# Patient Record
Sex: Female | Born: 1985 | Race: Black or African American | Hispanic: No | Marital: Single | Smoking: Never smoker
Health system: Southern US, Community
[De-identification: ages and names within clinical notes are randomized; demographics above are authoritative.]

## PROBLEM LIST (undated history)

## (undated) ENCOUNTER — Inpatient Hospital Stay (HOSPITAL_COMMUNITY): Payer: Self-pay

## (undated) DIAGNOSIS — Z5189 Encounter for other specified aftercare: Secondary | ICD-10-CM

## (undated) DIAGNOSIS — G4730 Sleep apnea, unspecified: Secondary | ICD-10-CM

## (undated) DIAGNOSIS — D649 Anemia, unspecified: Secondary | ICD-10-CM

## (undated) HISTORY — DX: Morbid (severe) obesity due to excess calories: E66.01

## (undated) HISTORY — DX: Anemia, unspecified: D64.9

## (undated) HISTORY — DX: Sleep apnea, unspecified: G47.30

---
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
## 2006-02-07 ENCOUNTER — Emergency Department (HOSPITAL_COMMUNITY): Admission: EM | Admit: 2006-02-07 | Discharge: 2006-02-07 | Payer: Self-pay | Admitting: Family Medicine

## 2006-05-27 ENCOUNTER — Emergency Department (HOSPITAL_COMMUNITY): Admission: EM | Admit: 2006-05-27 | Discharge: 2006-05-27 | Payer: Self-pay | Admitting: Family Medicine

## 2007-04-16 ENCOUNTER — Emergency Department (HOSPITAL_COMMUNITY): Admission: EM | Admit: 2007-04-16 | Discharge: 2007-04-16 | Payer: Self-pay | Admitting: Emergency Medicine

## 2007-08-01 ENCOUNTER — Encounter (HOSPITAL_COMMUNITY): Admission: RE | Admit: 2007-08-01 | Discharge: 2007-10-30 | Payer: Self-pay | Admitting: Family Medicine

## 2008-04-23 ENCOUNTER — Inpatient Hospital Stay (HOSPITAL_COMMUNITY): Admission: AD | Admit: 2008-04-23 | Discharge: 2008-04-23 | Payer: Self-pay | Admitting: Obstetrics and Gynecology

## 2008-04-25 ENCOUNTER — Encounter (INDEPENDENT_AMBULATORY_CARE_PROVIDER_SITE_OTHER): Payer: Self-pay | Admitting: Obstetrics and Gynecology

## 2008-04-25 ENCOUNTER — Inpatient Hospital Stay (HOSPITAL_COMMUNITY): Admission: AD | Admit: 2008-04-25 | Discharge: 2008-04-25 | Payer: Self-pay | Admitting: Obstetrics and Gynecology

## 2008-10-01 ENCOUNTER — Inpatient Hospital Stay (HOSPITAL_COMMUNITY): Admission: AD | Admit: 2008-10-01 | Discharge: 2008-10-01 | Payer: Self-pay | Admitting: Obstetrics and Gynecology

## 2008-12-07 ENCOUNTER — Emergency Department (HOSPITAL_COMMUNITY): Admission: EM | Admit: 2008-12-07 | Discharge: 2008-12-07 | Payer: Self-pay | Admitting: Family Medicine

## 2009-01-31 ENCOUNTER — Inpatient Hospital Stay (HOSPITAL_COMMUNITY): Admission: AD | Admit: 2009-01-31 | Discharge: 2009-01-31 | Payer: Self-pay | Admitting: Obstetrics and Gynecology

## 2009-10-31 ENCOUNTER — Ambulatory Visit (HOSPITAL_BASED_OUTPATIENT_CLINIC_OR_DEPARTMENT_OTHER): Admission: RE | Admit: 2009-10-31 | Discharge: 2009-10-31 | Payer: Self-pay | Admitting: Family Medicine

## 2009-11-02 ENCOUNTER — Ambulatory Visit: Payer: Self-pay | Admitting: Internal Medicine

## 2010-01-16 ENCOUNTER — Ambulatory Visit (HOSPITAL_BASED_OUTPATIENT_CLINIC_OR_DEPARTMENT_OTHER): Admission: RE | Admit: 2010-01-16 | Discharge: 2010-01-16 | Payer: Self-pay | Admitting: Family Medicine

## 2010-01-18 ENCOUNTER — Ambulatory Visit: Payer: Self-pay | Admitting: Internal Medicine

## 2010-05-29 ENCOUNTER — Inpatient Hospital Stay (HOSPITAL_COMMUNITY)
Admission: AD | Admit: 2010-05-29 | Discharge: 2010-05-29 | Payer: Self-pay | Source: Home / Self Care | Admitting: Obstetrics & Gynecology

## 2010-07-06 LAB — HM PAP SMEAR: HM Pap smear: NORMAL

## 2010-07-27 ENCOUNTER — Encounter: Payer: Self-pay | Admitting: Family Medicine

## 2010-09-16 LAB — URINALYSIS, ROUTINE W REFLEX MICROSCOPIC
Bilirubin Urine: NEGATIVE
Glucose, UA: NEGATIVE mg/dL
Ketones, ur: NEGATIVE mg/dL
Nitrite: NEGATIVE
Protein, ur: NEGATIVE mg/dL
Specific Gravity, Urine: 1.025 (ref 1.005–1.030)
Urobilinogen, UA: 0.2 mg/dL (ref 0.0–1.0)
pH: 6 (ref 5.0–8.0)

## 2010-09-16 LAB — URINE MICROSCOPIC-ADD ON

## 2010-09-16 LAB — POCT PREGNANCY, URINE: Preg Test, Ur: NEGATIVE

## 2010-10-12 LAB — URINALYSIS, ROUTINE W REFLEX MICROSCOPIC
Glucose, UA: NEGATIVE mg/dL
Leukocytes, UA: NEGATIVE
Protein, ur: 30 mg/dL — AB
Specific Gravity, Urine: 1.02 (ref 1.005–1.030)
pH: 6 (ref 5.0–8.0)

## 2010-10-12 LAB — POCT PREGNANCY, URINE: Preg Test, Ur: NEGATIVE

## 2010-10-12 LAB — GC/CHLAMYDIA PROBE AMP, GENITAL: GC Probe Amp, Genital: NEGATIVE

## 2010-10-12 LAB — URINE MICROSCOPIC-ADD ON

## 2010-10-13 LAB — POCT PREGNANCY, URINE: Preg Test, Ur: NEGATIVE

## 2010-10-16 LAB — URINALYSIS, ROUTINE W REFLEX MICROSCOPIC
Leukocytes, UA: NEGATIVE
Nitrite: NEGATIVE
Specific Gravity, Urine: 1.02 (ref 1.005–1.030)
Urobilinogen, UA: 0.2 mg/dL (ref 0.0–1.0)

## 2010-10-16 LAB — GC/CHLAMYDIA PROBE AMP, GENITAL
Chlamydia, DNA Probe: NEGATIVE
GC Probe Amp, Genital: NEGATIVE

## 2010-10-16 LAB — URINE MICROSCOPIC-ADD ON

## 2010-10-16 LAB — URINE CULTURE

## 2010-10-16 LAB — WET PREP, GENITAL: Yeast Wet Prep HPF POC: NONE SEEN

## 2011-03-26 LAB — CROSSMATCH

## 2011-04-07 LAB — CBC
HCT: 38.2
Hemoglobin: 11.4 — ABNORMAL LOW
MCV: 66.8 — ABNORMAL LOW
Platelets: 372
RBC: 5.72 — ABNORMAL HIGH
WBC: 14.7 — ABNORMAL HIGH

## 2011-04-07 LAB — TYPE AND SCREEN: Antibody Screen: NEGATIVE

## 2011-04-07 LAB — HCG, QUANTITATIVE, PREGNANCY: hCG, Beta Chain, Quant, S: 96 — ABNORMAL HIGH

## 2011-04-07 LAB — ABO/RH: ABO/RH(D): B POS

## 2011-04-24 ENCOUNTER — Inpatient Hospital Stay (HOSPITAL_COMMUNITY)
Admission: AD | Admit: 2011-04-24 | Discharge: 2011-04-25 | Disposition: A | Discharge status: Home / Self Care | Payer: Self-pay | Source: Ambulatory Visit | Attending: Obstetrics & Gynecology | Admitting: Obstetrics & Gynecology

## 2011-04-24 ENCOUNTER — Encounter (HOSPITAL_COMMUNITY): Payer: Self-pay | Admitting: Obstetrics and Gynecology

## 2011-04-24 DIAGNOSIS — B373 Candidiasis of vulva and vagina: Secondary | ICD-10-CM

## 2011-04-24 DIAGNOSIS — B3731 Acute candidiasis of vulva and vagina: Secondary | ICD-10-CM | POA: Insufficient documentation

## 2011-04-24 DIAGNOSIS — R109 Unspecified abdominal pain: Secondary | ICD-10-CM | POA: Insufficient documentation

## 2011-04-24 DIAGNOSIS — R079 Chest pain, unspecified: Secondary | ICD-10-CM

## 2011-04-24 DIAGNOSIS — M549 Dorsalgia, unspecified: Secondary | ICD-10-CM

## 2011-04-24 DIAGNOSIS — N390 Urinary tract infection, site not specified: Secondary | ICD-10-CM | POA: Insufficient documentation

## 2011-04-24 MED ORDER — GI COCKTAIL ~~LOC~~
30.0000 mL | Freq: Once | ORAL | Status: AC
  Administered 2011-04-24: 30 mL via ORAL
  Filled 2011-04-24: qty 30

## 2011-04-24 MED ORDER — KETOROLAC TROMETHAMINE 60 MG/2ML IM SOLN
60.0000 mg | Freq: Once | INTRAMUSCULAR | Status: AC
  Administered 2011-04-24: 60 mg via INTRAMUSCULAR
  Filled 2011-04-24: qty 2

## 2011-04-24 NOTE — Progress Notes (Signed)
Pt states, " I started having lower back pain about 2 weeks, and it got worse on Tues and I couldn't walk. I am peeing more frequently but it doesn't hurt like a UTI. I've had low abdominal pain for a month or so and I've had some nausea. Last Sat I had my arms in the air fixing my hair and then lowered them I felt a sharp cramp pain in my chest (left center). This pain has continued all week but worse."

## 2011-04-24 NOTE — ED Provider Notes (Signed)
History     Chief Complaint  Patient presents with  . Back Pain  . Abdominal Pain  . Chest Pain   HPI Pt is not pregnant and complains of lower abdominal cramping for about 1 month and lower back pain for about 1 week and then epigastric pain radiating around her upper left abdomen about 2 days.  She denies fever, pain with urination.  She has constipation and diarrhea for about 2 weeks.  She took Tylenol yesterday for a headache, which relieved the headache but didn't help the cramping or back pain or epigastric pain.  She had pizza at 5:30pm.  She had a soft bowel movement yesterday.  She has irregular periods.  She describes the abdominal pain as spasms.  She has some nausea.  Pt states she has a history of negative urine pregnancy test with positive blood test and wants to be checked for pregnancy.   Past Medical History  Diagnosis Date  . No pertinent past medical history     Past Surgical History  Procedure Date  . No past surgeries     No family history on file.  History  Substance Use Topics  . Smoking status: Never Smoker   . Smokeless tobacco: Not on file  . Alcohol Use: No    Allergies: No Known Allergies  No prescriptions prior to admission    Review of Systems  Constitutional: Negative for fever and chills.  Cardiovascular: Positive for chest pain. Negative for palpitations and orthopnea.  Gastrointestinal: Positive for nausea, abdominal pain, diarrhea and constipation. Negative for vomiting.  Genitourinary: Positive for frequency. Negative for dysuria, urgency and hematuria.  Neurological: Positive for headaches. Negative for dizziness.   Physical Exam   Blood pressure 143/89, pulse 102, temperature 99.6 F (37.6 C), resp. rate 20, height 5' 9.75" (1.772 m), weight 418 lb (189.604 kg), last menstrual period 02/13/2011, SpO2 96.00%.  Physical Exam  Constitutional: She is oriented to person, place, and time. She appears well-developed and well-nourished.         Massively obese at 418 lbs.  Eyes: Pupils are equal, round, and reactive to light.  Neck: Normal range of motion. Neck supple.  Cardiovascular: Normal rate, regular rhythm and normal heart sounds.   Respiratory: Effort normal and breath sounds normal. No respiratory distress.  GI: Soft. She exhibits no distension. There is no tenderness. There is no rebound and no guarding.  Genitourinary:       Reddened vaginal mucosa with small amount of clumpy white discharge in vault; cervix clean NT; uterus NSSC nontender; pain not replicated with exam  Musculoskeletal: Normal range of motion.  Neurological: She is alert and oriented to person, place, and time.  Skin: Skin is warm and dry.  Psychiatric: She has a normal mood and affect.    MAU Course  Procedures Exam Wet prep-yeast GC/chlamydia-pending   Assessment and Plan  Yeast vaginitis- Diflucan 150mg  #2 UTI- MAcrobid #10 one BID for 5 days F/u with PCP- recommend [facility]  Amirra Herling 04/24/2011, 11:02 PM

## 2011-04-24 NOTE — Progress Notes (Signed)
"  Lower RT back, lower abd pain, chest pain.  The abd pain started about 1 month ago.  The lower back has been hurting for about 2 weeks.  The chest pain has been hurting for about 2 days and is getting worse.  I feel nauseated.  I haven't thrown up.  It has come up in my throat, but nothing has ever come out all the way.  I had dizziness for about 30 mins the other day, but that was it."

## 2011-04-25 LAB — CBC
HCT: 35.2 % — ABNORMAL LOW (ref 36.0–46.0)
Hemoglobin: 10.2 g/dL — ABNORMAL LOW (ref 12.0–15.0)
MCHC: 29 g/dL — ABNORMAL LOW (ref 30.0–36.0)
MCV: 68.9 fL — ABNORMAL LOW (ref 78.0–100.0)

## 2011-04-25 LAB — HCG, SERUM, QUALITATIVE: Preg, Serum: NEGATIVE

## 2011-04-25 LAB — GC/CHLAMYDIA PROBE AMP, GENITAL: Chlamydia, DNA Probe: NEGATIVE

## 2011-04-25 LAB — WET PREP, GENITAL: Trich, Wet Prep: NONE SEEN

## 2011-04-25 MED ORDER — FLUCONAZOLE 150 MG PO TABS: 150.0000 mg | ORAL_TABLET | Freq: Once | ORAL | Status: AC

## 2011-04-25 MED ORDER — NITROFURANTOIN MONOHYD MACRO 100 MG PO CAPS: 100.0000 mg | ORAL_CAPSULE | Freq: Two times a day (BID) | ORAL | Status: AC

## 2011-04-25 MED ORDER — OXYCODONE-ACETAMINOPHEN 5-325 MG PO TABS: 2.0000 | ORAL_TABLET | ORAL | Status: AC | PRN

## 2011-04-26 ENCOUNTER — Inpatient Hospital Stay (INDEPENDENT_AMBULATORY_CARE_PROVIDER_SITE_OTHER)
Admission: RE | Admit: 2011-04-26 | Discharge: 2011-04-26 | Disposition: A | Discharge status: Home / Self Care | Payer: Self-pay | Source: Ambulatory Visit | Attending: Emergency Medicine | Admitting: Emergency Medicine

## 2011-04-26 DIAGNOSIS — K219 Gastro-esophageal reflux disease without esophagitis: Secondary | ICD-10-CM

## 2011-04-26 DIAGNOSIS — R079 Chest pain, unspecified: Secondary | ICD-10-CM

## 2011-05-02 ENCOUNTER — Emergency Department (HOSPITAL_COMMUNITY)
Admission: EM | Admit: 2011-05-02 | Discharge: 2011-05-03 | Disposition: A | Discharge status: Home / Self Care | Payer: Self-pay | Attending: Emergency Medicine | Admitting: Emergency Medicine

## 2011-05-02 DIAGNOSIS — R42 Dizziness and giddiness: Secondary | ICD-10-CM | POA: Insufficient documentation

## 2011-05-02 DIAGNOSIS — R300 Dysuria: Secondary | ICD-10-CM | POA: Insufficient documentation

## 2011-05-02 DIAGNOSIS — R51 Headache: Secondary | ICD-10-CM | POA: Insufficient documentation

## 2011-05-02 DIAGNOSIS — R112 Nausea with vomiting, unspecified: Secondary | ICD-10-CM | POA: Insufficient documentation

## 2011-05-02 DIAGNOSIS — R0609 Other forms of dyspnea: Secondary | ICD-10-CM | POA: Insufficient documentation

## 2011-05-02 DIAGNOSIS — R0989 Other specified symptoms and signs involving the circulatory and respiratory systems: Secondary | ICD-10-CM | POA: Insufficient documentation

## 2011-05-02 LAB — URINALYSIS, ROUTINE W REFLEX MICROSCOPIC
Bilirubin Urine: NEGATIVE
Glucose, UA: NEGATIVE mg/dL
Hgb urine dipstick: NEGATIVE
Ketones, ur: NEGATIVE mg/dL
Leukocytes, UA: NEGATIVE
pH: 8 (ref 5.0–8.0)

## 2011-05-02 LAB — POCT I-STAT, CHEM 8
Glucose, Bld: 88 mg/dL (ref 70–99)
HCT: 39 % (ref 36.0–46.0)
Hemoglobin: 13.3 g/dL (ref 12.0–15.0)
Potassium: 3.8 mEq/L (ref 3.5–5.1)
Sodium: 140 mEq/L (ref 135–145)

## 2011-05-02 LAB — URINE MICROSCOPIC-ADD ON

## 2011-05-02 LAB — CBC
MCH: 19.7 pg — ABNORMAL LOW (ref 26.0–34.0)
MCHC: 29.1 g/dL — ABNORMAL LOW (ref 30.0–36.0)
Platelets: 354 10*3/uL (ref 150–400)
RBC: 5.33 MIL/uL — ABNORMAL HIGH (ref 3.87–5.11)

## 2011-05-03 LAB — DIFFERENTIAL
Basophils Absolute: 0 10*3/uL (ref 0.0–0.1)
Lymphs Abs: 2.1 10*3/uL (ref 0.7–4.0)
Monocytes Absolute: 0.6 10*3/uL (ref 0.1–1.0)
Monocytes Relative: 5 % (ref 3–12)
Neutrophils Relative %: 68 % (ref 43–77)

## 2011-05-08 NOTE — ED Provider Notes (Signed)
Agree with above note.  [name] H. 05/08/2011 2:07 AM

## 2011-11-12 ENCOUNTER — Encounter (HOSPITAL_COMMUNITY): Payer: Self-pay | Admitting: *Deleted

## 2011-11-12 ENCOUNTER — Emergency Department (INDEPENDENT_AMBULATORY_CARE_PROVIDER_SITE_OTHER)
Admission: EM | Admit: 2011-11-12 | Discharge: 2011-11-12 | Disposition: A | Discharge status: Home / Self Care | Payer: Self-pay | Source: Home / Self Care | Attending: Family Medicine | Admitting: Family Medicine

## 2011-11-12 DIAGNOSIS — M549 Dorsalgia, unspecified: Secondary | ICD-10-CM

## 2011-11-12 LAB — POCT PREGNANCY, URINE: Preg Test, Ur: NEGATIVE

## 2011-11-12 LAB — POCT URINALYSIS DIP (DEVICE)
Glucose, UA: NEGATIVE mg/dL
Nitrite: NEGATIVE
Urobilinogen, UA: 0.2 mg/dL (ref 0.0–1.0)

## 2011-11-12 MED ORDER — IBUPROFEN 800 MG PO TABS: 800.0000 mg | ORAL_TABLET | Freq: Three times a day (TID) | ORAL | Status: AC

## 2011-11-12 MED ORDER — CYCLOBENZAPRINE HCL 10 MG PO TABS: 10.0000 mg | ORAL_TABLET | Freq: Two times a day (BID) | ORAL | Status: AC | PRN

## 2011-11-12 NOTE — Discharge Instructions (Signed)
Back Pain, Adult Low back pain is very common. About 1 in 5 people have back pain.The cause of low back pain is rarely dangerous. The pain often gets better over time.About half of people with a sudden onset of back pain feel better in just 2 weeks. About 8 in 10 people feel better by 6 weeks.  CAUSES Some common causes of back pain include:  Strain of the muscles or ligaments supporting the spine.   Wear and tear (degeneration) of the spinal discs.   Arthritis.   Direct injury to the back.  DIAGNOSIS Most of the time, the direct cause of low back pain is not known.However, back pain can be treated effectively even when the exact cause of the pain is unknown.Answering your caregiver's questions about your overall health and symptoms is one of the most accurate ways to make sure the cause of your pain is not dangerous. If your caregiver needs more information, he or she may order lab work or imaging tests (X-rays or MRIs).However, even if imaging tests show changes in your back, this usually does not require surgery. HOME CARE INSTRUCTIONS For many people, back pain returns.Since low back pain is rarely dangerous, it is often a condition that people can learn to manageon their own.   Remain active. It is stressful on the back to sit or stand in one place. Do not sit, drive, or stand in one place for more than 30 minutes at a time. Take short walks on level surfaces as soon as pain allows.Try to increase the length of time you walk each day.   Do not stay in bed.Resting more than 1 or 2 days can delay your recovery.   Do not avoid exercise or work.Your body is made to move.It is not dangerous to be active, even though your back may hurt.Your back will likely heal faster if you return to being active before your pain is gone.   Pay attention to your body when you bend and lift. Many people have less discomfortwhen lifting if they bend their knees, keep the load close to their  bodies,and avoid twisting. Often, the most comfortable positions are those that put less stress on your recovering back.   Find a comfortable position to sleep. Use a firm mattress and lie on your side with your knees slightly bent. If you lie on your back, put a pillow under your knees.   Only take over-the-counter or prescription medicines as directed by your caregiver. Over-the-counter medicines to reduce pain and inflammation are often the most helpful.Your caregiver may prescribe muscle relaxant drugs.These medicines help dull your pain so you can more quickly return to your normal activities and healthy exercise.   Put ice on the injured area.   Put ice in a plastic bag.   Place a towel between your skin and the bag.   Leave the ice on for 15 to 20 minutes, 3 to 4 times a day for the first 2 to 3 days. After that, ice and heat may be alternated to reduce pain and spasms.   Ask your caregiver about trying back exercises and gentle massage. This may be of some benefit.   Avoid feeling anxious or stressed.Stress increases muscle tension and can worsen back pain.It is important to recognize when you are anxious or stressed and learn ways to manage it.Exercise is a great option.  SEEK MEDICAL CARE IF:  You have pain that is not relieved with rest or medicine.   You have   pain that does not improve in 1 week.   You have new symptoms.   You are generally not feeling well.  SEEK IMMEDIATE MEDICAL CARE IF:   You have pain that radiates from your back into your legs.   You develop new bowel or bladder control problems.   You have unusual weakness or numbness in your arms or legs.   You develop nausea or vomiting.   You develop abdominal pain.   You feel faint.  Document Released: 06/22/2005 Document Revised: 06/11/2011 Document Reviewed: 11/10/2010 [employer] Patient Information 2012 [employer]. 

## 2011-11-12 NOTE — ED Notes (Signed)
Pt   Reports  r  Sided  Back / flank pain radiating to  r  Side  Of  abd    For  What  She  Describes  As  A  Long  Time  -  She  Reports  Worse  Last  sev  Days        Pt  denys  Any vaginal  Discharge  Had  Perhaps  sm  Spotting       -  She  Ambulates  With a  Steady  Slow  Fluid  Gate    She  Is  Sitting  Upright on  Exam table  Speaking in  Complete  sentances  In no  Severe  Distress

## 2011-11-12 NOTE — ED Provider Notes (Signed)
History     CSN: [id number]  Arrival date & time 11/12/11  4098   First MD Initiated Contact with Patient 11/12/11 1935      Chief Complaint  Patient presents with  . Back Pain    (Consider location/radiation/quality/duration/timing/severity/associated sxs/prior treatment) Patient is a 26 y.o. female presenting with back pain. The history is provided by the patient. No language interpreter was used.  Back Pain  This is a new problem. Episode onset: august. The problem occurs every several days. The problem has been gradually worsening. The pain is associated with no known injury. The pain is present in the lumbar spine. The quality of the pain is described as aching. The pain is at a severity of 6/10. The pain is moderate. The pain is the same all the time. Stiffness is present all day. She has tried nothing for the symptoms.   Pt reports pain on and off.   Pt complains of difficulty walking.   Pt reports pain comes and goes.   Pt reports pain radiates around to abdomen on both sides Past Medical History  Diagnosis Date  . No pertinent past medical history     Past Surgical History  Procedure Date  . No past surgeries     No family history on file.  History  Substance Use Topics  . Smoking status: Never Smoker   . Smokeless tobacco: Not on file  . Alcohol Use: No    OB History    Grav Para Term Preterm Abortions TAB SAB Ect Mult Living   1    1  1    0      Review of Systems  Musculoskeletal: Positive for back pain.  All other systems reviewed and are negative.    Allergies  Review of patient's allergies indicates no known allergies.  Home Medications  No current outpatient prescriptions on file.  BP 145/91  Pulse 104  Temp(Src) 98.6 F (37 C) (Oral)  Resp 20  SpO2 98%  LMP 08/15/2011  Physical Exam  Nursing note and vitals reviewed. Constitutional: She appears well-developed and well-nourished.  HENT:  Head: Normocephalic.  Cardiovascular: Normal  rate.   Pulmonary/Chest: Effort normal and breath sounds normal.  Abdominal: Soft.  Musculoskeletal: She exhibits tenderness.       Diffuse  Tenderness  back,  Range of motion limited by body habitus,    Neurological: She is alert.  Skin: Skin is warm.    ED Course  Procedures (including critical care time)  Labs Reviewed  POCT URINALYSIS DIP (DEVICE) - Abnormal; Notable for the following:    Hgb urine dipstick LARGE (*)    All other components within normal limits  POCT PREGNANCY, URINE   No results found.   No diagnosis found.    MDM     RX ibuprofen.   Schedule to see Dr. Lajoyce Corners for evaluation of back pain.   Follow up with [place] [facility] as scheduled     Elson Areas, [place] 11/12/11 2007

## 2011-11-13 NOTE — ED Provider Notes (Signed)
Medical screening examination/treatment/procedure(s) were performed by resident physician or non-physician practitioner and as supervising physician I was immediately available for consultation/collaboration.   Kensie Susman DOUGLAS MD.    Maki Sweetser D Deshunda Thackston, MD 11/13/11 1220 

## 2011-12-04 ENCOUNTER — Ambulatory Visit (INDEPENDENT_AMBULATORY_CARE_PROVIDER_SITE_OTHER): Payer: BC Managed Care – PPO | Admitting: Internal Medicine

## 2011-12-04 ENCOUNTER — Encounter: Payer: Self-pay | Admitting: Internal Medicine

## 2011-12-04 ENCOUNTER — Other Ambulatory Visit (INDEPENDENT_AMBULATORY_CARE_PROVIDER_SITE_OTHER): Payer: BC Managed Care – PPO

## 2011-12-04 VITALS — BP 118/76 | HR 90 | Temp 98.5°F | Resp 20 | Ht 69.0 in | Wt >= 6400 oz

## 2011-12-04 DIAGNOSIS — D649 Anemia, unspecified: Secondary | ICD-10-CM

## 2011-12-04 DIAGNOSIS — Z0000 Encounter for general adult medical examination without abnormal findings: Secondary | ICD-10-CM | POA: Insufficient documentation

## 2011-12-04 DIAGNOSIS — D509 Iron deficiency anemia, unspecified: Secondary | ICD-10-CM | POA: Insufficient documentation

## 2011-12-04 LAB — CBC WITH DIFFERENTIAL/PLATELET
Basophils Absolute: 0.1 10*3/uL (ref 0.0–0.1)
Eosinophils Absolute: 0.6 10*3/uL (ref 0.0–0.7)
Lymphocytes Relative: 22.4 % (ref 12.0–46.0)
MCHC: 30.7 g/dL (ref 30.0–36.0)
Neutrophils Relative %: 64.4 % (ref 43.0–77.0)
RDW: 19.5 % — ABNORMAL HIGH (ref 11.5–14.6)

## 2011-12-04 LAB — URINALYSIS, ROUTINE W REFLEX MICROSCOPIC
Nitrite: NEGATIVE
Total Protein, Urine: 30
Urine Glucose: NEGATIVE
pH: 6 (ref 5.0–8.0)

## 2011-12-04 LAB — COMPREHENSIVE METABOLIC PANEL
ALT: 17 U/L (ref 0–35)
AST: 14 U/L (ref 0–37)
CO2: 27 mEq/L (ref 19–32)
Creatinine, Ser: 0.7 mg/dL (ref 0.4–1.2)
GFR: 134.08 mL/min (ref >60.00)
Total Bilirubin: 0.3 mg/dL (ref 0.3–1.2)

## 2011-12-04 LAB — FERRITIN: Ferritin: 5.1 ng/mL — ABNORMAL LOW (ref 10.0–291.0)

## 2011-12-04 LAB — LIPID PANEL
Cholesterol: 147 mg/dL (ref 0–200)
LDL Cholesterol: 86 mg/dL (ref 0–99)
Triglycerides: 90 mg/dL (ref 0.0–149.0)
VLDL: 18 mg/dL (ref 0.0–40.0)

## 2011-12-04 LAB — LIPASE: Lipase: 19 U/L (ref 11.0–59.0)

## 2011-12-04 LAB — FOLATE: Folate: 13.4 ng/mL (ref >5.9)

## 2011-12-04 NOTE — Patient Instructions (Signed)
Anemia, Nonspecific Your exam and blood tests show you are anemic. This means your blood (hemoglobin) level is low. Normal hemoglobin values are 12 to 15 g/dL for females and 14 to 17 g/dL for males. Make a note of your hemoglobin level today. The hematocrit percent is also used to measure anemia. A normal hematocrit is 38% to 46% in females and 42% to 49% in males. Make a note of your hematocrit level today. CAUSES  Anemia can be due to many different causes.  Excessive bleeding from periods (in women).   Intestinal bleeding.   Poor nutrition.   Kidney, thyroid, liver, and bone marrow diseases.  SYMPTOMS  Anemia can come on suddenly (acute). It can also come on slowly. Symptoms can include:  Minor weakness.   Dizziness.   Palpitations.   Shortness of breath.  Symptoms may be absent until half your hemoglobin is missing if it comes on slowly. Anemia due to acute blood loss from an injury or internal bleeding may require blood transfusion if the loss is severe. Hospital care is needed if you are anemic and there is significant continual blood loss. TREATMENT   Stool tests for blood (Hemoccult) and additional lab tests are often needed. This determines the best treatment.   Further checking on your condition and your response to treatment is very important. It often takes many weeks to correct anemia.  Depending on the cause, treatment can include:  Supplements of iron.   Vitamins B12 and folic acid.   Hormone medicines.If your anemia is due to bleeding, finding the cause of the blood loss is very important. This will help avoid further problems.  SEEK IMMEDIATE MEDICAL CARE IF:   You develop fainting, extreme weakness, shortness of breath, or chest pain.   You develop heavy vaginal bleeding.   You develop bloody or black, tarry stools or vomit up blood.   You develop a high fever, rash, repeated vomiting, or dehydration.  Document Released: 07/30/2004 Document Revised:  06/11/2011 Document Reviewed: 05/07/2009 [facility] Patient Information 2012 [place], [place].Preventive Care for Adults, Female A healthy lifestyle and preventive care can promote health and wellness. Preventive health guidelines for women include the following key practices.  A routine yearly physical is a good way to check with your caregiver about your health and preventive screening. It is a chance to share any concerns and updates on your health, and to receive a thorough exam.   Visit your dentist for a routine exam and preventive care every 6 months. Brush your teeth twice a day and floss once a day. Good oral hygiene prevents tooth decay and gum disease.   The frequency of eye exams is based on your age, health, family medical history, use of contact lenses, and other factors. Follow your caregiver's recommendations for frequency of eye exams.   Eat a healthy diet. Foods like vegetables, fruits, whole grains, low-fat dairy products, and lean protein foods contain the nutrients you need without too many calories. Decrease your intake of foods high in solid fats, added sugars, and salt. Eat the right amount of calories for you.Get information about a proper diet from your caregiver, if necessary.   Regular physical exercise is one of the most important things you can do for your health. Most adults should get at least 150 minutes of moderate-intensity exercise (any activity that increases your heart rate and causes you to sweat) each week. In addition, most adults need muscle-strengthening exercises on 2 or more days a week.   Maintain  a healthy weight. The body mass index (BMI) is a screening tool to identify possible weight problems. It provides an estimate of body fat based on height and weight. Your caregiver can help determine your BMI, and can help you achieve or maintain a healthy weight.For adults 20 years and older:   A BMI below 18.5 is considered underweight.   A BMI of 18.5 to  24.9 is normal.   A BMI of 25 to 29.9 is considered overweight.   A BMI of 30 and above is considered obese.   Maintain normal blood lipids and cholesterol levels by exercising and minimizing your intake of saturated fat. Eat a balanced diet with plenty of fruit and vegetables. Blood tests for lipids and cholesterol should begin at age 25 and be repeated every 5 years. If your lipid or cholesterol levels are high, you are over 50, or you are at high risk for heart disease, you may need your cholesterol levels checked more frequently.Ongoing high lipid and cholesterol levels should be treated with medicines if diet and exercise are not effective.   If you smoke, find out from your caregiver how to quit. If you do not use tobacco, do not start.   If you are pregnant, do not drink alcohol. If you are breastfeeding, be very cautious about drinking alcohol. If you are not pregnant and choose to drink alcohol, do not exceed 1 drink per day. One drink is considered to be 12 ounces (355 mL) of beer, 5 ounces (148 mL) of wine, or 1.5 ounces (44 mL) of liquor.   Avoid use of street drugs. Do not share needles with anyone. Ask for help if you need support or instructions about stopping the use of drugs.   High blood pressure causes heart disease and increases the risk of stroke. Your blood pressure should be checked at least every 1 to 2 years. Ongoing high blood pressure should be treated with medicines if weight loss and exercise are not effective.   If you are 55 to 26 years old, ask your caregiver if you should take aspirin to prevent strokes.   Diabetes screening involves taking a blood sample to check your fasting blood sugar level. This should be done once every 3 years, after age 64, if you are within normal weight and without risk factors for diabetes. Testing should be considered at a younger age or be carried out more frequently if you are overweight and have at least 1 risk factor for diabetes.     Breast cancer screening is essential preventive care for women. You should practice "breast self-awareness." This means understanding the normal appearance and feel of your breasts and may include breast self-examination. Any changes detected, no matter how small, should be reported to a caregiver. Women in their 53s and 30s should have a clinical breast exam (CBE) by a caregiver as part of a regular health exam every 1 to 3 years. After age 43, women should have a CBE every year. Starting at age 27, women should consider having a mammography (breast X-ray test) every year. Women who have a family history of breast cancer should talk to their caregiver about genetic screening. Women at a high risk of breast cancer should talk to their caregivers about having magnetic resonance imaging (MRI) and a mammography every year.   The Pap test is a screening test for cervical cancer. A Pap test can show cell changes on the cervix that might become cervical cancer if left untreated. A  Pap test is a procedure in which cells are obtained and examined from the lower end of the uterus (cervix).   Women should have a Pap test starting at age 21.   Between ages 13 and 109, Pap tests should be repeated every 2 years.   Beginning at age 80, you should have a Pap test every 3 years as long as the past 3 Pap tests have been normal.   Some women have medical problems that increase the chance of getting cervical cancer. Talk to your caregiver about these problems. It is especially important to talk to your caregiver if a new problem develops soon after your last Pap test. In these cases, your caregiver may recommend more frequent screening and Pap tests.   The above recommendations are the same for women who have or have not gotten the vaccine for human papillomavirus (HPV).   If you had a hysterectomy for a problem that was not cancer or a condition that could lead to cancer, then you no longer need Pap tests. Even if  you no longer need a Pap test, a regular exam is a good idea to make sure no other problems are starting.   If you are between ages 71 and 31, and you have had normal Pap tests going back 10 years, you no longer need Pap tests. Even if you no longer need a Pap test, a regular exam is a good idea to make sure no other problems are starting.   If you have had past treatment for cervical cancer or a condition that could lead to cancer, you need Pap tests and screening for cancer for at least 20 years after your treatment.   If Pap tests have been discontinued, risk factors (such as a new sexual partner) need to be reassessed to determine if screening should be resumed.   The HPV test is an additional test that may be used for cervical cancer screening. The HPV test looks for the virus that can cause the cell changes on the cervix. The cells collected during the Pap test can be tested for HPV. The HPV test could be used to screen women aged 63 years and older, and should be used in women of any age who have unclear Pap test results. After the age of 40, women should have HPV testing at the same frequency as a Pap test.   Colorectal cancer can be detected and often prevented. Most routine colorectal cancer screening begins at the age of 76 and continues through age 41. However, your caregiver may recommend screening at an earlier age if you have risk factors for colon cancer. On a yearly basis, your caregiver may provide home test kits to check for hidden blood in the stool. Use of a small camera at the end of a tube, to directly examine the colon (sigmoidoscopy or colonoscopy), can detect the earliest forms of colorectal cancer. Talk to your caregiver about this at age 12, when routine screening begins. Direct examination of the colon should be repeated every 5 to 10 years through age 63, unless early forms of pre-cancerous polyps or small growths are found.   Hepatitis C blood testing is recommended for  all people born from [birth year] through 1965 and any individual with known risks for hepatitis C.   Practice safe sex. Use condoms and avoid high-risk sexual practices to reduce the spread of sexually transmitted infections (STIs). STIs include gonorrhea, chlamydia, syphilis, trichomonas, herpes, HPV, and human immunodeficiency virus (HIV). Herpes,  HIV, and HPV are viral illnesses that have no cure. They can result in disability, cancer, and death. Sexually active women aged 70 and younger should be checked for chlamydia. Older women with new or multiple partners should also be tested for chlamydia. Testing for other STIs is recommended if you are sexually active and at increased risk.   Osteoporosis is a disease in which the bones lose minerals and strength with aging. This can result in serious bone fractures. The risk of osteoporosis can be identified using a bone density scan. Women ages 89 and over and women at risk for fractures or osteoporosis should discuss screening with their caregivers. Ask your caregiver whether you should take a calcium supplement or vitamin D to reduce the rate of osteoporosis.   Menopause can be associated with physical symptoms and risks. Hormone replacement therapy is available to decrease symptoms and risks. You should talk to your caregiver about whether hormone replacement therapy is right for you.   Use sunscreen with sun protection factor (SPF) of 30 or more. Apply sunscreen liberally and repeatedly throughout the day. You should seek shade when your shadow is shorter than you. Protect yourself by wearing long sleeves, pants, a wide-brimmed hat, and sunglasses year round, whenever you are outdoors.   Once a month, do a whole body skin exam, using a mirror to look at the skin on your back. Notify your caregiver of new moles, moles that have irregular borders, moles that are larger than a pencil eraser, or moles that have changed in shape or color.   Stay current with  required immunizations.   Influenza. You need a dose every fall (or winter). The composition of the flu vaccine changes each year, so being vaccinated once is not enough.   Pneumococcal polysaccharide. You need 1 to 2 doses if you smoke cigarettes or if you have certain chronic medical conditions. You need 1 dose at age 9 (or older) if you have never been vaccinated.   Tetanus, diphtheria, pertussis (Tdap, Td). Get 1 dose of Tdap vaccine if you are younger than age 67, are over 74 and have contact with an infant, are a Research scientist (physical sciences), are pregnant, or simply want to be protected from whooping cough. After that, you need a Td booster dose every 10 years. Consult your caregiver if you have not had at least 3 tetanus and diphtheria-containing shots sometime in your life or have a deep or dirty wound.   HPV. You need this vaccine if you are a woman age 61 or younger. The vaccine is given in 3 doses over 6 months.   Measles, mumps, rubella (MMR). You need at least 1 dose of MMR if you were born in 1957 or later. You may also need a second dose.   Meningococcal. If you are age 33 to 61 and a first-year college student living in a residence hall, or have one of several medical conditions, you need to get vaccinated against meningococcal disease. You may also need additional booster doses.   Zoster (shingles). If you are age 48 or older, you should get this vaccine.   Varicella (chickenpox). If you have never had chickenpox or you were vaccinated but received only 1 dose, talk to your caregiver to find out if you need this vaccine.   Hepatitis A. You need this vaccine if you have a specific risk factor for hepatitis A virus infection or you simply wish to be protected from this disease. The vaccine is usually given as  2 doses, 6 to 18 months apart.   Hepatitis B. You need this vaccine if you have a specific risk factor for hepatitis B virus infection or you simply wish to be protected from this  disease. The vaccine is given in 3 doses, usually over 6 months.  Preventive Services / Frequency Ages 69 to 62  Blood pressure check.** / Every 1 to 2 years.   Lipid and cholesterol check.** / Every 5 years beginning at age 4.   Clinical breast exam.** / Every 3 years for women in their 60s and 30s.   Pap test.** / Every 2 years from ages 86 through 40. Every 3 years starting at age 109 through age 55 or 55 with a history of 3 consecutive normal Pap tests.   HPV screening.** / Every 3 years from ages 82 through ages 56 to 42 with a history of 3 consecutive normal Pap tests.   Hepatitis C blood test.** / For any individual with known risks for hepatitis C.   Skin self-exam. / Monthly.   Influenza immunization.** / Every year.   Pneumococcal polysaccharide immunization.** / 1 to 2 doses if you smoke cigarettes or if you have certain chronic medical conditions.   Tetanus, diphtheria, pertussis (Tdap, Td) immunization. / A one-time dose of Tdap vaccine. After that, you need a Td booster dose every 10 years.   HPV immunization. / 3 doses over 6 months, if you are 18 and younger.   Measles, mumps, rubella (MMR) immunization. / You need at least 1 dose of MMR if you were born in 1957 or later. You may also need a second dose.   Meningococcal immunization. / 1 dose if you are age 74 to 51 and a first-year college student living in a residence hall, or have one of several medical conditions, you need to get vaccinated against meningococcal disease. You may also need additional booster doses.   Varicella immunization.** / Consult your caregiver.   Hepatitis A immunization.** / Consult your caregiver. 2 doses, 6 to 18 months apart.   Hepatitis B immunization.** / Consult your caregiver. 3 doses usually over 6 months.  Ages 109 to 61  Blood pressure check.** / Every 1 to 2 years.   Lipid and cholesterol check.** / Every 5 years beginning at age 84.   Clinical breast exam.** / Every year  after age 50.   Mammogram.** / Every year beginning at age 50 and continuing for as long as you are in good health. Consult with your caregiver.   Pap test.** / Every 3 years starting at age 84 through age 71 or 12 with a history of 3 consecutive normal Pap tests.   HPV screening.** / Every 3 years from ages 68 through ages 43 to 78 with a history of 3 consecutive normal Pap tests.   Fecal occult blood test (FOBT) of stool. / Every year beginning at age 56 and continuing until age 63. You may not need to do this test if you get a colonoscopy every 10 years.   Flexible sigmoidoscopy or colonoscopy.** / Every 5 years for a flexible sigmoidoscopy or every 10 years for a colonoscopy beginning at age 64 and continuing until age 71.   Hepatitis C blood test.** / For all people born from [birth year] through 1965 and any individual with known risks for hepatitis C.   Skin self-exam. / Monthly.   Influenza immunization.** / Every year.   Pneumococcal polysaccharide immunization.** / 1 to 2 doses if you smoke  cigarettes or if you have certain chronic medical conditions.   Tetanus, diphtheria, pertussis (Tdap, Td) immunization.** / A one-time dose of Tdap vaccine. After that, you need a Td booster dose every 10 years.   Measles, mumps, rubella (MMR) immunization. / You need at least 1 dose of MMR if you were born in 1957 or later. You may also need a second dose.   Varicella immunization.** / Consult your caregiver.   Meningococcal immunization.** / Consult your caregiver.   Hepatitis A immunization.** / Consult your caregiver. 2 doses, 6 to 18 months apart.   Hepatitis B immunization.** / Consult your caregiver. 3 doses, usually over 6 months.  Ages 55 and over  Blood pressure check.** / Every 1 to 2 years.   Lipid and cholesterol check.** / Every 5 years beginning at age 21.   Clinical breast exam.** / Every year after age 11.   Mammogram.** / Every year beginning at age 42 and continuing for  as long as you are in good health. Consult with your caregiver.   Pap test.** / Every 3 years starting at age 70 through age 47 or 96 with a 3 consecutive normal Pap tests. Testing can be stopped between 65 and 70 with 3 consecutive normal Pap tests and no abnormal Pap or HPV tests in the past 10 years.   HPV screening.** / Every 3 years from ages 24 through ages 8 or 44 with a history of 3 consecutive normal Pap tests. Testing can be stopped between 65 and 70 with 3 consecutive normal Pap tests and no abnormal Pap or HPV tests in the past 10 years.   Fecal occult blood test (FOBT) of stool. / Every year beginning at age 39 and continuing until age 43. You may not need to do this test if you get a colonoscopy every 10 years.   Flexible sigmoidoscopy or colonoscopy.** / Every 5 years for a flexible sigmoidoscopy or every 10 years for a colonoscopy beginning at age 23 and continuing until age 15.   Hepatitis C blood test.** / For all people born from [birth year] through 1965 and any individual with known risks for hepatitis C.   Osteoporosis screening.** / A one-time screening for women ages 14 and over and women at risk for fractures or osteoporosis.   Skin self-exam. / Monthly.   Influenza immunization.** / Every year.   Pneumococcal polysaccharide immunization.** / 1 dose at age 65 (or older) if you have never been vaccinated.   Tetanus, diphtheria, pertussis (Tdap, Td) immunization. / A one-time dose of Tdap vaccine if you are over 65 and have contact with an infant, are a Research scientist (physical sciences), or simply want to be protected from whooping cough. After that, you need a Td booster dose every 10 years.   Varicella immunization.** / Consult your caregiver.   Meningococcal immunization.** / Consult your caregiver.   Hepatitis A immunization.** / Consult your caregiver. 2 doses, 6 to 18 months apart.   Hepatitis B immunization.** / Check with your caregiver. 3 doses, usually over 6 months.  ** Family  history and personal history of risk and conditions may change your caregiver's recommendations. Document Released: 08/18/2001 Document Revised: 06/11/2011 Document Reviewed: 11/17/2010 [facility] Patient Information 2012 [place] [employer], [place].

## 2011-12-04 NOTE — Progress Notes (Signed)
  Subjective:    Patient ID: [name], female    DOB: 1986/06/02, 26 y.o.   MRN: [id number]  Anemia Presents for follow-up visit. Symptoms include malaise/fatigue. There has been no abdominal pain, anorexia, bruising/bleeding easily, confusion, fever, leg swelling, light-headedness, pallor, palpitations, paresthesias, pica or weight loss. Signs of blood loss that are present include menorrhagia. Signs of blood loss that are not present include hematemesis, hematochezia, melena and vaginal bleeding. There are no compliance problems.       Review of Systems  Constitutional: Positive for malaise/fatigue. Negative for fever, chills, weight loss, diaphoresis, activity change, appetite change, fatigue and unexpected weight change.  HENT: Negative.   Eyes: Negative.   Respiratory: Negative for apnea, cough, choking, chest tightness, shortness of breath, wheezing and stridor.   Cardiovascular: Negative for chest pain, palpitations and leg swelling.  Gastrointestinal: Negative for nausea, vomiting, abdominal pain, diarrhea, constipation, blood in stool, melena, hematochezia, abdominal distention, anal bleeding, anorexia and hematemesis.  Genitourinary: Positive for menorrhagia. Negative for vaginal bleeding.  Musculoskeletal: Negative for myalgias, back pain, joint swelling, arthralgias and gait problem.  Skin: Negative for color change, pallor, rash and wound.  Neurological: Negative.  Negative for light-headedness and paresthesias.  Hematological: Negative for adenopathy. Does not bruise/bleed easily.  Psychiatric/Behavioral: Negative.  Negative for confusion.       Objective:   Physical Exam  Vitals reviewed. Constitutional: She is oriented to person, place, and time. She appears well-developed and well-nourished. No distress.  HENT:  Head: Normocephalic and atraumatic.  Mouth/Throat: Oropharynx is clear and moist. No oropharyngeal exudate.  Eyes: Conjunctivae are normal. Right eye  exhibits no discharge. Left eye exhibits no discharge. No scleral icterus.  Neck: Normal range of motion. Neck supple. No JVD present. No tracheal deviation present. No thyromegaly present.  Cardiovascular: Normal rate, regular rhythm, normal heart sounds and intact distal pulses.  Exam reveals no gallop and no friction rub.   No murmur heard. Pulmonary/Chest: Effort normal and breath sounds normal. No stridor. No respiratory distress. She has no wheezes. She has no rales. She exhibits no tenderness.  Abdominal: Soft. Bowel sounds are normal. She exhibits no distension and no mass. There is no tenderness. There is no rebound and no guarding.  Musculoskeletal: Normal range of motion. She exhibits no edema and no tenderness.  Lymphadenopathy:    She has no cervical adenopathy.  Neurological: She is oriented to person, place, and time.  Skin: Skin is warm and dry. No rash noted. She is not diaphoretic. No erythema. No pallor.  Psychiatric: She has a normal mood and affect. Her behavior is normal. Judgment and thought content normal.     Lab Results  Component Value Date   WBC 10.3 12/04/2011   HGB 9.5* 12/04/2011   HCT 31.0* 12/04/2011   PLT 301.0 12/04/2011   GLUCOSE 84 12/04/2011   CHOL 147 12/04/2011   TRIG 90.0 12/04/2011   HDL 42.70 12/04/2011   LDLCALC 86 12/04/2011   ALT 17 12/04/2011   AST 14 12/04/2011   NA 140 12/04/2011   K 3.4* 12/04/2011   CL 107 12/04/2011   CREATININE 0.7 12/04/2011   BUN 11 12/04/2011   CO2 27 12/04/2011   TSH 1.77 12/04/2011       Assessment & Plan:

## 2011-12-04 NOTE — Assessment & Plan Note (Signed)
She wants to pursue bariatric surgery so I have referred her [facility]

## 2011-12-04 NOTE — Assessment & Plan Note (Signed)
I will check her CBC and her vitamin levels 

## 2011-12-04 NOTE — Assessment & Plan Note (Signed)
Exam done, labs ordered, pt ed material was given 

## 2011-12-05 ENCOUNTER — Encounter: Payer: Self-pay | Admitting: Internal Medicine

## 2011-12-11 ENCOUNTER — Inpatient Hospital Stay (HOSPITAL_COMMUNITY)
Admission: AD | Admit: 2011-12-11 | Discharge: 2011-12-11 | Disposition: A | Discharge status: Home / Self Care | Payer: BC Managed Care – PPO | Source: Ambulatory Visit | Attending: Obstetrics and Gynecology | Admitting: Obstetrics and Gynecology

## 2011-12-16 ENCOUNTER — Encounter: Payer: Self-pay | Admitting: Internal Medicine

## 2011-12-16 DIAGNOSIS — Z0279 Encounter for issue of other medical certificate: Secondary | ICD-10-CM

## 2012-01-28 ENCOUNTER — Other Ambulatory Visit (INDEPENDENT_AMBULATORY_CARE_PROVIDER_SITE_OTHER): Payer: Self-pay | Admitting: General Surgery

## 2012-01-28 ENCOUNTER — Encounter (INDEPENDENT_AMBULATORY_CARE_PROVIDER_SITE_OTHER): Payer: Self-pay | Admitting: Surgery

## 2012-01-28 ENCOUNTER — Ambulatory Visit (INDEPENDENT_AMBULATORY_CARE_PROVIDER_SITE_OTHER): Payer: BC Managed Care – PPO | Admitting: Surgery

## 2012-01-28 VITALS — BP 120/72 | HR 88 | Temp 97.2°F | Resp 18 | Ht 70.0 in | Wt >= 6400 oz

## 2012-01-28 DIAGNOSIS — E66813 Obesity, class 3: Secondary | ICD-10-CM

## 2012-01-28 DIAGNOSIS — Z6841 Body Mass Index (BMI) 40.0 and over, adult: Secondary | ICD-10-CM

## 2012-01-28 DIAGNOSIS — Z9884 Bariatric surgery status: Secondary | ICD-10-CM

## 2012-01-28 NOTE — Progress Notes (Signed)
Chief Complaint:  Morbid obesity   History of Present Illness:  [name] is an 26 y.o. female who wants to Avoid complications of her current BMI of 60. She's been overweight all of her life and wants to try to get control of this before she develops significant comorbidities. She has been to our sessions and is actually obstructive process about 4 times. She has headaches sleep study and has a CPAP mask which he uses intermittently. She does not have diabetes. She is followed by Dr. Santa Genera.  She is interested in a Roux-en-Y gastric bypass. She stated these procedures had no further questions. She has no history of DVT and no prior abdominal surgery. A total we have like for her to lose some of her weight to make her better candidate for laparotomy gastric bypass. We'll work with her with dietary.  Past Medical History  Diagnosis Date  . No pertinent past medical history   . Anemia     Past Surgical History  Procedure Date  . No past surgeries     Current Outpatient Prescriptions  Medication Sig Dispense Refill  . Norgestimate-Eth Estradiol (SPRINTEC 28 PO) Take by mouth.       Review of patient's allergies indicates no known allergies. Family History  Problem Relation Age of Onset  . Hypertension Father   . Alcohol abuse Neg Hx   . Arthritis Neg Hx   . Cancer Neg Hx   . Early death Neg Hx   . Heart disease Neg Hx   . Hyperlipidemia Neg Hx   . Kidney disease Neg Hx   . Stroke Neg Hx    Social History:   reports that she has never smoked. She does not have any smokeless tobacco history on file. She reports that she does not drink alcohol or use illicit drugs.   REVIEW OF SYSTEMS - PERTINENT POSITIVES ONLY: negative  Physical Exam:   Blood pressure 120/72, pulse 88, temperature 97.2 F (36.2 C), temperature source Temporal, resp. rate 18, height 5\' 10"  (1.778 m), weight 419 lb 2 oz (190.114 kg). Body mass index is 60.14 kg/(m^2).  Gen:  WDWN AAF NAD    Neurological: Alert and oriented to person, place, and time. Motor and sensory function is grossly intact  Head: Normocephalic and atraumatic.  Eyes: Conjunctivae are normal. Pupils are equal, round, and reactive to light. No scleral icterus.  Neck: Normal range of motion. Neck supple. No tracheal deviation or thyromegaly present.  Cardiovascular:  SR without murmurs or gallops.  No carotid bruits Respiratory: Effort normal.  No respiratory distress. No chest wall tenderness. Breath sounds normal.  No wheezes, rales or rhonchi.  Abdomen:  Nontender, obese GU: Musculoskeletal: Normal range of motion. Extremities are nontender. No cyanosis, edema or clubbing noted Lymphadenopathy: No cervical, preauricular, postauricular or axillary adenopathy is present Skin: Skin is warm and dry. No rash noted. No diaphoresis. No erythema. No pallor. Pscyh: Normal mood and affect. Behavior is normal. Judgment and thought content normal.   LABORATORY RESULTS: No results found for this or any previous visit (from the past 48 hour(s)).  RADIOLOGY RESULTS: No results found.  Problem List: Patient Active Problem List  Diagnosis  . Routine general medical examination at a health care facility  . Anemia  . Obesity, Class III, BMI 40-49.9 (morbid obesity)    Assessment & Plan: Morbid obesity BMI 60.      Matt B. Daphine Deutscher, MD, [facility] [phone] beeper [phone]  01/28/2012  11:05 AM

## 2012-01-28 NOTE — Patient Instructions (Signed)

## 2012-02-01 ENCOUNTER — Other Ambulatory Visit (INDEPENDENT_AMBULATORY_CARE_PROVIDER_SITE_OTHER): Payer: Self-pay | Admitting: Surgery

## 2012-02-09 ENCOUNTER — Ambulatory Visit (HOSPITAL_COMMUNITY)
Admission: RE | Admit: 2012-02-09 | Discharge: 2012-02-09 | Disposition: A | Discharge status: Home / Self Care | Payer: BC Managed Care – PPO | Source: Ambulatory Visit | Attending: Surgery | Admitting: Surgery

## 2012-02-09 ENCOUNTER — Encounter (HOSPITAL_COMMUNITY)
Admission: RE | Disposition: A | Discharge status: Home / Self Care | Payer: Self-pay | Source: Ambulatory Visit | Attending: Surgery

## 2012-02-09 HISTORY — PX: BREATH TEK H PYLORI: SHX5422

## 2012-02-09 SURGERY — BREATH TEST, FOR HELICOBACTER PYLORI

## 2012-02-10 ENCOUNTER — Encounter (HOSPITAL_COMMUNITY): Payer: Self-pay | Admitting: Surgery

## 2012-02-10 ENCOUNTER — Encounter (HOSPITAL_COMMUNITY): Payer: Self-pay

## 2012-02-15 ENCOUNTER — Other Ambulatory Visit: Payer: Self-pay

## 2012-02-15 ENCOUNTER — Ambulatory Visit (HOSPITAL_COMMUNITY)
Admission: RE | Admit: 2012-02-15 | Discharge: 2012-02-15 | Disposition: A | Discharge status: Home / Self Care | Payer: BC Managed Care – PPO | Source: Ambulatory Visit | Attending: Surgery | Admitting: Surgery

## 2012-02-15 DIAGNOSIS — Z9884 Bariatric surgery status: Secondary | ICD-10-CM

## 2012-02-15 DIAGNOSIS — Z01818 Encounter for other preprocedural examination: Secondary | ICD-10-CM | POA: Insufficient documentation

## 2012-02-15 DIAGNOSIS — K8020 Calculus of gallbladder without cholecystitis without obstruction: Secondary | ICD-10-CM | POA: Insufficient documentation

## 2012-02-20 ENCOUNTER — Encounter: Payer: Self-pay | Admitting: *Deleted

## 2012-02-20 ENCOUNTER — Encounter: Payer: BC Managed Care – PPO | Attending: Surgery | Admitting: *Deleted

## 2012-02-20 VITALS — Ht 70.0 in | Wt >= 6400 oz

## 2012-02-20 DIAGNOSIS — Z01818 Encounter for other preprocedural examination: Secondary | ICD-10-CM | POA: Insufficient documentation

## 2012-02-20 DIAGNOSIS — Z713 Dietary counseling and surveillance: Secondary | ICD-10-CM | POA: Insufficient documentation

## 2012-02-20 NOTE — Progress Notes (Signed)
  Pre-Op Assessment Visit:  Pre-Operative RYGB Surgery  Medical Nutrition Therapy:  Appt start time: 0800   End time:  0900.  Patient was seen on 02/20/2012 for Pre-Operative RYGB Nutrition Assessment. Assessment and letter of approval faxed to [facility] Bariatric Surgery Program coordinator on 02/20/2012.  Approval letter sent to [facility] center and will be available in the chart under the media tab.  Handouts given during visit include:  Pre-Op Goals   Bariatric Surgery Protein Shakes  Patient to call for Pre-Op and Post-Op Nutrition Education at the [facility] when surgery is scheduled.

## 2012-02-20 NOTE — Patient Instructions (Addendum)
   Follow Pre-Op Nutrition Goals to prepare for Gastric Bypass Surgery.   Call the [facility] at [phone] once you have been given your surgery date to enrolled in the Pre-Op Nutrition Class. You will need to attend this nutrition class 3-4 weeks prior to your surgery. 

## 2012-02-24 ENCOUNTER — Telehealth (INDEPENDENT_AMBULATORY_CARE_PROVIDER_SITE_OTHER): Payer: Self-pay | Admitting: General Surgery

## 2012-02-24 NOTE — Telephone Encounter (Signed)
Please Kesha at [facility] reg labs. Pt was there to get labs done I did not see what kind of labs she will need. Last office notes did not state if pt needed notes , please Kesha at [phone]

## 2012-02-26 ENCOUNTER — Other Ambulatory Visit (INDEPENDENT_AMBULATORY_CARE_PROVIDER_SITE_OTHER): Payer: Self-pay | Admitting: Surgery

## 2012-02-26 LAB — COMPREHENSIVE METABOLIC PANEL
BUN: 9 mg/dL (ref 6–23)
CO2: 26 mEq/L (ref 19–32)
Calcium: 8.7 mg/dL (ref 8.4–10.5)
Chloride: 104 mEq/L (ref 96–112)
Creat: 0.63 mg/dL (ref 0.50–1.10)
Total Bilirubin: 0.3 mg/dL (ref 0.3–1.2)

## 2012-02-26 LAB — CBC
HCT: 27.3 % — ABNORMAL LOW (ref 36.0–46.0)
MCH: 16.1 pg — ABNORMAL LOW (ref 26.0–34.0)
MCV: 57.7 fL — ABNORMAL LOW (ref 78.0–100.0)
RBC: 4.73 MIL/uL (ref 3.87–5.11)
RDW: 18.8 % — ABNORMAL HIGH (ref 11.5–15.5)
WBC: 8.6 10*3/uL (ref 4.0–10.5)

## 2012-02-26 LAB — HEMOGLOBIN A1C
Hgb A1c MFr Bld: 5.7 % — ABNORMAL HIGH (ref <5.7)
Mean Plasma Glucose: 117 mg/dL — ABNORMAL HIGH (ref <117)

## 2012-02-26 LAB — TSH: TSH: 1.603 u[IU]/mL (ref 0.350–4.500)

## 2012-02-26 LAB — T4: T4, Total: 13.8 ug/dL — ABNORMAL HIGH (ref 5.0–12.5)

## 2012-02-27 LAB — PREGNANCY, URINE: Preg Test, Ur: NEGATIVE

## 2012-04-11 ENCOUNTER — Encounter: Payer: Self-pay | Admitting: Internal Medicine

## 2012-04-11 ENCOUNTER — Ambulatory Visit (INDEPENDENT_AMBULATORY_CARE_PROVIDER_SITE_OTHER): Payer: BC Managed Care – PPO | Admitting: Internal Medicine

## 2012-04-11 ENCOUNTER — Other Ambulatory Visit (INDEPENDENT_AMBULATORY_CARE_PROVIDER_SITE_OTHER): Payer: BC Managed Care – PPO

## 2012-04-11 VITALS — BP 118/70 | HR 91 | Temp 98.2°F | Resp 16 | Wt >= 6400 oz

## 2012-04-11 DIAGNOSIS — D649 Anemia, unspecified: Secondary | ICD-10-CM

## 2012-04-11 LAB — CBC WITH DIFFERENTIAL/PLATELET
MCHC: 28.3 g/dL — ABNORMAL LOW (ref 30.0–36.0)
Platelets: 318 10*3/uL (ref 150.0–400.0)
RDW: 21.8 % — ABNORMAL HIGH (ref 11.5–14.6)

## 2012-04-11 LAB — FERRITIN: Ferritin: 2.1 ng/mL — ABNORMAL LOW (ref 10.0–291.0)

## 2012-04-11 LAB — VITAMIN B12: Vitamin B-12: 253 pg/mL (ref 211–911)

## 2012-04-11 MED ORDER — FERRALET 90 90-1 MG PO TABS: 1.0000 | ORAL_TABLET | Freq: Every day | ORAL | Status: DC

## 2012-04-11 NOTE — Addendum Note (Signed)
Addended by: Etta Grandchild on: 04/11/2012 12:31 PM   Modules accepted: Orders

## 2012-04-11 NOTE — Patient Instructions (Signed)
Anemia, Nonspecific  Your exam and blood tests show you are anemic. This means your blood (hemoglobin) level is low. Normal hemoglobin values are 12 to 15 g/dL for females and 14 to 17 g/dL for males. Make a note of your hemoglobin level today. The hematocrit percent is also used to measure anemia. A normal hematocrit is 38% to 46% in females and 42% to 49% in males. Make a note of your hematocrit level today.  CAUSES   Anemia can be due to many different causes.   Excessive bleeding from periods (in women).   Intestinal bleeding.   Poor nutrition.   Kidney, thyroid, liver, and bone marrow diseases.  SYMPTOMS   Anemia can come on suddenly (acute). It can also come on slowly. Symptoms can include:   Minor weakness.   Dizziness.   Palpitations.   Shortness of breath.  Symptoms may be absent until half your hemoglobin is missing if it comes on slowly. Anemia due to acute blood loss from an injury or internal bleeding may require blood transfusion if the loss is severe. Hospital care is needed if you are anemic and there is significant continual blood loss.  TREATMENT    Stool tests for blood (Hemoccult) and additional lab tests are often needed. This determines the best treatment.   Further checking on your condition and your response to treatment is very important. It often takes many weeks to correct anemia.  Depending on the cause, treatment can include:   Supplements of iron.   Vitamins B12 and folic acid.   Hormone medicines.If your anemia is due to bleeding, finding the cause of the blood loss is very important. This will help avoid further problems.  SEEK IMMEDIATE MEDICAL CARE IF:    You develop fainting, extreme weakness, shortness of breath, or chest pain.   You develop heavy vaginal bleeding.   You develop bloody or black, tarry stools or vomit up blood.   You develop a high fever, rash, repeated vomiting, or dehydration.  Document Released: 07/30/2004 Document Revised: 09/14/2011 Document  Reviewed: 05/07/2009  ExitCare Patient Information 2013 [employer].

## 2012-04-11 NOTE — Progress Notes (Signed)
  Subjective:    Patient ID: [name], female    DOB: 1986/05/29, 26 y.o.   MRN: [id number]  Anemia Presents for follow-up visit. There has been no abdominal pain, anorexia, bruising/bleeding easily, confusion, fever, leg swelling, light-headedness, malaise/fatigue, pallor, palpitations, paresthesias, pica or weight loss. Signs of blood loss that are not present include hematemesis, hematochezia, melena, menorrhagia and vaginal bleeding. There are no compliance problems.  Compliance with medications is 76-100%.      Review of Systems  Constitutional: Negative for fever, chills, weight loss, malaise/fatigue, diaphoresis, activity change, appetite change, fatigue and unexpected weight change.  HENT: Negative.   Eyes: Negative.   Respiratory: Negative for cough, chest tightness, shortness of breath, wheezing and stridor.   Cardiovascular: Negative for chest pain, palpitations and leg swelling.  Gastrointestinal: Negative for nausea, vomiting, abdominal pain, diarrhea, constipation, melena, hematochezia, anorexia and hematemesis.  Genitourinary: Negative.  Negative for vaginal bleeding and menorrhagia.  Musculoskeletal: Negative.   Skin: Negative for color change, pallor, rash and wound.  Neurological: Negative.  Negative for light-headedness and paresthesias.  Hematological: Negative.  Does not bruise/bleed easily.  Psychiatric/Behavioral: Negative.  Negative for confusion.       Objective:   Physical Exam  Vitals reviewed. Constitutional: She is oriented to person, place, and time. She appears well-developed and well-nourished. No distress.  HENT:  Head: Normocephalic and atraumatic.  Mouth/Throat: Oropharynx is clear and moist. No oropharyngeal exudate.  Eyes: Conjunctivae normal are normal. Right eye exhibits no discharge. Left eye exhibits no discharge. No scleral icterus.  Neck: Normal range of motion. Neck supple. No JVD present. No tracheal deviation present. No  thyromegaly present.  Cardiovascular: Normal rate, regular rhythm, normal heart sounds and intact distal pulses.  Exam reveals no gallop and no friction rub.   No murmur heard. Pulmonary/Chest: Effort normal and breath sounds normal. No stridor. No respiratory distress. She has no wheezes. She has no rales. She exhibits no tenderness.  Abdominal: Soft. Bowel sounds are normal. She exhibits no distension and no mass. There is no tenderness. There is no rebound and no guarding.  Musculoskeletal: Normal range of motion. She exhibits no edema and no tenderness.  Lymphadenopathy:    She has no cervical adenopathy.  Neurological: She is oriented to person, place, and time.  Skin: Skin is warm and dry. No rash noted. She is not diaphoretic. No erythema. No pallor.  Psychiatric: She has a normal mood and affect. Her behavior is normal. Judgment and thought content normal.      Lab Results  Component Value Date   WBC 8.6 02/26/2012   HGB 7.6* 02/26/2012   HCT 27.3* 02/26/2012   PLT 320 02/26/2012   GLUCOSE 74 02/26/2012   CHOL 147 12/04/2011   TRIG 90.0 12/04/2011   HDL 42.70 12/04/2011   LDLCALC 86 12/04/2011   ALT 14 02/26/2012   AST 11 02/26/2012   NA 138 02/26/2012   K 4.0 02/26/2012   CL 104 02/26/2012   CREATININE 0.63 02/26/2012   BUN 9 02/26/2012   CO2 26 02/26/2012   TSH 1.603 02/26/2012   HGBA1C 5.7* 02/26/2012      Assessment & Plan:

## 2012-04-11 NOTE — Assessment & Plan Note (Signed)
She is cleared for surgery from my standpoint

## 2012-04-11 NOTE — Assessment & Plan Note (Signed)
I will recheck her CBC and will look at her vitamin levels as well 

## 2012-04-21 ENCOUNTER — Encounter: Payer: BC Managed Care – PPO | Attending: Surgery | Admitting: *Deleted

## 2012-04-21 VITALS — Ht 70.0 in | Wt >= 6400 oz

## 2012-04-21 DIAGNOSIS — Z713 Dietary counseling and surveillance: Secondary | ICD-10-CM | POA: Insufficient documentation

## 2012-04-21 DIAGNOSIS — Z01818 Encounter for other preprocedural examination: Secondary | ICD-10-CM | POA: Insufficient documentation

## 2012-04-23 NOTE — Progress Notes (Addendum)
Bariatric Class:  Appt start time: 0830 end time:  0930.  Pre-Operative Nutrition Class  Patient was seen on 04/21/12 for Pre-Operative Bariatric Surgery Education at [facility].   Surgery date: 05/09/12 Surgery type: RYGB Start weight at [facility]: 420.5 lbs (02/20/12)  Weight today: 414.0 lbs Weight change: 6.5 lbs  Total weight lost: 6.5 lbs BMI: 59.4  Samples given per MNT protocol: Celebrate Vitamins Multivitamin Lot # [id number] Exp: 09/14  Celebrate Vitamins Multivitamin-Complete Lot # [id number] Exp: 11/14  Opurity Calcium Citrate Lot # [id number] Exp: 11/14  Celebrate Vitamins Sublingual B12 Lot # [id number] Exp:05/15  Unjury Protein Powder Lot # [id number] Exp: 12/14  The following the learning objective met by the patient during this course:  Identifies Pre-Op Dietary Goals and will begin 2 weeks pre-operatively  Identifies appropriate sources of fluids and proteins   States protein recommendations and appropriate sources pre and post-operatively  Identifies Post-Operative Dietary Goals and will follow for 2 weeks post-operatively  Identifies appropriate multivitamin and calcium sources  Describes the need for physical activity post-operatively and will follow MD recommendations  States when to call healthcare provider regarding medication questions or post-operative complications  Handouts given during class include:  Pre-Op Bariatric Surgery Diet Handout  Protein Shake Handout  Post-Op Bariatric Surgery Nutrition Handout  [facility] Program Information Flyer  Support Group Information Flyer  Follow-Up Plan: Patient will follow-up at [facility] 2 weeks post operatively for diet advancement per MD.

## 2012-04-24 ENCOUNTER — Encounter: Payer: Self-pay | Admitting: *Deleted

## 2012-04-24 NOTE — Patient Instructions (Signed)
Follow:   Pre-Op Diet per MD 2 weeks prior to surgery  Phase 2- Liquids (clear/full) 2 weeks after surgery  Vitamin/Mineral/Calcium guidelines for purchasing bariatric supplements  Exercise guidelines pre and post-op per MD  Follow-up at [facility] in 2 weeks post-op for diet advancement. Contact Colum Colt as needed with questions/concerns. 

## 2012-04-27 ENCOUNTER — Inpatient Hospital Stay (HOSPITAL_COMMUNITY): Admission: RE | Admit: 2012-04-27 | Payer: BC Managed Care – PPO | Source: Ambulatory Visit

## 2012-05-06 ENCOUNTER — Ambulatory Visit (INDEPENDENT_AMBULATORY_CARE_PROVIDER_SITE_OTHER): Payer: BC Managed Care – PPO | Admitting: Surgery

## 2012-05-09 ENCOUNTER — Ambulatory Visit (HOSPITAL_COMMUNITY): Admission: RE | Admit: 2012-05-09 | Payer: BC Managed Care – PPO | Source: Ambulatory Visit | Admitting: Surgery

## 2012-05-09 ENCOUNTER — Encounter (HOSPITAL_COMMUNITY): Admission: RE | Payer: Self-pay | Source: Ambulatory Visit

## 2012-05-09 SURGERY — LAPAROSCOPIC ROUX-EN-Y GASTRIC
Anesthesia: General

## 2012-05-15 ENCOUNTER — Emergency Department: Payer: Self-pay | Admitting: Emergency Medicine

## 2012-05-15 ENCOUNTER — Encounter (HOSPITAL_COMMUNITY): Payer: Self-pay | Admitting: *Deleted

## 2012-05-15 ENCOUNTER — Emergency Department (HOSPITAL_COMMUNITY): Payer: BC Managed Care – PPO

## 2012-05-15 ENCOUNTER — Emergency Department (HOSPITAL_COMMUNITY)
Admission: EM | Admit: 2012-05-15 | Discharge: 2012-05-16 | Disposition: A | Discharge status: Home / Self Care | Payer: BC Managed Care – PPO | Attending: Emergency Medicine | Admitting: Emergency Medicine

## 2012-05-15 DIAGNOSIS — G4730 Sleep apnea, unspecified: Secondary | ICD-10-CM | POA: Insufficient documentation

## 2012-05-15 DIAGNOSIS — R0789 Other chest pain: Secondary | ICD-10-CM

## 2012-05-15 DIAGNOSIS — Z6841 Body Mass Index (BMI) 40.0 and over, adult: Secondary | ICD-10-CM | POA: Insufficient documentation

## 2012-05-15 DIAGNOSIS — D509 Iron deficiency anemia, unspecified: Secondary | ICD-10-CM | POA: Insufficient documentation

## 2012-05-15 LAB — CBC WITH DIFFERENTIAL/PLATELET
Basophils Relative: 0 % (ref 0–1)
Eosinophils Absolute: 0.7 10*3/uL (ref 0.0–0.7)
Lymphs Abs: 2.2 10*3/uL (ref 0.7–4.0)
MCH: 16.3 pg — ABNORMAL LOW (ref 26.0–34.0)
Neutrophils Relative %: 70 % (ref 43–77)
Platelets: 379 10*3/uL (ref 150–400)
RBC: 5.39 MIL/uL — ABNORMAL HIGH (ref 3.87–5.11)
WBC: 11.5 10*3/uL — ABNORMAL HIGH (ref 4.0–10.5)

## 2012-05-15 LAB — COMPREHENSIVE METABOLIC PANEL
AST: 14 U/L (ref 0–37)
BUN: 9 mg/dL (ref 6–23)
CO2: 26 mEq/L (ref 19–32)
Chloride: 102 mEq/L (ref 96–112)
Creatinine, Ser: 0.66 mg/dL (ref 0.50–1.10)
GFR calc Af Amer: >90 mL/min (ref >90)
GFR calc non Af Amer: >90 mL/min (ref >90)
Glucose, Bld: 89 mg/dL (ref 70–99)
Total Bilirubin: 0.2 mg/dL — ABNORMAL LOW (ref 0.3–1.2)

## 2012-05-15 LAB — LIPASE, BLOOD: Lipase: 25 U/L (ref 11–59)

## 2012-05-15 LAB — POCT I-STAT TROPONIN I: Troponin i, poc: 0 ng/mL (ref 0.00–0.08)

## 2012-05-15 MED ORDER — FAMOTIDINE 20 MG PO TABS
40.0000 mg | ORAL_TABLET | Freq: Once | ORAL | Status: AC
  Administered 2012-05-16: 40 mg via ORAL
  Filled 2012-05-15: qty 2

## 2012-05-15 MED ORDER — GI COCKTAIL ~~LOC~~
30.0000 mL | Freq: Once | ORAL | Status: AC
  Administered 2012-05-16: 30 mL via ORAL
  Filled 2012-05-15: qty 30

## 2012-05-15 NOTE — ED Notes (Signed)
Labs and xray resulted and reviewed. hgb low. ~ 1 point higher than the last of 7.9.  H/o chronic low hgb.

## 2012-05-15 NOTE — ED Notes (Addendum)
Here from [place], EKG done there, did not want to wait at [place], here for CP, onset 1300 today, constant and sharp, pinpoints to mid and R chest. LS CTA. Has seen card MD as preop for bariatric surgery in past, "everything was OK". Also some sob tonight, denies other sx. No meds PTA. Alert, NAD, calm, interactive, skin W&D, resps e/u, speaking in clear complete sentences.

## 2012-05-16 MED ORDER — OXYCODONE-ACETAMINOPHEN 5-325 MG PO TABS: 2.0000 | ORAL_TABLET | ORAL | Status: DC | PRN

## 2012-05-16 MED ORDER — IBUPROFEN 800 MG PO TABS: 800.0000 mg | ORAL_TABLET | Freq: Three times a day (TID) | ORAL | Status: DC

## 2012-05-16 MED ORDER — KETOROLAC TROMETHAMINE 30 MG/ML IJ SOLN
30.0000 mg | Freq: Once | INTRAMUSCULAR | Status: AC
  Administered 2012-05-16: 30 mg via INTRAVENOUS
  Filled 2012-05-16: qty 1

## 2012-05-16 MED ORDER — OXYCODONE-ACETAMINOPHEN 5-325 MG PO TABS: 2.0000 | ORAL_TABLET | Freq: Once | ORAL | Status: DC

## 2012-05-16 MED ORDER — MORPHINE SULFATE 4 MG/ML IJ SOLN
4.0000 mg | Freq: Once | INTRAMUSCULAR | Status: AC
  Administered 2012-05-16: 4 mg via INTRAVENOUS
  Filled 2012-05-16: qty 1

## 2012-05-16 NOTE — ED Provider Notes (Addendum)
History     CSN: [id number]  Arrival date & time 05/15/12  7829   First MD Initiated Contact with Patient 05/15/12 2304      Chief Complaint  Patient presents with  . Chest Pain    (Consider location/radiation/quality/duration/timing/severity/associated sxs/prior treatment) HPI 26 year old female presents to the emergency department with complaint of central chest pain with some radiation around her right breast and into her back since 1:00 today. Patient denies any nausea, sweating, cough fever. Patient has some minimal shortness of breath with exertion. Pain is sharp. No change with movement. She denies history of similar symptoms. Patient does report a history of GERD. Patient has had prior workup through cardiology for bariatric surgery, and was cleared for surgery. No family history of coronary disease. No leg swelling, no calf pain. Patient is on birth control pills. Patient is morbidly obese. She is nonsmoker Past Medical History  Diagnosis Date  . No pertinent past medical history   . Anemia   . Morbid obesity   . Sleep apnea     Past Surgical History  Procedure Date  . No past surgeries   . Breath tek h pylori 02/09/2012    Procedure: BREATH TEK H PYLORI;  Surgeon: Valarie Merino, MD;  Location: [facility];  Service: General;  Laterality: N/A;    Family History  Problem Relation Age of Onset  . Hypertension Father   . Alcohol abuse Neg Hx   . Arthritis Neg Hx   . Cancer Neg Hx   . Early death Neg Hx   . Heart disease Neg Hx   . Hyperlipidemia Neg Hx   . Kidney disease Neg Hx   . Stroke Neg Hx     History  Substance Use Topics  . Smoking status: Never Smoker   . Smokeless tobacco: Not on file  . Alcohol Use: No    OB History    Grav Para Term Preterm Abortions TAB SAB Ect Mult Living   1    1  1    0      Review of Systems  All other systems reviewed and are negative.  other than listed in HPI  Allergies  Review of patient's allergies  indicates no known allergies.  Home Medications   Current Outpatient Rx  Name  Route  Sig  Dispense  Refill  . SPRINTEC 28 PO   Oral   Take by mouth.         . NYQUIL PO   Oral   Take 10 mLs by mouth at bedtime as needed. For cough and sleep           BP 144/63  Pulse 83  Temp 99 F (37.2 C) (Oral)  Resp 25  SpO2 100%  LMP 04/14/2012  Physical Exam  Nursing note and vitals reviewed. Constitutional: She is oriented to person, place, and time. She appears well-developed and well-nourished.       Morbidly obese  HENT:  Head: Normocephalic and atraumatic.  Nose: Nose normal.  Mouth/Throat: Oropharynx is clear and moist.  Eyes: Conjunctivae normal and EOM are normal. Pupils are equal, round, and reactive to light.  Neck: Normal range of motion. Neck supple. No JVD present. No tracheal deviation present. No thyromegaly present.  Cardiovascular: Normal rate, regular rhythm, normal heart sounds and intact distal pulses.  Exam reveals no gallop and no friction rub.   No murmur heard. Pulmonary/Chest: Effort normal and breath sounds normal. No stridor. No respiratory distress. She has no  wheezes. She has no rales. She exhibits no tenderness.  Abdominal: Soft. Bowel sounds are normal. She exhibits no distension and no mass. There is no tenderness. There is no rebound and no guarding.  Musculoskeletal: Normal range of motion. She exhibits no edema and no tenderness.  Lymphadenopathy:    She has no cervical adenopathy.  Neurological: She is oriented to person, place, and time. She exhibits normal muscle tone. Coordination normal.  Skin: Skin is warm and dry. No rash noted. No erythema. No pallor.  Psychiatric: She has a normal mood and affect. Her behavior is normal. Judgment and thought content normal.    ED Course  Procedures (including critical care time)  Labs Reviewed  COMPREHENSIVE METABOLIC PANEL - Abnormal; Notable for the following:    Albumin 3.2 (*)     Total  Bilirubin 0.2 (*)     All other components within normal limits  CBC WITH DIFFERENTIAL - Abnormal; Notable for the following:    WBC 11.5 (*)     RBC 5.39 (*)     Hemoglobin 8.8 (*)     HCT 32.5 (*)     MCV 60.3 (*)     MCH 16.3 (*)     MCHC 27.1 (*)     RDW 20.8 (*)     Neutro Abs 8.0 (*)     Eosinophils Relative 6 (*)     All other components within normal limits  LIPASE, BLOOD  POCT I-STAT TROPONIN I   Dg Chest 2 View  05/15/2012  *RADIOLOGY REPORT*  Clinical Data: Chest pain  CHEST - 2 VIEW  Comparison: 02/15/2012  Findings: Mild cardiac enlargement.  No pleural effusion or edema identified.  No airspace consolidation is identified.  The visualized osseous structures appear intact.  IMPRESSION:  1.  No acute cardiopulmonary abnormalities.   Original Report Authenticated By: Signa Kell, M.D.     Date: 05/15/2012  Rate: 85  Rhythm: normal sinus rhythm  QRS Axis: normal  Intervals: normal  ST/T Wave abnormalities: normal  Conduction Disutrbances:none  Narrative Interpretation:   Old EKG Reviewed: unchanged    1. Atypical chest pain   2. Iron deficiency anemia   3. Obesity BMI 60       MDM  26 year old female without risk factors for coronary disease, EKG without sinus tachycardia and no hypoxia, however patient is on birth control pills and is morbidly obese. We'll get d-dimer as she is PERC  positive, though I have low suspicion for PE.  Sxs seem more likely to be GERD/dyspepsia      1:20 AM ddimer negative.  Pt reports pain in chest now only with movement, suspect msk in origin.  Will give toradol, dc home with pain medication and f/u with pcm.  Olivia Mackie, MD 05/16/12 0120  Olivia Mackie, MD 05/16/12 [phone]

## 2012-05-18 ENCOUNTER — Ambulatory Visit (INDEPENDENT_AMBULATORY_CARE_PROVIDER_SITE_OTHER): Payer: BC Managed Care – PPO | Admitting: Internal Medicine

## 2012-05-18 ENCOUNTER — Encounter: Payer: Self-pay | Admitting: Internal Medicine

## 2012-05-18 VITALS — BP 162/82 | HR 91 | Temp 97.7°F | Ht 70.0 in | Wt >= 6400 oz

## 2012-05-18 DIAGNOSIS — G580 Intercostal neuropathy: Secondary | ICD-10-CM

## 2012-05-18 DIAGNOSIS — IMO0002 Reserved for concepts with insufficient information to code with codable children: Secondary | ICD-10-CM

## 2012-05-18 DIAGNOSIS — G5680 Other specified mononeuropathies of unspecified upper limb: Secondary | ICD-10-CM

## 2012-05-18 DIAGNOSIS — S29011A Strain of muscle and tendon of front wall of thorax, initial encounter: Secondary | ICD-10-CM

## 2012-05-18 MED ORDER — CYCLOBENZAPRINE HCL 10 MG PO TABS: 10.0000 mg | ORAL_TABLET | Freq: Three times a day (TID) | ORAL | Status: DC | PRN

## 2012-05-18 MED ORDER — DICLOFENAC SODIUM 75 MG PO TBEC: 75.0000 mg | DELAYED_RELEASE_TABLET | Freq: Two times a day (BID) | ORAL | Status: DC

## 2012-05-18 NOTE — Progress Notes (Signed)
Subjective:    Patient ID: [name], female    DOB: Jun 29, 1986, 26 y.o.   MRN: [id number]  HPI  Pt presents to the clinic today for follow up form the ER for chest pain. She presented to the ER on 05/15/2012  with c/o chest pain on the right side that wrap around to her back. They did a chest pain workup which was negative, decided this was muskuloskeletal in origin and sent her home with percocet. Since that time the pain has decreased. She doesn't really like to take the percocet because she says it makes her sick. She has been taking 400 mg of ibuprofen which seems to help the most. She states the pain is worse when she tries to sit up straight, bend or reach. She has never had pain like this before. The pain is 5/10 with movement.  Review of Systems      Past Medical History  Diagnosis Date  . No pertinent past medical history   . Anemia   . Morbid obesity   . Sleep apnea   . Hypertension     Current Outpatient Prescriptions  Medication Sig Dispense Refill  . ibuprofen (ADVIL,MOTRIN) 800 MG tablet Take 1 tablet (800 mg total) by mouth 3 (three) times daily.  21 tablet  0  . Norgestimate-Eth Estradiol (SPRINTEC 28 PO) Take by mouth.      . oxyCODONE-acetaminophen (PERCOCET/ROXICET) 5-325 MG per tablet Take 2 tablets by mouth every 4 (four) hours as needed for pain.  20 tablet  0  . Pseudoeph-Doxylamine-DM-APAP (NYQUIL PO) Take 10 mLs by mouth at bedtime as needed. For cough and sleep        No Known Allergies  Family History  Problem Relation Age of Onset  . Hypertension Father   . Alcohol abuse Neg Hx   . Arthritis Neg Hx   . Cancer Neg Hx   . Early death Neg Hx   . Heart disease Neg Hx   . Hyperlipidemia Neg Hx   . Kidney disease Neg Hx   . Stroke Neg Hx     History   Social History  . Marital Status: Single    Spouse Name: N/A    Number of Children: N/A  . Years of Education: N/A   Occupational History  . Not on file.   Social History Main  Topics  . Smoking status: Never Smoker   . Smokeless tobacco: Not on file  . Alcohol Use: No  . Drug Use: No  . Sexually Active: Yes    Birth Control/ Protection: Condom, Other-see comments, Pill     Comment: last intercourse= 03/06/11, condoms not used every time.   Other Topics Concern  . Not on file   Social History Narrative  . No narrative on file     Constitutional: Denies fever, malaise, fatigue, headache or abrupt weight changes.  Musculoskeletal: Pt reports muscle pain. Denies decrease in range of motion, difficulty with gait or joint pain and swelling.  No other specific complaints in a complete review of systems (except as listed in HPI above).  Objective:   Physical Exam  LMP 04/14/2012 Wt Readings from Last 3 Encounters:  04/21/12 414 lb (187.789 kg)  04/11/12 411 lb 12 oz (186.769 kg)  02/20/12 420 lb 8 oz (190.738 kg)    General: Appears her stated age,  Obese but well developed, well nourished in NAD. Skin: Warm, dry and intact. No rashes, lesions or ulcerations noted.  Cardiovascular: Normal rate and  rhythm. S1,S2 noted.  No murmur, rubs or gallops noted. No JVD or BLE edema. No carotid bruits noted. Pulmonary/Chest: Normal effort and positive vesicular breath sounds. No respiratory distress. No wheezes, rales or ronchi noted.  Musculoskeletal:  Pinpoint tenderness underneath the right breast, in between the ribs. Normal range of motion. No signs of joint swelling. No difficulty with gait.   Assessment & Plan:   Intercostal Nerve Irritation vs Muscle Strain Morbid Obesity  Rest and avoid heaving lifting, straining or irritating the affected area Take Diclofenac 75 mg BID to decrease pain and inflammation Take Flexeril 10 mg TID prn as needed for muscle strain Apply ice to the affected area 20 mins daily  RTC as needed or if symptoms persist

## 2012-05-18 NOTE — Patient Instructions (Addendum)
Muscle Strain °Muscle strain occurs when a muscle is stretched beyond its normal length. A small number of muscle fibers generally are torn. This is especially common in athletes. This happens when a sudden, violent force placed on a muscle stretches it too far. Usually, recovery from muscle strain takes 1 to 2 weeks. Complete healing will take 5 to 6 weeks.  °HOME CARE INSTRUCTIONS  °· While awake, apply ice to the sore muscle for the first 2 days after the injury. °· Put ice in a plastic bag. °· Place a towel between your skin and the bag. °· Leave the ice on for 15 to 20 minutes each hour. °· Do not use the strained muscle for several days, until you no longer have pain. °· You may wrap the injured area with an elastic bandage for comfort. Be careful not to wrap it too tightly. This may interfere with blood circulation or increase swelling. °· Only take over-the-counter or prescription medicines for pain, discomfort, or fever as directed by your caregiver. °SEEK MEDICAL CARE IF:  °You have increasing pain or swelling in the injured area. °MAKE SURE YOU:  °· Understand these instructions. °· Will watch your condition. °· Will get help right away if you are not doing well or get worse. °Document Released: 06/22/2005 Document Revised: 09/14/2011 Document Reviewed: 07/04/2011 °ExitCare® Patient Information ©2013 [employer]. ° °

## 2012-05-23 ENCOUNTER — Ambulatory Visit (INDEPENDENT_AMBULATORY_CARE_PROVIDER_SITE_OTHER): Payer: BC Managed Care – PPO | Admitting: Internal Medicine

## 2012-05-23 ENCOUNTER — Encounter: Payer: Self-pay | Admitting: Internal Medicine

## 2012-05-23 VITALS — BP 138/70 | HR 82 | Temp 98.9°F | Ht 70.0 in | Wt >= 6400 oz

## 2012-05-23 DIAGNOSIS — R0789 Other chest pain: Secondary | ICD-10-CM

## 2012-05-23 DIAGNOSIS — S29011A Strain of muscle and tendon of front wall of thorax, initial encounter: Secondary | ICD-10-CM

## 2012-05-23 DIAGNOSIS — IMO0002 Reserved for concepts with insufficient information to code with codable children: Secondary | ICD-10-CM

## 2012-05-23 DIAGNOSIS — R0782 Intercostal pain: Secondary | ICD-10-CM

## 2012-05-23 MED ORDER — CAPSAICIN-MENTHOL-METHYL SAL 0.025-1-12 % EX CREA: 1.0000 [drp] | TOPICAL_CREAM | Freq: Two times a day (BID) | CUTANEOUS | Status: DC

## 2012-05-23 NOTE — Patient Instructions (Addendum)
Thank you for allowing me to spend time with you today. If you have any further questions, please feel free to call the office at any time and leave a message or you can utilize the MyChart features to send me a quick note, and I will get back with you as soon as possible.  Have a wonderful day! Jabier Deese, NP-C  

## 2012-05-23 NOTE — Progress Notes (Signed)
Subjective:    Patient ID: [name], female    DOB: Jun 27, 1986, 26 y.o.   MRN: [id number]  HPI   Pt presented to the clinic on 05/18/12 for follow up form the ER for chest pain. She presented to the ER on 05/15/2012  with c/o chest pain on the right side that wrap around to her back. They did a chest pain workup which was negative, decided this was muskuloskeletal in origin and sent her home with percocet. Since that time the pain has decreased. She doesn't really like to take the percocet because she says it makes her sick. She has been taking 400 mg of ibuprofen which seems to help the most. She states the pain is worse when she tries to sit up straight, bend or reach. She has never had pain like this before. The pain is 5/10 with movement. On 05/16/2012, I prescribed her Diclofenac and Flexeril which has helped, but she is still concerned because the pain has not gone away completely. She denies chest pain with exertions, chest tightness, shortness of breath or pain with breathing.   Review of Systems       Past Medical History  Diagnosis Date  . No pertinent past medical history   . Anemia   . Morbid obesity   . Sleep apnea   . Hypertension     Current Outpatient Prescriptions  Medication Sig Dispense Refill  . cyclobenzaprine (FLEXERIL) 10 MG tablet Take 1 tablet (10 mg total) by mouth 3 (three) times daily as needed for muscle spasms.  30 tablet  0  . diclofenac (VOLTAREN) 75 MG EC tablet Take 1 tablet (75 mg total) by mouth 2 (two) times daily.  30 tablet  0  . ibuprofen (ADVIL,MOTRIN) 800 MG tablet Take 1 tablet (800 mg total) by mouth 3 (three) times daily.  21 tablet  0  . Norgestimate-Eth Estradiol (SPRINTEC 28 PO) Take by mouth.      . oxyCODONE-acetaminophen (PERCOCET/ROXICET) 5-325 MG per tablet Take 2 tablets by mouth every 4 (four) hours as needed for pain.  20 tablet  0  . Pseudoeph-Doxylamine-DM-APAP (NYQUIL PO) Take 10 mLs by mouth at bedtime as needed.  For cough and sleep        No Known Allergies  Family History  Problem Relation Age of Onset  . Hypertension Father   . Alcohol abuse Neg Hx   . Arthritis Neg Hx   . Cancer Neg Hx   . Early death Neg Hx   . Heart disease Neg Hx   . Hyperlipidemia Neg Hx   . Kidney disease Neg Hx   . Stroke Neg Hx     History   Social History  . Marital Status: Single    Spouse Name: N/A    Number of Children: N/A  . Years of Education: N/A   Occupational History  . Not on file.   Social History Main Topics  . Smoking status: Never Smoker   . Smokeless tobacco: Not on file  . Alcohol Use: No  . Drug Use: No  . Sexually Active: Yes    Birth Control/ Protection: Condom, Other-see comments, Pill     Comment: last intercourse= 03/06/11, condoms not used every time.   Other Topics Concern  . Not on file   Social History Narrative  . No narrative on file     Constitutional: Denies fever, malaise, fatigue, headache or abrupt weight changes.  Musculoskeletal: Pt reports muscle pain. Denies decrease in range  of motion, difficulty with gait or joint pain and swelling.  No other specific complaints in a complete review of systems (except as listed in HPI above).  Objective:   Physical Exam   LMP 05/06/2012 Wt Readings from Last 3 Encounters:  05/18/12 415 lb (188.243 kg)  04/21/12 414 lb (187.789 kg)  04/11/12 411 lb 12 oz (186.769 kg)    General: Appears her stated age,  Obese but well developed, well nourished in NAD. Skin: Warm, dry and intact. No rashes, lesions or ulcerations noted.  Cardiovascular: Normal rate and rhythm. S1,S2 noted.  No murmur, rubs or gallops noted. No JVD or BLE edema. No carotid bruits noted. Pulmonary/Chest: Normal effort and positive vesicular breath sounds. No respiratory distress. No wheezes, rales or ronchi noted.  Musculoskeletal:  Pinpoint tenderness underneath the right breast, in between the ribs. Normal range of motion. No signs of joint  swelling. No difficulty with gait.   Assessment & Plan:   Intercostal Nerve Irritation vs Muscle Strain Morbid Obesity  Reassurance provided, this may take 6 weeks to resolve eRx given for Capsacin cream, apply to the affected area as needed  Rest and avoid heaving lifting, straining or irritating the affected area Continue Diclofenac 75 mg BID to decrease pain and inflammation Continue Flexeril 10 mg TID prn as needed for muscle strain Apply ice to the affected area 20 mins daily  RTC as needed or if symptoms persist

## 2012-05-24 ENCOUNTER — Ambulatory Visit: Payer: BC Managed Care – PPO

## 2012-05-25 ENCOUNTER — Encounter (INDEPENDENT_AMBULATORY_CARE_PROVIDER_SITE_OTHER): Payer: BC Managed Care – PPO | Admitting: Surgery

## 2012-06-24 DIAGNOSIS — Z0279 Encounter for issue of other medical certificate: Secondary | ICD-10-CM

## 2012-07-06 HISTORY — PX: HERNIA REPAIR: SHX51

## 2012-08-29 HISTORY — PX: GASTRIC BYPASS: SHX52

## 2013-05-10 ENCOUNTER — Encounter (HOSPITAL_COMMUNITY): Payer: Self-pay | Admitting: Radiology

## 2013-05-10 ENCOUNTER — Inpatient Hospital Stay (HOSPITAL_COMMUNITY)
Admission: AD | Admit: 2013-05-10 | Discharge: 2013-05-11 | Disposition: A | Discharge status: Home / Self Care | Payer: Medicaid Other | Source: Ambulatory Visit | Attending: Obstetrics & Gynecology | Admitting: Obstetrics & Gynecology

## 2013-05-10 ENCOUNTER — Inpatient Hospital Stay (HOSPITAL_COMMUNITY): Payer: Medicaid Other

## 2013-05-10 DIAGNOSIS — O99019 Anemia complicating pregnancy, unspecified trimester: Secondary | ICD-10-CM | POA: Insufficient documentation

## 2013-05-10 DIAGNOSIS — D649 Anemia, unspecified: Secondary | ICD-10-CM | POA: Diagnosis not present

## 2013-05-10 DIAGNOSIS — R350 Frequency of micturition: Secondary | ICD-10-CM | POA: Diagnosis not present

## 2013-05-10 DIAGNOSIS — O99011 Anemia complicating pregnancy, first trimester: Secondary | ICD-10-CM

## 2013-05-10 DIAGNOSIS — O34599 Maternal care for other abnormalities of gravid uterus, unspecified trimester: Secondary | ICD-10-CM | POA: Insufficient documentation

## 2013-05-10 DIAGNOSIS — R109 Unspecified abdominal pain: Secondary | ICD-10-CM | POA: Diagnosis present

## 2013-05-10 DIAGNOSIS — N83209 Unspecified ovarian cyst, unspecified side: Secondary | ICD-10-CM | POA: Insufficient documentation

## 2013-05-10 DIAGNOSIS — O26899 Other specified pregnancy related conditions, unspecified trimester: Secondary | ICD-10-CM

## 2013-05-10 HISTORY — DX: Encounter for other specified aftercare: Z51.89

## 2013-05-10 LAB — URINALYSIS, ROUTINE W REFLEX MICROSCOPIC
Bilirubin Urine: NEGATIVE
Hgb urine dipstick: NEGATIVE
Ketones, ur: NEGATIVE mg/dL
Nitrite: NEGATIVE
Protein, ur: NEGATIVE mg/dL
Urobilinogen, UA: 0.2 mg/dL (ref 0.0–1.0)

## 2013-05-10 LAB — CBC
HCT: 32.4 % — ABNORMAL LOW (ref 36.0–46.0)
MCH: 21.2 pg — ABNORMAL LOW (ref 26.0–34.0)
MCHC: 30.2 g/dL (ref 30.0–36.0)
MCV: 70 fL — ABNORMAL LOW (ref 78.0–100.0)
RDW: 18.4 % — ABNORMAL HIGH (ref 11.5–15.5)
WBC: 9.7 10*3/uL (ref 4.0–10.5)

## 2013-05-10 NOTE — MAU Note (Signed)
Positive UPT at home x3. LMP 10/8 & was shorter than normal. Lower abdominal cramping x 2 days. Denies vaginal bleeding. Small amount of clear vaginal discharge that is thin consistency. Increased frequency with urination x 5 days. Denies pain with urination.

## 2013-05-11 ENCOUNTER — Encounter (HOSPITAL_COMMUNITY): Payer: Self-pay | Admitting: Advanced Practice Midwife

## 2013-05-11 DIAGNOSIS — O9989 Other specified diseases and conditions complicating pregnancy, childbirth and the puerperium: Secondary | ICD-10-CM

## 2013-05-11 DIAGNOSIS — R109 Unspecified abdominal pain: Secondary | ICD-10-CM

## 2013-05-11 LAB — WET PREP, GENITAL
Trich, Wet Prep: NONE SEEN
Yeast Wet Prep HPF POC: NONE SEEN

## 2013-05-11 LAB — GC/CHLAMYDIA PROBE AMP
CT Probe RNA: NEGATIVE
GC Probe RNA: NEGATIVE

## 2013-05-11 NOTE — MAU Provider Note (Signed)
Chief Complaint: Abdominal Pain and Urinary Frequency  First Provider Initiated Contact with Patient 05/11/13 0107     SUBJECTIVE HPI: [name] is a 27 y.o. G2P0010 at 4.1 weeks by LMP who presents with low abdominal cramping x2 days, positive home UPT x3 and frequency of urination.  Rates pain 2/10 on pain scale now, but 10/10 at worst. Has not had any other testing this pregnancy. Denies fever, chills, dysuria, vaginal bleeding, vaginal discharge, urgency, flank pain or GI complaints.  Past Medical History  Diagnosis Date  . No pertinent past medical history   . Anemia   . Morbid obesity   . Sleep apnea     hx of  . Blood transfusion without reported diagnosis 2005   OB History  Gravida Para Term Preterm AB SAB TAB Ectopic Multiple Living  2    1 1     0    # Outcome Date GA Lbr Len/2nd Weight Sex Delivery Anes PTL Lv  2 CUR           1 SAB 04/24/08 [redacted]w[redacted]d       N     Past Surgical History  Procedure Laterality Date  . Breath tek h pylori  02/09/2012    Procedure: BREATH TEK H PYLORI;  Surgeon: Valarie Merino, MD;  Location: [facility];  Service: General;  Laterality: N/A;  . Gastric bypass  08/29/2012  . Hernia repair  2014   History   Social History  . Marital Status: Single    Spouse Name: N/A    Number of Children: N/A  . Years of Education: N/A   Occupational History  . Not on file.   Social History Main Topics  . Smoking status: Never Smoker   . Smokeless tobacco: Not on file  . Alcohol Use: No  . Drug Use: No  . Sexual Activity: Yes    Birth Control/ Protection: Condom, Other-see comments, Pill     .   Other Topics Concern  . Not on file   Social History Narrative  . No narrative on file   No current facility-administered medications on file prior to encounter.   No current outpatient prescriptions on file prior to encounter.   No Known Allergies  ROS: Pertinent items in HPI  OBJECTIVE Blood pressure 134/65, pulse 71,  temperature 99 F (37.2 C), temperature source Oral, resp. rate 20, height 5\' 10"  (1.778 m), weight 143.427 kg (316 lb 3.2 oz), last menstrual period 04/12/2013, SpO2 100.00%. GENERAL: Well-developed, well-nourished morbidly obese female in no acute distress.  HEENT: Normocephalic HEART: normal rate RESP: normal effort ABDOMEN: Soft, non-tender. No CVA tenderness. EXTREMITIES: Nontender, no edema NEURO: Alert and oriented SPECULUM EXAM: NEFG, physiologic discharge, no blood noted, cervix clean BIMANUAL: cervix closed; unable to assess uterine size to 2 body habitus, no adnexal tenderness or masses, no cervical motion tenderness.  LAB RESULTS Results for orders placed during the hospital encounter of 05/10/13 (from the past 24 hour(s))  URINALYSIS, ROUTINE W REFLEX MICROSCOPIC     Status: Abnormal   Collection Time    05/10/13 10:00 PM      Result Value Range   Color, Urine YELLOW  YELLOW   APPearance CLEAR  CLEAR   Specific Gravity, Urine >1.030 (*) 1.005 - 1.030   pH 6.0  5.0 - 8.0   Glucose, UA NEGATIVE  NEGATIVE mg/dL   Hgb urine dipstick NEGATIVE  NEGATIVE   Bilirubin Urine NEGATIVE  NEGATIVE   Ketones, ur NEGATIVE  NEGATIVE mg/dL   Protein, ur NEGATIVE  NEGATIVE mg/dL   Urobilinogen, UA 0.2  0.0 - 1.0 mg/dL   Nitrite NEGATIVE  NEGATIVE   Leukocytes, UA NEGATIVE  NEGATIVE  POCT PREGNANCY, URINE     Status: Abnormal   Collection Time    05/10/13 10:13 PM      Result Value Range   Preg Test, Ur POSITIVE (*) NEGATIVE  CBC     Status: Abnormal   Collection Time    05/10/13 11:05 PM      Result Value Range   WBC 9.7  4.0 - 10.5 K/uL   RBC 4.63  3.87 - 5.11 MIL/uL   Hemoglobin 9.8 (*) 12.0 - 15.0 g/dL   HCT 57.8 (*) 46.9 - 62.9 %   MCV 70.0 (*) 78.0 - 100.0 fL   MCH 21.2 (*) 26.0 - 34.0 pg   MCHC 30.2  30.0 - 36.0 g/dL   RDW 52.8 (*) 41.3 - 24.4 %   Platelets 339  150 - 400 K/uL  HCG, QUANTITATIVE, PREGNANCY     Status: Abnormal   Collection Time    05/10/13 11:05 PM       Result Value Range   hCG, Beta Chain, Quant, S 96 (*) <5 mIU/mL  WET PREP, GENITAL     Status: Abnormal   Collection Time    05/11/13  1:22 AM      Result Value Range   Yeast Wet Prep HPF POC NONE SEEN  NONE SEEN   Trich, Wet Prep NONE SEEN  NONE SEEN   Clue Cells Wet Prep HPF POC FEW (*) NONE SEEN   WBC, Wet Prep HPF POC MODERATE (*) NONE SEEN    IMAGING US Ob Comp Less 14 Wks  05/11/2013   CLINICAL DATA:  27-year- female with cramping. Quantitative beta HCG 96. Estimated gestational age by LMP 4 weeks and 0 days.  EXAM: OBSTETRIC <14 WK [name] AND TRANSVAGINAL OB US  TECHNIQUE: Both transabdominal and transvaginal ultrasound examinations were performed for complete evaluation of the gestation as well as the maternal uterus, adnexal regions, and pelvic cul-de-sac. Transvaginal technique was performed to assess early pregnancy.  COMPARISON:  None.  FINDINGS: Intrauterine gestational sac: None visualized.  Maternal uterus/adnexae:  Large body habitus.  Moderate volume of simple appearing pelvic free fluid.  There is endometrial thickening in the uterine fundus.  The left ovary measures 4.3 x 2.8 by 2.7 cm and contains an irregular shaped complex structure measuring up to 2 cm (image 39). No central vascularity detected with power Doppler (image 46).  The right ovary has a more homogeneous and normal appearance measuring 3.4 x 2.5 x 2.1 cm.  IMPRESSION: 1. No IUP identified.  2. Moderate volume of simple appearing pelvic free fluid.  3. 2 cm complex structure in the left ovary, favor collapsing cyst.  4. Ectopic pregnancy is not excluded. Recommend follow-up serial quantitative B-HCG levels and  repeat ultrasound as necessary.   Electronically Signed   By: Augusto Gamble M.D.   On: 05/11/2013 00:38   US Ob Transvaginal  05/11/2013   CLINICAL DATA:  27-year- female with cramping. Quantitative beta HCG 96. Estimated gestational age by LMP 4 weeks and 0 days.  EXAM: OBSTETRIC <14 WK [name] AND TRANSVAGINAL OB  US  TECHNIQUE: Both transabdominal and transvaginal ultrasound examinations were performed for complete evaluation of the gestation as well as the maternal uterus, adnexal regions, and pelvic cul-de-sac. Transvaginal technique was performed to assess early pregnancy.  COMPARISON:  None.  FINDINGS: Intrauterine gestational sac: None visualized.  Maternal uterus/adnexae:  Large body habitus.  Moderate volume of simple appearing pelvic free fluid.  There is endometrial thickening in the uterine fundus.  The left ovary measures 4.3 x 2.8 by 2.7 cm and contains an irregular shaped complex structure measuring up to 2 cm (image 39). No central vascularity detected with power Doppler (image 46).  The right ovary has a more homogeneous and normal appearance measuring 3.4 x 2.5 x 2.1 cm.  IMPRESSION: 1. No IUP identified.  2. Moderate volume of simple appearing pelvic free fluid.  3. 2 cm complex structure in the left ovary, favor collapsing cyst.  4. Ectopic pregnancy is not excluded. Recommend follow-up serial quantitative B-HCG levels and  repeat ultrasound as necessary.   Electronically Signed   By: Augusto Gamble M.D.   On: 05/11/2013 00:38   MAU COURSE  ASSESSMENT 1. Abdominal pain complicating pregnancy, antepartum   2. Frequency of urination   3. Ruptured ovarian cyst   4. Anemia complicating pregnancy in first trimester    PLAN Discharge home in stable condition. Ectopic and SAB precautions. Plan ultrasound in 2 weeks for viability in hCG rises appropriately. Urine culture and GC Chlamydia cultures pending. Follow-up Information   Follow up with THE [facility] OF [place] MATERNITY ADMISSIONS On 05/13/2013. (4 blood work or sooner as needed if symptoms worsen)    Contact information:   [address] [id number] [place] [address] [phone]          Follow-up Information   Follow up with THE [facility] OF [place] MATERNITY ADMISSIONS On 05/13/2013. (4 blood work or  sooner as needed if symptoms worsen)    Contact information:   [address] [id number] [place] [address] [phone]       Medication List         acetaminophen 500 MG tablet  Commonly known as:  TYLENOL  Take 1,000 mg by mouth every 6 (six) hours as needed for moderate pain.     B-12 500 MCG Subl  Place 1 tablet under the tongue.     IRON PO  Take 1 tablet by mouth.     MULTIVITAMIN PO  Take 1 tablet by mouth.       [place], CNM 05/11/2013  2:09 AM

## 2013-05-12 ENCOUNTER — Inpatient Hospital Stay (HOSPITAL_COMMUNITY)
Admission: AD | Admit: 2013-05-12 | Discharge: 2013-05-12 | Disposition: A | Discharge status: Home / Self Care | Payer: Medicaid Other | Source: Ambulatory Visit | Attending: Obstetrics & Gynecology | Admitting: Obstetrics & Gynecology

## 2013-05-12 DIAGNOSIS — N949 Unspecified condition associated with female genital organs and menstrual cycle: Secondary | ICD-10-CM

## 2013-05-12 DIAGNOSIS — O9989 Other specified diseases and conditions complicating pregnancy, childbirth and the puerperium: Secondary | ICD-10-CM

## 2013-05-12 DIAGNOSIS — O99891 Other specified diseases and conditions complicating pregnancy: Secondary | ICD-10-CM | POA: Insufficient documentation

## 2013-05-12 DIAGNOSIS — R109 Unspecified abdominal pain: Secondary | ICD-10-CM | POA: Insufficient documentation

## 2013-05-12 LAB — HCG, QUANTITATIVE, PREGNANCY: hCG, Beta Chain, Quant, S: 296 m[IU]/mL — ABNORMAL HIGH (ref <5)

## 2013-05-12 NOTE — MAU Note (Signed)
I was here 2 days ago with cramping. Here now for repeat labs to be sure my numbers are doubling. Still having some cramping but no bleeding

## 2013-05-12 NOTE — MAU Note (Signed)
[name] CNM talked to pt in Triage regarding test results and follow up plan. Pt d/c ambulatory from Triage.

## 2013-05-12 NOTE — Progress Notes (Signed)
Thressa Sheller CNM discussed d/c plan and follow up with pt. Pt d/c ambulatory from Triage

## 2013-05-12 NOTE — MAU Provider Note (Signed)
  History     CSN: [id number]  Arrival date and time: 05/12/13 2052   None     Chief Complaint  Patient presents with  . Follow-up   HPI  [name] is a 27 y.o. G2P0010 at [redacted]w[redacted]d who presents today for FU HCG. She denies any pain or bleeding at this time.   Past Medical History  Diagnosis Date  . No pertinent past medical history   . Anemia   . Morbid obesity   . Sleep apnea     hx of  . Blood transfusion without reported diagnosis 2005    Past Surgical History  Procedure Laterality Date  . Breath tek h pylori  02/09/2012    Procedure: BREATH TEK H PYLORI;  Surgeon: Valarie Merino, MD;  Location: [facility];  Service: General;  Laterality: N/A;  . Gastric bypass  08/29/2012  . Hernia repair  2014    Family History  Problem Relation Age of Onset  . Hypertension Father   . Alcohol abuse Neg Hx   . Arthritis Neg Hx   . Cancer Neg Hx   . Early death Neg Hx   . Heart disease Neg Hx   . Hyperlipidemia Neg Hx   . Kidney disease Neg Hx   . Stroke Neg Hx     History  Substance Use Topics  . Smoking status: Never Smoker   . Smokeless tobacco: Not on file  . Alcohol Use: No    Allergies: No Known Allergies  Prescriptions prior to admission  Medication Sig Dispense Refill  . acetaminophen (TYLENOL) 500 MG tablet Take 1,000 mg by mouth every 6 (six) hours as needed for moderate pain.      . Cyanocobalamin (B-12) 500 MCG SUBL Place 1 tablet under the tongue.      . IRON PO Take 1 tablet by mouth.      . Multiple Vitamins-Minerals (MULTIVITAMIN PO) Take 1 tablet by mouth.        ROS Physical Exam   Blood pressure 127/72, pulse 91, temperature 99.3 F (37.4 C), resp. rate 18, height 5\' 10"  (1.778 m), weight 144.516 kg (318 lb 9.6 oz), last menstrual period 04/12/2013.  Physical Exam  Nursing note and vitals reviewed. Constitutional: She is oriented to person, place, and time. She appears well-developed and well-nourished. No distress.   Neurological: She is alert and oriented to person, place, and time.  Psychiatric: She has a normal mood and affect.    MAU Course  Procedures  Results for [name], NOLTON [name] (MRN [id number]) as of 05/12/2013 22:00  Ref. Range 05/10/2013 23:05 05/11/2013 00:30 05/11/2013 01:22 05/12/2013 21:25  hCG, Beta Chain, Quant, S Latest Range: <5 mIU/mL 96 (H)   296 (H)    Assessment and Plan  Early pregnancy with appropriately rising HCG Ectopic precautions reviewed, knows to return to MAU as needed Repeat ultrasound in 10 day to confirm IUP.   Tawnya Crook 05/12/2013, 10:01 PM

## 2013-05-22 ENCOUNTER — Ambulatory Visit (HOSPITAL_COMMUNITY)
Admission: RE | Admit: 2013-05-22 | Discharge: 2013-05-22 | Disposition: A | Discharge status: Home / Self Care | Payer: Medicaid Other | Source: Ambulatory Visit | Attending: Advanced Practice Midwife | Admitting: Advanced Practice Midwife

## 2013-05-22 ENCOUNTER — Other Ambulatory Visit (HOSPITAL_COMMUNITY): Payer: Self-pay | Admitting: Advanced Practice Midwife

## 2013-05-22 DIAGNOSIS — O34599 Maternal care for other abnormalities of gravid uterus, unspecified trimester: Secondary | ICD-10-CM | POA: Insufficient documentation

## 2013-05-22 DIAGNOSIS — D250 Submucous leiomyoma of uterus: Secondary | ICD-10-CM | POA: Insufficient documentation

## 2013-05-22 DIAGNOSIS — O3410 Maternal care for benign tumor of corpus uteri, unspecified trimester: Secondary | ICD-10-CM | POA: Insufficient documentation

## 2013-05-22 DIAGNOSIS — Z6841 Body Mass Index (BMI) 40.0 and over, adult: Secondary | ICD-10-CM | POA: Insufficient documentation

## 2013-05-22 DIAGNOSIS — N949 Unspecified condition associated with female genital organs and menstrual cycle: Secondary | ICD-10-CM

## 2013-05-22 DIAGNOSIS — E669 Obesity, unspecified: Secondary | ICD-10-CM | POA: Insufficient documentation

## 2013-05-22 DIAGNOSIS — N831 Corpus luteum cyst of ovary, unspecified side: Secondary | ICD-10-CM | POA: Insufficient documentation

## 2013-06-08 ENCOUNTER — Ambulatory Visit (INDEPENDENT_AMBULATORY_CARE_PROVIDER_SITE_OTHER): Payer: Medicaid Other | Admitting: Obstetrics & Gynecology

## 2013-06-08 ENCOUNTER — Encounter: Payer: Self-pay | Admitting: Obstetrics & Gynecology

## 2013-06-08 VITALS — BP 139/79 | Temp 98.5°F | Wt 320.0 lb

## 2013-06-08 DIAGNOSIS — Z3201 Encounter for pregnancy test, result positive: Secondary | ICD-10-CM

## 2013-06-08 DIAGNOSIS — Z9884 Bariatric surgery status: Secondary | ICD-10-CM

## 2013-06-08 DIAGNOSIS — Z3401 Encounter for supervision of normal first pregnancy, first trimester: Secondary | ICD-10-CM

## 2013-06-08 LAB — POCT URINALYSIS DIPSTICK
Nitrite, UA: NEGATIVE
Spec Grav, UA: 1.02
Urobilinogen, UA: NEGATIVE

## 2013-06-08 LAB — HEMOGLOBIN A1C: Hgb A1c MFr Bld: 5.3 % (ref <5.7)

## 2013-06-08 NOTE — Progress Notes (Deleted)
Pulse 76   Subjective:    [name] is a G2P0010 [redacted]w[redacted]d being seen today for her first obstetrical visit.  Her obstetrical history is significant for {ob risk factors:10154}. Patient {does/does not:19097} intend to breast feed. Pregnancy history fully reviewed.  Patient reports {sx:14538}.  Filed Vitals:   06/08/13 1817  BP: 139/79  Temp: 98.5 F (36.9 C)  Weight: 320 lb (145.151 kg)    HISTORY: OB History  Gravida Para Term Preterm AB SAB TAB Ectopic Multiple Living  2    1 1     0    # Outcome Date GA Lbr Len/2nd Weight Sex Delivery Anes PTL Lv  2 CUR           1 SAB 04/24/08 [redacted]w[redacted]d       N     Past Medical History  Diagnosis Date  . No pertinent past medical history   . Anemia   . Morbid obesity   . Sleep apnea     hx of  . Blood transfusion without reported diagnosis 2005   Past Surgical History  Procedure Laterality Date  . Breath tek h pylori  02/09/2012    Procedure: BREATH TEK H PYLORI;  Surgeon: Valarie Merino, MD;  Location: [facility];  Service: General;  Laterality: N/A;  . Gastric bypass  08/29/2012  . Hernia repair  2014   Family History  Problem Relation Age of Onset  . Hypertension Father   . Alcohol abuse Neg Hx   . Arthritis Neg Hx   . Cancer Neg Hx   . Early death Neg Hx   . Heart disease Neg Hx   . Hyperlipidemia Neg Hx   . Kidney disease Neg Hx   . Stroke Neg Hx   . Hypertension Mother      Exam    Uterus:     Pelvic Exam:    Perineum: {Exam; anus and perineum:14598}   Vulva: {vulva exam:14487}   Vagina:  {vaginal exam:14498}   pH: ***   Cervix: {cervix exam:14595}   Adnexa: {adnexa:12223}   Bony Pelvis: {desc; pelvis:14491}  System: Breast:  {Exam; breast:13139::"normal appearance, no masses or tenderness"}   Skin: {pe skin brief ob:314459::"normal coloration and turgor, no rashes"}    Neurologic: {Exam; neuro:16375}   Extremities: {Exam; extremities:15096}   HEENT {exam; heent:31974}   Mouth/Teeth {pe mouth  simple ob:314450::"mucous membranes moist, pharynx normal without lesions"}   Neck {Neck (ob):32092}   Cardiovascular: {HEART EXAM HEM/ONC:21750}   Respiratory:  {Exam; respiratory:5782::"appears well, vitals normal, no respiratory distress, acyanotic, normal RR","ear and throat exam is normal","neck free of mass or lymphadenopathy","chest clear, no wheezing, crepitations, rhonchi, normal symmetric air entry"}   Abdomen: {Exam; abdomen:16834}   Urinary: {exam; urinary female:30845}      Assessment:    Pregnancy: G2P0010 Patient Active Problem List   Diagnosis Date Noted  . Routine general medical examination at a health care facility 12/04/2011  . Iron deficiency anemia 12/04/2011  . Obesity BMI 60 12/04/2011        Plan:     Initial labs drawn. Prenatal vitamins. Problem list reviewed and updated. Genetic Screening discussed {GENETIC SCREENING TEST:22046}: {requests/ordered/declines:14581}.  Ultrasound discussed; fetal survey: {requests/ordered/declines:14581}.  Follow up in {numbers 0-4:31231} weeks. ***% of *** min visit spent on counseling and coordination of care.  ***   Marya Landry Dellion 06/08/2013

## 2013-06-09 ENCOUNTER — Encounter: Payer: Self-pay | Admitting: Advanced Practice Midwife

## 2013-06-09 LAB — FOLATE: Folate: >20 ng/mL

## 2013-06-09 LAB — OBSTETRIC PANEL
Basophils Absolute: 0 10*3/uL (ref 0.0–0.1)
Eosinophils Absolute: 0.5 10*3/uL (ref 0.0–0.7)
Eosinophils Relative: 5 % (ref 0–5)
Lymphocytes Relative: 23 % (ref 12–46)
MCH: 21.3 pg — ABNORMAL LOW (ref 26.0–34.0)
MCV: 68.8 fL — ABNORMAL LOW (ref 78.0–100.0)
Neutrophils Relative %: 65 % (ref 43–77)
Platelets: 310 10*3/uL (ref 150–400)
RDW: 17.3 % — ABNORMAL HIGH (ref 11.5–15.5)
WBC: 10.8 10*3/uL — ABNORMAL HIGH (ref 4.0–10.5)

## 2013-06-09 LAB — GC/CHLAMYDIA PROBE AMP
CT Probe RNA: NEGATIVE
GC Probe RNA: NEGATIVE

## 2013-06-09 LAB — VITAMIN B12: Vitamin B-12: 447 pg/mL (ref 211–911)

## 2013-06-09 LAB — CULTURE, OB URINE

## 2013-06-09 LAB — HIV ANTIBODY (ROUTINE TESTING W REFLEX): HIV: NONREACTIVE

## 2013-06-09 LAB — FERRITIN: Ferritin: 1 ng/mL — ABNORMAL LOW (ref 10–291)

## 2013-06-11 ENCOUNTER — Encounter: Payer: Self-pay | Admitting: Obstetrics & Gynecology

## 2013-06-11 DIAGNOSIS — O99210 Obesity complicating pregnancy, unspecified trimester: Secondary | ICD-10-CM | POA: Insufficient documentation

## 2013-06-11 DIAGNOSIS — Z9884 Bariatric surgery status: Secondary | ICD-10-CM | POA: Insufficient documentation

## 2013-06-11 NOTE — Progress Notes (Signed)
Subjective:    [name] is being seen today for her first obstetrical visit.  This is not a planned pregnancy. She is at [redacted]w[redacted]d gestation.  Pregnancy history fully reviewed.  Menstrual History: OB History   Grav Para Term Preterm Abortions TAB SAB Ect Mult Living   2    1  1    0       Patient's last menstrual period was 04/12/2013.    The following portions of the patient's history were reviewed and updated as appropriate: allergies, current medications, past family history, past medical history, past social history, past surgical history and problem list.  Review of Systems Pertinent items are noted in HPI.    Objective:   General Appearance:    Alert, cooperative, no distress, appears stated age  Head:    Normocephalic, without obvious abnormality, atraumatic  Eyes:    PERRL, conjunctiva/corneas clear, EOM's intact, fundi    benign, both eyes  Ears:    Normal TM's and external ear canals, both ears  Nose:   Nares normal, septum midline, mucosa normal, no drainage    or sinus tenderness  Throat:   Lips, mucosa, and tongue normal; teeth and gums normal  Neck:   Supple, symmetrical, trachea midline, no adenopathy;    thyroid:  no enlargement/tenderness/nodules; no carotid   bruit or JVD  Back:     Symmetric, no curvature, ROM normal, no CVA tenderness  Lungs:     Clear to auscultation bilaterally, respirations unlabored  Chest Wall:    No tenderness or deformity   Heart:    Regular rate and rhythm, S1 and S2 normal, no murmur, rub   or gallop  Breast Exam:    No tenderness, masses, or nipple abnormality  Abdomen:     Soft, non-tender, bowel sounds active all four quadrants,    no masses, no organomegaly  Genitalia:    Normal female without lesion, discharge or tenderness  Extremities:   Extremities normal, atraumatic, no cyanosis or edema  Pulses:   2+ and symmetric all extremities  Skin:   Skin color, texture, turgor normal, no rashes or lesions  Lymph  nodes:   Cervical, supraclavicular, and axillary nodes normal  Neurologic:   CNII-XII intact, normal strength, sensation and reflexes    throughout  Informal U/S: cardiac activity noted  Assessment:    Pregnancy at [redacted]w[redacted]d weeks    Plan:    Initial labs drawn.   Orders Placed This Encounter  Procedures  . Culture, OB Urine  . GC/Chlamydia Probe Amp  . Obstetric panel  . HIV antibody  . Hemoglobinopathy evaluation  . Varicella zoster antibody, IgG  . Vit D  25 hydroxy (rtn osteoporosis monitoring)  . Folate  . Ferritin  . Iron  . B12  . Calcium  . Hemoglobin A1c  . Vitamin B1  . POCT urinalysis dipstick   Prenatal vitamins. Continue current vitamin supplements Counseling provided regarding continued use of seat belts, cessation of alcohol consumption, smoking or use of illicit drugs; infection precautions i.e., influenza/TDAP immunizations, toxoplasmosis,CMV, parvovirus, listeria and varicella; workplace safety, exercise during pregnancy; routine dental care, safe medications, sexual activity, hot tubs, saunas, pools, travel, caffeine use, fish and methlymercury, potential toxins, hair treatments, varicose veins Weight gain recommendations per IOM guidelines reviewed: underweight/BMI obese/BMI >30->gain  11 - 20 lbs Problem list reviewed and updated. FIRST/CF mutation testing/QUAD SCREEN discussed Role of ultrasound in pregnancy discussed; fetal survey Amniocentesis discussed: not indicated. Follow up in 4 weeks. 50%  of 20 min visit spent on counseling and coordination of care.

## 2013-06-11 NOTE — Patient Instructions (Signed)
Pregnancy - First Trimester During sexual intercourse, millions of sperm go into the vagina. Only 1 sperm will penetrate and fertilize the female egg while it is in the Fallopian tube. One week later, the fertilized egg implants into the wall of the uterus. An embryo begins to develop into a baby. At 6 to 8 weeks, the eyes and face are formed and the heartbeat can be seen on ultrasound. At the end of 12 weeks (first trimester), all the baby's organs are formed. Now that you are pregnant, you will want to do everything you can to have a healthy baby. Two of the most important things are to get good prenatal care and follow your caregiver's instructions. Prenatal care is all the medical care you receive before the baby's birth. It is given to prevent, find, and treat problems during the pregnancy and childbirth. PRENATAL EXAMS  During prenatal visits, your weight, blood pressure, and urine are checked. This is done to make sure you are healthy and progressing normally during the pregnancy.  A pregnant woman should gain 25 to 35 pounds during the pregnancy. However, if you are overweight or underweight, your caregiver will advise you regarding your weight.  Your caregiver will ask and answer questions for you.  Blood work, cervical cultures, other necessary tests, and a Pap test are done during your prenatal exams. These tests are done to check on your health and the probable health of your baby. Tests are strongly recommended and done for HIV with your permission. This is the virus that causes AIDS. These tests are done because medicines can be given to help prevent your baby from being born with this infection should you have been infected without knowing it. Blood work is also used to find out your blood type, previous infections, and follow your blood levels (hemoglobin).  Low hemoglobin (anemia) is common during pregnancy. Iron and vitamins are given to help prevent this. Later in the pregnancy,  blood tests for diabetes will be done along with any other tests if any problems develop.  You may need other tests to make sure you and the baby are doing well. CHANGES DURING THE FIRST TRIMESTER  Your body goes through many changes during pregnancy. They vary from person to person. Talk to your caregiver about changes you notice and are concerned about. Changes can include:  Your menstrual period stops.  The egg and sperm carry the genes that determine what you look like. Genes from you and your partner are forming a baby. The female genes determine whether the baby is a boy or a girl.  Your body increases in girth and you may feel bloated.  Feeling sick to your stomach (nauseous) and throwing up (vomiting). If the vomiting is uncontrollable, call your caregiver.  Your breasts will begin to enlarge and become tender.  Your nipples may stick out more and become darker.  The need to urinate more. Painful urination may mean you have a bladder infection.  Tiring easily.  Loss of appetite.  Cravings for certain kinds of food.  At first, you may gain or lose a couple of pounds.  You may have changes in your emotions from day to day (excited to be pregnant or concerned something may go wrong with the pregnancy and baby).  You may have more vivid and strange dreams. HOME CARE INSTRUCTIONS   It is very important to avoid all smoking, alcohol and non-prescribed drugs during your pregnancy. These affect the formation and growth of the baby.   Avoid chemicals while pregnant to ensure the delivery of a healthy infant.  Start your prenatal visits by the 12th week of pregnancy. They are usually scheduled monthly at first, then more often in the last 2 months before delivery. Keep your caregiver's appointments. Follow your caregiver's instructions regarding medicine use, blood and lab tests, exercise, and diet.  During pregnancy, you are providing food for you and your baby. Eat regular,  well-balanced meals. Choose foods such as meat, fish, milk and other low fat dairy products, vegetables, fruits, and whole-grain breads and cereals. Your caregiver will tell you of the ideal weight gain.  You can help morning sickness by keeping soda crackers at the bedside. Eat a couple before arising in the morning. You may want to use the crackers without salt on them.  Eating 4 to 5 small meals rather than 3 large meals a day also may help the nausea and vomiting.  Drinking liquids between meals instead of during meals also seems to help nausea and vomiting.  A physical sexual relationship may be continued throughout pregnancy if there are no other problems. Problems may be early (premature) leaking of amniotic fluid from the membranes, vaginal bleeding, or belly (abdominal) pain.  Exercise regularly if there are no restrictions. Check with your caregiver or physical therapist if you are unsure of the safety of some of your exercises. Greater weight gain will occur in the last 2 trimesters of pregnancy. Exercising will help:  Control your weight.  Keep you in shape.  Prepare you for labor and delivery.  Help you lose your pregnancy weight after you deliver your baby.  Wear a good support or jogging bra for breast tenderness during pregnancy. This may help if worn during sleep too.  Ask when prenatal classes are available. Begin classes when they are offered.  Do not use hot tubs, steam rooms, or saunas.  Wear your seat belt when driving. This protects you and your baby if you are in an accident.  Avoid raw meat, uncooked cheese, cat litter boxes, and soil used by cats throughout the pregnancy. These carry germs that can cause birth defects in the baby.  The first trimester is a good time to visit your dentist for your dental health. Getting your teeth cleaned is okay. Use a softer toothbrush and brush gently during pregnancy.  Ask for help if you have financial, counseling, or  nutritional needs during pregnancy. Your caregiver will be able to offer counseling for these needs as well as refer you for other special needs.  Do not take any medicines or herbs unless told by your caregiver.  Inform your caregiver if there is any mental or physical domestic violence.  Make a list of emergency phone numbers of family, friends, hospital, and police and fire departments.  Write down your questions. Take them to your prenatal visit.  Do not douche.  Do not cross your legs.  If you have to stand for long periods of time, rotate you feet or take small steps in a circle.  You may have more vaginal secretions that may require a sanitary pad. Do not use tampons or scented sanitary pads. MEDICINES AND DRUG USE IN PREGNANCY  Take prenatal vitamins as directed. The vitamin should contain 1 milligram of folic acid. Keep all vitamins out of reach of children. Only a couple vitamins or tablets containing iron may be fatal to a baby or young child when ingested.  Avoid use of all medicines, including herbs, over-the-counter medicines, not   prescribed or suggested by your caregiver. Only take over-the-counter or prescription medicines for pain, discomfort, or fever as directed by your caregiver. Do not use aspirin, ibuprofen, or naproxen unless directed by your caregiver.  Let your caregiver also know about herbs you may be using.  Alcohol is related to a number of birth defects. This includes fetal alcohol syndrome. All alcohol, in any form, should be avoided completely. Smoking will cause low birth rate and premature babies.  Street or illegal drugs are very harmful to the baby. They are absolutely forbidden. A baby born to an addicted mother will be addicted at birth. The baby will go through the same withdrawal an adult does.  Let your caregiver know about any medicines that you have to take and for what reason you take them. SEEK MEDICAL CARE IF:  You have any concerns or  worries during your pregnancy. It is better to call with your questions if you feel they cannot wait, rather than worry about them. SEEK IMMEDIATE MEDICAL CARE IF:   An unexplained oral temperature above 102 F (38.9 C) develops, or as your caregiver suggests.  You have leaking of fluid from the vagina (birth canal). If leaking membranes are suspected, take your temperature and inform your caregiver of this when you call.  There is vaginal spotting or bleeding. Notify your caregiver of the amount and how many pads are used.  You develop a bad smelling vaginal discharge with a change in the color.  You continue to feel sick to your stomach (nauseated) and have no relief from remedies suggested. You vomit blood or coffee ground-like materials.  You lose more than 2 pounds of weight in 1 week.  You gain more than 2 pounds of weight in 1 week and you notice swelling of your face, hands, feet, or legs.  You gain 5 pounds or more in 1 week (even if you do not have swelling of your hands, face, legs, or feet).  You get exposed to German measles and have never had them.  You are exposed to fifth disease or chickenpox.  You develop belly (abdominal) pain. Round ligament discomfort is a common non-cancerous (benign) cause of abdominal pain in pregnancy. Your caregiver still must evaluate this.  You develop headache, fever, diarrhea, pain with urination, or shortness of breath.  You fall or are in a car accident or have any kind of trauma.  There is mental or physical violence in your home. Document Released: 06/16/2001 Document Revised: 03/16/2012 Document Reviewed: 12/18/2008 [employer] Patient Information 2014 [employer].  Breastfeeding Deciding to breastfeed is one of the best choices you can make for you and your baby. A change in hormones during pregnancy causes your breast tissue to grow and increases the number and size of your milk ducts. These hormones also allow proteins,  sugars, and fats from your blood supply to make breast milk in your milk-producing glands. Hormones prevent breast milk from being released before your baby is born as well as prompt milk flow after birth. Once breastfeeding has begun, thoughts of your baby, as well as his or her sucking or crying, can stimulate the release of milk from your milk-producing glands.  BENEFITS OF BREASTFEEDING For Your Baby  Your first milk (colostrum) helps your baby's digestive system function better.   There are antibodies in your milk that help your baby fight off infections.   Your baby has a lower incidence of asthma, allergies, and sudden infant death syndrome.   The nutrients   in breast milk are better for your baby than infant formulas and are designed uniquely for your baby's needs.   Breast milk improves your baby's brain development.   Your baby is less likely to develop other conditions, such as childhood obesity, asthma, or type 2 diabetes mellitus.  For You   Breastfeeding helps to create a very special bond between you and your baby.   Breastfeeding is convenient. Breast milk is always available at the correct temperature and costs nothing.   Breastfeeding helps to burn calories and helps you lose the weight gained during pregnancy.   Breastfeeding makes your uterus contract to its prepregnancy size faster and slows bleeding (lochia) after you give birth.   Breastfeeding helps to lower your risk of developing type 2 diabetes mellitus, osteoporosis, and breast or ovarian cancer later in life. SIGNS THAT YOUR BABY IS HUNGRY Early Signs of Hunger  Increased alertness or activity.  Stretching.  Movement of the head from side to side.  Movement of the head and opening of the mouth when the corner of the mouth or cheek is stroked (rooting).  Increased sucking sounds, smacking lips, cooing, sighing, or squeaking.  Hand-to-mouth movements.  Increased sucking of fingers or  hands. Late Signs of Hunger  Fussing.  Intermittent crying. Extreme Signs of Hunger Signs of extreme hunger will require calming and consoling before your baby will be able to breastfeed successfully. Do not wait for the following signs of extreme hunger to occur before you initiate breastfeeding:   Restlessness.  A loud, strong cry.   Screaming. BREASTFEEDING BASICS Breastfeeding Initiation  Find a comfortable place to sit or lie down, with your neck and back well supported.  Place a pillow or rolled up blanket under your baby to bring him or her to the level of your breast (if you are seated). Nursing pillows are specially designed to help support your arms and your baby while you breastfeed.  Make sure that your baby's abdomen is facing your abdomen.   Gently massage your breast. With your fingertips, massage from your chest wall toward your nipple in a circular motion. This encourages milk flow. You may need to continue this action during the feeding if your milk flows slowly.  Support your breast with 4 fingers underneath and your thumb above your nipple. Make sure your fingers are well away from your nipple and your baby's mouth.   Stroke your baby's lips gently with your finger or nipple.   When your baby's mouth is open wide enough, quickly bring your baby to your breast, placing your entire nipple and as much of the colored area around your nipple (areola) as possible into your baby's mouth.   More areola should be visible above your baby's upper lip than below the lower lip.   Your baby's tongue should be between his or her lower gum and your breast.   Ensure that your baby's mouth is correctly positioned around your nipple (latched). Your baby's lips should create a seal on your breast and be turned out (everted).  It is common for your baby to suck about 2 3 minutes in order to start the flow of breast milk. Latching Teaching your baby how to latch on to your  breast properly is very important. An improper latch can cause nipple pain and decreased milk supply for you and poor weight gain in your baby. Also, if your baby is not latched onto your nipple properly, he or she may swallow some air during   feeding. This can make your baby fussy. Burping your baby when you switch breasts during the feeding can help to get rid of the air. However, teaching your baby to latch on properly is still the best way to prevent fussiness from swallowing air while breastfeeding. Signs that your baby has successfully latched on to your nipple:    Silent tugging or silent sucking, without causing you pain.   Swallowing heard between every 3 4 sucks.    Muscle movement above and in front of his or her ears while sucking.  Signs that your baby has not successfully latched on to nipple:   Sucking sounds or smacking sounds from your baby while breastfeeding.  Nipple pain. If you think your baby has not latched on correctly, slip your finger into the corner of your baby's mouth to break the suction and place it between your baby's gums. Attempt breastfeeding initiation again. Signs of Successful Breastfeeding Signs from your baby:   A gradual decrease in the number of sucks or complete cessation of sucking.   Falling asleep.   Relaxation of his or her body.   Retention of a small amount of milk in his or her mouth.   Letting go of your breast by himself or herself. Signs from you:  Breasts that have increased in firmness, weight, and size 1 3 hours after feeding.   Breasts that are softer immediately after breastfeeding.  Increased milk volume, as well as a change in milk consistency and color by the 5th day of breastfeeding.   Nipples that are not sore, cracked, or bleeding. Signs That Your Baby is Getting Enough Milk  Wetting at least 3 diapers in a 24-hour period. The urine should be clear and pale yellow by age 5 days.  At least 3 stools in a  24-hour period by age 5 days. The stool should be soft and yellow.  At least 3 stools in a 24-hour period by age 7 days. The stool should be seedy and yellow.  No loss of weight greater than 10% of birth weight during the first 3 days of age.  Average weight gain of 4 7 ounces (120 210 mL) per week after age 4 days.  Consistent daily weight gain by age 5 days, without weight loss after the age of 2 weeks. After a feeding, your baby may spit up a small amount. This is common. BREASTFEEDING FREQUENCY AND DURATION Frequent feeding will help you make more milk and can prevent sore nipples and breast engorgement. Breastfeed when you feel the need to reduce the fullness of your breasts or when your baby shows signs of hunger. This is called "breastfeeding on demand." Avoid introducing a pacifier to your baby while you are working to establish breastfeeding (the first 4 6 weeks after your baby is born). After this time you may choose to use a pacifier. Research has shown that pacifier use during the first year of a baby's life decreases the risk of sudden infant death syndrome (SIDS). Allow your baby to feed on each breast as long as he or she wants. Breastfeed until your baby is finished feeding. When your baby unlatches or falls asleep while feeding from the first breast, offer the second breast. Because newborns are often sleepy in the first few weeks of life, you may need to awaken your baby to get him or her to feed. Breastfeeding times will vary from baby to baby. However, the following rules can serve as a guide to help you   ensure that your baby is properly fed:  Newborns (babies 4 weeks of age or younger) may breastfeed every 1 3 hours.  Newborns should not go longer than 3 hours during the day or 5 hours during the night without breastfeeding.  You should breastfeed your baby a minimum of 8 times in a 24-hour period until you begin to introduce solid foods to your baby at around 6 months of  age. BREAST MILK PUMPING Pumping and storing breast milk allows you to ensure that your baby is exclusively fed your breast milk, even at times when you are unable to breastfeed. This is especially important if you are going back to work while you are still breastfeeding or when you are not able to be present during feedings. Your lactation consultant can give you guidelines on how long it is safe to store breast milk.  A breast pump is a machine that allows you to pump milk from your breast into a sterile bottle. The pumped breast milk can then be stored in a refrigerator or freezer. Some breast pumps are operated by hand, while others use electricity. Ask your lactation consultant which type will work best for you. Breast pumps can be purchased, but some hospitals and breastfeeding support groups lease breast pumps on a monthly basis. A lactation consultant can teach you how to hand express breast milk, if you prefer not to use a pump.  CARING FOR YOUR BREASTS WHILE YOU BREASTFEED Nipples can become dry, cracked, and sore while breastfeeding. The following recommendations can help keep your breasts moisturized and healthy:  Avoid using soap on your nipples.   Wear a supportive bra. Although not required, special nursing bras and tank tops are designed to allow access to your breasts for breastfeeding without taking off your entire bra or top. Avoid wearing underwire style bras or extremely tight bras.  Air dry your nipples for 3 4minutes after each feeding.   Use only cotton bra pads to absorb leaked breast milk. Leaking of breast milk between feedings is normal.   Use lanolin on your nipples after breastfeeding. Lanolin helps to maintain your skin's normal moisture barrier. If you use pure lanolin you do not need to wash it off before feeding your baby again. Pure lanolin is not toxic to your baby. You may also hand express a few drops of breast milk and gently massage that milk into your  nipples and allow the milk to air dry. In the first few weeks after giving birth, some women experience extremely full breasts (engorgement). Engorgement can make your breasts feel heavy, warm, and tender to the touch. Engorgement peaks within 3 5 days after you give birth. The following recommendations can help ease engorgement:  Completely empty your breasts while breastfeeding or pumping. You may want to start by applying warm, moist heat (in the shower or with warm water-soaked hand towels) just before feeding or pumping. This increases circulation and helps the milk flow. If your baby does not completely empty your breasts while breastfeeding, pump any extra milk after he or she is finished.  Wear a snug bra (nursing or regular) or tank top for 1 2 days to signal your body to slightly decrease milk production.  Apply ice packs to your breasts, unless this is too uncomfortable for you.  Make sure that your baby is latched on and positioned properly while breastfeeding. If engorgement persists after 48 hours of following these recommendations, contact your health care provider or a lactation consultant. OVERALL   HEALTH CARE RECOMMENDATIONS WHILE BREASTFEEDING  Eat healthy foods. Alternate between meals and snacks, eating 3 of each per day. Because what you eat affects your breast milk, some of the foods may make your baby more irritable than usual. Avoid eating these foods if you are sure that they are negatively affecting your baby.  Drink milk, fruit juice, and water to satisfy your thirst (about 10 glasses a day).   Rest often, relax, and continue to take your prenatal vitamins to prevent fatigue, stress, and anemia.  Continue breast self-awareness checks.  Avoid chewing and smoking tobacco.  Avoid alcohol and drug use. Some medicines that may be harmful to your baby can pass through breast milk. It is important to ask your health care provider before taking any medicine, including all  over-the-counter and prescription medicine as well as vitamin and herbal supplements. It is possible to become pregnant while breastfeeding. If birth control is desired, ask your health care provider about options that will be safe for your baby. SEEK MEDICAL CARE IF:   You feel like you want to stop breastfeeding or have become frustrated with breastfeeding.  You have painful breasts or nipples.  Your nipples are cracked or bleeding.  Your breasts are red, tender, or warm.  You have a swollen area on either breast.  You have a fever or chills.  You have nausea or vomiting.  You have drainage other than breast milk from your nipples.  Your breasts do not become full before feedings by the 5th day after you give birth.  You feel sad and depressed.  Your baby is too sleepy to eat well.  Your baby is having trouble sleeping.   Your baby is wetting less than 3 diapers in a 24-hour period.  Your baby has less than 3 stools in a 24-hour period.  Your baby's skin or the white part of his or her eyes becomes yellow.   Your baby is not gaining weight by 5 days of age. SEEK IMMEDIATE MEDICAL CARE IF:   Your baby is overly tired (lethargic) and does not want to wake up and feed.  Your baby develops an unexplained fever. Document Released: 06/22/2005 Document Revised: 02/22/2013 Document Reviewed: 12/14/2012 [employer] Patient Information 2014 [employer].  

## 2013-06-12 LAB — HEMOGLOBINOPATHY EVALUATION
Hgb A2 Quant: 2 % — ABNORMAL LOW (ref 2.2–3.2)
Hgb A: 98 % — ABNORMAL HIGH (ref 96.8–97.8)
Hgb F Quant: 0 % (ref 0.0–2.0)
Hgb S Quant: 0 %

## 2013-06-20 ENCOUNTER — Other Ambulatory Visit: Payer: Self-pay | Admitting: *Deleted

## 2013-06-20 ENCOUNTER — Encounter: Payer: Self-pay | Admitting: Obstetrics & Gynecology

## 2013-06-20 DIAGNOSIS — E559 Vitamin D deficiency, unspecified: Secondary | ICD-10-CM | POA: Insufficient documentation

## 2013-06-20 HISTORY — DX: Vitamin D deficiency, unspecified: E55.9

## 2013-07-04 ENCOUNTER — Encounter (HOSPITAL_COMMUNITY): Payer: Self-pay | Admitting: *Deleted

## 2013-07-04 ENCOUNTER — Inpatient Hospital Stay (HOSPITAL_COMMUNITY)
Admission: AD | Admit: 2013-07-04 | Discharge: 2013-07-04 | Disposition: A | Discharge status: Home / Self Care | Payer: Medicaid Other | Source: Ambulatory Visit | Attending: Obstetrics | Admitting: Obstetrics

## 2013-07-04 ENCOUNTER — Inpatient Hospital Stay (HOSPITAL_COMMUNITY): Payer: Medicaid Other

## 2013-07-04 DIAGNOSIS — E559 Vitamin D deficiency, unspecified: Secondary | ICD-10-CM

## 2013-07-04 DIAGNOSIS — O26852 Spotting complicating pregnancy, second trimester: Secondary | ICD-10-CM

## 2013-07-04 DIAGNOSIS — Z9884 Bariatric surgery status: Secondary | ICD-10-CM

## 2013-07-04 DIAGNOSIS — O26859 Spotting complicating pregnancy, unspecified trimester: Secondary | ICD-10-CM | POA: Insufficient documentation

## 2013-07-04 DIAGNOSIS — R109 Unspecified abdominal pain: Secondary | ICD-10-CM | POA: Insufficient documentation

## 2013-07-04 DIAGNOSIS — Z3401 Encounter for supervision of normal first pregnancy, first trimester: Secondary | ICD-10-CM

## 2013-07-04 LAB — URINALYSIS, ROUTINE W REFLEX MICROSCOPIC
Hgb urine dipstick: NEGATIVE
Ketones, ur: NEGATIVE mg/dL
Nitrite: NEGATIVE
Protein, ur: NEGATIVE mg/dL
Specific Gravity, Urine: >1.03 — ABNORMAL HIGH (ref 1.005–1.030)
Urobilinogen, UA: 0.2 mg/dL (ref 0.0–1.0)

## 2013-07-04 LAB — WET PREP, GENITAL
Trich, Wet Prep: NONE SEEN
Yeast Wet Prep HPF POC: NONE SEEN

## 2013-07-04 NOTE — MAU Provider Note (Signed)
History     CSN: [id number]  Arrival date and time: 07/04/13 4696   First Provider Initiated Contact with Patient 07/04/13 2028      No chief complaint on file.  HPI  [name] is a 27 y.o. G2P0010 at [redacted]w[redacted]d who presents today with bleeding and cramping. She states that the bleeding started on Sunday, and has been spotty in nature. She states that it is mostly there when she wipes. She states that the cramping is very mild (2/10)  Past Medical History  Diagnosis Date  . No pertinent past medical history   . Anemia   . Morbid obesity   . Sleep apnea     hx of  . Blood transfusion without reported diagnosis 2005    Past Surgical History  Procedure Laterality Date  . Breath tek h pylori  02/09/2012    Procedure: BREATH TEK H PYLORI;  Surgeon: Valarie Merino, MD;  Location: [facility];  Service: General;  Laterality: N/A;  . Gastric bypass  08/29/2012  . Hernia repair  2014    Family History  Problem Relation Age of Onset  . Hypertension Father   . Alcohol abuse Neg Hx   . Arthritis Neg Hx   . Cancer Neg Hx   . Early death Neg Hx   . Heart disease Neg Hx   . Hyperlipidemia Neg Hx   . Kidney disease Neg Hx   . Stroke Neg Hx   . Hypertension Mother     History  Substance Use Topics  . Smoking status: Never Smoker   . Smokeless tobacco: Not on file  . Alcohol Use: No    Allergies: No Known Allergies  Prescriptions prior to admission  Medication Sig Dispense Refill  . acetaminophen (TYLENOL) 500 MG tablet Take 1,000 mg by mouth every 6 (six) hours as needed for moderate pain.      . Cyanocobalamin (B-12) 500 MCG SUBL Place 1 tablet under the tongue.      . IRON PO Take 1 tablet by mouth.      . Multiple Vitamins-Minerals (MULTIVITAMIN PO) Take 1 tablet by mouth.        ROS Physical Exam   Blood pressure 125/74, pulse 92, temperature 98.5 F (36.9 C), temperature source Oral, resp. rate 20, height 5\' 8"  (1.727 m), weight 145.321 kg (320 lb 6  oz), last menstrual period 04/12/2013.  Physical Exam  Nursing note and vitals reviewed. Constitutional: She is oriented to person, place, and time. She appears well-developed and well-nourished. No distress.  Cardiovascular: Normal rate.   Respiratory: Effort normal.  GI: Soft. There is no tenderness.  Genitourinary:   External: no lesion Vagina: small amount of white discharge Cervix: pink, smooth, no CMT Uterus: enlarged, unable to doppler FHT    Neurological: She is alert and oriented to person, place, and time.  Skin: Skin is warm and dry.  Psychiatric: She has a normal mood and affect.    MAU Course  Procedures  Results for orders placed during the hospital encounter of 07/04/13 (from the past 24 hour(s))  URINALYSIS, ROUTINE W REFLEX MICROSCOPIC     Status: Abnormal   Collection Time    07/04/13  8:16 PM      Result Value Range   Color, Urine YELLOW  YELLOW   APPearance CLEAR  CLEAR   Specific Gravity, Urine >1.030 (*) 1.005 - 1.030   pH 5.5  5.0 - 8.0   Glucose, UA NEGATIVE  NEGATIVE mg/dL  Hgb urine dipstick NEGATIVE  NEGATIVE   Bilirubin Urine NEGATIVE  NEGATIVE   Ketones, ur NEGATIVE  NEGATIVE mg/dL   Protein, ur NEGATIVE  NEGATIVE mg/dL   Urobilinogen, UA 0.2  0.0 - 1.0 mg/dL   Nitrite NEGATIVE  NEGATIVE   Leukocytes, UA NEGATIVE  NEGATIVE  WET PREP, GENITAL     Status: Abnormal   Collection Time    07/04/13  8:30 PM      Result Value Range   Yeast Wet Prep HPF POC NONE SEEN  NONE SEEN   Trich, Wet Prep NONE SEEN  NONE SEEN   Clue Cells Wet Prep HPF POC NONE SEEN  NONE SEEN   WBC, Wet Prep HPF POC FEW (*) NONE SEEN      Assessment and Plan  Spotting in pregnancy Viable IUP on [name] Bleeding precautions Second trimester danger signs Resume prenatal care as soon as possible.     Medication List         acetaminophen 500 MG tablet  Commonly known as:  TYLENOL  Take 1,000 mg by mouth daily as needed for moderate pain or headache.     prenatal  multivitamin Tabs tablet  Take 1 tablet by mouth at bedtime.       Follow-up Information   Schedule an appointment as soon as possible for a visit with HARPER,CHARLES A, MD.   Specialty:  Obstetrics and Gynecology   Contact information:   [address] [place] [address] [phone]        Tawnya Crook 07/04/2013, 8:41 PM

## 2013-07-04 NOTE — MAU Note (Signed)
-       PT SAYS SHE  SAW  BLEEDING WHEN SHE WIPED ON Sunday NIGHT , THEN Monday  LIGHT PINK WHEN SHE WIPED.   THIS  MORN  HAD LIGHT PINK WHEN WIPED.   IN TRIAGE - NO PAD  AND NONE IN UNDERWEAR.    CRAMPS STARTED ON Sunday-  NOW  THEY FEEL SAME.    OFFICE CALLED HER ON Monday ABOUT   HER APPOINTMENT-  TOLD MEDICAID   NOT APPROVED YET - SO THEY    SAID THEY WOULD CALL HER BACK FOR AN APPOINTMENT  AND  TOLD HER TO COME HERE IF NEEDED.

## 2013-07-06 NOTE — L&D Delivery Note (Signed)
Delivery Note At 12:44 PM a healthy female was delivered via Vaginal, Spontaneous Delivery (Presentation: Left Occiput Anterior). Came within a few minutes after called for delivery and arrived just as baby emerged and cried immediately, attended by Charlane Ferretti, RN. Emerged with single push before perineum distended. APGAR: 8, 9; weight 7#8.  Placenta status: Intact, Spontaneous.  Cord: 3 vessels with the following complications: some meconium staining of cord and chorion. Uterine atony treated with fundal massage repeatedly, uterine exploration x1 for retrieval of large clots. Cytotec 800 mcg pr given.   Anesthesia: Epidural  Episiotomy: None Lacerations: 1st  Degree Perineal;Bilateral horsehoe shaped deep vaginal lacerations Suture Repair: 3.0 vicryl (2) and 3-0 monofilament Est. Blood Loss (mL): 600  Mom to postpartum.  Baby to Couplet care / Skin to Skin.  [name] 01/15/2014, 1:59 PM

## 2013-07-07 ENCOUNTER — Encounter: Payer: BC Managed Care – PPO | Admitting: Advanced Practice Midwife

## 2013-07-07 ENCOUNTER — Other Ambulatory Visit: Payer: Self-pay | Admitting: *Deleted

## 2013-07-27 ENCOUNTER — Ambulatory Visit (INDEPENDENT_AMBULATORY_CARE_PROVIDER_SITE_OTHER): Payer: Medicaid Other | Admitting: Family Medicine

## 2013-07-27 ENCOUNTER — Encounter: Payer: Self-pay | Admitting: Family Medicine

## 2013-07-27 VITALS — BP 128/68 | Temp 98.2°F | Ht 71.0 in | Wt 322.2 lb

## 2013-07-27 DIAGNOSIS — Z3400 Encounter for supervision of normal first pregnancy, unspecified trimester: Secondary | ICD-10-CM

## 2013-07-27 DIAGNOSIS — O99210 Obesity complicating pregnancy, unspecified trimester: Secondary | ICD-10-CM

## 2013-07-27 DIAGNOSIS — G4730 Sleep apnea, unspecified: Secondary | ICD-10-CM | POA: Insufficient documentation

## 2013-07-27 DIAGNOSIS — E559 Vitamin D deficiency, unspecified: Secondary | ICD-10-CM

## 2013-07-27 DIAGNOSIS — E669 Obesity, unspecified: Secondary | ICD-10-CM

## 2013-07-27 DIAGNOSIS — D509 Iron deficiency anemia, unspecified: Secondary | ICD-10-CM

## 2013-07-27 LAB — POCT URINALYSIS DIP (DEVICE)
BILIRUBIN URINE: NEGATIVE
Glucose, UA: NEGATIVE mg/dL
Hgb urine dipstick: NEGATIVE
KETONES UR: NEGATIVE mg/dL
Nitrite: NEGATIVE
Protein, ur: NEGATIVE mg/dL
Specific Gravity, Urine: 1.025 (ref 1.005–1.030)
Urobilinogen, UA: 0.2 mg/dL (ref 0.0–1.0)
pH: 7 (ref 5.0–8.0)

## 2013-07-27 MED ORDER — ERGOCALCIFEROL 1.25 MG (50000 UT) PO CAPS: 50000.0000 [IU] | ORAL_CAPSULE | ORAL | Status: DC

## 2013-07-27 MED ORDER — OB COMPLETE 400 40-10-1-400 MG PO CAPS: 1.0000 | ORAL_CAPSULE | Freq: Every day | ORAL | Status: DC

## 2013-07-27 NOTE — Progress Notes (Signed)
Nutrition note: 1st visit consult Pt has h/o obesity and had gastric bypass surgery early 2014. Pt stated she has lost ~110# in 73yr. Pt reports eating 4 meals & 4 snacks/d. Pt was taking PNV but ran out 1 wk ago. Pt reports she plans to start after filling rx today as well as a vitamin D supplement. Pt reports having nausea occ but no vomiting or heartburn. NKFA. Pt reports not doing any physical activity. Pt received verbal & written education on general nutrition during pregnancy. Discussed tips to decrease nausea. Discussed wt gain goals of 11-20# or 0.5#/wk. Pt agrees to restart taking PNV. Pt has [place] and plans to BF. F/u in 4-6 wks Vladimir Faster, MS, RD, LDN, [facility]

## 2013-07-27 NOTE — Progress Notes (Signed)
Patient scheduled for her sleep study on 2/2 at 8 pm.

## 2013-07-27 NOTE — Patient Instructions (Signed)
Second Trimester of Pregnancy The second trimester is from week 13 through week 28, months 4 through 6. The second trimester is often a time when you feel your best. Your body has also adjusted to being pregnant, and you begin to feel better physically. Usually, morning sickness has lessened or quit completely, you may have more energy, and you may have an increase in appetite. The second trimester is also a time when the fetus is growing rapidly. At the end of the sixth month, the fetus is about 9 inches long and weighs about 1 pounds. You will likely begin to feel the baby move (quickening) between 18 and 20 weeks of the pregnancy. BODY CHANGES Your body goes through many changes during pregnancy. The changes vary from woman to woman.   Your weight will continue to increase. You will notice your lower abdomen bulging out.  You may begin to get stretch marks on your hips, abdomen, and breasts.  You may develop headaches that can be relieved by medicines approved by your caregiver.  You may urinate more often because the fetus is pressing on your bladder.  You may develop or continue to have heartburn as a result of your pregnancy.  You may develop constipation because certain hormones are causing the muscles that push waste through your intestines to slow down.  You may develop hemorrhoids or swollen, bulging veins (varicose veins).  You may have back pain because of the weight gain and pregnancy hormones relaxing your joints between the bones in your pelvis and as a result of a shift in weight and the muscles that support your balance.  Your breasts will continue to grow and be tender.  Your gums may bleed and may be sensitive to brushing and flossing.  Dark spots or blotches (chloasma, mask of pregnancy) may develop on your face. This will likely fade after the baby is born.  A dark line from your belly button to the pubic area (linea nigra) may appear. This will likely fade after  the baby is born. WHAT TO EXPECT AT YOUR PRENATAL VISITS During a routine prenatal visit:  You will be weighed to make sure you and the fetus are growing normally.  Your blood pressure will be taken.  Your abdomen will be measured to track your baby's growth.  The fetal heartbeat will be listened to.  Any test results from the previous visit will be discussed. Your caregiver may ask you:  How you are feeling.  If you are feeling the baby move.  If you have had any abnormal symptoms, such as leaking fluid, bleeding, severe headaches, or abdominal cramping.  If you have any questions. Other tests that may be performed during your second trimester include:  Blood tests that check for:  Low iron levels (anemia).  Gestational diabetes (between 24 and 28 weeks).  Rh antibodies.  Urine tests to check for infections, diabetes, or protein in the urine.  An ultrasound to confirm the proper growth and development of the baby.  An amniocentesis to check for possible genetic problems.  Fetal screens for spina bifida and Down syndrome. HOME CARE INSTRUCTIONS   Avoid all smoking, herbs, alcohol, and unprescribed drugs. These chemicals affect the formation and growth of the baby.  Follow your caregiver's instructions regarding medicine use. There are medicines that are either safe or unsafe to take during pregnancy.  Exercise only as directed by your caregiver. Experiencing uterine cramps is a good sign to stop exercising.  Continue to eat regular,  healthy meals.  Wear a good support bra for breast tenderness.  Do not use hot tubs, steam rooms, or saunas.  Wear your seat belt at all times when driving.  Avoid raw meat, uncooked cheese, cat litter boxes, and soil used by cats. These carry germs that can cause birth defects in the baby.  Take your prenatal vitamins.  Try taking a stool softener (if your caregiver approves) if you develop constipation. Eat more high-fiber  foods, such as fresh vegetables or fruit and whole grains. Drink plenty of fluids to keep your urine clear or pale yellow.  Take warm sitz baths to soothe any pain or discomfort caused by hemorrhoids. Use hemorrhoid cream if your caregiver approves.  If you develop varicose veins, wear support hose. Elevate your feet for 15 minutes, 3 4 times a day. Limit salt in your diet.  Avoid heavy lifting, wear low heel shoes, and practice good posture.  Rest with your legs elevated if you have leg cramps or low back pain.  Visit your dentist if you have not gone yet during your pregnancy. Use a soft toothbrush to brush your teeth and be gentle when you floss.  A sexual relationship may be continued unless your caregiver directs you otherwise.  Continue to go to all your prenatal visits as directed by your caregiver. SEEK MEDICAL CARE IF:   You have dizziness.  You have mild pelvic cramps, pelvic pressure, or nagging pain in the abdominal area.  You have persistent nausea, vomiting, or diarrhea.  You have a bad smelling vaginal discharge.  You have pain with urination. SEEK IMMEDIATE MEDICAL CARE IF:   You have a fever.  You are leaking fluid from your vagina.  You have spotting or bleeding from your vagina.  You have severe abdominal cramping or pain.  You have rapid weight gain or loss.  You have shortness of breath with chest pain.  You notice sudden or extreme swelling of your face, hands, ankles, feet, or legs.  You have not felt your baby move in over an hour.  You have severe headaches that do not go away with medicine.  You have vision changes. Document Released: 06/16/2001 Document Revised: 02/22/2013 Document Reviewed: 08/23/2012 [facility] Patient Information 2014 [place].  Breastfeeding Deciding to breastfeed is one of the best choices you can make for you and your baby. A change in hormones during pregnancy causes your breast tissue to grow and increases the  number and size of your milk ducts. These hormones also allow proteins, sugars, and fats from your blood supply to make breast milk in your milk-producing glands. Hormones prevent breast milk from being released before your baby is born as well as prompt milk flow after birth. Once breastfeeding has begun, thoughts of your baby, as well as his or her sucking or crying, can stimulate the release of milk from your milk-producing glands.  BENEFITS OF BREASTFEEDING For Your Baby  Your first milk (colostrum) helps your baby's digestive system function better.   There are antibodies in your milk that help your baby fight off infections.   Your baby has a lower incidence of asthma, allergies, and sudden infant death syndrome.   The nutrients in breast milk are better for your baby than infant formulas and are designed uniquely for your baby's needs.   Breast milk improves your baby's brain development.   Your baby is less likely to develop other conditions, such as childhood obesity, asthma, or type 2 diabetes mellitus.  For  You   Breastfeeding helps to create a very special bond between you and your baby.   Breastfeeding is convenient. Breast milk is always available at the correct temperature and costs nothing.   Breastfeeding helps to burn calories and helps you lose the weight gained during pregnancy.   Breastfeeding makes your uterus contract to its prepregnancy size faster and slows bleeding (lochia) after you give birth.   Breastfeeding helps to lower your risk of developing type 2 diabetes mellitus, osteoporosis, and breast or ovarian cancer later in life. SIGNS THAT YOUR BABY IS HUNGRY Early Signs of Hunger  Increased alertness or activity.  Stretching.  Movement of the head from side to side.  Movement of the head and opening of the mouth when the corner of the mouth or cheek is stroked (rooting).  Increased sucking sounds, smacking lips, cooing, sighing, or  squeaking.  Hand-to-mouth movements.  Increased sucking of fingers or hands. Late Signs of Hunger  Fussing.  Intermittent crying. Extreme Signs of Hunger Signs of extreme hunger will require calming and consoling before your baby will be able to breastfeed successfully. Do not wait for the following signs of extreme hunger to occur before you initiate breastfeeding:   Restlessness.  A loud, strong cry.   Screaming. BREASTFEEDING BASICS Breastfeeding Initiation  Find a comfortable place to sit or lie down, with your neck and back well supported.  Place a pillow or rolled up blanket under your baby to bring him or her to the level of your breast (if you are seated). Nursing pillows are specially designed to help support your arms and your baby while you breastfeed.  Make sure that your baby's abdomen is facing your abdomen.   Gently massage your breast. With your fingertips, massage from your chest wall toward your nipple in a circular motion. This encourages milk flow. You may need to continue this action during the feeding if your milk flows slowly.  Support your breast with 4 fingers underneath and your thumb above your nipple. Make sure your fingers are well away from your nipple and your baby's mouth.   Stroke your baby's lips gently with your finger or nipple.   When your baby's mouth is open wide enough, quickly bring your baby to your breast, placing your entire nipple and as much of the colored area around your nipple (areola) as possible into your baby's mouth.   More areola should be visible above your baby's upper lip than below the lower lip.   Your baby's tongue should be between his or her lower gum and your breast.   Ensure that your baby's mouth is correctly positioned around your nipple (latched). Your baby's lips should create a seal on your breast and be turned out (everted).  It is common for your baby to suck about 2 3 minutes in order to start the  flow of breast milk. Latching Teaching your baby how to latch on to your breast properly is very important. An improper latch can cause nipple pain and decreased milk supply for you and poor weight gain in your baby. Also, if your baby is not latched onto your nipple properly, he or she may swallow some air during feeding. This can make your baby fussy. Burping your baby when you switch breasts during the feeding can help to get rid of the air. However, teaching your baby to latch on properly is still the best way to prevent fussiness from swallowing air while breastfeeding. Signs that your baby has  successfully latched on to your nipple:    Silent tugging or silent sucking, without causing you pain.   Swallowing heard between every 3 4 sucks.    Muscle movement above and in front of his or her ears while sucking.  Signs that your baby has not successfully latched on to nipple:   Sucking sounds or smacking sounds from your baby while breastfeeding.  Nipple pain. If you think your baby has not latched on correctly, slip your finger into the corner of your baby's mouth to break the suction and place it between your baby's gums. Attempt breastfeeding initiation again. Signs of Successful Breastfeeding Signs from your baby:   A gradual decrease in the number of sucks or complete cessation of sucking.   Falling asleep.   Relaxation of his or her body.   Retention of a small amount of milk in his or her mouth.   Letting go of your breast by himself or herself. Signs from you:  Breasts that have increased in firmness, weight, and size 1 3 hours after feeding.   Breasts that are softer immediately after breastfeeding.  Increased milk volume, as well as a change in milk consistency and color by the 5th day of breastfeeding.   Nipples that are not sore, cracked, or bleeding. Signs That Your Randel Books is Getting Enough Milk  Wetting at least 3 diapers in a 24-hour period. The urine  should be clear and pale yellow by age 64 days.  At least 3 stools in a 24-hour period by age 64 days. The stool should be soft and yellow.  At least 3 stools in a 24-hour period by age 616 days. The stool should be seedy and yellow.  No loss of weight greater than 10% of birth weight during the first 91 days of age.  Average weight gain of 4 7 ounces (120 210 mL) per week after age 61 days.  Consistent daily weight gain by age 65 days, without weight loss after the age of 2 weeks. After a feeding, your baby may spit up a small amount. This is common. BREASTFEEDING FREQUENCY AND DURATION Frequent feeding will help you make more milk and can prevent sore nipples and breast engorgement. Breastfeed when you feel the need to reduce the fullness of your breasts or when your baby shows signs of hunger. This is called "breastfeeding on demand." Avoid introducing a pacifier to your baby while you are working to establish breastfeeding (the first 4 6 weeks after your baby is born). After this time you may choose to use a pacifier. Research has shown that pacifier use during the first year of a baby's life decreases the risk of sudden infant death syndrome (SIDS). Allow your baby to feed on each breast as long as he or she wants. Breastfeed until your baby is finished feeding. When your baby unlatches or falls asleep while feeding from the first breast, offer the second breast. Because newborns are often sleepy in the first few weeks of life, you may need to awaken your baby to get him or her to feed. Breastfeeding times will vary from baby to baby. However, the following rules can serve as a guide to help you ensure that your baby is properly fed:  Newborns (babies 53 weeks of age or younger) may breastfeed every 1 3 hours.  Newborns should not go longer than 3 hours during the day or 5 hours during the night without breastfeeding.  You should breastfeed your baby a minimum of  8 times in a 24-hour period until  you begin to introduce solid foods to your baby at around 55 months of age. BREAST MILK PUMPING Pumping and storing breast milk allows you to ensure that your baby is exclusively fed your breast milk, even at times when you are unable to breastfeed. This is especially important if you are going back to work while you are still breastfeeding or when you are not able to be present during feedings. Your lactation consultant can give you guidelines on how long it is safe to store breast milk.  A breast pump is a machine that allows you to pump milk from your breast into a sterile bottle. The pumped breast milk can then be stored in a refrigerator or freezer. Some breast pumps are operated by hand, while others use electricity. Ask your lactation consultant which type will work best for you. Breast pumps can be purchased, but some hospitals and breastfeeding support groups lease breast pumps on a monthly basis. A lactation consultant can teach you how to hand express breast milk, if you prefer not to use a pump.  CARING FOR YOUR BREASTS WHILE YOU BREASTFEED Nipples can become dry, cracked, and sore while breastfeeding. The following recommendations can help keep your breasts moisturized and healthy:  Avoid using soap on your nipples.   Wear a supportive bra. Although not required, special nursing bras and tank tops are designed to allow access to your breasts for breastfeeding without taking off your entire bra or top. Avoid wearing underwire style bras or extremely tight bras.  Air dry your nipples for 3 4minutes after each feeding.   Use only cotton bra pads to absorb leaked breast milk. Leaking of breast milk between feedings is normal.   Use lanolin on your nipples after breastfeeding. Lanolin helps to maintain your skin's normal moisture barrier. If you use pure lanolin you do not need to wash it off before feeding your baby again. Pure lanolin is not toxic to your baby. You may also hand express a  few drops of breast milk and gently massage that milk into your nipples and allow the milk to air dry. In the first few weeks after giving birth, some women experience extremely full breasts (engorgement). Engorgement can make your breasts feel heavy, warm, and tender to the touch. Engorgement peaks within 3 5 days after you give birth. The following recommendations can help ease engorgement:  Completely empty your breasts while breastfeeding or pumping. You may want to start by applying warm, moist heat (in the shower or with warm water-soaked hand towels) just before feeding or pumping. This increases circulation and helps the milk flow. If your baby does not completely empty your breasts while breastfeeding, pump any extra milk after he or she is finished.  Wear a snug bra (nursing or regular) or tank top for 1 2 days to signal your body to slightly decrease milk production.  Apply ice packs to your breasts, unless this is too uncomfortable for you.  Make sure that your baby is latched on and positioned properly while breastfeeding. If engorgement persists after 48 hours of following these recommendations, contact your health care provider or a Science writer. OVERALL HEALTH CARE RECOMMENDATIONS WHILE BREASTFEEDING  Eat healthy foods. Alternate between meals and snacks, eating 3 of each per day. Because what you eat affects your breast milk, some of the foods may make your baby more irritable than usual. Avoid eating these foods if you are sure that they are  negatively affecting your baby.  Drink milk, fruit juice, and water to satisfy your thirst (about 10 glasses a day).   Rest often, relax, and continue to take your prenatal vitamins to prevent fatigue, stress, and anemia.  Continue breast self-awareness checks.  Avoid chewing and smoking tobacco.  Avoid alcohol and drug use. Some medicines that may be harmful to your baby can pass through breast milk. It is important to ask your  health care provider before taking any medicine, including all over-the-counter and prescription medicine as well as vitamin and herbal supplements. It is possible to become pregnant while breastfeeding. If birth control is desired, ask your health care provider about options that will be safe for your baby. SEEK MEDICAL CARE IF:   You feel like you want to stop breastfeeding or have become frustrated with breastfeeding.  You have painful breasts or nipples.  Your nipples are cracked or bleeding.  Your breasts are red, tender, or warm.  You have a swollen area on either breast.  You have a fever or chills.  You have nausea or vomiting.  You have drainage other than breast milk from your nipples.  Your breasts do not become full before feedings by the 5th day after you give birth.  You feel sad and depressed.  Your baby is too sleepy to eat well.  Your baby is having trouble sleeping.   Your baby is wetting less than 3 diapers in a 24-hour period.  Your baby has less than 3 stools in a 24-hour period.  Your baby's skin or the white part of his or her eyes becomes yellow.   Your baby is not gaining weight by 5 days of age. SEEK IMMEDIATE MEDICAL CARE IF:   Your baby is overly tired (lethargic) and does not want to wake up and feed.  Your baby develops an unexplained fever. Document Released: 06/22/2005 Document Revised: 02/22/2013 Document Reviewed: 12/14/2012 [employer] Patient Information 2014 [employer].  

## 2013-07-27 NOTE — Progress Notes (Signed)
NewOB transfer from [facility]   Subjective:    [name] is a G2P0010 [redacted]w[redacted]d being seen today for her first obstetrical visit.  Her obstetrical history is significant for obesity and vitamin D deficiency and sleep apnea.  Pregnancy history fully reviewed.  Patient reports nausea, no bleeding and eczematous.  Filed Vitals:   07/27/13 0813 07/27/13 0820  BP: 128/68   Temp: 98.2 F (36.8 C)   Height:  5\' 11"  (1.803 m)  Weight: 322 lb 3.2 oz (146.149 kg)     HISTORY: OB History  Gravida Para Term Preterm AB SAB TAB Ectopic Multiple Living  2    1 1     0    # Outcome Date GA Lbr Len/2nd Weight Sex Delivery Anes PTL Lv  2 CUR           1 SAB 04/24/08 [redacted]w[redacted]d       N     Past Medical History  Diagnosis Date  . No pertinent past medical history   . Anemia   . Morbid obesity   . Sleep apnea     hx of  . Blood transfusion without reported diagnosis 2005   Past Surgical History  Procedure Laterality Date  . Breath tek h pylori  02/09/2012    Procedure: BREATH TEK H PYLORI;  Surgeon: Pedro Earls, MD;  Location: [facility];  Service: General;  Laterality: N/A;  . Gastric bypass  08/29/2012  . Hernia repair  2014   Family History  Problem Relation Age of Onset  . Hypertension Father   . Alcohol abuse Neg Hx   . Arthritis Neg Hx   . Cancer Neg Hx   . Early death Neg Hx   . Heart disease Neg Hx   . Hyperlipidemia Neg Hx   . Kidney disease Neg Hx   . Stroke Neg Hx   . Hypertension Mother      Exam   General: Obesity Uterus:   15 wks size  Pelvic Exam:    Perineum: Normal Perineum   Vulva: Bartholin's, Urethra, Skene's normal   Vagina:  normal mucosa, normal discharge   Cervix: no cervical motion tenderness and no lesions   Adnexa: normal adnexa   Bony Pelvis: average  System: Breast:  normal appearance, no masses or tenderness   Skin: normal coloration and turgor, discoid eczematous rash noted.    Neurologic: normal   Extremities: normal strength,  tone, and muscle mass   HEENT sclera clear, anicteric   Mouth/Teeth mucous membranes moist, pharynx normal without lesions   Neck supple   Cardiovascular: regular rate and rhythm, no murmurs or gallops   Respiratory:  appears well, vitals normal, no respiratory distress, acyanotic, normal RR, ear and throat exam is normal, neck free of mass or lymphadenopathy, chest clear, no wheezing, crepitations, rhonchi, normal symmetric air entry   Abdomen: soft, non-tender; bowel sounds normal; no masses,  no organomegaly      Assessment:    Pregnancy: G2P0010 Patient Active Problem List   Diagnosis Date Noted  . Obesity in pregnancy with antepartum complication 24/23/5361    Priority: High  . Unspecified vitamin D deficiency 06/20/2013    Priority: Medium  . Routine general medical examination at a health care facility 12/04/2011    Priority: Medium  . Sleep apnea 07/27/2013  . 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 Iron deficiency anemia 12/04/2011  . Obesity BMI 60 12/04/2011        Plan:  Initial labs drawn. Prenatal vitamins. Problem list reviewed and updated. Genetic Screening discussed Quad Screen: requested. Ultrasound discussed; fetal survey: ordered. Follow up in 3 weeks. Schedule anatomy.  Needs pap and Vit D replacement. Repeat sleep study for apnea. Pap today. Early 1 hour.   Jaymes Hang S 07/27/2013

## 2013-07-27 NOTE — Progress Notes (Signed)
P= 88 Needs PNV prescription.  Discussed appropriate weight gain for this pregnancy; 11-20lb. Pt. Verbalized understanding.  New OB packet given.  Early 1hr gtt today. Discussed flu vaccine; pt. Will think about it.

## 2013-07-27 NOTE — Addendum Note (Signed)
Addended by: Ernie Avena on: 07/27/2013 02:17 PM   Modules accepted: Orders

## 2013-07-28 ENCOUNTER — Encounter: Payer: Self-pay | Admitting: Family Medicine

## 2013-07-28 LAB — AFP, QUAD SCREEN
AFP: 38.2 IU/mL
Age Alone: 1:860 {titer}
Curr Gest Age: 15.1 wks.days
HCG TOTAL: 25968 m[IU]/mL
INH: 249.3 pg/mL
INTERPRETATION-AFP: NEGATIVE
MOM FOR HCG: 1.36
MOM FOR INH: 1.95
MoM for AFP: 2.35
OPEN SPINA BIFIDA: NEGATIVE
TRI 18 SCR RISK EST: NEGATIVE
Trisomy 18 (Edward) Syndrome Interp.: 1:70200 {titer}
UE3 MOM: 0.78
UE3 VALUE: 0.2 ng/mL

## 2013-07-28 LAB — PRESCRIPTION MONITORING PROFILE (19 PANEL)
Amphetamine/Meth: NEGATIVE ng/mL
BARBITURATE SCREEN, URINE: NEGATIVE ng/mL
BENZODIAZEPINE SCREEN, URINE: NEGATIVE ng/mL
BUPRENORPHINE, URINE: NEGATIVE ng/mL
COCAINE METABOLITES: NEGATIVE ng/mL
Cannabinoid Scrn, Ur: NEGATIVE ng/mL
Carisoprodol, Urine: NEGATIVE ng/mL
Creatinine, Urine: 220.29 mg/dL (ref >20.0)
ECSTASY: NEGATIVE ng/mL
FENTANYL URINE: NEGATIVE ng/mL
MEPERIDINE UR: NEGATIVE ng/mL
Methadone Screen, Urine: NEGATIVE ng/mL
Methaqualone: NEGATIVE ng/mL
Nitrites, Initial: NEGATIVE ug/mL
Opiate Screen, Urine: NEGATIVE ng/mL
Oxycodone Screen, Ur: NEGATIVE ng/mL
Phencyclidine, Ur: NEGATIVE ng/mL
Propoxyphene: NEGATIVE ng/mL
TRAMADOL UR: NEGATIVE ng/mL
Tapentadol, urine: NEGATIVE ng/mL
ZOLPIDEM, URINE: NEGATIVE ng/mL
pH, Initial: 7.1 pH (ref 4.5–8.9)

## 2013-07-28 LAB — GLUCOSE TOLERANCE, 1 HOUR (50G) W/O FASTING: Glucose, 1 Hour GTT: 54 mg/dL — ABNORMAL LOW (ref 70–140)

## 2013-07-31 ENCOUNTER — Encounter: Payer: Self-pay | Admitting: Family Medicine

## 2013-08-03 ENCOUNTER — Encounter: Payer: Self-pay | Admitting: *Deleted

## 2013-08-07 ENCOUNTER — Ambulatory Visit (HOSPITAL_BASED_OUTPATIENT_CLINIC_OR_DEPARTMENT_OTHER): Payer: Medicaid Other | Attending: Family Medicine

## 2013-08-14 ENCOUNTER — Telehealth: Payer: Self-pay | Admitting: *Deleted

## 2013-08-14 NOTE — Telephone Encounter (Signed)
Patient called requesting results. I called patient back and left voicemail that I am returning her call.

## 2013-08-16 NOTE — Telephone Encounter (Signed)
Fairy called back and I informed her of negative quad screen results and also reviewed her next appointment date /time.

## 2013-08-16 NOTE — Telephone Encounter (Signed)
[name] called and left another message she is calling about results of her genetic testing to see if baby is ok.  States may leave message if she doesn't answer. Called Saul and unable to leave message - voicemail is full. Per chart had quad screen which was negative .

## 2013-08-21 ENCOUNTER — Ambulatory Visit (HOSPITAL_COMMUNITY)
Admission: RE | Admit: 2013-08-21 | Discharge: 2013-08-21 | Disposition: A | Discharge status: Home / Self Care | Payer: Medicaid Other | Source: Ambulatory Visit | Attending: Family Medicine | Admitting: Family Medicine

## 2013-08-21 DIAGNOSIS — O99210 Obesity complicating pregnancy, unspecified trimester: Secondary | ICD-10-CM

## 2013-08-21 DIAGNOSIS — E669 Obesity, unspecified: Secondary | ICD-10-CM | POA: Insufficient documentation

## 2013-08-21 DIAGNOSIS — D509 Iron deficiency anemia, unspecified: Secondary | ICD-10-CM

## 2013-08-21 DIAGNOSIS — O358XX0 Maternal care for other (suspected) fetal abnormality and damage, not applicable or unspecified: Secondary | ICD-10-CM | POA: Insufficient documentation

## 2013-08-21 DIAGNOSIS — G4730 Sleep apnea, unspecified: Secondary | ICD-10-CM

## 2013-08-21 DIAGNOSIS — E559 Vitamin D deficiency, unspecified: Secondary | ICD-10-CM

## 2013-08-21 DIAGNOSIS — Z363 Encounter for antenatal screening for malformations: Secondary | ICD-10-CM | POA: Insufficient documentation

## 2013-08-21 DIAGNOSIS — Z3400 Encounter for supervision of normal first pregnancy, unspecified trimester: Secondary | ICD-10-CM

## 2013-08-21 DIAGNOSIS — Z1389 Encounter for screening for other disorder: Secondary | ICD-10-CM | POA: Insufficient documentation

## 2013-08-22 ENCOUNTER — Encounter: Payer: Self-pay | Admitting: Family Medicine

## 2013-08-24 ENCOUNTER — Ambulatory Visit (INDEPENDENT_AMBULATORY_CARE_PROVIDER_SITE_OTHER): Payer: Medicaid Other | Admitting: Obstetrics & Gynecology

## 2013-08-24 VITALS — BP 125/75 | Temp 98.1°F | Wt 321.4 lb

## 2013-08-24 DIAGNOSIS — Z2821 Immunization not carried out because of patient refusal: Secondary | ICD-10-CM | POA: Insufficient documentation

## 2013-08-24 DIAGNOSIS — E559 Vitamin D deficiency, unspecified: Secondary | ICD-10-CM

## 2013-08-24 DIAGNOSIS — G4730 Sleep apnea, unspecified: Secondary | ICD-10-CM

## 2013-08-24 DIAGNOSIS — O99210 Obesity complicating pregnancy, unspecified trimester: Secondary | ICD-10-CM

## 2013-08-24 DIAGNOSIS — O09522 Supervision of elderly multigravida, second trimester: Secondary | ICD-10-CM

## 2013-08-24 DIAGNOSIS — Z3400 Encounter for supervision of normal first pregnancy, unspecified trimester: Secondary | ICD-10-CM

## 2013-08-24 DIAGNOSIS — D509 Iron deficiency anemia, unspecified: Secondary | ICD-10-CM

## 2013-08-24 LAB — POCT URINALYSIS DIP (DEVICE)
BILIRUBIN URINE: NEGATIVE
GLUCOSE, UA: NEGATIVE mg/dL
HGB URINE DIPSTICK: NEGATIVE
Nitrite: NEGATIVE
Protein, ur: NEGATIVE mg/dL
SPECIFIC GRAVITY, URINE: 1.025 (ref 1.005–1.030)
UROBILINOGEN UA: 0.2 mg/dL (ref 0.0–1.0)
pH: 5.5 (ref 5.0–8.0)

## 2013-08-24 MED ORDER — ERGOCALCIFEROL 1.25 MG (50000 UT) PO CAPS: 50000.0000 [IU] | ORAL_CAPSULE | ORAL | Status: DC

## 2013-08-24 MED ORDER — FERROUS SULFATE 325 (65 FE) MG PO TABS: 325.0000 mg | ORAL_TABLET | Freq: Two times a day (BID) | ORAL | Status: DC

## 2013-08-24 MED ORDER — CLOBETASOL PROPIONATE 0.05 % EX OINT: TOPICAL_OINTMENT | CUTANEOUS | Status: DC

## 2013-08-24 NOTE — Progress Notes (Signed)
Pulse- 83 Patient reports sleep study rescheduled for march 1

## 2013-08-24 NOTE — Progress Notes (Signed)
U/S scheduled 09/21/13 at 845 am.

## 2013-08-24 NOTE — Progress Notes (Signed)
Declines flu shot at this time.  Continue 50K units vit D weekly then go to 800 units.  Recheck labs at 28 weeks.  F?U anat [name] ordered.  Iron prescribed for ferritin = 1.   Pt complaining of dry patch of skin that itches.  Area ? Eczema.  Will prescribe temovate bid for one week then once a day for one week. Urine culture sent (leuk est on UA)

## 2013-08-26 LAB — CULTURE, OB URINE: Colony Count: 30000

## 2013-08-28 ENCOUNTER — Telehealth: Payer: Self-pay | Admitting: General Practice

## 2013-08-28 NOTE — Telephone Encounter (Signed)
Patient called and left message stating she is wondering what medicine she can take for cough and sore throat and is wondering if she can take cough drops and mucinex. Called patient back and informed her that mucinex plain or DM is safe to take as well as cough drops and Robitussin as well. Patient verbalized understanding and had no further questions

## 2013-09-03 ENCOUNTER — Ambulatory Visit (HOSPITAL_BASED_OUTPATIENT_CLINIC_OR_DEPARTMENT_OTHER): Payer: Medicaid Other | Attending: Family Medicine

## 2013-09-03 VITALS — Ht 70.0 in | Wt 321.0 lb

## 2013-09-03 DIAGNOSIS — G4734 Idiopathic sleep related nonobstructive alveolar hypoventilation: Secondary | ICD-10-CM | POA: Insufficient documentation

## 2013-09-03 DIAGNOSIS — Z9884 Bariatric surgery status: Secondary | ICD-10-CM | POA: Insufficient documentation

## 2013-09-03 DIAGNOSIS — G4733 Obstructive sleep apnea (adult) (pediatric): Secondary | ICD-10-CM | POA: Insufficient documentation

## 2013-09-03 DIAGNOSIS — R059 Cough, unspecified: Secondary | ICD-10-CM | POA: Insufficient documentation

## 2013-09-03 DIAGNOSIS — R05 Cough: Secondary | ICD-10-CM | POA: Insufficient documentation

## 2013-09-09 NOTE — Sleep Study (Signed)
   NAME: [name] DATE OF BIRTH:  January 19, 1986 MEDICAL RECORD NUMBER [id number]  LOCATION: [facility]  PHYSICIAN: YOUNG,CLINTON D  DATE OF STUDY: 09/03/2013  SLEEP STUDY TYPE: Nocturnal Polysomnogram               REFERRING PHYSICIAN: Janith Lima, MD  INDICATION FOR STUDY: Hypersomnia with sleep apnea  EPWORTH SLEEPINESS SCORE:   3/24 HEIGHT: 5\' 10"  (177.8 cm)  WEIGHT: 321 lb (145.605 kg)    Body mass index is 46.06 kg/(m^2).  NECK SIZE: 14.5 in.  MEDICATIONS: Charted for review  SLEEP ARCHITECTURE: Total sleep time 265.5 minutes with sleep efficiency 72%. Stage I was 2.1%, stage II 83.6%, stage III 6.6%, REM 7.7% of total sleep time. Sleep latency 26 minutes, REM latency 139.5 minutes, awake after sleep onset 76.5 minutes, arousal index 11.3.  RESPIRATORY DATA: Apnea hypotony index (AHI) 13.6 per hour. 60 total events scored including one obstructive apnea and 59 hypopneas. Events in all sleep positions, especially supine. REM AHI 52.7 per hour. The study was ordered as a diagnostic polysomnogram without CPAP.  OXYGEN DATA: Moderate snoring with oxygen desaturation to a nadir of 76% and mean oxygen saturation through the study of 94.2% on room air  CARDIAC DATA: Sinus rhythm with PVCs  MOVEMENT/PARASOMNIA: No significant movement disturbance, bathroom x1  IMPRESSION/ RECOMMENDATION:   1) Mild obstructive sleep apnea/hypoxia syndrome, AHI 13.6 per hour with non-positional events. Moderate snoring with oxygen desaturation to a nadir of 76% and a mean oxygen saturation through the study of 94.2% on room air. 2) The study was ordered as a diagnostic polysomnogram. The patient can return for dedicated CPAP titration study if appropriate. 3) The patient was noted to cough repeatedly during this study. 4)  Previous diagnostic polysomnogram on 10/31/2009 had recorded AHI of 129.4 events per hour. She has reportedly had bariatric surgery since that  time  Signed Baird Lyons M.D. [name] Diplomate, American Board of Sleep Medicine  ELECTRONICALLY SIGNED ON:  09/09/2013, 9:19 AM New Site PH: [phone]   FX: [phone] [place]

## 2013-09-21 ENCOUNTER — Ambulatory Visit (INDEPENDENT_AMBULATORY_CARE_PROVIDER_SITE_OTHER): Payer: Medicaid Other | Admitting: Family

## 2013-09-21 ENCOUNTER — Ambulatory Visit (HOSPITAL_COMMUNITY)
Admission: RE | Admit: 2013-09-21 | Discharge: 2013-09-21 | Disposition: A | Discharge status: Home / Self Care | Payer: Medicaid Other | Source: Ambulatory Visit | Attending: Obstetrics & Gynecology | Admitting: Obstetrics & Gynecology

## 2013-09-21 VITALS — BP 105/66 | Temp 96.8°F | Wt 321.2 lb

## 2013-09-21 DIAGNOSIS — E669 Obesity, unspecified: Secondary | ICD-10-CM | POA: Insufficient documentation

## 2013-09-21 DIAGNOSIS — Z23 Encounter for immunization: Secondary | ICD-10-CM

## 2013-09-21 DIAGNOSIS — Z3689 Encounter for other specified antenatal screening: Secondary | ICD-10-CM | POA: Insufficient documentation

## 2013-09-21 DIAGNOSIS — O99210 Obesity complicating pregnancy, unspecified trimester: Principal | ICD-10-CM

## 2013-09-21 DIAGNOSIS — O09522 Supervision of elderly multigravida, second trimester: Secondary | ICD-10-CM

## 2013-09-21 DIAGNOSIS — O9989 Other specified diseases and conditions complicating pregnancy, childbirth and the puerperium: Secondary | ICD-10-CM

## 2013-09-21 DIAGNOSIS — R8271 Bacteriuria: Secondary | ICD-10-CM

## 2013-09-21 LAB — POCT URINALYSIS DIP (DEVICE)
Bilirubin Urine: NEGATIVE
GLUCOSE, UA: NEGATIVE mg/dL
Hgb urine dipstick: NEGATIVE
Ketones, ur: NEGATIVE mg/dL
Nitrite: NEGATIVE
Protein, ur: NEGATIVE mg/dL
SPECIFIC GRAVITY, URINE: 1.02 (ref 1.005–1.030)
Urobilinogen, UA: 1 mg/dL (ref 0.0–1.0)
pH: 7 (ref 5.0–8.0)

## 2013-09-21 MED ORDER — VITAMIN D 400 UNITS PO TABS: 800.0000 [IU] | ORAL_TABLET | Freq: Every day | ORAL | Status: DC

## 2013-09-21 NOTE — Progress Notes (Signed)
No questions or concerns; reviewed ultrasound from today, appropriate growth, still unable to assess full anatomy secondary to pt weight.  Vit D switched to 800 U daily; will check vit D level at next visit with 28 wk labs.

## 2013-09-21 NOTE — Progress Notes (Signed)
P-89 

## 2013-09-23 LAB — CULTURE, OB URINE

## 2013-10-07 IMAGING — RF DG UGI W/ KUB
15 of 24 series · 15 of 24 positions shown · non-contrast
Comparison: None.

CLINICAL DATA: Preop for bariatric surgery, morbid obesity

UPPER GI SERIES WITH KUB
TECHNIQUE: Routine upper GI series was performed with thin and
high density barium.
Fluoroscopy Time: 3.3 minutes

[Series 1: run · 1 of 1 slices shown (1 of 14)]
[im 1/1]
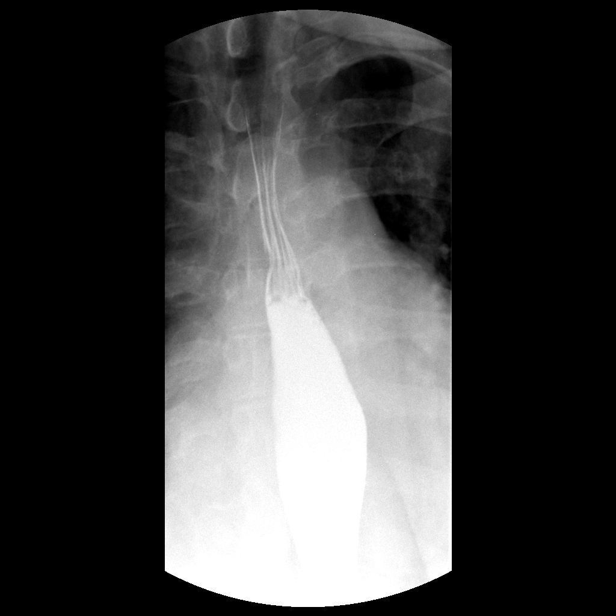

[Series 3: run · 1 of 1 slices shown (2 of 14)]
[im 1/1]
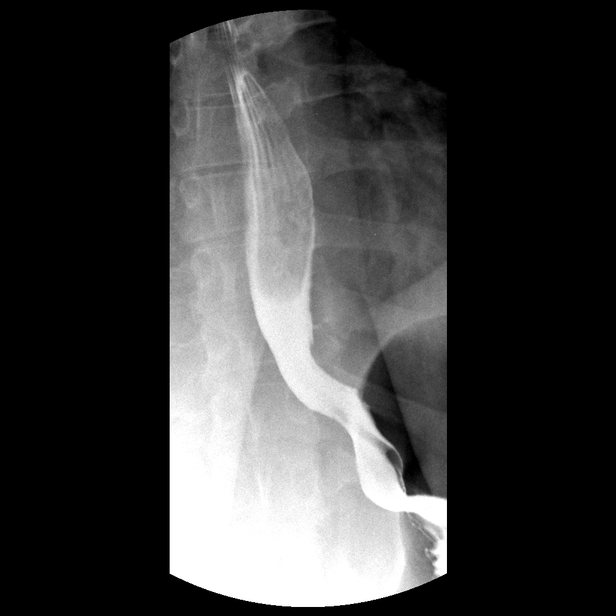

[Series 5: run · 1 of 1 slices shown (3 of 14)]
[im 1/1]
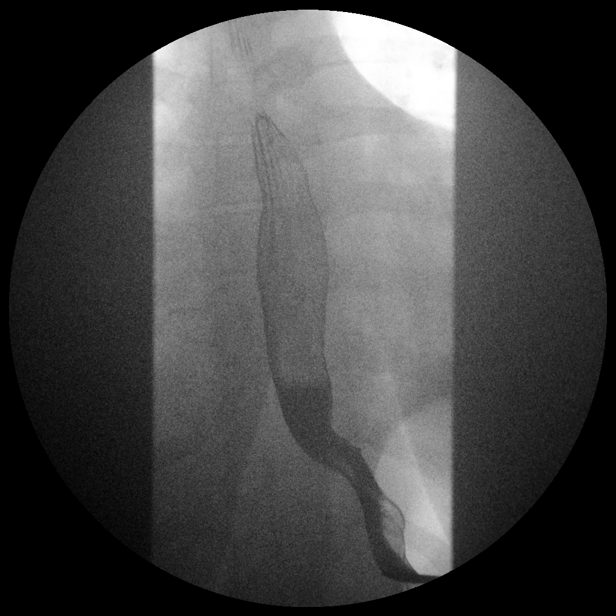

[Series 6: run · 1 of 1 slices shown (4 of 14)]
[im 1/1]
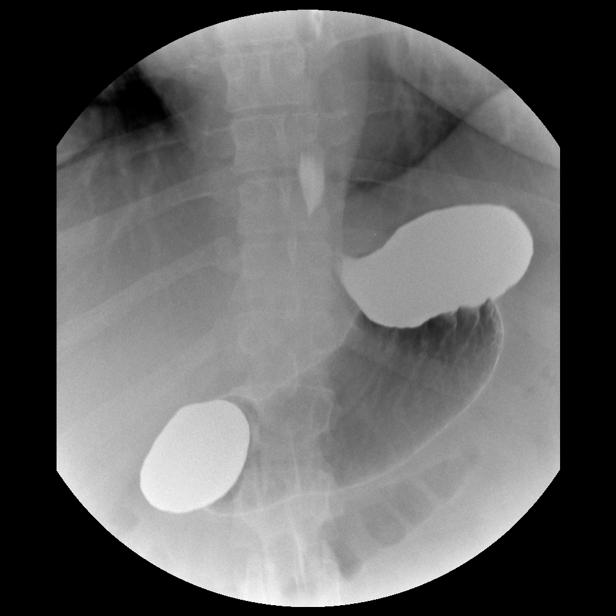

[Series 8: run · 1 of 1 slices shown (5 of 14)]
[im 1/1]
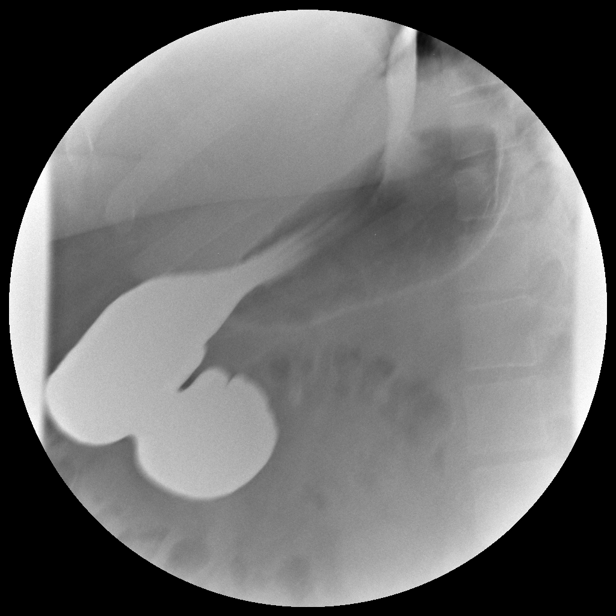

[Series 9: run · 1 of 1 slices shown (6 of 14)]
[im 1/1]
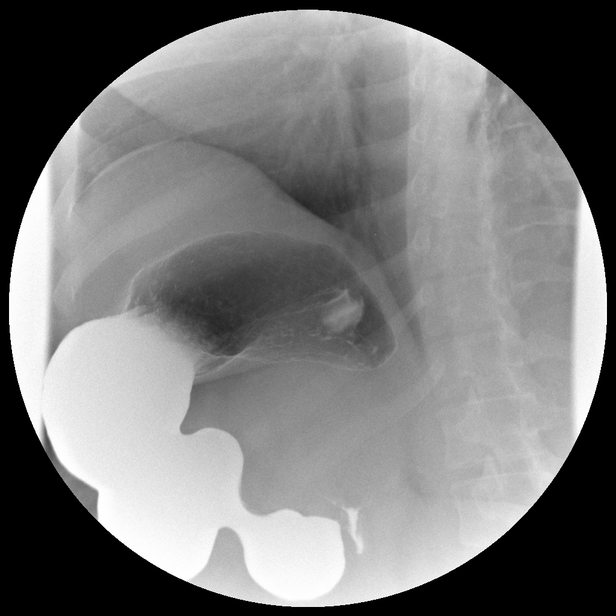

[Series 11: run · 1 of 1 slices shown (7 of 14)]
[im 1/1]
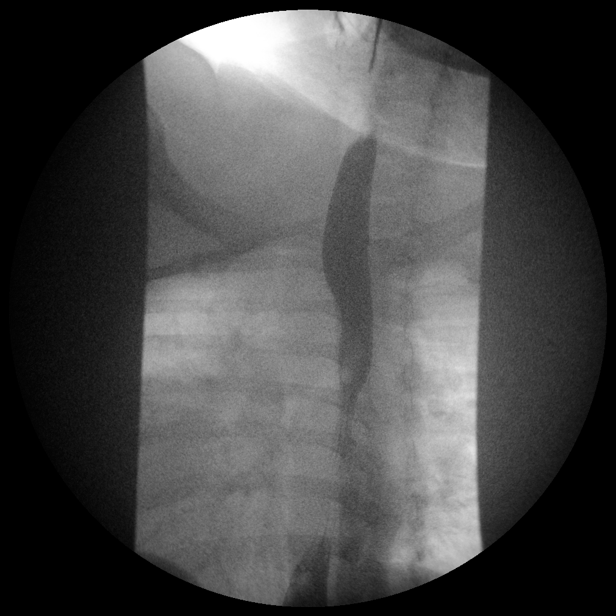

[Series 13: run · 1 of 1 slices shown (8 of 14)]
[im 1/1]
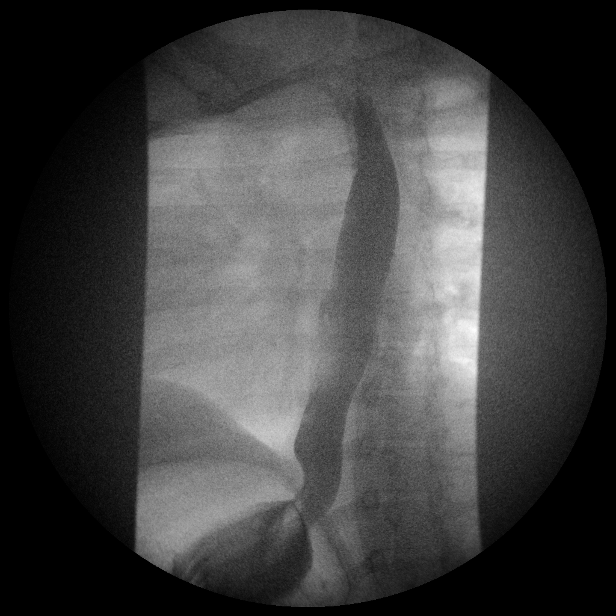

[Series 14: run · 1 of 1 slices shown (9 of 14)]
[im 1/1]
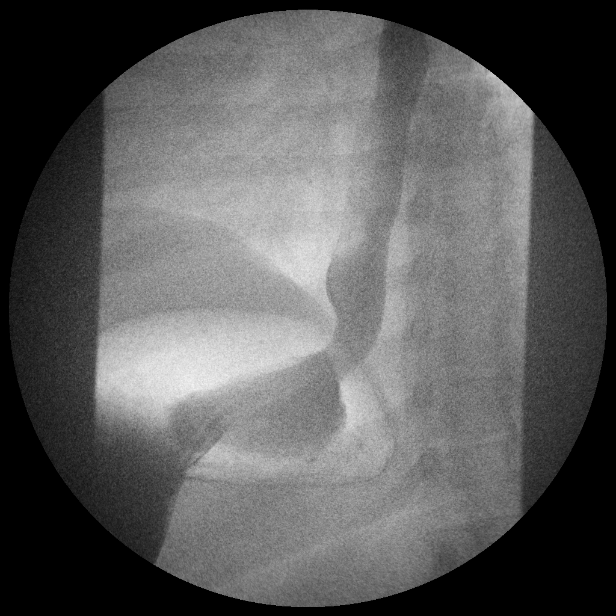

[Series 16: run · 1 of 1 slices shown (10 of 14)]
[im 1/1]
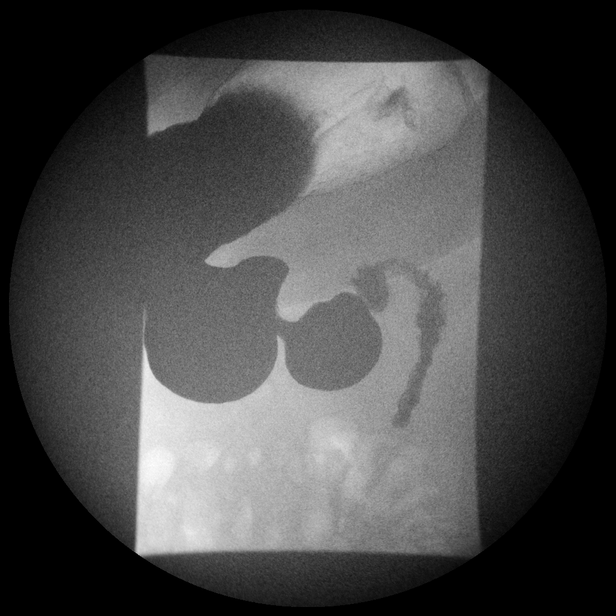

[Series 17: run · 1 of 1 slices shown (11 of 14)]
[im 1/1]
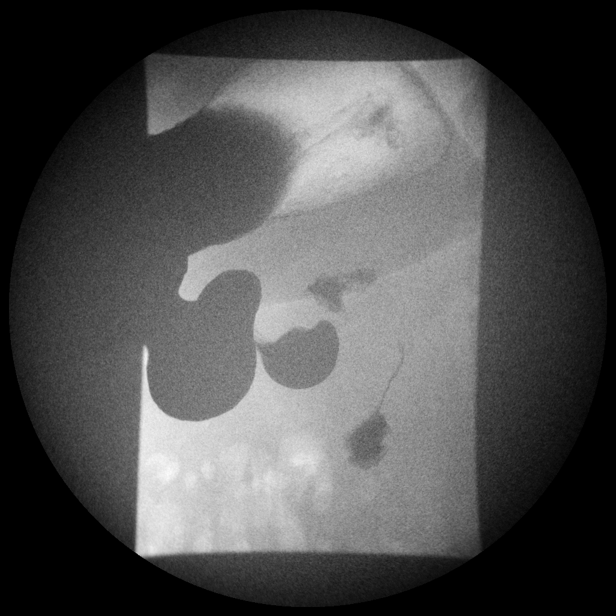

[Series 19: run · 1 of 1 slices shown (12 of 14)]
[im 1/1]
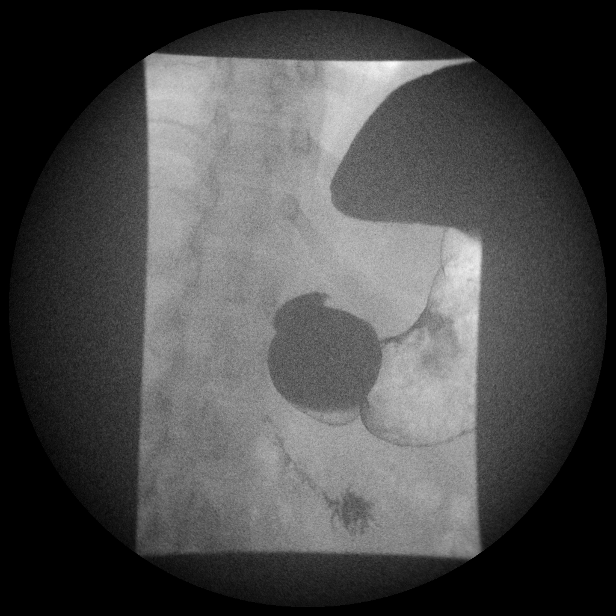

[Series 21: run · 1 of 1 slices shown (13 of 14)]
[im 1/1]
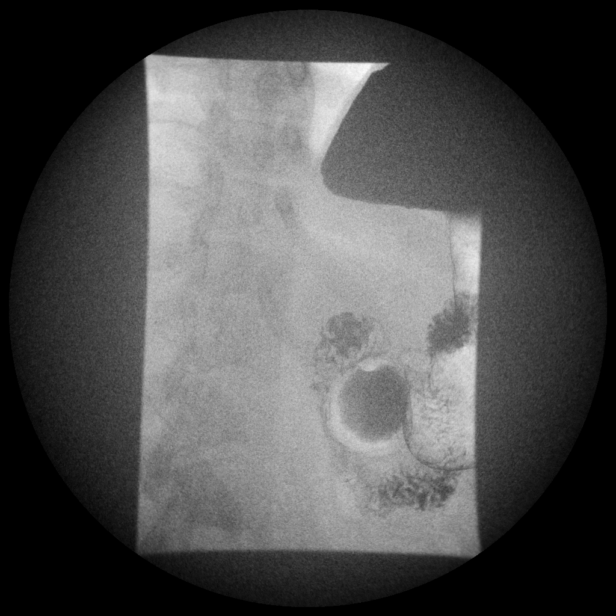

[Series 22: run · 1 of 1 slices shown (14 of 14)]
[im 1/1]
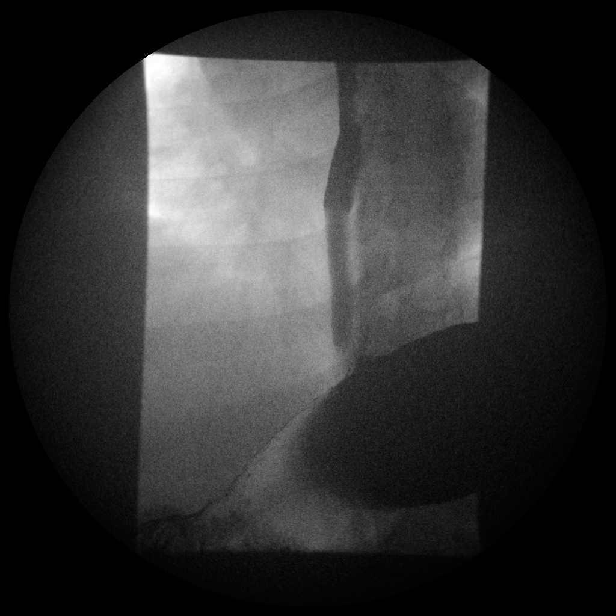

[Series 1001: view not recorded · 0.20mm/px · 1 of 1 slices shown]
[im 1/1]
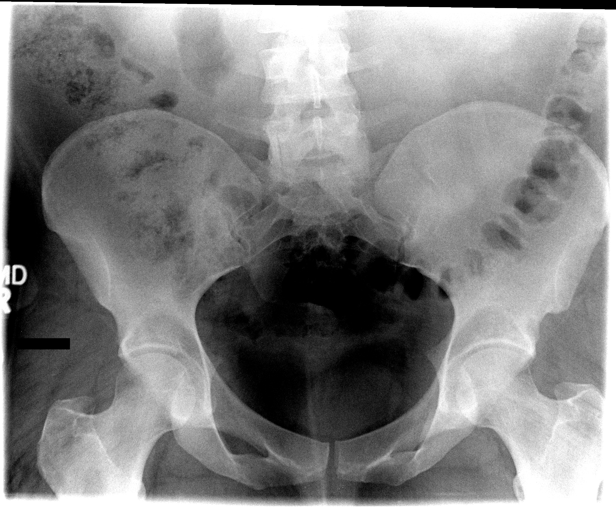

[15 of 24 positions shown; findings below may reference images not displayed]

FINDINGS: A preliminary film of the abdomen shows a nonspecific
bowel gas pattern.  No opaque calculi are noted.  The bones appear
normal.

A double contrast study was performed.  The mucosa of the esophagus
is unremarkable.  A single contrast study shows the swallowing
mechanism to be normal.  Esophageal peristalsis is normal.  No
hiatal hernia is seen.  However, there is moderate gastroesophageal
reflux demonstrated.

The stomach is normal in contour and peristalsis.  The duodenal
bulb fills and the duodenal loop is in normal position.
IMPRESSION: 1.  Moderate gastroesophageal reflux.
2.  Otherwise negative upper GI.

## 2013-10-19 ENCOUNTER — Ambulatory Visit (INDEPENDENT_AMBULATORY_CARE_PROVIDER_SITE_OTHER): Payer: Medicaid Other | Admitting: Family

## 2013-10-19 VITALS — BP 107/67 | Temp 98.6°F | Wt 321.0 lb

## 2013-10-19 DIAGNOSIS — Z23 Encounter for immunization: Secondary | ICD-10-CM

## 2013-10-19 DIAGNOSIS — E669 Obesity, unspecified: Secondary | ICD-10-CM

## 2013-10-19 DIAGNOSIS — O99210 Obesity complicating pregnancy, unspecified trimester: Secondary | ICD-10-CM

## 2013-10-19 LAB — CBC
HCT: 26.5 % — ABNORMAL LOW (ref 36.0–46.0)
Hemoglobin: 7.7 g/dL — ABNORMAL LOW (ref 12.0–15.0)
MCH: 18.4 pg — ABNORMAL LOW (ref 26.0–34.0)
MCHC: 29.1 g/dL — AB (ref 30.0–36.0)
MCV: 63.4 fL — ABNORMAL LOW (ref 78.0–100.0)
PLATELETS: 243 10*3/uL (ref 150–400)
RBC: 4.18 MIL/uL (ref 3.87–5.11)
RDW: 19.3 % — AB (ref 11.5–15.5)
WBC: 6.5 10*3/uL (ref 4.0–10.5)

## 2013-10-19 LAB — POCT URINALYSIS DIP (DEVICE)
GLUCOSE, UA: NEGATIVE mg/dL
Hgb urine dipstick: NEGATIVE
Ketones, ur: NEGATIVE mg/dL
NITRITE: NEGATIVE
Protein, ur: NEGATIVE mg/dL
Specific Gravity, Urine: 1.02 (ref 1.005–1.030)
UROBILINOGEN UA: 1 mg/dL (ref 0.0–1.0)
pH: 6 (ref 5.0–8.0)

## 2013-10-19 LAB — HIV ANTIBODY (ROUTINE TESTING W REFLEX): HIV: NONREACTIVE

## 2013-10-19 LAB — GLUCOSE TOLERANCE, 1 HOUR (50G) W/O FASTING: Glucose, 1 Hour GTT: 109 mg/dL (ref 70–140)

## 2013-10-19 LAB — RPR

## 2013-10-19 MED ORDER — TETANUS-DIPHTH-ACELL PERTUSSIS 5-2.5-18.5 LF-MCG/0.5 IM SUSP: 0.5000 mL | Freq: Once | INTRAMUSCULAR | Status: DC

## 2013-10-19 NOTE — Progress Notes (Signed)
No questions or concerns.  Reports doing early glucola without difficulty.  Repeat glucola today, also obtain vit D level.  Schedule growth [name].

## 2013-10-19 NOTE — Progress Notes (Signed)
U/S scheduled 10/26/13 at 815 am.

## 2013-10-19 NOTE — Progress Notes (Signed)
P=90,

## 2013-10-20 LAB — VITAMIN D 25 HYDROXY (VIT D DEFICIENCY, FRACTURES): VIT D 25 HYDROXY: 33 ng/mL (ref 30–89)

## 2013-10-21 ENCOUNTER — Encounter: Payer: Self-pay | Admitting: Family

## 2013-10-26 ENCOUNTER — Ambulatory Visit (HOSPITAL_COMMUNITY)
Admission: RE | Admit: 2013-10-26 | Discharge: 2013-10-26 | Disposition: A | Discharge status: Home / Self Care | Payer: Medicaid Other | Source: Ambulatory Visit | Attending: Family | Admitting: Family

## 2013-10-26 DIAGNOSIS — Z3689 Encounter for other specified antenatal screening: Secondary | ICD-10-CM | POA: Insufficient documentation

## 2013-10-26 DIAGNOSIS — O99210 Obesity complicating pregnancy, unspecified trimester: Secondary | ICD-10-CM

## 2013-10-26 DIAGNOSIS — E669 Obesity, unspecified: Secondary | ICD-10-CM | POA: Insufficient documentation

## 2013-10-30 ENCOUNTER — Encounter: Payer: Self-pay | Admitting: Family

## 2013-11-02 ENCOUNTER — Ambulatory Visit (INDEPENDENT_AMBULATORY_CARE_PROVIDER_SITE_OTHER): Payer: Medicaid Other | Admitting: Obstetrics & Gynecology

## 2013-11-02 VITALS — BP 129/79 | HR 92 | Temp 97.6°F | Wt 326.1 lb

## 2013-11-02 DIAGNOSIS — D509 Iron deficiency anemia, unspecified: Secondary | ICD-10-CM

## 2013-11-02 DIAGNOSIS — E669 Obesity, unspecified: Secondary | ICD-10-CM

## 2013-11-02 DIAGNOSIS — O99210 Obesity complicating pregnancy, unspecified trimester: Secondary | ICD-10-CM

## 2013-11-02 LAB — POCT URINALYSIS DIP (DEVICE)
Bilirubin Urine: NEGATIVE
Glucose, UA: NEGATIVE mg/dL
NITRITE: NEGATIVE
Protein, ur: 30 mg/dL — AB
UROBILINOGEN UA: 2 mg/dL — AB (ref 0.0–1.0)
pH: 7 (ref 5.0–8.0)

## 2013-11-02 MED ORDER — FERUMOXYTOL INJECTION 510 MG/17 ML: 510.0000 mg | Freq: Once | INTRAVENOUS | Status: DC

## 2013-11-02 NOTE — Progress Notes (Signed)
Hb 7.7, recommend IV Fe. [name] 4/23 nl growth

## 2013-11-02 NOTE — Patient Instructions (Signed)

## 2013-11-02 NOTE — Progress Notes (Signed)
Edema-feet   

## 2013-11-04 ENCOUNTER — Inpatient Hospital Stay (HOSPITAL_COMMUNITY)
Admission: AD | Admit: 2013-11-04 | Discharge: 2013-11-04 | Disposition: A | Discharge status: Home / Self Care | Payer: Medicaid Other | Source: Ambulatory Visit | Attending: Obstetrics & Gynecology | Admitting: Obstetrics & Gynecology

## 2013-11-04 ENCOUNTER — Encounter (HOSPITAL_COMMUNITY): Payer: Self-pay | Admitting: *Deleted

## 2013-11-04 DIAGNOSIS — Z6841 Body Mass Index (BMI) 40.0 and over, adult: Secondary | ICD-10-CM | POA: Insufficient documentation

## 2013-11-04 DIAGNOSIS — O99210 Obesity complicating pregnancy, unspecified trimester: Secondary | ICD-10-CM

## 2013-11-04 DIAGNOSIS — D509 Iron deficiency anemia, unspecified: Secondary | ICD-10-CM | POA: Insufficient documentation

## 2013-11-04 DIAGNOSIS — O99019 Anemia complicating pregnancy, unspecified trimester: Secondary | ICD-10-CM | POA: Insufficient documentation

## 2013-11-04 DIAGNOSIS — E669 Obesity, unspecified: Secondary | ICD-10-CM | POA: Insufficient documentation

## 2013-11-04 MED ORDER — SODIUM CHLORIDE 0.9 % IV SOLN
510.0000 mg | Freq: Once | INTRAVENOUS | Status: AC
  Administered 2013-11-04: 510 mg via INTRAVENOUS
  Filled 2013-11-04: qty 17

## 2013-11-04 MED ORDER — SODIUM CHLORIDE 0.9 % IV SOLN
INTRAVENOUS | Status: DC
  Administered 2013-11-04: 13:00:00 via INTRAVENOUS

## 2013-11-04 NOTE — MAU Note (Signed)
Pt presents to MAU for iron infusion.

## 2013-11-22 ENCOUNTER — Encounter: Payer: Self-pay | Admitting: Advanced Practice Midwife

## 2013-11-22 ENCOUNTER — Encounter: Payer: Medicaid Other | Admitting: Obstetrics and Gynecology

## 2013-11-22 ENCOUNTER — Ambulatory Visit (INDEPENDENT_AMBULATORY_CARE_PROVIDER_SITE_OTHER): Payer: Medicaid Other | Admitting: Advanced Practice Midwife

## 2013-11-22 VITALS — BP 134/80 | HR 86 | Wt 324.7 lb

## 2013-11-22 DIAGNOSIS — O99210 Obesity complicating pregnancy, unspecified trimester: Secondary | ICD-10-CM

## 2013-11-22 DIAGNOSIS — E669 Obesity, unspecified: Secondary | ICD-10-CM

## 2013-11-22 LAB — CBC
HEMATOCRIT: 32.2 % — AB (ref 36.0–46.0)
HEMOGLOBIN: 9.7 g/dL — AB (ref 12.0–15.0)
MCH: 20.5 pg — ABNORMAL LOW (ref 26.0–34.0)
MCHC: 30.1 g/dL (ref 30.0–36.0)
MCV: 67.9 fL — ABNORMAL LOW (ref 78.0–100.0)
Platelets: 316 10*3/uL (ref 150–400)
RBC: 4.74 MIL/uL (ref 3.87–5.11)
RDW: 23.7 % — ABNORMAL HIGH (ref 11.5–15.5)
WBC: 8.7 10*3/uL (ref 4.0–10.5)

## 2013-11-22 LAB — POCT URINALYSIS DIP (DEVICE)
Bilirubin Urine: NEGATIVE
GLUCOSE, UA: NEGATIVE mg/dL
Hgb urine dipstick: NEGATIVE
KETONES UR: NEGATIVE mg/dL
NITRITE: NEGATIVE
Protein, ur: NEGATIVE mg/dL
Specific Gravity, Urine: 1.02 (ref 1.005–1.030)
Urobilinogen, UA: 1 mg/dL (ref 0.0–1.0)
pH: 7.5 (ref 5.0–8.0)

## 2013-11-22 NOTE — Progress Notes (Signed)
Will check CBC today. [name] followup for anatomy and growth scheduled for early June, per Radiology recommendations

## 2013-11-22 NOTE — Patient Instructions (Signed)

## 2013-12-04 ENCOUNTER — Ambulatory Visit (HOSPITAL_COMMUNITY)
Admission: RE | Admit: 2013-12-04 | Discharge: 2013-12-04 | Disposition: A | Discharge status: Home / Self Care | Payer: Medicaid Other | Source: Ambulatory Visit | Attending: Advanced Practice Midwife | Admitting: Advanced Practice Midwife

## 2013-12-04 DIAGNOSIS — Z3689 Encounter for other specified antenatal screening: Secondary | ICD-10-CM | POA: Diagnosis not present

## 2013-12-04 DIAGNOSIS — E669 Obesity, unspecified: Secondary | ICD-10-CM | POA: Diagnosis present

## 2013-12-04 DIAGNOSIS — O99210 Obesity complicating pregnancy, unspecified trimester: Secondary | ICD-10-CM | POA: Diagnosis not present

## 2013-12-05 ENCOUNTER — Encounter: Payer: Self-pay | Admitting: Obstetrics and Gynecology

## 2013-12-05 ENCOUNTER — Ambulatory Visit (INDEPENDENT_AMBULATORY_CARE_PROVIDER_SITE_OTHER): Payer: Medicaid Other | Admitting: Obstetrics and Gynecology

## 2013-12-05 VITALS — BP 121/69 | HR 81 | Temp 97.9°F | Wt 329.3 lb

## 2013-12-05 DIAGNOSIS — O99210 Obesity complicating pregnancy, unspecified trimester: Secondary | ICD-10-CM

## 2013-12-05 DIAGNOSIS — Z9884 Bariatric surgery status: Secondary | ICD-10-CM

## 2013-12-05 DIAGNOSIS — E559 Vitamin D deficiency, unspecified: Secondary | ICD-10-CM

## 2013-12-05 DIAGNOSIS — D509 Iron deficiency anemia, unspecified: Secondary | ICD-10-CM

## 2013-12-05 DIAGNOSIS — E669 Obesity, unspecified: Secondary | ICD-10-CM

## 2013-12-05 LAB — POCT URINALYSIS DIP (DEVICE)
Bilirubin Urine: NEGATIVE
GLUCOSE, UA: NEGATIVE mg/dL
HGB URINE DIPSTICK: NEGATIVE
KETONES UR: NEGATIVE mg/dL
Nitrite: NEGATIVE
PH: 6 (ref 5.0–8.0)
Protein, ur: 30 mg/dL — AB
Urobilinogen, UA: 1 mg/dL (ref 0.0–1.0)

## 2013-12-05 MED ORDER — FERROUS SULFATE 325 (65 FE) MG PO TABS: 325.0000 mg | ORAL_TABLET | Freq: Two times a day (BID) | ORAL | Status: DC

## 2013-12-05 NOTE — Progress Notes (Signed)
Patient is doing well without complaints. Reports having a cramp a few days ago, not feeling any contractions. 12/04/2013 EFW 2365 gm (68%tile) Cultures next visit FM/PTL precautions reviewed

## 2013-12-05 NOTE — Progress Notes (Signed)
Reports intermittent lower abdominal/pevic pressure, edema in hands and feet.  Patient is out of iron tablets and requests refill.

## 2013-12-12 ENCOUNTER — Encounter: Payer: Medicaid Other | Admitting: Obstetrics and Gynecology

## 2013-12-17 ENCOUNTER — Other Ambulatory Visit: Payer: Self-pay | Admitting: Family Medicine

## 2013-12-19 ENCOUNTER — Encounter: Payer: Self-pay | Admitting: Obstetrics and Gynecology

## 2013-12-19 ENCOUNTER — Ambulatory Visit (INDEPENDENT_AMBULATORY_CARE_PROVIDER_SITE_OTHER): Payer: Medicaid Other | Admitting: Obstetrics and Gynecology

## 2013-12-19 VITALS — BP 135/74 | HR 90 | Wt 330.4 lb

## 2013-12-19 DIAGNOSIS — Z9884 Bariatric surgery status: Secondary | ICD-10-CM

## 2013-12-19 DIAGNOSIS — E669 Obesity, unspecified: Secondary | ICD-10-CM

## 2013-12-19 DIAGNOSIS — O99210 Obesity complicating pregnancy, unspecified trimester: Secondary | ICD-10-CM

## 2013-12-19 DIAGNOSIS — E559 Vitamin D deficiency, unspecified: Secondary | ICD-10-CM

## 2013-12-19 LAB — POCT URINALYSIS DIP (DEVICE)
Bilirubin Urine: NEGATIVE
GLUCOSE, UA: NEGATIVE mg/dL
Hgb urine dipstick: NEGATIVE
KETONES UR: NEGATIVE mg/dL
Nitrite: NEGATIVE
Protein, ur: NEGATIVE mg/dL
Specific Gravity, Urine: 1.02 (ref 1.005–1.030)
Urobilinogen, UA: 1 mg/dL (ref 0.0–1.0)
pH: 6 (ref 5.0–8.0)

## 2013-12-19 LAB — OB RESULTS CONSOLE GC/CHLAMYDIA
CHLAMYDIA, DNA PROBE: NEGATIVE
GC PROBE AMP, GENITAL: NEGATIVE

## 2013-12-19 NOTE — Progress Notes (Signed)
Patient doing well without complaints. FM/PTL precautions reviewed. Cultures collected today. Patient with 3 cm vulva abscess involving the superior aspect of the left labia involving the mons. Patient states it started off as a pimple but progressed to this size and it is tender and painful when walking. Advised to apply heat to area continuously until area softens and is ready to be I&D. Patient admits to only applying heat at bedtime.

## 2013-12-19 NOTE — Progress Notes (Signed)
Reports intermittent pelvic pressure. Reports edema in hands and feet.  C/o of vaginal bump-- states she popped it last week and since then it has been sore and hurts to walk.  GBS and cultures today.

## 2013-12-20 ENCOUNTER — Inpatient Hospital Stay (HOSPITAL_COMMUNITY)
Admission: AD | Admit: 2013-12-20 | Discharge: 2013-12-21 | Disposition: A | Discharge status: Home / Self Care | Payer: Medicaid Other | Source: Ambulatory Visit | Attending: Obstetrics and Gynecology | Admitting: Obstetrics and Gynecology

## 2013-12-20 DIAGNOSIS — O99210 Obesity complicating pregnancy, unspecified trimester: Secondary | ICD-10-CM

## 2013-12-20 DIAGNOSIS — N764 Abscess of vulva: Secondary | ICD-10-CM | POA: Insufficient documentation

## 2013-12-20 DIAGNOSIS — O2390 Unspecified genitourinary tract infection in pregnancy, unspecified trimester: Secondary | ICD-10-CM | POA: Insufficient documentation

## 2013-12-20 DIAGNOSIS — E669 Obesity, unspecified: Secondary | ICD-10-CM | POA: Insufficient documentation

## 2013-12-20 LAB — GC/CHLAMYDIA PROBE AMP
CT PROBE, AMP APTIMA: NEGATIVE
GC PROBE AMP APTIMA: NEGATIVE

## 2013-12-20 LAB — CULTURE, BETA STREP (GROUP B ONLY)

## 2013-12-21 ENCOUNTER — Encounter: Payer: Self-pay | Admitting: Obstetrics and Gynecology

## 2013-12-21 ENCOUNTER — Encounter (HOSPITAL_COMMUNITY): Payer: Self-pay | Admitting: *Deleted

## 2013-12-21 MED ORDER — CEPHALEXIN 500 MG PO CAPS: 500.0000 mg | ORAL_CAPSULE | Freq: Four times a day (QID) | ORAL | Status: DC

## 2013-12-21 MED ORDER — OXYCODONE-ACETAMINOPHEN 5-325 MG PO TABS: 1.0000 | ORAL_TABLET | ORAL | Status: DC | PRN

## 2013-12-21 MED ORDER — METRONIDAZOLE 500 MG PO TABS: 500.0000 mg | ORAL_TABLET | Freq: Four times a day (QID) | ORAL | Status: DC

## 2013-12-21 NOTE — MAU Provider Note (Signed)
History     CSN: [id number]  Arrival date and time: 12/20/13 2343   None     Chief Complaint  Patient presents with  . Cyst   HPI  [name] is a 28 y/o G2P10010 at [redacted]w[redacted]d who presents to the MAU with complaints of a vaginal abscess. Pt first noticed a bump on her external vagina last week. Pt thought it was an ingrown hair and popped it. She noticed the bump got larger and more painful so she went to the clinic to have it looked at. Pt was told to put warm compresses on it and follow-up as needed. Pt reports that it has reduced in size since then but the pain is a 10/10 when walking and a 3/10 while sitting down. Pt noticed that it was leaking a thick white fluid and it had a foul odor and would like it to be drained. Pt denies fever, chills, dizziness, nausea, and vomiting. Pt also denies any vaginal bleeding or discharge and abdominal pain.   Past Medical History  Diagnosis Date  . No pertinent past medical history   . Anemia   . Morbid obesity   . Sleep apnea     hx of  . Blood transfusion without reported diagnosis 2005    Past Surgical History  Procedure Laterality Date  . Breath tek h pylori  02/09/2012    Procedure: BREATH TEK H PYLORI;  Surgeon: Pedro Earls, MD;  Location: [facility];  Service: General;  Laterality: N/A;  . Gastric bypass  08/29/2012  . Hernia repair  2014    Family History  Problem Relation Age of Onset  . Hypertension Father   . Alcohol abuse Neg Hx   . Arthritis Neg Hx   . Cancer Neg Hx   . Early death Neg Hx   . Heart disease Neg Hx   . Hyperlipidemia Neg Hx   . Kidney disease Neg Hx   . Stroke Neg Hx   . Hypertension Mother     History  Substance Use Topics  . Smoking status: Never Smoker   . Smokeless tobacco: Never Used  . Alcohol Use: No    Allergies: No Known Allergies  Facility-administered medications prior to admission  Medication Dose Route Frequency Provider Last Rate Last Dose  . Tdap (BOOSTRIX) injection  0.5 mL  0.5 mL Intramuscular Once Walidah N Muhammad, CNM       Prescriptions prior to admission  Medication Sig Dispense Refill  . acetaminophen (TYLENOL) 500 MG tablet Take 1,000 mg by mouth daily as needed for moderate pain or headache.       . clobetasol ointment (TEMOVATE) 0.05 % Apply to affected area twice a day for one week then once a week for one week.  30 g  0  . ferrous sulfate (FERROUSUL) 325 (65 FE) MG tablet Take 1 tablet (325 mg total) by mouth 2 (two) times daily.  60 tablet  1  . Prenat-FeCbn-FeAspGl-FA-Omega (OB COMPLETE PETITE) 35-5-1-200 MG CAPS TAKE 1 CAPSULE BY MOUTH DAILY.  30 capsule  3  . vitamin D, CHOLECALCIFEROL, 400 UNITS tablet Take 2 tablets (800 Units total) by mouth daily.  60 tablet  1    Review of Systems  Constitutional: Negative for fever and chills.  Respiratory: Negative for shortness of breath.   Cardiovascular: Negative for chest pain.  Gastrointestinal: Negative for nausea, vomiting and abdominal pain.  Genitourinary: Negative for dysuria, urgency and frequency.       Negative for  vaginal bleeding and discharge.   Neurological: Negative for dizziness and headaches.   Physical Exam   Height 5' 9.5" (1.765 m), weight 153.588 kg (338 lb 9.6 oz), last menstrual period 04/12/2013.  Physical Exam  Constitutional: She is oriented to person, place, and time. She appears well-developed and well-nourished. No distress.  Cardiovascular: Normal rate, regular rhythm and normal heart sounds.   Respiratory: Effort normal and breath sounds normal.  GI: Soft. She exhibits no distension. There is no tenderness.  Neurological: She is alert and oriented to person, place, and time.  Psychiatric: She has a normal mood and affect. Her behavior is normal.  Physical exam was limited due to pt BMI.  Physical Examination: Abdomen - soft, nontender, nondistended, no masses or organomegaly Pelvic - VULVA: vulvar tenderness on the pt left with swelling and induration,  with drainage from a 5 mm defect located 8 cm superior and to the left of clitoral hood,, vulvar edema surrounding, , VAGINA: normal appearing vagina with normal color and discharge, no lesions  MAU Course  Procedures I&D note.  Verbal consent obtained after explaining of procedure planned.  Local anesthesia, 20 cc lidocaine 1 % injected in draining sinus, and in surrounding fatty tissues, then #11 blade used for stellate 1.5 cm opening of the drainage site, and hemostat used to probe the drainage tract to depth of 6 cm, with central necrotic debris extracted. Once only bloody drainage obtained, then 5 inch iodoform 1/4inch gauze placed to depth of defect, with 2 cm left out of opening, to be removed in 48 hrs.  Instructed in care, Hot compresses, and soaks, and will send home on Keflex and Flagyl.  MDM Assessment , I&D abscess  Assessment and Plan  Vulvar abscess, left mons pubis, with I&D and placement of iodoform wick.  Lillia Abed 12/21/2013, 12:43 AM

## 2013-12-21 NOTE — MAU Note (Signed)
Pt states she had a hair bump that turned into a a cyst. Pt states she was told to apply warm soaks to area. Pt  States it has been draining and has gone down a lot. Pt was told that it could be drained here.

## 2013-12-21 NOTE — Discharge Instructions (Signed)
.  Vulvar Abscess   The vulva is made up of the large and small flaps of skin around the vagina opening. A vulvar abscess is an infected sac of yellowish white fluid (pus) in the skin flaps. Your doctor may make a small cut in the skin to drain the vulvar abscess.   HOME CARE   Only take medicine as told by your doctor.   Soak or take a warm water bath (sitz bath) 3 to 4 times a day for 15 to 20 minutes.   After you pee (urinate), always wipe from front to back.   Clean the vulvar abscess with soap and warm water. Do this after going to the bathroom.   Wear loose-fitting clothing.   Do not have sex until the vulvar abscess is gone or as told by your doctor.  GET HELP RIGHT AWAY IF:    You have a temperature by mouth above 102 F (38.9 C).   The vulva area becomes more painful, puffy (swollen), or red.   You have fluid coming from the vulva area that is red or tan.   You have pain when you pee or have a hard time peeing.  MAKE SURE YOU:   Understand these instructions.   Will watch your condition.   Will get help if you are not doing well or get worse.  Document Released: 09/18/2008 Document Revised: 09/14/2011 Document Reviewed: 09/18/2008  ExitCare Patient Information 2015 [employer]. This information is not intended to replace advice given to you by your health care provider. Make sure you discuss any questions you have with your health care provider.

## 2013-12-21 NOTE — Progress Notes (Signed)
Pt states pain is about 10 when she is walking and sitting its ok 2-3

## 2013-12-22 ENCOUNTER — Inpatient Hospital Stay (HOSPITAL_COMMUNITY)
Admission: AD | Admit: 2013-12-22 | Discharge: 2013-12-23 | Disposition: A | Discharge status: Home / Self Care | Payer: Medicaid Other | Source: Ambulatory Visit | Attending: Obstetrics & Gynecology | Admitting: Obstetrics & Gynecology

## 2013-12-22 DIAGNOSIS — N760 Acute vaginitis: Secondary | ICD-10-CM | POA: Insufficient documentation

## 2013-12-22 DIAGNOSIS — Z9884 Bariatric surgery status: Secondary | ICD-10-CM

## 2013-12-22 DIAGNOSIS — G4730 Sleep apnea, unspecified: Secondary | ICD-10-CM | POA: Insufficient documentation

## 2013-12-22 DIAGNOSIS — Z09 Encounter for follow-up examination after completed treatment for conditions other than malignant neoplasm: Secondary | ICD-10-CM

## 2013-12-22 DIAGNOSIS — O99210 Obesity complicating pregnancy, unspecified trimester: Secondary | ICD-10-CM

## 2013-12-22 DIAGNOSIS — E559 Vitamin D deficiency, unspecified: Secondary | ICD-10-CM

## 2013-12-23 ENCOUNTER — Encounter (HOSPITAL_COMMUNITY): Payer: Self-pay | Admitting: *Deleted

## 2013-12-23 NOTE — MAU Provider Note (Signed)
[facility] Initiated Contact with Patient 12/23/13 0044      Chief Complaint:  Recheck abscess s/p I&D 12/20/13  [name] is  28 y.o. G2P0010 at [redacted]w[redacted]d presents complaining of Follow-up recheck abscess as above.  She states none contractions are associated with none vaginal bleeding, intact membranes, along with active fetal movement. Presented to MAU with "cust" on anterior vulva and underwent incision and drainage by Dr Glo Herring, see his note for full details. Pt reports no drainage from the wound, can feel "tough tissue" under the skin but no drainage or skin changes, no fever/chills. She is taking Keflex and Flagyl as directed, pain is controlled.   Obstetrical/Gynecological History: OB History   Grav Para Term Preterm Abortions TAB SAB Ect Mult Living   2    1  1    0     Past Medical History: Past Medical History  Diagnosis Date  . No pertinent past medical history   . Anemia   . Morbid obesity   . Sleep apnea     hx of  . Blood transfusion without reported diagnosis 2005    Past Surgical History: Past Surgical History  Procedure Laterality Date  . Breath tek h pylori  02/09/2012    Procedure: BREATH TEK H PYLORI;  Surgeon: Pedro Earls, MD;  Location: [facility];  Service: General;  Laterality: N/A;  . Gastric bypass  08/29/2012  . Hernia repair  2014    Family History: Family History  Problem Relation Age of Onset  . Hypertension Father   . Alcohol abuse Neg Hx   . Arthritis Neg Hx   . Cancer Neg Hx   . Early death Neg Hx   . Heart disease Neg Hx   . Hyperlipidemia Neg Hx   . Kidney disease Neg Hx   . Stroke Neg Hx   . Hypertension Mother     Social History: History  Substance Use Topics  . Smoking status: Never Smoker   . Smokeless tobacco: Never Used  . Alcohol Use: No    Allergies: No Known Allergies  Meds:  Facility-administered medications prior to admission  Medication Dose Route Frequency Monia Timmers Last Rate Last Dose  .  Tdap (BOOSTRIX) injection 0.5 mL  0.5 mL Intramuscular Once Walidah N Muhammad, CNM       Prescriptions prior to admission  Medication Sig Dispense Refill  . acetaminophen (TYLENOL) 500 MG tablet Take 1,000 mg by mouth daily as needed for moderate pain or headache.       . cephALEXin (KEFLEX) 500 MG capsule Take 1 capsule (500 mg total) by mouth 4 (four) times daily.  40 capsule  0  . ferrous sulfate (FERROUSUL) 325 (65 FE) MG tablet Take 1 tablet (325 mg total) by mouth 2 (two) times daily.  60 tablet  1  . metroNIDAZOLE (FLAGYL) 500 MG tablet Take 1 tablet (500 mg total) by mouth 4 (four) times daily.  40 tablet  0  . oxyCODONE-acetaminophen (PERCOCET/ROXICET) 5-325 MG per tablet Take 1 tablet by mouth every 4 (four) hours as needed.  20 tablet  0  . Prenat-FeCbn-FeAspGl-FA-Omega (OB COMPLETE PETITE) 35-5-1-200 MG CAPS TAKE 1 CAPSULE BY MOUTH DAILY.  30 capsule  3  . vitamin D, CHOLECALCIFEROL, 400 UNITS tablet Take 2 tablets (800 Units total) by mouth daily.  60 tablet  1    Review of Systems -   Review of Systems  Constitutional: Negative for fever, chills Eyes: Negative for blurred vision, double vision,  Respiratory: Negative for cough, shortness of breath,  Cardiovascular: Negative for chest pain, palpitations, Genitourinary: Negative for dysuria, urgency, frequency, hematuria  Skin: As noted in HPI, tender area on anterior vulva s/p I&D, nondrainaing, no bleeding. Packing came out on its own. Pain worse with walking, controlled.      Physical Exam  Last menstrual period 04/12/2013. GENERAL: Well-developed, well-nourished female in no acute distress.  ABDOMEN: Soft, nontender, nondistended, gravid.  EXTREMITIES: Nontender, no edema FHT: baseline 145, moderate variability, accleerations present, no decelerations TOCO: no CTX External genitalia: tender, indurated area at anterior L vulva/mons pubis. 1cm incision, healing, was able to express thin purulent fluid from the wound.     Labs: No results found for this or any previous visit (from the past 24 hour(s)). Imaging Studies:  US Ob Follow Up  12/04/2013   OBSTETRICAL ULTRASOUND: This exam was performed within a  Ultrasound Department. The OB US report was generated in the [facility] system, and faxed to the ordering physician.   This report is also available in [employer] and in the [employer]. See [facility] Obstetric US report.  Procedures:  Verbal consent obtained to explore wound and repack. Wound explored to 1.5 cm deep, attempted to repack, somewhat successful however given the small incision and that it had already started to heal, difficult to place packing to 1.5cm deep. Approx 2 inches iodoform packing placed with 2 inches wick left externally, which was taped to skin. Patient tolerated procedure.   Assessment: [name] is  28 y.o. G2P0010 at [redacted]w[redacted]d presents with recheck abscess.  Plan: 2 days S/p incision and drainage skin abscess, improving. Pt will come back to MAU for recheck wound in 2 days (Sunday) since I did repack it due to persistent purulent drainage. Did not collect cultures since patient was already on abx. Continue Keflex and Flagyl until all abx are finished. Pt knows to come back for worsening pain/drainage, fever/chills. Labor precautions also reviewed. Discussed with Serita Grammes CNM.   Emeterio Reeve 6/20/20151:11 AM  I participated in the care of this patient and I agree with the above. Serita Grammes CNM 3:16 AM 12/23/2013

## 2013-12-23 NOTE — Discharge Instructions (Signed)
Incision and Drainage Care After Refer to this sheet in the next few weeks. These instructions provide you with information on caring for yourself after your procedure. Your caregiver may also give you more specific instructions. Your treatment has been planned according to current medical practices, but problems sometimes occur. Call your caregiver if you have any problems or questions after your procedure. HOME CARE INSTRUCTIONS   If antibiotic medicine is given, take it as directed. Finish it even if you start to feel better.  Only take over-the-counter or prescription medicines for pain, discomfort, or fever as directed by your caregiver.  Keep all follow-up appointments as directed by your caregiver.  Change any bandages (dressings) as directed by your caregiver. Replace old dressings with clean dressings.  Wash your hands before and after caring for your wound. You will receive specific instructions for cleansing and caring for your wound.  SEEK MEDICAL CARE IF:   You have increased pain, swelling, or redness around the wound.  You have increased drainage, smell, or bleeding from the wound.  You have muscle aches, chills, or you feel generally sick.  You have a fever. MAKE SURE YOU:   Understand these instructions.  Will watch your condition.  Will get help right away if you are not doing well or get worse. Document Released: 09/14/2011 Document Reviewed: 09/14/2011 Exit[employer] Patient Information 2015 [employer]. This information is not intended to replace advice given to you by your health care provider. Make sure you discuss any questions you have with your health care provider.  

## 2013-12-23 NOTE — MAU Note (Signed)
Pt states she was here 2 days ago due to an abcess in her vaginal area. States it was opened up and packed and we told her to return today for a recheck

## 2013-12-25 ENCOUNTER — Encounter (HOSPITAL_COMMUNITY): Payer: Self-pay | Admitting: General Practice

## 2013-12-25 ENCOUNTER — Inpatient Hospital Stay (HOSPITAL_COMMUNITY)
Admission: AD | Admit: 2013-12-25 | Discharge: 2013-12-25 | Disposition: A | Discharge status: Home / Self Care | Payer: Medicaid Other | Source: Ambulatory Visit | Attending: Obstetrics & Gynecology | Admitting: Obstetrics & Gynecology

## 2013-12-25 DIAGNOSIS — O26899 Other specified pregnancy related conditions, unspecified trimester: Secondary | ICD-10-CM

## 2013-12-25 DIAGNOSIS — N760 Acute vaginitis: Secondary | ICD-10-CM | POA: Insufficient documentation

## 2013-12-25 DIAGNOSIS — R109 Unspecified abdominal pain: Secondary | ICD-10-CM | POA: Insufficient documentation

## 2013-12-25 DIAGNOSIS — O2390 Unspecified genitourinary tract infection in pregnancy, unspecified trimester: Secondary | ICD-10-CM | POA: Insufficient documentation

## 2013-12-25 DIAGNOSIS — Z09 Encounter for follow-up examination after completed treatment for conditions other than malignant neoplasm: Secondary | ICD-10-CM | POA: Insufficient documentation

## 2013-12-25 DIAGNOSIS — G5600 Carpal tunnel syndrome, unspecified upper limb: Secondary | ICD-10-CM | POA: Insufficient documentation

## 2013-12-25 NOTE — MAU Provider Note (Signed)
First Provider Initiated Contact with Patient 12/25/13 2051      Chief Complaint:  Recheck abscess s/p I&D, pelvis pressure, hand numbenss on R  [name] is  28 y.o. G2P0010 at [redacted]w[redacted]d presents complaining of Recheck abscess s/p I&D, pelvis pressure, hand numbenss on R.  She states none contractions are associated with none vaginal bleeding, intact membranes, along with active fetal movement.   Seen 3 days ago for recheck abscess s/p I&D, packing replaced by myself at that visit. Today, packing has come out, no additional drainage, pain improving.   C/o pressure in lower abdomen, mild cramping, no regular contractions. Baby moving well. She sits most of the day at work and this seems to make pain worse.   Hand numbness in R, tingling in first 3 digits. No difficulty moving digits but grip strength slightly decreased.   Obstetrical/Gynecological History: OB History   Grav Para Term Preterm Abortions TAB SAB Ect Mult Living   2    1  1    0     Past Medical History: Past Medical History  Diagnosis Date  . No pertinent past medical history   . Anemia   . Morbid obesity   . Sleep apnea     hx of  . Blood transfusion without reported diagnosis 2005    Past Surgical History: Past Surgical History  Procedure Laterality Date  . Breath tek h pylori  02/09/2012    Procedure: BREATH TEK H PYLORI;  Surgeon: Pedro Earls, MD;  Location: [facility];  Service: General;  Laterality: N/A;  . Gastric bypass  08/29/2012  . Hernia repair  2014    Family History: Family History  Problem Relation Age of Onset  . Hypertension Father   . Alcohol abuse Neg Hx   . Arthritis Neg Hx   . Cancer Neg Hx   . Early death Neg Hx   . Heart disease Neg Hx   . Hyperlipidemia Neg Hx   . Kidney disease Neg Hx   . Stroke Neg Hx   . Hypertension Mother     Social History: History  Substance Use Topics  . Smoking status: Never Smoker   . Smokeless tobacco: Never Used  . Alcohol Use:  No    Allergies: No Known Allergies  Meds:  Prescriptions prior to admission  Medication Sig Dispense Refill  . acetaminophen (TYLENOL) 500 MG tablet Take 1,000 mg by mouth daily as needed for moderate pain or headache.       . cephALEXin (KEFLEX) 500 MG capsule Take 1 capsule (500 mg total) by mouth 4 (four) times daily.  40 capsule  0  . ferrous sulfate (FERROUSUL) 325 (65 FE) MG tablet Take 1 tablet (325 mg total) by mouth 2 (two) times daily.  60 tablet  1  . metroNIDAZOLE (FLAGYL) 500 MG tablet Take 1 tablet (500 mg total) by mouth 4 (four) times daily.  40 tablet  0  . oxyCODONE-acetaminophen (PERCOCET/ROXICET) 5-325 MG per tablet Take 1 tablet by mouth every 4 (four) hours as needed.  20 tablet  0  . Prenat-FeCbn-FeAspGl-FA-Omega (OB COMPLETE PETITE) 35-5-1-200 MG CAPS TAKE 1 CAPSULE BY MOUTH DAILY.  30 capsule  3  . vitamin D, CHOLECALCIFEROL, 400 UNITS tablet Take 2 tablets (800 Units total) by mouth daily.  60 tablet  1    Review of Systems -   CONST: no fever/chills, some nausea, no vomiting GI: abd pain/cramping as per HPI, no diarrhea/constipation/blood in stool GU: No dysuria/hematuria CV:  no chest pain/pressure   Physical Exam  Blood pressure 132/68, pulse 88, resp. rate 18, last menstrual period 04/12/2013. GENERAL: Well-developed, well-nourished female in no acute distress.  ABDOMEN: Soft, nontender, nondistended, gravid.  EXTREMITIES: Nontender, trace edema, 2+ distal pulses. CERVICAL EXAM: Dilatation 0cm   Effacement 0%   Station ballotable Presentation: unable to determine by cervical check FHT:  Baseline rate 145 bpm   Variability moderate  Accelerations present   Decelerations none Contractions: none   Labs: No results found for this or any previous visit (from the past 24 hour(s)). Imaging Studies:  US Ob Follow Up  12/04/2013   OBSTETRICAL ULTRASOUND: This exam was performed within a [facility]. The OB US report was generated in the  AS system, and faxed to the ordering physician.   This report is also available in [employer] and in the [employer]. See [facility] Obstetric US report.   Assessment: [name] is  28 y.o. G2P0010 at [redacted]w[redacted]d presents with   1. recheck abscess s/p I&D and packing - healing appropriately, on Keflex and Flagyl, no culture was performed.  2. Abdominal pain/crmaping, normal pregnancy. FWB Category I  3. Carpal tunnel syndrome R hand  Plan: 1. Continue abx until these are completely finished, have your doctor check wound at next Prenatal visits. Return for eval if pain worse, notice drainage or skin changes in the area, fever/chills.   2. Abdominal pain of pregnancy, can take OTC Tylenol as directed. Labor precautions reviewed. Return for eval if vaginal discharge/bleeding, contractions, worsening pain, decreased/absent fetal movement  3. Recommend brace at night, likely carpal tunnle will resolve after delivery, stretching exercises reviewed, pt info printed.   Emeterio Reeve 6/22/20159:20 PM  I was present for the exam and agree with above.  [place], [place] 12/25/2013 9:50 PM

## 2013-12-25 NOTE — Discharge Instructions (Signed)
Carpal Tunnel Syndrome The carpal tunnel is a narrow area located on the palm side of your wrist. The tunnel is formed by the wrist bones and ligaments. Nerves, blood vessels, and tendons pass through the carpal tunnel. Repeated wrist motion or certain diseases may cause swelling within the tunnel. This swelling pinches the main nerve in the wrist (median nerve) and causes the painful hand and arm condition called carpal tunnel syndrome. CAUSES   Repeated wrist motions.  Wrist injuries.  Certain diseases like arthritis, diabetes, alcoholism, hyperthyroidism, and kidney failure.  Obesity.  Pregnancy. SYMPTOMS   A "pins and needles" feeling in your fingers or hand.  Tingling or numbness in your fingers or hand.  An aching feeling in your entire arm.  Wrist pain that goes up your arm to your shoulder.  Pain that goes down into your palm or fingers.  A weak feeling in your hands. DIAGNOSIS  Your caregiver will take your history and perform a physical exam. An electromyography test may be needed. This test measures electrical signals sent out by the muscles. The electrical signals are usually slowed by carpal tunnel syndrome. You may also need X-rays. TREATMENT  Carpal tunnel syndrome may clear up by itself. Your caregiver may recommend a wrist splint or medicine such as a nonsteroidal anti-inflammatory medicine. Cortisone injections may help. Sometimes, surgery may be needed to free the pinched nerve.  HOME CARE INSTRUCTIONS   Take all medicine as directed by your caregiver. Only take over-the-counter or prescription medicines for pain, discomfort, or fever as directed by your caregiver.  If you were given a splint to keep your wrist from bending, wear it as directed. It is important to wear the splint at night. Wear the splint for as long as you have pain or numbness in your hand, arm, or wrist. This may take 1 to 2 months.  Rest your wrist from any activity that may be causing your  pain. If your symptoms are work-related, you may need to talk to your employer about changing to a job that does not require using your wrist.  Put ice on your wrist after long periods of wrist activity.  Put ice in a plastic bag.  Place a towel between your skin and the bag.  Leave the ice on for 15-20 minutes, 03-04 times a day.  Keep all follow-up visits as directed by your caregiver. This includes any orthopedic referrals, physical therapy, and rehabilitation. Any delay in getting necessary care could result in a delay or failure of your condition to heal. SEEK IMMEDIATE MEDICAL CARE IF:   You have new, unexplained symptoms.  Your symptoms get worse and are not helped or controlled with medicines. MAKE SURE YOU:   Understand these instructions.  Will watch your condition.  Will get help right away if you are not doing well or get worse. Document Released: 06/19/2000 Document Revised: 09/14/2011 Document Reviewed: 05/08/2011 [facility] Patient Information 2015 [place], [place]. This information is not intended to replace advice given to you by your health care provider. Make sure you discuss any questions you have with your health care provider.     Abdominal Pain During Pregnancy Abdominal pain is common in pregnancy. Most of the time, it does not cause harm. There are many causes of abdominal pain. Some causes are more serious than others. Some of the causes of abdominal pain in pregnancy are easily diagnosed. Occasionally, the diagnosis takes time to understand. Other times, the cause is not determined. Abdominal pain can be  a sign that something is very wrong with the pregnancy, or the pain may have nothing to do with the pregnancy at all. For this reason, always tell your health care provider if you have any abdominal discomfort. HOME CARE INSTRUCTIONS  Monitor your abdominal pain for any changes. The following actions may help to alleviate any discomfort you are  experiencing:  Do not have sexual intercourse or put anything in your vagina until your symptoms go away completely.  Get plenty of rest until your pain improves.  Drink clear fluids if you feel nauseous. Avoid solid food as long as you are uncomfortable or nauseous.  Only take over-the-counter or prescription medicine as directed by your health care provider.  Keep all follow-up appointments with your health care provider. SEEK IMMEDIATE MEDICAL CARE IF:  You are bleeding, leaking fluid, or passing tissue from the vagina.  You have increasing pain or cramping.  You have persistent vomiting.  You have painful or bloody urination.  You have a fever.  You notice a decrease in your baby's movements.  You have extreme weakness or feel faint.  You have shortness of breath, with or without abdominal pain.  You develop a severe headache with abdominal pain.  You have abnormal vaginal discharge with abdominal pain.  You have persistent diarrhea.  You have abdominal pain that continues even after rest, or gets worse. MAKE SURE YOU:   Understand these instructions.  Will watch your condition.  Will get help right away if you are not doing well or get worse. Document Released: 06/22/2005 Document Revised: 04/12/2013 Document Reviewed: 01/19/2013 [facility] Patient Information 2015 [place], [place]. This information is not intended to replace advice given to you by your health care provider. Make sure you discuss any questions you have with your health care provider.    Abscess An abscess is an infected area that contains a collection of pus and debris.It can occur in almost any part of the body. An abscess is also known as a furuncle or boil. CAUSES  An abscess occurs when tissue gets infected. This can occur from blockage of oil or sweat glands, infection of hair follicles, or a minor injury to the skin. As the body tries to fight the infection, pus collects in the area and  creates pressure under the skin. This pressure causes pain. People with weakened immune systems have difficulty fighting infections and get certain abscesses more often.  SYMPTOMS Usually an abscess develops on the skin and becomes a painful mass that is red, warm, and tender. If the abscess forms under the skin, you may feel a moveable soft area under the skin. Some abscesses break open (rupture) on their own, but most will continue to get worse without care. The infection can spread deeper into the body and eventually into the bloodstream, causing you to feel ill.  DIAGNOSIS  Your caregiver will take your medical history and perform a physical exam. A sample of fluid may also be taken from the abscess to determine what is causing your infection. TREATMENT  Your caregiver may prescribe antibiotic medicines to fight the infection. However, taking antibiotics alone usually does not cure an abscess. Your caregiver may need to make a small cut (incision) in the abscess to drain the pus. In some cases, gauze is packed into the abscess to reduce pain and to continue draining the area. HOME CARE INSTRUCTIONS   Only take over-the-counter or prescription medicines for pain, discomfort, or fever as directed by your caregiver.  If  you were prescribed antibiotics, take them as directed. Finish them even if you start to feel better.  If gauze is used, follow your caregiver's directions for changing the gauze.  To avoid spreading the infection:  Keep your draining abscess covered with a bandage.  Wash your hands well.  Do not share personal care items, towels, or whirlpools with others.  Avoid skin contact with others.  Keep your skin and clothes clean around the abscess.  Keep all follow-up appointments as directed by your caregiver. SEEK MEDICAL CARE IF:   You have increased pain, swelling, redness, fluid drainage, or bleeding.  You have muscle aches, chills, or a general ill feeling.  You  have a fever. MAKE SURE YOU:   Understand these instructions.  Will watch your condition.  Will get help right away if you are not doing well or get worse. Document Released: 04/01/2005 Document Revised: 12/22/2011 Document Reviewed: 09/04/2011 [facility] Patient Information 2015 [place], [place]. This information is not intended to replace advice given to you by your health care provider. Make sure you discuss any questions you have with your health care provider.

## 2013-12-26 ENCOUNTER — Encounter (HOSPITAL_COMMUNITY): Payer: Self-pay

## 2013-12-28 ENCOUNTER — Ambulatory Visit (INDEPENDENT_AMBULATORY_CARE_PROVIDER_SITE_OTHER): Payer: Medicaid Other | Admitting: Obstetrics & Gynecology

## 2013-12-28 VITALS — BP 130/80 | HR 92 | Temp 97.5°F | Wt 331.7 lb

## 2013-12-28 DIAGNOSIS — O09899 Supervision of other high risk pregnancies, unspecified trimester: Secondary | ICD-10-CM

## 2013-12-28 DIAGNOSIS — O99210 Obesity complicating pregnancy, unspecified trimester: Secondary | ICD-10-CM

## 2013-12-28 DIAGNOSIS — N898 Other specified noninflammatory disorders of vagina: Secondary | ICD-10-CM

## 2013-12-28 DIAGNOSIS — B373 Candidiasis of vulva and vagina: Secondary | ICD-10-CM

## 2013-12-28 DIAGNOSIS — B3731 Acute candidiasis of vulva and vagina: Secondary | ICD-10-CM

## 2013-12-28 DIAGNOSIS — Z9884 Bariatric surgery status: Secondary | ICD-10-CM

## 2013-12-28 DIAGNOSIS — Z22330 Carrier of Group B streptococcus: Secondary | ICD-10-CM

## 2013-12-28 DIAGNOSIS — O99820 Streptococcus B carrier state complicating pregnancy: Secondary | ICD-10-CM

## 2013-12-28 DIAGNOSIS — E669 Obesity, unspecified: Secondary | ICD-10-CM

## 2013-12-28 DIAGNOSIS — O9989 Other specified diseases and conditions complicating pregnancy, childbirth and the puerperium: Secondary | ICD-10-CM

## 2013-12-28 DIAGNOSIS — O26893 Other specified pregnancy related conditions, third trimester: Secondary | ICD-10-CM

## 2013-12-28 LAB — POCT URINALYSIS DIP (DEVICE)
GLUCOSE, UA: NEGATIVE mg/dL
Ketones, ur: NEGATIVE mg/dL
Nitrite: NEGATIVE
Protein, ur: 30 mg/dL — AB
UROBILINOGEN UA: 0.2 mg/dL (ref 0.0–1.0)
pH: 5.5 (ref 5.0–8.0)

## 2013-12-28 NOTE — Progress Notes (Signed)
Patient reports a lot of pelvic pressure; also still reporting swelling and numbness in her fingertips on right hand; currently experiencing vaginal irritation

## 2013-12-28 NOTE — Progress Notes (Signed)
Patient had I&D of mons abscess on 12/25/13; feeling better.  No active drainage or erythema.  Fingertips symptoms are likely carpal tunnel symptoms; common in pregnancy. Will continue to monitor. White vaginal discharge seen on exam; wet prep sample obtained.  Will follow up results and manage accordingly. Patient asked to be written out of work because she works in [place]; she was told there was no medical indication for now.  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 Patient was informed of GBS positive status; will need treatment in labor. No other complaints or concerns.  Fetal movement and labor precautions reviewed.

## 2013-12-28 NOTE — Patient Instructions (Signed)
Group B Streptococcus Infection During Pregnancy Group B streptococcus (GBS) is a type of bacteria often found in healthy women. GBS is not the same as the bacteria that causes strep throat. You may have GBS in your vagina, rectum, or bladder. GBS does not spread through sexual contact, but it can be passed to a baby during childbirth. This can be dangerous for your baby. It is not dangerous to you and usually does not cause any symptoms. Your health care provider may test you for GBS when your pregnancy is between 35 and 37 weeks. GBS is dangerous only during birth, so there is no need to test for it earlier. It is possible to have GBS during pregnancy and never pass it to your baby. If your test results are positive for GBS, your health care provider may recommend giving you antibiotic medicine during delivery to make sure your baby stays healthy. RISK FACTORS You are more likely to pass GBS to your baby if:   Your water breaks (ruptured membrane) or you go into labor before 37 weeks.  Your water breaks 18 hours before you deliver.  You passed GBS during a previous pregnancy.  You have a urinary tract infection caused by GBS any time during pregnancy.  You have a fever during labor. SYMPTOMS Most women who have GBS do not have any symptoms. If you have a urinary tract infection caused by GBS, you might have frequent or painful urination and fever. Babies who get GBS usually show symptoms within 7 days of birth. Symptoms may include:   Breathing problems.  Heart and blood pressure problems.  Digestive and kidney problems. DIAGNOSIS Routine screening for GBS is recommended for all pregnant women. A health care provider takes a sample of the fluid in your vagina and rectum with a swab. It is then sent to a lab to be checked for GBS. A sample of your urine may also be checked for the bacteria.  TREATMENT If you test positive for GBS, you may need treatment with an antibiotic medicine during  labor. As soon as you go into labor, or as soon as your membranes rupture, you will get the antibiotic medicine through an IV access. You will continue to get the medicine until after you give birth. You do not need antibiotic medicine if you are having a cesarean delivery.If your baby shows signs or symptoms of GBS after birth, your baby can also be treated with an antibiotic medicine. HOME CARE INSTRUCTIONS   Take all antibiotic medicine as prescribed by your health care provider. Only take medicine as directed.   Continue with prenatal visits and care.   Keep all follow-up appointments.  SEEK MEDICAL CARE IF:   You have pain when you urinate.   You have to urinate frequently.   You have a fever.  SEEK IMMEDIATE MEDICAL CARE IF:   Your membranes rupture.  You go into labor. Document Released: 09/29/2007 Document Revised: 06/27/2013 Document Reviewed: 04/14/2013 [employer] Patient Information 2015 [employer]. This information is not intended to replace advice given to you by your health care provider. Make sure you discuss any questions you have with your health care provider.  

## 2013-12-29 LAB — WET PREP, GENITAL
CLUE CELLS WET PREP: NONE SEEN
Trich, Wet Prep: NONE SEEN

## 2013-12-30 MED ORDER — FLUCONAZOLE 150 MG PO TABS: 150.0000 mg | ORAL_TABLET | ORAL | Status: DC

## 2013-12-30 NOTE — Addendum Note (Signed)
Addended by: Verita Schneiders A on: 12/30/2013 10:53 AM   Modules accepted: Orders

## 2013-12-30 NOTE — Progress Notes (Signed)
Lab Result Phone Call  Notes Recorded by Osborne Oman, MD on 12/30/2013 at 11:32 AM Patient called back and I relayed the information about the vaginal yeast infection and the prescribed treatment regimen to her. She will follow up in clinic as needed.  Notes Recorded by Osborne Oman, MD on 12/30/2013 at 10:54 AM Diflucan prescribed for yeast infection. Attempted to call patient, went to generic voicemail message. Will forward to clinic pool to call to inform patient of results and advise her to pick up prescription.    Verita Schneiders, MD, [place] Attending [place], [facility]

## 2014-01-03 NOTE — MAU Provider Note (Signed)
Attestation of Attending Supervision of Advanced Practitioner (CNM/NP): Evaluation and management procedures were performed by the Advanced Practitioner under my supervision and collaboration. I have reviewed the Advanced Practitioner's note and chart, and I agree with the management and plan.  LEGGETT,KELLY H. 12:00 PM

## 2014-01-08 ENCOUNTER — Encounter: Payer: Self-pay | Admitting: Obstetrics & Gynecology

## 2014-01-08 ENCOUNTER — Ambulatory Visit (INDEPENDENT_AMBULATORY_CARE_PROVIDER_SITE_OTHER): Payer: Medicaid Other | Admitting: Obstetrics & Gynecology

## 2014-01-08 VITALS — BP 129/76 | HR 86 | Temp 98.4°F | Wt 327.6 lb

## 2014-01-08 DIAGNOSIS — O3660X0 Maternal care for excessive fetal growth, unspecified trimester, not applicable or unspecified: Secondary | ICD-10-CM

## 2014-01-08 DIAGNOSIS — O368190 Decreased fetal movements, unspecified trimester, not applicable or unspecified: Secondary | ICD-10-CM

## 2014-01-08 DIAGNOSIS — O368131 Decreased fetal movements, third trimester, fetus 1: Secondary | ICD-10-CM

## 2014-01-08 DIAGNOSIS — Z3480 Encounter for supervision of other normal pregnancy, unspecified trimester: Secondary | ICD-10-CM

## 2014-01-08 DIAGNOSIS — Z22330 Carrier of Group B streptococcus: Secondary | ICD-10-CM

## 2014-01-08 DIAGNOSIS — O99820 Streptococcus B carrier state complicating pregnancy: Secondary | ICD-10-CM

## 2014-01-08 DIAGNOSIS — O3663X1 Maternal care for excessive fetal growth, third trimester, fetus 1: Secondary | ICD-10-CM

## 2014-01-08 LAB — POCT URINALYSIS DIP (DEVICE)
Bilirubin Urine: NEGATIVE
Glucose, UA: NEGATIVE mg/dL
HGB URINE DIPSTICK: NEGATIVE
Ketones, ur: NEGATIVE mg/dL
NITRITE: NEGATIVE
PH: 6 (ref 5.0–8.0)
Protein, ur: NEGATIVE mg/dL
Specific Gravity, Urine: 1.02 (ref 1.005–1.030)
Urobilinogen, UA: 0.2 mg/dL (ref 0.0–1.0)

## 2014-01-08 NOTE — Progress Notes (Signed)
Edema in hands and feet.  Reports noticing baby moving less frequently the past couple of days.  Reports feeling belly tighten often but denies pain.

## 2014-01-08 NOTE — Patient Instructions (Signed)
Fetal Movement Counts Patient Name: __________________________________________________ Patient Due Date: ____________________ Performing a fetal movement count is highly recommended in high-risk pregnancies, but it is good for every pregnant woman to do. Your caregiver may ask you to start counting fetal movements at 28 weeks of the pregnancy. Fetal movements often increase:  After eating a full meal.  After physical activity.  After eating or drinking something sweet or cold.  At rest. Pay attention to when you feel the baby is most active. This will help you notice a pattern of your baby's sleep and wake cycles and what factors contribute to an increase in fetal movement. It is important to perform a fetal movement count at the same time each day when your baby is normally most active.  HOW TO COUNT FETAL MOVEMENTS 1. Find a quiet and comfortable area to sit or lie down on your left side. Lying on your left side provides the best blood and oxygen circulation to your baby. 2. Write down the day and time on a sheet of paper or in a journal. 3. Start counting kicks, flutters, swishes, rolls, or jabs in a 2 hour period. You should feel at least 10 movements within 2 hours. 4. If you do not feel 10 movements in 2 hours, wait 2-3 hours and count again. Look for a change in the pattern or not enough counts in 2 hours. SEEK MEDICAL CARE IF:  You feel less than 10 counts in 2 hours, tried twice.  There is no movement in over an hour.  The pattern is changing or taking longer each day to reach 10 counts in 2 hours.  You feel the baby is not moving as he or she usually does. Date: ____________ Movements: ____________ Start time: ____________ Elizebeth Koller time: ____________  Date: ____________ Movements: ____________ Start time: ____________ Elizebeth Koller time: ____________ Date: ____________ Movements: ____________ Start time: ____________ Elizebeth Koller time: ____________ Date: ____________ Movements: ____________  Start time: ____________ Elizebeth Koller time: ____________ Date: ____________ Movements: ____________ Start time: ____________ Elizebeth Koller time: ____________ Date: ____________ Movements: ____________ Start time: ____________ Elizebeth Koller time: ____________ Date: ____________ Movements: ____________ Start time: ____________ Elizebeth Koller time: ____________ Date: ____________ Movements: ____________ Start time: ____________ Elizebeth Koller time: ____________  Date: ____________ Movements: ____________ Start time: ____________ Elizebeth Koller time: ____________ Date: ____________ Movements: ____________ Start time: ____________ Elizebeth Koller time: ____________ Date: ____________ Movements: ____________ Start time: ____________ Elizebeth Koller time: ____________ Date: ____________ Movements: ____________ Start time: ____________ Elizebeth Koller time: ____________ Date: ____________ Movements: ____________ Start time: ____________ Elizebeth Koller time: ____________ Date: ____________ Movements: ____________ Start time: ____________ Elizebeth Koller time: ____________ Date: ____________ Movements: ____________ Start time: ____________ Elizebeth Koller time: ____________  Date: ____________ Movements: ____________ Start time: ____________ Elizebeth Koller time: ____________ Date: ____________ Movements: ____________ Start time: ____________ Elizebeth Koller time: ____________ Date: ____________ Movements: ____________ Start time: ____________ Elizebeth Koller time: ____________ Date: ____________ Movements: ____________ Start time: ____________ Elizebeth Koller time: ____________ Date: ____________ Movements: ____________ Start time: ____________ Elizebeth Koller time: ____________ Date: ____________ Movements: ____________ Start time: ____________ Elizebeth Koller time: ____________ Date: ____________ Movements: ____________ Start time: ____________ Elizebeth Koller time: ____________  Date: ____________ Movements: ____________ Start time: ____________ Elizebeth Koller time: ____________ Date: ____________ Movements: ____________ Start time: ____________ Elizebeth Koller time:  ____________ Date: ____________ Movements: ____________ Start time: ____________ Elizebeth Koller time: ____________ Date: ____________ Movements: ____________ Start time: ____________ Elizebeth Koller time: ____________ Date: ____________ Movements: ____________ Start time: ____________ Elizebeth Koller time: ____________ Date: ____________ Movements: ____________ Start time: ____________ Elizebeth Koller time: ____________ Date: ____________ Movements: ____________ Start time: ____________ Elizebeth Koller time: ____________  Date: ____________ Movements: ____________ Start time: ____________ Elizebeth Koller time:  ____________ Date: ____________ Movements: ____________ Start time: ____________ Elizebeth Koller time: ____________ Date: ____________ Movements: ____________ Start time: ____________ Elizebeth Koller time: ____________ Date: ____________ Movements: ____________ Start time: ____________ Elizebeth Koller time: ____________ Date: ____________ Movements: ____________ Start time: ____________ Elizebeth Koller time: ____________ Date: ____________ Movements: ____________ Start time: ____________ Elizebeth Koller time: ____________ Date: ____________ Movements: ____________ Start time: ____________ Elizebeth Koller time: ____________  Date: ____________ Movements: ____________ Start time: ____________ Elizebeth Koller time: ____________ Date: ____________ Movements: ____________ Start time: ____________ Elizebeth Koller time: ____________ Date: ____________ Movements: ____________ Start time: ____________ Elizebeth Koller time: ____________ Date: ____________ Movements: ____________ Start time: ____________ Elizebeth Koller time: ____________ Date: ____________ Movements: ____________ Start time: ____________ Elizebeth Koller time: ____________ Date: ____________ Movements: ____________ Start time: ____________ Elizebeth Koller time: ____________ Date: ____________ Movements: ____________ Start time: ____________ Elizebeth Koller time: ____________  Date: ____________ Movements: ____________ Start time: ____________ Elizebeth Koller time: ____________ Date: ____________ Movements:  ____________ Start time: ____________ Elizebeth Koller time: ____________ Date: ____________ Movements: ____________ Start time: ____________ Elizebeth Koller time: ____________ Date: ____________ Movements: ____________ Start time: ____________ Elizebeth Koller time: ____________ Date: ____________ Movements: ____________ Start time: ____________ Elizebeth Koller time: ____________ Date: ____________ Movements: ____________ Start time: ____________ Elizebeth Koller time: ____________ Date: ____________ Movements: ____________ Start time: ____________ Elizebeth Koller time: ____________  Date: ____________ Movements: ____________ Start time: ____________ Elizebeth Koller time: ____________ Date: ____________ Movements: ____________ Start time: ____________ Elizebeth Koller time: ____________ Date: ____________ Movements: ____________ Start time: ____________ Elizebeth Koller time: ____________ Date: ____________ Movements: ____________ Start time: ____________ Elizebeth Koller time: ____________ Date: ____________ Movements: ____________ Start time: ____________ Elizebeth Koller time: ____________ Date: ____________ Movements: ____________ Start time: ____________ Elizebeth Koller time: ____________ Document Released: 07/22/2006 Document Revised: 06/08/2012 Document Reviewed: 04/18/2012 ExitCare Patient Information 2015 [employer]. This information is not intended to replace advice given to you by your health care provider. Make sure you discuss any questions you have with your health care provider.

## 2014-01-08 NOTE — Progress Notes (Signed)
Reports less than 10 movement/2 hours.  NST ordered, and was reactive on review.  Growth scan also ordered given habitus. No other complaints or concerns.  Fetal movement and labor precautions reviewed.

## 2014-01-09 ENCOUNTER — Other Ambulatory Visit: Payer: Self-pay | Admitting: Obstetrics & Gynecology

## 2014-01-09 ENCOUNTER — Ambulatory Visit (HOSPITAL_COMMUNITY)
Admission: RE | Admit: 2014-01-09 | Discharge: 2014-01-09 | Disposition: A | Discharge status: Home / Self Care | Payer: Medicaid Other | Source: Ambulatory Visit | Attending: Obstetrics & Gynecology | Admitting: Obstetrics & Gynecology

## 2014-01-09 DIAGNOSIS — O3660X0 Maternal care for excessive fetal growth, unspecified trimester, not applicable or unspecified: Secondary | ICD-10-CM | POA: Diagnosis not present

## 2014-01-09 DIAGNOSIS — O30009 Twin pregnancy, unspecified number of placenta and unspecified number of amniotic sacs, unspecified trimester: Secondary | ICD-10-CM | POA: Diagnosis not present

## 2014-01-09 DIAGNOSIS — O3663X1 Maternal care for excessive fetal growth, third trimester, fetus 1: Secondary | ICD-10-CM

## 2014-01-15 ENCOUNTER — Encounter (HOSPITAL_COMMUNITY): Payer: Self-pay | Admitting: *Deleted

## 2014-01-15 ENCOUNTER — Encounter: Payer: Medicaid Other | Admitting: Family Medicine

## 2014-01-15 ENCOUNTER — Inpatient Hospital Stay (HOSPITAL_COMMUNITY)
Admission: AD | Admit: 2014-01-15 | Discharge: 2014-01-17 | DRG: 775 | Disposition: A | Discharge status: Home / Self Care | Payer: BC Managed Care – PPO | Source: Ambulatory Visit | Attending: Family Medicine | Admitting: Family Medicine

## 2014-01-15 ENCOUNTER — Inpatient Hospital Stay (HOSPITAL_COMMUNITY): Payer: BC Managed Care – PPO | Admitting: Anesthesiology

## 2014-01-15 ENCOUNTER — Encounter (HOSPITAL_COMMUNITY): Payer: BC Managed Care – PPO | Admitting: Anesthesiology

## 2014-01-15 DIAGNOSIS — O99214 Obesity complicating childbirth: Secondary | ICD-10-CM

## 2014-01-15 DIAGNOSIS — Z22330 Carrier of Group B streptococcus: Secondary | ICD-10-CM

## 2014-01-15 DIAGNOSIS — Z6841 Body Mass Index (BMI) 40.0 and over, adult: Secondary | ICD-10-CM

## 2014-01-15 DIAGNOSIS — O99820 Streptococcus B carrier state complicating pregnancy: Secondary | ICD-10-CM

## 2014-01-15 DIAGNOSIS — O9989 Other specified diseases and conditions complicating pregnancy, childbirth and the puerperium: Secondary | ICD-10-CM

## 2014-01-15 DIAGNOSIS — O99892 Other specified diseases and conditions complicating childbirth: Secondary | ICD-10-CM | POA: Diagnosis present

## 2014-01-15 DIAGNOSIS — E559 Vitamin D deficiency, unspecified: Secondary | ICD-10-CM

## 2014-01-15 DIAGNOSIS — Z8249 Family history of ischemic heart disease and other diseases of the circulatory system: Secondary | ICD-10-CM | POA: Diagnosis not present

## 2014-01-15 DIAGNOSIS — O479 False labor, unspecified: Secondary | ICD-10-CM | POA: Diagnosis present

## 2014-01-15 DIAGNOSIS — O99210 Obesity complicating pregnancy, unspecified trimester: Secondary | ICD-10-CM

## 2014-01-15 DIAGNOSIS — E669 Obesity, unspecified: Secondary | ICD-10-CM | POA: Diagnosis present

## 2014-01-15 DIAGNOSIS — IMO0001 Reserved for inherently not codable concepts without codable children: Secondary | ICD-10-CM

## 2014-01-15 DIAGNOSIS — Z9884 Bariatric surgery status: Secondary | ICD-10-CM

## 2014-01-15 DIAGNOSIS — G4730 Sleep apnea, unspecified: Secondary | ICD-10-CM | POA: Diagnosis present

## 2014-01-15 LAB — TYPE AND SCREEN
ABO/RH(D): B POS
Antibody Screen: NEGATIVE

## 2014-01-15 LAB — CBC
HCT: 35.6 % — ABNORMAL LOW (ref 36.0–46.0)
HEMOGLOBIN: 10.9 g/dL — AB (ref 12.0–15.0)
MCH: 23.1 pg — ABNORMAL LOW (ref 26.0–34.0)
MCHC: 30.6 g/dL (ref 30.0–36.0)
MCV: 75.4 fL — ABNORMAL LOW (ref 78.0–100.0)
Platelets: 202 10*3/uL (ref 150–400)
RBC: 4.72 MIL/uL (ref 3.87–5.11)
RDW: 24.5 % — ABNORMAL HIGH (ref 11.5–15.5)
WBC: 11.6 10*3/uL — AB (ref 4.0–10.5)

## 2014-01-15 LAB — RPR

## 2014-01-15 LAB — OB RESULTS CONSOLE GBS: GBS: POSITIVE

## 2014-01-15 MED ORDER — MISOPROSTOL 200 MCG PO TABS
ORAL_TABLET | ORAL | Status: AC
Start: 2014-01-15 — End: 2014-01-15
  Administered 2014-01-15: 800 ug via RECTAL
  Filled 2014-01-15: qty 4

## 2014-01-15 MED ORDER — OXYTOCIN 40 UNITS IN LACTATED RINGERS INFUSION - SIMPLE MED
62.5000 mL/h | INTRAVENOUS | Status: DC
  Filled 2014-01-15: qty 1000

## 2014-01-15 MED ORDER — LIDOCAINE HCL (PF) 1 % IJ SOLN
30.0000 mL | INTRAMUSCULAR | Status: DC | PRN
  Filled 2014-01-15: qty 30

## 2014-01-15 MED ORDER — ZOLPIDEM TARTRATE 5 MG PO TABS: 5.0000 mg | ORAL_TABLET | Freq: Every evening | ORAL | Status: DC | PRN

## 2014-01-15 MED ORDER — PHENYLEPHRINE 40 MCG/ML (10ML) SYRINGE FOR IV PUSH (FOR BLOOD PRESSURE SUPPORT)
80.0000 ug | PREFILLED_SYRINGE | INTRAVENOUS | Status: DC | PRN
  Filled 2014-01-15: qty 10
  Filled 2014-01-15: qty 2

## 2014-01-15 MED ORDER — ONDANSETRON HCL 4 MG PO TABS: 4.0000 mg | ORAL_TABLET | ORAL | Status: DC | PRN

## 2014-01-15 MED ORDER — ONDANSETRON HCL 4 MG/2ML IJ SOLN: 4.0000 mg | Freq: Four times a day (QID) | INTRAMUSCULAR | Status: DC | PRN

## 2014-01-15 MED ORDER — EPHEDRINE 5 MG/ML INJ
10.0000 mg | INTRAVENOUS | Status: DC | PRN
  Filled 2014-01-15: qty 2

## 2014-01-15 MED ORDER — OXYCODONE-ACETAMINOPHEN 5-325 MG PO TABS: 1.0000 | ORAL_TABLET | ORAL | Status: DC | PRN

## 2014-01-15 MED ORDER — BENZOCAINE-MENTHOL 20-0.5 % EX AERO
1.0000 "application " | INHALATION_SPRAY | CUTANEOUS | Status: DC | PRN
  Administered 2014-01-15: 1 via TOPICAL
  Filled 2014-01-15 (×2): qty 56

## 2014-01-15 MED ORDER — LACTATED RINGERS IV SOLN: 500.0000 mL | INTRAVENOUS | Status: DC | PRN

## 2014-01-15 MED ORDER — IBUPROFEN 600 MG PO TABS
600.0000 mg | ORAL_TABLET | Freq: Four times a day (QID) | ORAL | Status: DC
  Administered 2014-01-15 – 2014-01-17 (×8): 600 mg via ORAL
  Filled 2014-01-15 (×8): qty 1

## 2014-01-15 MED ORDER — DIPHENHYDRAMINE HCL 25 MG PO CAPS: 25.0000 mg | ORAL_CAPSULE | Freq: Four times a day (QID) | ORAL | Status: DC | PRN

## 2014-01-15 MED ORDER — LANOLIN HYDROUS EX OINT: TOPICAL_OINTMENT | CUTANEOUS | Status: DC | PRN

## 2014-01-15 MED ORDER — ACETAMINOPHEN 325 MG PO TABS: 650.0000 mg | ORAL_TABLET | ORAL | Status: DC | PRN

## 2014-01-15 MED ORDER — SODIUM CHLORIDE 0.9 % IV SOLN
1.0000 g | INTRAVENOUS | Status: DC
  Administered 2014-01-15 (×2): 1 g via INTRAVENOUS
  Filled 2014-01-15 (×6): qty 1000

## 2014-01-15 MED ORDER — OXYCODONE-ACETAMINOPHEN 5-325 MG PO TABS
1.0000 | ORAL_TABLET | ORAL | Status: DC | PRN
  Administered 2014-01-15: 1 via ORAL
  Administered 2014-01-16 – 2014-01-17 (×3): 2 via ORAL
  Filled 2014-01-15: qty 2
  Filled 2014-01-15: qty 1
  Filled 2014-01-15 (×2): qty 2

## 2014-01-15 MED ORDER — SODIUM CHLORIDE 0.9 % IV SOLN
2.0000 g | Freq: Once | INTRAVENOUS | Status: AC
  Administered 2014-01-15: 2 g via INTRAVENOUS
  Filled 2014-01-15: qty 2000

## 2014-01-15 MED ORDER — SIMETHICONE 80 MG PO CHEW: 80.0000 mg | CHEWABLE_TABLET | ORAL | Status: DC | PRN

## 2014-01-15 MED ORDER — LIDOCAINE HCL (PF) 1 % IJ SOLN
INTRAMUSCULAR | Status: DC | PRN
  Administered 2014-01-15 (×2): 5 mL

## 2014-01-15 MED ORDER — WITCH HAZEL-GLYCERIN EX PADS: 1.0000 "application " | MEDICATED_PAD | CUTANEOUS | Status: DC | PRN

## 2014-01-15 MED ORDER — FENTANYL CITRATE 0.05 MG/ML IJ SOLN: 100.0000 ug | INTRAMUSCULAR | Status: DC | PRN

## 2014-01-15 MED ORDER — CITRIC ACID-SODIUM CITRATE 334-500 MG/5ML PO SOLN: 30.0000 mL | ORAL | Status: DC | PRN

## 2014-01-15 MED ORDER — MISOPROSTOL 200 MCG PO TABS
800.0000 ug | ORAL_TABLET | Freq: Once | ORAL | Status: AC
Start: 2014-01-15 — End: 2014-01-15
  Administered 2014-01-15: 800 ug via RECTAL

## 2014-01-15 MED ORDER — PHENYLEPHRINE 40 MCG/ML (10ML) SYRINGE FOR IV PUSH (FOR BLOOD PRESSURE SUPPORT)
80.0000 ug | PREFILLED_SYRINGE | INTRAVENOUS | Status: DC | PRN
  Filled 2014-01-15: qty 2

## 2014-01-15 MED ORDER — IBUPROFEN 600 MG PO TABS: 600.0000 mg | ORAL_TABLET | Freq: Four times a day (QID) | ORAL | Status: DC | PRN

## 2014-01-15 MED ORDER — OXYTOCIN BOLUS FROM INFUSION
500.0000 mL | INTRAVENOUS | Status: DC
  Administered 2014-01-15: 500 mL via INTRAVENOUS

## 2014-01-15 MED ORDER — DIPHENHYDRAMINE HCL 50 MG/ML IJ SOLN: 12.5000 mg | INTRAMUSCULAR | Status: DC | PRN

## 2014-01-15 MED ORDER — LACTATED RINGERS IV SOLN
500.0000 mL | Freq: Once | INTRAVENOUS | Status: AC
  Administered 2014-01-15: 500 mL via INTRAVENOUS

## 2014-01-15 MED ORDER — FENTANYL 2.5 MCG/ML BUPIVACAINE 1/10 % EPIDURAL INFUSION (WH - ANES)
14.0000 mL/h | INTRAMUSCULAR | Status: DC | PRN
  Administered 2014-01-15 (×2): 14 mL/h via EPIDURAL
  Filled 2014-01-15 (×2): qty 125

## 2014-01-15 MED ORDER — SENNOSIDES-DOCUSATE SODIUM 8.6-50 MG PO TABS
2.0000 | ORAL_TABLET | ORAL | Status: DC
  Administered 2014-01-15 – 2014-01-17 (×2): 2 via ORAL
  Filled 2014-01-15 (×2): qty 2

## 2014-01-15 MED ORDER — TETANUS-DIPHTH-ACELL PERTUSSIS 5-2.5-18.5 LF-MCG/0.5 IM SUSP
0.5000 mL | Freq: Once | INTRAMUSCULAR | Status: AC
  Administered 2014-01-16: 0.5 mL via INTRAMUSCULAR
  Filled 2014-01-15: qty 0.5

## 2014-01-15 MED ORDER — PRENATAL MULTIVITAMIN CH
1.0000 | ORAL_TABLET | Freq: Every day | ORAL | Status: DC
  Administered 2014-01-16 – 2014-01-17 (×2): 1 via ORAL
  Filled 2014-01-15 (×2): qty 1

## 2014-01-15 MED ORDER — DIBUCAINE 1 % RE OINT: 1.0000 "application " | TOPICAL_OINTMENT | RECTAL | Status: DC | PRN

## 2014-01-15 MED ORDER — LACTATED RINGERS IV SOLN
INTRAVENOUS | Status: DC
  Administered 2014-01-15 (×2): via INTRAVENOUS

## 2014-01-15 MED ORDER — ONDANSETRON HCL 4 MG/2ML IJ SOLN: 4.0000 mg | INTRAMUSCULAR | Status: DC | PRN

## 2014-01-15 NOTE — Anesthesia Procedure Notes (Signed)
Epidural Patient location during procedure: OB Start time: 01/15/2014 3:18 AM  Staffing Anesthesiologist: Rudean Curt Performed by: anesthesiologist   Preanesthetic Checklist Completed: patient identified, site marked, surgical consent, pre-op evaluation, timeout performed, IV checked, risks and benefits discussed and monitors and equipment checked  Epidural Patient position: sitting Prep: site prepped and draped and DuraPrep Patient monitoring: continuous pulse ox and blood pressure Approach: midline Location: L3-L4 Injection technique: LOR air  Needle:  Needle type: Tuohy  Needle gauge: 17 G Needle length: 9 cm and 9 Needle insertion depth: 9 cm Catheter type: closed end flexible Catheter size: 19 Gauge Catheter at skin depth: 15 cm Test dose: negative  Assessment Events: blood not aspirated, injection not painful, no injection resistance, negative IV test and no paresthesia  Additional Notes Patient identified.  Risk benefits discussed including failed block, incomplete pain control, headache, nerve damage, paralysis, blood pressure changes, nausea, vomiting, reactions to medication both toxic or allergic, and postpartum back pain.  Patient expressed understanding and wished to proceed.  All questions were answered.  Sterile technique used throughout procedure and epidural site dressed with sterile barrier dressing. No paresthesia or other complications noted.The patient did not experience any signs of intravascular injection such as tinnitus or metallic taste in mouth nor signs of intrathecal spread such as rapid motor block. Please see nursing notes for vital signs.

## 2014-01-15 NOTE — H&P (Signed)
[name] is a 28 y.o. female presenting for Ctx which started around 9pm this evening, occuring every 3-5 minutes, +Fm, no vaginal bleeding, no LOF. + bloody show  PNC orig at [facility] then changed to [facility]  28 wks 64%ile > recheck growth at 34 wks [facility]  Dating LMP/Ultrasound:  12 weeks        Ultrasound consistent with LMP: Yes  Genetic Screen Quad: WNL                   Anatomic US WNL but incomplete--needs f/u in 4-6 wks > continued poor view secondary to weight  GTT Early:  54             Third trimester: 109  TDaP vaccine Declined  Flu vaccine  Declined  GBS  pos  Baby Food  Breast/bottle  Contraception  Pill  Circumcision  desired  Support Person FOB, Mother  Pediatrician [facility]     History OB History   Grav Para Term Preterm Abortions TAB SAB Ect Mult Living   2    1  1    0     1. SAB at 11weeks 2. Current   Past Medical History  Diagnosis Date  . No pertinent past medical history   . Anemia   . Morbid obesity   . Sleep apnea     hx of  . Blood transfusion without reported diagnosis 2005   Past Surgical History  Procedure Laterality Date  . Breath tek h pylori  02/09/2012    Procedure: BREATH TEK H PYLORI;  Surgeon: Pedro Earls, MD;  Location: [facility];  Service: General;  Laterality: N/A;  . Gastric bypass  08/29/2012  . Hernia repair  2014   Family History: family history includes Hypertension in her father and mother. There is no history of Alcohol abuse, Arthritis, Cancer, Early death, Heart disease, Hyperlipidemia, Kidney disease, or Stroke. Social History:  reports that she has never smoked. She has never used smokeless tobacco. She reports that she does not drink alcohol or use illicit drugs.   Prenatal Transfer Tool  Maternal Diabetes: No Genetic Screening: Normal Maternal Ultrasounds/Referrals: Normal Fetal Ultrasounds or other Referrals:  None Maternal Substance Abuse:  No Significant Maternal Medications:   None Significant Maternal Lab Results:  Lab values include: Group B Strep positive Other Comments:  None  ROS Neg unless noted in the HPI  Dilation: 5 Effacement (%): 100 Station: -2 Exam by:: Joya Gaskins RN Blood pressure 153/78, pulse 70, temperature 98.6 F (37 C), temperature source Oral, resp. rate 18, height 5\' 10"  (1.778 m), weight 151.774 kg (334 lb 9.6 oz), last menstrual period 04/12/2013. Exam Physical Exam  Constitutional: She is oriented to person, place, and time. She appears well-developed and well-nourished.  HENT:  Head: Normocephalic and atraumatic.  Eyes: EOM are normal.  Neck: Neck supple.  Cardiovascular: Normal rate.   Respiratory: Effort normal.  GI: Soft.  Gravid   Musculoskeletal: Normal range of motion.  Neurological: She is alert and oriented to person, place, and time.  Skin: Skin is warm and dry.  Psychiatric: She has a normal mood and affect. Her behavior is normal.    Prenatal labs: ABO, Rh: B/POS/-- (12/04 1915) Antibody: NEG (12/04 1915) Rubella: 0.42 (12/04 1915) RPR: NON REAC (04/16 0849)  HBsAg: NEGATIVE (12/04 1915)  HIV: NONREACTIVE (04/16 0849)  GBS: Positive (07/13 0000)   Dilation: 5 Effacement (%): 100 Cervical Position: Middle Station: -2 Presentation: Vertex Exam  by:: Joya Gaskins RN   Assessment/Plan: Ms Mccallister is a 28 y.o G2P0010 at 39w5 who presents in active labor, will admit to L&D  #Labor- expectant management #Fetal HT- cat I tracing #ID- post GBS, will start amp; HIV neg #MOC- POP #MOF- breast and bottle #Circ- desires in house  Case d/w Lindaann Slough, Leeroy Cha 01/15/2014, 12:45 AM  I examined pt and agree with documentation above and resident plan of care. [place], CNM

## 2014-01-15 NOTE — MAU Note (Signed)
Contractions tonight. Denies vaginal bleeding or LOF. Positive fetal movement. Dilated 0.5 cm in office last week.

## 2014-01-15 NOTE — Progress Notes (Signed)
Patient ID: [name], female   DOB: [birth year], 28 y.o.   MRN: [id number] [name] is a 28 y.o. G2P0010 at [redacted]w[redacted]d admitted for labor  Subjective: Comfortable, but feels pelvic pressure with UCs.  Objective: BP 145/75  Pulse 84  Temp(Src) 98.2 F (36.8 C) (Oral)  Resp 20  Ht 5\' 10"  (1.778 m)  Wt 151.501 kg (334 lb)  BMI 47.92 kg/m2  SpO2 97%  LMP 04/12/2013  Fetal Heart FHR: 150 bpm, variability: moderate,  accelerations:  Present,  decelerations:  Absent   Contractions: q 2-37min  SVE:   Dilation: 10 Effacement (%): 100 Station: +1 Exam by:: D. Tamia Dial, CNM Some descent with trial push; watery mecomium  Assessment / Plan:  Labor: Begin active 2nd stage Fetal Wellbeing: Category 1 Pain Control:  adequate Expected mode of delivery: NSVD  [name] 01/15/2014, 11:45 AM

## 2014-01-15 NOTE — Lactation Note (Signed)
This note was copied from the chart of [name]. Lactation Consultation Note Initial visit at 5 hours of age.  Mom reports a good feeding after delivery and baby has now been sleepy.  Attempted with position instructions given.  Peds Dr. To room for assessment and baby is showing feeding cues now.  Attempted cross cradle on right breast.  Mom has flat nipples so hand pump was used and helped some.  Baby is able to get a deep wide latch with good strong jaw excursions.  Left nipple is flat and inverts with compression.  Baby remains latched for 8 minutes and continues feeding.  [facility] Early LC resources given and discussed.  Encouraged to feed with early cues on demand.  Early newborn behavior discussed.  Hand expression demonstrated with colostrum visible.  Mom to call for assist as needed.    Patient Name: [name] [id number] Date: 01/15/2014 Reason for consult: Initial assessment   Maternal Data Has patient been taught Hand Expression?: Yes Does the patient have breastfeeding experience prior to this delivery?: No  Feeding Feeding Type: Breast Fed Length of feed:  (observed 8 minutes)  LATCH Score/Interventions Latch: Grasps breast easily, tongue down, lips flanged, rhythmical sucking.  Audible Swallowing: A few with stimulation  Type of Nipple: Flat  Comfort (Breast/Nipple): Soft / non-tender     Hold (Positioning): Assistance needed to correctly position infant at breast and maintain latch.  LATCH Score: 7  Lactation Tools Discussed/Used Tools: Pump WIC Program: Yes   Consult Status Consult Status: Follow-up Date: 01/16/14 Follow-up type: In-patient    Justice Britain 01/15/2014, 6:08 PM

## 2014-01-15 NOTE — Progress Notes (Signed)
Patient ID: [name], female   DOB: 18-May-1986, 28 y.o.   MRN: [id number] [name] is a 28 y.o. G2P0010 at [redacted]w[redacted]d admitted for SOOL  Subjective: Comfortable with epidural. No urge to push. Had SROM and moderate meconium at 0830.   Objective: BP 126/53  Pulse 89  Temp(Src) 98.6 F (37 C) (Oral)  Resp 20  Ht 5\' 10"  (1.778 m)  Wt 151.501 kg (334 lb)  BMI 47.92 kg/m2  SpO2 97%  LMP 04/12/2013  Abd soft, EFW9# Fetal Heart FHR: 145-150 bpm, variability: moderate,  accelerations:  Present,  decelerations:  Absent Accels to 10 bpm preently, reactive earlier  Contractions: q 2 min  SVE:   Dilation: 10 Effacement (%): 100 Station: 0 Exam by:: D. Deneka Greenwalt, CNM [place] mec. LOA  Assessment / Plan:  Labor: passive 2nd stage, progressive Fetal Wellbeing: Category1 Pain Control:  adequate Expected mode of delivery: NSVD GBS carriage> continue AMP IV  Demica Zook 01/15/2014, 10:12 AM

## 2014-01-15 NOTE — Anesthesia Preprocedure Evaluation (Signed)
Anesthesia Evaluation  Patient identified by MRN, date of birth, ID band Patient awake    Reviewed: Allergy & Precautions, H&P , Patient's Chart, lab work & pertinent test results  Airway Mallampati: III TM Distance: >3 FB Neck ROM: full    Dental   Pulmonary sleep apnea ,  breath sounds clear to auscultation        Cardiovascular Rhythm:regular Rate:Normal     Neuro/Psych    GI/Hepatic   Endo/Other  Morbid obesity  Renal/GU      Musculoskeletal   Abdominal   Peds  Hematology   Anesthesia Other Findings   Reproductive/Obstetrics (+) Pregnancy                           Anesthesia Physical Anesthesia Plan  ASA: III  Anesthesia Plan: Epidural   Post-op Pain Management:    Induction:   Airway Management Planned:   Additional Equipment:   Intra-op Plan:   Post-operative Plan:   Informed Consent: I have reviewed the patients History and Physical, chart, labs and discussed the procedure including the risks, benefits and alternatives for the proposed anesthesia with the patient or authorized representative who has indicated his/her understanding and acceptance.     Plan Discussed with:   Anesthesia Plan Comments:         Anesthesia Quick Evaluation

## 2014-01-15 NOTE — Progress Notes (Addendum)
[name] is a 28 y.o. G2P0010 at [redacted]w[redacted]d admitted for active labor  Subjective: Feeling a little more pressure down below Esp with bulging bag  Objective: BP 140/73  Pulse 76  Temp(Src) 98.5 F (36.9 C) (Oral)  Resp 16  Ht 5\' 10"  (1.778 m)  Wt 151.501 kg (334 lb)  BMI 47.92 kg/m2  SpO2 97%  LMP 04/12/2013      FHT:  FHR: 140 bpm, variability: moderate,  accelerations:  Present,  decelerations:  Present variable UC:   q2-4 SVE:   Dilation: 9 Effacement (%): 100 Station: -1 Exam by:: Dr. Skeet Simmer  Labs: Lab Results  Component Value Date   WBC 11.6* 01/15/2014   HGB 10.9* 01/15/2014   HCT 35.6* 01/15/2014   MCV 75.4* 01/15/2014   PLT 202 01/15/2014    Assessment / Plan: Spontaneous labor, progressing normally  Labor: Progressing normally; will cont to monitor and then AROM (pt with BBOW) Preeclampsia:  no signs or symptoms of toxicity Fetal Wellbeing:  Category II, positional changes  Pain Control:  Epidural and Fentanyl I/D:  amp Anticipated MOD:  NSVD  Langston Masker 01/15/2014, 5:26 AM

## 2014-01-16 ENCOUNTER — Encounter: Payer: Self-pay | Admitting: Obstetrics & Gynecology

## 2014-01-16 LAB — CBC
HCT: 26.9 % — ABNORMAL LOW (ref 36.0–46.0)
HEMOGLOBIN: 8.4 g/dL — AB (ref 12.0–15.0)
MCH: 23.6 pg — ABNORMAL LOW (ref 26.0–34.0)
MCHC: 31.2 g/dL (ref 30.0–36.0)
MCV: 75.6 fL — AB (ref 78.0–100.0)
Platelets: 170 10*3/uL (ref 150–400)
RBC: 3.56 MIL/uL — AB (ref 3.87–5.11)
RDW: 24.7 % — ABNORMAL HIGH (ref 11.5–15.5)
WBC: 12.3 10*3/uL — ABNORMAL HIGH (ref 4.0–10.5)

## 2014-01-16 NOTE — Anesthesia Postprocedure Evaluation (Signed)
  Anesthesia Post-op Note  Patient: [name]  Procedure(s) Performed: * No procedures listed *  Patient Location: Mother/Baby  Anesthesia Type:Epidural  Level of Consciousness: awake, alert , oriented and patient cooperative  Airway and Oxygen Therapy: Patient Spontanous Breathing  Post-op Pain: none  Post-op Assessment: Post-op Vital signs reviewed, Patient's Cardiovascular Status Stable, Respiratory Function Stable, Patent Airway, No signs of Nausea or vomiting, Adequate PO intake, Pain level controlled, No headache, No backache, No residual numbness and No residual motor weakness  Post-op Vital Signs: Reviewed and stable  Last Vitals:  Filed Vitals:   01/16/14 0625  BP: 111/73  Pulse: 82  Temp: 36.8 C  Resp: 20    Complications: No apparent anesthesia complications

## 2014-01-16 NOTE — Progress Notes (Signed)
Post Partum Day 1 Subjective: no complaints, up ad lib and voiding Feeling somewhat sleepy Denies SOB, chest palps, dizziness   Objective: Blood pressure 111/73, pulse 82, temperature 98.2 F (36.8 C), temperature source Oral, resp. rate 20, height 5\' 10"  (1.778 m), weight 151.501 kg (334 lb), last menstrual period 04/12/2013, SpO2 99.00%, unknown if currently breastfeeding.  Physical Exam:  General: alert and cooperative Lochia: appropriate Uterine Fundus: firm Incision: n.a DVT Evaluation: No evidence of DVT seen on physical exam. No cords or calf tenderness. No significant calf/ankle edema.   Recent Labs  01/15/14 0055 01/16/14 0605  HGB 10.9* 8.4*  HCT 35.6* 26.9*    Assessment/Plan: Plan for discharge tomorrow, Breastfeeding and Contraception POP Hemodynamically stable, will cont to monitor hemoglobin given atony and vag sulcus laceration with EBL approx 600   LOS: 1 day   Langston Masker 01/16/2014, 7:22 AM   I have seen and examined this patient and agree with above documentation in the resident's note.   Ebbie Latus, M.D. [facility] Fellow 01/16/2014 10:04 AM

## 2014-01-17 MED ORDER — IBUPROFEN 600 MG PO TABS: 600.0000 mg | ORAL_TABLET | Freq: Four times a day (QID) | ORAL | Status: DC

## 2014-01-17 MED ORDER — MEASLES, MUMPS & RUBELLA VAC ~~LOC~~ INJ
0.5000 mL | INJECTION | Freq: Once | SUBCUTANEOUS | Status: AC
  Administered 2014-01-17: 0.5 mL via SUBCUTANEOUS
  Filled 2014-01-17 (×2): qty 0.5

## 2014-01-17 MED ORDER — NORETHINDRONE 0.35 MG PO TABS: 1.0000 | ORAL_TABLET | Freq: Every day | ORAL | Status: DC

## 2014-01-17 NOTE — Discharge Summary (Signed)
Obstetric Discharge Summary Reason for Admission: onset of labor Prenatal Procedures: none Intrapartum Procedures: spontaneous vaginal delivery and GBS prophylaxis Postpartum Procedures: none Complications-Operative and Postpartum: 1st degree perineal laceration and vaginal laceration Hemoglobin  Date Value Ref Range Status  01/16/2014 8.4* 12.0 - 15.0 g/dL Final     DELTA CHECK NOTED     REPEATED TO VERIFY     HCT  Date Value Ref Range Status  01/16/2014 26.9* 36.0 - 46.0 % Final    Physical Exam:  General: alert, cooperative and no distress Lochia: appropriate Uterine Fundus: firm Incision: N/A  DVT Evaluation: No evidence of DVT seen on physical exam. Negative Homan's sign. No cords or calf tenderness. No significant calf/ankle edema.  Discharge Diagnoses: Term Pregnancy-delivered  Discharge Information: Date: 01/17/2014 Activity: pelvic rest Diet: routine Medications: PNV and Ibuprofen Condition: stable Instructions: refer to practice specific booklet Discharge to: home  Ms. [name] is a 28 year old G2P1011 who presented to the MAU on 01/15/14 for active labor. She later delivered via NVSD with an epidural the same day. During delivery, Ms. [name] had a 1st degree Perineal tear with a bilateral horseshoe shaped deep vaginal lacerations. She lost about 622mL of blood and was repaired with sutures. Today she reports feeling well despite some dysuria. She is tolerating PO, up ad lib and has had a bowel movement. She is breast and bottle feeding her baby boy and plans on POP for contraception. Baby boy was circumcised yesterday.   Newborn Data: Live born female  Birth Weight: 7 lb 8 oz (3402 g) APGAR: 8, 9  Home with mother.  Gaetano Hawthorne 01/17/2014, 7:38 AM  I have seen and examined this patient and agree with above documentation in the PA student's note.   Ebbie Latus, M.D. [facility] Fellow 01/17/2014 10:25 AM

## 2014-01-17 NOTE — Discharge Instructions (Signed)
Vaginal Delivery Care After Refer to this sheet in the next few weeks. These discharge instructions provide you with information on caring for yourself after delivery. Your caregiver may also give you specific instructions. Your treatment has been planned according to the most current medical practices available, but problems sometimes occur. Call your caregiver if you have any problems or questions after you go home. HOME CARE INSTRUCTIONS  Take over-the-counter or prescription medicines only as directed by your caregiver or pharmacist.  Do not drink alcohol, especially if you are breastfeeding or taking medicine to relieve pain.  Do not chew or smoke tobacco.  Do not use illegal drugs.  Continue to use good perineal care. Good perineal care includes:  Wiping your perineum from front to back.  Keeping your perineum clean.  Do not use tampons or douche until your caregiver says it is okay.  Shower, wash your hair, and take tub baths as directed by your caregiver.  Wear a well-fitting bra that provides breast support.  Eat healthy foods.  Drink enough fluids to keep your urine clear or pale yellow.  Eat high-fiber foods such as whole grain cereals and breads, brown rice, beans, and fresh fruits and vegetables every day. These foods may help prevent or relieve constipation.  Follow your cargiver's recommendations regarding resumption of activities such as climbing stairs, driving, lifting, exercising, or traveling.  Talk to your caregiver about resuming sexual activities. Resumption of sexual activities is dependent upon your risk of infection, your rate of healing, and your comfort and desire to resume sexual activity.  Try to have someone help you with your household activities and your newborn for at least a few days after you leave the hospital.  Rest as much as possible. Try to rest or take a nap when your newborn is sleeping.  Increase your activities gradually.  Keep all  of your scheduled postpartum appointments. It is very important to keep your scheduled follow-up appointments. At these appointments, your caregiver will be checking to make sure that you are healing physically and emotionally. SEEK MEDICAL CARE IF:   You are passing large clots from your vagina. Save any clots to show your caregiver.  You have a foul smelling discharge from your vagina.  You have trouble urinating.  You are urinating frequently.  You have pain when you urinate.  You have a change in your bowel movements.  You have increasing redness, pain, or swelling near your vaginal incision (episiotomy) or vaginal tear.  You have pus draining from your episiotomy or vaginal tear.  Your episiotomy or vaginal tear is separating.  You have painful, hard, or reddened breasts.  You have a severe headache.  You have blurred vision or see spots.  You feel sad or depressed.  You have thoughts of hurting yourself or your newborn.  You have questions about your care, the care of your newborn, or medicines.  You are dizzy or lightheaded.  You have a rash.  You have nausea or vomiting.  You were breastfeeding and have not had a menstrual period within 12 weeks after you stopped breastfeeding.  You are not breastfeeding and have not had a menstrual period by the 12th week after delivery.  You have a fever. SEEK IMMEDIATE MEDICAL CARE IF:   You have persistent pain.  You have chest pain.  You have shortness of breath.  You faint.  You have leg pain.  You have stomach pain.  Your vaginal bleeding saturates two or more sanitary pads  in 1 hour. MAKE SURE YOU:   Understand these instructions.  Will watch your condition.  Will get help right away if you are not doing well or get worse. Document Released: 06/19/2000 Document Revised: 03/16/2012 Document Reviewed: 02/17/2012 [facility] Patient Information 2015 [place], [place]. This information is not intended to  replace advice given to you by your health care provider. Make sure you discuss any questions you have with your health care provider.

## 2014-01-17 NOTE — Discharge Summary (Signed)
Attestation of Attending Supervision of Fellow: Evaluation and management procedures were performed by the Fellow under my supervision and collaboration.  I have reviewed the Fellow's note and chart, and I agree with the management and plan.    

## 2014-01-23 ENCOUNTER — Inpatient Hospital Stay (HOSPITAL_COMMUNITY)
Admission: AD | Admit: 2014-01-23 | Discharge: 2014-01-23 | Disposition: A | Discharge status: Home / Self Care | Payer: BC Managed Care – PPO | Source: Ambulatory Visit | Attending: Obstetrics & Gynecology | Admitting: Obstetrics & Gynecology

## 2014-01-23 ENCOUNTER — Encounter (HOSPITAL_COMMUNITY): Payer: Self-pay

## 2014-01-23 DIAGNOSIS — E669 Obesity, unspecified: Secondary | ICD-10-CM | POA: Insufficient documentation

## 2014-01-23 DIAGNOSIS — O99893 Other specified diseases and conditions complicating puerperium: Secondary | ICD-10-CM | POA: Diagnosis not present

## 2014-01-23 DIAGNOSIS — R1012 Left upper quadrant pain: Secondary | ICD-10-CM | POA: Insufficient documentation

## 2014-01-23 DIAGNOSIS — O9989 Other specified diseases and conditions complicating pregnancy, childbirth and the puerperium: Principal | ICD-10-CM

## 2014-01-23 DIAGNOSIS — O99215 Obesity complicating the puerperium: Secondary | ICD-10-CM

## 2014-01-23 DIAGNOSIS — K5900 Constipation, unspecified: Secondary | ICD-10-CM | POA: Insufficient documentation

## 2014-01-23 LAB — COMPREHENSIVE METABOLIC PANEL
ALT: 15 U/L (ref 0–35)
AST: 16 U/L (ref 0–37)
Albumin: 2.8 g/dL — ABNORMAL LOW (ref 3.5–5.2)
Alkaline Phosphatase: 114 U/L (ref 39–117)
Anion gap: 13 (ref 5–15)
BUN: 10 mg/dL (ref 6–23)
CALCIUM: 8.8 mg/dL (ref 8.4–10.5)
CO2: 22 mEq/L (ref 19–32)
CREATININE: 0.67 mg/dL (ref 0.50–1.10)
Chloride: 104 mEq/L (ref 96–112)
GFR calc Af Amer: >90 mL/min (ref >90)
Glucose, Bld: 111 mg/dL — ABNORMAL HIGH (ref 70–99)
Potassium: 3.6 mEq/L — ABNORMAL LOW (ref 3.7–5.3)
Sodium: 139 mEq/L (ref 137–147)
TOTAL PROTEIN: 6.3 g/dL (ref 6.0–8.3)
Total Bilirubin: 0.2 mg/dL — ABNORMAL LOW (ref 0.3–1.2)

## 2014-01-23 LAB — CBC WITH DIFFERENTIAL/PLATELET
Basophils Absolute: 0 10*3/uL (ref 0.0–0.1)
Basophils Relative: 0 % (ref 0–1)
EOS ABS: 0.4 10*3/uL (ref 0.0–0.7)
Eosinophils Relative: 6 % — ABNORMAL HIGH (ref 0–5)
HEMATOCRIT: 30.3 % — AB (ref 36.0–46.0)
Hemoglobin: 9.2 g/dL — ABNORMAL LOW (ref 12.0–15.0)
Lymphocytes Relative: 25 % (ref 12–46)
Lymphs Abs: 1.8 10*3/uL (ref 0.7–4.0)
MCH: 23.2 pg — AB (ref 26.0–34.0)
MCHC: 30.4 g/dL (ref 30.0–36.0)
MCV: 76.5 fL — ABNORMAL LOW (ref 78.0–100.0)
MONO ABS: 0.5 10*3/uL (ref 0.1–1.0)
Monocytes Relative: 7 % (ref 3–12)
Neutro Abs: 4.4 10*3/uL (ref 1.7–7.7)
Neutrophils Relative %: 61 % (ref 43–77)
PLATELETS: 358 10*3/uL (ref 150–400)
RBC: 3.96 MIL/uL (ref 3.87–5.11)
RDW: 22.5 % — ABNORMAL HIGH (ref 11.5–15.5)
WBC: 7.1 10*3/uL (ref 4.0–10.5)

## 2014-01-23 LAB — URINE MICROSCOPIC-ADD ON

## 2014-01-23 LAB — URINALYSIS, ROUTINE W REFLEX MICROSCOPIC
GLUCOSE, UA: NEGATIVE mg/dL
Ketones, ur: 15 mg/dL — AB
Nitrite: NEGATIVE
Protein, ur: 30 mg/dL — AB
Urobilinogen, UA: 0.2 mg/dL (ref 0.0–1.0)
pH: 5.5 (ref 5.0–8.0)

## 2014-01-23 NOTE — Discharge Instructions (Signed)
Vaginal Delivery Care After Refer to this sheet in the next few weeks. These discharge instructions provide you with information on caring for yourself after delivery. Your caregiver may also give you specific instructions. Your treatment has been planned according to the most current medical practices available, but problems sometimes occur. Call your caregiver if you have any problems or questions after you go home. HOME CARE INSTRUCTIONS  Take over-the-counter or prescription medicines only as directed by your caregiver or pharmacist.  Do not drink alcohol, especially if you are breastfeeding or taking medicine to relieve pain.  Do not chew or smoke tobacco.  Do not use illegal drugs.  Continue to use good perineal care. Good perineal care includes:  Wiping your perineum from front to back.  Keeping your perineum clean.  Do not use tampons or douche until your caregiver says it is okay.  Shower, wash your hair, and take tub baths as directed by your caregiver.  Wear a well-fitting bra that provides breast support.  Eat healthy foods.  Drink enough fluids to keep your urine clear or pale yellow.  Eat high-fiber foods such as whole grain cereals and breads, brown rice, beans, and fresh fruits and vegetables every day. These foods may help prevent or relieve constipation.  Follow your cargiver's recommendations regarding resumption of activities such as climbing stairs, driving, lifting, exercising, or traveling.  Talk to your caregiver about resuming sexual activities. Resumption of sexual activities is dependent upon your risk of infection, your rate of healing, and your comfort and desire to resume sexual activity.  Try to have someone help you with your household activities and your newborn for at least a few days after you leave the hospital.  Rest as much as possible. Try to rest or take a nap when your newborn is sleeping.  Increase your activities gradually.  Keep all  of your scheduled postpartum appointments. It is very important to keep your scheduled follow-up appointments. At these appointments, your caregiver will be checking to make sure that you are healing physically and emotionally. SEEK MEDICAL CARE IF:   You are passing large clots from your vagina. Save any clots to show your caregiver.  You have a foul smelling discharge from your vagina.  You have trouble urinating.  You are urinating frequently.  You have pain when you urinate.  You have a change in your bowel movements.  You have increasing redness, pain, or swelling near your vaginal incision (episiotomy) or vaginal tear.  You have pus draining from your episiotomy or vaginal tear.  Your episiotomy or vaginal tear is separating.  You have painful, hard, or reddened breasts.  You have a severe headache.  You have blurred vision or see spots.  You feel sad or depressed.  You have thoughts of hurting yourself or your newborn.  You have questions about your care, the care of your newborn, or medicines.  You are dizzy or lightheaded.  You have a rash.  You have nausea or vomiting.  You were breastfeeding and have not had a menstrual period within 12 weeks after you stopped breastfeeding.  You are not breastfeeding and have not had a menstrual period by the 12th week after delivery.  You have a fever. SEEK IMMEDIATE MEDICAL CARE IF:   You have persistent pain.  You have chest pain.  You have shortness of breath.  You faint.  You have leg pain.  You have stomach pain.  Your vaginal bleeding saturates two or more sanitary pads  in 1 hour. MAKE SURE YOU:   Understand these instructions.  Will watch your condition.  Will get help right away if you are not doing well or get worse. Document Released: 06/19/2000 Document Revised: 03/16/2012 Document Reviewed: 02/17/2012 [facility] Patient Information 2015 [place], [place]. This information is not intended to  replace advice given to you by your health care provider. Make sure you discuss any questions you have with your health care provider.

## 2014-01-23 NOTE — MAU Provider Note (Signed)
History     CSN: [id number]  Arrival date and time: 01/23/14 2005   First Provider Initiated Contact with Patient 01/23/14 2124      Chief Complaint  Patient presents with  . Abdominal Pain   Abdominal Pain   [name] is a 28 y.o. a G2P1011 who is PP x 8 days. She states that she has had LUQ pain that comes and goes. When the pain come she states that it is very intense, but only lasts about 30 seconds or a minute. She is not having the pain right now. She denies any NV, fever or diarrhea. She states that she has been constipated, and it taking a stool softner.  Past Medical History  Diagnosis Date  . No pertinent past medical history   . Anemia   . Morbid obesity   . Sleep apnea     hx of  . Blood transfusion without reported diagnosis 2005    Past Surgical History  Procedure Laterality Date  . Breath tek h pylori  02/09/2012    Procedure: BREATH TEK H PYLORI;  Surgeon: Pedro Earls, MD;  Location: [facility];  Service: General;  Laterality: N/A;  . Gastric bypass  08/29/2012  . Hernia repair  2014    Family History  Problem Relation Age of Onset  . Hypertension Father   . Alcohol abuse Neg Hx   . Arthritis Neg Hx   . Cancer Neg Hx   . Early death Neg Hx   . Heart disease Neg Hx   . Hyperlipidemia Neg Hx   . Kidney disease Neg Hx   . Stroke Neg Hx   . Hypertension Mother     History  Substance Use Topics  . Smoking status: Never Smoker   . Smokeless tobacco: Never Used  . Alcohol Use: No    Allergies: No Known Allergies  Prescriptions prior to admission  Medication Sig Dispense Refill  . acetaminophen (TYLENOL) 500 MG tablet Take 1,000 mg by mouth daily as needed for moderate pain or headache.       . docusate sodium (COLACE) 100 MG capsule Take 200 mg by mouth daily as needed for mild constipation.      . ferrous sulfate (FERROUSUL) 325 (65 FE) MG tablet Take 1 tablet (325 mg total) by mouth 2 (two) times daily.  60 tablet  1  .  ibuprofen (ADVIL,MOTRIN) 600 MG tablet Take 1 tablet (600 mg total) by mouth every 6 (six) hours.  30 tablet  0  . Prenat-FeCbn-FeAspGl-FA-Omega (OB COMPLETE PETITE) 35-5-1-200 MG CAPS TAKE 1 CAPSULE BY MOUTH DAILY.  30 capsule  3  . vitamin D, CHOLECALCIFEROL, 400 UNITS tablet Take 2 tablets (800 Units total) by mouth daily.  60 tablet  1  . norethindrone (MICRONOR,CAMILA,ERRIN) 0.35 MG tablet Take 1 tablet (0.35 mg total) by mouth daily.  1 Package  1    Review of Systems  Gastrointestinal: Positive for abdominal pain.   Physical Exam   Blood pressure 130/79, pulse 85, temperature 99.1 F (37.3 C), temperature source Oral, resp. rate 20, height 5\' 10"  (1.778 m), weight 143.79 kg (317 lb), last menstrual period 04/12/2013, SpO2 100.00%, unknown if currently breastfeeding.  Physical Exam  Nursing note and vitals reviewed. Constitutional: She is oriented to person, place, and time. She appears well-developed and well-nourished. No distress.  Cardiovascular: Normal rate.   Respiratory: Effort normal.  GI: Soft. Bowel sounds are normal. She exhibits no distension and no mass. There is no  tenderness. There is no rebound and no guarding.  Neurological: She is alert and oriented to person, place, and time.  Skin: Skin is warm and dry.  Psychiatric: She has a normal mood and affect.    MAU Course  Procedures Results for orders placed during the hospital encounter of 01/23/14 (from the past 24 hour(s))  CBC WITH DIFFERENTIAL     Status: Abnormal   Collection Time    01/23/14  8:21 PM      Result Value Ref Range   WBC 7.1  4.0 - 10.5 K/uL   RBC 3.96  3.87 - 5.11 MIL/uL   Hemoglobin 9.2 (*) 12.0 - 15.0 g/dL   HCT 30.3 (*) 36.0 - 46.0 %   MCV 76.5 (*) 78.0 - 100.0 fL   MCH 23.2 (*) 26.0 - 34.0 pg   MCHC 30.4  30.0 - 36.0 g/dL   RDW 22.5 (*) 11.5 - 15.5 %   Platelets 358  150 - 400 K/uL   Neutrophils Relative % 61  43 - 77 %   Neutro Abs 4.4  1.7 - 7.7 K/uL   Lymphocytes Relative 25   12 - 46 %   Lymphs Abs 1.8  0.7 - 4.0 K/uL   Monocytes Relative 7  3 - 12 %   Monocytes Absolute 0.5  0.1 - 1.0 K/uL   Eosinophils Relative 6 (*) 0 - 5 %   Eosinophils Absolute 0.4  0.0 - 0.7 K/uL   Basophils Relative 0  0 - 1 %   Basophils Absolute 0.0  0.0 - 0.1 K/uL  COMPREHENSIVE METABOLIC PANEL     Status: Abnormal   Collection Time    01/23/14  8:21 PM      Result Value Ref Range   Sodium 139  137 - 147 mEq/L   Potassium 3.6 (*) 3.7 - 5.3 mEq/L   Chloride 104  96 - 112 mEq/L   CO2 22  19 - 32 mEq/L   Glucose, Bld 111 (*) 70 - 99 mg/dL   BUN 10  6 - 23 mg/dL   Creatinine, Ser 0.67  0.50 - 1.10 mg/dL   Calcium 8.8  8.4 - 10.5 mg/dL   Total Protein 6.3  6.0 - 8.3 g/dL   Albumin 2.8 (*) 3.5 - 5.2 g/dL   AST 16  0 - 37 U/L   ALT 15  0 - 35 U/L   Alkaline Phosphatase 114  39 - 117 U/L   Total Bilirubin 0.2 (*) 0.3 - 1.2 mg/dL   GFR calc non Af Amer >90  >90 mL/min   GFR calc Af Amer >90  >90 mL/min   Anion gap 13  5 - 15  URINALYSIS, ROUTINE W REFLEX MICROSCOPIC     Status: Abnormal   Collection Time    01/23/14  8:29 PM      Result Value Ref Range   Color, Urine YELLOW  YELLOW   APPearance CLEAR  CLEAR   Specific Gravity, Urine >1.030 (*) 1.005 - 1.030   pH 5.5  5.0 - 8.0   Glucose, UA NEGATIVE  NEGATIVE mg/dL   Hgb urine dipstick LARGE (*) NEGATIVE   Bilirubin Urine SMALL (*) NEGATIVE   Ketones, ur 15 (*) NEGATIVE mg/dL   Protein, ur 30 (*) NEGATIVE mg/dL   Urobilinogen, UA 0.2  0.0 - 1.0 mg/dL   Nitrite NEGATIVE  NEGATIVE   Leukocytes, UA TRACE (*) NEGATIVE  URINE MICROSCOPIC-ADD ON     Status: Abnormal   Collection  Time    01/23/14  8:29 PM      Result Value Ref Range   Squamous Epithelial / LPF FEW (*) RARE   WBC, UA 3-6  <3 WBC/hpf   RBC / HPF 11-20  <3 RBC/hpf   Bacteria, UA FEW (*) RARE   Urine-Other MUCOUS PRESENT      Assessment and Plan   1. LUQ pain    Likely gas/consitpation related Will continue to monitor sx if persist until 6 week PP check  consider GI referral Return to MAU as needed  Follow-up Information   Follow up with [facility]. (As scheduled for your 6 week postpartum check)    Specialty:  Obstetrics and Gynecology   Contact information:   [place] [address] [phone]       Mathis Bud 01/23/2014, 9:33 PM

## 2014-01-23 NOTE — MAU Note (Signed)
Pt reports she had a vaginal delivery on 01/15/2014. States prior to delivery she was having left mid abd pain and she thought is was related to the preg but it has continued after delivery. Also reports for the last 2-3 days she has had a pulling sensation in her right lower abd. Denies fever.

## 2014-01-25 ENCOUNTER — Telehealth: Payer: Self-pay | Admitting: *Deleted

## 2014-01-25 NOTE — Telephone Encounter (Signed)
Attempted to contact patient, no answer, left message that FMLA papers were completed and ready for pick up.  Note on next appointment.

## 2014-01-26 ENCOUNTER — Encounter: Payer: Self-pay | Admitting: General Practice

## 2014-02-16 ENCOUNTER — Encounter: Payer: Self-pay | Admitting: General Practice

## 2014-02-21 ENCOUNTER — Encounter: Payer: Self-pay | Admitting: Obstetrics & Gynecology

## 2014-02-21 ENCOUNTER — Ambulatory Visit (INDEPENDENT_AMBULATORY_CARE_PROVIDER_SITE_OTHER): Payer: BC Managed Care – PPO | Admitting: Obstetrics & Gynecology

## 2014-02-21 DIAGNOSIS — R399 Unspecified symptoms and signs involving the genitourinary system: Secondary | ICD-10-CM

## 2014-02-21 DIAGNOSIS — R3989 Other symptoms and signs involving the genitourinary system: Secondary | ICD-10-CM

## 2014-02-21 LAB — POCT URINALYSIS DIP (DEVICE)
Bilirubin Urine: NEGATIVE
Glucose, UA: NEGATIVE mg/dL
Ketones, ur: NEGATIVE mg/dL
Nitrite: NEGATIVE
PH: 7 (ref 5.0–8.0)
PROTEIN: NEGATIVE mg/dL
SPECIFIC GRAVITY, URINE: 1.015 (ref 1.005–1.030)
Urobilinogen, UA: 1 mg/dL (ref 0.0–1.0)

## 2014-02-21 MED ORDER — NORETHINDRONE 0.35 MG PO TABS: 1.0000 | ORAL_TABLET | Freq: Every day | ORAL | Status: DC

## 2014-02-21 NOTE — Addendum Note (Signed)
Addended by: Shelly Coss on: 02/21/2014 01:51 PM   Modules accepted: Orders

## 2014-02-21 NOTE — Progress Notes (Signed)
  Subjective:     [name] is a 28 y.o. female who presents for a postpartum visit. She is 5 week postpartum following a spontaneous vaginal delivery. I have fully reviewed the prenatal and intrapartum course. The delivery was at term gestational weeks. Outcome: spontaneous vaginal delivery. Anesthesia: epidural. Postpartum course has been normal. Baby's course has been normal.. Baby is feeding by bottle - unknown. Bleeding staining only. Bowel function is normal. Bladder function is normal. Patient is not sexually active. Contraception method is none. Postpartum depression screening: negative. She initially started on POPs but lost them last week. She reports that she has not had sex since delivery. She declines Mirena and is considering Nexplanon. At the present time, she would like a refill of her Micronor. The following portions of the patient's history were reviewed and updated as appropriate: allergies, current medications, past family history, past medical history, past social history, past surgical history and problem list.  Review of Systems A comprehensive review of systems was negative.   Objective:    BP 144/68  Pulse 67  Temp(Src) 98.9 F (37.2 C) (Oral)  Wt 308 lb 6.4 oz (139.889 kg)  Breastfeeding? No  General:  alert   Breasts:  inspection negative, no nipple discharge or bleeding, no masses or nodularity palpable  Lungs: clear to auscultation bilaterally  Heart:  regular rate and rhythm, S1, S2 normal, no murmur, click, rub or gallop  Abdomen: soft, non-tender; bowel sounds normal; no masses,  no organomegaly   Vulva:  normal  Vagina: normal vagina  Cervix:  anteverted  Corpus: normal  Adnexa:  normal adnexa  Rectal Exam: Not performed.        Assessment:     6 week postpartum exam. Pap smear not done at today's visit.   Plan:    1. Contraception: POPs 2. Information given on Nexplanon 3. Follow up in: 1 year/prn sooner or as needed.

## 2014-02-21 NOTE — Progress Notes (Signed)
Patient here today for PP visit. States she was placed on OCP micronor because she was breastfeeding-- lost the pack and has not had any for 5 days. Would like new RX for OCP and states it does not have to be micronor as she is no longer breastfeeding. Denies having any sexual intercourse in the last two weeks.

## 2014-02-23 LAB — URINE CULTURE

## 2014-02-26 ENCOUNTER — Telehealth: Payer: Self-pay | Admitting: *Deleted

## 2014-02-26 DIAGNOSIS — N390 Urinary tract infection, site not specified: Secondary | ICD-10-CM

## 2014-02-26 MED ORDER — AMPICILLIN 500 MG PO CAPS: 500.0000 mg | ORAL_CAPSULE | Freq: Four times a day (QID) | ORAL | Status: DC

## 2014-02-26 NOTE — Telephone Encounter (Signed)
Message copied by Mitchell Heir on Mon Feb 26, 2014 10:14 AM ------      Message from: Emily Filbert      Created: Sat Feb 24, 2014  9:28 AM       Please give her a prescription for ampicillin 500 mg QID for 7 days for a UTI.      Thanks ------

## 2014-02-26 NOTE — Telephone Encounter (Signed)
Called patient and left message we are calling her with results. Rx sent to pharmacy.

## 2014-02-26 NOTE — Telephone Encounter (Signed)
Called patient back and informed her of results.  

## 2014-05-07 ENCOUNTER — Encounter: Payer: Self-pay | Admitting: Obstetrics & Gynecology

## 2014-07-02 ENCOUNTER — Encounter: Payer: Self-pay | Admitting: *Deleted

## 2015-01-27 ENCOUNTER — Other Ambulatory Visit: Payer: Self-pay | Admitting: Obstetrics & Gynecology

## 2015-03-18 ENCOUNTER — Other Ambulatory Visit: Payer: Self-pay | Admitting: *Deleted

## 2015-03-18 ENCOUNTER — Telehealth: Payer: Self-pay

## 2015-03-18 DIAGNOSIS — Z3041 Encounter for surveillance of contraceptive pills: Secondary | ICD-10-CM

## 2015-03-18 MED ORDER — NORETHINDRONE 0.35 MG PO TABS: 1.0000 | ORAL_TABLET | Freq: Every day | ORAL | Status: DC

## 2015-03-18 NOTE — Telephone Encounter (Addendum)
Patient called to request a refill on her birth control pills. She has an appointment scheduled in October.  1125  Rx refill sent to pt's pharmacy per standing order.  Called pt to notify her of this and heard message stating that her mailbox is full.  Pt is not activated on MyChart - will try to contact pt again later.  Diane Day RNC

## 2015-03-18 NOTE — Telephone Encounter (Signed)
Patient called in to front office and made annual appt for October. Request refill of birth control till appointment. Pt informed one refill will be sent to pharmacy.

## 2015-03-18 NOTE — Telephone Encounter (Signed)
Patient returned Thayer Headings call we informed her that her birth control rx was sent to her pharmacy for pick up. Patient understood.

## 2015-04-10 ENCOUNTER — Ambulatory Visit: Payer: Medicaid Other | Admitting: Obstetrics and Gynecology

## 2015-04-18 ENCOUNTER — Telehealth: Payer: Self-pay | Admitting: *Deleted

## 2015-04-18 DIAGNOSIS — Z3041 Encounter for surveillance of contraceptive pills: Secondary | ICD-10-CM

## 2015-04-18 MED ORDER — NORETHINDRONE 0.35 MG PO TABS: 1.0000 | ORAL_TABLET | Freq: Every day | ORAL | Status: DC

## 2015-04-18 NOTE — Telephone Encounter (Signed)
Pt called requesting refill on her birth control until she is seen next month. Rx sent to pharmacy.

## 2015-05-08 ENCOUNTER — Ambulatory Visit: Payer: Medicaid Other | Admitting: Obstetrics & Gynecology

## 2015-06-19 ENCOUNTER — Other Ambulatory Visit (INDEPENDENT_AMBULATORY_CARE_PROVIDER_SITE_OTHER): Payer: BLUE CROSS/BLUE SHIELD

## 2015-06-19 ENCOUNTER — Ambulatory Visit (INDEPENDENT_AMBULATORY_CARE_PROVIDER_SITE_OTHER): Payer: BLUE CROSS/BLUE SHIELD | Admitting: Internal Medicine

## 2015-06-19 ENCOUNTER — Telehealth: Payer: Self-pay | Admitting: General Practice

## 2015-06-19 ENCOUNTER — Encounter: Payer: Self-pay | Admitting: Internal Medicine

## 2015-06-19 ENCOUNTER — Telehealth: Payer: Self-pay | Admitting: Internal Medicine

## 2015-06-19 VITALS — BP 124/72 | HR 80 | Temp 98.8°F | Resp 16 | Ht 70.0 in | Wt 337.0 lb

## 2015-06-19 DIAGNOSIS — D509 Iron deficiency anemia, unspecified: Secondary | ICD-10-CM

## 2015-06-19 DIAGNOSIS — Z9884 Bariatric surgery status: Secondary | ICD-10-CM

## 2015-06-19 DIAGNOSIS — R739 Hyperglycemia, unspecified: Secondary | ICD-10-CM

## 2015-06-19 DIAGNOSIS — E559 Vitamin D deficiency, unspecified: Secondary | ICD-10-CM

## 2015-06-19 DIAGNOSIS — R21 Rash and other nonspecific skin eruption: Secondary | ICD-10-CM

## 2015-06-19 DIAGNOSIS — D519 Vitamin B12 deficiency anemia, unspecified: Secondary | ICD-10-CM

## 2015-06-19 DIAGNOSIS — G4730 Sleep apnea, unspecified: Secondary | ICD-10-CM | POA: Diagnosis not present

## 2015-06-19 LAB — COMPREHENSIVE METABOLIC PANEL
ALT: 14 U/L (ref 0–35)
AST: 14 U/L (ref 0–37)
Albumin: 4 g/dL (ref 3.5–5.2)
Alkaline Phosphatase: 128 U/L — ABNORMAL HIGH (ref 39–117)
BUN: 9 mg/dL (ref 6–23)
CALCIUM: 9.1 mg/dL (ref 8.4–10.5)
CHLORIDE: 106 meq/L (ref 96–112)
CO2: 25 meq/L (ref 19–32)
CREATININE: 0.76 mg/dL (ref 0.40–1.20)
GFR: 114.96 mL/min (ref >60.00)
Glucose, Bld: 73 mg/dL (ref 70–99)
POTASSIUM: 3.9 meq/L (ref 3.5–5.1)
SODIUM: 139 meq/L (ref 135–145)
Total Bilirubin: 0.2 mg/dL (ref 0.2–1.2)
Total Protein: 7.6 g/dL (ref 6.0–8.3)

## 2015-06-19 LAB — CBC WITH DIFFERENTIAL/PLATELET
BASOS PCT: 0.5 % (ref 0.0–3.0)
Basophils Absolute: 0 10*3/uL (ref 0.0–0.1)
EOS ABS: 0.7 10*3/uL (ref 0.0–0.7)
EOS PCT: 6.2 % — AB (ref 0.0–5.0)
HCT: 32.8 % — ABNORMAL LOW (ref 36.0–46.0)
Hemoglobin: 9.5 g/dL — ABNORMAL LOW (ref 12.0–15.0)
Lymphocytes Relative: 16.9 % (ref 12.0–46.0)
Lymphs Abs: 1.8 10*3/uL (ref 0.7–4.0)
MCHC: 29 g/dL — AB (ref 30.0–36.0)
MCV: 62.6 fl — ABNORMAL LOW (ref 78.0–100.0)
MONO ABS: 0.8 10*3/uL (ref 0.1–1.0)
Monocytes Relative: 7 % (ref 3.0–12.0)
NEUTROS ABS: 7.4 10*3/uL (ref 1.4–7.7)
Neutrophils Relative %: 69.4 % (ref 43.0–77.0)
PLATELETS: 361 10*3/uL (ref 150.0–400.0)
RBC: 5.24 Mil/uL — ABNORMAL HIGH (ref 3.87–5.11)
RDW: 19.9 % — AB (ref 11.5–15.5)
WBC: 10.7 10*3/uL — ABNORMAL HIGH (ref 4.0–10.5)

## 2015-06-19 LAB — FERRITIN: FERRITIN: 3.1 ng/mL — AB (ref 10.0–291.0)

## 2015-06-19 LAB — IBC PANEL
IRON: 6 ug/dL — AB (ref 42–145)
Saturation Ratios: 1.1 % — ABNORMAL LOW (ref 20.0–50.0)
TRANSFERRIN: 403 mg/dL — AB (ref 212.0–360.0)

## 2015-06-19 LAB — HEMOGLOBIN A1C: Hgb A1c MFr Bld: 5.2 % (ref 4.6–6.5)

## 2015-06-19 LAB — TSH: TSH: 1.54 u[IU]/mL (ref 0.35–4.50)

## 2015-06-19 LAB — HCG, QUANTITATIVE, PREGNANCY: QUANTITATIVE HCG: 0.16 m[IU]/mL

## 2015-06-19 LAB — FOLATE: FOLATE: 17.3 ng/mL (ref >5.9)

## 2015-06-19 LAB — VITAMIN D 25 HYDROXY (VIT D DEFICIENCY, FRACTURES): VITD: 6.37 ng/mL — ABNORMAL LOW (ref 30.00–100.00)

## 2015-06-19 LAB — VITAMIN B12: VITAMIN B 12: 237 pg/mL (ref 211–911)

## 2015-06-19 MED ORDER — PHENTERMINE HCL 37.5 MG PO CAPS
37.5000 mg | ORAL_CAPSULE | ORAL | Status: DC
Start: 2015-06-19 — End: 2015-08-28

## 2015-06-19 MED ORDER — CHOLECALCIFEROL 50 MCG (2000 UT) PO TABS: 1.0000 | ORAL_TABLET | Freq: Every day | ORAL | Status: DC

## 2015-06-19 NOTE — Telephone Encounter (Signed)
Pt request lab result, please call her back

## 2015-06-19 NOTE — Telephone Encounter (Signed)
Patient called and left message stating she has an annual exam scheduled for 1/25 but she will be out of her OCPs by then and would like a refill. Told patient she should still have refills at her pharmacy & I will call and check on that for her. Patient verbalized understanding & had no other questions. Called [facility] and patient still has 4 refills. They will get her prescription ready now.

## 2015-06-19 NOTE — Progress Notes (Signed)
Subjective:  Patient ID: [name], female    DOB: [birth year]  Age: 29 y.o. MRN: [id number]  CC: Rash and Anemia   HPI [name] presents for evaluation of rash, weight gain and anemia. She has gained about 30 pounds over the last year. She has a chronic rash on her arms and torso. She describes it as bumps that she scratches and then it resolves with dark spots, she has tried cortisone cream without much relief. She is status post bypass surgery and is not taking any vitamin supplements.  Outpatient Prescriptions Prior to Visit  Medication Sig Dispense Refill  . acetaminophen (TYLENOL) 500 MG tablet Take 1,000 mg by mouth daily as needed for moderate pain or headache.     . norethindrone (ERRIN) 0.35 MG tablet Take 1 tablet (0.35 mg total) by mouth daily. 28 tablet 3  . ampicillin (PRINCIPEN) 500 MG capsule Take 1 capsule (500 mg total) by mouth 4 (four) times daily. 28 capsule 0  . docusate sodium (COLACE) 100 MG capsule Take 200 mg by mouth daily as needed for mild constipation.    . ferrous sulfate (FERROUSUL) 325 (65 FE) MG tablet Take 1 tablet (325 mg total) by mouth 2 (two) times daily. 60 tablet 1  . ibuprofen (ADVIL,MOTRIN) 600 MG tablet Take 1 tablet (600 mg total) by mouth every 6 (six) hours. 30 tablet 0  . Prenat-FeCbn-FeAspGl-FA-Omega (OB COMPLETE PETITE) 35-5-1-200 MG CAPS TAKE 1 CAPSULE BY MOUTH DAILY. 30 capsule 3  . vitamin D, CHOLECALCIFEROL, 400 UNITS tablet Take 2 tablets (800 Units total) by mouth daily. 60 tablet 1   No facility-administered medications prior to visit.    ROS Review of Systems  Constitutional: Positive for fatigue and unexpected weight change. Negative for fever, chills, diaphoresis, activity change and appetite change.  HENT: Negative.   Eyes: Negative.   Respiratory: Positive for apnea. Negative for cough, choking, chest tightness, shortness of breath, wheezing and stridor.   Cardiovascular: Negative.  Negative for chest  pain, palpitations and leg swelling.  Gastrointestinal: Negative.  Negative for nausea, vomiting, abdominal pain, diarrhea, constipation and blood in stool.  Endocrine: Negative.   Genitourinary: Negative.  Negative for frequency, hematuria and difficulty urinating.  Musculoskeletal: Negative.  Negative for myalgias, back pain, joint swelling, arthralgias and neck pain.  Skin: Negative.  Negative for color change and rash.  Allergic/Immunologic: Negative.   Neurological: Negative.  Negative for dizziness, weakness and numbness.  Hematological: Negative.  Negative for adenopathy. Does not bruise/bleed easily.  Psychiatric/Behavioral: Negative.     Objective:  BP 124/72 mmHg  Pulse 80  Temp(Src) 98.8 F (37.1 C) (Oral)  Resp 16  Ht 5\' 10"  (1.778 m)  Wt 337 lb (152.862 kg)  BMI 48.35 kg/m2  SpO2 98%  LMP 05/18/2015  Breastfeeding? No  BP Readings from Last 3 Encounters:  06/19/15 124/72  02/21/14 144/68  01/23/14 133/75    Wt Readings from Last 3 Encounters:  06/19/15 337 lb (152.862 kg)  02/21/14 308 lb 6.4 oz (139.889 kg)  01/23/14 317 lb (143.79 kg)    Physical Exam  Constitutional: She is oriented to person, place, and time. No distress.  HENT:  Head: Normocephalic and atraumatic.  Mouth/Throat: Oropharynx is clear and moist. No oropharyngeal exudate.  Eyes: Conjunctivae are normal. Right eye exhibits no discharge. Left eye exhibits no discharge. No scleral icterus.  Neck: Normal range of motion. Neck supple. No JVD present. No tracheal deviation present. No thyromegaly present.  Cardiovascular: Normal rate,  regular rhythm, normal heart sounds and intact distal pulses.  Exam reveals no gallop and no friction rub.   No murmur heard. Pulmonary/Chest: Effort normal and breath sounds normal. No stridor. No respiratory distress. She has no wheezes. She has no rales. She exhibits no tenderness.  Abdominal: Soft. Bowel sounds are normal. She exhibits no distension and no  mass. There is no tenderness. There is no rebound and no guarding.  Musculoskeletal: Normal range of motion. She exhibits no edema or tenderness.  Lymphadenopathy:    She has no cervical adenopathy.  Neurological: She is oriented to person, place, and time.  Skin: Skin is warm and dry. Abrasion and rash noted. No bruising, no ecchymosis, no laceration, no petechiae and no purpura noted. Rash is maculopapular. Rash is not nodular. She is not diaphoretic. No erythema. No pallor.  There are a few small excoriations on her forearms and hyperpigmented macules scattered throughout her torso and upper extremities.  Vitals reviewed.   Lab Results  Component Value Date   WBC 10.7* 06/19/2015   HGB 9.5 Repeated and verified X2.* 06/19/2015   HCT 32.8 Repeated and verified X2.* 06/19/2015   PLT 361.0 06/19/2015   GLUCOSE 73 06/19/2015   CHOL 147 12/04/2011   TRIG 90.0 12/04/2011   HDL 42.70 12/04/2011   LDLCALC 86 12/04/2011   ALT 14 06/19/2015   AST 14 06/19/2015   NA 139 06/19/2015   K 3.9 06/19/2015   CL 106 06/19/2015   CREATININE 0.76 06/19/2015   BUN 9 06/19/2015   CO2 25 06/19/2015   TSH 1.54 06/19/2015   HGBA1C 5.2 06/19/2015    No results found.  Assessment & Plan:   Niesa was seen today for rash and anemia.  Diagnoses and all orders for this visit:  History of Roux-en-Y gastric bypass- will check her labs and vitamin levels to screen for vitamin deficiencies and complications from the surgery. -     CBC with Differential/Platelet; Future -     Comprehensive metabolic panel; Future -     IBC panel; Future -     Vitamin B12; Future -     Folate; Future -     Ferritin; Future -     Vitamin B6; Future -     Vitamin B1; Future -     Zinc; Future -     hCG, quantitative, pregnancy; Future -     Cholecalciferol 2000 UNITS TABS; Take 1 tablet (2,000 Units total) by mouth daily. -     FeAsp-B12-FA-C-DSS-SuccAc-Zn (FERIVA 21/7) 75-1 MG TABS; Take 1 tablet by mouth  daily. -     Cyanocobalamin (NASCOBAL) 500 MCG/0.1ML SOLN; Place 0.1 mLs (500 mcg total) into the nose once a week.  Iron deficiency anemia- she is anemic and iron deficient, will start iron replacement therapy. -     CBC with Differential/Platelet; Future -     hCG, quantitative, pregnancy; Future -     FeAsp-B12-FA-C-DSS-SuccAc-Zn (FERIVA 21/7) 75-1 MG TABS; Take 1 tablet by mouth daily.  Sleep apnea- this may be one of the causes for her weight gain, I have asked her to follow up to have this reevaluated. -     IBC panel; Future -     Ferritin; Future -     TSH; Future -     hCG, quantitative, pregnancy; Future -     Ambulatory referral to Pulmonology  Vitamin D deficiency- will restart vitamin D replacement therapy. -     VITAMIN D 25 Hydroxy (  Vit-D Deficiency, Fractures); Future -     hCG, quantitative, pregnancy; Future -     Cholecalciferol 2000 UNITS TABS; Take 1 tablet (2,000 Units total) by mouth daily.  Hyperglycemia- her blood sugars are well-controlled -     Hemoglobin A1c; Future -     hCG, quantitative, pregnancy; Future  Morbid obesity due to excess calories ([place])- in addition to lifestyle modifications she will take phentermine. Her pregnancy test was negative today. She is not currently on contraceptive. She sees her gynecologist first week of January and if she gets placed on a contraceptive regimen and well I will continue phentermine. -     phentermine 37.5 MG capsule; Take 1 capsule (37.5 mg total) by mouth every morning.  Rash and nonspecific skin eruption- at her request she is referred to a dermatologist -     Ambulatory referral to Dermatology  B12 deficiency anemia- she is anemic and has a borderline low vitamin B12 level. I've asked her to start Nascobal nasal spray 1 puff weekly. -     Cyanocobalamin (NASCOBAL) 500 MCG/0.1ML SOLN; Place 0.1 mLs (500 mcg total) into the nose once a week.   I have discontinued Ms. Keplar vitamin D (CHOLECALCIFEROL),  ferrous sulfate, OB COMPLETE PETITE, ibuprofen, docusate sodium, and ampicillin. I am also having her start on phentermine, Cholecalciferol, FERIVA 21/7, and Cyanocobalamin. Additionally, I am having her maintain her acetaminophen and norethindrone.  Meds ordered this encounter  Medications  . phentermine 37.5 MG capsule    Sig: Take 1 capsule (37.5 mg total) by mouth every morning.    Dispense:  30 capsule    Refill:  0  . Cholecalciferol 2000 UNITS TABS    Sig: Take 1 tablet (2,000 Units total) by mouth daily.    Dispense:  90 tablet    Refill:  3  . FeAsp-B12-FA-C-DSS-SuccAc-Zn (FERIVA 21/7) 75-1 MG TABS    Sig: Take 1 tablet by mouth daily.    Dispense:  28 tablet    Refill:  11  . Cyanocobalamin (NASCOBAL) 500 MCG/0.1ML SOLN    Sig: Place 0.1 mLs (500 mcg total) into the nose once a week.    Dispense:  1.3 mL    Refill:  11     Follow-up: Return in about 4 weeks (around 07/17/2015).  Scarlette Calico, MD

## 2015-06-19 NOTE — Patient Instructions (Signed)
Anemia, Nonspecific Anemia is a condition in which the concentration of red blood cells or hemoglobin in the blood is below normal. Hemoglobin is a substance in red blood cells that carries oxygen to the tissues of the body. Anemia results in not enough oxygen reaching these tissues.  CAUSES  Common causes of anemia include:   Excessive bleeding. Bleeding may be internal or external. This includes excessive bleeding from periods (in women) or from the intestine.   Poor nutrition.   Chronic kidney, thyroid, and liver disease.  Bone marrow disorders that decrease red blood cell production.  Cancer and treatments for cancer.  HIV, AIDS, and their treatments.  Spleen problems that increase red blood cell destruction.  Blood disorders.  Excess destruction of red blood cells due to infection, medicines, and autoimmune disorders. SIGNS AND SYMPTOMS   Minor weakness.   Dizziness.   Headache.  Palpitations.   Shortness of breath, especially with exercise.   Paleness.  Cold sensitivity.  Indigestion.  Nausea.  Difficulty sleeping.  Difficulty concentrating. Symptoms may occur suddenly or they may develop slowly.  DIAGNOSIS  Additional blood tests are often needed. These help your health care provider determine the best treatment. Your health care provider will check your stool for blood and look for other causes of blood loss.  TREATMENT  Treatment varies depending on the cause of the anemia. Treatment can include:   Supplements of iron, vitamin B12, or folic acid.   Hormone medicines.   A blood transfusion. This may be needed if blood loss is severe.   Hospitalization. This may be needed if there is significant continual blood loss.   Dietary changes.  Spleen removal. HOME CARE INSTRUCTIONS Keep all follow-up appointments. It often takes many weeks to correct anemia, and having your health care provider check on your condition and your response to  treatment is very important. SEEK IMMEDIATE MEDICAL CARE IF:   You develop extreme weakness, shortness of breath, or chest pain.   You become dizzy or have trouble concentrating.  You develop heavy vaginal bleeding.   You develop a rash.   You have bloody or black, tarry stools.   You faint.   You vomit up blood.   You vomit repeatedly.   You have abdominal pain.  You have a fever or persistent symptoms for more than 2-3 days.   You have a fever and your symptoms suddenly get worse.   You are dehydrated.  MAKE SURE YOU:  Understand these instructions.  Will watch your condition.  Will get help right away if you are not doing well or get worse.   This information is not intended to replace advice given to you by your health care provider. Make sure you discuss any questions you have with your health care provider.   Document Released: 07/30/2004 Document Revised: 02/22/2013 Document Reviewed: 12/16/2012 Elsevier Interactive Patient Education 2016 [employer] Inc[employer]  

## 2015-06-19 NOTE — Progress Notes (Signed)
Pre visit review using our clinic review tool, if applicable. No additional management support is needed unless otherwise documented below in the visit note. 

## 2015-06-20 DIAGNOSIS — E538 Deficiency of other specified B group vitamins: Secondary | ICD-10-CM

## 2015-06-20 HISTORY — DX: Deficiency of other specified B group vitamins: E53.8

## 2015-06-20 MED ORDER — CYANOCOBALAMIN 500 MCG/0.1ML NA SOLN: 0.1000 mL | NASAL | Status: DC

## 2015-06-20 MED ORDER — FERIVA 21/7 75-1 MG PO TABS: 1.0000 | ORAL_TABLET | Freq: Every day | ORAL | Status: DC

## 2015-06-20 NOTE — Telephone Encounter (Signed)
Pt called back regarding her lab results and I did let her know of Dr. Ronnald Ramp notes. She wants to be sure she can take all the medications that she was prescribed together. She did however receive a call from pulmonary and she is wondering why she was referred there. Can you please call her.

## 2015-06-20 NOTE — Telephone Encounter (Signed)
LMOVM. Referral was for sleep apnea.

## 2015-06-21 ENCOUNTER — Telehealth: Payer: Self-pay | Admitting: Internal Medicine

## 2015-06-21 NOTE — Telephone Encounter (Signed)
This was ordered to help her lose weight If she has sleep apnea and it is not adequately treated then she may not be able to lose the weight she wants to lose

## 2015-06-21 NOTE — Telephone Encounter (Signed)
Patient called re: labs results. Shared.   She also had questions on the sleep apnea referral. She states that this was not talked about in te visit, and she does not understand why the referral is needed. She is happy to go through with it, but has additional questions around it first. Please give her a call to discuss.

## 2015-06-22 LAB — VITAMIN B1: Vitamin B1 (Thiamine): 14 nmol/L (ref 8–30)

## 2015-06-23 LAB — VITAMIN B6: Vitamin B6: 2.9 ng/mL (ref 2.1–21.7)

## 2015-06-24 NOTE — Telephone Encounter (Signed)
Gabby, please speak to the patient on this

## 2015-06-25 ENCOUNTER — Encounter: Payer: Self-pay | Admitting: Internal Medicine

## 2015-06-25 ENCOUNTER — Other Ambulatory Visit: Payer: Self-pay | Admitting: Internal Medicine

## 2015-06-25 DIAGNOSIS — E60 Dietary zinc deficiency: Secondary | ICD-10-CM | POA: Insufficient documentation

## 2015-06-25 LAB — ZINC: ZINC: 54 ug/dL — AB (ref 60–130)

## 2015-06-25 MED ORDER — ZINC GLUCONATE 50 MG PO TABS: 50.0000 mg | ORAL_TABLET | Freq: Every day | ORAL | Status: DC

## 2015-06-26 ENCOUNTER — Encounter: Payer: Self-pay | Admitting: Internal Medicine

## 2015-06-26 NOTE — Telephone Encounter (Signed)
Pt informed of MD response to the reason for the sleep study.  Pt stated understanding and will call referral back to set up appt.

## 2015-07-31 ENCOUNTER — Ambulatory Visit (INDEPENDENT_AMBULATORY_CARE_PROVIDER_SITE_OTHER): Payer: BLUE CROSS/BLUE SHIELD | Admitting: Student

## 2015-07-31 ENCOUNTER — Encounter: Payer: Self-pay | Admitting: Student

## 2015-07-31 ENCOUNTER — Ambulatory Visit: Payer: BLUE CROSS/BLUE SHIELD | Admitting: Internal Medicine

## 2015-07-31 VITALS — BP 144/61 | HR 76 | Temp 98.3°F | Wt 330.6 lb

## 2015-07-31 DIAGNOSIS — Z01419 Encounter for gynecological examination (general) (routine) without abnormal findings: Secondary | ICD-10-CM

## 2015-07-31 DIAGNOSIS — Z3041 Encounter for surveillance of contraceptive pills: Secondary | ICD-10-CM

## 2015-07-31 MED ORDER — NORETHINDRONE 0.35 MG PO TABS: 1.0000 | ORAL_TABLET | Freq: Every day | ORAL | Status: DC

## 2015-07-31 NOTE — Progress Notes (Signed)
Subjective:     [name] is a 30 y.o. female here for a routine exam.  Current complaints: none. Gynecologic History Patient's last menstrual period was 07/29/2015 (exact date). Contraception: oral progesterone-only contraceptive Last Pap: 2015. Results were: normal Last mammogram: not done.  Obstetric History OB History  Gravida Para Term Preterm AB SAB TAB Ectopic Multiple Living  2 1 1  1 1    1     # Outcome Date GA Lbr Len/2nd Weight Sex Delivery Anes PTL Lv  2 Term 01/15/14 [redacted]w[redacted]d 12:40 / 02:34 7 lb 8 oz (3.402 kg) M Vag-Spont EPI  Y  1 SAB 04/24/08 [redacted]w[redacted]d       N       The following portions of the patient's history were reviewed and updated as appropriate: allergies, current medications, past family history, past medical history, past social history, past surgical history and problem list.  Review of Systems Constitutional: negative Gastrointestinal: negative Genitourinary:negative for abnormal menstrual periods, genital lesions and vaginal discharge and dysuria Integument/breast: negative for breast lump, breast tenderness, changed mole and nipple discharge    Objective:  BP 144/61 mmHg  Pulse 76  Temp(Src) 98.3 F (36.8 C)  Wt 330 lb 9.6 oz (149.959 kg)  LMP 07/29/2015 (Exact Date)   General appearance: alert, cooperative, no distress and morbidly obese Head: Normocephalic, without obvious abnormality, atraumatic Neck: no adenopathy, no carotid bruit, no JVD, supple, symmetrical, trachea midline and thyroid not enlarged, symmetric, no tenderness/mass/nodules Lungs: clear to auscultation bilaterally Breasts: normal appearance, no masses or tenderness, Inspection negative, No nipple retraction or dimpling, No nipple discharge or bleeding, No axillary or supraclavicular adenopathy, Normal to palpation without dominant masses Heart: regular rate and rhythm, S1, S2 normal, no murmur, click, rub or gallop Abdomen: soft, non-tender; bowel sounds normal; no  masses,  no organomegaly Pelvic: external genitalia normal, no adnexal masses or tenderness, no cervical motion tenderness and uterus normal size, shape, and consistency    Assessment:    Healthy female exam.   Refilled POP prescription   Plan:  1. Encounter for gynecological examination -pt to return in 1 year for pap  2. Encounter for surveillance of contraceptive pills  - norethindrone (ERRIN) 0.35 MG tablet; Take 1 tablet (0.35 mg total) by mouth daily.  Dispense: 28 tablet; Refill: 11     Contraception: oral progesterone-only contraceptive. Follow up in: 1 years.

## 2015-07-31 NOTE — Patient Instructions (Signed)
Contraception Choices Contraception (birth control) is the use of any methods or devices to prevent pregnancy. Below are some methods to help avoid pregnancy. HORMONAL METHODS   Contraceptive implant. This is a thin, plastic tube containing progesterone hormone. It does not contain estrogen hormone. Your health care provider inserts the tube in the inner part of the upper arm. The tube can remain in place for up to 3 years. After 3 years, the implant must be removed. The implant prevents the ovaries from releasing an egg (ovulation), thickens the cervical mucus to prevent sperm from entering the uterus, and thins the lining of the inside of the uterus.  Progesterone-only injections. These injections are given every 3 months by your health care provider to prevent pregnancy. This synthetic progesterone hormone stops the ovaries from releasing eggs. It also thickens cervical mucus and changes the uterine lining. This makes it harder for sperm to survive in the uterus.  Birth control pills. These pills contain estrogen and progesterone hormone. They work by preventing the ovaries from releasing eggs (ovulation). They also cause the cervical mucus to thicken, preventing the sperm from entering the uterus. Birth control pills are prescribed by a health care provider.Birth control pills can also be used to treat heavy periods.  Minipill. This type of birth control pill contains only the progesterone hormone. They are taken every day of each month and must be prescribed by your health care provider.  Birth control patch. The patch contains hormones similar to those in birth control pills. It must be changed once a week and is prescribed by a health care provider.  Vaginal ring. The ring contains hormones similar to those in birth control pills. It is left in the vagina for 3 weeks, removed for 1 week, and then a new one is put back in place. The patient must be comfortable inserting and removing the ring  from the vagina.A health care provider's prescription is necessary.  Emergency contraception. Emergency contraceptives prevent pregnancy after unprotected sexual intercourse. This pill can be taken right after sex or up to 5 days after unprotected sex. It is most effective the sooner you take the pills after having sexual intercourse. Most emergency contraceptive pills are available without a prescription. Check with your pharmacist. Do not use emergency contraception as your only form of birth control. BARRIER METHODS   Female condom. This is a thin sheath (latex or rubber) that is worn over the penis during sexual intercourse. It can be used with spermicide to increase effectiveness.  Female condom. This is a soft, loose-fitting sheath that is put into the vagina before sexual intercourse.  Diaphragm. This is a soft, latex, dome-shaped barrier that must be fitted by a health care provider. It is inserted into the vagina, along with a spermicidal jelly. It is inserted before intercourse. The diaphragm should be left in the vagina for 6 to 8 hours after intercourse.  Cervical cap. This is a round, soft, latex or plastic cup that fits over the cervix and must be fitted by a health care provider. The cap can be left in place for up to 48 hours after intercourse.  Sponge. This is a soft, circular piece of polyurethane foam. The sponge has spermicide in it. It is inserted into the vagina after wetting it and before sexual intercourse.  Spermicides. These are chemicals that kill or block sperm from entering the cervix and uterus. They come in the form of creams, jellies, suppositories, foam, or tablets. They do not require a   prescription. They are inserted into the vagina with an applicator before having sexual intercourse. The process must be repeated every time you have sexual intercourse. INTRAUTERINE CONTRACEPTION  Intrauterine device (IUD). This is a T-shaped device that is put in a woman's uterus  during a menstrual period to prevent pregnancy. There are 2 types:  Copper IUD. This type of IUD is wrapped in copper wire and is placed inside the uterus. Copper makes the uterus and fallopian tubes produce a fluid that kills sperm. It can stay in place for 10 years.  Hormone IUD. This type of IUD contains the hormone progestin (synthetic progesterone). The hormone thickens the cervical mucus and prevents sperm from entering the uterus, and it also thins the uterine lining to prevent implantation of a fertilized egg. The hormone can weaken or kill the sperm that get into the uterus. It can stay in place for 3-5 years, depending on which type of IUD is used. PERMANENT METHODS OF CONTRACEPTION  Female tubal ligation. This is when the woman's fallopian tubes are surgically sealed, tied, or blocked to prevent the egg from traveling to the uterus.  Hysteroscopic sterilization. This involves placing a small coil or insert into each fallopian tube. Your doctor uses a technique called hysteroscopy to do the procedure. The device causes scar tissue to form. This results in permanent blockage of the fallopian tubes, so the sperm cannot fertilize the egg. It takes about 3 months after the procedure for the tubes to become blocked. You must use another form of birth control for these 3 months.  Female sterilization. This is when the female has the tubes that carry sperm tied off (vasectomy).This blocks sperm from entering the vagina during sexual intercourse. After the procedure, the man can still ejaculate fluid (semen). NATURAL PLANNING METHODS  Natural family planning. This is not having sexual intercourse or using a barrier method (condom, diaphragm, cervical cap) on days the woman could become pregnant.  Calendar method. This is keeping track of the length of each menstrual cycle and identifying when you are fertile.  Ovulation method. This is avoiding sexual intercourse during ovulation.  Symptothermal  method. This is avoiding sexual intercourse during ovulation, using a thermometer and ovulation symptoms.  Post-ovulation method. This is timing sexual intercourse after you have ovulated. Regardless of which type or method of contraception you choose, it is important that you use condoms to protect against the transmission of sexually transmitted infections (STIs). Talk with your health care provider about which form of contraception is most appropriate for you.   This information is not intended to replace advice given to you by your health care provider. Make sure you discuss any questions you have with your health care provider.   Document Released: 06/22/2005 Document Revised: 06/27/2013 Document Reviewed: 12/15/2012 Elsevier Interactive Patient Education 2016 [employer] Inc.  

## 2015-07-31 NOTE — Progress Notes (Deleted)
   Subjective:    Patient ID: [name], female    DOB: 27-Jan-1986, 30 y.o.   MRN: [id number]  HPI    Review of Systems     Objective:   Physical Exam        Assessment & Plan:    Continue birth control No vd, vb, std concerns, pelvic pain No breast changes Exam normal Bimanual normal Breast exam normal

## 2015-08-28 ENCOUNTER — Ambulatory Visit: Payer: BLUE CROSS/BLUE SHIELD | Admitting: Internal Medicine

## 2015-08-28 ENCOUNTER — Encounter: Payer: Self-pay | Admitting: Internal Medicine

## 2015-08-28 ENCOUNTER — Ambulatory Visit (INDEPENDENT_AMBULATORY_CARE_PROVIDER_SITE_OTHER): Payer: BLUE CROSS/BLUE SHIELD | Admitting: Internal Medicine

## 2015-08-28 DIAGNOSIS — E60 Dietary zinc deficiency: Secondary | ICD-10-CM | POA: Diagnosis not present

## 2015-08-28 DIAGNOSIS — D519 Vitamin B12 deficiency anemia, unspecified: Secondary | ICD-10-CM

## 2015-08-28 DIAGNOSIS — D509 Iron deficiency anemia, unspecified: Secondary | ICD-10-CM

## 2015-08-28 MED ORDER — PHENTERMINE HCL 37.5 MG PO CAPS: 37.5000 mg | ORAL_CAPSULE | ORAL | Status: DC

## 2015-08-28 NOTE — Patient Instructions (Signed)
Iron Deficiency Anemia, Adult Anemia is a condition in which there are less red blood cells or hemoglobin in the blood than normal. Hemoglobin is the part of red blood cells that carries oxygen. Iron deficiency anemia is anemia caused by too little iron. It is the most common type of anemia. It may leave you tired and short of breath. CAUSES   Lack of iron in the diet.  Poor absorption of iron, as seen with intestinal disorders.  Intestinal bleeding.  Heavy periods. SIGNS AND SYMPTOMS  Mild anemia may not be noticeable. Symptoms may include:  Fatigue.  Headache.  Pale skin.  Weakness.  Tiredness.  Shortness of breath.  Dizziness.  Cold hands and feet.  Fast or irregular heartbeat. DIAGNOSIS  Diagnosis requires a thorough evaluation and physical exam by your health care provider. Blood tests are generally used to confirm iron deficiency anemia. Additional tests may be done to find the underlying cause of your anemia. These may include:  Testing for blood in the stool (fecal occult blood test).  A procedure to see inside the colon and rectum (colonoscopy).  A procedure to see inside the esophagus and stomach (endoscopy). TREATMENT  Iron deficiency anemia is treated by correcting the cause of the deficiency. Treatment may involve:  Adding iron-rich foods to your diet.  Taking iron supplements. Pregnant or breastfeeding women need to take extra iron because their normal diet usually does not provide the required amount.  Taking vitamins. Vitamin C improves the absorption of iron. Your health care provider may recommend that you take your iron tablets with a glass of orange juice or vitamin C supplement.  Medicines to make heavy menstrual flow lighter.  Surgery. HOME CARE INSTRUCTIONS   Take iron as directed by your health care provider.  If you cannot tolerate taking iron supplements by mouth, talk to your health care provider about taking them through a vein  (intravenously) or an injection into a muscle.  For the best iron absorption, iron supplements should be taken on an empty stomach. If you cannot tolerate them on an empty stomach, you may need to take them with food.  Do not drink milk or take antacids at the same time as your iron supplements. Milk and antacids may interfere with the absorption of iron.  Iron supplements can cause constipation. Make sure to include fiber in your diet to prevent constipation. A stool softener may also be recommended.  Take vitamins as directed by your health care provider.  Eat a diet rich in iron. Foods high in iron include liver, lean beef, whole-grain bread, eggs, dried fruit, and dark green leafy vegetables. SEEK IMMEDIATE MEDICAL CARE IF:   You faint. If this happens, do not drive. Call your local emergency services (911 in [place] if no other help is available.  You have chest pain.  You feel nauseous or vomit.  You have severe or increased shortness of breath with activity.  You feel weak.  You have a rapid heartbeat.  You have unexplained sweating.  You become light-headed when getting up from a chair or bed. MAKE SURE YOU:   Understand these instructions.  Will watch your condition.  Will get help right away if you are not doing well or get worse.   This information is not intended to replace advice given to you by your health care provider. Make sure you discuss any questions you have with your health care provider.   Document Released: 06/19/2000 Document Revised: 07/13/2014 Document Reviewed: 02/27/2013 Elsevier   Interactive Patient Education 2016 [employer]vier Inc.  

## 2015-08-28 NOTE — Progress Notes (Signed)
Subjective:  Patient ID: [name], female    DOB: October 02, 1985  Age: 30 y.o. MRN: [id number]  CC: Anemia   HPI [name] presents for follow-up on anemia and to discuss obesity. She wants to take another course of phentermine, since I last saw her she has been placed on oral contraceptives ; concerns about taking phentermine in becoming pregnant have been addressed.  She also needs a follow-up on anemia, she is status post gastric bypass and has a history of zinc deficiency, B12 deficiency, and iron deficiency. She struggles with compliance with her vitamin supplements. She has no symptoms of anemia such as fatigue or shortness of breath.  Outpatient Prescriptions Prior to Visit  Medication Sig Dispense Refill  . acetaminophen (TYLENOL) 500 MG tablet Take 1,000 mg by mouth daily as needed for moderate pain or headache. Reported on 07/31/2015    . FeAsp-B12-FA-C-DSS-SuccAc-Zn (FERIVA 21/7) 75-1 MG TABS Take 1 tablet by mouth daily. 28 tablet 11  . norethindrone (ERRIN) 0.35 MG tablet Take 1 tablet (0.35 mg total) by mouth daily. 28 tablet 11  . phentermine 37.5 MG capsule Take 1 capsule (37.5 mg total) by mouth every morning. 30 capsule 0  . Cholecalciferol 2000 UNITS TABS Take 1 tablet (2,000 Units total) by mouth daily. (Patient not taking: Reported on 07/31/2015) 90 tablet 3  . Cyanocobalamin (NASCOBAL) 500 MCG/0.1ML SOLN Place 0.1 mLs (500 mcg total) into the nose once a week. (Patient not taking: Reported on 07/31/2015) 1.3 mL 11  . zinc gluconate 50 MG tablet Take 1 tablet (50 mg total) by mouth daily. (Patient not taking: Reported on 07/31/2015) 90 tablet 3   No facility-administered medications prior to visit.    ROS Review of Systems  Constitutional: Negative.  Negative for fever, chills, diaphoresis, appetite change, fatigue and unexpected weight change.  HENT: Negative.  Negative for nosebleeds and sinus pressure.   Eyes: Negative.   Respiratory: Negative.   Negative for cough, choking, chest tightness, shortness of breath and stridor.   Cardiovascular: Negative.  Negative for chest pain, palpitations and leg swelling.  Gastrointestinal: Negative.  Negative for nausea, vomiting, abdominal pain, diarrhea, constipation and blood in stool.  Genitourinary: Negative.   Musculoskeletal: Negative.  Negative for arthralgias.  Skin: Negative.  Negative for color change, pallor and rash.  Neurological: Negative.  Negative for dizziness, tremors, weakness, light-headedness, numbness and headaches.  Hematological: Negative.  Negative for adenopathy. Does not bruise/bleed easily.  Psychiatric/Behavioral: Negative.     Objective:  BP 140/78 mmHg  Pulse 89  Temp(Src) 98.9 F (37.2 C) (Oral)  Resp 16  Ht 5\' 10"  (1.778 m)  Wt 340 lb (154.223 kg)  BMI 48.78 kg/m2  SpO2 97%  LMP 08/27/2015  Breastfeeding? No  BP Readings from Last 3 Encounters:  08/28/15 140/78  07/31/15 144/61  06/19/15 124/72    Wt Readings from Last 3 Encounters:  08/28/15 340 lb (154.223 kg)  07/31/15 330 lb 9.6 oz (149.959 kg)  06/19/15 337 lb (152.862 kg)    Physical Exam  Constitutional: She is oriented to person, place, and time. She appears well-developed and well-nourished. No distress.  HENT:  Head: Normocephalic and atraumatic.  Mouth/Throat: Oropharynx is clear and moist. No oropharyngeal exudate.  Eyes: Conjunctivae are normal. Right eye exhibits no discharge. Left eye exhibits no discharge. No scleral icterus.  Neck: Normal range of motion. Neck supple. No JVD present. No tracheal deviation present. No thyromegaly present.  Cardiovascular: Normal rate, regular rhythm, normal heart sounds  and intact distal pulses.  Exam reveals no gallop and no friction rub.   No murmur heard. Pulmonary/Chest: Effort normal and breath sounds normal. No stridor. No respiratory distress. She has no wheezes. She has no rales. She exhibits no tenderness.  Abdominal: Soft. Bowel  sounds are normal. She exhibits no distension and no mass. There is no tenderness. There is no rebound and no guarding.  Musculoskeletal: Normal range of motion. She exhibits no edema or tenderness.  Lymphadenopathy:    She has no cervical adenopathy.  Neurological: She is oriented to person, place, and time.  Skin: Skin is warm and dry. No rash noted. She is not diaphoretic. No erythema. No pallor.  Vitals reviewed.   Lab Results  Component Value Date   WBC 10.7* 06/19/2015   HGB 9.5 Repeated and verified X2.* 06/19/2015   HCT 32.8 Repeated and verified X2.* 06/19/2015   PLT 361.0 06/19/2015   GLUCOSE 73 06/19/2015   CHOL 147 12/04/2011   TRIG 90.0 12/04/2011   HDL 42.70 12/04/2011   LDLCALC 86 12/04/2011   ALT 14 06/19/2015   AST 14 06/19/2015   NA 139 06/19/2015   K 3.9 06/19/2015   CL 106 06/19/2015   CREATININE 0.76 06/19/2015   BUN 9 06/19/2015   CO2 25 06/19/2015   TSH 1.54 06/19/2015   HGBA1C 5.2 06/19/2015    No results found.  Assessment & Plan:   Shakerra was seen today for anemia.  Diagnoses and all orders for this visit:  Morbid obesity due to excess calories ([place])- in addition to lifestyle modifications she will take a 3-4 month course of phentermine to help her control her caloric intake and lose weight. -     Discontinue: phentermine 37.5 MG capsule; Take 1 capsule (37.5 mg total) by mouth every morning. -     phentermine 37.5 MG capsule; Take 1 capsule (37.5 mg total) by mouth every morning.  B12 deficiency anemia- will recheck her CBC to see if this is improved. -     CBC with Differential/Platelet; Future  Chronic zinc deficiency- I'll recheck her CBC and her zinc level to see if she has been compliant with the supplement. -     CBC with Differential/Platelet; Future -     Zinc; Future  Iron deficiency anemia- I will recheck her CBC and an iron level, I have asked her to be more compliant with replacement therapy. -     CBC with  Differential/Platelet; Future -     IBC panel; Future -     Ferritin; Future   I am having Ms. Saffo maintain her acetaminophen, Cholecalciferol, FERIVA 21/7, Cyanocobalamin, zinc gluconate, norethindrone, and phentermine.  Meds ordered this encounter  Medications  . DISCONTD: phentermine 37.5 MG capsule    Sig: Take 1 capsule (37.5 mg total) by mouth every morning.    Dispense:  30 capsule    Refill:  0  . phentermine 37.5 MG capsule    Sig: Take 1 capsule (37.5 mg total) by mouth every morning.    Dispense:  30 capsule    Refill:  3     Follow-up: Return in about 4 months (around 12/26/2015).  Scarlette Calico, MD

## 2015-08-28 NOTE — Progress Notes (Signed)
Pre visit review using our clinic review tool, if applicable. No additional management support is needed unless otherwise documented below in the visit note. 

## 2015-09-01 IMAGING — US US OB FOLLOW-UP
1 series · 12 of 28 positions shown · non-contrast
Comparison: none

[Series 1: us ob follow up · 30 acquisitions, 12 frames shown]
[im 2/30]
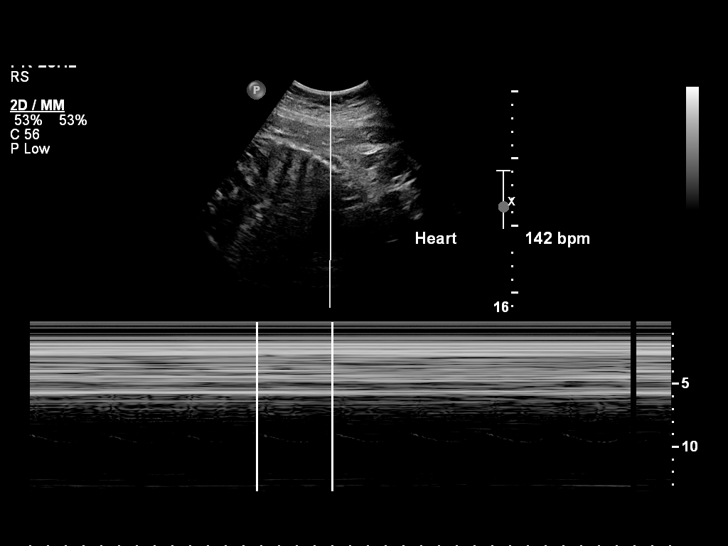
[im 4/30]
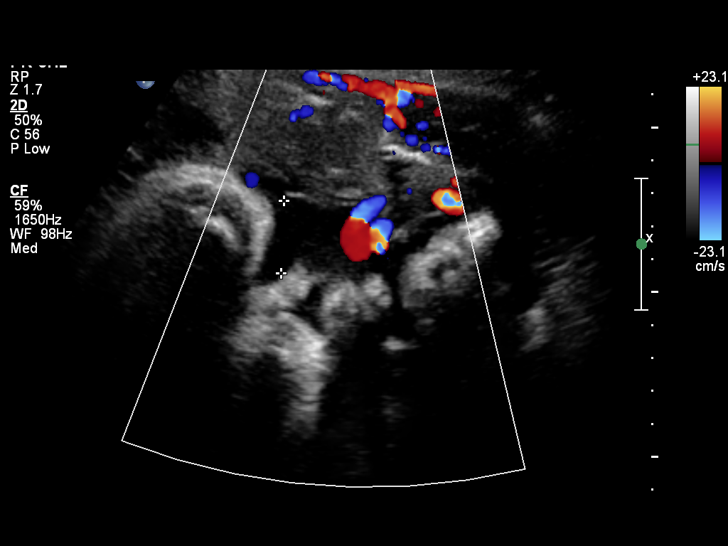
[im 6/30]
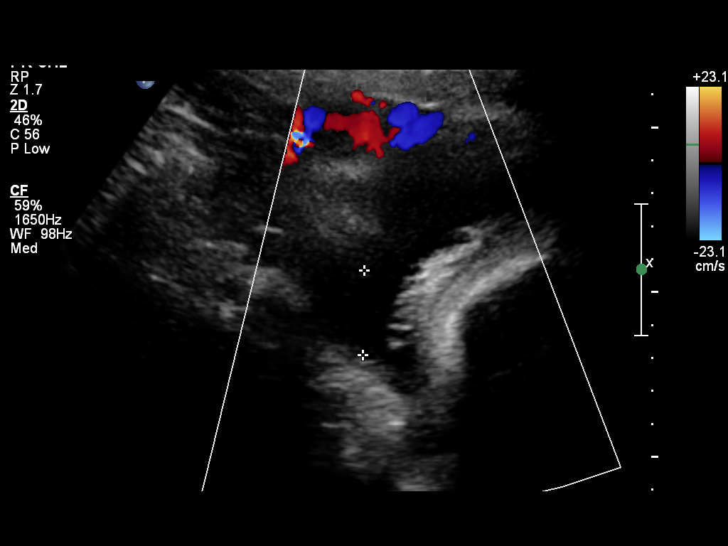
[im 9/30]
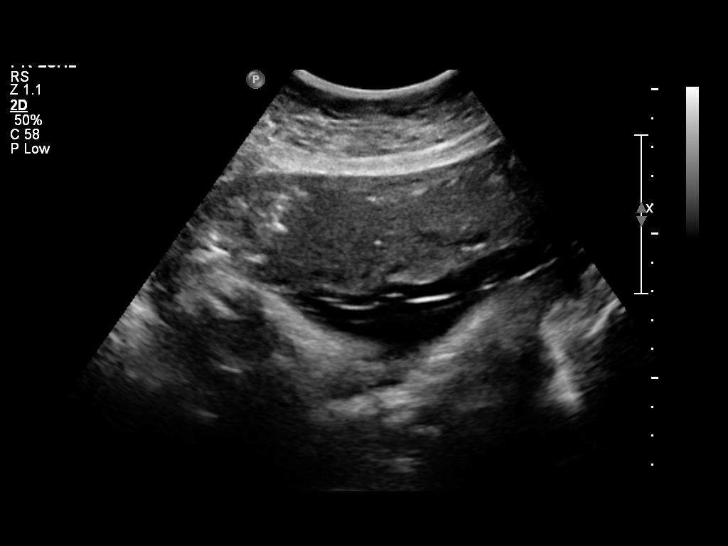
[im 11/30]
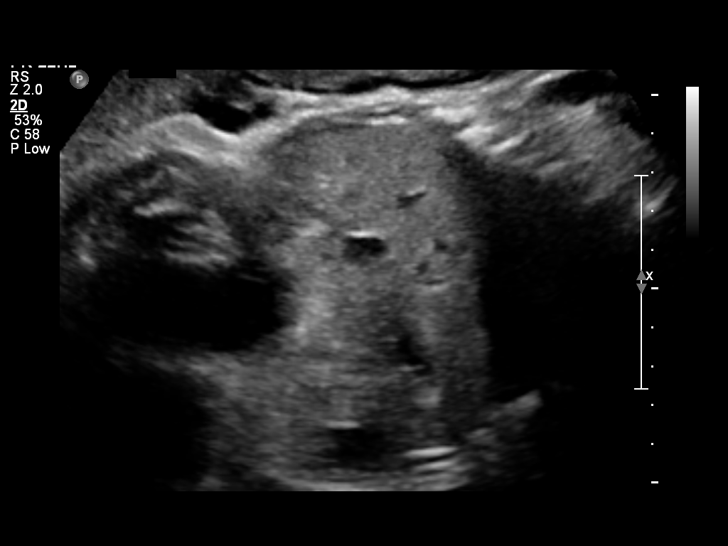
[im 13/30]
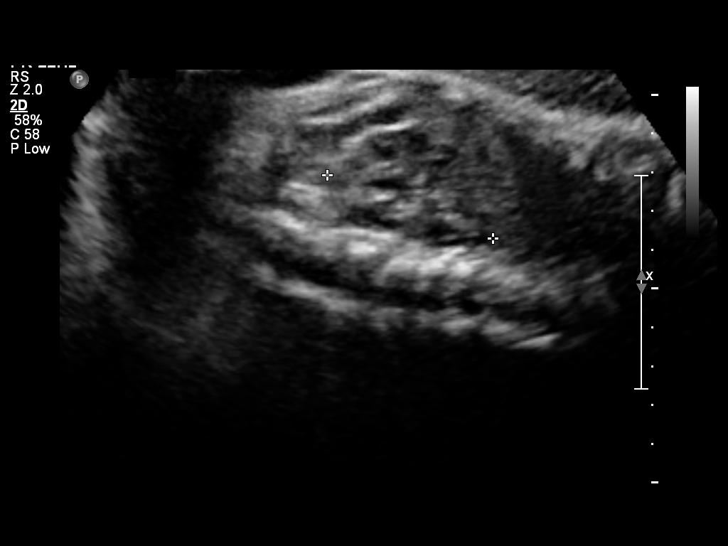
[im 17/30]
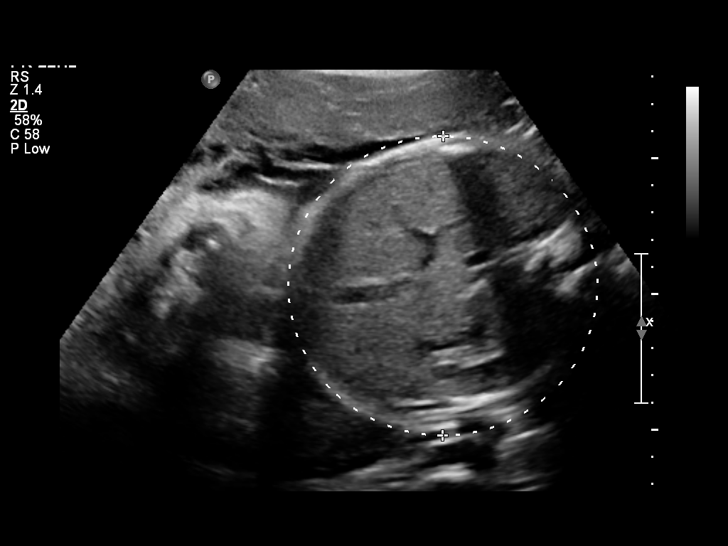
[im 19/30]
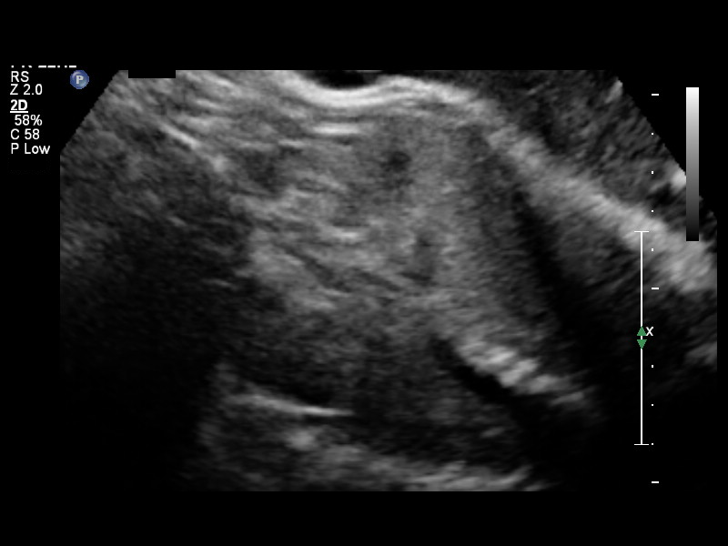
[im 21/30]
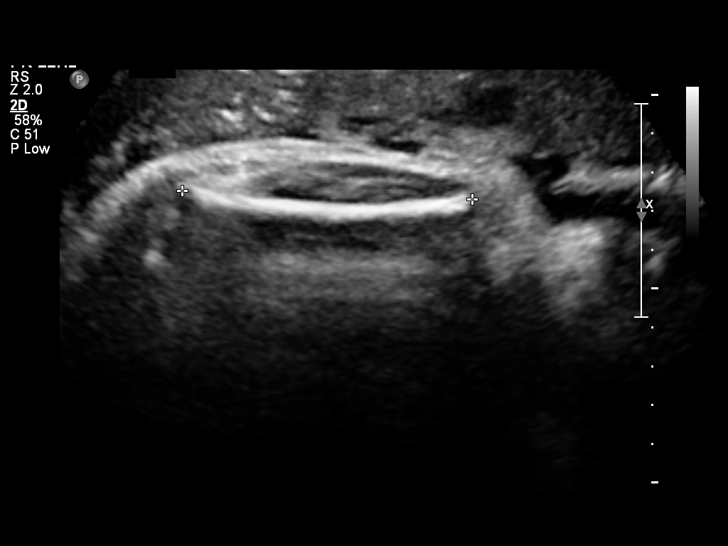
[im 24/30]
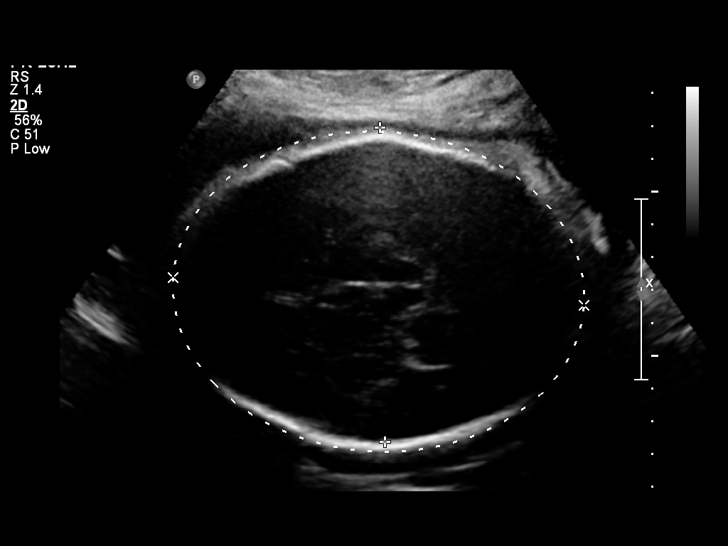
[im 26/30]
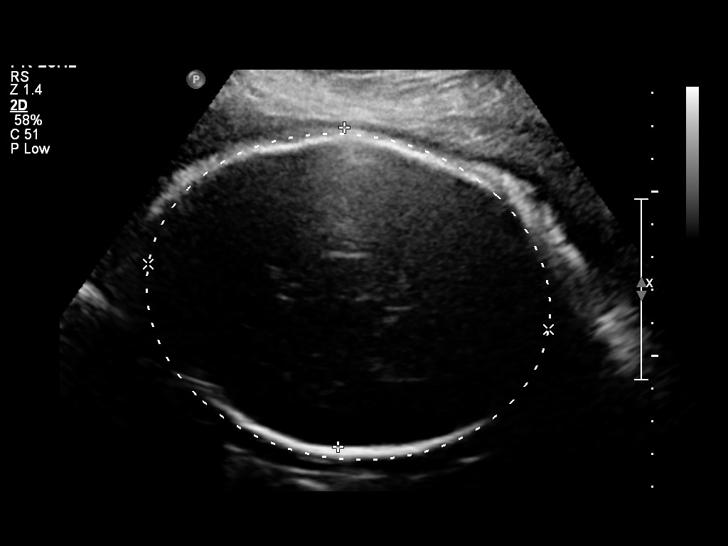
[im 28/30]
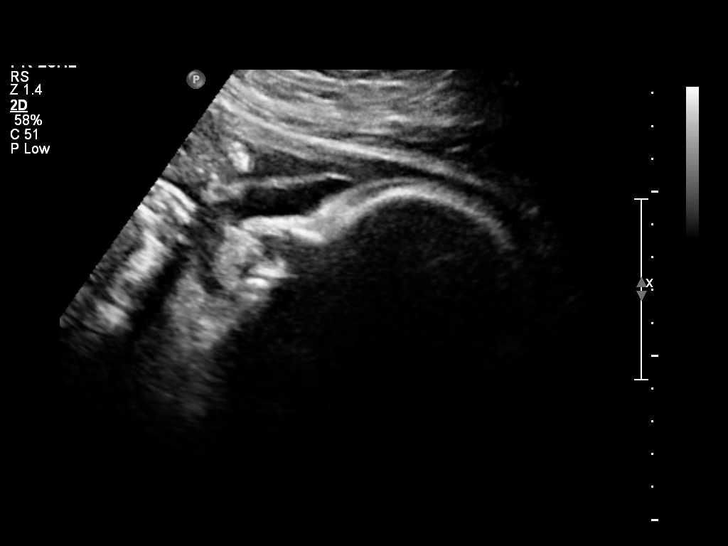

[12 of 28 positions shown; findings below may reference images not displayed]

OBSTETRICS REPORT
                      (Signed Final 01/09/2014 [DATE])

             JUMPER

Service(s) Provided

 US OB FOLLOW UP                                       76816.1
Indications

 Maternal morbid obesity
 Size greater than dates (Large for gestational [AGE]
Fetal Evaluation

 Num Of Fetuses:    1
 Fetal Heart Rate:  142                          bpm
 Cardiac Activity:  Observed
 Presentation:      Cephalic
 Placenta:          Anterior, above cervical os
 P. Cord            Previously Visualized
 Insertion:

 Amniotic Fluid
 AFI FV:      Subjectively within normal limits
 AFI Sum:     13.11   cm       52  %Tile     Larg Pckt:    4.38  cm
 RUQ:   3.98    cm   RLQ:    2.19   cm    LUQ:   2.56    cm   LLQ:    4.38   cm
Biometry

 BPD:     95.5  mm     G. Age:  39w 0d                CI:        74.98   70 - 86
                                                      FL/HC:      20.8   20.6 -

 HC:     349.9  mm     G. Age:  40w 5d       78  %    HC/AC:      0.98   0.87 -

 AC:     355.7  mm     G. Age:  39w 3d       84  %    FL/BPD:     76.2   71 - 87
 FL:      72.8  mm     G. Age:  37w 2d       19  %    FL/AC:      20.5   20 - 24

 Est. FW:    3137  gm      8 lb 2 oz     87  %
Gestational Age

 LMP:           38w 6d        Date:  04/12/13                 EDD:   01/17/14
 U/S Today:     39w 1d                                        EDD:   01/15/14
 Best:          38w 6d     Det. By:  LMP  (04/12/13)          EDD:   01/17/14
Anatomy

 Cranium:          Previously seen        Aortic Arch:      Not well visualized
 Fetal Cavum:      Previously seen        Ductal Arch:      Previously seen
 Ventricles:       Appears normal         Diaphragm:        Previously seen
 Choroid Plexus:   Previously seen        Stomach:          Appears normal, left
                                                            sided
 Cerebellum:       Not well visualized    Abdomen:          Appears normal
 Posterior Fossa:  Not well visualized    Abdominal Wall:   Previously seen
 Nuchal Fold:      Not applicable (>20    Cord Vessels:     Previously seen
                   wks GA)
 Face:             Orbits previously      Kidneys:          Appear normal
                   seen
 Lips:             Previously seen        Bladder:          Appears normal
 Heart:            Previously seen        Spine:            Previously seen
 RVOT:             Not well visualized    Lower             Previously seen
                                          Extremities:
 LVOT:             Not well visualized    Upper             Previously seen
                                          Extremities:

 Other:  Male gender previously visualized.  Nasal bone previously visualized.
         Technically difficult due to maternal habitus and fetal position.
Cervix Uterus Adnexa

 Cervix:       Not visualized (advanced GA >22wks)
Impression

 Single IUP at 67w5d
 S>D, obesity
 The estimated fetal weight today is at the 87th %tile (3137 g)
 Limited views of the fetal anatomy obtained due to late
 gestational age
 Normal amniotic fluid volume
Recommendations

 Follow-up ultrasounds as clinically indicated.

## 2015-09-11 LAB — HM PAP SMEAR

## 2015-09-28 ENCOUNTER — Ambulatory Visit (INDEPENDENT_AMBULATORY_CARE_PROVIDER_SITE_OTHER): Payer: BLUE CROSS/BLUE SHIELD | Admitting: Internal Medicine

## 2015-09-28 ENCOUNTER — Encounter: Payer: Self-pay | Admitting: Emergency Medicine

## 2015-09-28 ENCOUNTER — Encounter: Payer: Self-pay | Admitting: Internal Medicine

## 2015-09-28 VITALS — BP 134/82 | HR 85 | Temp 97.5°F | Resp 16 | Wt 327.0 lb

## 2015-09-28 DIAGNOSIS — J069 Acute upper respiratory infection, unspecified: Secondary | ICD-10-CM | POA: Diagnosis not present

## 2015-09-28 LAB — POCT INFLUENZA A/B
INFLUENZA A, POC: NEGATIVE
Influenza B, POC: NEGATIVE

## 2015-09-28 NOTE — Progress Notes (Signed)
Subjective:    Patient ID: [name], female    DOB: Nov 13, 1985, 30 y.o.   MRN: [id number]  HPI She is here for an acute visit for cold symptoms.   Her symptoms started about 6 days ago.   Two days ago her symptoms got worse. She feels hot/cold, sneezing, mild nasal congestion, rhinorrhea, mild sore throat, dry cough, sob, fatigue, mild nausea, headaches and body aches.     She tried taking airborne and [employer], [employer].  Her son was diagnosed with the flu 4 days ago and is taking tamiflu.     Medications and allergies reviewed with patient and updated if appropriate.  Patient Active Problem List   Diagnosis Date Noted  . Chronic zinc deficiency 06/25/2015  . B12 deficiency anemia 06/20/2015  . Hyperglycemia 06/19/2015  . Rash and nonspecific skin eruption 06/19/2015  . Sleep apnea 07/27/2013  . Vitamin D deficiency 06/20/2013  . 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 Iron deficiency anemia 12/04/2011  . Severe obesity (BMI >= 40) ([place]) 12/04/2011    Current Outpatient Prescriptions on File Prior to Visit  Medication Sig Dispense Refill  . acetaminophen (TYLENOL) 500 MG tablet Take 1,000 mg by mouth daily as needed for moderate pain or headache. Reported on 07/31/2015    . Cholecalciferol 2000 UNITS TABS Take 1 tablet (2,000 Units total) by mouth daily. 90 tablet 3  . Cyanocobalamin (NASCOBAL) 500 MCG/0.1ML SOLN Place 0.1 mLs (500 mcg total) into the nose once a week. 1.3 mL 11  . FeAsp-B12-FA-C-DSS-SuccAc-Zn (FERIVA 21/7) 75-1 MG TABS Take 1 tablet by mouth daily. 28 tablet 11  . norethindrone (ERRIN) 0.35 MG tablet Take 1 tablet (0.35 mg total) by mouth daily. 28 tablet 11  . phentermine 37.5 MG capsule Take 1 capsule (37.5 mg total) by mouth every morning. 30 capsule 3  . zinc gluconate 50 MG tablet Take 1 tablet (50 mg total) by mouth daily. 90 tablet 3   No current facility-administered medications on file prior to visit.    Past Medical History    Diagnosis Date  . Anemia   . Morbid obesity ([place])   . Sleep apnea     hx of  . Blood transfusion without reported diagnosis 2005    Past Surgical History  Procedure Laterality Date  . Breath tek h pylori  02/09/2012    Procedure: BREATH TEK H PYLORI;  Surgeon: Pedro Earls, MD;  Location: [facility];  Service: General;  Laterality: N/A;  . Gastric bypass  08/29/2012  . Hernia repair  2014    Social History   Social History  . Marital Status: Single    Spouse Name: N/A  . Number of Children: N/A  . Years of Education: N/A   Social History Main Topics  . Smoking status: Never Smoker   . Smokeless tobacco: Never Used  . Alcohol Use: No  . Drug Use: No  . Sexual Activity: Yes    Birth Control/ Protection: Pill   Other Topics Concern  . None   Social History Narrative    Family History  Problem Relation Age of Onset  . Hypertension Father   . Alcohol abuse Neg Hx   . Arthritis Neg Hx   . Cancer Neg Hx   . Early death Neg Hx   . Heart disease Neg Hx   . Hyperlipidemia Neg Hx   . Kidney disease Neg Hx   . Stroke Neg Hx   . Hypertension Mother  Review of Systems  Constitutional: Positive for fever (subjective) and appetite change.  HENT: Positive for congestion (mild), rhinorrhea and sore throat (mild). Negative for ear pain and postnasal drip.   Respiratory: Positive for cough (dry) and shortness of breath (mild). Negative for wheezing.   Gastrointestinal: Positive for nausea (mild, sometimes). Negative for abdominal pain and diarrhea.  Musculoskeletal: Positive for myalgias.  Neurological: Positive for headaches. Negative for dizziness and light-headedness.       Objective:   Filed Vitals:   09/28/15 0945  BP: 134/82  Pulse: 85  Temp: 97.5 F (36.4 C)  Resp: 16   Filed Weights   09/28/15 0945  Weight: 327 lb (148.326 kg)   Body mass index is 46.92 kg/(m^2).   Physical Exam GENERAL APPEARANCE: Appears stated age, well appearing,  NAD EYES: conjunctiva clear, no icterus HEENT: bilateral tympanic membranes and ear canals normal, oropharynx with mild erythema, no thyromegaly, trachea midline, no cervical or supraclavicular lymphadenopathy LUNGS: Clear to auscultation without wheeze or crackles, unlabored breathing, good air entry bilaterally HEART: Normal S1,S2 without murmurs EXTREMITIES: Without clubbing, cyanosis, or edema       Assessment & Plan:   URI Likely viral, possible flu, but is out of the window for tamiflu.  She is not that ill appearing and no need for treatment Rapid flu- she wants to know for work Symptomatic treatment only -discussed otc meds she can take Call if no improvement

## 2015-09-28 NOTE — Progress Notes (Signed)
Pre visit review using our clinic review tool, if applicable. No additional management support is needed unless otherwise documented below in the visit note. 

## 2015-09-28 NOTE — Patient Instructions (Signed)
   If your symptoms worsen or fail to improve, please contact our office for further instruction, or in case of emergency go directly to the emergency room at the closest medical facility.   General Recommendations:    Please drink plenty of fluids.  Get plenty of rest   Sleep in humidified air  Use saline nasal sprays  Netti pot  OTC Medications:  Decongestants - helps relieve congestion   Flonase (generic fluticasone) or Nasacort (generic triamcinolone) - please make sure to use the "cross-over" technique at a 45 degree angle towards the opposite eye as opposed to straight up the nasal passageway.   Sudafed (generic pseudoephedrine - Note this is the one that is available behind the pharmacy counter); Products with phenylephrine (-PE) may also be used but is often not as effective as pseudoephedrine.   If you have HIGH BLOOD PRESSURE - Coricidin HBP; AVOID any product that is -D as this contains pseudoephedrine which may increase your blood pressure.  Afrin (oxymetazoline) every 6-8 hours for up to 3 days.  Allergies - helps relieve runny nose, itchy eyes and sneezing   Claritin (generic loratidine), Allegra (fexofenidine), or Zyrtec (generic cyrterizine) for runny nose. These medications should not cause drowsiness.  Note - Benadryl (generic diphenhydramine) may be used however may cause drowsiness  Cough -   Delsym or Robitussin (generic dextromethorphan)  Expectorants - helps loosen mucus to ease removal   Mucinex (generic guaifenesin) as directed on the package.  Headaches / General Aches   Tylenol (generic acetaminophen) - DO NOT EXCEED 3 grams (3,000 mg) in a 24 hour time period  Advil/Motrin (generic ibuprofen)  Sore Throat -   Salt water gargle   Chloraseptic (generic benzocaine) spray or lozenges / Sucrets (generic dyclonine)  

## 2015-12-27 ENCOUNTER — Ambulatory Visit (INDEPENDENT_AMBULATORY_CARE_PROVIDER_SITE_OTHER): Payer: BLUE CROSS/BLUE SHIELD | Admitting: Internal Medicine

## 2015-12-27 ENCOUNTER — Encounter: Payer: Self-pay | Admitting: Internal Medicine

## 2015-12-27 VITALS — BP 138/88 | HR 80 | Temp 97.2°F | Ht 70.0 in | Wt 327.1 lb

## 2015-12-27 DIAGNOSIS — L239 Allergic contact dermatitis, unspecified cause: Secondary | ICD-10-CM

## 2015-12-27 DIAGNOSIS — L200 Besnier's prurigo: Secondary | ICD-10-CM | POA: Diagnosis not present

## 2015-12-27 MED ORDER — PREDNISONE 10 MG PO TABS: ORAL_TABLET | ORAL | Status: DC

## 2015-12-27 MED ORDER — CEPHALEXIN 500 MG PO CAPS: 500.0000 mg | ORAL_CAPSULE | Freq: Four times a day (QID) | ORAL | Status: DC

## 2015-12-27 NOTE — Progress Notes (Signed)
Pre visit review using our clinic review tool, if applicable. No additional management support is needed unless otherwise documented below in the visit note. 

## 2015-12-27 NOTE — Patient Instructions (Signed)
Take the prednisone and Keflex (antibiotics) as prescribed  Use  OTC hydrocortisone cream as needed to help with itching  Seek medical attention immediately if severe eye swelling, lip or tongue swelling  Call here or your PCP if not improving in the next few days.

## 2015-12-27 NOTE — Progress Notes (Signed)
Subjective:    Patient ID: [name], female    DOB: Oct 07, 1985, 30 y.o.   MRN: [id number]  DOS:  12/27/2015 Type of visit - description : Acute visit, PCP Dr. Ronnald Ramp Interval history: Symptoms started 5 days ago with irritation, itching and dryness at the facial skin. Also on and off swelling and redness under the left eye.  Denies any lip or tongue swelling No recent dental procedure or dental pain. She was using a new moisturizing and a new makeup removal x 3 weeks, she d/c them  Few days ago w/ the onset of symptoms.  No fever chills.  Review of Systems  See above Past Medical History  Diagnosis Date  . Anemia   . Morbid obesity ([place])   . Sleep apnea     hx of  . Blood transfusion without reported diagnosis 2005    Past Surgical History  Procedure Laterality Date  . Breath tek h pylori  02/09/2012    Procedure: BREATH TEK H PYLORI;  Surgeon: Pedro Earls, MD;  Location: [facility];  Service: General;  Laterality: N/A;  . Gastric bypass  08/29/2012  . Hernia repair  2014    Social History   Social History  . Marital Status: Single    Spouse Name: N/A  . Number of Children: N/A  . Years of Education: N/A   Occupational History  . Not on file.   Social History Main Topics  . Smoking status: Never Smoker   . Smokeless tobacco: Never Used  . Alcohol Use: No  . Drug Use: No  . Sexual Activity: Yes    Birth Control/ Protection: Pill   Other Topics Concern  . Not on file   Social History Narrative        Medication List       This list is accurate as of: 12/27/15 11:59 PM.  Always use your most recent med list.               acetaminophen 500 MG tablet  Commonly known as:  TYLENOL  Take 1,000 mg by mouth daily as needed for moderate pain or headache. Reported on 07/31/2015     cephALEXin 500 MG capsule  Commonly known as:  KEFLEX  Take 1 capsule (500 mg total) by mouth 4 (four) times daily.     norethindrone 0.35 MG tablet    Commonly known as:  ERRIN  Take 1 tablet (0.35 mg total) by mouth daily.     phentermine 37.5 MG capsule  Take 1 capsule (37.5 mg total) by mouth every morning.     predniSONE 10 MG tablet  Commonly known as:  DELTASONE  4 tablets x 2 days, 3 tabs x 2 days, 2 tabs x 2 days, 1 tab x 2 days           Objective:   Physical Exam  HENT:  Head:     BP 138/88 mmHg  Pulse 80  Temp(Src) 97.2 F (36.2 C) (Oral)  Ht 5\' 10"  (1.778 m)  Wt 327 lb 2 oz (148.383 kg)  BMI 46.94 kg/m2  SpO2 97%  LMP  (LMP Unknown)  Breastfeeding? No General:   Well developed, well nourished . NAD.  HEENT:  Normocephalic ,  Atraumatic, EOMI, Lips and tongue normal. Throat symmetric. Neurologic:  alert & oriented X3.  Speech normal, gait appropriate for age and unassisted Psych--  Cognition and judgment appear intact.  Cooperative with normal attention span and concentration.  Behavior  appropriate. No anxious or depressed appearing.      Assessment & Plan:    Dermatitis: Suspect local allergic reaction to the new makeup products she has been using for 3 weeks. Less likely a infection. She is already avoiding the new moisturizer and cleaning wipes Plan: Keflex, prednisone, call if no better. OTC hydrocortisone as needed

## 2016-08-03 ENCOUNTER — Other Ambulatory Visit: Payer: Self-pay | Admitting: Student

## 2016-08-03 DIAGNOSIS — Z3041 Encounter for surveillance of contraceptive pills: Secondary | ICD-10-CM

## 2016-08-25 ENCOUNTER — Ambulatory Visit: Payer: BLUE CROSS/BLUE SHIELD | Admitting: Student

## 2016-09-01 ENCOUNTER — Ambulatory Visit: Payer: BLUE CROSS/BLUE SHIELD | Admitting: Internal Medicine

## 2016-09-08 ENCOUNTER — Other Ambulatory Visit (INDEPENDENT_AMBULATORY_CARE_PROVIDER_SITE_OTHER): Payer: BLUE CROSS/BLUE SHIELD

## 2016-09-08 ENCOUNTER — Encounter: Payer: Self-pay | Admitting: Internal Medicine

## 2016-09-08 ENCOUNTER — Ambulatory Visit (INDEPENDENT_AMBULATORY_CARE_PROVIDER_SITE_OTHER): Payer: BLUE CROSS/BLUE SHIELD | Admitting: Internal Medicine

## 2016-09-08 VITALS — BP 146/92 | HR 70 | Temp 98.9°F | Resp 16 | Ht 70.0 in | Wt 336.5 lb

## 2016-09-08 DIAGNOSIS — E559 Vitamin D deficiency, unspecified: Secondary | ICD-10-CM

## 2016-09-08 DIAGNOSIS — N62 Hypertrophy of breast: Secondary | ICD-10-CM

## 2016-09-08 DIAGNOSIS — D513 Other dietary vitamin B12 deficiency anemia: Secondary | ICD-10-CM

## 2016-09-08 DIAGNOSIS — E60 Dietary zinc deficiency: Secondary | ICD-10-CM

## 2016-09-08 DIAGNOSIS — D500 Iron deficiency anemia secondary to blood loss (chronic): Secondary | ICD-10-CM | POA: Diagnosis not present

## 2016-09-08 DIAGNOSIS — Z9884 Bariatric surgery status: Secondary | ICD-10-CM | POA: Diagnosis not present

## 2016-09-08 LAB — IBC PANEL
Iron: 16 ug/dL — ABNORMAL LOW (ref 42–145)
SATURATION RATIOS: 2.9 % — AB (ref 20.0–50.0)
TRANSFERRIN: 388 mg/dL — AB (ref 212.0–360.0)

## 2016-09-08 LAB — CBC WITH DIFFERENTIAL/PLATELET
BASOS PCT: 0.5 % (ref 0.0–3.0)
Basophils Absolute: 0 10*3/uL (ref 0.0–0.1)
EOS PCT: 9.6 % — AB (ref 0.0–5.0)
Eosinophils Absolute: 0.6 10*3/uL (ref 0.0–0.7)
Hemoglobin: 7.5 g/dL — CL (ref 12.0–15.0)
LYMPHS ABS: 1.4 10*3/uL (ref 0.7–4.0)
LYMPHS PCT: 21.7 % (ref 12.0–46.0)
MCHC: 29.1 g/dL — AB (ref 30.0–36.0)
MCV: 59.3 fl — AB (ref 78.0–100.0)
MONOS PCT: 6.7 % (ref 3.0–12.0)
Monocytes Absolute: 0.4 10*3/uL (ref 0.1–1.0)
NEUTROS ABS: 3.8 10*3/uL (ref 1.4–7.7)
NEUTROS PCT: 61.5 % (ref 43.0–77.0)
PLATELETS: 238 10*3/uL (ref 150.0–400.0)
RBC: 4.36 Mil/uL (ref 3.87–5.11)
RDW: 20.4 % — AB (ref 11.5–15.5)
WBC: 6.3 10*3/uL (ref 4.0–10.5)

## 2016-09-08 LAB — COMPREHENSIVE METABOLIC PANEL
ALT: 15 U/L (ref 0–35)
AST: 15 U/L (ref 0–37)
Albumin: 3.7 g/dL (ref 3.5–5.2)
Alkaline Phosphatase: 107 U/L (ref 39–117)
BILIRUBIN TOTAL: 0.3 mg/dL (ref 0.2–1.2)
BUN: 8 mg/dL (ref 6–23)
CALCIUM: 8.7 mg/dL (ref 8.4–10.5)
CHLORIDE: 107 meq/L (ref 96–112)
CO2: 28 meq/L (ref 19–32)
Creatinine, Ser: 0.61 mg/dL (ref 0.40–1.20)
GFR: 146.96 mL/min (ref >60.00)
GLUCOSE: 84 mg/dL (ref 70–99)
POTASSIUM: 3.7 meq/L (ref 3.5–5.1)
Sodium: 140 mEq/L (ref 135–145)
Total Protein: 6.7 g/dL (ref 6.0–8.3)

## 2016-09-08 LAB — VITAMIN D 25 HYDROXY (VIT D DEFICIENCY, FRACTURES): VITD: 5 ng/mL — ABNORMAL LOW (ref 30.00–100.00)

## 2016-09-08 LAB — FOLATE: FOLATE: 23.2 ng/mL (ref >5.9)

## 2016-09-08 LAB — VITAMIN B12: VITAMIN B 12: 213 pg/mL (ref 211–911)

## 2016-09-08 LAB — FERRITIN: Ferritin: 2.6 ng/mL — ABNORMAL LOW (ref 10.0–291.0)

## 2016-09-08 MED ORDER — CHOLECALCIFEROL 50 MCG (2000 UT) PO TABS: 1.0000 | ORAL_TABLET | Freq: Every day | ORAL | 3 refills | Status: DC

## 2016-09-08 MED ORDER — IRON POLYSACCH CMPLX-B12-FA 150-0.025-1 MG PO CAPS: 2.0000 | ORAL_CAPSULE | Freq: Every day | ORAL | 1 refills | Status: DC

## 2016-09-08 MED ORDER — PHENTERMINE HCL 37.5 MG PO CAPS: 37.5000 mg | ORAL_CAPSULE | ORAL | 2 refills | Status: DC

## 2016-09-08 NOTE — Progress Notes (Signed)
Subjective:  Patient ID: [name], female    DOB: Aug 06, 1985  Age: 31 y.o. MRN: [id number]  CC: Breast Problem (Breast reduction [name] ) and Anemia   HPI [name] presents for anemia f/up - she has had a gastric bypass previously. She is not currently taking any supplements. She denies paresthesias, fatigue, shortness of breath, abdominal pain, or blood in her stools. She complains of weight gain and wants to take a diet pill. She complains that her large breasts make her feel uncomfortable and she wants to be referred to a Psychiatric nurse.  Outpatient Medications Prior to Visit  Medication Sig Dispense Refill  . acetaminophen (TYLENOL) 500 MG tablet Take 1,000 mg by mouth daily as needed for moderate pain or headache. Reported on 07/31/2015    . cephALEXin (KEFLEX) 500 MG capsule Take 1 capsule (500 mg total) by mouth 4 (four) times daily. 20 capsule 0  . norethindrone (MICRONOR,CAMILA,ERRIN) 0.35 MG tablet TAKE ONE TABLET BY MOUTH ONCE DAILY 28 tablet 3  . phentermine 37.5 MG capsule Take 1 capsule (37.5 mg total) by mouth every morning. 30 capsule 3  . predniSONE (DELTASONE) 10 MG tablet 4 tablets x 2 days, 3 tabs x 2 days, 2 tabs x 2 days, 1 tab x 2 days 20 tablet 0   No facility-administered medications prior to visit.     ROS Review of Systems  Constitutional: Positive for unexpected weight change. Negative for activity change, appetite change, chills, diaphoresis and fatigue.  HENT: Negative.   Eyes: Negative for visual disturbance.  Respiratory: Negative for cough, chest tightness, shortness of breath, wheezing and stridor.   Cardiovascular: Negative for chest pain and leg swelling.  Gastrointestinal: Negative for abdominal pain, anal bleeding, blood in stool, constipation, diarrhea, nausea and vomiting.  Endocrine: Negative.   Genitourinary: Negative.  Negative for difficulty urinating, dysuria, frequency and hematuria.  Musculoskeletal:  Negative.  Negative for back pain, myalgias and neck pain.  Skin: Negative.   Allergic/Immunologic: Negative.   Neurological: Negative.  Negative for dizziness, weakness, light-headedness and numbness.  Hematological: Negative.  Negative for adenopathy. Does not bruise/bleed easily.  Psychiatric/Behavioral: Negative.     Objective:  BP (!) 146/92   Pulse 70   Temp 98.9 F (37.2 C) (Oral)   Resp 16   Ht 5\' 10"  (1.778 m)   Wt (!) 336 lb 8 oz (152.6 kg)   LMP 09/05/2016   BMI 48.28 kg/m   BP Readings from Last 3 Encounters:  09/08/16 (!) 146/92  12/27/15 138/88  09/28/15 134/82    Wt Readings from Last 3 Encounters:  09/08/16 (!) 336 lb 8 oz (152.6 kg)  12/27/15 (!) 327 lb 2 oz (148.4 kg)  09/28/15 (!) 327 lb (148.3 kg)    Physical Exam  Constitutional: She is oriented to person, place, and time. No distress.  HENT:  Mouth/Throat: Oropharynx is clear and moist. No oropharyngeal exudate.  Eyes: Conjunctivae are normal. Right eye exhibits no discharge. Left eye exhibits no discharge. No scleral icterus.  Neck: Normal range of motion. Neck supple. No JVD present. No tracheal deviation present. No thyromegaly present.  Cardiovascular: Normal rate, regular rhythm, normal heart sounds and intact distal pulses.  Exam reveals no gallop and no friction rub.   No murmur heard. Pulmonary/Chest: Effort normal and breath sounds normal. No stridor. No respiratory distress. She has no wheezes. She has no rales. She exhibits no tenderness.  Abdominal: Soft. Bowel sounds are normal. She exhibits no distension  and no mass. There is no tenderness. There is no rebound and no guarding.  Musculoskeletal: Normal range of motion. She exhibits no edema, tenderness or deformity.  Lymphadenopathy:    She has no cervical adenopathy.  Neurological: She is oriented to person, place, and time.  Skin: Skin is warm and dry. No rash noted. She is not diaphoretic. No erythema. No pallor.  Vitals  reviewed.   Lab Results  Component Value Date   WBC 6.3 09/08/2016   HGB 7.5 Repeated and verified X2. (LL) 09/08/2016   HCT 25.9 Repeated and verified X2. (L) 09/08/2016   PLT 238.0 09/08/2016   GLUCOSE 84 09/08/2016   CHOL 147 12/04/2011   TRIG 90.0 12/04/2011   HDL 42.70 12/04/2011   LDLCALC 86 12/04/2011   ALT 15 09/08/2016   AST 15 09/08/2016   NA 140 09/08/2016   K 3.7 09/08/2016   CL 107 09/08/2016   CREATININE 0.61 09/08/2016   BUN 8 09/08/2016   CO2 28 09/08/2016   TSH 1.54 06/19/2015   HGBA1C 5.2 06/19/2015    No results found.  Assessment & Plan:   [name] was seen today for breast problem and anemia.  Diagnoses and all orders for this visit:  Other dietary vitamin B12 deficiency anemia- she is severely anemic, I've asked her to restart parenteral B12 replacement therapy. -     CBC with Differential/Platelet; Future -     Vitamin B12; Future -     Folate; Future  Iron deficiency anemia due to chronic blood loss- he is severely anemic, I've asked her to restart iron replacement therapy. -     CBC with Differential/Platelet; Future -     IBC panel; Future -     Ferritin; Future -     Iron Polysacch Cmplx-B12-FA 150-0.025-1 MG CAPS; Take 2 tablets by mouth daily.  Vitamin D deficiency- she has a very low vitamin D level, I've asked her to start vitamin D replacement therapy. -     VITAMIN D 25 Hydroxy (Vit-D Deficiency, Fractures); Future -     Cholecalciferol 2000 units TABS; Take 1 tablet (2,000 Units total) by mouth daily.  Chronic zinc deficiency- I will recheck her zinc level and treat if indicated -     CBC with Differential/Platelet; Future -     Comprehensive metabolic panel; Future -     Zinc; Future  History of Roux-en-Y gastric bypass -     VITAMIN D 25 Hydroxy (Vit-D Deficiency, Fractures); Future -     Vitamin B6; Future -     Vitamin B1; Future  Severe obesity (BMI >= 40) (HCC)  Morbid obesity due to excess calories (HCC) -      phentermine 37.5 MG capsule; Take 1 capsule (37.5 mg total) by mouth every morning.  Large breasts -     Ambulatory referral to Plastic Surgery   I have discontinued [name] acetaminophen, predniSONE, cephALEXin, and norethindrone. I am also having her start on Iron Polysacch Cmplx-B12-FA and Cholecalciferol. Additionally, I am having her maintain her phentermine.  Meds ordered this encounter  Medications  . phentermine 37.5 MG capsule    Sig: Take 1 capsule (37.5 mg total) by mouth every morning.    Dispense:  30 capsule    Refill:  2  . Iron Polysacch Cmplx-B12-FA 150-0.025-1 MG CAPS    Sig: Take 2 tablets by mouth daily.    Dispense:  180 each    Refill:  1  . Cholecalciferol 2000 units TABS  Sig: Take 1 tablet (2,000 Units total) by mouth daily.    Dispense:  90 tablet    Refill:  3     Follow-up: Return in about 3 months (around 12/09/2016).  Scarlette Calico, MD

## 2016-09-08 NOTE — Progress Notes (Signed)
Pre-visit discussion using our clinic review tool. No additional management support is needed unless otherwise documented below in the visit note.  

## 2016-09-08 NOTE — Patient Instructions (Signed)
Iron Deficiency Anemia, Adult Iron deficiency anemia is a condition in which the concentration of red blood cells or hemoglobin in the blood is below normal because of too little iron. Hemoglobin is a substance in red blood cells that carries oxygen to the body's tissues. When the concentration of red blood cells or hemoglobin is too low, not enough oxygen reaches these tissues. Iron deficiency anemia is usually long-lasting (chronic) and it develops over time. It may or may not cause symptoms. It is a common type of anemia. What are the causes? This condition may be caused by:  Not enough iron in the diet.  Blood loss caused by bleeding in the intestine.  Blood loss from a gastrointestinal condition like Crohn disease.  Frequent blood draws, such as from blood donation.  Abnormal absorption in the gut.  Heavy menstrual periods in women.  Cancers of the gastrointestinal system, such as colon cancer. What are the signs or symptoms? Symptoms of this condition may include:  Fatigue.  Headache.  Pale skin, lips, and nail beds.  Poor appetite.  Weakness.  Shortness of breath.  Dizziness.  Cold hands and feet.  Fast or irregular heartbeat.  Irritability. This is more common in severe anemia.  Rapid breathing. This is more common in severe anemia. Mild anemia may not cause any symptoms. How is this diagnosed? This condition is diagnosed based on:  Your medical history.  A physical exam.  Blood tests. You may have additional tests to find the underlying cause of your anemia, such as:  Testing for blood in the stool (fecal occult blood test).  A procedure to see inside your colon and rectum (colonoscopy).  A procedure to see inside your esophagus and stomach (endoscopy).  A test in which cells are removed from bone marrow (bone marrow aspiration) or fluid is removed from the bone marrow to be examined (biopsy). This is rarely needed. How is this treated? This  condition is treated by correcting the cause of your iron deficiency. Treatment may involve:  Adding iron-rich foods to your diet.  Taking iron supplements. If you are pregnant or breastfeeding, you may need to take extra iron because your normal diet usually does not provide the amount of iron that you need.  Increasing vitamin C intake. Vitamin C helps your body absorb iron. Your health care provider may recommend that you take iron supplements along with a glass of orange juice or a vitamin C supplement.  Medicines to make heavy menstrual flow lighter.  Surgery. You may need repeat blood tests to determine whether treatment is working. Depending on the underlying cause, the anemia should be corrected within 2 months of starting treatment. If the treatment does not seem to be working, you may need more testing. Follow these instructions at home: Medicines  Take over-the-counter and prescription medicines only as told by your health care provider. This includes iron supplements and vitamins.  If you cannot tolerate taking iron supplements by mouth, talk with your health care provider about taking them through a vein (intravenously) or an injection into a muscle.  For the best iron absorption, you should take iron supplements when your stomach is empty. If you cannot tolerate them on an empty stomach, you may need to take them with food.  Do not drink milk or take antacids at the same time as your iron supplements. Milk and antacids may interfere with iron absorption.  Iron supplements can cause constipation. To prevent constipation, include fiber in your diet as told   by your health care provider. A stool softener may also be recommended. Eating and drinking  Talk with your health care provider before changing your diet. He or she may recommend that you eat foods that contain a lot of iron, such as:  Liver.  Low-fat (lean) beef.  Breads and cereals that have iron added to them (are  fortified).  Eggs.  Dried fruit.  Dark green, leafy vegetables.  To help your body use the iron from iron-rich foods, eat those foods at the same time as fresh fruits and vegetables that are high in vitamin C. Foods that are high in vitamin C include:  Oranges.  Peppers.  Tomatoes.  Mangoes.  Drinkenoughfluid to keep your urine clear or pale yellow. General instructions  Return to your normal activities as told by your health care provider. Ask your health care provider what activities are safe for you.  Practice good hygiene. Anemia can make you more prone to illness and infection.  Keep all follow-up visits as told by your health care provider. This is important. Contact a health care provider if:  You feel nauseous or you vomit.  You feel weak.  You have unexplained sweating.  You develop symptoms of constipation, such as:  Having fewer than three bowel movements a week.  Straining to have a bowel movement.  Having stools that are hard, dry, or larger than normal.  Feeling full or bloated.  Pain in the lower abdomen.  Not feeling relief after having a bowel movement. Get help right away if:  You faint. If this happens, do not drive yourself to the hospital. Call your local emergency services (911 in the [place]  You have chest pain.  You have shortness of breath that:  Is severe.  Gets worse with physical activity.  You have a rapid heartbeat.  You become light-headed when getting up from a sitting or lying down position. This information is not intended to replace advice given to you by your health care provider. Make sure you discuss any questions you have with your health care provider. Document Released: 06/19/2000 Document Revised: 03/11/2016 Document Reviewed: 03/11/2016 Elsevier Interactive Patient Education  2017 [employer]  

## 2016-09-09 ENCOUNTER — Ambulatory Visit: Payer: BLUE CROSS/BLUE SHIELD | Admitting: Internal Medicine

## 2016-09-10 LAB — ZINC: ZINC: 78 ug/dL (ref 60–130)

## 2016-09-13 LAB — VITAMIN B1: Vitamin B1 (Thiamine): 12 nmol/L (ref 8–30)

## 2016-09-13 LAB — VITAMIN B6: VITAMIN B6: 6.3 ng/mL (ref 2.1–21.7)

## 2016-10-28 ENCOUNTER — Encounter: Payer: Self-pay | Admitting: Internal Medicine

## 2016-10-28 ENCOUNTER — Ambulatory Visit (INDEPENDENT_AMBULATORY_CARE_PROVIDER_SITE_OTHER): Payer: BLUE CROSS/BLUE SHIELD | Admitting: Internal Medicine

## 2016-10-28 VITALS — BP 132/84 | HR 86 | Temp 100.5°F | Resp 18 | Wt 338.0 lb

## 2016-10-28 DIAGNOSIS — D513 Other dietary vitamin B12 deficiency anemia: Secondary | ICD-10-CM | POA: Diagnosis not present

## 2016-10-28 DIAGNOSIS — D500 Iron deficiency anemia secondary to blood loss (chronic): Secondary | ICD-10-CM

## 2016-10-28 DIAGNOSIS — B349 Viral infection, unspecified: Secondary | ICD-10-CM | POA: Diagnosis not present

## 2016-10-28 DIAGNOSIS — J029 Acute pharyngitis, unspecified: Secondary | ICD-10-CM | POA: Diagnosis not present

## 2016-10-28 LAB — POCT RAPID STREP A (OFFICE): RAPID STREP A SCREEN: NEGATIVE

## 2016-10-28 LAB — POCT INFLUENZA A/B
INFLUENZA A, POC: NEGATIVE
INFLUENZA B, POC: NEGATIVE

## 2016-10-28 MED ORDER — IRON POLYSACCH CMPLX-B12-FA 150-0.025-1 MG PO CAPS: 2.0000 | ORAL_CAPSULE | Freq: Every day | ORAL | 1 refills | Status: DC

## 2016-10-28 MED ORDER — CYANOCOBALAMIN 1000 MCG/ML IJ SOLN
1000.0000 ug | Freq: Once | INTRAMUSCULAR | Status: AC
  Administered 2016-10-28: 1000 ug via INTRAMUSCULAR

## 2016-10-28 MED ORDER — HYDROCODONE-HOMATROPINE 5-1.5 MG/5ML PO SYRP: 5.0000 mL | ORAL_SOLUTION | Freq: Three times a day (TID) | ORAL | 0 refills | Status: DC | PRN

## 2016-10-28 NOTE — Assessment & Plan Note (Addendum)
Rapid strep test negative Likely viral, no need for antibiotics Symptomatic treatment discussed

## 2016-10-28 NOTE — Assessment & Plan Note (Addendum)
Rapid flu test neg, strep negative Symptomatic treatment discussed Hycodan cough syrup prescribed

## 2016-10-28 NOTE — Patient Instructions (Addendum)
You received a B12 injection today.  Your iron - B12 supplement was sent to your pharmacy.   Take advil three times a day with food for your sore throat. You can take tylenol in between if needed.   If your symptoms worsen or fail to improve, please contact our office for further instruction, or in case of emergency go directly to the emergency room at the closest medical facility.   General Recommendations:    Please drink plenty of fluids.  Get plenty of rest   Sleep in humidified air  Use saline nasal sprays  Netti pot  OTC Medications:  Decongestants - helps relieve congestion   Flonase (generic fluticasone) or Nasacort (generic triamcinolone) - please make sure to use the "cross-over" technique at a 45 degree angle towards the opposite eye as opposed to straight up the nasal passageway.   Sudafed (generic pseudoephedrine - Note this is the one that is available behind the pharmacy counter); Products with phenylephrine (-PE) may also be used but is often not as effective as pseudoephedrine.   If you have HIGH BLOOD PRESSURE - Coricidin HBP; AVOID any product that is -D as this contains pseudoephedrine which may increase your blood pressure.  Afrin (oxymetazoline) every 6-8 hours for up to 3 days.  Allergies - helps relieve runny nose, itchy eyes and sneezing   Claritin (generic loratidine), Allegra (fexofenidine), or Zyrtec (generic cyrterizine) for runny nose. These medications should not cause drowsiness.  Note - Benadryl (generic diphenhydramine) may be used however may cause drowsiness  Cough -   Delsym or Robitussin (generic dextromethorphan)  Expectorants - helps loosen mucus to ease removal   Mucinex (generic guaifenesin) as directed on the package.  Headaches / General Aches   Tylenol (generic acetaminophen) - DO NOT EXCEED 3 grams (3,000 mg) in a 24 hour time period  Advil/Motrin (generic ibuprofen)  Sore Throat -   Salt water  gargle   Chloraseptic (generic benzocaine) spray or lozenges / Sucrets (generic dyclonine)

## 2016-10-28 NOTE — Progress Notes (Signed)
Pre visit review using our clinic review tool, if applicable. No additional management support is needed unless otherwise documented below in the visit note. 

## 2016-10-28 NOTE — Progress Notes (Signed)
Subjective:    Patient ID: [name], female    DOB: 1985-11-19, 31 y.o.   MRN: [id number]  HPI She is here for an acute visit.   Flu like symptoms:  Her symptoms started yesterday.  Her entire body is sore.  She gets cold/hot.  Her throat is very sore.  She has nausea.  She has a dry cough, runny nose.  She denies SOB, wheeze, headaches.   She has a low B12 and is due for a B12 injection - she would like to get that today.    Medications and allergies reviewed with patient and updated if appropriate.  Patient Active Problem List   Diagnosis Date Noted  . Large breasts 09/08/2016  . Chronic zinc deficiency 06/25/2015  . B12 deficiency anemia 06/20/2015  . Hyperglycemia 06/19/2015  . Sleep apnea 07/27/2013  . Vitamin D deficiency 06/20/2013  . 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 Iron deficiency anemia 12/04/2011  . Severe obesity (BMI >= 40) ([place]) 12/04/2011    Current Outpatient Prescriptions on File Prior to Visit  Medication Sig Dispense Refill  . Cholecalciferol 2000 units TABS Take 1 tablet (2,000 Units total) by mouth daily. 90 tablet 3  . Iron Polysacch Cmplx-B12-FA 150-0.025-1 MG CAPS Take 2 tablets by mouth daily. 180 each 1  . phentermine 37.5 MG capsule Take 1 capsule (37.5 mg total) by mouth every morning. 30 capsule 2   No current facility-administered medications on file prior to visit.     Past Medical History:  Diagnosis Date  . Anemia   . Blood transfusion without reported diagnosis 2005  . Morbid obesity ([place])   . Sleep apnea    hx of    Past Surgical History:  Procedure Laterality Date  . BREATH TEK H PYLORI  02/09/2012   Procedure: BREATH TEK H PYLORI;  Surgeon: Pedro Earls, MD;  Location: [facility];  Service: General;  Laterality: N/A;  . GASTRIC BYPASS  08/29/2012  . HERNIA REPAIR  2014    Social History   Social History  . Marital status: Single    Spouse name: N/A  . Number of children: N/A  . Years of  education: N/A   Social History Main Topics  . Smoking status: Never Smoker  . Smokeless tobacco: Never Used  . Alcohol use No  . Drug use: No  . Sexual activity: Yes    Birth control/ protection: Pill   Other Topics Concern  . Not on file   Social History Narrative  . No narrative on file    Family History  Problem Relation Age of Onset  . Hypertension Father   . Hypertension Mother   . Alcohol abuse Neg Hx   . Arthritis Neg Hx   . Cancer Neg Hx   . Early death Neg Hx   . Heart disease Neg Hx   . Hyperlipidemia Neg Hx   . Kidney disease Neg Hx   . Stroke Neg Hx     Review of Systems  Constitutional: Positive for appetite change (decreased), chills and fever (subjective).  HENT: Positive for rhinorrhea and sore throat. Negative for congestion, ear pain, sinus pain and sinus pressure.   Respiratory: Positive for cough (minimal, dry). Negative for chest tightness, shortness of breath and wheezing.   Cardiovascular: Negative for chest pain.  Gastrointestinal: Positive for nausea. Negative for abdominal pain and diarrhea.  Musculoskeletal: Positive for myalgias.  Neurological: Negative for dizziness, light-headedness and headaches.  Objective:   Vitals:   10/28/16 0913  BP: 132/84  Pulse: 86  Resp: 18  Temp: (!) 100.5 F (38.1 C)   Filed Weights   10/28/16 0913  Weight: (!) 338 lb (153.3 kg)   Body mass index is 48.5 kg/m.  Wt Readings from Last 3 Encounters:  10/28/16 (!) 338 lb (153.3 kg)  09/08/16 (!) 336 lb 8 oz (152.6 kg)  12/27/15 (!) 327 lb 2 oz (148.4 kg)     Physical Exam GENERAL APPEARANCE: Appears stated age, mildly ill appearing, NAD EYES: conjunctiva clear, no icterus HEENT: bilateral tympanic membranes and ear canals normal, oropharynx with minimal erythema, no thyromegaly, trachea midline, no cervical or supraclavicular lymphadenopathy LUNGS: Clear to auscultation without wheeze or crackles, unlabored breathing, good air entry  bilaterally HEART: Normal S1,S2 without murmurs EXTREMITIES: Without clubbing, cyanosis, or edema        Assessment & Plan:   See Problem List for Assessment and Plan of chronic medical problems.

## 2016-10-28 NOTE — Assessment & Plan Note (Signed)
B12 injection today Recent B12 vitamin - she has not started taking it yet

## 2016-10-30 ENCOUNTER — Encounter: Payer: Self-pay | Admitting: Nurse Practitioner

## 2016-10-30 ENCOUNTER — Other Ambulatory Visit: Payer: BLUE CROSS/BLUE SHIELD

## 2016-10-30 ENCOUNTER — Ambulatory Visit (INDEPENDENT_AMBULATORY_CARE_PROVIDER_SITE_OTHER): Payer: BLUE CROSS/BLUE SHIELD | Admitting: Nurse Practitioner

## 2016-10-30 VITALS — BP 160/96 | HR 85 | Temp 98.7°F | Ht 70.0 in | Wt 334.0 lb

## 2016-10-30 DIAGNOSIS — J039 Acute tonsillitis, unspecified: Secondary | ICD-10-CM

## 2016-10-30 DIAGNOSIS — J029 Acute pharyngitis, unspecified: Secondary | ICD-10-CM

## 2016-10-30 LAB — POCT MONO (EPSTEIN BARR VIRUS): Mono, POC: NEGATIVE

## 2016-10-30 MED ORDER — AMOXICILLIN 875 MG PO TABS: 875.0000 mg | ORAL_TABLET | Freq: Two times a day (BID) | ORAL | 0 refills | Status: DC

## 2016-10-30 MED ORDER — IBUPROFEN 600 MG PO TABS: 600.0000 mg | ORAL_TABLET | Freq: Three times a day (TID) | ORAL | 0 refills | Status: DC | PRN

## 2016-10-30 NOTE — Progress Notes (Signed)
Subjective:  Patient ID: [name], female    DOB: 11/07/1985  Age: 31 y.o. MRN: [id number]  CC: Sore Throat (sore throat,hard to swallow going on for 5 days. saw doc but not better---took otc--not sleep too good)  Sore Throat   This is a new problem. The current episode started in the past 7 days. The problem has been unchanged. There has been no fever. The pain is severe. Associated symptoms include a hoarse voice, swollen glands and trouble swallowing. Pertinent negatives include no abdominal pain, congestion, coughing, diarrhea, drooling, ear discharge, ear pain, headaches, plugged ear sensation, neck pain, shortness of breath, stridor or vomiting. She has had no exposure to strep or mono. She has tried NSAIDs for the symptoms. The treatment provided mild relief.    Outpatient Medications Prior to Visit  Medication Sig Dispense Refill  . phentermine 37.5 MG capsule Take 1 capsule (37.5 mg total) by mouth every morning. 30 capsule 2  . HYDROcodone-homatropine (HYCODAN) 5-1.5 MG/5ML syrup Take 5 mLs by mouth every 8 (eight) hours as needed for cough. 120 mL 0  . Cholecalciferol 2000 units TABS Take 1 tablet (2,000 Units total) by mouth daily. (Patient not taking: Reported on 10/30/2016) 90 tablet 3  . Iron Polysacch Cmplx-B12-FA 150-0.025-1 MG CAPS Take 2 tablets by mouth daily. (Patient not taking: Reported on 10/30/2016) 180 each 1   No facility-administered medications prior to visit.     ROS See HPI  Objective:  BP (!) 160/96   Pulse 85   Temp 98.7 F (37.1 C)   Ht 5\' 10"  (1.778 m)   Wt (!) 334 lb (151.5 kg)   SpO2 99%   BMI 47.92 kg/m   BP Readings from Last 3 Encounters:  10/30/16 (!) 160/96  10/28/16 132/84  09/08/16 (!) 146/92    Wt Readings from Last 3 Encounters:  10/30/16 (!) 334 lb (151.5 kg)  10/28/16 (!) 338 lb (153.3 kg)  09/08/16 (!) 336 lb 8 oz (152.6 kg)    Physical Exam  Constitutional: She is oriented to person, place, and time.  HENT:    Right Ear: Tympanic membrane, external ear and ear canal normal.  Left Ear: Tympanic membrane, external ear and ear canal normal.  Nose: No mucosal edema or rhinorrhea. Right sinus exhibits no maxillary sinus tenderness and no frontal sinus tenderness. Left sinus exhibits no maxillary sinus tenderness and no frontal sinus tenderness.  Mouth/Throat: Uvula is midline. No trismus in the jaw. Oropharyngeal exudate and posterior oropharyngeal erythema present. No tonsillar abscesses.  Eyes: No scleral icterus.  Neck: Normal range of motion. Neck supple.  Cardiovascular: Normal rate and normal heart sounds.   Pulmonary/Chest: Effort normal and breath sounds normal.  Musculoskeletal: She exhibits no edema.  Lymphadenopathy:    She has cervical adenopathy.  Neurological: She is alert and oriented to person, place, and time.  Vitals reviewed.   Lab Results  Component Value Date   WBC 6.3 09/08/2016   HGB 7.5 Repeated and verified X2. (LL) 09/08/2016   HCT 25.9 Repeated and verified X2. (L) 09/08/2016   PLT 238.0 09/08/2016   GLUCOSE 84 09/08/2016   CHOL 147 12/04/2011   TRIG 90.0 12/04/2011   HDL 42.70 12/04/2011   LDLCALC 86 12/04/2011   ALT 15 09/08/2016   AST 15 09/08/2016   NA 140 09/08/2016   K 3.7 09/08/2016   CL 107 09/08/2016   CREATININE 0.61 09/08/2016   BUN 8 09/08/2016   CO2 28 09/08/2016   TSH  1.54 06/19/2015   HGBA1C 5.2 06/19/2015    No results found.  Assessment & Plan:   [name] was seen today for sore throat.  Diagnoses and all orders for this visit:  Acute pharyngitis, unspecified etiology -     Upper Respiratory Culture; Future -     POCT Mono (Epstein Barr Virus) -     amoxicillin (AMOXIL) 875 MG tablet; Take 1 tablet (875 mg total) by mouth 2 (two) times daily. -     ibuprofen (ADVIL,MOTRIN) 600 MG tablet; Take 1 tablet (600 mg total) by mouth every 8 (eight) hours as needed.  Acute tonsillitis, unspecified etiology -     Upper Respiratory Culture;  Future -     POCT Mono (Epstein Barr Virus) -     amoxicillin (AMOXIL) 875 MG tablet; Take 1 tablet (875 mg total) by mouth 2 (two) times daily. -     ibuprofen (ADVIL,MOTRIN) 600 MG tablet; Take 1 tablet (600 mg total) by mouth every 8 (eight) hours as needed.   I have discontinued Ms. Sacca HYDROcodone-homatropine. I am also having her start on amoxicillin and ibuprofen. Additionally, I am having her maintain her phentermine, Cholecalciferol, Iron Polysacch Cmplx-B12-FA, and NON FORMULARY.  Meds ordered this encounter  Medications  . NON FORMULARY  . amoxicillin (AMOXIL) 875 MG tablet    Sig: Take 1 tablet (875 mg total) by mouth 2 (two) times daily.    Dispense:  20 tablet    Refill:  0    Order Specific Question:   Supervising Provider    Answer:   Cassandria Anger [[id number]]  . ibuprofen (ADVIL,MOTRIN) 600 MG tablet    Sig: Take 1 tablet (600 mg total) by mouth every 8 (eight) hours as needed.    Dispense:  30 tablet    Refill:  0    Order Specific Question:   Supervising Provider    Answer:   Cassandria Anger [[id number]]    Follow-up: Return if symptoms worsen or fail to improve.  Wilfred Lacy, NP

## 2016-10-30 NOTE — Patient Instructions (Signed)
You will be contacted with lab results.  Start oral abx today.  If labs are normal, will refer to ENT  Pharyngitis Pharyngitis is redness, pain, and swelling (inflammation) of your pharynx. What are the causes? Pharyngitis is usually caused by infection. Most of the time, these infections are from viruses (viral) and are part of a cold. However, sometimes pharyngitis is caused by bacteria (bacterial). Pharyngitis can also be caused by allergies. Viral pharyngitis may be spread from person to person by coughing, sneezing, and personal items or utensils (cups, forks, spoons, toothbrushes). Bacterial pharyngitis may be spread from person to person by more intimate contact, such as kissing. What are the signs or symptoms? Symptoms of pharyngitis include:  Sore throat.  Tiredness (fatigue).  Low-grade fever.  Headache.  Joint pain and muscle aches.  Skin rashes.  Swollen lymph nodes.  Plaque-like film on throat or tonsils (often seen with bacterial pharyngitis). How is this diagnosed? Your health care provider will ask you questions about your illness and your symptoms. Your medical history, along with a physical exam, is often all that is needed to diagnose pharyngitis. Sometimes, a rapid strep test is done. Other lab tests may also be done, depending on the suspected cause. How is this treated? Viral pharyngitis will usually get better in 3-4 days without the use of medicine. Bacterial pharyngitis is treated with medicines that kill germs (antibiotics). Follow these instructions at home:  Drink enough water and fluids to keep your urine clear or pale yellow.  Only take over-the-counter or prescription medicines as directed by your health care provider:  If you are prescribed antibiotics, make sure you finish them even if you start to feel better.  Do not take aspirin.  Get lots of rest.  Gargle with 8 oz of salt water ( tsp of salt per 1 qt of water) as often as every 1-2  hours to soothe your throat.  Throat lozenges (if you are not at risk for choking) or sprays may be used to soothe your throat. Contact a health care provider if:  You have large, tender lumps in your neck.  You have a rash.  You cough up green, yellow-brown, or bloody spit. Get help right away if:  Your neck becomes stiff.  You drool or are unable to swallow liquids.  You vomit or are unable to keep medicines or liquids down.  You have severe pain that does not go away with the use of recommended medicines.  You have trouble breathing (not caused by a stuffy nose). This information is not intended to replace advice given to you by your health care provider. Make sure you discuss any questions you have with your health care provider. Document Released: 06/22/2005 Document Revised: 11/28/2015 Document Reviewed: 02/27/2013 Elsevier Interactive Patient Education  2017 [employer].

## 2016-10-30 NOTE — Progress Notes (Signed)
Pre visit review using our clinic review tool, if applicable. No additional management support is needed unless otherwise documented below in the visit note. 

## 2016-11-02 LAB — CULTURE, UPPER RESPIRATORY

## 2016-11-03 ENCOUNTER — Telehealth: Payer: Self-pay | Admitting: Nurse Practitioner

## 2016-11-03 NOTE — Telephone Encounter (Signed)
Patient called back about lab results. She was informed of the notes.

## 2017-02-17 ENCOUNTER — Other Ambulatory Visit: Payer: Self-pay | Admitting: Student

## 2017-02-17 DIAGNOSIS — Z3041 Encounter for surveillance of contraceptive pills: Secondary | ICD-10-CM

## 2017-03-02 ENCOUNTER — Other Ambulatory Visit: Payer: Self-pay | Admitting: Student

## 2017-03-02 DIAGNOSIS — Z3041 Encounter for surveillance of contraceptive pills: Secondary | ICD-10-CM

## 2017-03-26 ENCOUNTER — Other Ambulatory Visit: Payer: Self-pay | Admitting: Internal Medicine

## 2017-03-31 ENCOUNTER — Telehealth: Payer: Self-pay | Admitting: Obstetrics & Gynecology

## 2017-03-31 DIAGNOSIS — Z30011 Encounter for initial prescription of contraceptive pills: Secondary | ICD-10-CM

## 2017-03-31 NOTE — Telephone Encounter (Signed)
Patient want her Birth Control called in and also want a call when its done

## 2017-03-31 NOTE — Telephone Encounter (Signed)
°  Patient want her Birth Control called in and also want a call when its done

## 2017-04-02 MED ORDER — NORETHINDRONE 0.35 MG PO TABS: 1.0000 | ORAL_TABLET | Freq: Every day | ORAL | 0 refills | Status: DC

## 2017-04-02 NOTE — Telephone Encounter (Signed)
Patient called and left message stating she needs a refill on her OCPs until her appt on the 22nd. Refill sent in x1. Called & informed patient. Patient verbalized understanding & had no questions

## 2017-04-26 ENCOUNTER — Encounter: Payer: Self-pay | Admitting: Obstetrics & Gynecology

## 2017-04-26 ENCOUNTER — Ambulatory Visit (INDEPENDENT_AMBULATORY_CARE_PROVIDER_SITE_OTHER): Payer: BLUE CROSS/BLUE SHIELD | Admitting: Obstetrics & Gynecology

## 2017-04-26 VITALS — BP 138/72 | HR 75 | Ht 69.0 in | Wt 343.0 lb

## 2017-04-26 DIAGNOSIS — Z01419 Encounter for gynecological examination (general) (routine) without abnormal findings: Secondary | ICD-10-CM

## 2017-04-26 DIAGNOSIS — Z124 Encounter for screening for malignant neoplasm of cervix: Secondary | ICD-10-CM

## 2017-04-26 DIAGNOSIS — B3731 Acute candidiasis of vulva and vagina: Secondary | ICD-10-CM

## 2017-04-26 DIAGNOSIS — Z1151 Encounter for screening for human papillomavirus (HPV): Secondary | ICD-10-CM | POA: Diagnosis not present

## 2017-04-26 DIAGNOSIS — Z3041 Encounter for surveillance of contraceptive pills: Secondary | ICD-10-CM

## 2017-04-26 DIAGNOSIS — B373 Candidiasis of vulva and vagina: Secondary | ICD-10-CM

## 2017-04-26 DIAGNOSIS — Z113 Encounter for screening for infections with a predominantly sexual mode of transmission: Secondary | ICD-10-CM | POA: Diagnosis not present

## 2017-04-26 LAB — HM PAP SMEAR

## 2017-04-26 MED ORDER — NORETHINDRONE-ETH ESTRADIOL 1-35 MG-MCG PO TABS: 1.0000 | ORAL_TABLET | Freq: Every day | ORAL | 11 refills | Status: DC

## 2017-04-26 NOTE — Patient Instructions (Signed)
Preventive Care 18-39 Years, Female Preventive care refers to lifestyle choices and visits with your health care provider that can promote health and wellness. What does preventive care include?  A yearly physical exam. This is also called an annual well check.  Dental exams once or twice a year.  Routine eye exams. Ask your health care provider how often you should have your eyes checked.  Personal lifestyle choices, including: ? Daily care of your teeth and gums. ? Regular physical activity. ? Eating a healthy diet. ? Avoiding tobacco and drug use. ? Limiting alcohol use. ? Practicing safe sex. ? Taking vitamin and mineral supplements as recommended by your health care provider. What happens during an annual well check? The services and screenings done by your health care provider during your annual well check will depend on your age, overall health, lifestyle risk factors, and family history of disease. Counseling Your health care provider may ask you questions about your:  Alcohol use.  Tobacco use.  Drug use.  Emotional well-being.  Home and relationship well-being.  Sexual activity.  Eating habits.  Work and work Statistician.  Method of birth control.  Menstrual cycle.  Pregnancy history.  Screening You may have the following tests or measurements:  Height, weight, and BMI.  Diabetes screening. This is done by checking your blood sugar (glucose) after you have not eaten for a while (fasting).  Blood pressure.  Lipid and cholesterol levels. These may be checked every 5 years starting at age 66.  Skin check.  Hepatitis C blood test.  Hepatitis B blood test.  Sexually transmitted disease (STD) testing.  BRCA-related cancer screening. This may be done if you have a family history of breast, ovarian, tubal, or peritoneal cancers.  Pelvic exam and Pap test. This may be done every 3 years starting at age 40. Starting at age 59, this may be done every 5  years if you have a Pap test in combination with an HPV test.  Discuss your test results, treatment options, and if necessary, the need for more tests with your health care provider. Vaccines Your health care provider may recommend certain vaccines, such as:  Influenza vaccine. This is recommended every year.  Tetanus, diphtheria, and acellular pertussis (Tdap, Td) vaccine. You may need a Td booster every 10 years.  Varicella vaccine. You may need this if you have not been vaccinated.  HPV vaccine. If you are 69 or younger, you may need three doses over 6 months.  Measles, mumps, and rubella (MMR) vaccine. You may need at least one dose of MMR. You may also need a second dose.  Pneumococcal 13-valent conjugate (PCV13) vaccine. You may need this if you have certain conditions and were not previously vaccinated.  Pneumococcal polysaccharide (PPSV23) vaccine. You may need one or two doses if you smoke cigarettes or if you have certain conditions.  Meningococcal vaccine. One dose is recommended if you are age 27-21 years and a first-year college student living in a residence hall, or if you have one of several medical conditions. You may also need additional booster doses.  Hepatitis A vaccine. You may need this if you have certain conditions or if you travel or work in places where you may be exposed to hepatitis A.  Hepatitis B vaccine. You may need this if you have certain conditions or if you travel or work in places where you may be exposed to hepatitis B.  Haemophilus influenzae type b (Hib) vaccine. You may need this if  you have certain risk factors.  Talk to your health care provider about which screenings and vaccines you need and how often you need them. This information is not intended to replace advice given to you by your health care provider. Make sure you discuss any questions you have with your health care provider. Document Released: 08/18/2001 Document Revised: 03/11/2016  Document Reviewed: 04/23/2015 Elsevier Interactive Patient Education  2017 [employer].

## 2017-04-26 NOTE — Progress Notes (Signed)
GYNECOLOGY ANNUAL PREVENTATIVE CARE ENCOUNTER NOTE  Subjective:   [name] is a 31 y.o. G30P1011 female here for a routine annual gynecologic exam.  Current complaints: none.   Denies abnormal vaginal bleeding, discharge, pelvic pain, problems with intercourse or other gynecologic concerns.    Gynecologic History Patient's last menstrual period was 04/11/2017 (within days). Contraception: oral progesterone-only contraceptive Last Pap: 09/11/2015. Results were: normal  Obstetric History OB History  Gravida Para Term Preterm AB Living  2 1 1   1 1   SAB TAB Ectopic Multiple Live Births  1       1    # Outcome Date GA Lbr Len/2nd Weight Sex Delivery Anes PTL Lv  2 Term 01/15/14 [redacted]w[redacted]d 12:40 / 02:34 7 lb 8 oz (3.402 kg) M Vag-Spont EPI  LIV  1 SAB 04/24/08 [redacted]w[redacted]d       DEC      Past Medical History:  Diagnosis Date  . Anemia   . Blood transfusion without reported diagnosis 2005  . Morbid obesity ([place])   . Sleep apnea    hx of    Past Surgical History:  Procedure Laterality Date  . BREATH TEK H PYLORI  02/09/2012   Procedure: BREATH TEK H PYLORI;  Surgeon: Pedro Earls, MD;  Location: [facility];  Service: General;  Laterality: N/A;  . GASTRIC BYPASS  08/29/2012  . HERNIA REPAIR  2014    No current outpatient prescriptions on file prior to visit.   No current facility-administered medications on file prior to visit.     No Known Allergies  Social History   Social History  . Marital status: Single    Spouse name: N/A  . Number of children: N/A  . Years of education: N/A   Occupational History  . Not on file.   Social History Main Topics  . Smoking status: Never Smoker  . Smokeless tobacco: Never Used  . Alcohol use No  . Drug use: No  . Sexual activity: Yes    Birth control/ protection: Pill   Other Topics Concern  . Not on file   Social History Narrative  . No narrative on file    Family History  Problem Relation Age of Onset  .  Hypertension Father   . Hypertension Mother   . Alcohol abuse Neg Hx   . Arthritis Neg Hx   . Cancer Neg Hx   . Early death Neg Hx   . Heart disease Neg Hx   . Hyperlipidemia Neg Hx   . Kidney disease Neg Hx   . Stroke Neg Hx     The following portions of the patient's history were reviewed and updated as appropriate: allergies, current medications, past family history, past medical history, past social history, past surgical history and problem list.  Review of Systems Pertinent items noted in HPI and remainder of comprehensive ROS otherwise negative.   Objective:  BP 138/72   Pulse 75   Ht 5\' 9"  (1.753 m)   Wt (!) 343 lb (155.6 kg)   LMP 04/11/2017 (Within Days)   BMI 50.65 kg/m  CONSTITUTIONAL: Well-developed, well-nourished female in no acute distress.  HENT:  Normocephalic, atraumatic, External right and left ear normal. Oropharynx is clear and moist EYES: Conjunctivae and EOM are normal. Pupils are equal, round, and reactive to light. No scleral icterus.  NECK: Normal range of motion, supple, no masses.  Normal thyroid.  SKIN: Skin is warm and dry. No rash noted. Not diaphoretic.  No erythema. No pallor. Has old hidradenitis scars around torso and axilla, also lateral breasts. NEUROLOGIC: Alert and oriented to person, place, and time. Normal reflexes, muscle tone coordination. No cranial nerve deficit noted. PSYCHIATRIC: Normal mood and affect. Normal behavior. Normal judgment and thought content. CARDIOVASCULAR: Normal heart rate noted, regular rhythm RESPIRATORY: Clear to auscultation bilaterally. Effort and breath sounds normal, no problems with respiration noted. BREASTS: Symmetric in size. No masses, skin changes, nipple drainage, or lymphadenopathy. ABDOMEN: Soft, obese, normal bowel sounds, no distention appreciated.  No tenderness, rebound or guarding.  PELVIC: Normal appearing external genitalia; normal appearing vaginal mucosa and cervix.  Normal appearing  discharge.  Pap smear obtained.  Unable to palpate uterus or adnexa secondary to habitus.  MUSCULOSKELETAL: Normal range of motion. No tenderness.  No cyanosis, clubbing, or edema.  2+ distal pulses.   Assessment and Plan:  1. Well woman exam with routine gynecological exam 2. Screen for STD (sexually transmitted disease) - Cytology - PAP - Hepatitis B Surface AntiGEN - Hepatitis C Antibody - HIV antibody (with reflex) - RPR Will follow up results of pap smear and STI screen  and manage accordingly.  3. Oral contraceptive pill surveillance Talked about need for switching from POP (was on this for breastfeeding, never switched).  Declines LARCs after counseling. Will switch to [place]. - norethindrone-ethinyl estradiol 1/35 (ORTHO-NOVUM, NORTREL,CYCLAFEM) tablet; Take 1 tablet by mouth daily.  Dispense: 1 Package; Refill: 11  Routine preventative health maintenance measures emphasized. Please refer to After Visit Summary for other counseling recommendations.    Verita Schneiders, MD, [place] Attending Obstetrician & Gynecologist, [place] for [facility]

## 2017-04-27 LAB — RPR: RPR Ser Ql: NONREACTIVE

## 2017-04-27 LAB — HEPATITIS C ANTIBODY

## 2017-04-27 LAB — HEPATITIS B SURFACE ANTIGEN: HEP B S AG: NEGATIVE

## 2017-04-27 LAB — HIV ANTIBODY (ROUTINE TESTING W REFLEX): HIV Screen 4th Generation wRfx: NONREACTIVE

## 2017-04-28 LAB — CYTOLOGY - PAP
CHLAMYDIA, DNA PROBE: NEGATIVE
Diagnosis: NEGATIVE
HPV (WINDOPATH): NOT DETECTED
NEISSERIA GONORRHEA: NEGATIVE
TRICH (WINDOWPATH): NEGATIVE

## 2017-04-28 MED ORDER — FLUCONAZOLE 150 MG PO TABS: 150.0000 mg | ORAL_TABLET | Freq: Once | ORAL | 3 refills | Status: AC

## 2017-04-28 NOTE — Addendum Note (Signed)
Addended by: Verita Schneiders A on: 04/28/2017 04:32 PM   Modules accepted: Orders

## 2017-05-05 ENCOUNTER — Encounter: Payer: Self-pay | Admitting: *Deleted

## 2017-07-21 ENCOUNTER — Ambulatory Visit: Payer: Self-pay | Admitting: Internal Medicine

## 2017-07-28 ENCOUNTER — Other Ambulatory Visit: Payer: BLUE CROSS/BLUE SHIELD

## 2017-07-28 ENCOUNTER — Ambulatory Visit (INDEPENDENT_AMBULATORY_CARE_PROVIDER_SITE_OTHER): Payer: BLUE CROSS/BLUE SHIELD | Admitting: Internal Medicine

## 2017-07-28 ENCOUNTER — Encounter: Payer: Self-pay | Admitting: Internal Medicine

## 2017-07-28 ENCOUNTER — Other Ambulatory Visit (INDEPENDENT_AMBULATORY_CARE_PROVIDER_SITE_OTHER): Payer: BLUE CROSS/BLUE SHIELD

## 2017-07-28 VITALS — BP 130/78 | HR 68 | Temp 98.8°F | Resp 16 | Ht 69.0 in | Wt 346.1 lb

## 2017-07-28 DIAGNOSIS — D729 Disorder of white blood cells, unspecified: Secondary | ICD-10-CM

## 2017-07-28 DIAGNOSIS — D510 Vitamin B12 deficiency anemia due to intrinsic factor deficiency: Secondary | ICD-10-CM

## 2017-07-28 DIAGNOSIS — E559 Vitamin D deficiency, unspecified: Secondary | ICD-10-CM

## 2017-07-28 DIAGNOSIS — Z0000 Encounter for general adult medical examination without abnormal findings: Secondary | ICD-10-CM

## 2017-07-28 DIAGNOSIS — Z9884 Bariatric surgery status: Secondary | ICD-10-CM

## 2017-07-28 DIAGNOSIS — E60 Dietary zinc deficiency: Secondary | ICD-10-CM

## 2017-07-28 DIAGNOSIS — Z6841 Body Mass Index (BMI) 40.0 and over, adult: Secondary | ICD-10-CM

## 2017-07-28 DIAGNOSIS — D500 Iron deficiency anemia secondary to blood loss (chronic): Secondary | ICD-10-CM | POA: Diagnosis not present

## 2017-07-28 DIAGNOSIS — F5101 Primary insomnia: Secondary | ICD-10-CM

## 2017-07-28 LAB — CBC WITH DIFFERENTIAL/PLATELET
Basophils Absolute: 0 10*3/uL (ref 0.0–0.1)
Basophils Relative: 0.5 % (ref 0.0–3.0)
EOS ABS: 0.6 10*3/uL (ref 0.0–0.7)
EOS PCT: 8.2 % — AB (ref 0.0–5.0)
HCT: 26 % — ABNORMAL LOW (ref 36.0–46.0)
Hemoglobin: 7.4 g/dL — CL (ref 12.0–15.0)
LYMPHS ABS: 1.3 10*3/uL (ref 0.7–4.0)
Lymphocytes Relative: 18.3 % (ref 12.0–46.0)
MCHC: 28.4 g/dL — AB (ref 30.0–36.0)
MCV: 57.5 fl — ABNORMAL LOW (ref 78.0–100.0)
MONO ABS: 0.5 10*3/uL (ref 0.1–1.0)
Monocytes Relative: 6.7 % (ref 3.0–12.0)
NEUTROS PCT: 66.3 % (ref 43.0–77.0)
Neutro Abs: 4.8 10*3/uL (ref 1.4–7.7)
Platelets: 264 10*3/uL (ref 150.0–400.0)
RBC: 4.51 Mil/uL (ref 3.87–5.11)
RDW: 19.7 % — ABNORMAL HIGH (ref 11.5–15.5)
WBC: 7.3 10*3/uL (ref 4.0–10.5)

## 2017-07-28 LAB — LIPID PANEL
Cholesterol: 162 mg/dL (ref 0–200)
HDL: 64.5 mg/dL (ref >39.00)
LDL Cholesterol: 64 mg/dL (ref 0–99)
NONHDL: 97.5
Total CHOL/HDL Ratio: 3
Triglycerides: 169 mg/dL — ABNORMAL HIGH (ref 0.0–149.0)
VLDL: 33.8 mg/dL (ref 0.0–40.0)

## 2017-07-28 LAB — FOLATE: Folate: >23.6 ng/mL (ref >5.9)

## 2017-07-28 LAB — FERRITIN: Ferritin: 2.3 ng/mL — ABNORMAL LOW (ref 10.0–291.0)

## 2017-07-28 LAB — VITAMIN B12: VITAMIN B 12: 167 pg/mL — AB (ref 211–911)

## 2017-07-28 LAB — IBC PANEL
IRON: 24 ug/dL — AB (ref 42–145)
SATURATION RATIOS: 3.4 % — AB (ref 20.0–50.0)
Transferrin: 504 mg/dL — ABNORMAL HIGH (ref 212.0–360.0)

## 2017-07-28 LAB — VITAMIN D 25 HYDROXY (VIT D DEFICIENCY, FRACTURES): VITD: 4.93 ng/mL — AB (ref 30.00–100.00)

## 2017-07-28 MED ORDER — CHOLECALCIFEROL 50 MCG (2000 UT) PO TABS: 2.0000 | ORAL_TABLET | Freq: Every day | ORAL | 1 refills | Status: DC

## 2017-07-28 MED ORDER — PHENTERMINE HCL 37.5 MG PO CAPS: 37.5000 mg | ORAL_CAPSULE | ORAL | 2 refills | Status: DC

## 2017-07-28 MED ORDER — SUVOREXANT 15 MG PO TABS: 1.0000 | ORAL_TABLET | Freq: Every day | ORAL | 3 refills | Status: DC

## 2017-07-28 MED ORDER — CYANOCOBALAMIN 2000 MCG PO TABS: 2000.0000 ug | ORAL_TABLET | Freq: Every day | ORAL | 1 refills | Status: DC

## 2017-07-28 MED ORDER — FERROUS SULFATE 325 (65 FE) MG PO TABS: 325.0000 mg | ORAL_TABLET | Freq: Three times a day (TID) | ORAL | 1 refills | Status: DC

## 2017-07-28 NOTE — Progress Notes (Signed)
Subjective:  Patient ID: [name] [name], female    DOB: September 13, 1985  Age: 32 y.o. MRN: [id number]  CC: Anemia   HPI [name] [name] presents for f/up - She complains of fatigue, shortness of breath, and insomnia.  She has both difficulty falling asleep and frequent awakenings.  She denies snoring or sleep apnea.  She underwent bariatric surgery about 3 years ago.  She is not taking any vitamin supplements.  She tells me her menstrual cycles are not particularly long or heavy.  Outpatient Medications Prior to Visit  Medication Sig Dispense Refill  . norethindrone-ethinyl estradiol 1/35 (ORTHO-NOVUM, NORTREL,CYCLAFEM) tablet Take 1 tablet by mouth daily. 1 Package 11   No facility-administered medications prior to visit.     ROS Review of Systems  Constitutional: Positive for fatigue. Negative for appetite change, diaphoresis and unexpected weight change.  HENT: Negative.  Negative for trouble swallowing.   Eyes: Negative for visual disturbance.  Respiratory: Positive for shortness of breath. Negative for cough, choking, chest tightness and wheezing.   Cardiovascular: Negative for chest pain, palpitations and leg swelling.  Gastrointestinal: Negative for abdominal pain, constipation, diarrhea, nausea and vomiting.  Endocrine: Negative for cold intolerance and heat intolerance.  Genitourinary: Negative.  Negative for vaginal bleeding.  Musculoskeletal: Negative.   Skin: Negative for color change and pallor.  Allergic/Immunologic: Negative.   Neurological: Negative.  Negative for dizziness, weakness and light-headedness.  Hematological: Negative for adenopathy. Does not bruise/bleed easily.  Psychiatric/Behavioral: Negative.     Objective:  BP 130/78 (BP Location: Left Arm, Patient Position: Sitting, Cuff Size: Large)   Pulse 68   Temp 98.8 F (37.1 C) (Oral)   Resp 16   Ht 5\' 9"  (1.753 m)   Wt (!) 346 lb 2 oz (157 kg)   LMP 07/18/2017 (Exact Date)   SpO2 96%    BMI 51.11 kg/m   BP Readings from Last 3 Encounters:  07/28/17 130/78  04/26/17 138/72  10/30/16 (!) 160/96    Wt Readings from Last 3 Encounters:  07/28/17 (!) 346 lb 2 oz (157 kg)  04/26/17 (!) 343 lb (155.6 kg)  10/30/16 (!) 334 lb (151.5 kg)    Physical Exam  Constitutional: She is oriented to person, place, and time. No distress.  HENT:  Mouth/Throat: Oropharynx is clear and moist. No oropharyngeal exudate.  Eyes: Conjunctivae are normal. Left eye exhibits no discharge. No scleral icterus.  Neck: Normal range of motion. Neck supple. No JVD present. No thyromegaly present.  Cardiovascular: Normal rate, regular rhythm and normal heart sounds. Exam reveals no gallop.  No murmur heard. Pulmonary/Chest: Effort normal and breath sounds normal. No respiratory distress. She has no wheezes. She has no rales.  Abdominal: Soft. Bowel sounds are normal. She exhibits no distension and no mass. There is no tenderness.  Musculoskeletal: Normal range of motion. She exhibits no edema or tenderness.  Lymphadenopathy:    She has no cervical adenopathy.  Neurological: She is alert and oriented to person, place, and time.  Skin: Skin is warm and dry. No rash noted. She is not diaphoretic. No erythema. No pallor.  Vitals reviewed.   Lab Results  Component Value Date   WBC 7.3 07/28/2017   HGB 7.4 Repeated and verified X2. (LL) 07/28/2017   HCT 26.0 (L) 07/28/2017   PLT 264.0 07/28/2017   GLUCOSE 84 09/08/2016   CHOL 162 07/28/2017   TRIG 169.0 (H) 07/28/2017   HDL 64.50 07/28/2017   LDLCALC 64 07/28/2017   ALT 15  09/08/2016   AST 15 09/08/2016   NA 140 09/08/2016   K 3.7 09/08/2016   CL 107 09/08/2016   CREATININE 0.61 09/08/2016   BUN 8 09/08/2016   CO2 28 09/08/2016   TSH 1.54 06/19/2015   HGBA1C 5.2 06/19/2015    No results found.  Assessment & Plan:   [name] [name] was seen today for anemia.  Diagnoses and all orders for this visit:  Iron deficiency anemia due to chronic  blood loss- she has symptomatic anemia and a low iron level.  I will prescribe an iron supplement but I am concerned she may not absorb it so have asked her to see hematology to see if she would benefit from an iron infusion. -     CBC with Differential/Platelet; Future -     IBC panel; Future -     Ferritin; Future -     ferrous sulfate 325 (65 FE) MG tablet; Take 1 tablet (325 mg total) by mouth 3 (three) times daily with meals. -     Ambulatory referral to Hematology  Vitamin B12 deficiency anemia due to intrinsic factor deficiency- Her B12 level is low.  Will start high-dose oral B12 supplementation but have also asked her to come in for a B12 injection. -     CBC with Differential/Platelet; Future -     Vitamin B12; Future -     Folate; Future -     cyanocobalamin 2000 MCG tablet; Take 1 tablet (2,000 mcg total) by mouth daily.  Chronic zinc deficiency- I will monitor for zinc deficiency.  Will treat if indicated. -     CBC with Differential/Platelet; Future -     Zinc; Future  History of Roux-en-Y gastric bypass -     Vitamin B1; Future -     VITAMIN D 25 Hydroxy (Vit-D Deficiency, Fractures); Future  Severe obesity (BMI >= 40) ([place])- She will try a course of phentermine to help her lose weight.  Vitamin D deficiency -     Cholecalciferol 2000 units TABS; Take 2 tablets (4,000 Units total) by mouth daily.  Routine general medical examination at a health care facility- exam completed, labs reviewed, she refused a flu vaccine, [name] smear is up-to-date, patient education material was given. -     Lipid panel; Future -     HIV antibody; Future  Primary insomnia -     Suvorexant (BELSOMRA) 15 MG TABS; Take 1 tablet by mouth at bedtime.  Morbid obesity with BMI of 50.0-59.9, adult (HCC) -     phentermine 37.5 MG capsule; Take 1 capsule (37.5 mg total) by mouth every morning.   I am having [name] [name]. [name] [name] start on Suvorexant, phentermine, ferrous sulfate, cyanocobalamin, and  Cholecalciferol. I am also having her maintain her norethindrone-ethinyl estradiol 1/35.  Meds ordered this encounter  Medications  . Suvorexant (BELSOMRA) 15 MG TABS    Sig: Take 1 tablet by mouth at bedtime.    Dispense:  30 tablet    Refill:  3  . phentermine 37.5 MG capsule    Sig: Take 1 capsule (37.5 mg total) by mouth every morning.    Dispense:  30 capsule    Refill:  2  . ferrous sulfate 325 (65 FE) MG tablet    Sig: Take 1 tablet (325 mg total) by mouth 3 (three) times daily with meals.    Dispense:  270 tablet    Refill:  1  . cyanocobalamin 2000 MCG tablet    Sig: Take  1 tablet (2,000 mcg total) by mouth daily.    Dispense:  90 tablet    Refill:  1  . Cholecalciferol 2000 units TABS    Sig: Take 2 tablets (4,000 Units total) by mouth daily.    Dispense:  180 tablet    Refill:  1     Follow-up: Return in about 3 months (around 10/26/2017).  Scarlette Calico, MD

## 2017-07-28 NOTE — Patient Instructions (Signed)
Preventive Care 18-39 Years, Female Preventive care refers to lifestyle choices and visits with your health care provider that can promote health and wellness. What does preventive care include?  A yearly physical exam. This is also called an annual well check.  Dental exams once or twice a year.  Routine eye exams. Ask your health care provider how often you should have your eyes checked.  Personal lifestyle choices, including: ? Daily care of your teeth and gums. ? Regular physical activity. ? Eating a healthy diet. ? Avoiding tobacco and drug use. ? Limiting alcohol use. ? Practicing safe sex. ? Taking vitamin and mineral supplements as recommended by your health care provider. What happens during an annual well check? The services and screenings done by your health care provider during your annual well check will depend on your age, overall health, lifestyle risk factors, and family history of disease. Counseling Your health care provider may ask you questions about your:  Alcohol use.  Tobacco use.  Drug use.  Emotional well-being.  Home and relationship well-being.  Sexual activity.  Eating habits.  Work and work Statistician.  Method of birth control.  Menstrual cycle.  Pregnancy history.  Screening You may have the following tests or measurements:  Height, weight, and BMI.  Diabetes screening. This is done by checking your blood sugar (glucose) after you have not eaten for a while (fasting).  Blood pressure.  Lipid and cholesterol levels. These may be checked every 5 years starting at age 66.  Skin check.  Hepatitis C blood test.  Hepatitis B blood test.  Sexually transmitted disease (STD) testing.  BRCA-related cancer screening. This may be done if you have a family history of breast, ovarian, tubal, or peritoneal cancers.  Pelvic exam and Pap test. This may be done every 3 years starting at age 40. Starting at age 59, this may be done every 5  years if you have a Pap test in combination with an HPV test.  Discuss your test results, treatment options, and if necessary, the need for more tests with your health care provider. Vaccines Your health care provider may recommend certain vaccines, such as:  Influenza vaccine. This is recommended every year.  Tetanus, diphtheria, and acellular pertussis (Tdap, Td) vaccine. You may need a Td booster every 10 years.  Varicella vaccine. You may need this if you have not been vaccinated.  HPV vaccine. If you are 69 or younger, you may need three doses over 6 months.  Measles, mumps, and rubella (MMR) vaccine. You may need at least one dose of MMR. You may also need a second dose.  Pneumococcal 13-valent conjugate (PCV13) vaccine. You may need this if you have certain conditions and were not previously vaccinated.  Pneumococcal polysaccharide (PPSV23) vaccine. You may need one or two doses if you smoke cigarettes or if you have certain conditions.  Meningococcal vaccine. One dose is recommended if you are age 27-21 years and a first-year college student living in a residence hall, or if you have one of several medical conditions. You may also need additional booster doses.  Hepatitis A vaccine. You may need this if you have certain conditions or if you travel or work in places where you may be exposed to hepatitis A.  Hepatitis B vaccine. You may need this if you have certain conditions or if you travel or work in places where you may be exposed to hepatitis B.  Haemophilus influenzae type b (Hib) vaccine. You may need this if  you have certain risk factors.  Talk to your health care provider about which screenings and vaccines you need and how often you need them. This information is not intended to replace advice given to you by your health care provider. Make sure you discuss any questions you have with your health care provider. Document Released: 08/18/2001 Document Revised: 03/11/2016  Document Reviewed: 04/23/2015 Elsevier Interactive Patient Education  [employer].

## 2017-07-29 LAB — PATHOLOGIST SMEAR REVIEW

## 2017-07-31 ENCOUNTER — Encounter: Payer: Self-pay | Admitting: Internal Medicine

## 2017-07-31 ENCOUNTER — Other Ambulatory Visit: Payer: Self-pay | Admitting: Internal Medicine

## 2017-07-31 DIAGNOSIS — E60 Dietary zinc deficiency: Secondary | ICD-10-CM

## 2017-07-31 MED ORDER — ZINC GLUCONATE 50 MG PO TABS: 50.0000 mg | ORAL_TABLET | Freq: Every day | ORAL | 1 refills | Status: DC

## 2017-08-01 LAB — HIV ANTIBODY (ROUTINE TESTING W REFLEX): HIV 1&2 Ab, 4th Generation: NONREACTIVE

## 2017-08-01 LAB — ZINC: ZINC: 59 ug/dL — AB (ref 60–130)

## 2017-08-01 LAB — VITAMIN B1

## 2017-08-02 ENCOUNTER — Other Ambulatory Visit: Payer: Self-pay | Admitting: Internal Medicine

## 2017-08-02 ENCOUNTER — Encounter: Payer: Self-pay | Admitting: Internal Medicine

## 2017-08-02 DIAGNOSIS — E519 Thiamine deficiency, unspecified: Secondary | ICD-10-CM | POA: Insufficient documentation

## 2017-08-02 MED ORDER — VITAMIN B-1 100 MG PO TABS: 100.0000 mg | ORAL_TABLET | Freq: Every day | ORAL | 1 refills | Status: DC

## 2017-08-05 ENCOUNTER — Ambulatory Visit (INDEPENDENT_AMBULATORY_CARE_PROVIDER_SITE_OTHER): Payer: BLUE CROSS/BLUE SHIELD

## 2017-08-05 DIAGNOSIS — E538 Deficiency of other specified B group vitamins: Secondary | ICD-10-CM | POA: Diagnosis not present

## 2017-08-05 MED ORDER — CYANOCOBALAMIN 1000 MCG/ML IJ SOLN
1000.0000 ug | Freq: Once | INTRAMUSCULAR | Status: AC
  Administered 2017-08-05: 1000 ug via INTRAMUSCULAR

## 2017-09-01 ENCOUNTER — Ambulatory Visit: Payer: BLUE CROSS/BLUE SHIELD | Admitting: Obstetrics & Gynecology

## 2017-09-21 ENCOUNTER — Ambulatory Visit: Payer: BLUE CROSS/BLUE SHIELD | Admitting: Obstetrics & Gynecology

## 2017-10-14 ENCOUNTER — Encounter: Payer: Self-pay | Admitting: *Deleted

## 2017-10-14 ENCOUNTER — Ambulatory Visit: Payer: BLUE CROSS/BLUE SHIELD | Admitting: Student

## 2017-10-14 NOTE — Progress Notes (Signed)
[name] did not keep her scheduled appointment for follow up. Per discussion with Malva Limes does not need to be called, may reschedule if she calls.

## 2017-10-26 ENCOUNTER — Ambulatory Visit: Payer: BLUE CROSS/BLUE SHIELD | Admitting: Internal Medicine

## 2017-10-26 DIAGNOSIS — Z2089 Contact with and (suspected) exposure to other communicable diseases: Secondary | ICD-10-CM

## 2017-11-23 ENCOUNTER — Ambulatory Visit: Payer: BLUE CROSS/BLUE SHIELD | Admitting: Internal Medicine

## 2017-11-23 ENCOUNTER — Encounter: Payer: Self-pay | Admitting: Internal Medicine

## 2017-11-23 ENCOUNTER — Ambulatory Visit (INDEPENDENT_AMBULATORY_CARE_PROVIDER_SITE_OTHER)
Admission: RE | Admit: 2017-11-23 | Discharge: 2017-11-23 | Disposition: A | Discharge status: Home / Self Care | Payer: BLUE CROSS/BLUE SHIELD | Source: Ambulatory Visit | Attending: Internal Medicine | Admitting: Internal Medicine

## 2017-11-23 VITALS — BP 154/96 | HR 84 | Temp 98.5°F | Resp 16 | Wt 336.0 lb

## 2017-11-23 DIAGNOSIS — M25561 Pain in right knee: Secondary | ICD-10-CM

## 2017-11-23 DIAGNOSIS — M222X9 Patellofemoral disorders, unspecified knee: Secondary | ICD-10-CM | POA: Insufficient documentation

## 2017-11-23 DIAGNOSIS — L989 Disorder of the skin and subcutaneous tissue, unspecified: Secondary | ICD-10-CM | POA: Insufficient documentation

## 2017-11-23 MED ORDER — MELOXICAM 15 MG PO TABS: 15.0000 mg | ORAL_TABLET | Freq: Every day | ORAL | 0 refills | Status: DC

## 2017-11-23 NOTE — Patient Instructions (Addendum)
Have an x-ray of your knee today.  We will call you with the results.   Schedule an appointment with sport medicine.     Take the meloxicam once daily with food.  Do not take any advil or aleve while taking this.   Keep icing the knee   A referral for dermatology was ordered.

## 2017-11-23 NOTE — Assessment & Plan Note (Signed)
X-ray today Will refer to sports medicine for further evaluation 2 new icing knee Start meloxicam 15 mg daily-take with food.  Do not take any Advil or Aleve while taking this medication.  Stop if she experiences stomach upset

## 2017-11-23 NOTE — Progress Notes (Signed)
Subjective:    Patient ID: [name], female    DOB: [birth year], 32 y.o.   MRN: [id number]  HPI The patient is here for an acute visit.   Right knee pain:  It hurts to walk and put any pressure on it.  She has decreased flexion and extension and pain with full extension and flexion.  It started on week ago.  She was doing a lot of walking a few days prior to the pain starting on vacation.  After walking extensively she had no pain there was only a few days later that she woke up with right knee discomfort. She denies any other injury or accident.  She denies prior knee pain or injury.  When she sleeps at night she has to turn it a certain way.  The knee feels swollen and tight.   She denies calf or numbness/tingling. She took aleve.  She iced it and elevated it.  Both helped minimally.  Skin abnormalities: She has a rash on her leg and also has had other chronic skin abnormalities that previous dermatologist have not been able to help her with.  She is interested in seeing a different dermatologist for evaluation and treatment.  Medications and allergies reviewed with patient and updated if appropriate.  Patient Active Problem List   Diagnosis Date Noted  . Thiamine deficiency 08/02/2017  . 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 Morbid obesity with BMI of 50.0-59.9, adult ([place]) 07/28/2017  . Chronic zinc deficiency 06/25/2015  . B12 deficiency anemia 06/20/2015  . Hyperglycemia 06/19/2015  . Sleep apnea 07/27/2013  . Vitamin D deficiency 06/20/2013  . 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 Routine general medical examination at a health care facility 12/04/2011  . Iron deficiency anemia 12/04/2011  . Severe obesity (BMI >= 40) ([place]) 12/04/2011    Current Outpatient Medications on File Prior to Visit  Medication Sig Dispense Refill  . Cholecalciferol 2000 units TABS Take 2 tablets (4,000 Units total) by mouth daily. 180 tablet 1  . cyanocobalamin 2000 MCG tablet Take  1 tablet (2,000 mcg total) by mouth daily. 90 tablet 1  . ferrous sulfate 325 (65 FE) MG tablet Take 1 tablet (325 mg total) by mouth 3 (three) times daily with meals. 270 tablet 1  . norethindrone-ethinyl estradiol 1/35 (ORTHO-NOVUM, NORTREL,CYCLAFEM) tablet Take 1 tablet by mouth daily. 1 Package 11  . phentermine 37.5 MG capsule Take 1 capsule (37.5 mg total) by mouth every morning. 30 capsule 2  . Suvorexant (BELSOMRA) 15 MG TABS Take 1 tablet by mouth at bedtime. 30 tablet 3  . thiamine (VITAMIN B-1) 100 MG tablet Take 1 tablet (100 mg total) by mouth daily. 90 tablet 1  . zinc gluconate 50 MG tablet Take 1 tablet (50 mg total) by mouth daily. 90 tablet 1   No current facility-administered medications on file prior to visit.     Past Medical History:  Diagnosis Date  . Anemia   . Blood transfusion without reported diagnosis 2005  . Morbid obesity ([place])   . Sleep apnea    hx of    Past Surgical History:  Procedure Laterality Date  . BREATH TEK H PYLORI  02/09/2012   Procedure: BREATH TEK H PYLORI;  Surgeon: Pedro Earls, MD;  Location: [facility];  Service: General;  Laterality: N/A;  . GASTRIC BYPASS  08/29/2012  . HERNIA REPAIR  2014    Social History   Socioeconomic History  . Marital status: Single  Spouse name: Not on file  . Number of children: Not on file  . Years of education: Not on file  . Highest education level: Not on file  Occupational History  . Not on file  Social Needs  . Financial resource strain: Not on file  . Food insecurity:    Worry: Not on file    Inability: Not on file  . Transportation needs:    Medical: Not on file    Non-medical: Not on file  Tobacco Use  . Smoking status: Never Smoker  . Smokeless tobacco: Never Used  Substance and Sexual Activity  . Alcohol use: No  . Drug use: No  . Sexual activity: Yes    Birth control/protection: Pill  Lifestyle  . Physical activity:    Days per week: Not on file    Minutes per  session: Not on file  . Stress: Not on file  Relationships  . Social connections:    Talks on phone: Not on file    Gets together: Not on file    Attends religious service: Not on file    Active member of club or organization: Not on file    Attends meetings of clubs or organizations: Not on file    Relationship status: Not on file  Other Topics Concern  . Not on file  Social History Narrative  . Not on file    Family History  Problem Relation Age of Onset  . Hypertension Father   . Hypertension Mother   . Alcohol abuse Neg Hx   . Arthritis Neg Hx   . Cancer Neg Hx   . Early death Neg Hx   . Heart disease Neg Hx   . Hyperlipidemia Neg Hx   . Kidney disease Neg Hx   . Stroke Neg Hx     Review of Systems  Constitutional: Negative for fever.  Cardiovascular: Negative for leg swelling.  Musculoskeletal: Positive for arthralgias and joint swelling.  Skin: Negative for color change.  Neurological: Negative for weakness and numbness.       Objective:   Vitals:   11/23/17 1330  BP: (!) 154/96  Pulse: 84  Resp: 16  Temp: 98.5 F (36.9 C)   BP Readings from Last 3 Encounters:  11/23/17 (!) 154/96  07/28/17 130/78  04/26/17 138/72   Wt Readings from Last 3 Encounters:  11/23/17 (!) 336 lb (152.4 kg)  07/28/17 (!) 346 lb 2 oz (157 kg)  04/26/17 (!) 343 lb (155.6 kg)   Body mass index is 49.62 kg/m.   Physical Exam    A right knee exam was performed.   SKIN: intact, no bruising   SWELLING: No obvious swelling EFFUSION: yes, probable mild WARMTH: no warmth  TENDERNESS: Mild tenderness on medial aspect of knee  ROM: full extension, full flexion, but pain with extension and flexion GAIT: walks with a limp   NEUROLOGICAL EXAM: normal sensation  CALF TENDERNESS: no  CALF SWELLING: no       Assessment & Plan:    See Problem List for Assessment and Plan of chronic medical problems.

## 2017-11-23 NOTE — Assessment & Plan Note (Signed)
Dermatology referral ordered.  

## 2017-11-26 ENCOUNTER — Ambulatory Visit: Payer: BLUE CROSS/BLUE SHIELD | Admitting: Family Medicine

## 2017-12-02 ENCOUNTER — Ambulatory Visit: Payer: BLUE CROSS/BLUE SHIELD | Admitting: Family Medicine

## 2017-12-02 ENCOUNTER — Encounter: Payer: Self-pay | Admitting: Family Medicine

## 2017-12-02 DIAGNOSIS — M222X1 Patellofemoral disorders, right knee: Secondary | ICD-10-CM

## 2017-12-02 MED ORDER — NAPROXEN-ESOMEPRAZOLE 500-20 MG PO TBEC: 1.0000 | DELAYED_RELEASE_TABLET | Freq: Two times a day (BID) | ORAL | 3 refills | Status: DC

## 2017-12-02 MED ORDER — DICLOFENAC SODIUM 2 % TD SOLN: 1.0000 "application " | Freq: Two times a day (BID) | TRANSDERMAL | 3 refills | Status: DC

## 2017-12-02 NOTE — Patient Instructions (Signed)
Nice to meet you!  Please try the exercises  Please try the medications I have sent in  Please follow up with me in 4 weeks.

## 2017-12-02 NOTE — Progress Notes (Signed)
[name] - 32 y.o. female MRN [id number]  Date of birth: 01/09/1986  SUBJECTIVE:  Including CC & ROS.  Chief Complaint  Patient presents with  . Right knee pain    [name] is a 32 y.o. female that is presenting with right knee pain. Pain has been present for two weeks. She did a lot of walking she was in [place] and noticed the pain after. Admits to swelling. Pain is localized and anterior in nature. Pain is mild to severe when she extends and flexes her knee. Pain is throbbing when she bears weight on it and walks. Denies tingling or numbness. Denies injury or surgeries.   Independent review of the Right knee x-ray from 5/21 shows a normal exam.   Review of Systems  Constitutional: Negative for fever.  HENT: Negative for congestion.   Respiratory: Negative for cough.   Cardiovascular: Negative for chest pain.  Gastrointestinal: Negative for abdominal pain.  Musculoskeletal: Negative for joint swelling.  Skin: Negative for color change.  Neurological: Negative for weakness.  Hematological: Negative for adenopathy.  Psychiatric/Behavioral: Negative for agitation.    HISTORY: Past Medical, Surgical, Social, and Family History Reviewed & Updated per EMR.   Pertinent Historical Findings include:  Past Medical History:  Diagnosis Date  . Anemia   . Blood transfusion without reported diagnosis 2005  . Morbid obesity ([place])   . Sleep apnea    hx of    Past Surgical History:  Procedure Laterality Date  . BREATH TEK H PYLORI  02/09/2012   Procedure: BREATH TEK H PYLORI;  Surgeon: Pedro Earls, MD;  Location: [facility];  Service: General;  Laterality: N/A;  . GASTRIC BYPASS  08/29/2012  . HERNIA REPAIR  2014    No Known Allergies  Family History  Problem Relation Age of Onset  . Hypertension Father   . Hypertension Mother   . Alcohol abuse Neg Hx   . Arthritis Neg Hx   . Cancer Neg Hx   . Early death Neg Hx   . Heart disease Neg Hx   .  Hyperlipidemia Neg Hx   . Kidney disease Neg Hx   . Stroke Neg Hx      Social History   Socioeconomic History  . Marital status: Single    Spouse name: Not on file  . Number of children: Not on file  . Years of education: Not on file  . Highest education level: Not on file  Occupational History  . Not on file  Social Needs  . Financial resource strain: Not on file  . Food insecurity:    Worry: Not on file    Inability: Not on file  . Transportation needs:    Medical: Not on file    Non-medical: Not on file  Tobacco Use  . Smoking status: Never Smoker  . Smokeless tobacco: Never Used  Substance and Sexual Activity  . Alcohol use: No  . Drug use: No  . Sexual activity: Yes    Birth control/protection: Pill  Lifestyle  . Physical activity:    Days per week: Not on file    Minutes per session: Not on file  . Stress: Not on file  Relationships  . Social connections:    Talks on phone: Not on file    Gets together: Not on file    Attends religious service: Not on file    Active member of club or organization: Not on file    Attends meetings of  clubs or organizations: Not on file    Relationship status: Not on file  . Intimate partner violence:    Fear of current or ex partner: Not on file    Emotionally abused: Not on file    Physically abused: Not on file    Forced sexual activity: Not on file  Other Topics Concern  . Not on file  Social History Narrative  . Not on file     PHYSICAL EXAM:  VS: BP 134/76 (BP Location: Left Arm, Patient Position: Sitting, Cuff Size: Normal)   Pulse 78   Temp 98.6 F (37 C) (Oral)   Ht 5\' 9"  (1.753 m)   Wt (!) 337 lb (152.9 kg)   LMP 11/09/2017   SpO2 98%   BMI 49.77 kg/m  Physical Exam Gen: NAD, alert, cooperative with exam, well-appearing ENT: normal lips, normal nasal mucosa,  Eye: normal EOM, normal conjunctiva and lids CV:  no edema, +2 pedal pulses   Resp: no accessory muscle use, non-labored,  Skin: no rashes,  no areas of induration  Neuro: normal tone, normal sensation to touch Psych:  normal insight, alert and oriented MSK:  Right Knee: Normal to inspection with no erythema or effusion or obvious bony abnormalities. Palpation normal with no warmth, joint line tenderness, patellar tenderness, or condyle tenderness. ROM full in flexion and extension and lower leg rotation. Ligaments with solid consistent endpoints including  LCL, MCL. Negative Mcmurray's Non painful patellar compression. Patellar glide without crepitus. Patellar and quadriceps tendons unremarkable. Hamstring and quadriceps strength is normal.  Neurovascularly intact        ASSESSMENT & PLAN:   Patellofemoral syndrome Pain is likely related to patellofemoral syndrome.  X-rays are reassuring. -[facility] -Counseled on home exercise therapy -If no improvement would consider physical therapy

## 2017-12-03 NOTE — Assessment & Plan Note (Signed)
Pain is likely related to patellofemoral syndrome.  X-rays are reassuring. -[facility] -Counseled on home exercise therapy -If no improvement would consider physical therapy

## 2018-01-20 ENCOUNTER — Encounter: Payer: Self-pay | Admitting: Internal Medicine

## 2018-03-09 ENCOUNTER — Encounter: Payer: Self-pay | Admitting: *Deleted

## 2018-03-09 ENCOUNTER — Other Ambulatory Visit: Payer: Self-pay | Admitting: *Deleted

## 2018-03-09 DIAGNOSIS — Z3041 Encounter for surveillance of contraceptive pills: Secondary | ICD-10-CM

## 2018-03-09 MED ORDER — NORETHINDRONE-ETH ESTRADIOL 1-35 MG-MCG PO TABS: 1.0000 | ORAL_TABLET | Freq: Every day | ORAL | 2 refills | Status: DC

## 2018-03-17 ENCOUNTER — Other Ambulatory Visit (INDEPENDENT_AMBULATORY_CARE_PROVIDER_SITE_OTHER): Payer: BLUE CROSS/BLUE SHIELD

## 2018-03-17 ENCOUNTER — Encounter: Payer: Self-pay | Admitting: Internal Medicine

## 2018-03-17 ENCOUNTER — Ambulatory Visit: Payer: BLUE CROSS/BLUE SHIELD | Admitting: Internal Medicine

## 2018-03-17 VITALS — BP 160/100 | HR 96 | Temp 98.3°F | Ht 69.0 in | Wt 340.0 lb

## 2018-03-17 DIAGNOSIS — D510 Vitamin B12 deficiency anemia due to intrinsic factor deficiency: Secondary | ICD-10-CM

## 2018-03-17 DIAGNOSIS — E559 Vitamin D deficiency, unspecified: Secondary | ICD-10-CM | POA: Diagnosis not present

## 2018-03-17 DIAGNOSIS — R739 Hyperglycemia, unspecified: Secondary | ICD-10-CM

## 2018-03-17 DIAGNOSIS — N943 Premenstrual tension syndrome: Secondary | ICD-10-CM | POA: Insufficient documentation

## 2018-03-17 DIAGNOSIS — E60 Dietary zinc deficiency: Secondary | ICD-10-CM | POA: Diagnosis not present

## 2018-03-17 DIAGNOSIS — D508 Other iron deficiency anemias: Secondary | ICD-10-CM

## 2018-03-17 DIAGNOSIS — E519 Thiamine deficiency, unspecified: Secondary | ICD-10-CM | POA: Diagnosis not present

## 2018-03-17 DIAGNOSIS — I10 Essential (primary) hypertension: Secondary | ICD-10-CM | POA: Diagnosis not present

## 2018-03-17 DIAGNOSIS — D500 Iron deficiency anemia secondary to blood loss (chronic): Secondary | ICD-10-CM

## 2018-03-17 LAB — COMPREHENSIVE METABOLIC PANEL
ALBUMIN: 3.7 g/dL (ref 3.5–5.2)
ALK PHOS: 71 U/L (ref 39–117)
ALT: 11 U/L (ref 0–35)
AST: 12 U/L (ref 0–37)
BUN: 11 mg/dL (ref 6–23)
CALCIUM: 8.4 mg/dL (ref 8.4–10.5)
CHLORIDE: 104 meq/L (ref 96–112)
CO2: 27 mEq/L (ref 19–32)
Creatinine, Ser: 0.72 mg/dL (ref 0.40–1.20)
GFR: 120.2 mL/min (ref >60.00)
Glucose, Bld: 74 mg/dL (ref 70–99)
POTASSIUM: 3.9 meq/L (ref 3.5–5.1)
SODIUM: 139 meq/L (ref 135–145)
TOTAL PROTEIN: 7.1 g/dL (ref 6.0–8.3)
Total Bilirubin: 0.3 mg/dL (ref 0.2–1.2)

## 2018-03-17 LAB — URINALYSIS, ROUTINE W REFLEX MICROSCOPIC
Bilirubin Urine: NEGATIVE
HGB URINE DIPSTICK: NEGATIVE
KETONES UR: NEGATIVE
Nitrite: NEGATIVE
Specific Gravity, Urine: 1.015 (ref 1.000–1.030)
Total Protein, Urine: 30 — AB
URINE GLUCOSE: NEGATIVE
Urobilinogen, UA: 1 (ref 0.0–1.0)
pH: 8.5 — AB (ref 5.0–8.0)

## 2018-03-17 LAB — CBC WITH DIFFERENTIAL/PLATELET
Basophils Absolute: 0 10*3/uL (ref 0.0–0.1)
Basophils Relative: 0.7 % (ref 0.0–3.0)
Eosinophils Absolute: 0.6 10*3/uL (ref 0.0–0.7)
Eosinophils Relative: 9.7 % — ABNORMAL HIGH (ref 0.0–5.0)
HCT: 24 % — ABNORMAL LOW (ref 36.0–46.0)
Lymphocytes Relative: 18 % (ref 12.0–46.0)
Lymphs Abs: 1.1 10*3/uL (ref 0.7–4.0)
MCHC: 29.3 g/dL — ABNORMAL LOW (ref 30.0–36.0)
MCV: 56.4 fl — ABNORMAL LOW (ref 78.0–100.0)
MONO ABS: 0.5 10*3/uL (ref 0.1–1.0)
Monocytes Relative: 8.3 % (ref 3.0–12.0)
Neutro Abs: 4 10*3/uL (ref 1.4–7.7)
Neutrophils Relative %: 63.3 % (ref 43.0–77.0)
Platelets: 269 10*3/uL (ref 150.0–400.0)
RBC: 4.25 Mil/uL (ref 3.87–5.11)
RDW: 21 % — AB (ref 11.5–15.5)
WBC: 6.3 10*3/uL (ref 4.0–10.5)

## 2018-03-17 LAB — HEMOGLOBIN A1C: Hgb A1c MFr Bld: 5.1 % (ref 4.6–6.5)

## 2018-03-17 LAB — FOLATE: Folate: 7.2 ng/mL (ref >5.9)

## 2018-03-17 LAB — IBC PANEL
Iron: 13 ug/dL — ABNORMAL LOW (ref 42–145)
SATURATION RATIOS: 1.8 % — AB (ref 20.0–50.0)
Transferrin: 527 mg/dL — ABNORMAL HIGH (ref 212.0–360.0)

## 2018-03-17 LAB — FERRITIN: Ferritin: 1.9 ng/mL — ABNORMAL LOW (ref 10.0–291.0)

## 2018-03-17 LAB — VITAMIN B12: VITAMIN B 12: 79 pg/mL — AB (ref 211–911)

## 2018-03-17 LAB — TSH: TSH: 1.65 u[IU]/mL (ref 0.35–4.50)

## 2018-03-17 LAB — VITAMIN D 25 HYDROXY (VIT D DEFICIENCY, FRACTURES): VITD: 5.2 ng/mL — AB (ref 30.00–100.00)

## 2018-03-17 MED ORDER — TRIAMTERENE-HCTZ 37.5-25 MG PO CAPS: 1.0000 | ORAL_CAPSULE | Freq: Every day | ORAL | 0 refills | Status: DC

## 2018-03-17 MED ORDER — FERROUS SULFATE 325 (65 FE) MG PO TABS: 325.0000 mg | ORAL_TABLET | Freq: Three times a day (TID) | ORAL | 1 refills | Status: DC

## 2018-03-17 MED ORDER — FLUOXETINE HCL 10 MG PO TABS: 10.0000 mg | ORAL_TABLET | Freq: Every day | ORAL | 0 refills | Status: DC

## 2018-03-17 MED ORDER — CHOLECALCIFEROL 1.25 MG (50000 UT) PO CAPS: 50000.0000 [IU] | ORAL_CAPSULE | ORAL | 1 refills | Status: DC

## 2018-03-17 NOTE — Patient Instructions (Signed)

## 2018-03-17 NOTE — Progress Notes (Signed)
Subjective:  Patient ID: [name], female    DOB: [birth year]  Age: 32 y.o. MRN: [id number]  CC: Anemia and Hypertension   HPI Tiny Chaudhary presents for f/up -  1.  She complains of fatigue.  She has a history of anemia with vitamin deficiency status post bariatric surgery.  She is not currently taking any vitamin supplements. 2.  She tells me her blood pressure has not been well controlled and she has had some headaches recently.  She denies chest pain, shortness of breath, palpitations, edema, or syncope. 3.  She complains of perimenstrual symptoms that include worsening headaches, menstrual cramps, fatigue, and irritability.  Outpatient Medications Prior to Visit  Medication Sig Dispense Refill  . Diclofenac Sodium (PENNSAID) 2 % SOLN Place 1 application onto the skin 2 (two) times daily. 1 Bottle 3  . norethindrone-ethinyl estradiol 1/35 (ORTHO-NOVUM, NORTREL,CYCLAFEM) tablet Take 1 tablet by mouth daily. 1 Package 2  . thiamine (VITAMIN B-1) 100 MG tablet Take 1 tablet (100 mg total) by mouth daily. 90 tablet 1  . zinc gluconate 50 MG tablet Take 1 tablet (50 mg total) by mouth daily. 90 tablet 1  . Cholecalciferol 2000 units TABS Take 2 tablets (4,000 Units total) by mouth daily. 180 tablet 1  . cyanocobalamin 2000 MCG tablet Take 1 tablet (2,000 mcg total) by mouth daily. 90 tablet 1  . ferrous sulfate 325 (65 FE) MG tablet Take 1 tablet (325 mg total) by mouth 3 (three) times daily with meals. 270 tablet 1  . Naproxen-Esomeprazole 500-20 MG TBEC Take 1 tablet by mouth 2 (two) times daily. 60 tablet 3  . phentermine 37.5 MG capsule Take 1 capsule (37.5 mg total) by mouth every morning. (Patient not taking: Reported on 03/17/2018) 30 capsule 2   No facility-administered medications prior to visit.     ROS Review of Systems  Constitutional: Positive for fatigue and unexpected weight change (wt gain). Negative for activity change, appetite change and  diaphoresis.  HENT: Negative.  Negative for trouble swallowing and voice change.   Eyes: Negative for visual disturbance.  Respiratory: Negative for apnea, cough, chest tightness, shortness of breath and wheezing.   Cardiovascular: Negative for chest pain, palpitations and leg swelling.  Gastrointestinal: Negative for abdominal pain.  Endocrine: Negative for cold intolerance and heat intolerance.  Genitourinary: Positive for menstrual problem. Negative for difficulty urinating, dysuria and flank pain.  Musculoskeletal: Negative.  Negative for arthralgias and myalgias.  Skin: Positive for pallor. Negative for color change.  Neurological: Positive for headaches. Negative for dizziness and weakness.  Hematological: Negative.  Negative for adenopathy. Does not bruise/bleed easily.  Psychiatric/Behavioral: Positive for dysphoric mood. Negative for confusion, decreased concentration, sleep disturbance and suicidal ideas. The patient is not nervous/anxious.     Objective:  BP (!) 160/100 (BP Location: Left Arm, Patient Position: Sitting, Cuff Size: Large)   Pulse 96   Temp 98.3 F (36.8 C) (Oral)   Ht 5\' 9"  (1.753 m)   Wt (!) 340 lb (154.2 kg)   LMP 03/04/2018 (Within Days)   SpO2 96%   BMI 50.21 kg/m   BP Readings from Last 3 Encounters:  03/17/18 (!) 160/100  12/02/17 134/76  11/23/17 (!) 154/96    Wt Readings from Last 3 Encounters:  03/17/18 (!) 340 lb (154.2 kg)  12/02/17 (!) 337 lb (152.9 kg)  11/23/17 (!) 336 lb (152.4 kg)    Physical Exam  Constitutional: She is oriented to person, place, and time. She appears  well-developed and well-nourished. No distress.  HENT:  Mouth/Throat: Mucous membranes are pale and not dry. No oropharyngeal exudate.  Eyes: No scleral icterus.  Neck: Normal range of motion. Neck supple. No JVD present. No thyromegaly present.  Cardiovascular: Normal rate, regular rhythm and normal heart sounds. Exam reveals no gallop and no friction rub.  No  murmur heard. EKG ----  Sinus  Rhythm  WITHIN NORMAL LIMITS  Pulmonary/Chest: Effort normal and breath sounds normal. No respiratory distress. She has no wheezes. She has no rales.  Abdominal: Soft. Normal appearance and bowel sounds are normal. She exhibits no mass. There is no hepatosplenomegaly. There is no tenderness. No hernia.  Musculoskeletal: Normal range of motion. She exhibits no edema, tenderness or deformity.  Lymphadenopathy:    She has no cervical adenopathy.  Neurological: She is alert and oriented to person, place, and time.  Skin: Skin is warm and dry. She is not diaphoretic. No pallor.  Psychiatric: She has a normal mood and affect. Her behavior is normal. Judgment and thought content normal.  Vitals reviewed.   Lab Results  Component Value Date   WBC 6.3 03/17/2018   HGB 7.0 Repeated and verified X2. (LL) 03/17/2018   HCT 24.0 Repeated and verified X2. (L) 03/17/2018   PLT 269.0 03/17/2018   GLUCOSE 74 03/17/2018   CHOL 162 07/28/2017   TRIG 169.0 (H) 07/28/2017   HDL 64.50 07/28/2017   LDLCALC 64 07/28/2017   ALT 11 03/17/2018   AST 12 03/17/2018   NA 139 03/17/2018   K 3.9 03/17/2018   CL 104 03/17/2018   CREATININE 0.72 03/17/2018   BUN 11 03/17/2018   CO2 27 03/17/2018   TSH 1.65 03/17/2018   HGBA1C 5.1 03/17/2018    Dg Knee Complete 4 Views Right  Result Date: 11/23/2017 CLINICAL DATA:  Anterior right knee pain for several days, no known injury, initial encounter EXAM: RIGHT KNEE - COMPLETE 4+ VIEW COMPARISON:  None. FINDINGS: Patellofemoral degenerative changes are noted. No acute joint effusion is seen. No fracture or dislocation is seen. IMPRESSION: Degenerative change without acute abnormality. Electronically Signed   By: Inez Catalina M.D.   On: 11/23/2017 16:57    Assessment & Plan:   Porschia was seen today for anemia and hypertension.  Diagnoses and all orders for this visit:  Chronic zinc deficiency-I will monitor her zinc level and  will replete this if needed. -     Zinc; Future  Vitamin B12 deficiency anemia due to intrinsic factor deficiency-she is anemic and her B12 level is low.  I have asked to return for a B12 injection. -     CBC with Differential/Platelet; Future -     Folate; Future -     Vitamin B12; Future  Iron deficiency anemia secondary to inadequate dietary iron intake- She is anemic and has a very low iron level.  I have asked her to restart an iron supplement but have also asked her to see hematology to see if she would benefit from an iron infusion. -     CBC with Differential/Platelet; Future -     IBC panel; Future -     Ferritin; Future -     Ambulatory referral to Hematology  Thiamine deficiency- I will monitor her vitamin B1 level. -     Vitamin B1; Future  Vitamin D deficiency- Her vitamin D level is low.  Will start a vitamin D supplement. -     VITAMIN D 25 Hydroxy (Vit-D Deficiency, Fractures);  Future -     Cholecalciferol 50000 units capsule; Take 1 capsule (50,000 Units total) by mouth once a week.  Essential hypertension- She has developed stage II hypertension and she is symptomatic.  Her EKG is negative for LVH.  I will treat the vitamin D deficiency.  The rest of her labs are negative for secondary causes or endorgan damage.  Will start treating this with a combination of triamterene and hydrochlorothiazide. -     Urinalysis, Routine w reflex microscopic; Future -     TSH; Future -     Comprehensive metabolic panel; Future -     EKG 12-Lead -     triamterene-hydrochlorothiazide (DYAZIDE) 37.5-25 MG capsule; Take 1 each (1 capsule total) by mouth daily.  Hyperglycemia -     Hemoglobin A1c; Future  PMS (premenstrual syndrome) -     FLUoxetine (PROZAC) 10 MG tablet; Take 1 tablet (10 mg total) by mouth daily.  Iron deficiency anemia due to chronic blood loss -     ferrous sulfate 325 (65 FE) MG tablet; Take 1 tablet (325 mg total) by mouth 3 (three) times daily with meals.   I  have discontinued Araminta R. Robeck's phentermine, cyanocobalamin, Cholecalciferol, and Naproxen-Esomeprazole. I am also having her start on FLUoxetine, Cholecalciferol, and triamterene-hydrochlorothiazide. Additionally, I am having her maintain her zinc gluconate, thiamine, Diclofenac Sodium, norethindrone-ethinyl estradiol 1/35, and ferrous sulfate.  Meds ordered this encounter  Medications  . FLUoxetine (PROZAC) 10 MG tablet    Sig: Take 1 tablet (10 mg total) by mouth daily.    Dispense:  90 tablet    Refill:  0  . Cholecalciferol 50000 units capsule    Sig: Take 1 capsule (50,000 Units total) by mouth once a week.    Dispense:  12 capsule    Refill:  1  . ferrous sulfate 325 (65 FE) MG tablet    Sig: Take 1 tablet (325 mg total) by mouth 3 (three) times daily with meals.    Dispense:  270 tablet    Refill:  1  . triamterene-hydrochlorothiazide (DYAZIDE) 37.5-25 MG capsule    Sig: Take 1 each (1 capsule total) by mouth daily.    Dispense:  90 capsule    Refill:  0     Follow-up: Return in about 4 weeks (around 04/14/2018).  Scarlette Calico, MD

## 2018-03-18 ENCOUNTER — Other Ambulatory Visit: Payer: BLUE CROSS/BLUE SHIELD

## 2018-03-18 DIAGNOSIS — E60 Dietary zinc deficiency: Secondary | ICD-10-CM

## 2018-03-18 LAB — TIQ-NTM

## 2018-03-21 LAB — ZINC

## 2018-03-21 LAB — VITAMIN B1: Vitamin B1 (Thiamine): 10 nmol/L (ref 8–30)

## 2018-03-22 NOTE — Telephone Encounter (Signed)
I tried to call pt about B12 and her FMLA paper work. I reviewed the last my chart message that I sent and she still has not seen it yet.

## 2018-03-23 ENCOUNTER — Ambulatory Visit: Payer: BLUE CROSS/BLUE SHIELD

## 2018-03-23 NOTE — Telephone Encounter (Signed)
Called patient her VM box is full and unable to leave a message.

## 2018-03-25 NOTE — Telephone Encounter (Signed)
Spoke with patient, She stated this issues started in May, I have added this to the paperwork and given back to [facility] to have provider sign.

## 2018-03-25 NOTE — Telephone Encounter (Signed)
Patient calling back in regard to [facility].  Please follow up in regard.

## 2018-03-27 DIAGNOSIS — Z0279 Encounter for issue of other medical certificate: Secondary | ICD-10-CM

## 2018-03-28 NOTE — Telephone Encounter (Signed)
Forms have been signed, Faxed to [facility] @ [phone], Copy sent to scan & charged for.   Original mailed to patient.

## 2018-04-14 ENCOUNTER — Other Ambulatory Visit: Payer: Self-pay | Admitting: Hematology

## 2018-04-14 DIAGNOSIS — Z9884 Bariatric surgery status: Secondary | ICD-10-CM

## 2018-04-14 DIAGNOSIS — E538 Deficiency of other specified B group vitamins: Secondary | ICD-10-CM

## 2018-04-14 DIAGNOSIS — D508 Other iron deficiency anemias: Secondary | ICD-10-CM

## 2018-04-14 NOTE — Progress Notes (Deleted)
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  -The patient is symptomatic from anemia and the iron level is critically low; as such, oral supplement is not sufficient to replete iron storage quickly and she will need IV iron to higher levels of iron faster for adequate hematopoesis.  -Some of the side-effects to be expected including risks of infusion reactions, phlebitis, headaches, nausea and fatigue.   -The patient is willing to proceed.  -Goal is to keep ferritin level greater than 50.  Chronic B12 deficiency -Secondary to malabsorption from gastric bypass -Last IM B12 injection January 2019 -Patient will require monthly B12 injection due to poor oral absorption -We will set up the patient for monthly B12 injection clinic    No orders of the defined types were placed in this encounter.   All questions were answered. The patient knows to call the clinic with any problems, questions or concerns.  A total of more than {CHL ONC TIME VISIT - ZYSAY:[id number]} were spent face-to-face with the patient during this encounter and over half of that time was spent on counseling and coordination of care as outlined above.  Tish Men, MD 04/14/18 11:12 AM   CHIEF COMPLAINTS/PURPOSE OF CONSULTATION:  "I am here for my low red blood cell count"  HISTORY OF PRESENTING ILLNESS:  [name] [place] 32 y.o. female is here because of ***  She was found to  have abnormal CBC from *** She denies recent chest pain on exertion, shortness of breath on minimal exertion, pre-syncopal episodes, or palpitations. She had not noticed any recent bleeding such as epistaxis, hematuria or hematochezia The patient denies over the counter NSAID ingestion. She is not *** on antiplatelets agents. Her last colonoscopy was *** She had no prior history or diagnosis of cancer. Her age appropriate screening programs are up-to-date. She denies any pica and eats a variety of diet. She never donated blood or received blood transfusion The patient was prescribed oral iron supplements and she takes ***  MEDICAL HISTORY:  Past Medical History:  Diagnosis Date  . Anemia   . Blood transfusion without reported diagnosis 2005  . Morbid obesity ([place])   . Sleep apnea    hx of    SURGICAL HISTORY: Past Surgical History:  Procedure Laterality Date  . BREATH TEK H PYLORI  02/09/2012   Procedure: BREATH TEK H PYLORI;  Surgeon: Pedro Earls, MD;  Location: [facility];  Service: General;  Laterality: N/A;  . GASTRIC BYPASS  08/29/2012  . HERNIA REPAIR  2014    SOCIAL HISTORY: Social History   Socioeconomic History  . Marital status: Single    Spouse name: Not on file  . Number of children: Not on file  . Years of education: Not on file  . Highest education level: Not on file  Occupational History  . Not on file  Social Needs  . Financial resource strain: Not on file  . Food insecurity:    Worry: Not on file  Inability: Not on file  . Transportation needs:    Medical: Not on file    Non-medical: Not on file  Tobacco Use  . Smoking status: Never Smoker  . Smokeless tobacco: Never Used  Substance and Sexual Activity  . Alcohol use: No  . Drug use: No  . Sexual activity: Yes    Birth control/protection: Pill  Lifestyle  . Physical activity:    Days per week: Not on file    Minutes per session: Not on file  . Stress: Not on file  Relationships  .  Social connections:    Talks on phone: Not on file    Gets together: Not on file    Attends religious service: Not on file    Active member of club or organization: Not on file    Attends meetings of clubs or organizations: Not on file    Relationship status: Not on file  . Intimate partner violence:    Fear of current or ex partner: Not on file    Emotionally abused: Not on file    Physically abused: Not on file    Forced sexual activity: Not on file  Other Topics Concern  . Not on file  Social History Narrative  . Not on file    FAMILY HISTORY: Family History  Problem Relation Age of Onset  . Hypertension Father   . Hypertension Mother   . Alcohol abuse Neg Hx   . Arthritis Neg Hx   . Cancer Neg Hx   . Early death Neg Hx   . Heart disease Neg Hx   . Hyperlipidemia Neg Hx   . Kidney disease Neg Hx   . Stroke Neg Hx     ALLERGIES:   has No Known Allergies.  MEDICATIONS:  Current Outpatient Medications  Medication Sig Dispense Refill  . Cholecalciferol 50000 units capsule Take 1 capsule (50,000 Units total) by mouth once a week. 12 capsule 1  . Diclofenac Sodium (PENNSAID) 2 % SOLN Place 1 application onto the skin 2 (two) times daily. 1 Bottle 3  . ferrous sulfate 325 (65 FE) MG tablet Take 1 tablet (325 mg total) by mouth 3 (three) times daily with meals. 270 tablet 1  . FLUoxetine (PROZAC) 10 MG tablet Take 1 tablet (10 mg total) by mouth daily. 90 tablet 0  . norethindrone-ethinyl estradiol 1/35 (ORTHO-NOVUM, NORTREL,CYCLAFEM) tablet Take 1 tablet by mouth daily. 1 Package 2  . thiamine (VITAMIN B-1) 100 MG tablet Take 1 tablet (100 mg total) by mouth daily. 90 tablet 1  . triamterene-hydrochlorothiazide (DYAZIDE) 37.5-25 MG capsule Take 1 each (1 capsule total) by mouth daily. 90 capsule 0  . zinc gluconate 50 MG tablet Take 1 tablet (50 mg total) by mouth daily. 90 tablet 1   No current facility-administered medications for this visit.     REVIEW OF SYSTEMS:    Constitutional: ( - ) fevers, ( - )  chills , ( - ) night sweats Eyes: ( - ) blurriness of vision, ( - ) double vision, ( - ) watery eyes Ears, nose, mouth, throat, and face: ( - ) mucositis, ( - ) sore throat Respiratory: ( - ) cough, ( - ) dyspnea, ( - ) wheezes Cardiovascular: ( - ) palpitation, ( - ) chest discomfort, ( - ) lower extremity swelling Gastrointestinal:  ( - ) nausea, ( - ) heartburn, ( - ) change in bowel habits Skin: ( - ) abnormal skin rashes Lymphatics: ( - ) new  lymphadenopathy, ( - ) easy bruising Neurological: ( - ) numbness, ( - ) tingling, ( - ) new weaknesses Behavioral/Psych: ( - ) mood change, ( - ) new changes  All other systems were reviewed with the patient and are negative.  PHYSICAL EXAMINATION: ECOG PERFORMANCE STATUS: {CHL ONC ECOG Ps:[phone]}  There were no vitals filed for this visit. There were no vitals filed for this visit.  GENERAL: alert, no distress and comfortable SKIN: skin color, texture, turgor are normal, no rashes or significant lesions EYES: conjunctiva are pink and non-injected, sclera clear OROPHARYNX: no exudate, no erythema; lips, buccal mucosa, and tongue normal  NECK: supple, non-tender LYMPH:  no palpable lymphadenopathy in the cervical or axillary  LUNGS: clear to auscultation and percussion with normal breathing effort HEART: regular rate & rhythm and no murmurs and no lower extremity edema ABDOMEN: soft, non-tender, non-distended, normal bowel sounds Musculoskeletal: no cyanosis of digits and no clubbing  PSYCH: alert & oriented x 3, fluent speech NEURO: no focal motor/sensory deficits  LABORATORY DATA:  I have reviewed the data as listed Lab Results  Component Value Date   WBC 6.3 03/17/2018   HGB 7.0 Repeated and verified X2. (LL) 03/17/2018   HCT 24.0 Repeated and verified X2. (L) 03/17/2018   MCV 56.4 Repeated and verified X2. (L) 03/17/2018   PLT 269.0 03/17/2018   Recent Labs    03/17/18 0941  NA 139   K 3.9  CL 104  CO2 27  GLUCOSE 74  BUN 11  CREATININE 0.72  CALCIUM 8.4  PROT 7.1  ALBUMIN 3.7  AST 12  ALT 11  ALKPHOS 71  BILITOT 0.3   ***I personally reviewed the patient's peripheral blood smear today.  There was no peripheral blast.  The white blood cells and red blood cells were of normal morphology. There was no schistocytosis or anisocytosis.  The platelets are of normal size and I have verified that there were no platelet clumping.  RADIOGRAPHIC STUDIES: I have personally reviewed the radiological images as listed and agreed with the findings in the report. No results found.

## 2018-04-17 ENCOUNTER — Other Ambulatory Visit: Payer: Self-pay | Admitting: Hematology

## 2018-04-17 DIAGNOSIS — D508 Other iron deficiency anemias: Secondary | ICD-10-CM

## 2018-04-18 ENCOUNTER — Ambulatory Visit: Payer: BLUE CROSS/BLUE SHIELD | Admitting: Hematology

## 2018-04-18 ENCOUNTER — Other Ambulatory Visit: Payer: BLUE CROSS/BLUE SHIELD

## 2018-04-18 ENCOUNTER — Ambulatory Visit: Payer: BLUE CROSS/BLUE SHIELD

## 2018-06-15 ENCOUNTER — Other Ambulatory Visit: Payer: Self-pay | Admitting: Obstetrics & Gynecology

## 2018-06-15 ENCOUNTER — Other Ambulatory Visit: Payer: Self-pay | Admitting: Internal Medicine

## 2018-06-15 DIAGNOSIS — Z3041 Encounter for surveillance of contraceptive pills: Secondary | ICD-10-CM

## 2018-06-20 ENCOUNTER — Telehealth: Payer: Self-pay | Admitting: Internal Medicine

## 2018-06-20 NOTE — Telephone Encounter (Signed)
Copied from [place] [id number]. Topic: Quick Communication - See Telephone Encounter >> Jun 20, 2018  2:43 PM Rayann Heman wrote: CRM for notification. See Telephone encounter for: 06/20/18. Pt called and stated that fmla paperwork needs to be changed. Pt stated that she would like time approved to be only 40 hours a month instead of 5 days a month. Please advise FMLA [phone] claim# [id number]

## 2018-06-20 NOTE — Telephone Encounter (Signed)
Stef you filled this out on 09/22.

## 2018-06-21 NOTE — Telephone Encounter (Signed)
This has been corrected and faxed back.

## 2018-07-26 ENCOUNTER — Other Ambulatory Visit: Payer: Self-pay | Admitting: Dermatology

## 2018-07-26 DIAGNOSIS — D485 Neoplasm of uncertain behavior of skin: Secondary | ICD-10-CM | POA: Diagnosis not present

## 2018-07-26 DIAGNOSIS — L309 Dermatitis, unspecified: Secondary | ICD-10-CM | POA: Diagnosis not present

## 2018-10-20 ENCOUNTER — Ambulatory Visit (INDEPENDENT_AMBULATORY_CARE_PROVIDER_SITE_OTHER): Payer: BLUE CROSS/BLUE SHIELD | Admitting: Internal Medicine

## 2018-10-20 ENCOUNTER — Encounter: Payer: Self-pay | Admitting: Internal Medicine

## 2018-10-20 DIAGNOSIS — E519 Thiamine deficiency, unspecified: Secondary | ICD-10-CM

## 2018-10-20 DIAGNOSIS — E60 Dietary zinc deficiency: Secondary | ICD-10-CM

## 2018-10-20 DIAGNOSIS — Z6841 Body Mass Index (BMI) 40.0 and over, adult: Secondary | ICD-10-CM

## 2018-10-20 DIAGNOSIS — D508 Other iron deficiency anemias: Secondary | ICD-10-CM

## 2018-10-20 DIAGNOSIS — I10 Essential (primary) hypertension: Secondary | ICD-10-CM

## 2018-10-20 DIAGNOSIS — E538 Deficiency of other specified B group vitamins: Secondary | ICD-10-CM | POA: Diagnosis not present

## 2018-10-20 MED ORDER — PHENTERMINE HCL 37.5 MG PO CAPS: 37.5000 mg | ORAL_CAPSULE | ORAL | 0 refills | Status: DC

## 2018-10-20 NOTE — Progress Notes (Signed)
Virtual Visit via Video Note  I connected with [name] on 10/24/18 at  3:30 PM EDT by a video enabled telemedicine application and verified that I am speaking with the correct person using two identifiers.   I discussed the limitations of evaluation and management by telemedicine and the availability of in person appointments. The patient expressed understanding and agreed to proceed.  History of Present Illness: She checked in for a virtual visit.  She was not willing to come in in person due to the the COVID-19 pandemic.  She complains that she has been eating too much recently and has been gaining weight.  She wants to try an appetite suppressant.  She is concerned that she may be suffering from vitamin deficiencies because she is not taking all of her vitamin supplements.  She denies fatigue, shortness of breath, paresthesias, blood loss, nausea, vomiting, or abdominal pain.    Observations/Objective: She appeared obese but in no acute distress.  She was calm, cooperative, and appropriate.  Lab Results  Component Value Date   WBC 6.3 03/17/2018   HGB 7.0 Repeated and verified X2. (LL) 03/17/2018   HCT 24.0 Repeated and verified X2. (L) 03/17/2018   PLT 269.0 03/17/2018   GLUCOSE 74 03/17/2018   CHOL 162 07/28/2017   TRIG 169.0 (H) 07/28/2017   HDL 64.50 07/28/2017   LDLCALC 64 07/28/2017   ALT 11 03/17/2018   AST 12 03/17/2018   NA 139 03/17/2018   K 3.9 03/17/2018   CL 104 03/17/2018   CREATININE 0.72 03/17/2018   BUN 11 03/17/2018   CO2 27 03/17/2018   TSH 1.65 03/17/2018   HGBA1C 5.1 03/17/2018     Assessment and Plan: She will start taking phentermine to help her decrease her caloric intake and to help her lose weight.  She agrees to come in to have her hemoglobin, hematocrit, and vitamin levels rechecked.     Follow Up Instructions: She will let me know if she develops any new or worsening symptoms.  She agrees to comply with the lab orders.  In addition  to taking the phentermine she will increase her activity level and will try to decrease her caloric intake.  She agrees to follow-up in person in about a month or 2.      I discussed the assessment and treatment plan with the patient. The patient was provided an opportunity to ask questions and all were answered. The patient agreed with the plan and demonstrated an understanding of the instructions.   The patient was advised to call back or seek an in-person evaluation if the symptoms worsen or if the condition fails to improve as anticipated.  I provided 25 minutes of non-face-to-face time during this encounter.   Scarlette Calico, MD

## 2018-10-24 ENCOUNTER — Encounter: Payer: Self-pay | Admitting: Internal Medicine

## 2019-02-01 ENCOUNTER — Ambulatory Visit (HOSPITAL_COMMUNITY)
Admission: EM | Admit: 2019-02-01 | Discharge: 2019-02-01 | Disposition: A | Discharge status: Home / Self Care | Payer: BC Managed Care – PPO | Attending: Internal Medicine | Admitting: Internal Medicine

## 2019-02-01 ENCOUNTER — Other Ambulatory Visit: Payer: Self-pay

## 2019-02-01 ENCOUNTER — Encounter (HOSPITAL_COMMUNITY): Payer: Self-pay

## 2019-02-01 DIAGNOSIS — M542 Cervicalgia: Secondary | ICD-10-CM | POA: Diagnosis not present

## 2019-02-01 DIAGNOSIS — M549 Dorsalgia, unspecified: Secondary | ICD-10-CM | POA: Diagnosis not present

## 2019-02-01 DIAGNOSIS — M25562 Pain in left knee: Secondary | ICD-10-CM | POA: Diagnosis not present

## 2019-02-01 MED ORDER — NAPROXEN 375 MG PO TABS: 375.0000 mg | ORAL_TABLET | Freq: Two times a day (BID) | ORAL | 0 refills | Status: DC

## 2019-02-01 MED ORDER — CYCLOBENZAPRINE HCL 10 MG PO TABS: 10.0000 mg | ORAL_TABLET | Freq: Three times a day (TID) | ORAL | 0 refills | Status: DC | PRN
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
## 2019-08-14 DIAGNOSIS — L309 Dermatitis, unspecified: Secondary | ICD-10-CM | POA: Diagnosis not present

## 2019-09-16 ENCOUNTER — Other Ambulatory Visit: Payer: Self-pay | Admitting: Obstetrics & Gynecology

## 2019-09-16 DIAGNOSIS — Z3041 Encounter for surveillance of contraceptive pills: Secondary | ICD-10-CM

## 2019-12-12 ENCOUNTER — Other Ambulatory Visit: Payer: Self-pay

## 2019-12-12 ENCOUNTER — Encounter: Payer: Self-pay | Admitting: Dermatology

## 2019-12-12 ENCOUNTER — Ambulatory Visit: Payer: BC Managed Care – PPO | Admitting: Dermatology

## 2019-12-12 DIAGNOSIS — L2084 Intrinsic (allergic) eczema: Secondary | ICD-10-CM | POA: Diagnosis not present

## 2019-12-12 DIAGNOSIS — L209 Atopic dermatitis, unspecified: Secondary | ICD-10-CM

## 2019-12-12 MED ORDER — TACROLIMUS 0.1 % EX OINT: TOPICAL_OINTMENT | Freq: Every day | CUTANEOUS | 2 refills | Status: DC

## 2019-12-12 NOTE — Progress Notes (Signed)
Patient given [place] brochure and copay card.

## 2020-01-03 ENCOUNTER — Telehealth: Payer: Self-pay

## 2020-01-03 ENCOUNTER — Other Ambulatory Visit: Payer: Self-pay | Admitting: Dermatology

## 2020-01-03 DIAGNOSIS — L209 Atopic dermatitis, unspecified: Secondary | ICD-10-CM

## 2020-01-03 NOTE — Telephone Encounter (Signed)
Prior authorization done through [employer] [facility] for patient's Tacrolimus 0.1% ointment. Prior authorization approved.  Patient's pharmacy aware.    Approved today Your PA request has been approved. Additional information will be provided in the approval communication. (Message 1145)

## 2020-01-03 NOTE — Telephone Encounter (Signed)
Fax received from patient's Pharmacy for a prior authorization for Tacrolimus 0.1% Ointment.

## 2020-01-07 ENCOUNTER — Encounter: Payer: Self-pay | Admitting: Dermatology

## 2020-01-07 NOTE — Progress Notes (Signed)
   Follow-Up Visit   Subjective  [name] is a 34 y.o. 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 Per patient the Minocycline and Mupirocin did help some of the areas heal. Patient states that she has had new spots show up some on her breast and abdomen.).  Eczema Location: Torso more than extremities Duration: Several years Quality: Modestly improved Associated Signs/Symptoms: Severe pruritus Modifying Factors: Topical steroids Severity:  Timing: Context:   The following portions of the chart were reviewed this encounter and updated as appropriate:     Objective  Well appearing patient in no apparent distress; mood and affect are within normal limits.  A focused examination was performed including Head, neck, chest, abdomen, back, arms.  Also nails and mucosa eyes and mouth.. Relevant physical exam findings are noted in the Assessment and Plan.   Assessment & Plan  Atopic dermatitis, unspecified type  Other Related Medications tacrolimus (PROTOPIC) 0.1 % ointment  Intrinsic eczema (6) Left Breast; Right Breast; Right Abdomen (side) - Upper; Left Abdomen (side) - Lower; Left Upper Back; Right Upper Back  Potential candidate for Dupixent therapy; we will proceed to do mandatory paperwork.  Prescription given for him to crawl on this which insurance may require she try before approving Dupixent.  Follow-up in 6 weeks.

## 2020-01-09 ENCOUNTER — Telehealth: Payer: Self-pay

## 2020-01-09 NOTE — Telephone Encounter (Signed)
Phone call to patient to inform her that we need her to come by the office and sign one spot on the [place] form.  Left message for patient to give the office a call back.

## 2020-01-11 NOTE — Telephone Encounter (Signed)
Phone call to patient to inform her that we need her to come by the office to sign the [place] [employer] form.  Voicemail left for patient to give the office a call back.

## 2020-01-17 NOTE — Telephone Encounter (Signed)
Phone call to patient regarding her needing to come by the office to sign the [place] [employer] paperwork.  Voicemail left for patient to give the office a call back.

## 2020-01-18 NOTE — Telephone Encounter (Signed)
After three attempts to reach patient regarding paperwork patient still hasn't given the office a call back.

## 2020-01-24 ENCOUNTER — Ambulatory Visit: Payer: BC Managed Care – PPO | Admitting: Dermatology

## 2020-02-06 ENCOUNTER — Telehealth: Payer: Self-pay | Admitting: *Deleted

## 2020-02-06 NOTE — Telephone Encounter (Signed)
Prior authorization done via cover my meds for [place] has been faxed to the plan as a paper copy. Please contact the plan directly if you haven't received a determination in a typical timeframe.  You will be notified of the determination electronically and via fax.

## 2020-02-20 ENCOUNTER — Other Ambulatory Visit: Payer: Self-pay | Admitting: Dermatology

## 2020-02-20 NOTE — Telephone Encounter (Signed)
Insurance waiting on medical records of tried an failed drugs faxed today. Waiting on Dupixent approval patient is aware.

## 2020-02-20 NOTE — Telephone Encounter (Signed)
Patient says that [facility] told her to call us concerning her medication refill.  Patient says that she is having trouble getting a refill on Dupixent.

## 2020-03-13 ENCOUNTER — Encounter (HOSPITAL_COMMUNITY): Payer: Self-pay

## 2020-03-13 ENCOUNTER — Ambulatory Visit (HOSPITAL_COMMUNITY)
Admission: EM | Admit: 2020-03-13 | Discharge: 2020-03-13 | Payer: BC Managed Care – PPO | Attending: Urgent Care | Admitting: Urgent Care

## 2020-03-13 ENCOUNTER — Emergency Department (HOSPITAL_COMMUNITY): Payer: BC Managed Care – PPO

## 2020-03-13 ENCOUNTER — Inpatient Hospital Stay (HOSPITAL_COMMUNITY)
Admission: EM | Admit: 2020-03-13 | Discharge: 2020-03-17 | DRG: 177 | Disposition: A | Discharge status: Home / Self Care | Payer: BC Managed Care – PPO | Attending: Internal Medicine | Admitting: Internal Medicine

## 2020-03-13 ENCOUNTER — Other Ambulatory Visit: Payer: Self-pay

## 2020-03-13 DIAGNOSIS — G4730 Sleep apnea, unspecified: Secondary | ICD-10-CM | POA: Diagnosis present

## 2020-03-13 DIAGNOSIS — D509 Iron deficiency anemia, unspecified: Secondary | ICD-10-CM | POA: Diagnosis present

## 2020-03-13 DIAGNOSIS — J9601 Acute respiratory failure with hypoxia: Secondary | ICD-10-CM | POA: Diagnosis not present

## 2020-03-13 DIAGNOSIS — J1282 Pneumonia due to coronavirus disease 2019: Secondary | ICD-10-CM | POA: Diagnosis not present

## 2020-03-13 DIAGNOSIS — R0902 Hypoxemia: Secondary | ICD-10-CM | POA: Diagnosis not present

## 2020-03-13 DIAGNOSIS — Z6841 Body Mass Index (BMI) 40.0 and over, adult: Secondary | ICD-10-CM

## 2020-03-13 DIAGNOSIS — U071 COVID-19: Secondary | ICD-10-CM | POA: Diagnosis not present

## 2020-03-13 DIAGNOSIS — N920 Excessive and frequent menstruation with regular cycle: Secondary | ICD-10-CM | POA: Diagnosis present

## 2020-03-13 DIAGNOSIS — Z79899 Other long term (current) drug therapy: Secondary | ICD-10-CM | POA: Diagnosis not present

## 2020-03-13 DIAGNOSIS — I10 Essential (primary) hypertension: Secondary | ICD-10-CM | POA: Diagnosis present

## 2020-03-13 DIAGNOSIS — Z8249 Family history of ischemic heart disease and other diseases of the circulatory system: Secondary | ICD-10-CM

## 2020-03-13 DIAGNOSIS — Z283 Underimmunization status: Secondary | ICD-10-CM

## 2020-03-13 DIAGNOSIS — R069 Unspecified abnormalities of breathing: Secondary | ICD-10-CM | POA: Diagnosis not present

## 2020-03-13 DIAGNOSIS — R0602 Shortness of breath: Secondary | ICD-10-CM | POA: Diagnosis not present

## 2020-03-13 DIAGNOSIS — A0839 Other viral enteritis: Secondary | ICD-10-CM | POA: Diagnosis not present

## 2020-03-13 DIAGNOSIS — Z9884 Bariatric surgery status: Secondary | ICD-10-CM | POA: Diagnosis not present

## 2020-03-13 DIAGNOSIS — R0600 Dyspnea, unspecified: Secondary | ICD-10-CM | POA: Diagnosis not present

## 2020-03-13 LAB — CBC WITH DIFFERENTIAL/PLATELET
Abs Immature Granulocytes: 0.03 10*3/uL (ref 0.00–0.07)
Basophils Absolute: 0 10*3/uL (ref 0.0–0.1)
Basophils Relative: 0 %
Eosinophils Absolute: 0 10*3/uL (ref 0.0–0.5)
Eosinophils Relative: 1 %
HCT: 30.5 % — ABNORMAL LOW (ref 36.0–46.0)
Hemoglobin: 7.6 g/dL — ABNORMAL LOW (ref 12.0–15.0)
Immature Granulocytes: 1 %
Lymphocytes Relative: 26 %
Lymphs Abs: 1.2 10*3/uL (ref 0.7–4.0)
MCH: 17.6 pg — ABNORMAL LOW (ref 26.0–34.0)
MCHC: 24.9 g/dL — ABNORMAL LOW (ref 30.0–36.0)
MCV: 70.6 fL — ABNORMAL LOW (ref 80.0–100.0)
Monocytes Absolute: 0.4 10*3/uL (ref 0.1–1.0)
Monocytes Relative: 10 %
Neutro Abs: 2.7 10*3/uL (ref 1.7–7.7)
Neutrophils Relative %: 62 %
Platelets: 206 10*3/uL (ref 150–400)
RBC: 4.32 MIL/uL (ref 3.87–5.11)
RDW: 25.7 % — ABNORMAL HIGH (ref 11.5–15.5)
WBC: 4.4 10*3/uL (ref 4.0–10.5)
nRBC: 1.6 % — ABNORMAL HIGH (ref 0.0–0.2)

## 2020-03-13 LAB — SARS CORONAVIRUS 2 BY RT PCR (HOSPITAL ORDER, PERFORMED IN ~~LOC~~ HOSPITAL LAB): SARS Coronavirus 2: POSITIVE — AB

## 2020-03-13 LAB — C-REACTIVE PROTEIN: CRP: 6.7 mg/dL — ABNORMAL HIGH (ref <1.0)

## 2020-03-13 LAB — COMPREHENSIVE METABOLIC PANEL
ALT: 16 U/L (ref 0–44)
AST: 37 U/L (ref 15–41)
Albumin: 2.9 g/dL — ABNORMAL LOW (ref 3.5–5.0)
Alkaline Phosphatase: 52 U/L (ref 38–126)
Anion gap: 12 (ref 5–15)
BUN: 5 mg/dL — ABNORMAL LOW (ref 6–20)
CO2: 27 mmol/L (ref 22–32)
Calcium: 8.4 mg/dL — ABNORMAL LOW (ref 8.9–10.3)
Chloride: 94 mmol/L — ABNORMAL LOW (ref 98–111)
Creatinine, Ser: 0.89 mg/dL (ref 0.44–1.00)
GFR calc Af Amer: >60 mL/min (ref >60)
GFR calc non Af Amer: >60 mL/min (ref >60)
Glucose, Bld: 99 mg/dL (ref 70–99)
Potassium: 3.2 mmol/L — ABNORMAL LOW (ref 3.5–5.1)
Sodium: 133 mmol/L — ABNORMAL LOW (ref 135–145)
Total Bilirubin: 0.7 mg/dL (ref 0.3–1.2)
Total Protein: 7 g/dL (ref 6.5–8.1)

## 2020-03-13 LAB — LACTIC ACID, PLASMA: Lactic Acid, Venous: 1.3 mmol/L (ref 0.5–1.9)

## 2020-03-13 LAB — FIBRINOGEN: Fibrinogen: 502 mg/dL — ABNORMAL HIGH (ref 210–475)

## 2020-03-13 LAB — I-STAT BETA HCG BLOOD, ED (MC, WL, AP ONLY): I-stat hCG, quantitative: <5 m[IU]/mL (ref <5)

## 2020-03-13 LAB — TRIGLYCERIDES: Triglycerides: 112 mg/dL (ref <150)

## 2020-03-13 LAB — LACTATE DEHYDROGENASE: LDH: 385 U/L — ABNORMAL HIGH (ref 98–192)

## 2020-03-13 LAB — HIV ANTIBODY (ROUTINE TESTING W REFLEX): HIV Screen 4th Generation wRfx: NONREACTIVE

## 2020-03-13 LAB — FERRITIN: Ferritin: 17 ng/mL (ref 11–307)

## 2020-03-13 LAB — D-DIMER, QUANTITATIVE: D-Dimer, Quant: 0.63 ug/mL-FEU — ABNORMAL HIGH (ref 0.00–0.50)

## 2020-03-13 LAB — PROCALCITONIN: Procalcitonin: <0.1 ng/mL

## 2020-03-13 MED ORDER — ONDANSETRON HCL 4 MG PO TABS: 4.0000 mg | ORAL_TABLET | Freq: Four times a day (QID) | ORAL | Status: DC | PRN

## 2020-03-13 MED ORDER — METHYLPREDNISOLONE SODIUM SUCC 125 MG IJ SOLR
125.0000 mg | Freq: Once | INTRAMUSCULAR | Status: AC
  Administered 2020-03-13: 125 mg via INTRAVENOUS
  Filled 2020-03-13: qty 2

## 2020-03-13 MED ORDER — ACETAMINOPHEN 325 MG PO TABS
650.0000 mg | ORAL_TABLET | Freq: Once | ORAL | Status: AC
  Administered 2020-03-13: 650 mg via ORAL

## 2020-03-13 MED ORDER — FERROUS SULFATE 325 (65 FE) MG PO TABS
325.0000 mg | ORAL_TABLET | Freq: Every day | ORAL | Status: DC
  Administered 2020-03-14 – 2020-03-15 (×2): 325 mg via ORAL
  Filled 2020-03-13 (×2): qty 1

## 2020-03-13 MED ORDER — TACROLIMUS 0.1 % EX OINT: TOPICAL_OINTMENT | Freq: Two times a day (BID) | CUTANEOUS | Status: DC

## 2020-03-13 MED ORDER — NORETHINDRONE-ETH ESTRADIOL 1-35 MG-MCG PO TABS: 1.0000 | ORAL_TABLET | Freq: Every day | ORAL | Status: DC

## 2020-03-13 MED ORDER — ENOXAPARIN SODIUM 40 MG/0.4ML ~~LOC~~ SOLN
40.0000 mg | Freq: Once | SUBCUTANEOUS | Status: AC
  Administered 2020-03-13: 40 mg via SUBCUTANEOUS
  Filled 2020-03-13: qty 0.4

## 2020-03-13 MED ORDER — SODIUM CHLORIDE 0.9 % IV SOLN
100.0000 mg | Freq: Every day | INTRAVENOUS | Status: AC
  Administered 2020-03-14 – 2020-03-17 (×4): 100 mg via INTRAVENOUS
  Filled 2020-03-13: qty 20
  Filled 2020-03-13: qty 100
  Filled 2020-03-13: qty 20
  Filled 2020-03-13: qty 100

## 2020-03-13 MED ORDER — ONDANSETRON HCL 4 MG/2ML IJ SOLN: 4.0000 mg | Freq: Four times a day (QID) | INTRAMUSCULAR | Status: DC | PRN

## 2020-03-13 MED ORDER — SODIUM CHLORIDE 0.9 % IV SOLN
200.0000 mg | Freq: Once | INTRAVENOUS | Status: AC
  Administered 2020-03-13: 200 mg via INTRAVENOUS
  Filled 2020-03-13: qty 40

## 2020-03-13 MED ORDER — MUPIROCIN 2 % EX OINT
TOPICAL_OINTMENT | Freq: Two times a day (BID) | CUTANEOUS | Status: DC
  Filled 2020-03-13 (×4): qty 22

## 2020-03-13 MED ORDER — ACETAMINOPHEN 325 MG PO TABS
ORAL_TABLET | ORAL | Status: AC
  Filled 2020-03-13: qty 2

## 2020-03-13 MED ORDER — ACETAMINOPHEN 325 MG PO TABS
650.0000 mg | ORAL_TABLET | Freq: Four times a day (QID) | ORAL | Status: DC | PRN
  Administered 2020-03-16 (×2): 650 mg via ORAL
  Filled 2020-03-13 (×2): qty 2

## 2020-03-13 MED ORDER — ENOXAPARIN SODIUM 40 MG/0.4ML ~~LOC~~ SOLN
40.0000 mg | SUBCUTANEOUS | Status: DC
  Administered 2020-03-13: 40 mg via SUBCUTANEOUS
  Filled 2020-03-13: qty 0.4

## 2020-03-13 MED ORDER — ENOXAPARIN SODIUM 80 MG/0.8ML ~~LOC~~ SOLN
80.0000 mg | SUBCUTANEOUS | Status: DC
  Administered 2020-03-14 – 2020-03-16 (×3): 80 mg via SUBCUTANEOUS
  Filled 2020-03-13 (×3): qty 0.8

## 2020-03-13 MED ORDER — PREDNISONE 20 MG PO TABS: 40.0000 mg | ORAL_TABLET | Freq: Two times a day (BID) | ORAL | Status: DC

## 2020-03-13 MED ORDER — DEXAMETHASONE 4 MG PO TABS: 6.0000 mg | ORAL_TABLET | ORAL | Status: DC

## 2020-03-13 MED ORDER — POTASSIUM CHLORIDE CRYS ER 20 MEQ PO TBCR
40.0000 meq | EXTENDED_RELEASE_TABLET | Freq: Once | ORAL | Status: AC
  Administered 2020-03-13: 40 meq via ORAL
  Filled 2020-03-13: qty 2

## 2020-03-13 MED ORDER — IPRATROPIUM-ALBUTEROL 20-100 MCG/ACT IN AERS
1.0000 | INHALATION_SPRAY | Freq: Four times a day (QID) | RESPIRATORY_TRACT | Status: DC
  Administered 2020-03-15 – 2020-03-16 (×5): 1 via RESPIRATORY_TRACT
  Filled 2020-03-13 (×4): qty 4

## 2020-03-13 NOTE — ED Triage Notes (Addendum)
  Pt presents with difficulty breathing, cough and diarrhea since 8/2. States is worse on exertion. Denies fever.

## 2020-03-13 NOTE — ED Provider Notes (Signed)
[facility] EMERGENCY DEPARTMENT Provider Note   CSN: [id number] Arrival date & time: 03/13/20  5809     History No chief complaint on file.   [name] is a 34 y.o. female history of obesity, anemia, hypertension, sleep apnea.  No daily medication use.  Patient presents today Covid positive for shortness of breath.  Symptom onset March 03, 2020, initially with fatigue, nonproductive cough and chills.  Starting March 07, 2020 she developed shortness of breath worse with exertion she got a Covid test at [facility] that day which returned positive.  She reports she has remained short of breath for the last 6 days worsened with exertion, improved with rest.  She also reports nonproductive cough, body aches, fever/chills and rhinorrhea.  She went to urgent care today noted to be hypoxic started on 2 L nasal cannula and sent to the ED for evaluation.  She is also febrile at urgent care which improved with Tylenol.  Associated symptom nonbloody diarrhea.  She reports chest tightness only with exertion improves with rest reported as mild nonradiating generalized.  Denies fall/injury, headache, sore throat, hemoptysis, history of blood clot, abdominal pain, vomiting, extremity swelling/color change, recent travel/immobilization, recent surgeries or any additional concerns.  Of not patient is not immunized against COVID-19. HPI     Past Medical History:  Diagnosis Date  . Anemia   . Blood transfusion without reported diagnosis 2005  . Morbid obesity ([place])   . Sleep apnea    hx of    Patient Active Problem List   Diagnosis Date Noted  . Essential hypertension 03/17/2018  . PMS (premenstrual syndrome) 03/17/2018  . Thiamine deficiency 08/02/2017  . 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 Morbid obesity with BMI of 50.0-59.9, adult ([place]) 07/28/2017  . Chronic zinc deficiency 06/25/2015  . B12 deficiency 06/20/2015  . Hyperglycemia 06/19/2015  . Vitamin D  deficiency 06/20/2013  . 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 Routine general medical examination at a health care facility 12/04/2011  . Iron deficiency anemia 12/04/2011  . Severe obesity (BMI >= 40) ([place]) 12/04/2011    Past Surgical History:  Procedure Laterality Date  . BREATH TEK H PYLORI  02/09/2012   Procedure: BREATH TEK H PYLORI;  Surgeon: Pedro Earls, MD;  Location: [facility];  Service: General;  Laterality: N/A;  . GASTRIC BYPASS  08/29/2012  . HERNIA REPAIR  2014     OB History    Gravida  2   Para  1   Term  1   Preterm      AB  1   Living  1     SAB  1   TAB      Ectopic      Multiple      Live Births  1           Family History  Problem Relation Age of Onset  . Hypertension Father   . Hypertension Mother   . Alcohol abuse Neg Hx   . Arthritis Neg Hx   . Cancer Neg Hx   . Early death Neg Hx   . Heart disease Neg Hx   . Hyperlipidemia Neg Hx   . Kidney disease Neg Hx   . Stroke Neg Hx     Social History   Tobacco Use  . Smoking status: Never Smoker  . Smokeless tobacco: Never Used  Substance Use Topics  . Alcohol use: No  . Drug use: No  Home Medications Prior to Admission medications   Medication Sig Start Date End Date Taking? Authorizing Provider  mupirocin ointment (BACTROBAN) 2 % APPLY OINTMENT TOPICALLY TO AFFECTED AREA ONCE TO TWICE DAILY 08/14/19   [provider]  [facility] 1/35 tablet Take 1 tablet by mouth once daily 09/16/19   Anyanwu, Laray Anger A, MD  tacrolimus (PROTOPIC) 0.1 % ointment APPLY  OINTMENT EXTERNALLY TO AFFECTED AREA ONCE DAILY 01/03/20   Lavonna Monarch, MD  ferrous sulfate 325 (65 FE) MG tablet Take 1 tablet (325 mg total) by mouth 3 (three) times daily with meals. Patient not taking: Reported on 02/01/2019 03/17/18 02/01/19  Janith Lima, MD  FLUoxetine (PROZAC) 10 MG tablet Take 1 tablet (10 mg total) by mouth daily. Patient not taking: Reported on 02/01/2019 03/17/18 02/01/19   Janith Lima, MD  phentermine 37.5 MG capsule Take 1 capsule (37.5 mg total) by mouth every morning. Patient not taking: Reported on 02/01/2019 10/20/18 02/01/19  Janith Lima, MD  [facility] 1/35 tablet TAKE 1 TABLET BY MOUTH ONCE DAILY 06/15/18   Anyanwu, Sallyanne Havers, MD  triamterene-hydrochlorothiazide (DYAZIDE) 37.5-25 MG capsule Take 1 each (1 capsule total) by mouth daily. Patient not taking: Reported on 02/01/2019 03/17/18 02/01/19  Janith Lima, MD    Allergies    Patient has no known allergies.  Review of Systems   Review of Systems Ten systems are reviewed and are negative for acute change except as noted in the HPI  Physical Exam Updated Vital Signs BP 101/68 (BP Location: Left Arm)   Pulse 96   Temp 100.2 F (37.9 C)   Resp (!) 33   Ht 5\' 10"  (1.778 m)   Wt (!) 163.3 kg   LMP 02/28/2020   SpO2 97%   BMI 51.65 kg/m   Physical Exam Constitutional:      General: She is not in acute distress.    Appearance: Normal appearance. She is well-developed. She is obese. She is ill-appearing. She is not toxic-appearing or diaphoretic.  HENT:     Head: Normocephalic and atraumatic.     Right Ear: External ear normal.     Left Ear: External ear normal.  Eyes:     General: Vision grossly intact. Gaze aligned appropriately.     Pupils: Pupils are equal, round, and reactive to light.  Neck:     Trachea: Trachea and phonation normal.  Cardiovascular:     Rate and Rhythm: Normal rate.     Pulses: Normal pulses.  Pulmonary:     Effort: Pulmonary effort is normal. No respiratory distress.     Breath sounds: Normal breath sounds.  Abdominal:     General: There is no distension.     Palpations: Abdomen is soft.     Tenderness: There is no abdominal tenderness. There is no guarding or rebound.  Musculoskeletal:        General: Normal range of motion.     Cervical back: Normal range of motion.     Right lower leg: No edema.     Left lower leg: No edema.  Skin:    General:  Skin is warm and dry.  Neurological:     Mental Status: She is alert.     GCS: GCS eye subscore is 4. GCS verbal subscore is 5. GCS motor subscore is 6.     Comments: Speech is clear and goal oriented, follows commands Major Cranial nerves without deficit, no facial droop Moves extremities without ataxia, coordination intact  Psychiatric:  Behavior: Behavior normal.     ED Results / Procedures / Treatments   Labs (all labs ordered are listed, but only abnormal results are displayed) Labs Reviewed  COMPREHENSIVE METABOLIC PANEL - Abnormal; Notable for the following components:      Result Value   Sodium 133 (*)    Potassium 3.2 (*)    Chloride 94 (*)    BUN 5 (*)    Calcium 8.4 (*)    Albumin 2.9 (*)    All other components within normal limits  CBC WITH DIFFERENTIAL/PLATELET - Abnormal; Notable for the following components:   Hemoglobin 7.6 (*)    HCT 30.5 (*)    MCV 70.6 (*)    MCH 17.6 (*)    MCHC 24.9 (*)    RDW 25.7 (*)    nRBC 1.6 (*)    All other components within normal limits  CULTURE, BLOOD (ROUTINE X 2)  CULTURE, BLOOD (ROUTINE X 2)  SARS CORONAVIRUS 2 BY RT PCR (HOSPITAL ORDER, [place] [facility])  LACTIC ACID, PLASMA  LACTIC ACID, PLASMA  D-DIMER, QUANTITATIVE (NOT AT [facility])  PROCALCITONIN  LACTATE DEHYDROGENASE  FERRITIN  TRIGLYCERIDES  FIBRINOGEN  C-REACTIVE PROTEIN  I-STAT BETA HCG BLOOD, ED ([facility], [facility], [facility] ONLY)    EKG None  Radiology DG Chest 1 View  Result Date: 03/13/2020 CLINICAL DATA:  COVID-19 positivity with difficulty breathing EXAM: CHEST  1 VIEW COMPARISON:  05/15/2012 FINDINGS: Cardiac shadow is enlarged but stable. Diffuse bilateral airspace opacities are noted consistent with the given clinical history of COVID-19 positivity. No bony abnormality is seen. IMPRESSION: Bilateral airspace opacities consistent with the given clinical history. Electronically Signed   By: Inez Catalina M.D.   On: 03/13/2020 11:11     Procedures .Critical Care Performed by: Deliah Boston, PA-C Authorized by: Deliah Boston, PA-C   Critical care provider statement:    Critical care time (minutes):  32   Critical care was necessary to treat or prevent imminent or life-threatening deterioration of the following conditions:  Respiratory failure   Critical care was time spent personally by me on the following activities:  Discussions with consultants, evaluation of patient's response to treatment, examination of patient, ordering and performing treatments and interventions, ordering and review of laboratory studies, ordering and review of radiographic studies, pulse oximetry, re-evaluation of patient's condition, obtaining history from patient or surrogate, review of old charts and development of treatment plan with patient or surrogate   (including critical care time)  Medications Ordered in ED Medications  methylPREDNISolone sodium succinate (SOLU-MEDROL) 125 mg/2 mL injection 125 mg (has no administration in time range)  remdesivir 200 mg in sodium chloride 0.9% 250 mL IVPB (has no administration in time range)    Followed by  remdesivir 100 mg in sodium chloride 0.9 % 100 mL IVPB (has no administration in time range)    ED Course  I have reviewed the triage vital signs and the nursing notes.  Pertinent labs & imaging results that were available during my care of the patient were reviewed by me and considered in my medical decision making (see chart for details).  Clinical Course as of Mar 13 1633  Wed Mar 13, 2020  1622 Dr. Roosevelt Locks   [BM]    Clinical Course User Index [BM] Gari Crown   MDM Rules/Calculators/A&P  Additional history obtained from: 1. Nursing notes from this visit. 2. EMR/UC notes. -------------------------------------------- I reviewed and interpreted labs ordered in triage which include: Negative pregnancy test CMP which shows no emergent  electrolyte derangement, AKI, LFT elevations or gap. CBC shows anemia 7.6 improved from prior, patient denies melena, hematochezia, hematemesis or other GI bleed symptoms, this appears to be patient's baseline.  Patient denies known reason for why hemoglobin is always low.  No leukocytosis.  I individually reviewed patient's chest x-ray and agree with radiologist interpretation below. IMPRESSION:  Bilateral airspace opacities consistent with the given clinical  history.  - Patient's history, presentation and work-up today is consistent with COVID-19 viral pneumonia.  She has no evidence of fluid overload/CHF.  Additionally doubt ACS, PE or other emergent cardiopulmonary etiologies at this time.  No active chest pain, no pleurisy, no hemoptysis or evidence of DVT and shortness of breath gradual in onset.  Will begin patient on treatment for COVID-19 in the ED including remdesivir and Solu-Medrol.  Will consult hospitalist for admission.  Discussed plan of care with Dr. Roslynn Amble who is in agreement.  I have ordered the remainder of Covid admission labs.  Patient remained stable on 2 L nasal cannula.  During our conversation earlier I removed patient's nasal cannula and she desatted to 84%, I immediately return the patient to supplemental oxygen.  She reports shortness of breath improved with supplemental O2.  No active chest pain. - 4:22 PM: Patient case discussed with Dr. Roosevelt Locks, patient accepted to hospitalist service.  Patient reassessment, she is resting comfortably no acute distress vital signs stable on supplemental 02 se is agreeable for admission.  Corinda Ammon was evaluated in Emergency Department on 03/13/2020 for the symptoms described in the history of present illness. She was evaluated in the context of the global COVID-19 pandemic, which necessitated consideration that the patient might be at risk for infection with the SARS-CoV-2 virus that causes COVID-19. Institutional protocols and  algorithms that pertain to the evaluation of patients at risk for COVID-19 are in a state of rapid change based on information released by regulatory bodies including the CDC and federal and state organizations. These policies and algorithms were followed during the patient's care in the ED.  Note: Portions of this report may have been transcribed using voice recognition software. Every effort was made to ensure accuracy; however, inadvertent computerized transcription errors may still be present. Final Clinical Impression(s) / ED Diagnoses Final diagnoses:  COVID-19 virus infection  Acute respiratory failure with hypoxia [facility])    Rx / DC Orders ED Discharge Orders    None       Gari Crown 03/13/20 1635    Lucrezia Starch, MD 03/14/20 1616

## 2020-03-13 NOTE — ED Notes (Signed)
Patient is being discharged from the Urgent Care and sent to the Emergency Department via EMS . Per Provider Jaynee Eagles, patient is in need of higher level of care due to shortness of breath & tachycardia. Patient is aware and verbalizes understanding of plan of care.    Vitals:   03/13/20 0914  BP: 116/69  Pulse: (!) 116  Temp: (!) 102.3 F (39.1 C)  SpO2: 95%

## 2020-03-13 NOTE — ED Triage Notes (Addendum)
Patient arrived by [facility] from [facility] for increased SOB and diagnosed with Covid 9/2. Patient with low sats 88 at [facility] and placed on oxygen. Patient complains of body aches with SOB. Alert and oriented. Unvaccinated and received tylenol prior to transport

## 2020-03-13 NOTE — H&P (Signed)
History and Physical    Stephan Draughn UKG:[id number] DOB: Apr 06, 1986 DOA: 03/13/2020  PCP: Janith Lima, MD (Confirm with patient/family/NH records and if not entered, this has to be entered at [facility] point of entry) Patient coming from: Home  I have personally briefly reviewed patient's old medical records in [place]  Chief Complaint: SOB  HPI: [name] is a 34 y.o. female with medical history significant of iron deficiency anemia, menorrhagia, morbid obesity, presented with fever, shortness breath cough.  Patient was not vaccinated for COVID-19, contracted COVID-19 from her son.  Started to have symptoms about 10 days ago, initially with malaise, muscle aches, dry cough went to tested 7 days ago positive for COVID-19.  Gradually symptoms getting worse, last few days been having shortness of breath and worsening of cough symptoms, loss of taste and having had multiple bouts of loose bowel movement.  Unable to eat solid food and painkilling drinks water for last 3 days.  Today spiked fever 103 with episodic chills. ED Course: X-ray shows bilateral multifocal infiltrates compatible with COVID-19 pneumonia, WBC 4.4, hemoglobin 7.6.  Patient was tachypneic and spiked fever 101.7 in ED.  Review of Systems: As per HPI otherwise 14 point review of systems negative.    Past Medical History:  Diagnosis Date  . Anemia   . Blood transfusion without reported diagnosis 2005  . Morbid obesity ([place])   . Sleep apnea    hx of    Past Surgical History:  Procedure Laterality Date  . BREATH TEK H PYLORI  02/09/2012   Procedure: BREATH TEK H PYLORI;  Surgeon: Pedro Earls, MD;  Location: [facility];  Service: General;  Laterality: N/A;  . GASTRIC BYPASS  08/29/2012  . HERNIA REPAIR  2014     reports that she has never smoked. She has never used smokeless tobacco. She reports that she does not drink alcohol and does not use drugs.  No Known Allergies  Family History    Problem Relation Age of Onset  . Hypertension Father   . Hypertension Mother   . Alcohol abuse Neg Hx   . Arthritis Neg Hx   . Cancer Neg Hx   . Early death Neg Hx   . Heart disease Neg Hx   . Hyperlipidemia Neg Hx   . Kidney disease Neg Hx   . Stroke Neg Hx    Letter of the  Prior to Admission medications   Medication Sig Start Date End Date Taking? Authorizing Provider  mupirocin ointment (BACTROBAN) 2 % APPLY OINTMENT TOPICALLY TO AFFECTED AREA ONCE TO TWICE DAILY 08/14/19   [provider]  [facility] 1/35 tablet Take 1 tablet by mouth once daily 09/16/19   Anyanwu, Laray Anger A, MD  tacrolimus (PROTOPIC) 0.1 % ointment APPLY  OINTMENT EXTERNALLY TO AFFECTED AREA ONCE DAILY 01/03/20   Lavonna Monarch, MD  ferrous sulfate 325 (65 FE) MG tablet Take 1 tablet (325 mg total) by mouth 3 (three) times daily with meals. Patient not taking: Reported on 02/01/2019 03/17/18 02/01/19  Janith Lima, MD  FLUoxetine (PROZAC) 10 MG tablet Take 1 tablet (10 mg total) by mouth daily. Patient not taking: Reported on 02/01/2019 03/17/18 02/01/19  Janith Lima, MD  phentermine 37.5 MG capsule Take 1 capsule (37.5 mg total) by mouth every morning. Patient not taking: Reported on 02/01/2019 10/20/18 02/01/19  Janith Lima, MD  [facility] 1/35 tablet TAKE 1 TABLET BY MOUTH ONCE DAILY 06/15/18   Anyanwu, Sallyanne Havers,  MD  triamterene-hydrochlorothiazide (DYAZIDE) 37.5-25 MG capsule Take 1 each (1 capsule total) by mouth daily. Patient not taking: Reported on 02/01/2019 03/17/18 02/01/19  Janith Lima, MD    Physical Exam: Vitals:   03/13/20 1446 03/13/20 1600 03/13/20 1615 03/13/20 1629  BP: 111/75   101/68  Pulse: 91  90 96  Resp: 16  (!) 26 (!) 33  Temp:      TempSrc:      SpO2: 99% (!) 84% 98% 97%  Weight:      Height:        Constitutional: NAD, calm, comfortable Vitals:   03/13/20 1446 03/13/20 1600 03/13/20 1615 03/13/20 1629  BP: 111/75   101/68  Pulse: 91  90 96  Resp: 16  (!) 26 (!) 33   Temp:      TempSrc:      SpO2: 99% (!) 84% 98% 97%  Weight:      Height:       Eyes: PERRL, lids and conjunctivae normal ENMT: Mucous membranes are moist. Posterior pharynx clear of any exudate or lesions.Normal dentition.  Neck: normal, supple, no masses, no thyromegaly Respiratory: clear to auscultation bilaterally, no wheezing, no crackles.  Increased respiratory effort. No accessory muscle use.  Cardiovascular: Regular rate and rhythm, no murmurs / rubs / gallops. No extremity edema. 2+ pedal pulses. No carotid bruits.  Abdomen: no tenderness, no masses palpated. No hepatosplenomegaly. Bowel sounds positive.  Musculoskeletal: no clubbing / cyanosis. No joint deformity upper and lower extremities. Good ROM, no contractures. Normal muscle tone.  Skin: no rashes, lesions, ulcers. No induration Neurologic: CN 2-12 grossly intact. Sensation intact, DTR normal. Strength 5/5 in all 4.  Psychiatric: Normal judgment and insight. Alert and oriented x 3. Normal mood.     Labs on Admission: I have personally reviewed following labs and imaging studies  CBC: Recent Labs  Lab 03/13/20 0957  WBC 4.4  NEUTROABS 2.7  HGB 7.6*  HCT 30.5*  MCV 70.6*  PLT 119   Basic Metabolic Panel: Recent Labs  Lab 03/13/20 0957  NA 133*  K 3.2*  CL 94*  CO2 27  GLUCOSE 99  BUN 5*  CREATININE 0.89  CALCIUM 8.4*   GFR: Estimated Creatinine Clearance: 149.6 mL/min (by C-G formula based on SCr of 0.89 mg/dL). Liver Function Tests: Recent Labs  Lab 03/13/20 0957  AST 37  ALT 16  ALKPHOS 52  BILITOT 0.7  PROT 7.0  ALBUMIN 2.9*   No results for input(s): LIPASE, AMYLASE in the last 168 hours. No results for input(s): AMMONIA in the last 168 hours. Coagulation Profile: No results for input(s): INR, PROTIME in the last 168 hours. Cardiac Enzymes: No results for input(s): CKTOTAL, CKMB, CKMBINDEX, TROPONINI in the last 168 hours. BNP (last 3 results) No results for input(s): PROBNP in the  last 8760 hours. HbA1C: No results for input(s): HGBA1C in the last 72 hours. CBG: No results for input(s): GLUCAP in the last 168 hours. Lipid Profile: No results for input(s): CHOL, HDL, LDLCALC, TRIG, CHOLHDL, LDLDIRECT in the last 72 hours. Thyroid Function Tests: No results for input(s): TSH, T4TOTAL, FREET4, T3FREE, THYROIDAB in the last 72 hours. Anemia Panel: No results for input(s): VITAMINB12, FOLATE, FERRITIN, TIBC, IRON, RETICCTPCT in the last 72 hours. Urine analysis:    Component Value Date/Time   COLORURINE YELLOW 03/17/2018 0941   APPEARANCEUR [place] (A) 03/17/2018 0941   LABSPEC 1.015 03/17/2018 0941   PHURINE 8.5 (A) 03/17/2018 0941   GLUCOSEU NEGATIVE  03/17/2018 0941   HGBUR NEGATIVE 03/17/2018 [place] 03/17/2018 0941   BILIRUBINUR NEGATIVE 06/08/2013 1917   KETONESUR NEGATIVE 03/17/2018 0941   PROTEINUR NEGATIVE 02/21/2014 1336   UROBILINOGEN 1.0 03/17/2018 0941   NITRITE NEGATIVE 03/17/2018 0941   LEUKOCYTESUR LARGE (A) 03/17/2018 0941    Radiological Exams on Admission: DG Chest 1 View  Result Date: 03/13/2020 CLINICAL DATA:  COVID-19 positivity with difficulty breathing EXAM: CHEST  1 VIEW COMPARISON:  05/15/2012 FINDINGS: Cardiac shadow is enlarged but stable. Diffuse bilateral airspace opacities are noted consistent with the given clinical history of COVID-19 positivity. No bony abnormality is seen. IMPRESSION: Bilateral airspace opacities consistent with the given clinical history. Electronically Signed   By: Inez Catalina M.D.   On: 03/13/2020 11:11    EKG: Independently reviewed.  Normal sinus rhythm, low voltage, T flattening on V4 through V6  Assessment/Plan Active Problems:   * No active hospital problems. *  (please populate well all problems here in Problem List. (For example, if patient is on BP meds at home and you resume or decide to hold them, it is a problem that needs to be her. Same for CAD, COPD, HLD and so  on)  COVID-19 pneumonia with impending respiratory failure -Start remdesivir and prednisone -Breathing medication on the other supporting care -Recommend prone position as tolerated  Anemia chronic iron deficiency -Secondary to menorrhagia, start iron supplement -HH stable as compared to before  Menorrhagia -On hormone manipulation  Morbid obesity -Outpatient PCP follow-up and diet modification recommended  DVT prophylaxis: Lovenox Code Status: Full code Family Communication: None at bedside Disposition Plan: Expect 2 to 3 days hospital stay for COVID-19 pneumonia treatment Consults called: None Admission status: Telemetry admission   Lequita Halt MD [facility] Pager [phone]  03/13/2020, 4:31 PM

## 2020-03-13 NOTE — ED Notes (Signed)
At this time only able to collect one set of blood cultures. Pt is a difficult stick and required IV team to use ultrasound to get first set and working IV. Dr. Roosevelt Locks notified and aware. Dr. Roosevelt Locks stated this is ok for the time being and will let us know if a 2nd set is needed at a later time.

## 2020-03-14 LAB — CBC WITH DIFFERENTIAL/PLATELET
Abs Immature Granulocytes: 0.03 10*3/uL (ref 0.00–0.07)
Basophils Absolute: 0 10*3/uL (ref 0.0–0.1)
Basophils Relative: 0 %
Eosinophils Absolute: 0 10*3/uL (ref 0.0–0.5)
Eosinophils Relative: 0 %
HCT: 29.9 % — ABNORMAL LOW (ref 36.0–46.0)
Hemoglobin: 7.2 g/dL — ABNORMAL LOW (ref 12.0–15.0)
Immature Granulocytes: 1 %
Lymphocytes Relative: 25 %
Lymphs Abs: 0.8 10*3/uL (ref 0.7–4.0)
MCH: 16.9 pg — ABNORMAL LOW (ref 26.0–34.0)
MCHC: 24.1 g/dL — ABNORMAL LOW (ref 30.0–36.0)
MCV: 70.2 fL — ABNORMAL LOW (ref 80.0–100.0)
Monocytes Absolute: 0.1 10*3/uL (ref 0.1–1.0)
Monocytes Relative: 4 %
Neutro Abs: 2.3 10*3/uL (ref 1.7–7.7)
Neutrophils Relative %: 70 %
Platelets: 241 10*3/uL (ref 150–400)
RBC: 4.26 MIL/uL (ref 3.87–5.11)
RDW: 25.9 % — ABNORMAL HIGH (ref 11.5–15.5)
WBC: 3.3 10*3/uL — ABNORMAL LOW (ref 4.0–10.5)
nRBC: 1.5 % — ABNORMAL HIGH (ref 0.0–0.2)

## 2020-03-14 LAB — COMPREHENSIVE METABOLIC PANEL
ALT: 17 U/L (ref 0–44)
AST: 37 U/L (ref 15–41)
Albumin: 2.8 g/dL — ABNORMAL LOW (ref 3.5–5.0)
Alkaline Phosphatase: 47 U/L (ref 38–126)
Anion gap: 14 (ref 5–15)
BUN: 7 mg/dL (ref 6–20)
CO2: 24 mmol/L (ref 22–32)
Calcium: 8.7 mg/dL — ABNORMAL LOW (ref 8.9–10.3)
Chloride: 98 mmol/L (ref 98–111)
Creatinine, Ser: 0.8 mg/dL (ref 0.44–1.00)
GFR calc Af Amer: >60 mL/min (ref >60)
GFR calc non Af Amer: >60 mL/min (ref >60)
Glucose, Bld: 109 mg/dL — ABNORMAL HIGH (ref 70–99)
Potassium: 3.6 mmol/L (ref 3.5–5.1)
Sodium: 136 mmol/L (ref 135–145)
Total Bilirubin: 0.8 mg/dL (ref 0.3–1.2)
Total Protein: 7.2 g/dL (ref 6.5–8.1)

## 2020-03-14 LAB — PHOSPHORUS: Phosphorus: 3.3 mg/dL (ref 2.5–4.6)

## 2020-03-14 LAB — D-DIMER, QUANTITATIVE: D-Dimer, Quant: 0.38 ug/mL-FEU (ref 0.00–0.50)

## 2020-03-14 LAB — FERRITIN: Ferritin: 14 ng/mL (ref 11–307)

## 2020-03-14 LAB — MAGNESIUM: Magnesium: 2 mg/dL (ref 1.7–2.4)

## 2020-03-14 LAB — C-REACTIVE PROTEIN: CRP: 7.5 mg/dL — ABNORMAL HIGH (ref <1.0)

## 2020-03-14 MED ORDER — PANTOPRAZOLE SODIUM 40 MG PO TBEC
40.0000 mg | DELAYED_RELEASE_TABLET | Freq: Every day | ORAL | Status: DC
  Administered 2020-03-14 – 2020-03-17 (×4): 40 mg via ORAL
  Filled 2020-03-14 (×4): qty 1

## 2020-03-14 MED ORDER — METHYLPREDNISOLONE SODIUM SUCC 125 MG IJ SOLR
60.0000 mg | Freq: Three times a day (TID) | INTRAMUSCULAR | Status: DC
  Administered 2020-03-14 – 2020-03-15 (×4): 60 mg via INTRAVENOUS
  Filled 2020-03-14 (×4): qty 2

## 2020-03-14 NOTE — Progress Notes (Signed)
PROGRESS NOTE                                                                                                                                                                                                             Patient Demographics:    [name], is a 34 y.o. female, DOB - [birth year], XYV:[id number]  Admit date - 03/13/2020   Admitting Physician Lequita Halt, MD  Outpatient Primary MD for the patient is Janith Lima, MD  LOS - 1   No chief complaint on file.      Brief Narrative    HPI: [name] is a 34 y.o. female with medical history significant of iron deficiency anemia, menorrhagia, morbid obesity, presented with fever, shortness breath cough.  Patient was not vaccinated for COVID-19, contracted COVID-19 from her son.  Started to have symptoms about 10 days ago, initially with malaise, muscle aches, dry cough went to tested 7 days ago positive for COVID-19.  Gradually symptoms getting worse, last few days been having shortness of breath and worsening of cough symptoms, loss of taste and having had multiple bouts of loose bowel movement.  Unable to eat solid food and painkilling drinks water for last 3 days.  Today spiked fever 103 with episodic chills. ED Course: X-ray shows bilateral multifocal infiltrates compatible with COVID-19 pneumonia, WBC 4.4, hemoglobin 7.6.  Patient was tachypneic and spiked fever 101.7 in ED.   Subjective:    [name] today she does report dyspnea, cough, generalized weakness and fatigue, she denies any chest pain .   Assessment  & Plan :    Active Problems:   COVID-19 virus infection   COVID-19   Acute hypoxic respiratory failure due to COVID-19 pneumonia -Patient presents with hypoxia requiring 3 L nasal cannula, she desaturated to 86% on room air upon my exam, I had to increase up to 3 L nasal cannula. -We will switch her prednisone to IV Solu-Medrol. -Continue with IV  remdesivir -She is on 3 L, no indication for baricitinib/Actemra, but I have discussed with her, she denies any history of TB, viral hepatitis, diverticulitis or perforation, explained risks and benefits, she is agreeable for baricitinib or Actemra if it is indicated.. -I have encouraged her to use incentive spirometry, flutter valve, out of bed to chair, and to prone. -Continue to trend  inflammatory markers closely -Procalcitonin is negative, no indications for antibiotics  Anemia chronic iron deficiency -Secondary to menorrhagia, start iron supplement -Her hemoglobin appears to be at baseline, -As well patient reports history of gastric bypass many years ago, reports she is not taking any of her multivitamins for few years, so we will check anemia panel including O53 and folic acid level.  Menorrhagia -On hormone therapy  Morbid obesity -Outpatient PCP follow-up and diet modification recommended    COVID-19 Labs  Recent Labs    03/13/20 1739 03/14/20 0414  DDIMER 0.63* 0.38  FERRITIN 17 14  LDH 385*  --   CRP 6.7* 7.5*    Lab Results  Component Value Date   SARSCOV2NAA POSITIVE (A) 03/13/2020     Code Status : Full code  Family Communication  : Left her mother voicemail  Disposition Plan  :  Status is: Inpatient  Remains inpatient appropriate because:IV treatments appropriate due to intensity of illness or inability to take PO   Dispo: The patient is from: Home              Anticipated d/c is to: Home              Anticipated d/c date is: > 3 days              Patient currently is not medically stable to d/c.       Consults  :  none  Procedures  : none  DVT Prophylaxis  :  [place] lovenox  Lab Results  Component Value Date   PLT 241 03/14/2020    Antibiotics  :    Anti-infectives (From admission, onward)   Start     Dose/Rate Route Frequency Ordered Stop   03/14/20 1000  remdesivir 100 mg in sodium chloride 0.9 % 100 mL IVPB       "Followed  by" Linked Group Details   100 mg 200 mL/hr over 30 Minutes Intravenous Daily 03/13/20 1620 03/18/20 0959   03/13/20 1630  remdesivir 200 mg in sodium chloride 0.9% 250 mL IVPB       "Followed by" Linked Group Details   200 mg 580 mL/hr over 30 Minutes Intravenous Once 03/13/20 1620 03/13/20 1901        Objective:   Vitals:   03/14/20 0800 03/14/20 0830 03/14/20 0840 03/14/20 0900  BP: 116/85 129/75    Pulse: 86 88 89 87  Resp: 18 (!) 22 (!) 22 (!) 26  Temp:      TempSrc:      SpO2: 90% 91% (!) 89% 92%  Weight:      Height:        Wt Readings from Last 3 Encounters:  03/14/20 (!) 161 kg  02/01/19 (!) 151.5 kg  03/17/18 (!) 154.2 kg     Intake/Output Summary (Last 24 hours) at 03/14/2020 1553 Last data filed at 03/13/2020 1901 Gross per 24 hour  Intake 250 ml  Output --  Net 250 ml     Physical Exam  Awake Alert, Oriented X 3, No new F.N deficits, Normal affect Symmetrical Chest wall movement, Good air movement bilaterally, CTAB RRR,No Gallops,Rubs or new Murmurs, No Parasternal Heave +ve B.Sounds, Abd Soft, No tenderness, No rebound - guarding or rigidity. No Cyanosis, Clubbing or edema, No new Rash or bruise       Data Review:    CBC Recent Labs  Lab 03/13/20 0957 03/14/20 0414  WBC 4.4 3.3*  HGB 7.6* 7.2*  HCT 30.5*  29.9*  PLT 206 241  MCV 70.6* 70.2*  MCH 17.6* 16.9*  MCHC 24.9* 24.1*  RDW 25.7* 25.9*  LYMPHSABS 1.2 0.8  MONOABS 0.4 0.1  EOSABS 0.0 0.0  BASOSABS 0.0 0.0    Chemistries  Recent Labs  Lab 03/13/20 0957 03/14/20 0414  NA 133* 136  K 3.2* 3.6  CL 94* 98  CO2 27 24  GLUCOSE 99 109*  BUN 5* 7  CREATININE 0.89 0.80  CALCIUM 8.4* 8.7*  MG  --  2.0  AST 37 37  ALT 16 17  ALKPHOS 52 47  BILITOT 0.7 0.8   ------------------------------------------------------------------------------------------------------------------ Recent Labs    03/13/20 1740  TRIG 112    Lab Results  Component Value Date   HGBA1C 5.1  03/17/2018   ------------------------------------------------------------------------------------------------------------------ No results for input(s): TSH, T4TOTAL, T3FREE, THYROIDAB in the last 72 hours.  Invalid input(s): FREET3 ------------------------------------------------------------------------------------------------------------------ Recent Labs    03/13/20 1739 03/14/20 0414  FERRITIN 17 14    Coagulation profile No results for input(s): INR, PROTIME in the last 168 hours.  Recent Labs    03/13/20 1739 03/14/20 0414  DDIMER 0.63* 0.38    Cardiac Enzymes No results for input(s): CKMB, TROPONINI, MYOGLOBIN in the last 168 hours.  Invalid input(s): CK ------------------------------------------------------------------------------------------------------------------ No results found for: BNP  Inpatient Medications  Scheduled Meds: . enoxaparin (LOVENOX) injection  80 mg Subcutaneous Q24H  . ferrous sulfate  325 mg Oral Q breakfast  . Ipratropium-Albuterol  1 puff Inhalation Q6H  . methylPREDNISolone (SOLU-MEDROL) injection  60 mg Intravenous Q8H  . mupirocin ointment   Topical BID  . pantoprazole  40 mg Oral Daily   Continuous Infusions: . remdesivir 100 mg in NS 100 mL 100 mg (03/14/20 0856)   PRN Meds:.acetaminophen, ondansetron **OR** ondansetron (ZOFRAN) IV  Micro Results Recent Results (from the past 240 hour(s))  Blood Culture (routine x 2)     Status: None (Preliminary result)   Collection Time: 03/13/20  5:44 PM   Specimen: BLOOD RIGHT FOREARM  Result Value Ref Range Status   Specimen Description BLOOD RIGHT FOREARM  Final   Special Requests   Final    BOTTLES DRAWN AEROBIC AND ANAEROBIC Blood Culture adequate volume   Culture   Final    NO GROWTH < 24 HOURS Performed at [facility], [place] [address] [place], [place] [address]    Report Status PENDING  Incomplete  SARS Coronavirus 2 by RT PCR (hospital order, performed in [facility]  hospital lab) Nasopharyngeal Nasopharyngeal Swab     Status: Abnormal   Collection Time: 03/13/20  7:07 PM   Specimen: Nasopharyngeal Swab  Result Value Ref Range Status   SARS Coronavirus 2 POSITIVE (A) NEGATIVE Final    Comment: RESULT CALLED TO, READ BACK BY AND VERIFIED WITH: K,MUNNETT @2026  03/13/20 EB (NOTE) SARS-CoV-2 target nucleic acids are DETECTED  SARS-CoV-2 RNA is generally detectable in upper respiratory specimens  during the acute phase of infection.  Positive results are indicative  of the presence of the identified virus, but do not rule out bacterial infection or co-infection with other pathogens not detected by the test.  Clinical correlation with patient history and  other diagnostic information is necessary to determine patient infection status.  The expected result is negative.  Fact Sheet for Patients:   StrictlyIdeas.no   Fact Sheet for Healthcare Providers:   BankingDealers.co.za    This test is not yet approved or cleared by the [place] FDA and  has been  authorized for detection and/or diagnosis of SARS-CoV-2 by FDA under an Emergency Use Authorization (EUA).  This EUA will remain in effect (meaning this test can be  used) for the duration of  the COVID-19 declaration under Section 564(b)(1) of the Act, 21 U.S.C. section 360-bbb-3(b)(1), unless the authorization is terminated or revoked sooner.  Performed at [facility], [place] [address], [place], [place] [address]     Radiology Reports DG Chest 1 View  Result Date: 03/13/2020 CLINICAL DATA:  COVID-19 positivity with difficulty breathing EXAM: CHEST  1 VIEW COMPARISON:  05/15/2012 FINDINGS: Cardiac shadow is enlarged but stable. Diffuse bilateral airspace opacities are noted consistent with the given clinical history of COVID-19 positivity. No bony abnormality is seen. IMPRESSION: Bilateral airspace opacities consistent with the given clinical  history. Electronically Signed   By: Inez Catalina M.D.   On: 03/13/2020 11:11     Phillips Climes M.D on 03/14/2020 at 3:53 PM    [facility] -  Office  [phone]

## 2020-03-14 NOTE — TOC Initial Note (Signed)
Transition of Care [facility]) - Initial/Assessment Note    Patient Details  Name: [name] MRN: [id number] Date of Birth: [birth year]  Transition of Care [facility]) CM/SW Contact:    Verdell Carmine, RN Phone Number: 03/14/2020, 11:48 AM  Clinical Narrative:                 Admitted for COVID pneumonia  , history of anemia HBG 7.5 stable from before, menhorragia.  Plan for discharge with self,care, will likely require oxygen at home for short period.. CM will follow for needs.    Expected Discharge Plan: Home/Self Care Barriers to Discharge: Continued Medical Work up   Patient Goals and CMS Choice        Expected Discharge Plan and Services Expected Discharge Plan: Home/Self Care   Discharge Planning Services: CM Consult   Living arrangements for the past 2 months: Apartment                                      Prior Living Arrangements/Services Living arrangements for the past 2 months: Apartment Lives with:: Self Patient language and need for interpreter reviewed:: Yes        Need for Family Participation in Patient Care: Yes (Comment) Care giver support system in place?: Yes (comment)   Criminal Activity/Legal Involvement Pertinent to Current Situation/Hospitalization: No - Comment as needed  Activities of Daily Living      Permission Sought/Granted                  Emotional Assessment Appearance:: Appears stated age     Orientation: : Oriented to Self, Oriented to Place, Oriented to  Time, Oriented to Situation Alcohol / Substance Use: Not Applicable Psych Involvement: No (comment)  Admission diagnosis:  COVID-19 [U07.1] Patient Active Problem List   Diagnosis Date Noted  . COVID-19 virus infection 03/13/2020  . COVID-19 03/13/2020  . Acute respiratory failure with hypoxia ([place])   . Essential hypertension 03/17/2018  . PMS (premenstrual syndrome) 03/17/2018  . Thiamine deficiency 08/02/2017  . 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  Morbid obesity with BMI of 50.0-59.9, adult ([place]) 07/28/2017  . Chronic zinc deficiency 06/25/2015  . B12 deficiency 06/20/2015  . Hyperglycemia 06/19/2015  . Vitamin D deficiency 06/20/2013  . 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 Routine general medical examination at a health care facility 12/04/2011  . Iron deficiency anemia 12/04/2011  . Severe obesity (BMI >= 40) (HCC) 12/04/2011   PCP:  Janith Lima, MD Pharmacy:   [facility], [place]. [place]. [place] [address] Phone: [phone] Fax: [phone]     Social Determinants of Health (SDOH) Interventions    Readmission Risk Interventions No flowsheet data found.

## 2020-03-14 NOTE — ED Notes (Signed)
Lunch delivered. 

## 2020-03-15 DIAGNOSIS — R0602 Shortness of breath: Secondary | ICD-10-CM

## 2020-03-15 DIAGNOSIS — J9601 Acute respiratory failure with hypoxia: Secondary | ICD-10-CM

## 2020-03-15 DIAGNOSIS — U071 COVID-19: Principal | ICD-10-CM

## 2020-03-15 LAB — COMPREHENSIVE METABOLIC PANEL
ALT: 21 U/L (ref 0–44)
AST: 40 U/L (ref 15–41)
Albumin: 2.8 g/dL — ABNORMAL LOW (ref 3.5–5.0)
Alkaline Phosphatase: 43 U/L (ref 38–126)
Anion gap: 9 (ref 5–15)
BUN: 11 mg/dL (ref 6–20)
CO2: 29 mmol/L (ref 22–32)
Calcium: 8.8 mg/dL — ABNORMAL LOW (ref 8.9–10.3)
Chloride: 98 mmol/L (ref 98–111)
Creatinine, Ser: 0.63 mg/dL (ref 0.44–1.00)
GFR calc Af Amer: >60 mL/min (ref >60)
GFR calc non Af Amer: >60 mL/min (ref >60)
Glucose, Bld: 116 mg/dL — ABNORMAL HIGH (ref 70–99)
Potassium: 3.6 mmol/L (ref 3.5–5.1)
Sodium: 136 mmol/L (ref 135–145)
Total Bilirubin: 0.1 mg/dL — ABNORMAL LOW (ref 0.3–1.2)
Total Protein: 6.9 g/dL (ref 6.5–8.1)

## 2020-03-15 LAB — CBC WITH DIFFERENTIAL/PLATELET
Abs Immature Granulocytes: 0.02 10*3/uL (ref 0.00–0.07)
Basophils Absolute: 0 10*3/uL (ref 0.0–0.1)
Basophils Relative: 0 %
Eosinophils Absolute: 0 10*3/uL (ref 0.0–0.5)
Eosinophils Relative: 1 %
HCT: 28.9 % — ABNORMAL LOW (ref 36.0–46.0)
Hemoglobin: 7.2 g/dL — ABNORMAL LOW (ref 12.0–15.0)
Immature Granulocytes: 1 %
Lymphocytes Relative: 31 %
Lymphs Abs: 0.6 10*3/uL — ABNORMAL LOW (ref 0.7–4.0)
MCH: 17.8 pg — ABNORMAL LOW (ref 26.0–34.0)
MCHC: 24.9 g/dL — ABNORMAL LOW (ref 30.0–36.0)
MCV: 71.5 fL — ABNORMAL LOW (ref 80.0–100.0)
Monocytes Absolute: 0.2 10*3/uL (ref 0.1–1.0)
Monocytes Relative: 8 %
Neutro Abs: 1.2 10*3/uL — ABNORMAL LOW (ref 1.7–7.7)
Neutrophils Relative %: 59 %
Platelets: 280 10*3/uL (ref 150–400)
RBC: 4.04 MIL/uL (ref 3.87–5.11)
RDW: 26.2 % — ABNORMAL HIGH (ref 11.5–15.5)
WBC: 2 10*3/uL — ABNORMAL LOW (ref 4.0–10.5)
nRBC: 3 % — ABNORMAL HIGH (ref 0.0–0.2)

## 2020-03-15 LAB — IRON AND TIBC
Iron: 11 ug/dL — ABNORMAL LOW (ref 28–170)
Saturation Ratios: 2 % — ABNORMAL LOW (ref 10.4–31.8)
TIBC: 519 ug/dL — ABNORMAL HIGH (ref 250–450)
UIBC: 508 ug/dL

## 2020-03-15 LAB — PHOSPHORUS: Phosphorus: 4.2 mg/dL (ref 2.5–4.6)

## 2020-03-15 LAB — D-DIMER, QUANTITATIVE: D-Dimer, Quant: 0.38 ug/mL-FEU (ref 0.00–0.50)

## 2020-03-15 LAB — FERRITIN
Ferritin: 11 ng/mL (ref 11–307)
Ferritin: 12 ng/mL (ref 11–307)

## 2020-03-15 LAB — TYPE AND SCREEN
ABO/RH(D): B POS
Antibody Screen: NEGATIVE

## 2020-03-15 LAB — C-REACTIVE PROTEIN: CRP: 1.9 mg/dL — ABNORMAL HIGH (ref <1.0)

## 2020-03-15 LAB — RETICULOCYTES
Immature Retic Fract: 28.2 % — ABNORMAL HIGH (ref 2.3–15.9)
RBC.: 4.11 MIL/uL (ref 3.87–5.11)
Retic Count, Absolute: 57.1 10*3/uL (ref 19.0–186.0)
Retic Ct Pct: 1.4 % (ref 0.4–3.1)

## 2020-03-15 LAB — VITAMIN B12: Vitamin B-12: 187 pg/mL (ref 180–914)

## 2020-03-15 LAB — MAGNESIUM: Magnesium: 2.2 mg/dL (ref 1.7–2.4)

## 2020-03-15 LAB — PROCALCITONIN: Procalcitonin: <0.1 ng/mL

## 2020-03-15 LAB — FOLATE: Folate: 18.8 ng/mL (ref >5.9)

## 2020-03-15 MED ORDER — FERROUS SULFATE 325 (65 FE) MG PO TABS
325.0000 mg | ORAL_TABLET | Freq: Three times a day (TID) | ORAL | Status: DC
  Administered 2020-03-15 – 2020-03-17 (×6): 325 mg via ORAL
  Filled 2020-03-15 (×7): qty 1

## 2020-03-15 MED ORDER — BARICITINIB 2 MG PO TABS
4.0000 mg | ORAL_TABLET | Freq: Every day | ORAL | Status: DC
  Administered 2020-03-15 – 2020-03-17 (×3): 4 mg via ORAL
  Filled 2020-03-15 (×3): qty 2

## 2020-03-15 MED ORDER — DOCUSATE SODIUM 100 MG PO CAPS
200.0000 mg | ORAL_CAPSULE | Freq: Two times a day (BID) | ORAL | Status: DC
  Administered 2020-03-15 – 2020-03-17 (×4): 200 mg via ORAL
  Filled 2020-03-15 (×4): qty 2

## 2020-03-15 MED ORDER — METHYLPREDNISOLONE SODIUM SUCC 125 MG IJ SOLR
60.0000 mg | Freq: Two times a day (BID) | INTRAMUSCULAR | Status: DC
  Administered 2020-03-15 – 2020-03-17 (×4): 60 mg via INTRAVENOUS
  Filled 2020-03-15 (×4): qty 2

## 2020-03-15 NOTE — ED Notes (Signed)
Pt independently up to bed side commode. Increased HR and O2 sats drop to 77% on 3L during activity  Upon returning to bed pt out of breath and unable to speak in full sentences.  After a few minutes of recovery HR returned to 96 and O2 returned to 91%

## 2020-03-15 NOTE — ED Notes (Signed)
Pt able to self prone to take a nap. Respirations are even and non-labored, does not appear in distress. States comfortable breathing on 3L Nasal cannula. Pt denies further needs at this time. Call light within reach

## 2020-03-15 NOTE — Progress Notes (Signed)
Patient arrive to the unit stable, no signs of acute distress. She was advised of our belonging policy. She has cell phone, wallet and eyeglasses in belonging bag.

## 2020-03-15 NOTE — Progress Notes (Signed)
PROGRESS NOTE                                                                                                                                                                                                             Patient Demographics:    [name], is a 34 y.o. female, DOB - June 25, 1986, OAC:[id number]  Outpatient Primary MD for the patient is Janith Lima, MD    LOS - 2  Admit date - 03/13/2020    No chief complaint on file.      Brief Narrative - [name] Popper Gammageis a 34 y.o.femalewith medical history significant ofiron deficiency anemia, menorrhagia, morbid obesity,presented with fever, shortness breath cough. Patient was not vaccinated for COVID-19, contracted COVID-19 from her son. Started to have symptoms about 10 days ago, initially with malaise, muscle aches, dry cough went to tested 7 days ago positive for COVID-19.  She presented to the hospital with shortness of breath was diagnosed with acute hypoxic respiratory failure due to COVID-19 pneumonia and admitted.   Subjective:    [name] today has, No headache, No chest pain, No abdominal pain - No Nausea, No new weakness tingling or numbness, she has cough + shortness of breath, worse on exertion.   Assessment  & Plan :     1. Acute Hypoxic Resp. Failure due to Acute Covid 19 Viral Pneumonitis during the ongoing 2020 Covid 19 Pandemic - she is unfortunately not vaccinated and has incurred moderate parenchymal lung injury, she has been started on IV steroids and Remdesivir, will start Baricitinib right away.  She has already consented for its use.  Encouraged the patient to sit up in chair in the daytime use I-S and flutter valve for pulmonary toiletry and then prone in bed when at night.  Will advance activity and titrate down oxygen as possible.  Actemra/Baricitinib  off label use - patient was told that if COVID-19 pneumonitis gets  worse we might potentially use Actemra off label, patient denies any known history of active diverticulitis, tuberculosis or hepatitis, understands the risks and benefits and wants to proceed with Actemra treatment if required.     SpO2: (!) 85 % O2 Flow Rate (L/min): 3 L/min  Recent Labs  Lab 03/13/20 0957 03/13/20 1739 03/13/20 1907 03/14/20 0414 03/15/20 0501 03/15/20 0719  WBC 4.4  --   --  3.3* 2.0*  --   PLT 206  --   --  241 280  --   CRP  --  6.7*  --  7.5* 1.9*  --   DDIMER  --  0.63*  --  0.38 0.38  --   PROCALCITON  --  <0.10  --   --   --  <0.10  AST 37  --   --  37 40  --   ALT 16  --   --  17 21  --   ALKPHOS 52  --   --  47 43  --   BILITOT 0.7  --   --  0.8 0.1*  --   ALBUMIN 2.9*  --   --  2.8* 2.8*  --   LATICACIDVEN  --  1.3  --   --   --   --   SARSCOV2NAA  --   --  POSITIVE*  --   --   --      2.  Morbid obesity.  BMI 52.  Follow with PCP.  3.  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  Placed on oral iron supplementation, type cross, transfuse if hemoglobin drops below 7.  Continue hormonal treatment for menorrhagia with caution.  Monitor D-dimer closely.     Condition -   Guarded  Family Communication  : Patient wishes to inform the family herself.  Code Status : Full  Consults  : None  Procedures  : None  PUD Prophylaxis : PPI  Disposition Plan  :    Status is: Inpatient  Remains inpatient appropriate because:IV treatments appropriate due to intensity of illness or inability to take PO   Dispo: The patient is from: Home              Anticipated d/c is to: Home              Anticipated d/c date is: > 3 days              Patient currently is not medically stable to d/c.   DVT Prophylaxis  :  Lovenox   Lab Results  Component Value Date   PLT 280 03/15/2020    Diet :  Diet Order            Diet regular Room service appropriate? Yes; Fluid consistency: Thin  Diet effective now                  Inpatient  Medications  Scheduled Meds: . baricitinib  4 mg Oral Daily  . docusate sodium  200 mg Oral BID  . enoxaparin (LOVENOX) injection  80 mg Subcutaneous Q24H  . ferrous sulfate  325 mg Oral TID WC  . Ipratropium-Albuterol  1 puff Inhalation Q6H  . methylPREDNISolone (SOLU-MEDROL) injection  60 mg Intravenous Q12H  . mupirocin ointment   Topical BID  . pantoprazole  40 mg Oral Daily   Continuous Infusions: . remdesivir 100 mg in NS 100 mL Stopped (03/14/20 1556)   PRN Meds:.acetaminophen, [DISCONTINUED] ondansetron **OR** ondansetron (ZOFRAN) IV  Antibiotics  :    Anti-infectives (From admission, onward)   Start     Dose/Rate Route Frequency Ordered Stop   03/14/20 1000  remdesivir 100 mg in sodium chloride 0.9 % 100 mL IVPB       "Followed by" Linked Group Details   100 mg 200 mL/hr over 30 Minutes Intravenous Daily 03/13/20 1620 03/18/20 0959   03/13/20 1630  remdesivir 200 mg  in sodium chloride 0.9% 250 mL IVPB       "Followed by" Linked Group Details   200 mg 580 mL/hr over 30 Minutes Intravenous Once 03/13/20 1620 03/13/20 1901       Time Spent in minutes  30   Lala Lund M.D on 03/15/2020 at 10:28 AM  To page go to [email or web address] - password [facility]  [facility] -  Office  [phone]     See all Orders from today for further details    Objective:   Vitals:   03/15/20 0230 03/15/20 0400 03/15/20 0605 03/15/20 0800  BP: (!) 146/86 125/68 116/72 130/76  Pulse: 81 71 83 96  Resp: (!) 21 (!) 22 19 (!) 22  Temp:      TempSrc:      SpO2: 90% 92% 91% (!) 85%  Weight:      Height:        Wt Readings from Last 3 Encounters:  03/14/20 (!) 161 kg  02/01/19 (!) 151.5 kg  03/17/18 (!) 154.2 kg    No intake or output data in the 24 hours ending 03/15/20 1028   Physical Exam  Awake Alert, No new F.N deficits, Normal affect [place].AT,PERRAL Supple Neck,No JVD, No cervical lymphadenopathy appriciated.  Symmetrical Chest wall movement, Good air movement  bilaterally, CTAB RRR,No Gallops,Rubs or new Murmurs, No Parasternal Heave +ve B.Sounds, Abd Soft, No tenderness, No organomegaly appriciated, No rebound - guarding or rigidity. No Cyanosis, Clubbing or edema, No new Rash or bruise       Data Review:    CBC Recent Labs  Lab 03/13/20 0957 03/14/20 0414 03/15/20 0501  WBC 4.4 3.3* 2.0*  HGB 7.6* 7.2* 7.2*  HCT 30.5* 29.9* 28.9*  PLT 206 241 280  MCV 70.6* 70.2* 71.5*  MCH 17.6* 16.9* 17.8*  MCHC 24.9* 24.1* 24.9*  RDW 25.7* 25.9* 26.2*  LYMPHSABS 1.2 0.8 0.6*  MONOABS 0.4 0.1 0.2  EOSABS 0.0 0.0 0.0  BASOSABS 0.0 0.0 0.0    Recent Labs  Lab 03/13/20 0957 03/13/20 1739 03/14/20 0414 03/15/20 0501 03/15/20 0719  NA 133*  --  136 136  --   K 3.2*  --  3.6 3.6  --   CL 94*  --  98 98  --   CO2 27  --  24 29  --   GLUCOSE 99  --  109* 116*  --   BUN 5*  --  7 11  --   CREATININE 0.89  --  0.80 0.63  --   CALCIUM 8.4*  --  8.7* 8.8*  --   AST 37  --  37 40  --   ALT 16  --  17 21  --   ALKPHOS 52  --  47 43  --   BILITOT 0.7  --  0.8 0.1*  --   ALBUMIN 2.9*  --  2.8* 2.8*  --   MG  --   --  2.0 2.2  --   CRP  --  6.7* 7.5* 1.9*  --   DDIMER  --  0.63* 0.38 0.38  --   PROCALCITON  --  <0.10  --   --  <0.10  LATICACIDVEN  --  1.3  --   --   --     ------------------------------------------------------------------------------------------------------------------ Recent Labs    03/13/20 1740  TRIG 112    Lab Results  Component Value Date   HGBA1C 5.1 03/17/2018   ------------------------------------------------------------------------------------------------------------------ No results for input(s): TSH, T4TOTAL,  T3FREE, THYROIDAB in the last 72 hours.  Invalid input(s): FREET3  Cardiac Enzymes No results for input(s): CKMB, TROPONINI, MYOGLOBIN in the last 168 hours.  Invalid input(s):  CK ------------------------------------------------------------------------------------------------------------------ No results found for: BNP  Micro Results Recent Results (from the past 240 hour(s))  Blood Culture (routine x 2)     Status: None (Preliminary result)   Collection Time: 03/13/20  5:44 PM   Specimen: BLOOD RIGHT FOREARM  Result Value Ref Range Status   Specimen Description BLOOD RIGHT FOREARM  Final   Special Requests   Final    BOTTLES DRAWN AEROBIC AND ANAEROBIC Blood Culture adequate volume   Culture   Final    NO GROWTH 2 DAYS Performed at [facility], [address], [place], [place] [address]    Report Status PENDING  Incomplete  SARS Coronavirus 2 by RT PCR (hospital order, performed in [facility] hospital lab) Nasopharyngeal Nasopharyngeal Swab     Status: Abnormal   Collection Time: 03/13/20  7:07 PM   Specimen: Nasopharyngeal Swab  Result Value Ref Range Status   SARS Coronavirus 2 POSITIVE (A) NEGATIVE Final    Comment: RESULT CALLED TO, READ BACK BY AND VERIFIED WITH: K,MUNNETT @2026  03/13/20 EB (NOTE) SARS-CoV-2 target nucleic acids are DETECTED  SARS-CoV-2 RNA is generally detectable in upper respiratory specimens  during the acute phase of infection.  Positive results are indicative  of the presence of the identified virus, but do not rule out bacterial infection or co-infection with other pathogens not detected by the test.  Clinical correlation with patient history and  other diagnostic information is necessary to determine patient infection status.  The expected result is negative.  Fact Sheet for Patients:   StrictlyIdeas.no   Fact Sheet for Healthcare Providers:   BankingDealers.co.za    This test is not yet approved or cleared by the [place] FDA and  has been authorized for detection and/or diagnosis of SARS-CoV-2 by FDA under an Emergency Use Authorization (EUA).  This EUA  will remain in effect (meaning this test can be  used) for the duration of  the COVID-19 declaration under Section 564(b)(1) of the Act, 21 U.S.C. section 360-bbb-3(b)(1), unless the authorization is terminated or revoked sooner.  Performed at [facility], [place] [address] [place], [place] [address]     Radiology Reports DG Chest 1 View  Result Date: 03/13/2020 CLINICAL DATA:  COVID-19 positivity with difficulty breathing EXAM: CHEST  1 VIEW COMPARISON:  05/15/2012 FINDINGS: Cardiac shadow is enlarged but stable. Diffuse bilateral airspace opacities are noted consistent with the given clinical history of COVID-19 positivity. No bony abnormality is seen. IMPRESSION: Bilateral airspace opacities consistent with the given clinical history. Electronically Signed   By: Inez Catalina M.D.   On: 03/13/2020 11:11

## 2020-03-15 NOTE — ED Notes (Signed)
Pt resting comfortably in bed, denies new or increased shortness of breath and pain at this time Does not appear in distress, respirations are even and non-labored remains on 3L nasal canula.  Skin is warm, dry and intact.  Call light is within reach and pt updated on plan of care

## 2020-03-15 NOTE — Plan of Care (Signed)
Patient educated on pulmonary hygiene

## 2020-03-16 LAB — COMPREHENSIVE METABOLIC PANEL
ALT: 22 U/L (ref 0–44)
AST: 40 U/L (ref 15–41)
Albumin: 2.9 g/dL — ABNORMAL LOW (ref 3.5–5.0)
Alkaline Phosphatase: 49 U/L (ref 38–126)
Anion gap: 11 (ref 5–15)
BUN: 15 mg/dL (ref 6–20)
CO2: 29 mmol/L (ref 22–32)
Calcium: 8.8 mg/dL — ABNORMAL LOW (ref 8.9–10.3)
Chloride: 97 mmol/L — ABNORMAL LOW (ref 98–111)
Creatinine, Ser: 0.79 mg/dL (ref 0.44–1.00)
GFR calc Af Amer: >60 mL/min (ref >60)
GFR calc non Af Amer: >60 mL/min (ref >60)
Glucose, Bld: 128 mg/dL — ABNORMAL HIGH (ref 70–99)
Potassium: 3.4 mmol/L — ABNORMAL LOW (ref 3.5–5.1)
Sodium: 137 mmol/L (ref 135–145)
Total Bilirubin: 0.5 mg/dL (ref 0.3–1.2)
Total Protein: 6.8 g/dL (ref 6.5–8.1)

## 2020-03-16 LAB — CBC WITH DIFFERENTIAL/PLATELET
Abs Immature Granulocytes: 0.01 10*3/uL (ref 0.00–0.07)
Basophils Absolute: 0 10*3/uL (ref 0.0–0.1)
Basophils Relative: 0 %
Eosinophils Absolute: 0 10*3/uL (ref 0.0–0.5)
Eosinophils Relative: 0 %
HCT: 29.8 % — ABNORMAL LOW (ref 36.0–46.0)
Hemoglobin: 7.4 g/dL — ABNORMAL LOW (ref 12.0–15.0)
Immature Granulocytes: 0 %
Lymphocytes Relative: 29 %
Lymphs Abs: 0.8 10*3/uL (ref 0.7–4.0)
MCH: 17.2 pg — ABNORMAL LOW (ref 26.0–34.0)
MCHC: 24.8 g/dL — ABNORMAL LOW (ref 30.0–36.0)
MCV: 69.3 fL — ABNORMAL LOW (ref 80.0–100.0)
Monocytes Absolute: 0.3 10*3/uL (ref 0.1–1.0)
Monocytes Relative: 9 %
Neutro Abs: 1.8 10*3/uL (ref 1.7–7.7)
Neutrophils Relative %: 62 %
Platelets: 343 10*3/uL (ref 150–400)
RBC: 4.3 MIL/uL (ref 3.87–5.11)
RDW: 26.6 % — ABNORMAL HIGH (ref 11.5–15.5)
WBC: 2.9 10*3/uL — ABNORMAL LOW (ref 4.0–10.5)
nRBC: 1.4 % — ABNORMAL HIGH (ref 0.0–0.2)

## 2020-03-16 LAB — PROCALCITONIN: Procalcitonin: <0.1 ng/mL

## 2020-03-16 LAB — MAGNESIUM: Magnesium: 2.1 mg/dL (ref 1.7–2.4)

## 2020-03-16 LAB — D-DIMER, QUANTITATIVE: D-Dimer, Quant: 0.32 ug/mL-FEU (ref 0.00–0.50)

## 2020-03-16 LAB — PATHOLOGIST SMEAR REVIEW

## 2020-03-16 LAB — C-REACTIVE PROTEIN: CRP: 0.7 mg/dL (ref <1.0)

## 2020-03-16 MED ORDER — POTASSIUM CHLORIDE CRYS ER 20 MEQ PO TBCR
40.0000 meq | EXTENDED_RELEASE_TABLET | Freq: Once | ORAL | Status: AC
  Administered 2020-03-16: 40 meq via ORAL
  Filled 2020-03-16: qty 2

## 2020-03-16 MED ORDER — IPRATROPIUM-ALBUTEROL 20-100 MCG/ACT IN AERS
1.0000 | INHALATION_SPRAY | Freq: Four times a day (QID) | RESPIRATORY_TRACT | Status: DC | PRN
  Administered 2020-03-16: 1 via RESPIRATORY_TRACT

## 2020-03-16 NOTE — Progress Notes (Signed)
PROGRESS NOTE                                                                                                                                                                                                             Patient Demographics:    [name], is a 34 y.o. female, DOB - May 12, 1986, WGN:[id number]  Outpatient Primary MD for the patient is Janith Lima, MD    LOS - 3  Admit date - 03/13/2020    No chief complaint on file.      Brief Narrative - [name] Gallardo Gammageis a 34 y.o.femalewith medical history significant ofiron deficiency anemia, menorrhagia, morbid obesity,presented with fever, shortness breath cough. Patient was not vaccinated for COVID-19, contracted COVID-19 from her son. Started to have symptoms about 10 days ago, initially with malaise, muscle aches, dry cough went to tested 7 days ago positive for COVID-19.  She presented to the hospital with shortness of breath was diagnosed with acute hypoxic respiratory failure due to COVID-19 pneumonia and admitted.   Subjective:   Patient in bed, appears comfortable, denies any headache, no fever, no chest pain or pressure, improved shortness of breath , no abdominal pain. No focal weakness.    Assessment  & Plan :     1. Acute Hypoxic Resp. Failure due to Acute Covid 19 Viral Pneumonitis during the ongoing 2020 Covid 19 Pandemic - she is unfortunately not vaccinated and has incurred moderate parenchymal lung injury, she has been appropriately started on combination of IV steroids, Remdesivir and Baricitinib after consent.  She has clinically responded very well and is now gradually improving.  Inflammatory markers have stabilized.  We will continue to monitor closely.  Encouraged the patient to sit up in chair in the daytime use I-S and flutter valve for pulmonary toiletry and then prone in bed when at night.  Will advance activity and titrate down  oxygen as possible.    SpO2: 98 % O2 Flow Rate (L/min): 3 L/min  Recent Labs  Lab 03/13/20 0957 03/13/20 1739 03/13/20 1907 03/14/20 0414 03/15/20 0501 03/15/20 0719 03/16/20 0130  WBC 4.4  --   --  3.3* 2.0*  --  2.9*  PLT 206  --   --  241 280  --  343  CRP  --  6.7*  --  7.5* 1.9*  --  0.7  DDIMER  --  0.63*  --  0.38 0.38  --  0.32  PROCALCITON  --  <0.10  --   --   --  <0.10 <0.10  AST 37  --   --  37 40  --  40  ALT 16  --   --  17 21  --  22  ALKPHOS 52  --   --  47 43  --  49  BILITOT 0.7  --   --  0.8 0.1*  --  0.5  ALBUMIN 2.9*  --   --  2.8* 2.8*  --  2.9*  LATICACIDVEN  --  1.3  --   --   --   --   --   SARSCOV2NAA  --   --  POSITIVE*  --   --   --   --      2.  Morbid obesity.  BMI 52.  Follow with PCP.  3.  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  Placed on oral iron supplementation, type cross, transfuse if hemoglobin drops below 7.  Continue hormonal treatment for menorrhagia with caution.  Monitor D-dimer closely.     Condition -   Guarded  Family Communication  : Patient wishes to inform the family herself.  Code Status : Full  Consults  : None  Procedures  : None  PUD Prophylaxis : PPI  Disposition Plan  :    Status is: Inpatient  Remains inpatient appropriate because:IV treatments appropriate due to intensity of illness or inability to take PO   Dispo: The patient is from: Home              Anticipated d/c is to: Home              Anticipated d/c date is: > 3 days              Patient currently is not medically stable to d/c.   DVT Prophylaxis  :  Lovenox   Lab Results  Component Value Date   PLT 343 03/16/2020    Diet :  Diet Order            Diet regular Room service appropriate? Yes; Fluid consistency: Thin  Diet effective now                  Inpatient Medications  Scheduled Meds: . baricitinib  4 mg Oral Daily  . docusate sodium  200 mg Oral BID  . enoxaparin (LOVENOX) injection  80 mg Subcutaneous  Q24H  . ferrous sulfate  325 mg Oral TID WC  . Ipratropium-Albuterol  1 puff Inhalation Q6H  . methylPREDNISolone (SOLU-MEDROL) injection  60 mg Intravenous Q12H  . mupirocin ointment   Topical BID  . pantoprazole  40 mg Oral Daily   Continuous Infusions: . remdesivir 100 mg in NS 100 mL 100 mg (03/16/20 0840)   PRN Meds:.acetaminophen, [DISCONTINUED] ondansetron **OR** ondansetron (ZOFRAN) IV  Antibiotics  :    Anti-infectives (From admission, onward)   Start     Dose/Rate Route Frequency Ordered Stop   03/14/20 1000  remdesivir 100 mg in sodium chloride 0.9 % 100 mL IVPB       "Followed by" Linked Group Details   100 mg 200 mL/hr over 30 Minutes Intravenous Daily 03/13/20 1620 03/18/20 0959   03/13/20 1630  remdesivir 200 mg in sodium chloride 0.9% 250 mL IVPB       "Followed by" Linked Group Details  200 mg 580 mL/hr over 30 Minutes Intravenous Once 03/13/20 1620 03/13/20 1901       Time Spent in minutes  30   Lala Lund M.D on 03/16/2020 at 11:22 AM  To page go to [email or web address] - password [facility]  [facility] -  Office  [phone]     See all Orders from today for further details    Objective:   Vitals:   03/15/20 1421 03/15/20 1618 03/15/20 2027 03/16/20 0815  BP: 116/62 122/69 129/79 129/71  Pulse: 99 94 100 79  Resp:  18 18 18   Temp:  97.9 F (36.6 C) 98.6 F (37 C) 97.6 F (36.4 C)  TempSrc:  Oral Oral   SpO2: 93% 96% 96% 98%  Weight:      Height:        Wt Readings from Last 3 Encounters:  03/14/20 (!) 161 kg  02/01/19 (!) 151.5 kg  03/17/18 (!) 154.2 kg     Intake/Output Summary (Last 24 hours) at 03/16/2020 1122 Last data filed at 03/16/2020 0503 Gross per 24 hour  Intake --  Output 1 ml  Net -1 ml     Physical Exam  Awake Alert, No new F.N deficits, Normal affect [place].AT,PERRAL Supple Neck,No JVD, No cervical lymphadenopathy appriciated.  Symmetrical Chest wall movement, Good air movement bilaterally, CTAB RRR,No  Gallops, Rubs or new Murmurs, No Parasternal Heave +ve B.Sounds, Abd Soft, No tenderness, No organomegaly appriciated, No rebound - guarding or rigidity. No Cyanosis, Clubbing or edema, No new Rash or bruise    Data Review:    CBC Recent Labs  Lab 03/13/20 0957 03/14/20 0414 03/15/20 0501 03/16/20 0130  WBC 4.4 3.3* 2.0* 2.9*  HGB 7.6* 7.2* 7.2* 7.4*  HCT 30.5* 29.9* 28.9* 29.8*  PLT 206 241 280 343  MCV 70.6* 70.2* 71.5* 69.3*  MCH 17.6* 16.9* 17.8* 17.2*  MCHC 24.9* 24.1* 24.9* 24.8*  RDW 25.7* 25.9* 26.2* 26.6*  LYMPHSABS 1.2 0.8 0.6* 0.8  MONOABS 0.4 0.1 0.2 0.3  EOSABS 0.0 0.0 0.0 0.0  BASOSABS 0.0 0.0 0.0 0.0    Recent Labs  Lab 03/13/20 0957 03/13/20 1739 03/14/20 0414 03/15/20 0501 03/15/20 0719 03/16/20 0130  NA 133*  --  136 136  --  137  K 3.2*  --  3.6 3.6  --  3.4*  CL 94*  --  98 98  --  97*  CO2 27  --  24 29  --  29  GLUCOSE 99  --  109* 116*  --  128*  BUN 5*  --  7 11  --  15  CREATININE 0.89  --  0.80 0.63  --  0.79  CALCIUM 8.4*  --  8.7* 8.8*  --  8.8*  AST 37  --  37 40  --  40  ALT 16  --  17 21  --  22  ALKPHOS 52  --  47 43  --  49  BILITOT 0.7  --  0.8 0.1*  --  0.5  ALBUMIN 2.9*  --  2.8* 2.8*  --  2.9*  MG  --   --  2.0 2.2  --  2.1  CRP  --  6.7* 7.5* 1.9*  --  0.7  DDIMER  --  0.63* 0.38 0.38  --  0.32  PROCALCITON  --  <0.10  --   --  <0.10 <0.10  LATICACIDVEN  --  1.3  --   --   --   --     ------------------------------------------------------------------------------------------------------------------  Recent Labs    03/13/20 1740  TRIG 112    Lab Results  Component Value Date   HGBA1C 5.1 03/17/2018   ------------------------------------------------------------------------------------------------------------------ No results for input(s): TSH, T4TOTAL, T3FREE, THYROIDAB in the last 72 hours.  Invalid input(s): FREET3  Cardiac Enzymes No results for input(s): CKMB, TROPONINI, MYOGLOBIN in the last 168  hours.  Invalid input(s): CK ------------------------------------------------------------------------------------------------------------------ No results found for: BNP  Micro Results Recent Results (from the past 240 hour(s))  Blood Culture (routine x 2)     Status: None (Preliminary result)   Collection Time: 03/13/20  5:44 PM   Specimen: BLOOD RIGHT FOREARM  Result Value Ref Range Status   Specimen Description BLOOD RIGHT FOREARM  Final   Special Requests   Final    BOTTLES DRAWN AEROBIC AND ANAEROBIC Blood Culture adequate volume   Culture   Final    NO GROWTH 2 DAYS Performed at [facility], [address] [place], [place] [address]    Report Status PENDING  Incomplete  SARS Coronavirus 2 by RT PCR (hospital order, performed in [facility] hospital lab) Nasopharyngeal Nasopharyngeal Swab     Status: Abnormal   Collection Time: 03/13/20  7:07 PM   Specimen: Nasopharyngeal Swab  Result Value Ref Range Status   SARS Coronavirus 2 POSITIVE (A) NEGATIVE Final    Comment: RESULT CALLED TO, READ BACK BY AND VERIFIED WITH: K,MUNNETT @2026  03/13/20 EB (NOTE) SARS-CoV-2 target nucleic acids are DETECTED  SARS-CoV-2 RNA is generally detectable in upper respiratory specimens  during the acute phase of infection.  Positive results are indicative  of the presence of the identified virus, but do not rule out bacterial infection or co-infection with other pathogens not detected by the test.  Clinical correlation with patient history and  other diagnostic information is necessary to determine patient infection status.  The expected result is negative.  Fact Sheet for Patients:   StrictlyIdeas.no   Fact Sheet for Healthcare Providers:   BankingDealers.co.za    This test is not yet approved or cleared by the [place] FDA and  has been authorized for detection and/or diagnosis of SARS-CoV-2 by FDA under an Emergency Use  Authorization (EUA).  This EUA will remain in effect (meaning this test can be  used) for the duration of  the COVID-19 declaration under Section 564(b)(1) of the Act, 21 U.S.C. section 360-bbb-3(b)(1), unless the authorization is terminated or revoked sooner.  Performed at [facility], [place] [address], [place], [place] [address]     Radiology Reports DG Chest 1 View  Result Date: 03/13/2020 CLINICAL DATA:  COVID-19 positivity with difficulty breathing EXAM: CHEST  1 VIEW COMPARISON:  05/15/2012 FINDINGS: Cardiac shadow is enlarged but stable. Diffuse bilateral airspace opacities are noted consistent with the given clinical history of COVID-19 positivity. No bony abnormality is seen. IMPRESSION: Bilateral airspace opacities consistent with the given clinical history. Electronically Signed   By: Inez Catalina M.D.   On: 03/13/2020 11:11

## 2020-03-16 NOTE — Progress Notes (Signed)
SATURATION QUALIFICATIONS: (This note is used to comply with regulatory documentation for home oxygen)  Patient Saturations on Room Air at Rest = 93%  Patient Saturations on Room Air while Ambulating = 89-90%  Patient Saturations on N/A Liters of oxygen while Ambulating = N/A%  Please briefly explain why patient needs home oxygen:Patient was a little short of breath

## 2020-03-17 LAB — COMPREHENSIVE METABOLIC PANEL
ALT: 23 U/L (ref 0–44)
AST: 31 U/L (ref 15–41)
Albumin: 3 g/dL — ABNORMAL LOW (ref 3.5–5.0)
Alkaline Phosphatase: 45 U/L (ref 38–126)
Anion gap: 9 (ref 5–15)
BUN: 15 mg/dL (ref 6–20)
CO2: 29 mmol/L (ref 22–32)
Calcium: 8.8 mg/dL — ABNORMAL LOW (ref 8.9–10.3)
Chloride: 100 mmol/L (ref 98–111)
Creatinine, Ser: 0.77 mg/dL (ref 0.44–1.00)
GFR calc Af Amer: >60 mL/min (ref >60)
GFR calc non Af Amer: >60 mL/min (ref >60)
Glucose, Bld: 99 mg/dL (ref 70–99)
Potassium: 3.7 mmol/L (ref 3.5–5.1)
Sodium: 138 mmol/L (ref 135–145)
Total Bilirubin: 0.3 mg/dL (ref 0.3–1.2)
Total Protein: 7 g/dL (ref 6.5–8.1)

## 2020-03-17 LAB — CBC WITH DIFFERENTIAL/PLATELET
Abs Immature Granulocytes: 0.02 10*3/uL (ref 0.00–0.07)
Basophils Absolute: 0 10*3/uL (ref 0.0–0.1)
Basophils Relative: 0 %
Eosinophils Absolute: 0 10*3/uL (ref 0.0–0.5)
Eosinophils Relative: 0 %
HCT: 31.7 % — ABNORMAL LOW (ref 36.0–46.0)
Hemoglobin: 7.7 g/dL — ABNORMAL LOW (ref 12.0–15.0)
Immature Granulocytes: 1 %
Lymphocytes Relative: 30 %
Lymphs Abs: 0.8 10*3/uL (ref 0.7–4.0)
MCH: 17.1 pg — ABNORMAL LOW (ref 26.0–34.0)
MCHC: 24.3 g/dL — ABNORMAL LOW (ref 30.0–36.0)
MCV: 70.6 fL — ABNORMAL LOW (ref 80.0–100.0)
Monocytes Absolute: 0.2 10*3/uL (ref 0.1–1.0)
Monocytes Relative: 8 %
Neutro Abs: 1.6 10*3/uL — ABNORMAL LOW (ref 1.7–7.7)
Neutrophils Relative %: 61 %
Platelets: UNDETERMINED 10*3/uL (ref 150–400)
RBC: 4.49 MIL/uL (ref 3.87–5.11)
RDW: 26.3 % — ABNORMAL HIGH (ref 11.5–15.5)
WBC: 2.5 10*3/uL — ABNORMAL LOW (ref 4.0–10.5)
nRBC: 0.8 % — ABNORMAL HIGH (ref 0.0–0.2)

## 2020-03-17 LAB — C-REACTIVE PROTEIN: CRP: 0.6 mg/dL (ref <1.0)

## 2020-03-17 LAB — MAGNESIUM: Magnesium: 2.3 mg/dL (ref 1.7–2.4)

## 2020-03-17 LAB — PROCALCITONIN: Procalcitonin: <0.1 ng/mL

## 2020-03-17 LAB — D-DIMER, QUANTITATIVE: D-Dimer, Quant: 0.33 ug/mL-FEU (ref 0.00–0.50)

## 2020-03-17 MED ORDER — ALBUTEROL SULFATE HFA 108 (90 BASE) MCG/ACT IN AERS
2.0000 | INHALATION_SPRAY | Freq: Four times a day (QID) | RESPIRATORY_TRACT | 0 refills | Status: DC | PRN
Start: 2020-03-17 — End: 2020-05-08

## 2020-03-17 MED ORDER — DOCUSATE SODIUM 100 MG PO CAPS: 100.0000 mg | ORAL_CAPSULE | Freq: Every day | ORAL | 0 refills | Status: DC

## 2020-03-17 MED ORDER — MENTHOL 3 MG MT LOZG: 1.0000 | LOZENGE | OROMUCOSAL | Status: DC | PRN

## 2020-03-17 MED ORDER — METHYLPREDNISOLONE 4 MG PO TBPK: ORAL_TABLET | ORAL | 0 refills | Status: DC

## 2020-03-17 MED ORDER — FERROUS SULFATE 325 (65 FE) MG PO TABS
325.0000 mg | ORAL_TABLET | Freq: Two times a day (BID) | ORAL | 0 refills | Status: DC
Start: 2020-03-17 — End: 2020-05-08

## 2020-03-17 NOTE — Discharge Instructions (Signed)
Follow with Primary MD Janith Lima, MD in 7 days   Get CBC, CMP, 2 view Chest X ray -  checked next visit within 1 week by Primary MD   Activity: As tolerated with Full fall precautions use walker/cane & assistance as needed  Disposition Home   Diet: Heart Healthy   Special Instructions: If you have smoked or chewed Tobacco  in the last 2 yrs please stop smoking, stop any regular Alcohol  and or any Recreational drug use.  On your next visit with your primary care physician please Get Medicines reviewed and adjusted.  Please request your Prim.MD to go over all Hospital Tests and Procedure/Radiological results at the follow up, please get all Hospital records sent to your Prim MD by signing hospital release before you go home.  If you experience worsening of your admission symptoms, develop shortness of breath, life threatening emergency, suicidal or homicidal thoughts you must seek medical attention immediately by calling 911 or calling your MD immediately  if symptoms less severe.  You Must read complete instructions/literature along with all the possible adverse reactions/side effects for all the Medicines you take and that have been prescribed to you. Take any new Medicines after you have completely understood and accpet all the possible adverse reactions/side effects.           Person Under Monitoring Name: [name]  Location: [address] [place] [address]   Infection Prevention Recommendations for Individuals Confirmed to have, or Being Evaluated for, 2019 Novel Coronavirus (COVID-19) Infection Who Receive Care at Home  Individuals who are confirmed to have, or are being evaluated for, COVID-19 should follow the prevention steps below until a healthcare provider or local or state health department says they can return to normal activities.  Stay home except to get medical care You should restrict activities outside your home, except for  getting medical care. Do not go to work, school, or public areas, and do not use public transportation or taxis.  Call ahead before visiting your doctor Before your medical appointment, call the healthcare provider and tell them that you have, or are being evaluated for, COVID-19 infection. This will help the healthcare provider's office take steps to keep other people from getting infected. Ask your healthcare provider to call the local or state health department.  Monitor your symptoms Seek prompt medical attention if your illness is worsening (e.g., difficulty breathing). Before going to your medical appointment, call the healthcare provider and tell them that you have, or are being evaluated for, COVID-19 infection. Ask your healthcare provider to call the local or state health department.  Wear a facemask You should wear a facemask that covers your nose and mouth when you are in the same room with other people and when you visit a healthcare provider. People who live with or visit you should also wear a facemask while they are in the same room with you.  Separate yourself from other people in your home As much as possible, you should stay in a different room from other people in your home. Also, you should use a separate bathroom, if available.  Avoid sharing household items You should not share dishes, drinking glasses, cups, eating utensils, towels, bedding, or other items with other people in your home. After using these items, you should wash them thoroughly with soap and water.  Cover your coughs and sneezes Cover your mouth and nose with a tissue when you cough or sneeze, or you can  cough or sneeze into your sleeve. Throw used tissues in a lined trash can, and immediately wash your hands with soap and water for at least 20 seconds or use an alcohol-based hand rub.  Wash your [facility] your hands often and thoroughly with soap and water for at least 20 seconds. You can use  an alcohol-based hand sanitizer if soap and water are not available and if your hands are not visibly dirty. Avoid touching your eyes, nose, and mouth with unwashed hands.   Prevention Steps for Caregivers and Household Members of Individuals Confirmed to have, or Being Evaluated for, COVID-19 Infection Being Cared for in the Home  If you live with, or provide care at home for, a person confirmed to have, or being evaluated for, COVID-19 infection please follow these guidelines to prevent infection:  Follow healthcare provider's instructions Make sure that you understand and can help the patient follow any healthcare provider instructions for all care.  Provide for the patient's basic needs You should help the patient with basic needs in the home and provide support for getting groceries, prescriptions, and other personal needs.  Monitor the patient's symptoms If they are getting sicker, call his or her medical provider and tell them that the patient has, or is being evaluated for, COVID-19 infection. This will help the healthcare provider's office take steps to keep other people from getting infected. Ask the healthcare provider to call the local or state health department.  Limit the number of people who have contact with the patient  If possible, have only one caregiver for the patient.  Other household members should stay in another home or place of residence. If this is not possible, they should stay  in another room, or be separated from the patient as much as possible. Use a separate bathroom, if available.  Restrict visitors who do not have an essential need to be in the home.  Keep older adults, very young children, and other sick people away from the patient Keep older adults, very young children, and those who have compromised immune systems or chronic health conditions away from the patient. This includes people with chronic heart, lung, or kidney conditions, diabetes,  and cancer.  Ensure good ventilation Make sure that shared spaces in the home have good air flow, such as from an air conditioner or an opened window, weather permitting.  Wash your hands often  Wash your hands often and thoroughly with soap and water for at least 20 seconds. You can use an alcohol based hand sanitizer if soap and water are not available and if your hands are not visibly dirty.  Avoid touching your eyes, nose, and mouth with unwashed hands.  Use disposable paper towels to dry your hands. If not available, use dedicated cloth towels and replace them when they become wet.  Wear a facemask and gloves  Wear a disposable facemask at all times in the room and gloves when you touch or have contact with the patient's blood, body fluids, and/or secretions or excretions, such as sweat, saliva, sputum, nasal mucus, vomit, urine, or feces.  Ensure the mask fits over your nose and mouth tightly, and do not touch it during use.  Throw out disposable facemasks and gloves after using them. Do not reuse.  Wash your hands immediately after removing your facemask and gloves.  If your personal clothing becomes contaminated, carefully remove clothing and launder. Wash your hands after handling contaminated clothing.  Place all used disposable facemasks, gloves, and  other waste in a lined container before disposing them with other household waste.  Remove gloves and wash your hands immediately after handling these items.  Do not share dishes, glasses, or other household items with the patient  Avoid sharing household items. You should not share dishes, drinking glasses, cups, eating utensils, towels, bedding, or other items with a patient who is confirmed to have, or being evaluated for, COVID-19 infection.  After the person uses these items, you should wash them thoroughly with soap and water.  Wash laundry thoroughly  Immediately remove and wash clothes or bedding that have blood,  body fluids, and/or secretions or excretions, such as sweat, saliva, sputum, nasal mucus, vomit, urine, or feces, on them.  Wear gloves when handling laundry from the patient.  Read and follow directions on labels of laundry or clothing items and detergent. In general, wash and dry with the warmest temperatures recommended on the label.  Clean all areas the individual has used often  Clean all touchable surfaces, such as counters, tabletops, doorknobs, bathroom fixtures, toilets, phones, keyboards, tablets, and bedside tables, every day. Also, clean any surfaces that may have blood, body fluids, and/or secretions or excretions on them.  Wear gloves when cleaning surfaces the patient has come in contact with.  Use a diluted bleach solution (e.g., dilute bleach with 1 part bleach and 10 parts water) or a household disinfectant with a label that says EPA-registered for coronaviruses. To make a bleach solution at home, add 1 tablespoon of bleach to 1 quart (4 cups) of water. For a larger supply, add  cup of bleach to 1 gallon (16 cups) of water.  Read labels of cleaning products and follow recommendations provided on product labels. Labels contain instructions for safe and effective use of the cleaning product including precautions you should take when applying the product, such as wearing gloves or eye protection and making sure you have good ventilation during use of the product.  Remove gloves and wash hands immediately after cleaning.  Monitor yourself for signs and symptoms of illness Caregivers and household members are considered close contacts, should monitor their health, and will be asked to limit movement outside of the home to the extent possible. Follow the monitoring steps for close contacts listed on the symptom monitoring form.   ? If you have additional questions, contact your local health department or call the epidemiologist on call at [phone] (available 24/7). ? This  guidance is subject to change. For the most up-to-date guidance from [facility], please refer to their website: YouBlogs.pl

## 2020-03-17 NOTE — Discharge Summary (Signed)
[name] EBR:[id number] DOB: December 22, 1985 DOA: 03/13/2020  PCP: Janith Lima, MD  Admit date: 03/13/2020  Discharge date: 03/17/2020  Admitted From: Home  Disposition:  Home   Recommendations for Outpatient Follow-up:   Follow up with PCP in 1-2 weeks  PCP Please obtain BMP/CBC, 2 view CXR in 1week,  (see Discharge instructions)   PCP Please follow up on the following pending results: Monitor CBC and iron levels closely.  Outpatient anemia work-up and menorrhagia management as appropriate.   Home Health: None   Equipment/Devices: 2 lits o2 PRN  Consultations: None  Discharge Condition: Stable    CODE STATUS: Full    Diet Recommendation: Heart Healthy   Diet Order            Diet regular Room service appropriate? Yes; Fluid consistency: Thin  Diet effective now                  CC - SOB   Brief history of present illness from the day of admission and additional interim summary    [name] Olsson Gammageis a 34 y.o.femalewith medical history significant ofiron deficiency anemia, menorrhagia, morbid obesity,presented with fever, shortness breath cough. Patient was not vaccinated for COVID-19, contracted COVID-19 from her son. Started to have symptoms about 10 days ago, initially with malaise, muscle aches, dry cough went to tested 7 days ago positive for COVID-19.  She presented to the hospital with shortness of breath was diagnosed with acute hypoxic respiratory failure due to COVID-19 pneumonia and admitted.                                                                 Hospital Course   1. Acute Hypoxic Resp. Failure due to Acute Covid 19 Viral Pneumonitis during the ongoing 2020 Covid 19 Pandemic - she is unfortunately not vaccinated and has incurred moderate parenchymal lung injury, she  has been appropriately started on combination of IV steroids, Remdesivir and Baricitinib after consent.  She has clinically responded very well and is now gradually improving she is currently at rest on 2 L nasal cannula and at times upon walking she at times drops below 88% on room air and promptly comes back up after application of 2 to 3 L nasal cannula oxygen.     At this point she will be discharged home on steroid taper, rescue inhaler and 2 L of nasal cannula oxygen to be used as needed with close PCP follow-up.    Recent Labs  Lab 03/13/20 1739 03/13/20 1907 03/14/20 0414 03/15/20 0146 03/15/20 0501 03/15/20 0719 03/16/20 0130 03/17/20 0907  CRP 6.7*  --  7.5*  --  1.9*  --  0.7  --   DDIMER 0.63*  --  0.38  --  0.38  --  0.32 0.33  FERRITIN 17  --  _0 --   --   --   PROCALCITON <0.10  --   --   --   --  <0.10 <0.10  --   SARSCOV2NAA  --  POSITIVE*  --   --   --   --   --   --     Hepatic Function Latest Ref Rng & Units 03/17/2020 03/16/2020 03/15/2020  Total Protein 6.5 - 8.1 g/dL 7.0 6.8 6.9  Albumin 3.5 - 5.0 g/dL 3.0(L) 2.9(L) 2.8(L)  AST 15 - 41 U/L 31 40 40  ALT 0 - 44 U/L _1 Alk Phosphatase 38 - 126 U/L 45 49 43  Total Bilirubin 0.3 - 1.2 mg/dL 0.3 0.5 0.1(L)     2.  Morbid obesity.  BMI 52.  Follow with PCP.  3.  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  Placed on oral iron supplementation, no transfusions were needed but would request PCP to monitor CBC and iron panel closely.   Discharge diagnosis     Active Problems:   COVID-19 virus infection   COVID-19    Discharge instructions    Discharge Instructions    Discharge instructions   Complete by: As directed    Follow with Primary MD Janith Lima, MD in 7 days   Get CBC, CMP, 2 view Chest X ray -  checked next visit within 1 week by Primary MD   Activity: As tolerated with Full fall precautions use walker/cane & assistance as needed  Disposition Home   Diet: Heart  Healthy   Special Instructions: If you have smoked or chewed Tobacco  in the last 2 yrs please stop smoking, stop any regular Alcohol  and or any Recreational drug use.  On your next visit with your primary care physician please Get Medicines reviewed and adjusted.  Please request your Prim.MD to go over all Hospital Tests and Procedure/Radiological results at the follow up, please get all Hospital records sent to your Prim MD by signing hospital release before you go home.  If you experience worsening of your admission symptoms, develop shortness of breath, life threatening emergency, suicidal or homicidal thoughts you must seek medical attention immediately by calling 911 or calling your MD immediately  if symptoms less severe.  You Must read complete instructions/literature along with all the possible adverse reactions/side effects for all the Medicines you take and that have been prescribed to you. Take any new Medicines after you have completely understood and accpet all the possible adverse reactions/side effects.*   Increase activity slowly   Complete by: As directed    MyChart COVID-19 home monitoring program   Complete by: Mar 17, 2020    Is the patient willing to use the [place] for home monitoring?: Yes   Temperature monitoring   Complete by: Mar 17, 2020    After how many days would you like to receive a notification of this patient's flowsheet entries?: 1      Discharge Medications   Allergies as of 03/17/2020   No Known Allergies     Medication List    TAKE these medications   albuterol 108 (90 Base) MCG/ACT inhaler Commonly known as: VENTOLIN HFA Inhale 2 puffs into the lungs every 6 (six) hours as needed for wheezing or shortness of breath.   docusate sodium 100 MG capsule Commonly known as: COLACE Take 1 capsule (100 mg total) by mouth daily.   ferrous sulfate  325 (65 FE) MG tablet Take 1 tablet (325 mg total) by mouth 2 (two) times daily with a  meal.   guaiFENesin-dextromethorphan 100-10 MG/5ML syrup Commonly known as: ROBITUSSIN DM Take 5 mLs by mouth every 4 (four) hours as needed for cough.   methylPREDNISolone 4 MG Tbpk tablet Commonly known as: MEDROL DOSEPAK follow package directions   mupirocin ointment 2 % Commonly known as: BACTROBAN Apply 1 application topically See admin instructions. Apply to affected area one to two times a day   Pirmella 1/35 tablet Generic drug: norethindrone-ethinyl estradiol 1/35 Take 1 tablet by mouth once daily   tacrolimus 0.1 % ointment Commonly known as: PROTOPIC APPLY  OINTMENT EXTERNALLY TO AFFECTED AREA ONCE DAILY What changed: See the new instructions.            Durable Medical Equipment  (From admission, onward)         Start     Ordered   03/16/20 1553  For home use only DME oxygen  Once       Question Answer Comment  Length of Need 6 Months   Mode or (Route) Nasal cannula   Liters per Minute 3   Frequency Continuous (stationary and portable oxygen unit needed)   Oxygen conserving device Yes   Oxygen delivery system Gas      03/16/20 1552           Follow-up Information    Janith Lima, MD Follow up in 1 week(s).   Specialty: Internal Medicine Contact information: [place] [address] [phone]               Major procedures and Radiology Reports - PLEASE review detailed and final reports thoroughly  -       DG Chest 1 View  Result Date: 03/13/2020 CLINICAL DATA:  COVID-19 positivity with difficulty breathing EXAM: CHEST  1 VIEW COMPARISON:  05/15/2012 FINDINGS: Cardiac shadow is enlarged but stable. Diffuse bilateral airspace opacities are noted consistent with the given clinical history of COVID-19 positivity. No bony abnormality is seen. IMPRESSION: Bilateral airspace opacities consistent with the given clinical history. Electronically Signed   By: Inez Catalina M.D.   On: 03/13/2020 11:11    Micro Results       Recent Results (from the past 240 hour(s))  Blood Culture (routine x 2)     Status: None (Preliminary result)   Collection Time: 03/13/20  5:44 PM   Specimen: BLOOD RIGHT FOREARM  Result Value Ref Range Status   Specimen Description BLOOD RIGHT FOREARM  Final   Special Requests   Final    BOTTLES DRAWN AEROBIC AND ANAEROBIC Blood Culture adequate volume   Culture   Final    NO GROWTH 3 DAYS Performed at [facility], [place] [address] [place], [place] [address]    Report Status PENDING  Incomplete  SARS Coronavirus 2 by RT PCR (hospital order, performed in [facility] hospital lab) Nasopharyngeal Nasopharyngeal Swab     Status: Abnormal   Collection Time: 03/13/20  7:07 PM   Specimen: Nasopharyngeal Swab  Result Value Ref Range Status   SARS Coronavirus 2 POSITIVE (A) NEGATIVE Final    Comment: RESULT CALLED TO, READ BACK BY AND VERIFIED WITH: K,MUNNETT _0  03/13/20 EB (NOTE) SARS-CoV-2 target nucleic acids are DETECTED  SARS-CoV-2 RNA is generally detectable in upper respiratory specimens  during the acute phase of infection.  Positive results are indicative  of the presence of the identified virus,  but do not rule out bacterial infection or co-infection with other pathogens not detected by the test.  Clinical correlation with patient history and  other diagnostic information is necessary to determine patient infection status.  The expected result is negative.  Fact Sheet for Patients:   StrictlyIdeas.no   Fact Sheet for Healthcare Providers:   BankingDealers.co.za    This test is not yet approved or cleared by the [place] FDA and  has been authorized for detection and/or diagnosis of SARS-CoV-2 by FDA under an Emergency Use Authorization (EUA).  This EUA will remain in effect (meaning this test can be  used) for the duration of  the COVID-19 declaration under Section 564(b)(1) of the Act, 21 U.S.C. section  360-bbb-3(b)(1), unless the authorization is terminated or revoked sooner.  Performed at [facility], [place] [address], [place], [place] [address]     Today   Subjective    Jaimarie Rapozo today has no headache,no chest abdominal pain,no new weakness tingling or numbness, feels much better wants to go home today.     Objective   Blood pressure 137/79, pulse 77, temperature 98.7 F (37.1 C), resp. rate 20, height _0  (1.753 m), weight (!) 161 kg, last menstrual period 02/28/2020, SpO2 93 %.  No intake or output data in the 24 hours ending 03/17/20 1015  Exam  Awake Alert, No new F.N deficits, Normal affect [place].AT,PERRAL Supple Neck,No JVD, No cervical lymphadenopathy appriciated.  Symmetrical Chest wall movement, Good air movement bilaterally, CTAB RRR,No Gallops,Rubs or new Murmurs, No Parasternal Heave +ve B.Sounds, Abd Soft, Non tender, No organomegaly appriciated, No rebound -guarding or rigidity. No Cyanosis, Clubbing or edema, No new Rash or bruise   Data Review   CBC w Diff:  Lab Results  Component Value Date   WBC 2.9 (L) 03/16/2020   HGB 7.4 (L) 03/16/2020   HCT 29.8 (L) 03/16/2020   PLT 343 03/16/2020   LYMPHOPCT 29 03/16/2020   MONOPCT 9 03/16/2020   EOSPCT 0 03/16/2020   BASOPCT 0 03/16/2020    CMP:  Lab Results  Component Value Date   NA 138 03/17/2020   K 3.7 03/17/2020   CL 100 03/17/2020   CO2 29 03/17/2020   BUN 15 03/17/2020   CREATININE 0.77 03/17/2020   CREATININE 0.63 02/26/2012   PROT 7.0 03/17/2020   ALBUMIN 3.0 (L) 03/17/2020   BILITOT 0.3 03/17/2020   ALKPHOS 45 03/17/2020   AST 31 03/17/2020   ALT 23 03/17/2020  .   Total Time in preparing paper work, data evaluation and todays exam - 53 minutes  Lala Lund M.D on 03/17/2020 at 10:15 AM  [facility]   Office  [phone]

## 2020-03-17 NOTE — Progress Notes (Addendum)
SATURATION QUALIFICATIONS: (This note is used to comply with regulatory documentation for home oxygen)  Patient Saturations on Room Air at Rest = 93  Patient Saturations on Room Air while Ambulating = 84  Patient Saturations on 3Liters of oxygen while Ambulating = 96%  Please briefly explain why patient needs home oxygen: Pt has had some SOB

## 2020-03-18 ENCOUNTER — Ambulatory Visit: Payer: BC Managed Care – PPO | Admitting: Dermatology

## 2020-03-20 LAB — CULTURE, BLOOD (ROUTINE X 2)
Culture: NO GROWTH
Special Requests: ADEQUATE

## 2020-04-10 ENCOUNTER — Ambulatory Visit: Payer: BC Managed Care – PPO | Admitting: Internal Medicine

## 2020-04-10 ENCOUNTER — Other Ambulatory Visit: Payer: Self-pay

## 2020-04-10 ENCOUNTER — Encounter: Payer: Self-pay | Admitting: Internal Medicine

## 2020-04-10 ENCOUNTER — Ambulatory Visit (INDEPENDENT_AMBULATORY_CARE_PROVIDER_SITE_OTHER): Payer: BC Managed Care – PPO

## 2020-04-10 VITALS — BP 180/108 | HR 84 | Temp 98.7°F | Resp 16 | Ht 69.0 in | Wt 360.0 lb

## 2020-04-10 DIAGNOSIS — D508 Other iron deficiency anemias: Secondary | ICD-10-CM | POA: Diagnosis not present

## 2020-04-10 DIAGNOSIS — E538 Deficiency of other specified B group vitamins: Secondary | ICD-10-CM | POA: Diagnosis not present

## 2020-04-10 DIAGNOSIS — R918 Other nonspecific abnormal finding of lung field: Secondary | ICD-10-CM

## 2020-04-10 DIAGNOSIS — Z0000 Encounter for general adult medical examination without abnormal findings: Secondary | ICD-10-CM

## 2020-04-10 DIAGNOSIS — U071 COVID-19: Secondary | ICD-10-CM | POA: Diagnosis not present

## 2020-04-10 DIAGNOSIS — I10 Essential (primary) hypertension: Secondary | ICD-10-CM | POA: Insufficient documentation

## 2020-04-10 DIAGNOSIS — R0600 Dyspnea, unspecified: Secondary | ICD-10-CM | POA: Diagnosis not present

## 2020-04-10 DIAGNOSIS — Z9884 Bariatric surgery status: Secondary | ICD-10-CM | POA: Diagnosis not present

## 2020-04-10 DIAGNOSIS — G4733 Obstructive sleep apnea (adult) (pediatric): Secondary | ICD-10-CM | POA: Insufficient documentation

## 2020-04-10 DIAGNOSIS — D539 Nutritional anemia, unspecified: Secondary | ICD-10-CM

## 2020-04-10 DIAGNOSIS — J1282 Pneumonia due to coronavirus disease 2019: Secondary | ICD-10-CM

## 2020-04-10 DIAGNOSIS — E60 Dietary zinc deficiency: Secondary | ICD-10-CM | POA: Diagnosis not present

## 2020-04-10 DIAGNOSIS — E519 Thiamine deficiency, unspecified: Secondary | ICD-10-CM | POA: Diagnosis not present

## 2020-04-10 DIAGNOSIS — D520 Dietary folate deficiency anemia: Secondary | ICD-10-CM

## 2020-04-10 DIAGNOSIS — E559 Vitamin D deficiency, unspecified: Secondary | ICD-10-CM

## 2020-04-10 MED ORDER — NEBIVOLOL HCL 10 MG PO TABS: 10.0000 mg | ORAL_TABLET | Freq: Every day | ORAL | 0 refills | Status: DC

## 2020-04-10 MED ORDER — INDAPAMIDE 1.25 MG PO TABS: 1.2500 mg | ORAL_TABLET | Freq: Every day | ORAL | 0 refills | Status: DC

## 2020-04-10 NOTE — Patient Instructions (Signed)

## 2020-04-10 NOTE — Progress Notes (Signed)
Subjective:  Patient ID: [name], female    DOB: 1986-04-06  Age: 34 y.o. MRN: [id number]  CC: Annual Exam, Anemia, and Hypertension  This visit occurred during the SARS-CoV-2 public health emergency.  Safety protocols were in place, including screening questions prior to the visit, additional usage of staff PPE, and extensive cleaning of exam room while observing appropriate contact time as indicated for disinfecting solutions.   HPI Charizma Gardiner presents for a CPX.   She was admitted about 3 weeks ago for the treatment of COVID-19 infection. She tells me she feels better but continues to complain of rare nonproductive cough, fatigue, shortness of breath, and dyspnea on exertion. She has a history of sleep apnea and its not currently being treated. She has a history of vitamin deficiencies but tells me she is not currently taking any vitamin supplements.  Outpatient Medications Prior to Visit  Medication Sig Dispense Refill  . PIRMELLA 1/35 tablet Take 1 tablet by mouth once daily 84 tablet 6  . albuterol (VENTOLIN HFA) 108 (90 Base) MCG/ACT inhaler Inhale 2 puffs into the lungs every 6 (six) hours as needed for wheezing or shortness of breath. (Patient not taking: Reported on 04/10/2020) 6.7 g 0  . docusate sodium (COLACE) 100 MG capsule Take 1 capsule (100 mg total) by mouth daily. (Patient not taking: Reported on 04/10/2020) 30 capsule 0  . ferrous sulfate 325 (65 FE) MG tablet Take 1 tablet (325 mg total) by mouth 2 (two) times daily with a meal. (Patient not taking: Reported on 04/10/2020) 60 tablet 0  . tacrolimus (PROTOPIC) 0.1 % ointment APPLY  OINTMENT EXTERNALLY TO AFFECTED AREA ONCE DAILY (Patient not taking: Reported on 04/10/2020) 90 g 2  . guaiFENesin-dextromethorphan (ROBITUSSIN DM) 100-10 MG/5ML syrup Take 5 mLs by mouth every 4 (four) hours as needed for cough. (Patient not taking: Reported on 04/10/2020)    . methylPREDNISolone (MEDROL DOSEPAK) 4 MG TBPK  tablet follow package directions (Patient not taking: Reported on 04/10/2020) 21 tablet 0  . mupirocin ointment (BACTROBAN) 2 % Apply 1 application topically See admin instructions. Apply to affected area one to two times a day (Patient not taking: Reported on 04/10/2020)     No facility-administered medications prior to visit.    ROS Review of Systems  Constitutional: Positive for fatigue. Negative for appetite change, chills, diaphoresis and unexpected weight change.  HENT: Negative.   Eyes: Negative.   Respiratory: Positive for apnea, cough and shortness of breath. Negative for chest tightness and wheezing.   Cardiovascular: Negative for chest pain, palpitations and leg swelling.  Gastrointestinal: Negative for abdominal pain, blood in stool, constipation, diarrhea, nausea and vomiting.  Endocrine: Negative.   Genitourinary: Negative.  Negative for difficulty urinating, dysuria and vaginal bleeding.  Musculoskeletal: Negative for arthralgias and myalgias.  Skin: Positive for pallor.  Neurological: Negative.  Negative for dizziness, weakness, light-headedness, numbness and headaches.  Hematological: Negative for adenopathy. Does not bruise/bleed easily.  Psychiatric/Behavioral: Negative.     Objective:  BP (!) 180/108   Pulse 84   Temp 98.7 F (37.1 C) (Oral)   Resp 16   Ht 5\' 9"  (1.753 m)   Wt (!) 360 lb (163.3 kg)   LMP 03/20/2020 Comment: OCP  SpO2 96%   BMI 53.16 kg/m   BP Readings from Last 3 Encounters:  04/10/20 (!) 180/108  03/17/20 137/79  03/13/20 116/69    Wt Readings from Last 3 Encounters:  04/10/20 (!) 360 lb (163.3 kg)  03/14/20 Marland Kitchen)  355 lb (161 kg)  02/01/19 (!) 334 lb (151.5 kg)    Physical Exam Vitals reviewed.  Constitutional:      General: She is not in acute distress.    Appearance: She is obese. She is ill-appearing. She is not toxic-appearing or diaphoretic.  HENT:     Nose: Nose normal.     Mouth/Throat:     Mouth: Mucous membranes are  moist. Mucous membranes are pale.     Pharynx: Oropharynx is clear.  Eyes:     General: No scleral icterus.    Conjunctiva/sclera: Conjunctivae normal.  Cardiovascular:     Rate and Rhythm: Normal rate and regular rhythm.     Heart sounds: No murmur heard.   Pulmonary:     Effort: Pulmonary effort is normal.     Breath sounds: No stridor. No wheezing, rhonchi or rales.  Abdominal:     General: Abdomen is protuberant. Bowel sounds are normal. There is no distension.     Palpations: Abdomen is soft. There is no hepatomegaly, splenomegaly or mass.  Musculoskeletal:        General: Normal range of motion.     Cervical back: Neck supple.     Right lower leg: No edema.     Left lower leg: No edema.  Lymphadenopathy:     Cervical: No cervical adenopathy.  Skin:    General: Skin is warm and dry.     Coloration: Skin is pale.  Neurological:     General: No focal deficit present.     Mental Status: She is alert and oriented to person, place, and time. Mental status is at baseline.  Psychiatric:        Mood and Affect: Mood normal.        Behavior: Behavior normal.     Lab Results  Component Value Date   WBC 4.4 04/10/2020   HGB 8.5 Repeated and verified X2. (L) 04/10/2020   HCT 29.8 (L) 04/10/2020   PLT 194.0 04/10/2020   GLUCOSE 99 03/17/2020   CHOL 162 07/28/2017   TRIG 112 03/13/2020   HDL 64.50 07/28/2017   LDLCALC 64 07/28/2017   ALT 23 03/17/2020   AST 31 03/17/2020   NA 138 03/17/2020   K 3.7 03/17/2020   CL 100 03/17/2020   CREATININE 0.77 03/17/2020   BUN 15 03/17/2020   CO2 29 03/17/2020   TSH 1.57 04/10/2020   HGBA1C 5.1 03/17/2018    DG Chest 1 View  Result Date: 03/13/2020 CLINICAL DATA:  COVID-19 positivity with difficulty breathing EXAM: CHEST  1 VIEW COMPARISON:  05/15/2012 FINDINGS: Cardiac shadow is enlarged but stable. Diffuse bilateral airspace opacities are noted consistent with the given clinical history of COVID-19 positivity. No bony abnormality  is seen. IMPRESSION: Bilateral airspace opacities consistent with the given clinical history. Electronically Signed   By: Inez Catalina M.D.   On: 03/13/2020 11:11   DG Chest 2 View  Result Date: 04/11/2020 CLINICAL DATA:  Dyspnea.  History of COVID-19. EXAM: CHEST - 2 VIEW COMPARISON:  March 13, 2020. FINDINGS: Stable cardiomegaly. Bilateral lung opacities noted on prior exam are significantly improved currently, with residual opacity remaining in right lung base. No pneumothorax or pleural effusion is noted. Bony thorax is unremarkable. IMPRESSION: Significantly improved bilateral lung opacities compared to prior exam, with residual opacity remaining in right lung base. Electronically Signed   By: Marijo Conception M.D.   On: 04/11/2020 09:08    Assessment & Plan:   Sally was  seen today for annual exam, anemia and hypertension.  Diagnoses and all orders for this visit:  History of Roux-en-Y gastric bypass -     CBC with Differential/Platelet; Future -     CBC with Differential/Platelet  B12 deficiency- I have asked her to start receiving parenteral B12 replacement therapy. -     CBC with Differential/Platelet; Future -     Folate; Future -     CBC with Differential/Platelet -     Folate  Iron deficiency anemia secondary to inadequate dietary iron intake- Her H&H has improved slightly but she is still profoundly anemic with a low iron level. I recommended that she receive a series of iron infusions. -     CBC with Differential/Platelet; Future -     Iron; Future -     Ferritin; Future -     CBC with Differential/Platelet -     Iron -     Ferritin  Thiamine deficiency- I will monitor her thiamine level. -     CBC with Differential/Platelet; Future -     CBC with Differential/Platelet  Vitamin D deficiency -     VITAMIN D 25 Hydroxy (Vit-D Deficiency, Fractures); Future -     VITAMIN D 25 Hydroxy (Vit-D Deficiency, Fractures) -     Cholecalciferol 1.25 MG (50000 UT) capsule;  Take 1 capsule (50,000 Units total) by mouth once a week.  Chronic zinc deficiency -     Zinc; Future -     Zinc  Deficiency anemia- She has multiple vitamin deficiencies. -     CBC with Differential/Platelet; Future -     Vitamin B12; Future -     Iron; Future -     Folate; Future -     Ferritin; Future -     Zinc; Future -     Vitamin B1; Future -     CBC with Differential/Platelet -     Vitamin B12 -     Iron -     Folate -     Ferritin -     Zinc -     Vitamin B1  Primary hypertension- Her blood pressure is not adequately well controlled. I recommended she treat this with nebivolol and indapamide. -     Urinalysis, Routine w reflex microscopic; Future -     TSH; Future -     nebivolol (BYSTOLIC) 10 MG tablet; Take 1 tablet (10 mg total) by mouth daily. -     indapamide (LOZOL) 1.25 MG tablet; Take 1 tablet (1.25 mg total) by mouth daily. -     Urinalysis, Routine w reflex microscopic -     TSH  Pneumonia due to COVID-19 virus- Based on her symptoms, exam, and chest x-ray there is some improvement but she has a residual nodule in the RLL. I recommended that she undergo a high resolution CT to see if there is any persistent bacterial infection and to screen for fibrosis/pneumonitis. -     DG Chest 2 View; Future -     CT Chest High Resolution; Future  Routine general medical examination at a health care facility- Exam completed, labs reviewed, she refused a flu vaccine, cervical cancer screening is up-to-date, patient education was given.  Severe obesity (BMI >= 40) ([place])- She agrees to work on her lifestyle modifications.  Obstructive sleep apnea syndrome -     Ambulatory referral to Sleep Studies  Dietary folate deficiency anemia -     folic acid (FOLVITE) 1 MG tablet;  Take 1 tablet (1 mg total) by mouth daily.  Lung nodule, multiple -     CT Chest High Resolution; Future   I have discontinued Tia R. Lansberry's mupirocin ointment, guaiFENesin-dextromethorphan,  and methylPREDNISolone. I am also having her start on nebivolol, indapamide, Cholecalciferol, and folic acid. Additionally, I am having her maintain her Pirmella 1/35, tacrolimus, ferrous sulfate, albuterol, and docusate sodium.  Meds ordered this encounter  Medications  . nebivolol (BYSTOLIC) 10 MG tablet    Sig: Take 1 tablet (10 mg total) by mouth daily.    Dispense:  90 tablet    Refill:  0  . indapamide (LOZOL) 1.25 MG tablet    Sig: Take 1 tablet (1.25 mg total) by mouth daily.    Dispense:  90 tablet    Refill:  0  . Cholecalciferol 1.25 MG (50000 UT) capsule    Sig: Take 1 capsule (50,000 Units total) by mouth once a week.    Dispense:  12 capsule    Refill:  1  . folic acid (FOLVITE) 1 MG tablet    Sig: Take 1 tablet (1 mg total) by mouth daily.    Dispense:  90 tablet    Refill:  1   In addition to time spent on CPE, I spent 50 minutes in preparing to see the patient by review of recent labs, imaging and procedures, obtaining and reviewing separately obtained history, communicating with the patient and family or caregiver, ordering medications, tests or procedures, and documenting clinical information in the EHR including the differential Dx, treatment, and any further evaluation and other management of 1. History of Roux-en-Y gastric bypass 2. B12 deficiency 3. Iron deficiency anemia secondary to inadequate dietary iron intake 4. Thiamine deficiency 5. Vitamin D deficiency 6. Chronic zinc deficiency 7. Deficiency anemia 8. Primary hypertension 9. Pneumonia due to COVID-19 virus 10. Severe obesity (BMI >= 40) (HCC) 11. Obstructive sleep apnea syndrome 12. Dietary folate deficiency anemia 13. Lung nodule, multiple     Follow-up: Return in about 4 weeks (around 05/08/2020).  Scarlette Calico, MD

## 2020-04-11 ENCOUNTER — Encounter: Payer: Self-pay | Admitting: Internal Medicine

## 2020-04-11 DIAGNOSIS — R918 Other nonspecific abnormal finding of lung field: Secondary | ICD-10-CM | POA: Insufficient documentation

## 2020-04-11 DIAGNOSIS — D520 Dietary folate deficiency anemia: Secondary | ICD-10-CM | POA: Insufficient documentation

## 2020-04-11 LAB — URINALYSIS, ROUTINE W REFLEX MICROSCOPIC
Bilirubin Urine: NEGATIVE
Hgb urine dipstick: NEGATIVE
Leukocytes,Ua: NEGATIVE
Nitrite: NEGATIVE
Specific Gravity, Urine: 1.02 (ref 1.000–1.030)
Total Protein, Urine: NEGATIVE
Urine Glucose: NEGATIVE
Urobilinogen, UA: 0.2 (ref 0.0–1.0)
pH: 7 (ref 5.0–8.0)

## 2020-04-11 LAB — CBC WITH DIFFERENTIAL/PLATELET
Basophils Absolute: 0.1 10*3/uL (ref 0.0–0.1)
Basophils Relative: 1.7 % (ref 0.0–3.0)
Eosinophils Absolute: 0.3 10*3/uL (ref 0.0–0.7)
Eosinophils Relative: 6 % — ABNORMAL HIGH (ref 0.0–5.0)
HCT: 29.8 % — ABNORMAL LOW (ref 36.0–46.0)
Hemoglobin: 8.5 g/dL — ABNORMAL LOW (ref 12.0–15.0)
Lymphocytes Relative: 26 % (ref 12.0–46.0)
Lymphs Abs: 1.2 10*3/uL (ref 0.7–4.0)
MCHC: 28.6 g/dL — ABNORMAL LOW (ref 30.0–36.0)
MCV: 65.9 fl — ABNORMAL LOW (ref 78.0–100.0)
Monocytes Absolute: 0.5 10*3/uL (ref 0.1–1.0)
Monocytes Relative: 10.8 % (ref 3.0–12.0)
Neutro Abs: 2.5 10*3/uL (ref 1.4–7.7)
Neutrophils Relative %: 55.5 % (ref 43.0–77.0)
Platelets: 194 10*3/uL (ref 150.0–400.0)
RBC: 4.53 Mil/uL (ref 3.87–5.11)
RDW: 24.2 % — ABNORMAL HIGH (ref 11.5–15.5)
WBC: 4.4 10*3/uL (ref 4.0–10.5)

## 2020-04-11 LAB — VITAMIN D 25 HYDROXY (VIT D DEFICIENCY, FRACTURES): VITD: 11.75 ng/mL — ABNORMAL LOW (ref 30.00–100.00)

## 2020-04-11 LAB — TSH: TSH: 1.57 u[IU]/mL (ref 0.35–4.50)

## 2020-04-11 LAB — IRON: Iron: 12 ug/dL — ABNORMAL LOW (ref 42–145)

## 2020-04-11 LAB — FERRITIN: Ferritin: 4.1 ng/mL — ABNORMAL LOW (ref 10.0–291.0)

## 2020-04-11 LAB — VITAMIN B12: Vitamin B-12: 99 pg/mL — ABNORMAL LOW (ref 211–911)

## 2020-04-11 LAB — FOLATE: Folate: 3.4 ng/mL — ABNORMAL LOW (ref >5.9)

## 2020-04-11 MED ORDER — CHOLECALCIFEROL 1.25 MG (50000 UT) PO CAPS: 50000.0000 [IU] | ORAL_CAPSULE | ORAL | 1 refills | Status: DC

## 2020-04-11 MED ORDER — FOLIC ACID 1 MG PO TABS: 1.0000 mg | ORAL_TABLET | Freq: Every day | ORAL | 1 refills | Status: DC

## 2020-04-15 LAB — ZINC

## 2020-04-15 LAB — EXTRA LAV TOP TUBE

## 2020-04-15 LAB — VITAMIN B1: Vitamin B1 (Thiamine): 9 nmol/L (ref 8–30)

## 2020-04-16 DIAGNOSIS — U071 COVID-19: Secondary | ICD-10-CM | POA: Diagnosis not present

## 2020-04-18 ENCOUNTER — Telehealth: Payer: Self-pay | Admitting: Internal Medicine

## 2020-04-18 NOTE — Telephone Encounter (Signed)
  Patient requesting order be faxed to [place] to discontinue home oxygen Patient states she no longer uses oxygen Please fax order to [phone]

## 2020-04-19 ENCOUNTER — Encounter: Payer: Self-pay | Admitting: Internal Medicine

## 2020-04-19 NOTE — Telephone Encounter (Signed)
Letter faxed.

## 2020-04-22 ENCOUNTER — Encounter (HOSPITAL_COMMUNITY): Payer: BC Managed Care – PPO

## 2020-04-25 ENCOUNTER — Other Ambulatory Visit: Payer: Self-pay

## 2020-04-25 ENCOUNTER — Ambulatory Visit (HOSPITAL_COMMUNITY)
Admission: RE | Admit: 2020-04-25 | Discharge: 2020-04-25 | Disposition: A | Discharge status: Home / Self Care | Payer: BC Managed Care – PPO | Source: Ambulatory Visit | Attending: Internal Medicine | Admitting: Internal Medicine

## 2020-04-25 ENCOUNTER — Telehealth: Payer: Self-pay

## 2020-04-25 DIAGNOSIS — D508 Other iron deficiency anemias: Secondary | ICD-10-CM | POA: Diagnosis not present

## 2020-04-25 MED ORDER — SODIUM CHLORIDE 0.9 % IV SOLN
INTRAVENOUS | Status: DC | PRN
  Administered 2020-04-25: 250 mL via INTRAVENOUS

## 2020-04-25 MED ORDER — SODIUM CHLORIDE 0.9 % IV SOLN
750.0000 mg | Freq: Once | INTRAVENOUS | Status: AC
  Administered 2020-04-25: 750 mg via INTRAVENOUS
  Filled 2020-04-25: qty 15

## 2020-04-25 NOTE — Telephone Encounter (Signed)
Verbal order rec'd from Dr Ronnald Ramp for pt to have monthly B12 injections.

## 2020-04-25 NOTE — Discharge Instructions (Signed)

## 2020-04-25 NOTE — Progress Notes (Signed)
Patient received IV injectafer as ordered by Scarlette Calico MD. Observed for at least 30 minutes post infusion.Tolerated well, vitals stable, discharge instructions given, verbalized understanding. Patient alert, oriented and ambulatory at the time of discharge.

## 2020-04-25 NOTE — Telephone Encounter (Signed)
Pt requesting B12 injections to be set up.  Will f/u with PCP for verbal orders.

## 2020-04-26 ENCOUNTER — Encounter (HOSPITAL_COMMUNITY): Payer: BC Managed Care – PPO

## 2020-05-01 ENCOUNTER — Other Ambulatory Visit: Payer: Self-pay | Admitting: Internal Medicine

## 2020-05-01 ENCOUNTER — Telehealth: Payer: Self-pay | Admitting: Internal Medicine

## 2020-05-01 DIAGNOSIS — E538 Deficiency of other specified B group vitamins: Secondary | ICD-10-CM

## 2020-05-01 MED ORDER — CYANOCOBALAMIN 500 MCG/0.1ML NA SOLN: 0.1000 mL | NASAL | 5 refills | Status: DC

## 2020-05-01 NOTE — Telephone Encounter (Signed)
Patient calling stating she is having trouble with appointments because she no longer lives in [place] and is wondering if there is anything equivilant she can have that is like the Vitamin B12 shot.  Patient # [phone]

## 2020-05-02 ENCOUNTER — Ambulatory Visit (HOSPITAL_COMMUNITY)
Admission: RE | Admit: 2020-05-02 | Discharge: 2020-05-02 | Disposition: A | Discharge status: Home / Self Care | Payer: BC Managed Care – PPO | Source: Ambulatory Visit | Attending: Internal Medicine | Admitting: Internal Medicine

## 2020-05-02 ENCOUNTER — Other Ambulatory Visit: Payer: Self-pay

## 2020-05-02 DIAGNOSIS — D508 Other iron deficiency anemias: Secondary | ICD-10-CM | POA: Diagnosis not present

## 2020-05-02 MED ORDER — SODIUM CHLORIDE 0.9 % IV SOLN
750.0000 mg | Freq: Once | INTRAVENOUS | Status: AC
  Administered 2020-05-02: 750 mg via INTRAVENOUS
  Filled 2020-05-02: qty 15

## 2020-05-02 MED ORDER — SODIUM CHLORIDE 0.9 % IV SOLN
INTRAVENOUS | Status: DC | PRN
  Administered 2020-05-02: 250 mL via INTRAVENOUS

## 2020-05-02 NOTE — Discharge Instructions (Signed)

## 2020-05-02 NOTE — Progress Notes (Signed)
Before infusion, patient endorsed some "sleepy legs" at around 04/27/2020, two days after the iron infusion of 04/25/2020. Wanted to know if there was any connection. RN again went over the possible side effects of Injectafer with the patient. RN also called Scarlette Calico MD's office. Spoke with Rhae Lerner. Per MD, it's okay to go ahead with the infusion today. RN informed the patient about what the MD said. Patient verbalized understanding. Injectafer was infused as ordered by Scarlette Calico MD. Observed for at least 30 minutes post infusion.Tolerated well, vitals stable, discharge instructions given, verbalized understanding. Patient alert, oriented and ambulatory at the time of discharge

## 2020-05-07 ENCOUNTER — Ambulatory Visit
Admission: RE | Admit: 2020-05-07 | Discharge: 2020-05-07 | Disposition: A | Discharge status: Home / Self Care | Payer: BC Managed Care – PPO | Source: Ambulatory Visit | Attending: Internal Medicine | Admitting: Internal Medicine

## 2020-05-07 DIAGNOSIS — Z8701 Personal history of pneumonia (recurrent): Secondary | ICD-10-CM | POA: Diagnosis not present

## 2020-05-07 DIAGNOSIS — J984 Other disorders of lung: Secondary | ICD-10-CM | POA: Diagnosis not present

## 2020-05-07 DIAGNOSIS — J1282 Pneumonia due to coronavirus disease 2019: Secondary | ICD-10-CM

## 2020-05-07 DIAGNOSIS — R918 Other nonspecific abnormal finding of lung field: Secondary | ICD-10-CM

## 2020-05-07 DIAGNOSIS — I517 Cardiomegaly: Secondary | ICD-10-CM | POA: Diagnosis not present

## 2020-05-07 DIAGNOSIS — K8080 Other cholelithiasis without obstruction: Secondary | ICD-10-CM | POA: Diagnosis not present

## 2020-05-07 DIAGNOSIS — U071 COVID-19: Secondary | ICD-10-CM

## 2020-05-08 ENCOUNTER — Other Ambulatory Visit: Payer: Self-pay

## 2020-05-08 ENCOUNTER — Encounter: Payer: Self-pay | Admitting: Internal Medicine

## 2020-05-08 ENCOUNTER — Ambulatory Visit: Payer: BC Managed Care – PPO | Admitting: Internal Medicine

## 2020-05-08 VITALS — BP 132/78 | HR 73 | Temp 97.8°F | Resp 16 | Ht 69.0 in | Wt 357.0 lb

## 2020-05-08 DIAGNOSIS — U071 COVID-19: Secondary | ICD-10-CM

## 2020-05-08 DIAGNOSIS — D520 Dietary folate deficiency anemia: Secondary | ICD-10-CM | POA: Diagnosis not present

## 2020-05-08 DIAGNOSIS — E538 Deficiency of other specified B group vitamins: Secondary | ICD-10-CM | POA: Diagnosis not present

## 2020-05-08 DIAGNOSIS — I10 Essential (primary) hypertension: Secondary | ICD-10-CM

## 2020-05-08 DIAGNOSIS — D508 Other iron deficiency anemias: Secondary | ICD-10-CM | POA: Diagnosis not present

## 2020-05-08 DIAGNOSIS — J1282 Pneumonia due to coronavirus disease 2019: Secondary | ICD-10-CM

## 2020-05-08 MED ORDER — CYANOCOBALAMIN 1000 MCG/ML IJ SOLN
1000.0000 ug | Freq: Once | INTRAMUSCULAR | Status: AC
Start: 2020-05-08 — End: 2020-05-08
  Administered 2020-05-08: 1000 ug via INTRAMUSCULAR

## 2020-05-08 NOTE — Progress Notes (Signed)
Subjective:  Patient ID: [name], female    DOB: February 03, 1986  Age: 34 y.o. MRN: [id number]  CC: Anemia and Hypertension  This visit occurred during the SARS-CoV-2 public health emergency.  Safety protocols were in place, including screening questions prior to the visit, additional usage of staff PPE, and extensive cleaning of exam room while observing appropriate contact time as indicated for disinfecting solutions.    HPI [name] presents for f/up -   1.  She continues to recover from the COVID-19 infection.  She tells me her cough continues to improve and is nonproductive.  She complains of fatigue but she denies chest pain, fever, chills, night sweats, hemoptysis, or wheezing.  Her recent high-resolution CT showed minimal postinflammatory fibrosis.  2.  She was recently diagnosed with multiple vitamin deficiencies.  She tells me she is compliant with all of her oral vitamin supplements but comes in today for a B12 injection.  She has received iron infusions.  3.  She tells me her blood pressure is getting better.  She is compliant with nebivolol and indapamide.  She has been working on her lifestyle modifications.  Outpatient Medications Prior to Visit  Medication Sig Dispense Refill  . folic acid (FOLVITE) 1 MG tablet Take 1 tablet (1 mg total) by mouth daily. 90 tablet 1  . indapamide (LOZOL) 1.25 MG tablet Take 1 tablet (1.25 mg total) by mouth daily. 90 tablet 0  . nebivolol (BYSTOLIC) 10 MG tablet Take 1 tablet (10 mg total) by mouth daily. 90 tablet 0  . PIRMELLA 1/35 tablet Take 1 tablet by mouth once daily 84 tablet 6  . albuterol (VENTOLIN HFA) 108 (90 Base) MCG/ACT inhaler Inhale 2 puffs into the lungs every 6 (six) hours as needed for wheezing or shortness of breath. 6.7 g 0  . Cholecalciferol 1.25 MG (50000 UT) capsule Take 1 capsule (50,000 Units total) by mouth once a week. 12 capsule 1  . Cyanocobalamin 500 MCG/0.1ML SOLN Place 0.1 mLs (500 mcg  total) into the nose once a week. 2.3 mL 5  . docusate sodium (COLACE) 100 MG capsule Take 1 capsule (100 mg total) by mouth daily. 30 capsule 0  . ferrous sulfate 325 (65 FE) MG tablet Take 1 tablet (325 mg total) by mouth 2 (two) times daily with a meal. 60 tablet 0  . tacrolimus (PROTOPIC) 0.1 % ointment APPLY  OINTMENT EXTERNALLY TO AFFECTED AREA ONCE DAILY 90 g 2   No facility-administered medications prior to visit.    ROS Review of Systems  Constitutional: Positive for fatigue. Negative for appetite change, chills, diaphoresis and unexpected weight change.  HENT: Negative.   Respiratory: Positive for cough. Negative for chest tightness, shortness of breath and wheezing.   Cardiovascular: Negative for chest pain, palpitations and leg swelling.  Gastrointestinal: Negative for abdominal pain, constipation, diarrhea, nausea and vomiting.  Genitourinary: Negative.  Negative for difficulty urinating.  Musculoskeletal: Negative.  Negative for arthralgias, back pain and neck pain.  Skin: Negative.  Negative for color change and rash.  Neurological: Negative.  Negative for dizziness, weakness and light-headedness.  Hematological: Negative for adenopathy. Does not bruise/bleed easily.  Psychiatric/Behavioral: Negative.     Objective:  BP 132/78   Pulse 73   Temp 97.8 F (36.6 C) (Oral)   Resp 16   Ht 5\' 9"  (1.753 m)   Wt (!) 357 lb (161.9 kg)   LMP 05/07/2020   SpO2 (!) 16%   BMI 52.72 kg/m  BP Readings from Last 3 Encounters:  05/08/20 132/78  05/02/20 (!) 103/58  04/25/20 136/70    Wt Readings from Last 3 Encounters:  05/08/20 (!) 357 lb (161.9 kg)  04/10/20 (!) 360 lb (163.3 kg)  03/14/20 (!) 355 lb (161 kg)    Physical Exam Vitals reviewed.  Constitutional:      General: She is not in acute distress.    Appearance: She is obese. She is not toxic-appearing or diaphoretic.  HENT:     Nose: Nose normal.     Mouth/Throat:     Mouth: Mucous membranes are moist.    Eyes:     General: No scleral icterus.    Conjunctiva/sclera: Conjunctivae normal.  Cardiovascular:     Rate and Rhythm: Normal rate and regular rhythm.     Heart sounds: No murmur heard.   Pulmonary:     Effort: Pulmonary effort is normal.     Breath sounds: No stridor. No wheezing, rhonchi or rales.  Abdominal:     General: Abdomen is protuberant. Bowel sounds are normal. There is no distension.     Palpations: Abdomen is soft. There is no hepatomegaly, splenomegaly or mass.     Tenderness: There is no abdominal tenderness.  Musculoskeletal:        General: Normal range of motion.     Cervical back: Neck supple.     Right lower leg: No edema.     Left lower leg: No edema.  Lymphadenopathy:     Cervical: No cervical adenopathy.  Skin:    General: Skin is warm and dry.     Coloration: Skin is pale.  Neurological:     General: No focal deficit present.     Mental Status: She is alert.  Psychiatric:        Mood and Affect: Mood normal.        Behavior: Behavior normal.     Lab Results  Component Value Date   WBC 4.4 04/10/2020   HGB 8.5 Repeated and verified X2. (L) 04/10/2020   HCT 29.8 (L) 04/10/2020   PLT 194.0 04/10/2020   GLUCOSE 99 03/17/2020   CHOL 162 07/28/2017   TRIG 112 03/13/2020   HDL 64.50 07/28/2017   LDLCALC 64 07/28/2017   ALT 23 03/17/2020   AST 31 03/17/2020   NA 138 03/17/2020   K 3.7 03/17/2020   CL 100 03/17/2020   CREATININE 0.77 03/17/2020   BUN 15 03/17/2020   CO2 29 03/17/2020   TSH 1.57 04/10/2020   HGBA1C 5.1 03/17/2018    CT Chest High Resolution  Result Date: 05/07/2020 CLINICAL DATA:  History of COVID-19 pneumonia and reported pulmonary nodules. EXAM: CT CHEST WITHOUT CONTRAST TECHNIQUE: Multidetector CT imaging of the chest was performed following the standard protocol without intravenous contrast. High resolution imaging of the lungs, as well as inspiratory and expiratory imaging, was performed. COMPARISON:  04/10/2020 chest  radiograph. FINDINGS: Cardiovascular: Mild cardiomegaly. No significant pericardial effusion/thickening. Normal course and caliber of the thoracic aorta. Top-normal caliber pulmonary artery (3.3 cm diameter). Mediastinum/Nodes: No discrete thyroid nodules. Unremarkable esophagus. No pathologically enlarged axillary, mediastinal or hilar lymph nodes, noting limited sensitivity for the detection of hilar adenopathy on this noncontrast study. Lungs/Pleura: No pneumothorax. No pleural effusion. No acute consolidative airspace disease, lung masses or significant pulmonary nodules. Mild patchy air trapping in both lungs on the expiration sequence. No evidence of tracheobronchomalacia. Minimal patchy ground-glass opacities throughout both lungs without significant regions of subpleural reticulation, traction bronchiectasis, architectural  distortion, parenchymal banding or frank honeycombing. 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 Cholelithiasis. Musculoskeletal:  No aggressive appearing focal osseous lesions. IMPRESSION: 1. Minimal patchy ground-glass opacities throughout both lungs without significant regions of reticulation, bronchiectasis or distortion. 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 Consider follow-up high-resolution chest CT study in 12 months to assess temporal pattern stability, as clinically warranted. 2. Mild patchy air trapping in both lungs, indicative of small airways disease. 3. Mild cardiomegaly. 4. Cholelithiasis. Electronically Signed   By: Ilona Sorrel M.D.   On: 05/07/2020 12:58    Assessment & Plan:   Kaziah was seen today for anemia and hypertension.  Diagnoses and all orders for this visit:  Dietary folate deficiency anemia- She is compliant with the folate replacement therapy.  I will monitor her H&H. -     CBC with Differential/Platelet; Future  Iron  deficiency anemia secondary to inadequate dietary iron intake- She is status post 2 iron infusions.  I will monitor her H&H. -     CBC with Differential/Platelet; Future  B12 deficiency- She received an injection of B12 today.  She tells me she is not willing to come back monthly for B12 injections so I recommended that she start using a B12 nasal spray. -     cyanocobalamin ((VITAMIN B-12)) injection 1,000 mcg -     CBC with Differential/Platelet; Future  Pneumonia due to COVID-19 virus- Based on her symptoms and exam she is improving.  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  Will continue to monitor.  Primary hypertension- Her blood pressure is adequately well controlled.  Will continue the combination of indapamide and nebivolol.   I have discontinued Layal R. Colquitt's tacrolimus, ferrous sulfate, albuterol, docusate sodium, Cholecalciferol, and Cyanocobalamin. I am also having her maintain her Pirmella 1/35, nebivolol, indapamide, and folic acid. We administered cyanocobalamin.  Meds ordered this encounter  Medications  . cyanocobalamin ((VITAMIN B-12)) injection 1,000 mcg   I spent 50 minutes in preparing to see the patient by review of recent labs, imaging and procedures, obtaining and reviewing separately obtained history, communicating with the patient and family or caregiver, ordering medications, tests or procedures, and documenting clinical information in the EHR including the differential Dx, treatment, and any further evaluation and other management of 1. Dietary folate deficiency anemia 2. Iron deficiency anemia secondary to inadequate dietary iron intake 3. B12 deficiency 4. Pneumonia due to COVID-19 virus 5. Primary hypertension      Follow-up: Return in about 3 months (around 08/08/2020).  Scarlette Calico, MD

## 2020-05-08 NOTE — Patient Instructions (Signed)
Anemia  Anemia is a condition in which you do not have enough red blood cells or hemoglobin. Hemoglobin is a substance in red blood cells that carries oxygen. When you do not have enough red blood cells or hemoglobin (are anemic), your body cannot get enough oxygen and your organs may not work properly. As a result, you may feel very tired or have other problems. What are the causes? Common causes of anemia include:  Excessive bleeding. Anemia can be caused by excessive bleeding inside or outside the body, including bleeding from the intestine or from periods in women.  Poor nutrition.  Long-lasting (chronic) kidney, thyroid, and liver disease.  Bone marrow disorders.  Cancer and treatments for cancer.  HIV (human immunodeficiency virus) and AIDS (acquired immunodeficiency syndrome).  Treatments for HIV and AIDS.  Spleen problems.  Blood disorders.  Infections, medicines, and autoimmune disorders that destroy red blood cells. What are the signs or symptoms? Symptoms of this condition include:  Minor weakness.  Dizziness.  Headache.  Feeling heartbeats that are irregular or faster than normal (palpitations).  Shortness of breath, especially with exercise.  Paleness.  Cold sensitivity.  Indigestion.  Nausea.  Difficulty sleeping.  Difficulty concentrating. Symptoms may occur suddenly or develop slowly. If your anemia is mild, you may not have symptoms. How is this diagnosed? This condition is diagnosed based on:  Blood tests.  Your medical history.  A physical exam.  Bone marrow biopsy. Your health care provider may also check your stool (feces) for blood and may do additional testing to look for the cause of your bleeding. You may also have other tests, including:  Imaging tests, such as a CT scan or MRI.  Endoscopy.  Colonoscopy. How is this treated? Treatment for this condition depends on the cause. If you continue to lose a lot of blood, you may  need to be treated at a hospital. Treatment may include:  Taking supplements of iron, vitamin S31, or folic acid.  Taking a hormone medicine (erythropoietin) that can help to stimulate red blood cell growth.  Having a blood transfusion. This may be needed if you lose a lot of blood.  Making changes to your diet.  Having surgery to remove your spleen. Follow these instructions at home:  Take over-the-counter and prescription medicines only as told by your health care provider.  Take supplements only as told by your health care provider.  Follow any diet instructions that you were given.  Keep all follow-up visits as told by your health care provider. This is important. Contact a health care provider if:  You develop new bleeding anywhere in the body. Get help right away if:  You are very weak.  You are short of breath.  You have pain in your abdomen or chest.  You are dizzy or feel faint.  You have trouble concentrating.  You have bloody or black, tarry stools.  You vomit repeatedly or you vomit up blood. Summary  Anemia is a condition in which you do not have enough red blood cells or enough of a substance in your red blood cells that carries oxygen (hemoglobin).  Symptoms may occur suddenly or develop slowly.  If your anemia is mild, you may not have symptoms.  This condition is diagnosed with blood tests as well as a medical history and physical exam. Other tests may be needed.  Treatment for this condition depends on the cause of the anemia. This information is not intended to replace advice given to you by  your health care provider. Make sure you discuss any questions you have with your health care provider. Document Revised: 06/04/2017 Document Reviewed: 07/24/2016 Elsevier Patient Education  [employer].

## 2020-05-13 ENCOUNTER — Institutional Professional Consult (permissible substitution): Payer: Self-pay | Admitting: Neurology

## 2020-07-17 ENCOUNTER — Institutional Professional Consult (permissible substitution): Payer: Self-pay | Admitting: Neurology

## 2020-08-26 ENCOUNTER — Institutional Professional Consult (permissible substitution): Payer: Self-pay | Admitting: Neurology

## 2020-10-16 ENCOUNTER — Other Ambulatory Visit: Payer: Self-pay

## 2020-10-16 ENCOUNTER — Telehealth: Payer: Self-pay | Admitting: Internal Medicine

## 2020-10-16 NOTE — Telephone Encounter (Signed)
Patient called needing an appt for paper work to take a leave of absence from work. She said that she has anxiety, depression and stress. Is there a day to double book Dr. Ronnald Ramp or would he be okay with her seeing someone else. Her last OV was 05-08-20. Please advise

## 2020-10-16 NOTE — Telephone Encounter (Signed)
Called pt, she is scheduled for 10:00am tomorrow. She was unable to make any of the listed times today.

## 2020-10-16 NOTE — Telephone Encounter (Signed)
Noted  

## 2020-10-17 ENCOUNTER — Encounter: Payer: Self-pay | Admitting: Internal Medicine

## 2020-10-17 ENCOUNTER — Ambulatory Visit: Payer: BC Managed Care – PPO | Admitting: Internal Medicine

## 2020-10-17 ENCOUNTER — Other Ambulatory Visit: Payer: Self-pay | Admitting: Internal Medicine

## 2020-10-17 VITALS — BP 180/120 | HR 83 | Temp 98.7°F | Resp 16 | Ht 69.0 in | Wt 365.0 lb

## 2020-10-17 DIAGNOSIS — D508 Other iron deficiency anemias: Secondary | ICD-10-CM | POA: Diagnosis not present

## 2020-10-17 DIAGNOSIS — E60 Dietary zinc deficiency: Secondary | ICD-10-CM

## 2020-10-17 DIAGNOSIS — Z0000 Encounter for general adult medical examination without abnormal findings: Secondary | ICD-10-CM

## 2020-10-17 DIAGNOSIS — Z9884 Bariatric surgery status: Secondary | ICD-10-CM

## 2020-10-17 DIAGNOSIS — E538 Deficiency of other specified B group vitamins: Secondary | ICD-10-CM

## 2020-10-17 DIAGNOSIS — Z124 Encounter for screening for malignant neoplasm of cervix: Secondary | ICD-10-CM

## 2020-10-17 DIAGNOSIS — E519 Thiamine deficiency, unspecified: Secondary | ICD-10-CM | POA: Diagnosis not present

## 2020-10-17 DIAGNOSIS — E559 Vitamin D deficiency, unspecified: Secondary | ICD-10-CM

## 2020-10-17 DIAGNOSIS — D520 Dietary folate deficiency anemia: Secondary | ICD-10-CM

## 2020-10-17 DIAGNOSIS — F322 Major depressive disorder, single episode, severe without psychotic features: Secondary | ICD-10-CM | POA: Insufficient documentation

## 2020-10-17 DIAGNOSIS — I10 Essential (primary) hypertension: Secondary | ICD-10-CM | POA: Diagnosis not present

## 2020-10-17 DIAGNOSIS — F5104 Psychophysiologic insomnia: Secondary | ICD-10-CM | POA: Insufficient documentation

## 2020-10-17 LAB — HEPATIC FUNCTION PANEL
ALT: 15 U/L (ref 0–35)
AST: 18 U/L (ref 0–37)
Albumin: 3.6 g/dL (ref 3.5–5.2)
Alkaline Phosphatase: 82 U/L (ref 39–117)
Bilirubin, Direct: 0.1 mg/dL (ref 0.0–0.3)
Total Bilirubin: 0.3 mg/dL (ref 0.2–1.2)
Total Protein: 6.7 g/dL (ref 6.0–8.3)

## 2020-10-17 LAB — CBC WITH DIFFERENTIAL/PLATELET
Basophils Absolute: 0 10*3/uL (ref 0.0–0.1)
Basophils Relative: 0.5 % (ref 0.0–3.0)
Eosinophils Absolute: 0.3 10*3/uL (ref 0.0–0.7)
Eosinophils Relative: 5.4 % — ABNORMAL HIGH (ref 0.0–5.0)
HCT: 38.5 % (ref 36.0–46.0)
Hemoglobin: 12.4 g/dL (ref 12.0–15.0)
Lymphocytes Relative: 24 % (ref 12.0–46.0)
Lymphs Abs: 1.2 10*3/uL (ref 0.7–4.0)
MCHC: 32.1 g/dL (ref 30.0–36.0)
MCV: 92.5 fl (ref 78.0–100.0)
Monocytes Absolute: 0.6 10*3/uL (ref 0.1–1.0)
Monocytes Relative: 11.4 % (ref 3.0–12.0)
Neutro Abs: 2.9 10*3/uL (ref 1.4–7.7)
Neutrophils Relative %: 58.7 % (ref 43.0–77.0)
Platelets: 241 10*3/uL (ref 150.0–400.0)
RBC: 4.16 Mil/uL (ref 3.87–5.11)
RDW: 18.8 % — ABNORMAL HIGH (ref 11.5–15.5)
WBC: 5 10*3/uL (ref 4.0–10.5)

## 2020-10-17 LAB — URINALYSIS, ROUTINE W REFLEX MICROSCOPIC
Bilirubin Urine: NEGATIVE
Hgb urine dipstick: NEGATIVE
Ketones, ur: NEGATIVE
Leukocytes,Ua: NEGATIVE
Nitrite: NEGATIVE
Specific Gravity, Urine: 1.015 (ref 1.000–1.030)
Total Protein, Urine: NEGATIVE
Urine Glucose: NEGATIVE
Urobilinogen, UA: 0.2 (ref 0.0–1.0)
pH: 8 (ref 5.0–8.0)

## 2020-10-17 LAB — VITAMIN B12: Vitamin B-12: 82 pg/mL — ABNORMAL LOW (ref 211–911)

## 2020-10-17 LAB — FERRITIN: Ferritin: 11.6 ng/mL (ref 10.0–291.0)

## 2020-10-17 LAB — BASIC METABOLIC PANEL
BUN: 8 mg/dL (ref 6–23)
CO2: 26 mEq/L (ref 19–32)
Calcium: 8.5 mg/dL (ref 8.4–10.5)
Chloride: 102 mEq/L (ref 96–112)
Creatinine, Ser: 0.6 mg/dL (ref 0.40–1.20)
GFR: 116.41 mL/min (ref >60.00)
Glucose, Bld: 68 mg/dL — ABNORMAL LOW (ref 70–99)
Potassium: 3.4 mEq/L — ABNORMAL LOW (ref 3.5–5.1)
Sodium: 140 mEq/L (ref 135–145)

## 2020-10-17 LAB — LDL CHOLESTEROL, DIRECT: Direct LDL: 87 mg/dL

## 2020-10-17 LAB — LIPID PANEL
Cholesterol: 188 mg/dL (ref 0–200)
HDL: 92.8 mg/dL (ref >39.00)
NonHDL: 95.29
Total CHOL/HDL Ratio: 2
Triglycerides: 208 mg/dL — ABNORMAL HIGH (ref 0.0–149.0)
VLDL: 41.6 mg/dL — ABNORMAL HIGH (ref 0.0–40.0)

## 2020-10-17 LAB — HEMOGLOBIN A1C: Hgb A1c MFr Bld: 4.9 % (ref 4.6–6.5)

## 2020-10-17 LAB — FOLATE: Folate: 3.8 ng/mL — ABNORMAL LOW (ref >5.9)

## 2020-10-17 LAB — IRON: Iron: 22 ug/dL — ABNORMAL LOW (ref 42–145)

## 2020-10-17 LAB — VITAMIN D 25 HYDROXY (VIT D DEFICIENCY, FRACTURES): VITD: <7 ng/mL — ABNORMAL LOW (ref 30.00–100.00)

## 2020-10-17 LAB — TSH: TSH: 1 u[IU]/mL (ref 0.35–4.50)

## 2020-10-17 MED ORDER — FOLIC ACID 1 MG PO TABS: 1.0000 mg | ORAL_TABLET | Freq: Every day | ORAL | 1 refills | Status: DC

## 2020-10-17 MED ORDER — VORTIOXETINE HBR 5 MG PO TABS: 5.0000 mg | ORAL_TABLET | Freq: Every day | ORAL | 0 refills | Status: DC

## 2020-10-17 MED ORDER — ACCRUFER 30 MG PO CAPS: 1.0000 | ORAL_CAPSULE | Freq: Two times a day (BID) | ORAL | 1 refills | Status: DC

## 2020-10-17 MED ORDER — NEBIVOLOL HCL 10 MG PO TABS: 10.0000 mg | ORAL_TABLET | Freq: Every day | ORAL | 1 refills | Status: DC

## 2020-10-17 MED ORDER — CHOLECALCIFEROL 1.25 MG (50000 UT) PO CAPS
50000.0000 [IU] | ORAL_CAPSULE | ORAL | 1 refills | Status: DC
Start: 2020-10-17 — End: 2021-10-01

## 2020-10-17 MED ORDER — ESZOPICLONE 2 MG PO TABS: 2.0000 mg | ORAL_TABLET | Freq: Every evening | ORAL | 0 refills | Status: DC | PRN

## 2020-10-17 MED ORDER — TRIAMTERENE-HCTZ 37.5-25 MG PO CAPS: 1.0000 | ORAL_CAPSULE | Freq: Every day | ORAL | 1 refills | Status: DC

## 2020-10-17 NOTE — Progress Notes (Signed)
Subjective:  Patient ID: [name], female    DOB: 1986-02-13  Age: 35 y.o. MRN: [id number]  CC: Hypertension, Annual Exam, Depression, and Anemia  This visit occurred during the SARS-CoV-2 public health emergency.  Safety protocols were in place, including screening questions prior to the visit, additional usage of staff PPE, and extensive cleaning of exam room while observing appropriate contact time as indicated for disinfecting solutions.    HPI Rosaura Bolon presents for a CPX.  She complains of a several month history of fatigue, insomnia, and signs and symptoms of depression - She feels overwhelmed, hopeless, and helpless.  She denies SI or HI.  She said she wants some time off of work to get herself together.  She tells me she has not taken any blood pressure medicines for 2 months and has not been taking her vitamin supplements.  Outpatient Medications Prior to Visit  Medication Sig Dispense Refill  . PIRMELLA 1/35 tablet Take 1 tablet by mouth once daily 84 tablet 6  . folic acid (FOLVITE) 1 MG tablet Take 1 tablet (1 mg total) by mouth daily. (Patient not taking: Reported on 10/17/2020) 90 tablet 1  . indapamide (LOZOL) 1.25 MG tablet Take 1 tablet (1.25 mg total) by mouth daily. (Patient not taking: Reported on 10/17/2020) 90 tablet 0  . nebivolol (BYSTOLIC) 10 MG tablet Take 1 tablet (10 mg total) by mouth daily. (Patient not taking: Reported on 10/17/2020) 90 tablet 0   No facility-administered medications prior to visit.    ROS Review of Systems  Constitutional: Positive for appetite change, fatigue and unexpected weight change (wt gain). Negative for activity change and diaphoresis.  HENT: Negative.   Eyes: Negative.   Respiratory: Negative for apnea, cough, chest tightness, shortness of breath and wheezing.   Cardiovascular: Negative for chest pain, palpitations and leg swelling.  Gastrointestinal: Negative for abdominal pain, constipation, diarrhea,  nausea and vomiting.  Endocrine: Negative.   Genitourinary: Negative.  Negative for difficulty urinating, dysuria and hematuria.  Musculoskeletal: Negative.   Skin: Negative.  Negative for color change and pallor.  Neurological: Negative.  Negative for dizziness, weakness, light-headedness, numbness and headaches.  Hematological: Negative for adenopathy. Does not bruise/bleed easily.  Psychiatric/Behavioral: Positive for decreased concentration, dysphoric mood and sleep disturbance. Negative for agitation, behavioral problems, confusion, hallucinations, self-injury and suicidal ideas. The patient is not nervous/anxious and is not hyperactive.     Objective:  BP (!) 180/120 (BP Location: Left Arm, Patient Position: Sitting, Cuff Size: Large)   Pulse 83   Temp 98.7 F (37.1 C) (Oral)   Resp 16   Ht 5\' 9"  (1.753 m)   Wt (!) 365 lb (165.6 kg)   LMP 09/26/2020 (Approximate)   SpO2 97%   BMI 53.90 kg/m   BP Readings from Last 3 Encounters:  10/17/20 (!) 180/120  05/08/20 132/78  05/02/20 (!) 103/58    Wt Readings from Last 3 Encounters:  10/17/20 (!) 365 lb (165.6 kg)  05/08/20 (!) 357 lb (161.9 kg)  04/10/20 (!) 360 lb (163.3 kg)    Physical Exam Vitals reviewed.  Constitutional:      Appearance: She is obese.  HENT:     Nose: Nose normal.     Mouth/Throat:     Mouth: Mucous membranes are moist.  Eyes:     General: No scleral icterus.    Conjunctiva/sclera: Conjunctivae normal.  Cardiovascular:     Rate and Rhythm: Normal rate and regular rhythm.     Heart  sounds: No murmur heard.   Pulmonary:     Effort: Pulmonary effort is normal.     Breath sounds: No stridor. No wheezing, rhonchi or rales.  Abdominal:     General: Abdomen is protuberant. Bowel sounds are normal. There is no distension.     Palpations: There is no fluid wave, hepatomegaly, splenomegaly or mass.     Tenderness: There is no abdominal tenderness.     Hernia: No hernia is present.   Musculoskeletal:        General: Normal range of motion.     Cervical back: Neck supple.     Right lower leg: No edema.     Left lower leg: No edema.  Lymphadenopathy:     Cervical: No cervical adenopathy.  Skin:    General: Skin is warm and dry.     Coloration: Skin is not pale.     Findings: No rash.  Neurological:     General: No focal deficit present.     Mental Status: She is alert and oriented to person, place, and time. Mental status is at baseline.  Psychiatric:        Attention and Perception: Attention normal.        Mood and Affect: Mood is depressed. Mood is not anxious. Affect is tearful. Affect is not blunt or angry.        Speech: Speech normal. She is communicative. Speech is not delayed or tangential.        Behavior: Behavior is slowed. Behavior is not agitated or withdrawn. Behavior is cooperative.        Thought Content: Thought content normal. Thought content is not paranoid or delusional. Thought content does not include homicidal or suicidal ideation.        Cognition and Memory: Cognition normal.        Judgment: Judgment normal.     Lab Results  Component Value Date   WBC 5.0 10/17/2020   HGB 12.4 10/17/2020   HCT 38.5 10/17/2020   PLT 241.0 10/17/2020   GLUCOSE 68 (L) 10/17/2020   CHOL 188 10/17/2020   TRIG 208.0 (H) 10/17/2020   HDL 92.80 10/17/2020   LDLDIRECT 87.0 10/17/2020   LDLCALC 64 07/28/2017   ALT 15 10/17/2020   AST 18 10/17/2020   NA 140 10/17/2020   K 3.4 (L) 10/17/2020   CL 102 10/17/2020   CREATININE 0.60 10/17/2020   BUN 8 10/17/2020   CO2 26 10/17/2020   TSH 1.00 10/17/2020   HGBA1C 4.9 10/17/2020    CT Chest High Resolution  Result Date: 05/07/2020 CLINICAL DATA:  History of COVID-19 pneumonia and reported pulmonary nodules. EXAM: CT CHEST WITHOUT CONTRAST TECHNIQUE: Multidetector CT imaging of the chest was performed following the standard protocol without intravenous contrast. High resolution imaging of the lungs, as  well as inspiratory and expiratory imaging, was performed. COMPARISON:  04/10/2020 chest radiograph. FINDINGS: Cardiovascular: Mild cardiomegaly. No significant pericardial effusion/thickening. Normal course and caliber of the thoracic aorta. Top-normal caliber pulmonary artery (3.3 cm diameter). Mediastinum/Nodes: No discrete thyroid nodules. Unremarkable esophagus. No pathologically enlarged axillary, mediastinal or hilar lymph nodes, noting limited sensitivity for the detection of hilar adenopathy on this noncontrast study. Lungs/Pleura: No pneumothorax. No pleural effusion. No acute consolidative airspace disease, lung masses or significant pulmonary nodules. Mild patchy air trapping in both lungs on the expiration sequence. No evidence of tracheobronchomalacia. Minimal patchy ground-glass opacities throughout both lungs without significant regions of subpleural reticulation, traction bronchiectasis, architectural distortion, parenchymal banding or  frank honeycombing. 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 Cholelithiasis. Musculoskeletal:  No aggressive appearing focal osseous lesions. IMPRESSION: 1. Minimal patchy ground-glass opacities throughout both lungs without significant regions of reticulation, bronchiectasis or distortion. 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 Consider follow-up high-resolution chest CT study in 12 months to assess temporal pattern stability, as clinically warranted. 2. Mild patchy air trapping in both lungs, indicative of small airways disease. 3. Mild cardiomegaly. 4. Cholelithiasis. Electronically Signed   By: Ilona Sorrel M.D.   On: 05/07/2020 12:58    Assessment & Plan:   Marvetta was seen today for hypertension, annual exam, depression and anemia.  Diagnoses and all orders for this visit:  B12 deficiency- She agrees to restart parenteral B12  replacement therapy. -     CBC with Differential/Platelet; Future -     Folate; Future -     Vitamin B12; Future -     Vitamin B12 -     Folate -     CBC with Differential/Platelet  Primary hypertension- Her blood pressure is not adequately well controlled due to noncompliance.  Will restart nebivolol, triamterene, and hydrochlorothiazide. -     nebivolol (BYSTOLIC) 10 MG tablet; Take 1 tablet (10 mg total) by mouth daily. -     triamterene-hydrochlorothiazide (DYAZIDE) 37.5-25 MG capsule; Take 1 each (1 capsule total) by mouth daily. -     CBC with Differential/Platelet; Future -     Basic metabolic panel; Future -     Urinalysis, Routine w reflex microscopic; Future -     TSH; Future -     Hepatic function panel; Future -     Hepatic function panel -     TSH -     Urinalysis, Routine w reflex microscopic -     Basic metabolic panel -     CBC with Differential/Platelet  Chronic zinc deficiency- I will monitor her zinc level. -     CBC with Differential/Platelet; Future -     Zinc; Future -     Zinc -     CBC with Differential/Platelet  Dietary folate deficiency anemia- Her folate level is low.  I have asked her to restart the folate supplement. -     CBC with Differential/Platelet; Future -     Folate; Future -     Folate -     CBC with Differential/Platelet -     folic acid (FOLVITE) 1 MG tablet; Take 1 tablet (1 mg total) by mouth daily.  History of Roux-en-Y gastric bypass -     CBC with Differential/Platelet; Future -     Zinc; Future -     VITAMIN D 25 Hydroxy (Vit-D Deficiency, Fractures); Future -     Vitamin B1; Future -     Folate; Future -     Vitamin B12; Future -     Iron; Future -     Ferritin; Future -     Ferritin -     Iron -     Vitamin B12 -     Folate -     Vitamin B1 -     VITAMIN D 25 Hydroxy (Vit-D Deficiency, Fractures) -     Zinc -     CBC with Differential/Platelet -     Cholecalciferol 1.25 MG (50000 UT) capsule; Take 1 capsule (50,000  Units total) by mouth once a week. -     Ferric Maltol (ACCRUFER)  30 MG CAPS; Take 1 capsule by mouth in the morning and at bedtime.  Iron deficiency anemia secondary to inadequate dietary iron intake- Her iron level is low but she is not anemic.  I recommended that she restart an iron supplement. -     Iron; Future -     Ferritin; Future -     Ferritin -     Iron -     Ferric Maltol (ACCRUFER) 30 MG CAPS; Take 1 capsule by mouth in the morning and at bedtime.  Routine general medical examination at a health care facility- Exam completed, labs reviewed, vaccines are up-to-date, cancer screenings addressed, patient education was given. -     Lipid panel; Future -     Lipid panel  Thiamine deficiency- I will monitor her thiamine level. -     CBC with Differential/Platelet; Future -     Vitamin B1; Future -     Vitamin B1 -     CBC with Differential/Platelet  Vitamin D deficiency -     VITAMIN D 25 Hydroxy (Vit-D Deficiency, Fractures); Future -     VITAMIN D 25 Hydroxy (Vit-D Deficiency, Fractures) -     Cholecalciferol 1.25 MG (50000 UT) capsule; Take 1 capsule (50,000 Units total) by mouth once a week.  Severe obesity (BMI >= 40) (HCC) -     Hemoglobin A1c; Future -     Hemoglobin A1c  Current severe episode of major depressive disorder without psychotic features without prior episode ([place])- I recommended that she stay out of work for the next 6 weeks.  Will start treating this with trintellix. -     vortioxetine HBr (TRINTELLIX) 5 MG TABS tablet; Take 1 tablet (5 mg total) by mouth daily.  Psychophysiological insomnia -     eszopiclone (LUNESTA) 2 MG TABS tablet; Take 1 tablet (2 mg total) by mouth at bedtime as needed for sleep. Take immediately before bedtime  Cervical cancer screening -     Ambulatory referral to Gynecology  Other orders -     LDL cholesterol, direct   I have discontinued Helen R. Schild's indapamide. I am also having her start on  triamterene-hydrochlorothiazide, vortioxetine HBr, eszopiclone, Cholecalciferol, and ACCRUFeR. Additionally, I am having her maintain her Pirmella 1/35, nebivolol, and folic acid.  Meds ordered this encounter  Medications  . nebivolol (BYSTOLIC) 10 MG tablet    Sig: Take 1 tablet (10 mg total) by mouth daily.    Dispense:  90 tablet    Refill:  1  . triamterene-hydrochlorothiazide (DYAZIDE) 37.5-25 MG capsule    Sig: Take 1 each (1 capsule total) by mouth daily.    Dispense:  90 capsule    Refill:  1  . vortioxetine HBr (TRINTELLIX) 5 MG TABS tablet    Sig: Take 1 tablet (5 mg total) by mouth daily.    Dispense:  30 tablet    Refill:  0  . eszopiclone (LUNESTA) 2 MG TABS tablet    Sig: Take 1 tablet (2 mg total) by mouth at bedtime as needed for sleep. Take immediately before bedtime    Dispense:  90 tablet    Refill:  0  . folic acid (FOLVITE) 1 MG tablet    Sig: Take 1 tablet (1 mg total) by mouth daily.    Dispense:  90 tablet    Refill:  1  . Cholecalciferol 1.25 MG (50000 UT) capsule    Sig: Take 1 capsule (50,000 Units total) by mouth once a  week.    Dispense:  12 capsule    Refill:  1  . Ferric Maltol (ACCRUFER) 30 MG CAPS    Sig: Take 1 capsule by mouth in the morning and at bedtime.    Dispense:  180 capsule    Refill:  1     Follow-up: Return in about 6 weeks (around 11/28/2020).  Scarlette Calico, MD

## 2020-10-17 NOTE — Telephone Encounter (Signed)
I received LOA forms for [employer]. Leave - 10/17/2020 to 12/03/2020  Forms have been completed and Placed in providers box to review and sign.

## 2020-10-17 NOTE — Patient Instructions (Signed)

## 2020-10-19 ENCOUNTER — Encounter: Payer: Self-pay | Admitting: Internal Medicine

## 2020-10-21 ENCOUNTER — Ambulatory Visit: Payer: BC Managed Care – PPO

## 2020-10-22 DIAGNOSIS — Z0279 Encounter for issue of other medical certificate: Secondary | ICD-10-CM

## 2020-10-22 NOTE — Telephone Encounter (Signed)
Forms have been signed, Faxed to [place], Copy sent to scan &Charged for.   Patient's VM is full. Mychart sent to inform patient and original mailed to patient for her records.

## 2020-10-25 LAB — VITAMIN B1: Vitamin B1 (Thiamine): 12 nmol/L (ref 8–30)

## 2020-10-25 LAB — ZINC: Zinc: 77 ug/dL (ref 60–130)

## 2020-11-11 ENCOUNTER — Encounter: Payer: Self-pay | Admitting: Nurse Practitioner

## 2020-11-12 ENCOUNTER — Encounter: Payer: BC Managed Care – PPO | Admitting: Nurse Practitioner

## 2020-11-12 DIAGNOSIS — Z0289 Encounter for other administrative examinations: Secondary | ICD-10-CM

## 2020-12-16 ENCOUNTER — Ambulatory Visit: Payer: BC Managed Care – PPO | Admitting: Dermatology

## 2021-01-08 ENCOUNTER — Telehealth: Payer: Self-pay | Admitting: Internal Medicine

## 2021-01-08 ENCOUNTER — Other Ambulatory Visit: Payer: Self-pay | Admitting: Internal Medicine

## 2021-01-08 NOTE — Telephone Encounter (Signed)
1.Medication Requested: albuterol (VENTOLIN HFA) 108 (90 Base) MCG/ACT inhaler   2. Pharmacy (Name, Street, [place]): [id number] - [place], [address]  3. On Med List: no   4. Last Visit with PCP: 10-17-20  5. Next visit date with PCP: n/a   Agent: Please be advised that RX refills may take up to 3 business days. We ask that you follow-up with your pharmacy.  Patient also was wondering if she can drop off paper work that allows her to work from home. She was wondering if she needed an appointment. Please advise

## 2021-01-09 ENCOUNTER — Other Ambulatory Visit: Payer: Self-pay | Admitting: Internal Medicine

## 2021-01-09 DIAGNOSIS — J449 Chronic obstructive pulmonary disease, unspecified: Secondary | ICD-10-CM

## 2021-01-09 MED ORDER — ALBUTEROL SULFATE HFA 108 (90 BASE) MCG/ACT IN AERS
2.0000 | INHALATION_SPRAY | Freq: Four times a day (QID) | RESPIRATORY_TRACT | 3 refills | Status: DC | PRN
Start: 2021-01-09 — End: 2021-01-09

## 2021-01-09 NOTE — Telephone Encounter (Signed)
Pt stated that she had COVID back in September and was hospitalized. It was explained to her that she had long haulers COVID. She same it was Rx'd to her for those reasons. She only uses it when she needs to but since its been hot and she has being active she been needing it more recently.   She also would like to know if she would need an OV to support paperwork for an accomodation for her job. Please advise.

## 2021-01-10 ENCOUNTER — Telehealth: Payer: Self-pay | Admitting: Internal Medicine

## 2021-01-10 NOTE — Telephone Encounter (Signed)
   Patient called and said that she is needing an accomodation letter to work from home because of her health. Patient said that her work does not require masks and she said that it makes her uncomfortable and she does not want to get sick. She is request that the letter be written for a year. She can be reached at [phone]. Please advise

## 2021-01-15 ENCOUNTER — Encounter: Payer: Self-pay | Admitting: Internal Medicine

## 2021-01-15 NOTE — Telephone Encounter (Signed)
  Follow up  Please fax letter to [employer] accommodations  Fax [phone]

## 2021-01-16 NOTE — Telephone Encounter (Signed)
Letter has been faxed.

## 2021-01-19 ENCOUNTER — Other Ambulatory Visit: Payer: Self-pay | Admitting: Internal Medicine

## 2021-01-19 DIAGNOSIS — D520 Dietary folate deficiency anemia: Secondary | ICD-10-CM

## 2021-01-20 ENCOUNTER — Ambulatory Visit: Payer: BC Managed Care – PPO | Admitting: Dermatology

## 2021-01-20 ENCOUNTER — Other Ambulatory Visit: Payer: Self-pay

## 2021-01-20 ENCOUNTER — Encounter: Payer: Self-pay | Admitting: Dermatology

## 2021-01-20 DIAGNOSIS — L439 Lichen planus, unspecified: Secondary | ICD-10-CM | POA: Diagnosis not present

## 2021-01-20 MED ORDER — TRI-LUMA 0.01-4-0.05 % EX CREA: TOPICAL_CREAM | CUTANEOUS | 2 refills | Status: DC

## 2021-01-27 ENCOUNTER — Telehealth: Payer: Self-pay

## 2021-01-27 NOTE — Telephone Encounter (Signed)
pt is asking can an accomodation letter be sent into her job via fax [phone]. Pt has stated that she can not e-mail or fax fax over the letter that was sent to via [place] and her job is asking that the letter be faxed.

## 2021-01-27 NOTE — Telephone Encounter (Signed)
Letter has been faxed again but to number listed below

## 2021-01-28 ENCOUNTER — Encounter: Payer: Self-pay | Admitting: Dermatology

## 2021-01-28 NOTE — Progress Notes (Signed)
   Follow-Up Visit   Subjective  [name] is a 35 y.o. female who presents for the following: Skin Problem (Dr Mennie Spiller was going to give her a cream for dark spots on legs and belly once spots healed. All spots have healed so patient wants cream).  Rash on legs and abdomen Location:  Duration:  Quality: Improved but left dark spots Associated Signs/Symptoms: Modifying Factors:  Severity:  Timing: Context:   Objective  Well appearing patient in no apparent distress; mood and affect are within normal limits. Left Lower Leg - Anterior, Right Lower Leg - Anterior Active inflammation has largely cleared.  Moderately severe PIH.    A focused examination was performed including arms, legs, abdomen. Relevant physical exam findings are noted in the Assessment and Plan.   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  She did want to proceed to at least try something so prescription for Tri-Luma was given.  This is applied daily for minimum of 10 weeks.  #50+ SPF sunblock each morning.  Related Medications Fluocin-Hydroquinone-Tretinoin (TRI-LUMA) 0.01-4-0.05 % CREA Apply to affected area qd      I, Lavonna Monarch, MD, have reviewed all documentation for this visit.  The documentation on 01/28/21 for the exam, diagnosis, procedures, and orders are all accurate and complete.

## 2021-01-31 ENCOUNTER — Telehealth: Payer: Self-pay

## 2021-01-31 DIAGNOSIS — Z0279 Encounter for issue of other medical certificate: Secondary | ICD-10-CM

## 2021-01-31 NOTE — Telephone Encounter (Signed)
Forms have been signed and faxed back to [place].   Copy has been sent to scan Copy given to charge  Original has been filed with [place]

## 2021-01-31 NOTE — Telephone Encounter (Signed)
Capabilities and Limitations Form have been received by [employer].  Form has been completed and given to PCP for review and signature.

## 2021-02-18 ENCOUNTER — Telehealth: Payer: Self-pay

## 2021-02-18 NOTE — Telephone Encounter (Signed)
Type of form received: FMLA  Received by: Erick Colace  Form should be Faxed to: [phone]  Form should be mailed to:  N/A  Is patient requesting call for pickup: [phone]   Form placed in the Provider's box.   Was patient informed of  7-10 business day turn around (Y/N)?   Patient requesting end date to be a year and for the form to state it is medically necessary to work from home due to long covid and a low immune system.

## 2021-02-19 DIAGNOSIS — Z0279 Encounter for issue of other medical certificate: Secondary | ICD-10-CM

## 2021-02-19 NOTE — Telephone Encounter (Signed)
Form has been completed and given to PCP for review and signature.

## 2021-02-19 NOTE — Telephone Encounter (Signed)
Please fax accomodation  form to [place], attn: Colon Flattery Fax [phone]

## 2021-02-19 NOTE — Telephone Encounter (Signed)
Forms have been faxed 

## 2021-02-19 NOTE — Telephone Encounter (Signed)
Form has been signed  Pt has been informed. Pt will call call back to provide a fax number that forms should be sent to.  Copy sent to scan Copy given to charge Copy sent to pt Original filed with [facility]

## 2021-09-29 ENCOUNTER — Emergency Department (HOSPITAL_COMMUNITY): Payer: BC Managed Care – PPO

## 2021-09-29 ENCOUNTER — Encounter (HOSPITAL_COMMUNITY): Payer: Self-pay | Admitting: Emergency Medicine

## 2021-09-29 ENCOUNTER — Emergency Department (HOSPITAL_COMMUNITY)
Admission: EM | Admit: 2021-09-29 | Discharge: 2021-09-29 | Discharge status: Against Medical Advice | Payer: BC Managed Care – PPO | Attending: Emergency Medicine | Admitting: Emergency Medicine

## 2021-09-29 ENCOUNTER — Other Ambulatory Visit: Payer: Self-pay

## 2021-09-29 DIAGNOSIS — Z5321 Procedure and treatment not carried out due to patient leaving prior to being seen by health care provider: Secondary | ICD-10-CM | POA: Diagnosis not present

## 2021-09-29 DIAGNOSIS — R252 Cramp and spasm: Secondary | ICD-10-CM | POA: Insufficient documentation

## 2021-09-29 DIAGNOSIS — R202 Paresthesia of skin: Secondary | ICD-10-CM | POA: Diagnosis not present

## 2021-09-29 DIAGNOSIS — R29818 Other symptoms and signs involving the nervous system: Secondary | ICD-10-CM | POA: Diagnosis not present

## 2021-09-29 LAB — CBC WITH DIFFERENTIAL/PLATELET
Abs Immature Granulocytes: 0.03 10*3/uL (ref 0.00–0.07)
Basophils Absolute: 0 10*3/uL (ref 0.0–0.1)
Basophils Relative: 0 %
Eosinophils Absolute: 0.3 10*3/uL (ref 0.0–0.5)
Eosinophils Relative: 4 %
HCT: 38.6 % (ref 36.0–46.0)
Hemoglobin: 12.2 g/dL (ref 12.0–15.0)
Immature Granulocytes: 1 %
Lymphocytes Relative: 14 %
Lymphs Abs: 0.8 10*3/uL (ref 0.7–4.0)
MCH: 30.8 pg (ref 26.0–34.0)
MCHC: 31.6 g/dL (ref 30.0–36.0)
MCV: 97.5 fL (ref 80.0–100.0)
Monocytes Absolute: 0.6 10*3/uL (ref 0.1–1.0)
Monocytes Relative: 9 %
Neutro Abs: 4.4 10*3/uL (ref 1.7–7.7)
Neutrophils Relative %: 72 %
Platelets: 155 10*3/uL (ref 150–400)
RBC: 3.96 MIL/uL (ref 3.87–5.11)
RDW: 20.1 % — ABNORMAL HIGH (ref 11.5–15.5)
WBC: 6.1 10*3/uL (ref 4.0–10.5)
nRBC: 0 % (ref 0.0–0.2)

## 2021-09-29 LAB — BASIC METABOLIC PANEL
Anion gap: 12 (ref 5–15)
BUN: 6 mg/dL (ref 6–20)
CO2: 25 mmol/L (ref 22–32)
Calcium: 6.9 mg/dL — ABNORMAL LOW (ref 8.9–10.3)
Chloride: 101 mmol/L (ref 98–111)
Creatinine, Ser: 0.8 mg/dL (ref 0.44–1.00)
GFR, Estimated: >60 mL/min (ref >60)
Glucose, Bld: 85 mg/dL (ref 70–99)
Potassium: 2.6 mmol/L — CL (ref 3.5–5.1)
Sodium: 138 mmol/L (ref 135–145)

## 2021-09-29 LAB — I-STAT BETA HCG BLOOD, ED (MC, WL, AP ONLY): I-stat hCG, quantitative: <5 m[IU]/mL (ref <5)

## 2021-09-29 NOTE — ED Provider Triage Note (Signed)
Emergency Medicine Provider Triage Evaluation Note ? ?[name] , a 36 y.o. female  was evaluated in triage.  Pt complains of tingling sensation to bilateral upper and lower extremities as well as right hand cramping.  Patient reports that she has had tingling station to bilateral upper and lower extremities throughout the entire day.  At 4 PM her right hand became contracted and she has been able to move it since.  Denies any recent falls or traumatic injuries. ? ?Review of Systems  ?Positive: Paresthesias, cramping ?Negative: Facial asymmetry, dysarthria, visual disturbance, ? ?Physical Exam  ?BP (!) 148/87   Pulse 73   Temp 98.7 ?F (37.1 ?C)   Resp 20   SpO2 98%  ?Gen:   Awake, no distress   ?Resp:  Normal effort  ?MSK:   Moves extremities without difficulty  ?Other:  Patient is right-hand is flexed.  Cap refill less than 2 seconds in all digits of right hand.  Sensation intact to all digit of right hand, +2 radial pulse.  Patient has +5 strength to bilateral lower extremities.  CN II through XII intact. ? ?Medical Decision Making  ?Medically screening exam initiated at 7:12 PM.  Appropriate orders placed.  Virgel Gess Derenzo was informed that the remainder of the evaluation will be completed by another provider, this initial triage assessment does not replace that evaluation, and the importance of remaining in the ED until their evaluation is complete. ? ? ?  ?Loni Beckwith, PA-C ?09/29/21 1913 ? ?

## 2021-09-29 NOTE — ED Notes (Signed)
Pt did not want to stay left AMA. ?

## 2021-09-29 NOTE — ED Triage Notes (Signed)
Patient coming from home, complaint of arm tingling and bilateral leg tingling off and on all day, also endorses stiffness to right hand that began approx 4 pm today.  ?

## 2021-10-01 ENCOUNTER — Telehealth: Payer: Self-pay

## 2021-10-01 ENCOUNTER — Encounter: Payer: Self-pay | Admitting: Family Medicine

## 2021-10-01 ENCOUNTER — Ambulatory Visit: Payer: BC Managed Care – PPO | Admitting: Family Medicine

## 2021-10-01 VITALS — BP 144/88 | HR 82 | Temp 98.7°F | Ht 69.0 in | Wt 376.0 lb

## 2021-10-01 DIAGNOSIS — R202 Paresthesia of skin: Secondary | ICD-10-CM | POA: Insufficient documentation

## 2021-10-01 DIAGNOSIS — F410 Panic disorder [episodic paroxysmal anxiety] without agoraphobia: Secondary | ICD-10-CM | POA: Insufficient documentation

## 2021-10-01 DIAGNOSIS — R200 Anesthesia of skin: Secondary | ICD-10-CM | POA: Insufficient documentation

## 2021-10-01 DIAGNOSIS — F419 Anxiety disorder, unspecified: Secondary | ICD-10-CM | POA: Insufficient documentation

## 2021-10-01 DIAGNOSIS — R197 Diarrhea, unspecified: Secondary | ICD-10-CM

## 2021-10-01 DIAGNOSIS — R252 Cramp and spasm: Secondary | ICD-10-CM | POA: Insufficient documentation

## 2021-10-01 DIAGNOSIS — E876 Hypokalemia: Secondary | ICD-10-CM | POA: Insufficient documentation

## 2021-10-01 DIAGNOSIS — I10 Essential (primary) hypertension: Secondary | ICD-10-CM | POA: Diagnosis not present

## 2021-10-01 LAB — BASIC METABOLIC PANEL
BUN: 6 mg/dL (ref 6–23)
CO2: 28 mEq/L (ref 19–32)
Calcium: 7.2 mg/dL — ABNORMAL LOW (ref 8.4–10.5)
Chloride: 100 mEq/L (ref 96–112)
Creatinine, Ser: 0.52 mg/dL (ref 0.40–1.20)
GFR: 119.68 mL/min (ref >60.00)
Glucose, Bld: 83 mg/dL (ref 70–99)
Potassium: 2.7 mEq/L — CL (ref 3.5–5.1)
Sodium: 140 mEq/L (ref 135–145)

## 2021-10-01 LAB — MAGNESIUM: Magnesium: 1.9 mg/dL (ref 1.5–2.5)

## 2021-10-01 MED ORDER — POTASSIUM CHLORIDE CRYS ER 20 MEQ PO TBCR: 40.0000 meq | EXTENDED_RELEASE_TABLET | Freq: Two times a day (BID) | ORAL | 0 refills | Status: DC

## 2021-10-01 MED ORDER — VALSARTAN 160 MG PO TABS: 160.0000 mg | ORAL_TABLET | Freq: Every day | ORAL | 2 refills | Status: DC

## 2021-10-01 NOTE — Progress Notes (Signed)
? ?Subjective:  ? ? Patient ID: [name], female    DOB: September 28, 1985, 36 y.o.   MRN: [id number] ? ?HPI ?Chief Complaint  ?Patient presents with  ? Numbness  ?  Tingling in both arms and legs since Monday. Tingling in legs comes and goes depending on how she is sitting or moving but arms stay consistently tingling.  Right hand was stuck for 4 hours on Monday and was painful which caused her to go to the ER.  ? Anxiety  ?  Would like to discuss possible anxiety medication  ? ?Complains of constant tingling in her arms and legs for the past 4 days. States her right hand got "stuck" 2 days ago.  ?3 month history of her arms and legs going to sleep or cramping.  ? ?Reports being symptom free today and at her baseline.  ? ?Recent ED visit showed potassium level 2.6.  ?States she left before she got her results.  ?CT head negative.  ?EKG ? ?States she bought OTC potassium and B12 and started them yesterday.  ? ?Denies fever, chills, dizziness, chest pain, palpitations, shortness of breath, abdominal pain, N/V/D, urinary symptoms, LE edema.  ? ?States she was having diarrhea, worse last week. Not as often this week.  ?She is taking a diuretic.  ?Hx of gastric by-pass  ? ?States had a panic attack while driving on the highway the other day and now feels like she can no longer drive on highway. States she has a history of anxiety. States she has been prescribed medications including Prozac and Trintellix but never started them. She is no in counseling currently.  ? ?Past Medical History:  ?Diagnosis Date  ? Anemia   ? Blood transfusion without reported diagnosis 2005  ? Morbid obesity ([place])   ? Sleep apnea   ? hx of  ? ? ?Reviewed allergies, medications, past medical, surgical, family, and social history. ? ? ? ? ?Review of Systems ?Pertinent positives and negatives in the history of present illness. ? ?   ?Objective:  ? Physical Exam ?BP (!) 144/88 (BP Location: Left Arm, Patient Position: Sitting, Cuff Size:  Large)   Pulse 82   Temp 98.7 ?F (37.1 ?C) (Temporal)   Ht '5\' 9"'$  (1.753 m)   Wt (!) 376 lb (170.6 kg)   LMP 09/25/2021   SpO2 96%   BMI 55.53 kg/m?  ? ?Alert and in no distress. Neck is supple with normal ROM.  Cardiac exam shows a regular rate and rhythm.  Lungs are clear to auscultation. Skin is warm and dry. PERRLA, EOMs intact. CNs intact. No tremor or weakness. Normal DTRs. Normal speech, memory and mood. Normal gait.  ? ?   ?Assessment & Plan:  ?Hypokalemia - Plan: Basic metabolic panel, Magnesium, potassium chloride SA (KLOR-CON M) 20 MEQ tablet, Magnesium, Basic metabolic panel ?-discussed case with patient's PCP. Stop diuretic and start on Valsartan.  ?Diarrhea has mainly resolved. No longer losing fluid. She will start on oral potassium replacement 40 MEQ bid for the next week and follow up here. Advised that if she develops any palpitations or worrisome symptoms that she will call 911 or go to the ED.  ? ?Primary hypertension - Plan: valsartan (DIOVAN) 160 MG tablet ?Stop diuretic and start on Valsartan. Keep an eye on BP at home.  ? ?Diarrhea, unspecified type ?-improved ? ?Tingling of both upper extremities - Plan: Basic metabolic panel, Magnesium, Magnesium, Basic metabolic panel ?-resolved. Recheck K and Mg. Replace potassium.  ? ?  Numbness and tingling of both lower extremities - Plan: Basic metabolic panel, Magnesium, Magnesium, Basic metabolic panel ?-resolved. Recheck K and Mg. Replace potassium.  ? ?Muscle cramping - Plan: Basic metabolic panel, Magnesium, Magnesium, Basic metabolic panel ?-resolved. Check labs and replace potassium ? ?Anxiety ?-she has been prescribed medication for anxiety in the past and has not started on it. She is interested in starting on medication. She will follow up with her PCP ? ?Panic attack ?-encourage her to ask someone to ride with her on the highway and attempt to drive without having panic attack. Advised to try distraction techniques. Recommend  counseling. Follow up here with PCP ? ? ?

## 2021-10-01 NOTE — Telephone Encounter (Signed)
CRITICAL VALUE STICKER ? ?CRITICAL VALUE: potassium 2.7 ? ?RECEIVER (on-site recipient of call): Jarrett Soho ? ?DATE & TIME NOTIFIED: 10/01/21 at 10:47 am ? ?MESSENGER (representative from lab): Saa ? ?MD NOTIFIED: Harland Dingwall ? ?TIME OF NOTIFICATION: 10:48 am ? ?

## 2021-10-01 NOTE — Patient Instructions (Addendum)
Got to the lab on the first floor for blood work.  ? ?STOP taking triamterene-HCTZ and start taking Valsartan for your blood pressure.  ? ?Take the potassium supplements twice daily (prescription) ? ?Follow up next week with Dr. Ronnald Ramp for recheck of labs and other concerns.  ?

## 2021-10-07 ENCOUNTER — Encounter: Payer: Self-pay | Admitting: Family Medicine

## 2021-10-07 ENCOUNTER — Ambulatory Visit: Payer: BC Managed Care – PPO | Admitting: Family Medicine

## 2021-10-07 VITALS — BP 158/106 | HR 93 | Temp 97.7°F | Ht 69.0 in | Wt 372.0 lb

## 2021-10-07 DIAGNOSIS — E876 Hypokalemia: Secondary | ICD-10-CM | POA: Diagnosis not present

## 2021-10-07 DIAGNOSIS — E538 Deficiency of other specified B group vitamins: Secondary | ICD-10-CM | POA: Diagnosis not present

## 2021-10-07 DIAGNOSIS — I10 Essential (primary) hypertension: Secondary | ICD-10-CM

## 2021-10-07 LAB — COMPREHENSIVE METABOLIC PANEL
ALT: 26 U/L (ref 0–35)
AST: 54 U/L — ABNORMAL HIGH (ref 0–37)
Albumin: 3.9 g/dL (ref 3.5–5.2)
Alkaline Phosphatase: 122 U/L — ABNORMAL HIGH (ref 39–117)
BUN: 5 mg/dL — ABNORMAL LOW (ref 6–23)
CO2: 25 mEq/L (ref 19–32)
Calcium: 7.8 mg/dL — ABNORMAL LOW (ref 8.4–10.5)
Chloride: 104 mEq/L (ref 96–112)
Creatinine, Ser: 0.54 mg/dL (ref 0.40–1.20)
GFR: 118.59 mL/min (ref >60.00)
Glucose, Bld: 76 mg/dL (ref 70–99)
Potassium: 3.8 mEq/L (ref 3.5–5.1)
Sodium: 139 mEq/L (ref 135–145)
Total Bilirubin: 0.6 mg/dL (ref 0.2–1.2)
Total Protein: 6.6 g/dL (ref 6.0–8.3)

## 2021-10-07 LAB — VITAMIN D 25 HYDROXY (VIT D DEFICIENCY, FRACTURES): VITD: <7 ng/mL — ABNORMAL LOW (ref 30.00–100.00)

## 2021-10-07 LAB — VITAMIN B12: Vitamin B-12: 118 pg/mL — ABNORMAL LOW (ref 211–911)

## 2021-10-07 MED ORDER — NEBIVOLOL HCL 10 MG PO TABS: 10.0000 mg | ORAL_TABLET | Freq: Every day | ORAL | 0 refills | Status: DC

## 2021-10-07 NOTE — Progress Notes (Signed)
? ?  Subjective:  ? ? Patient ID: [name], female    DOB: [birth year], 36 y.o.   MRN: [id number] ? ?HPI ?Chief Complaint  ?Patient presents with  ? Follow-up  ?  hypokalemia  ? ?She is here to follow up on hypokalemia. K was 2.6 in the ED on 09/29/2021. Recheck here on 10/01/2021 showed K 2.7 and normal magnesium.  ? ?She has been taking potassium 40 MEQ bid x 1 week.  ? ?HTN- States she has been only taking Valsartan 160 mg since last week.  ?We stopped her Triamterene- HCTZ on 10/01/2021. She also reportedly stopped taking Bystolic.  ? ?Hypocalcemia- Ca 7.2 on 10/01/2021 ?Diet is fairly rick in calcium per patient.  ? ?Hx of multiple vitamin deficiencies.  ?She is not taking a MVI but she has in the past.  ? ?Denies paresthesias, weakness, cramping today.  ? ?Denies fever, chills, dizziness, chest pain, palpitations, shortness of breath, abdominal pain, N/V/D, urinary symptoms, LE edema.  ? ?Reviewed allergies, medications, past medical, surgical, family, and social history. ? ? ? ?Review of Systems ?Pertinent positives and negatives in the history of present illness. ? ?   ?Objective:  ? Physical Exam ?BP (!) 158/106 (BP Location: Left Arm, Patient Position: Sitting, Cuff Size: Large)   Pulse 93   Temp 97.7 ?F (36.5 ?C) (Temporal)   Ht '5\' 9"'$  (1.753 m)   Wt (!) 372 lb (168.7 kg)   LMP 09/25/2021   SpO2 95%   BMI 54.93 kg/m?  ? ?Alert and oriented and in no acute distress.  Respirations unlabored.  Normal speech, mood and behavior. ? ? ?   ?Assessment & Plan:  ?Hypokalemia - Plan: Comprehensive metabolic panel, Comprehensive metabolic panel ? ?Primary hypertension - Plan: Comprehensive metabolic panel, nebivolol (BYSTOLIC) 10 MG tablet, Comprehensive metabolic panel ? ?B12 deficiency - Plan: Vitamin B12, Vitamin B12 ? ?Hypocalcemia - Plan: Comprehensive metabolic panel, VITAMIN D 25 Hydroxy (Vit-D Deficiency, Fractures), PTH, intact and calcium, PTH, intact and calcium, VITAMIN D 25 Hydroxy (Vit-D  Deficiency, Fractures), Comprehensive metabolic panel ? ?She feels at her baseline today.  Recheck potassium level as well as other labs. ?Blood pressure significantly elevated today and most likely due to recent change in medication.  She has also not been taking Bystolic which she will start back on today.  Continue valsartan 160 mg.  She has an upcoming appointment with her PCP, Dr. Ronnald Ramp.  Encouraged her to keep a close eye on her blood pressure at home and bring in readings to her visit. ?I will look for explanation for hypocalcemia including checking PTH.  History of vitamin D and B12 deficiencies.  Encouraged her to start back on a multivitamin.  Follow-up pending labs ? ?

## 2021-10-07 NOTE — Patient Instructions (Signed)
Start back on Bystolic and continue Valsartan.  ? ?Keep an eye on your blood pressure at home.  ? ?We will be in touch with your lab results.  ? ?Follow up with Dr. Ronnald Ramp later this month. Bring in your blood pressure readings from home and monitor.  ?

## 2021-10-08 ENCOUNTER — Other Ambulatory Visit: Payer: Self-pay | Admitting: Family Medicine

## 2021-10-08 DIAGNOSIS — E559 Vitamin D deficiency, unspecified: Secondary | ICD-10-CM

## 2021-10-08 DIAGNOSIS — E538 Deficiency of other specified B group vitamins: Secondary | ICD-10-CM

## 2021-10-08 MED ORDER — VITAMIN D (ERGOCALCIFEROL) 1.25 MG (50000 UNIT) PO CAPS: 50000.0000 [IU] | ORAL_CAPSULE | ORAL | 0 refills | Status: DC

## 2021-10-08 NOTE — Progress Notes (Signed)
Please see my note to patient. I recommend she come in to start getting B12 injections. Please schedule her to do this. She will need daily injections x 1 week and then weekly injections x 1 month and then monthly x 3 months. She may then be able to switch to oral B12 at that point.  ?I recommend she start taking a One A Day multivitamin as well.

## 2021-10-08 NOTE — Progress Notes (Signed)
Correction on B12 injections. Let's do bi-weekly injections x 2 months and then recheck her B12 level.

## 2021-10-09 LAB — PTH, INTACT AND CALCIUM
Calcium: 8 mg/dL — ABNORMAL LOW (ref 8.6–10.2)
PTH: 440 pg/mL — ABNORMAL HIGH (ref 16–77)

## 2021-10-17 ENCOUNTER — Ambulatory Visit: Payer: BC Managed Care – PPO

## 2021-10-23 ENCOUNTER — Encounter (HOSPITAL_COMMUNITY): Payer: Self-pay

## 2021-10-23 ENCOUNTER — Ambulatory Visit (HOSPITAL_COMMUNITY)
Admission: EM | Admit: 2021-10-23 | Discharge: 2021-10-23 | Disposition: A | Discharge status: Home / Self Care | Payer: BC Managed Care – PPO | Attending: Family Medicine | Admitting: Family Medicine

## 2021-10-23 DIAGNOSIS — L600 Ingrowing nail: Secondary | ICD-10-CM | POA: Diagnosis not present

## 2021-10-23 MED ORDER — MUPIROCIN 2 % EX OINT: 1.0000 "application " | TOPICAL_OINTMENT | Freq: Two times a day (BID) | CUTANEOUS | 0 refills | Status: DC

## 2021-10-23 MED ORDER — IBUPROFEN 800 MG PO TABS: 800.0000 mg | ORAL_TABLET | Freq: Three times a day (TID) | ORAL | 0 refills | Status: DC | PRN

## 2021-10-23 MED ORDER — CEPHALEXIN 250 MG PO CAPS: 250.0000 mg | ORAL_CAPSULE | Freq: Three times a day (TID) | ORAL | 0 refills | Status: AC

## 2021-10-23 NOTE — Discharge Instructions (Signed)
Take ibuprofen 800 mg--1 tab every 8 hours as needed for pain. ? ?Take cephalexin 250 mg--1 capsule 3 times daily for 7 days ? ?Apply mupirocin ointment to the affected area twice daily till improved ?

## 2021-10-23 NOTE — ED Triage Notes (Signed)
Pt presents with infection in left great toe X 1 week. ?

## 2021-10-23 NOTE — ED Provider Notes (Signed)
?[facility] ? ? ? ?CSN: [id number] ?Arrival date & time: 10/23/21  1254 ? ? ?  ? ?History   ?Chief Complaint ?Chief Complaint  ?Patient presents with  ? Nail Problem  ? ? ?HPI ?[name] is a 36 y.o. female.  ? ?HPI ?Here for swelling and pain in her left great toe near her toenail, began about 6 days ago after she had pulled a hangnail off that area.  ? ?Past Medical History:  ?Diagnosis Date  ? Anemia   ? Blood transfusion without reported diagnosis 2005  ? Morbid obesity ([place])   ? Sleep apnea   ? hx of  ? ? ?Patient Active Problem List  ? Diagnosis Date Noted  ? Hypokalemia 10/01/2021  ? Muscle cramping 10/01/2021  ? Anxiety 10/01/2021  ? Panic attack 10/01/2021  ? Numbness and tingling of both lower extremities 10/01/2021  ? Tingling of both upper extremities 10/01/2021  ? Asthmatic bronchitis , chronic ([place]) 01/09/2021  ? Current severe episode of major depressive disorder without psychotic features without prior episode ([place]) 10/17/2020  ? Psychophysiological insomnia 10/17/2020  ? Dietary folate deficiency anemia 04/11/2020  ? Primary hypertension 04/10/2020  ? Obstructive sleep apnea syndrome 04/10/2020  ? PMS (premenstrual syndrome) 03/17/2018  ? Thiamine deficiency 08/02/2017  ? Primary insomnia 07/28/2017  ? Morbid obesity with BMI of 50.0-59.9, adult ([place]) 07/28/2017  ? Chronic zinc deficiency 06/25/2015  ? B12 deficiency 06/20/2015  ? Hyperglycemia 06/19/2015  ? Vitamin D deficiency 06/20/2013  ? 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 Routine general medical examination at a health care facility 12/04/2011  ? Iron deficiency anemia 12/04/2011  ? Severe obesity (BMI >= 40) ([place]) 12/04/2011  ? ? ?Past Surgical History:  ?Procedure Laterality Date  ? BREATH TEK H PYLORI  02/09/2012  ? Procedure: BREATH TEK H PYLORI;  Surgeon: Pedro Earls, MD;  Location: [facility];  Service: General;  Laterality: N/A;  ? GASTRIC BYPASS  08/29/2012  ? HERNIA REPAIR  2014  ? ? ?OB History    ? ? Gravida  ?2  ? Para  ?1  ? Term  ?1  ? Preterm  ?   ? AB  ?1  ? Living  ?1  ?  ? ? SAB  ?1  ? IAB  ?   ? Ectopic  ?   ? Multiple  ?   ? Live Births  ?1  ?   ?  ?  ? ? ? ?Home Medications   ? ?Prior to Admission medications   ?Medication Sig Start Date End Date Taking? Authorizing Provider  ?cephALEXin (KEFLEX) 250 MG capsule Take 1 capsule (250 mg total) by mouth 3 (three) times daily for 7 days. 10/23/21 10/30/21 Yes Nena Hampe, Gwenlyn Perking, MD  ?ibuprofen (ADVIL) 800 MG tablet Take 1 tablet (800 mg total) by mouth every 8 (eight) hours as needed (pain). 10/23/21  Yes Barrett Henle, MD  ?mupirocin ointment (BACTROBAN) 2 % Apply 1 application. topically 2 (two) times daily. To affected area till better 10/23/21  Yes Minola Guin, Gwenlyn Perking, MD  ?levalbuterol [facility] HFA) 45 MCG/ACT inhaler Inhale 2 puffs into the lungs every 6 (six) hours as needed for wheezing. 01/09/21   Janith Lima, MD  ?nebivolol (BYSTOLIC) 10 MG tablet Take 1 tablet (10 mg total) by mouth daily. 10/07/21   Henson, Vickie L, NP-C  ?potassium chloride SA (KLOR-CON M) 20 MEQ tablet Take 2 tablets (40 mEq total) by mouth 2 (two) times daily.  10/01/21   Henson, Vickie L, NP-C  ?valsartan (DIOVAN) 160 MG tablet Take 1 tablet (160 mg total) by mouth daily. 10/01/21   Girtha Rm, NP-C  ?Vitamin D, Ergocalciferol, (DRISDOL) 1.25 MG (50000 UNIT) CAPS capsule Take 1 capsule (50,000 Units total) by mouth every 7 (seven) days. 10/08/21   Girtha Rm, NP-C  ?FLUoxetine (PROZAC) 10 MG tablet Take 1 tablet (10 mg total) by mouth daily. ?Patient not taking: Reported on 02/01/2019 03/17/18 02/01/19  Janith Lima, MD  ?phentermine 37.5 MG capsule Take 1 capsule (37.5 mg total) by mouth every morning. ?Patient not taking: Reported on 02/01/2019 10/20/18 02/01/19  Janith Lima, MD  ? ? ?Family History ?Family History  ?Problem Relation Age of Onset  ? Hypertension Father   ? Hypertension Mother   ? Alcohol abuse Neg Hx   ? Arthritis Neg Hx   ? Cancer Neg Hx    ? Early death Neg Hx   ? Heart disease Neg Hx   ? Hyperlipidemia Neg Hx   ? Kidney disease Neg Hx   ? Stroke Neg Hx   ? ? ?Social History ?Social History  ? ?Tobacco Use  ? Smoking status: Never  ? Smokeless tobacco: Never  ?Substance Use Topics  ? Alcohol use: No  ? Drug use: No  ? ? ? ?Allergies   ?Patient has no known allergies. ? ? ?Review of Systems ?Review of Systems ? ? ?Physical Exam ?Triage Vital Signs ?ED Triage Vitals  ?Enc Vitals Group  ?   BP 10/23/21 1345 (!) 165/114  ?   Pulse Rate 10/23/21 1345 96  ?   Resp 10/23/21 1345 18  ?   Temp 10/23/21 1345 99.7 ?F (37.6 ?C)  ?   Temp Source 10/23/21 1345 Oral  ?   SpO2 10/23/21 1345 99 %  ?   Weight --   ?   Height --   ?   Head Circumference --   ?   Peak Flow --   ?   Pain Score 10/23/21 1347 4  ?   Pain Loc --   ?   Pain Edu? --   ?   Excl. in [place]? --   ? ?No data found. ? ?Updated Vital Signs ?BP (!) 165/114 (BP Location: Left Arm)   Pulse 96   Temp 99.7 ?F (37.6 ?C) (Oral)   Resp 18   LMP 09/25/2021   SpO2 99%  ? ?Visual Acuity ?Right Eye Distance:   ?Left Eye Distance:   ?Bilateral Distance:   ? ?Right Eye Near:   ?Left Eye Near:    ?Bilateral Near:    ? ?Physical Exam ?Vitals reviewed.  ?Constitutional:   ?   General: She is not in acute distress. ?   Appearance: She is not toxic-appearing.  ?Skin: ?   Comments: There is some edema on the medial nail fold of the left great toe.  It is also little erythematous there.  No discharge  ?Neurological:  ?   Mental Status: She is oriented to person, place, and time.  ?Psychiatric:     ?   Behavior: Behavior normal.  ? ? ? ?UC Treatments / Results  ?Labs ?(all labs ordered are listed, but only abnormal results are displayed) ?Labs Reviewed - No data to display ? ?EKG ? ? ?Radiology ?No results found. ? ?Procedures ?Procedures (including critical care time) ? ?Medications Ordered in UC ?Medications - No data to display ? ?Initial Impression / Assessment and Plan /  UC Course  ?I have reviewed the triage  vital signs and the nursing notes. ? ?Pertinent labs & imaging results that were available during my care of the patient were reviewed by me and considered in my medical decision making (see chart for details). ? ?  ? ?Will treat with keflex and give her contact info for [facility]. ?Final Clinical Impressions(s) / UC Diagnoses  ? ?Final diagnoses:  ?Ingrown left big toenail  ? ? ? ?Discharge Instructions   ? ?  ?Take ibuprofen 800 mg--1 tab every 8 hours as needed for pain. ? ?Take cephalexin 250 mg--1 capsule 3 times daily for 7 days ? ?Apply mupirocin ointment to the affected area twice daily till improved ? ? ? ? ?ED Prescriptions   ? ? Medication Sig Dispense Auth. Provider  ? cephALEXin (KEFLEX) 250 MG capsule Take 1 capsule (250 mg total) by mouth 3 (three) times daily for 7 days. 21 capsule Barrett Henle, MD  ? ibuprofen (ADVIL) 800 MG tablet Take 1 tablet (800 mg total) by mouth every 8 (eight) hours as needed (pain). 21 tablet Barrett Henle, MD  ? mupirocin ointment (BACTROBAN) 2 % Apply 1 application. topically 2 (two) times daily. To affected area till better 22 g Windy Carina Gwenlyn Perking, MD  ? ?  ? ?PDMP not reviewed this encounter. ?  ?Barrett Henle, MD ?10/23/21 1425 ? ?

## 2021-10-27 ENCOUNTER — Ambulatory Visit: Payer: BC Managed Care – PPO | Admitting: Internal Medicine

## 2021-10-27 ENCOUNTER — Encounter: Payer: Self-pay | Admitting: Internal Medicine

## 2021-10-27 VITALS — BP 176/98 | HR 81 | Temp 98.4°F | Ht 69.0 in | Wt 376.0 lb

## 2021-10-27 DIAGNOSIS — F411 Generalized anxiety disorder: Secondary | ICD-10-CM | POA: Insufficient documentation

## 2021-10-27 DIAGNOSIS — I10 Essential (primary) hypertension: Secondary | ICD-10-CM | POA: Diagnosis not present

## 2021-10-27 DIAGNOSIS — H18622 Keratoconus, unstable, left eye: Secondary | ICD-10-CM | POA: Diagnosis not present

## 2021-10-27 DIAGNOSIS — E538 Deficiency of other specified B group vitamins: Secondary | ICD-10-CM | POA: Diagnosis not present

## 2021-10-27 HISTORY — DX: Generalized anxiety disorder: F41.1

## 2021-10-27 MED ORDER — CYANOCOBALAMIN 1000 MCG/ML IJ SOLN
1000.0000 ug | Freq: Once | INTRAMUSCULAR | Status: AC
  Administered 2021-10-27: 1000 ug via INTRAMUSCULAR

## 2021-10-27 MED ORDER — TRIAMTERENE-HCTZ 37.5-25 MG PO CAPS: 1.0000 | ORAL_CAPSULE | Freq: Every day | ORAL | 0 refills | Status: DC

## 2021-10-27 MED ORDER — ESCITALOPRAM OXALATE 5 MG PO TABS: 5.0000 mg | ORAL_TABLET | Freq: Every day | ORAL | 0 refills | Status: DC

## 2021-10-27 NOTE — Progress Notes (Signed)
? ?Subjective:  ?Patient ID: [name], female    DOB: 21-Jun-1986  Age: 36 y.o. MRN: [id number] ? ?CC: Hypertension ? ? ?HPI ?Virgel Gess Muhlenkamp presents for f/up - ? ?She is no longer taking a diuretic. She is compliant with the BB and ARB. She complains of a 3 month history and anxiety and panic. ? ?Outpatient Medications Prior to Visit  ?Medication Sig Dispense Refill  ? cephALEXin (KEFLEX) 250 MG capsule Take 1 capsule (250 mg total) by mouth 3 (three) times daily for 7 days. 21 capsule 0  ? levalbuterol (XOPENEX HFA) 45 MCG/ACT inhaler Inhale 2 puffs into the lungs every 6 (six) hours as needed for wheezing. 15 g 3  ? mupirocin ointment (BACTROBAN) 2 % Apply 1 application. topically 2 (two) times daily. To affected area till better 22 g 0  ? nebivolol (BYSTOLIC) 10 MG tablet Take 1 tablet (10 mg total) by mouth daily. 90 tablet 0  ? potassium chloride SA (KLOR-CON M) 20 MEQ tablet Take 2 tablets (40 mEq total) by mouth 2 (two) times daily. 30 tablet 0  ? valsartan (DIOVAN) 160 MG tablet Take 1 tablet (160 mg total) by mouth daily. 30 tablet 2  ? Vitamin D, Ergocalciferol, (DRISDOL) 1.25 MG (50000 UNIT) CAPS capsule Take 1 capsule (50,000 Units total) by mouth every 7 (seven) days. 12 capsule 0  ? ibuprofen (ADVIL) 800 MG tablet Take 1 tablet (800 mg total) by mouth every 8 (eight) hours as needed (pain). 21 tablet 0  ? ?No facility-administered medications prior to visit.  ? ? ?ROS ?Review of Systems  ?Constitutional: Negative.  Negative for diaphoresis and fatigue.  ?HENT: Negative.    ?Eyes: Negative.  Negative for visual disturbance.  ?Respiratory:  Negative for cough, chest tightness, shortness of breath and wheezing.   ?Cardiovascular:  Negative for chest pain, palpitations and leg swelling.  ?Gastrointestinal: Negative.  Negative for abdominal pain, nausea and vomiting.  ?Endocrine: Negative.   ?Genitourinary: Negative.  Negative for difficulty urinating.  ?Musculoskeletal: Negative.   ?Skin:  Negative.   ?Neurological: Negative.  Negative for dizziness, weakness, light-headedness, numbness and headaches.  ?Hematological:  Negative for adenopathy. Does not bruise/bleed easily.  ?Psychiatric/Behavioral:  Positive for dysphoric mood. Negative for decreased concentration, sleep disturbance and suicidal ideas. The patient is nervous/anxious. The patient is not hyperactive.   ? ?Objective:  ?BP (!) 176/98 (BP Location: Right Arm, Patient Position: Sitting, Cuff Size: Large)   Pulse 81   Temp 98.4 ?F (36.9 ?C) (Oral)   Ht '5\' 9"'$  (1.753 m)   Wt (!) 376 lb (170.6 kg)   LMP 09/30/2021 (Approximate)   SpO2 98%   BMI 55.53 kg/m?  ? ?BP Readings from Last 3 Encounters:  ?10/27/21 (!) 176/98  ?10/23/21 (!) 165/114  ?10/07/21 (!) 158/106  ? ? ?Wt Readings from Last 3 Encounters:  ?10/27/21 (!) 376 lb (170.6 kg)  ?10/07/21 (!) 372 lb (168.7 kg)  ?10/01/21 (!) 376 lb (170.6 kg)  ? ? ?Physical Exam ?Vitals reviewed.  ?Constitutional:   ?   Appearance: She is obese. She is not ill-appearing.  ?HENT:  ?   Nose: Nose normal.  ?   Mouth/Throat:  ?   Mouth: Mucous membranes are moist.  ?Eyes:  ?   General: No scleral icterus. ?   Conjunctiva/sclera: Conjunctivae normal.  ?Cardiovascular:  ?   Rate and Rhythm: Normal rate and regular rhythm.  ?   Heart sounds: No murmur heard. ?Pulmonary:  ?   Effort: Pulmonary  effort is normal.  ?   Breath sounds: No stridor. No wheezing, rhonchi or rales.  ?Abdominal:  ?   General: Abdomen is protuberant. Bowel sounds are normal. There is no distension.  ?   Palpations: Abdomen is soft. There is no hepatomegaly, splenomegaly or mass.  ?   Tenderness: There is no abdominal tenderness.  ?Musculoskeletal:  ?   Cervical back: Neck supple.  ?   Right lower leg: No edema.  ?   Left lower leg: No edema.  ?Lymphadenopathy:  ?   Cervical: No cervical adenopathy.  ?Skin: ?   General: Skin is warm.  ?Neurological:  ?   General: No focal deficit present.  ?   Mental Status: She is alert. Mental  status is at baseline.  ?Psychiatric:     ?   Attention and Perception: Attention and perception normal.     ?   Mood and Affect: Mood is anxious and depressed. Affect is flat.     ?   Speech: Speech normal. She is communicative. Speech is not rapid and pressured, delayed or tangential.     ?   Behavior: Behavior normal. Behavior is not agitated, slowed, aggressive, withdrawn or hyperactive. Behavior is cooperative.     ?   Cognition and Memory: Cognition normal.  ? ? ?Lab Results  ?Component Value Date  ? WBC 6.1 09/29/2021  ? HGB 12.2 09/29/2021  ? HCT 38.6 09/29/2021  ? PLT 155 09/29/2021  ? GLUCOSE 76 10/07/2021  ? CHOL 188 10/17/2020  ? TRIG 208.0 (H) 10/17/2020  ? HDL 92.80 10/17/2020  ? LDLDIRECT 87.0 10/17/2020  ? [place] 64 07/28/2017  ? ALT 26 10/07/2021  ? AST 54 (H) 10/07/2021  ? NA 139 10/07/2021  ? K 3.8 10/07/2021  ? CL 104 10/07/2021  ? CREATININE 0.54 10/07/2021  ? BUN 5 (L) 10/07/2021  ? CO2 25 10/07/2021  ? TSH 1.00 10/17/2020  ? HGBA1C 4.9 10/17/2020  ? ? ?No results found. ? ?Assessment & Plan:  ? ?Denzil was seen today for hypertension. ? ?Diagnoses and all orders for this visit: ? ?Primary hypertension- Her BP remains too high. Will start a diuretic. ?-     triamterene-hydrochlorothiazide (DYAZIDE) 37.5-25 MG capsule; Take 1 each (1 capsule total) by mouth daily. ? ?B12 deficiency ?-     cyanocobalamin ((VITAMIN B-12)) injection 1,000 mcg ? ?GAD (generalized anxiety disorder) ?-     escitalopram (LEXAPRO) 5 MG tablet; Take 1 tablet (5 mg total) by mouth daily. ? ? ?I have discontinued Lajean R. Macy's ibuprofen. I am also having her start on triamterene-hydrochlorothiazide and escitalopram. Additionally, I am having her maintain her levalbuterol, valsartan, potassium chloride SA, nebivolol, Vitamin D (Ergocalciferol), cephALEXin, and mupirocin ointment. We administered cyanocobalamin. ? ?Meds ordered this encounter  ?Medications  ? triamterene-hydrochlorothiazide (DYAZIDE) 37.5-25 MG  capsule  ?  Sig: Take 1 each (1 capsule total) by mouth daily.  ?  Dispense:  90 capsule  ?  Refill:  0  ? cyanocobalamin ((VITAMIN B-12)) injection 1,000 mcg  ? escitalopram (LEXAPRO) 5 MG tablet  ?  Sig: Take 1 tablet (5 mg total) by mouth daily.  ?  Dispense:  30 tablet  ?  Refill:  0  ? ? ? ?Follow-up: Return in about 6 weeks (around 12/08/2021). ? ?Scarlette Calico, MD ?

## 2021-10-27 NOTE — Patient Instructions (Signed)
Hypertension, Adult High blood pressure (hypertension) is when the force of blood pumping through the arteries is too strong. The arteries are the blood vessels that carry blood from the heart throughout the body. Hypertension forces the heart to work harder to pump blood and may cause arteries to become narrow or stiff. Untreated or uncontrolled hypertension can lead to a heart attack, heart failure, a stroke, kidney disease, and other problems. A blood pressure reading consists of a higher number over a lower number. Ideally, your blood pressure should be below 120/80. The first ("top") number is called the systolic pressure. It is a measure of the pressure in your arteries as your heart beats. The second ("bottom") number is called the diastolic pressure. It is a measure of the pressure in your arteries as the heart relaxes. What are the causes? The exact cause of this condition is not known. There are some conditions that result in high blood pressure. What increases the risk? Certain factors may make you more likely to develop high blood pressure. Some of these risk factors are under your control, including: Smoking. Not getting enough exercise or physical activity. Being overweight. Having too much fat, sugar, calories, or salt (sodium) in your diet. Drinking too much alcohol. Other risk factors include: Having a personal history of heart disease, diabetes, high cholesterol, or kidney disease. Stress. Having a family history of high blood pressure and high cholesterol. Having obstructive sleep apnea. Age. The risk increases with age. What are the signs or symptoms? High blood pressure may not cause symptoms. Very high blood pressure (hypertensive crisis) may cause: Headache. Fast or irregular heartbeats (palpitations). Shortness of breath. Nosebleed. Nausea and vomiting. Vision changes. Severe chest pain, dizziness, and seizures. How is this diagnosed? This condition is diagnosed by  measuring your blood pressure while you are seated, with your arm resting on a flat surface, your legs uncrossed, and your feet flat on the floor. The cuff of the blood pressure monitor will be placed directly against the skin of your upper arm at the level of your heart. Blood pressure should be measured at least twice using the same arm. Certain conditions can cause a difference in blood pressure between your right and left arms. If you have a high blood pressure reading during one visit or you have normal blood pressure with other risk factors, you may be asked to: Return on a different day to have your blood pressure checked again. Monitor your blood pressure at home for 1 week or longer. If you are diagnosed with hypertension, you may have other blood or imaging tests to help your health care provider understand your overall risk for other conditions. How is this treated? This condition is treated by making healthy lifestyle changes, such as eating healthy foods, exercising more, and reducing your alcohol intake. You may be referred for counseling on a healthy diet and physical activity. Your health care provider may prescribe medicine if lifestyle changes are not enough to get your blood pressure under control and if: Your systolic blood pressure is above 130. Your diastolic blood pressure is above 80. Your personal target blood pressure may vary depending on your medical conditions, your age, and other factors. Follow these instructions at home: Eating and drinking  Eat a diet that is high in fiber and potassium, and low in sodium, added sugar, and fat. An example of this eating plan is called the DASH diet. DASH stands for Dietary Approaches to Stop Hypertension. To eat this way: Eat   plenty of fresh fruits and vegetables. Try to fill one half of your plate at each meal with fruits and vegetables. Eat whole grains, such as whole-wheat pasta, brown rice, or whole-grain bread. Fill about one  fourth of your plate with whole grains. Eat or drink low-fat dairy products, such as skim milk or low-fat yogurt. Avoid fatty cuts of meat, processed or cured meats, and poultry with skin. Fill about one fourth of your plate with lean proteins, such as fish, chicken without skin, beans, eggs, or tofu. Avoid pre-made and processed foods. These tend to be higher in sodium, added sugar, and fat. Reduce your daily sodium intake. Many people with hypertension should eat less than 1,500 mg of sodium a day. Do not drink alcohol if: Your health care provider tells you not to drink. You are pregnant, may be pregnant, or are planning to become pregnant. If you drink alcohol: Limit how much you have to: 0-1 drink a day for women. 0-2 drinks a day for men. Know how much alcohol is in your drink. In the [place] one drink equals one 12 oz bottle of beer (355 mL), one 5 oz glass of wine (148 mL), or one 1 oz glass of hard liquor (44 mL). Lifestyle  Work with your health care provider to maintain a healthy body weight or to lose weight. Ask what an ideal weight is for you. Get at least 30 minutes of exercise that causes your heart to beat faster (aerobic exercise) most days of the week. Activities may include walking, swimming, or biking. Include exercise to strengthen your muscles (resistance exercise), such as Pilates or lifting weights, as part of your weekly exercise routine. Try to do these types of exercises for 30 minutes at least 3 days a week. Do not use any products that contain nicotine or tobacco. These products include cigarettes, chewing tobacco, and vaping devices, such as e-cigarettes. If you need help quitting, ask your health care provider. Monitor your blood pressure at home as told by your health care provider. Keep all follow-up visits. This is important. Medicines Take over-the-counter and prescription medicines only as told by your health care provider. Follow directions carefully. Blood  pressure medicines must be taken as prescribed. Do not skip doses of blood pressure medicine. Doing this puts you at risk for problems and can make the medicine less effective. Ask your health care provider about side effects or reactions to medicines that you should watch for. Contact a health care provider if you: Think you are having a reaction to a medicine you are taking. Have headaches that keep coming back (recurring). Feel dizzy. Have swelling in your ankles. Have trouble with your vision. Get help right away if you: Develop a severe headache or confusion. Have unusual weakness or numbness. Feel faint. Have severe pain in your chest or abdomen. Vomit repeatedly. Have trouble breathing. These symptoms may be an emergency. Get help right away. Call 911. Do not wait to see if the symptoms will go away. Do not drive yourself to the hospital. Summary Hypertension is when the force of blood pumping through your arteries is too strong. If this condition is not controlled, it may put you at risk for serious complications. Your personal target blood pressure may vary depending on your medical conditions, your age, and other factors. For most people, a normal blood pressure is less than 120/80. Hypertension is treated with lifestyle changes, medicines, or a combination of both. Lifestyle changes include losing weight, eating a healthy,   low-sodium diet, exercising more, and limiting alcohol. This information is not intended to replace advice given to you by your health care provider. Make sure you discuss any questions you have with your health care provider. Document Revised: 04/29/2021 Document Reviewed: 04/29/2021 Elsevier Patient Education  2023 Elsevier Inc.  

## 2021-11-03 IMAGING — DX DG CHEST 1V
1 series · 1 of 1 positions shown · non-contrast
Comparison: 05/15/2012

CLINICAL DATA: N0Q01-MM positivity with difficulty breathing

EXAM:
CHEST  1 VIEW

[chest pa]
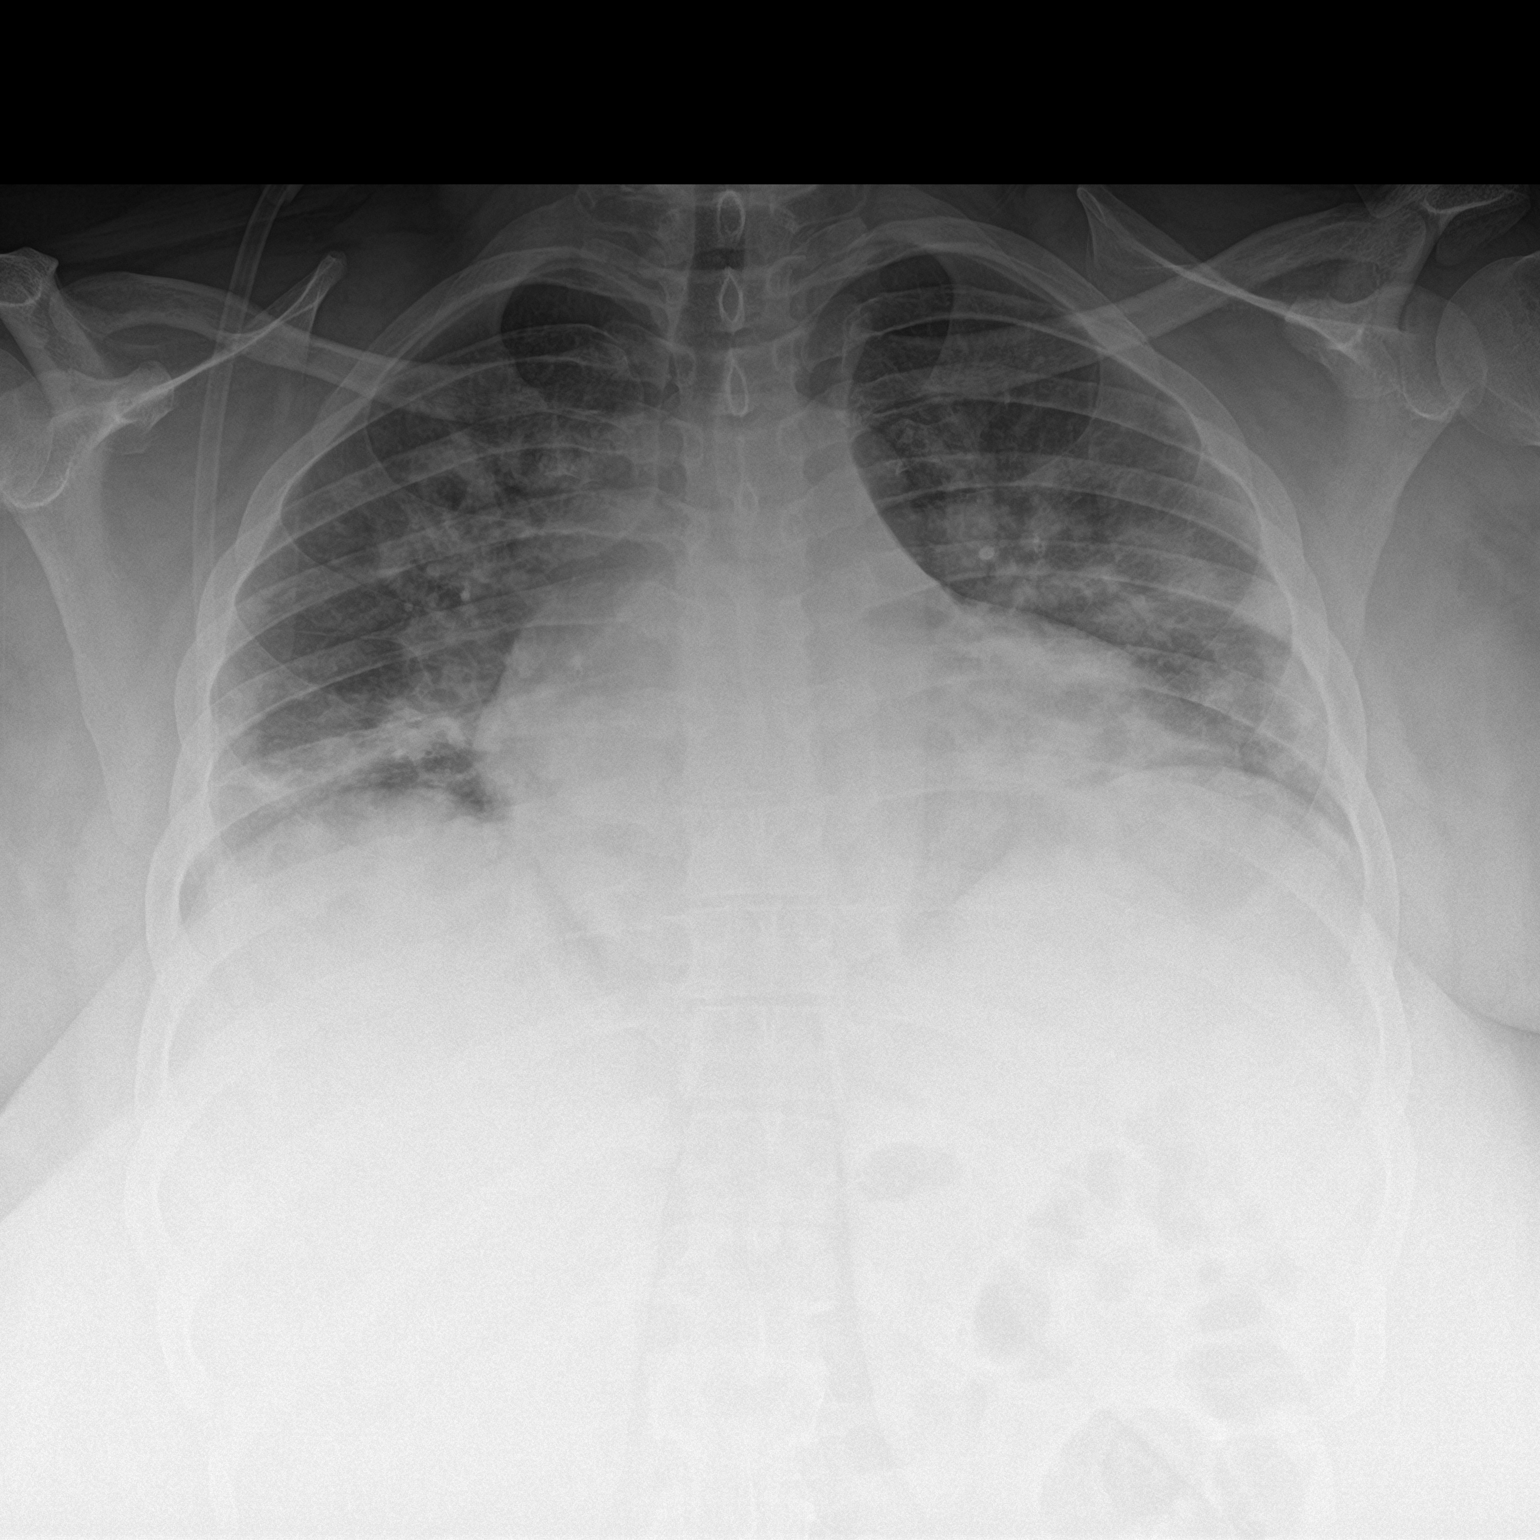

[1 of 1 positions shown; findings below may reference images not displayed]

FINDINGS: Cardiac shadow is enlarged but stable. Diffuse bilateral airspace
opacities are noted consistent with the given clinical history of
N0Q01-MM positivity. No bony abnormality is seen.
IMPRESSION: Bilateral airspace opacities consistent with the given clinical
history.

## 2021-11-18 ENCOUNTER — Other Ambulatory Visit: Payer: Self-pay | Admitting: Internal Medicine

## 2021-11-18 DIAGNOSIS — F411 Generalized anxiety disorder: Secondary | ICD-10-CM

## 2021-11-28 DIAGNOSIS — L281 Prurigo nodularis: Secondary | ICD-10-CM | POA: Diagnosis not present

## 2021-11-28 DIAGNOSIS — L218 Other seborrheic dermatitis: Secondary | ICD-10-CM | POA: Diagnosis not present

## 2021-11-28 DIAGNOSIS — L2089 Other atopic dermatitis: Secondary | ICD-10-CM | POA: Diagnosis not present

## 2021-11-28 DIAGNOSIS — L818 Other specified disorders of pigmentation: Secondary | ICD-10-CM | POA: Diagnosis not present

## 2021-12-02 ENCOUNTER — Ambulatory Visit (INDEPENDENT_AMBULATORY_CARE_PROVIDER_SITE_OTHER): Payer: BC Managed Care – PPO | Admitting: Podiatry

## 2021-12-02 DIAGNOSIS — L600 Ingrowing nail: Secondary | ICD-10-CM | POA: Diagnosis not present

## 2021-12-02 MED ORDER — GENTAMICIN SULFATE 0.1 % EX CREA: 1.0000 "application " | TOPICAL_CREAM | Freq: Two times a day (BID) | CUTANEOUS | 1 refills | Status: DC

## 2021-12-02 NOTE — Progress Notes (Signed)
   Subjective: Patient presents today for evaluation of pain to the medial border left great toe. Patient is concerned for possible ingrown nail.  It is very sensitive to touch.  Patient presents today for further treatment and evaluation.  Past Medical History:  Diagnosis Date   Anemia    Blood transfusion without reported diagnosis 2005   Morbid obesity ([facility])    Sleep apnea    hx of    Objective:  General: Well developed, nourished, in no acute distress, alert and oriented x3   Dermatology: Skin is warm, dry and supple bilateral.  Medial border left great toe appears to be erythematous with evidence of an ingrowing nail. Pain on palpation noted to the border of the nail fold. The remaining nails appear unremarkable at this time. There are no open sores, lesions.  Vascular: Dorsalis Pedis artery and Posterior Tibial artery pedal pulses palpable. No lower extremity edema noted.   Neruologic: Grossly intact via light touch bilateral.  Musculoskeletal: Muscular strength within normal limits in all groups bilateral. Normal range of motion noted to all pedal and ankle joints.   Assesement: #1 Paronychia with ingrowing nail medial border left great toe #2 Pain in toe  Plan of Care:  1. Patient evaluated.  2. Discussed treatment alternatives and plan of care. Explained nail avulsion procedure and post procedure course to patient. 3. Patient opted for permanent partial nail avulsion of the ingrown portion of the nail.  4. Prior to procedure, local anesthesia infiltration utilized using 3 ml of a 50:50 mixture of 2% plain lidocaine and 0.5% plain marcaine in a normal hallux block fashion and a betadine prep performed.  5. Partial permanent nail avulsion with chemical matrixectomy performed using [id number] applications of phenol followed by alcohol flush.  6. Light dressing applied.  Post care instructions provided 7.  Prescription for gentamicin 2% cream  8.  Return to clinic 2  weeks.  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  Son's name is Jacqulyn Liner (66yr old) Wearing [id number] today.   BEdrick Kins DPM [facility]  Dr. BEdrick Kins DPM    [address] [place] [id number]               Office [phone] Fax [phone]

## 2021-12-09 ENCOUNTER — Ambulatory Visit: Payer: BC Managed Care – PPO | Admitting: Internal Medicine

## 2021-12-23 ENCOUNTER — Ambulatory Visit: Payer: BC Managed Care – PPO | Admitting: Podiatry

## 2021-12-26 ENCOUNTER — Other Ambulatory Visit: Payer: Self-pay | Admitting: Family Medicine

## 2021-12-26 DIAGNOSIS — I10 Essential (primary) hypertension: Secondary | ICD-10-CM

## 2021-12-27 ENCOUNTER — Other Ambulatory Visit: Payer: Self-pay | Admitting: Family Medicine

## 2021-12-27 DIAGNOSIS — E559 Vitamin D deficiency, unspecified: Secondary | ICD-10-CM

## 2022-01-05 ENCOUNTER — Other Ambulatory Visit: Payer: Self-pay | Admitting: Family Medicine

## 2022-01-05 DIAGNOSIS — I10 Essential (primary) hypertension: Secondary | ICD-10-CM

## 2022-01-08 ENCOUNTER — Ambulatory Visit: Payer: BC Managed Care – PPO | Admitting: Internal Medicine

## 2022-01-17 ENCOUNTER — Other Ambulatory Visit: Payer: Self-pay | Admitting: Internal Medicine

## 2022-01-17 DIAGNOSIS — I10 Essential (primary) hypertension: Secondary | ICD-10-CM

## 2022-01-21 ENCOUNTER — Ambulatory Visit: Payer: BC Managed Care – PPO | Admitting: Internal Medicine

## 2022-01-26 ENCOUNTER — Telehealth: Payer: Self-pay

## 2022-01-26 ENCOUNTER — Other Ambulatory Visit: Payer: Self-pay | Admitting: Internal Medicine

## 2022-01-26 DIAGNOSIS — J449 Chronic obstructive pulmonary disease, unspecified: Secondary | ICD-10-CM

## 2022-01-26 MED ORDER — LEVALBUTEROL TARTRATE 45 MCG/ACT IN AERO: INHALATION_SPRAY | RESPIRATORY_TRACT | 3 refills | Status: DC

## 2022-01-26 NOTE — Telephone Encounter (Signed)
Rx has been sent to [facility] in [place].   Pt informed.

## 2022-01-26 NOTE — Telephone Encounter (Signed)
They don't have it in stock. Requesting it go to different pharmacy.

## 2022-01-26 NOTE — Telephone Encounter (Signed)
Pt was advised that [facility] would not have the Rx until Thursday and she is requesting medication be sent over to [place], [place].  Pt states she really needs the medication today.

## 2022-01-26 NOTE — Telephone Encounter (Signed)
My apologies levalbuterol [facility] HFA) 45 MCG/ACT inhaler

## 2022-02-04 ENCOUNTER — Ambulatory Visit: Payer: BC Managed Care – PPO | Admitting: Internal Medicine

## 2022-02-11 ENCOUNTER — Encounter: Payer: Self-pay | Admitting: Internal Medicine

## 2022-02-11 ENCOUNTER — Telehealth: Payer: Self-pay

## 2022-02-11 ENCOUNTER — Ambulatory Visit: Payer: BC Managed Care – PPO | Admitting: Internal Medicine

## 2022-02-11 VITALS — BP 136/88 | HR 101 | Temp 98.7°F | Ht 69.0 in | Wt 356.2 lb

## 2022-02-11 DIAGNOSIS — E519 Thiamine deficiency, unspecified: Secondary | ICD-10-CM | POA: Diagnosis not present

## 2022-02-11 DIAGNOSIS — E559 Vitamin D deficiency, unspecified: Secondary | ICD-10-CM | POA: Diagnosis not present

## 2022-02-11 DIAGNOSIS — Z136 Encounter for screening for cardiovascular disorders: Secondary | ICD-10-CM

## 2022-02-11 DIAGNOSIS — Z124 Encounter for screening for malignant neoplasm of cervix: Secondary | ICD-10-CM

## 2022-02-11 DIAGNOSIS — R7989 Other specified abnormal findings of blood chemistry: Secondary | ICD-10-CM | POA: Insufficient documentation

## 2022-02-11 DIAGNOSIS — E538 Deficiency of other specified B group vitamins: Secondary | ICD-10-CM | POA: Diagnosis not present

## 2022-02-11 DIAGNOSIS — D520 Dietary folate deficiency anemia: Secondary | ICD-10-CM

## 2022-02-11 DIAGNOSIS — F411 Generalized anxiety disorder: Secondary | ICD-10-CM

## 2022-02-11 DIAGNOSIS — E876 Hypokalemia: Secondary | ICD-10-CM

## 2022-02-11 DIAGNOSIS — I10 Essential (primary) hypertension: Secondary | ICD-10-CM | POA: Diagnosis not present

## 2022-02-11 DIAGNOSIS — Z0000 Encounter for general adult medical examination without abnormal findings: Secondary | ICD-10-CM

## 2022-02-11 DIAGNOSIS — E60 Dietary zinc deficiency: Secondary | ICD-10-CM

## 2022-02-11 LAB — CBC WITH DIFFERENTIAL/PLATELET
Basophils Absolute: 0 10*3/uL (ref 0.0–0.1)
Basophils Relative: 0.7 % (ref 0.0–3.0)
Eosinophils Absolute: 0.2 10*3/uL (ref 0.0–0.7)
Eosinophils Relative: 4 % (ref 0.0–5.0)
HCT: 38.6 % (ref 36.0–46.0)
Hemoglobin: 12.1 g/dL (ref 12.0–15.0)
Lymphocytes Relative: 17.1 % (ref 12.0–46.0)
Lymphs Abs: 0.9 10*3/uL (ref 0.7–4.0)
MCHC: 31.4 g/dL (ref 30.0–36.0)
MCV: 92.8 fl (ref 78.0–100.0)
Monocytes Absolute: 0.5 10*3/uL (ref 0.1–1.0)
Monocytes Relative: 9.4 % (ref 3.0–12.0)
Neutro Abs: 3.6 10*3/uL (ref 1.4–7.7)
Neutrophils Relative %: 68.8 % (ref 43.0–77.0)
Platelets: 253 10*3/uL (ref 150.0–400.0)
RBC: 4.16 Mil/uL (ref 3.87–5.11)
RDW: 25.4 % — ABNORMAL HIGH (ref 11.5–15.5)
WBC: 5.3 10*3/uL (ref 4.0–10.5)

## 2022-02-11 LAB — LIPID PANEL
Cholesterol: 193 mg/dL (ref 0–200)
HDL: 82.6 mg/dL (ref >39.00)
LDL Cholesterol: 84 mg/dL (ref 0–99)
NonHDL: 110.85
Total CHOL/HDL Ratio: 2
Triglycerides: 134 mg/dL (ref 0.0–149.0)
VLDL: 26.8 mg/dL (ref 0.0–40.0)

## 2022-02-11 LAB — BASIC METABOLIC PANEL
BUN: 5 mg/dL — ABNORMAL LOW (ref 6–23)
CO2: 34 mEq/L — ABNORMAL HIGH (ref 19–32)
Calcium: 8.7 mg/dL (ref 8.4–10.5)
Chloride: 94 mEq/L — ABNORMAL LOW (ref 96–112)
Creatinine, Ser: 0.56 mg/dL (ref 0.40–1.20)
GFR: 117.26 mL/min (ref >60.00)
Glucose, Bld: 89 mg/dL (ref 70–99)
Potassium: 2.8 mEq/L — CL (ref 3.5–5.1)
Sodium: 142 mEq/L (ref 135–145)

## 2022-02-11 LAB — FOLATE: Folate: 5.3 ng/mL — ABNORMAL LOW (ref >5.9)

## 2022-02-11 LAB — VITAMIN D 25 HYDROXY (VIT D DEFICIENCY, FRACTURES): VITD: 11.38 ng/mL — ABNORMAL LOW (ref 30.00–100.00)

## 2022-02-11 LAB — TSH: TSH: 1.5 u[IU]/mL (ref 0.35–5.50)

## 2022-02-11 MED ORDER — POTASSIUM CHLORIDE CRYS ER 20 MEQ PO TBCR: 40.0000 meq | EXTENDED_RELEASE_TABLET | Freq: Three times a day (TID) | ORAL | 0 refills | Status: DC

## 2022-02-11 MED ORDER — ESCITALOPRAM OXALATE 10 MG PO TABS: 10.0000 mg | ORAL_TABLET | Freq: Every day | ORAL | 0 refills | Status: DC

## 2022-02-11 MED ORDER — CYANOCOBALAMIN 1000 MCG/ML IJ SOLN
1000.0000 ug | Freq: Once | INTRAMUSCULAR | Status: AC
  Administered 2022-02-11: 1000 ug via INTRAMUSCULAR

## 2022-02-11 MED ORDER — VITAMIN D (ERGOCALCIFEROL) 1.25 MG (50000 UNIT) PO CAPS: 50000.0000 [IU] | ORAL_CAPSULE | ORAL | 0 refills | Status: DC

## 2022-02-11 MED ORDER — FOLIC ACID 1 MG PO TABS: 1.0000 mg | ORAL_TABLET | Freq: Every day | ORAL | 1 refills | Status: DC

## 2022-02-11 NOTE — Telephone Encounter (Signed)
CRITICAL VALUE STICKER  CRITICAL VALUE: Potassium 2.8  RECEIVER (on-site recipient of call): Lovena Le A. Alroy Dust, CMA  DATE & TIME NOTIFIED: 02/11/22 at 1644  MESSENGER (representative from lab): Saa  MD NOTIFIED: yes  TIME OF NOTIFICATION: 3013  RESPONSE: awaiting response

## 2022-02-11 NOTE — Progress Notes (Unsigned)
Subjective:  Patient ID: [name], female    DOB: [birth year]  Age: 36 y.o. MRN: [id number]  CC: Annual Exam and Anemia   HPI [name] presents for ***  Outpatient Medications Prior to Visit  Medication Sig Dispense Refill   levalbuterol (XOPENEX HFA) 45 MCG/ACT inhaler INHALE 2 PUFFS INTO THE LUNGS EVERY 6 HOURS AS NEEDED FOR WHEEZE 15 each 3   nebivolol (BYSTOLIC) 10 MG tablet TAKE 1 TABLET BY MOUTH EVERY DAY 90 tablet 0   triamterene-hydrochlorothiazide (DYAZIDE) 37.5-25 MG capsule TAKE 1 CAPSULE BY MOUTH EVERY DAY 90 capsule 0   valsartan (DIOVAN) 160 MG tablet TAKE 1 TABLET BY MOUTH EVERY DAY 90 tablet 0   escitalopram (LEXAPRO) 5 MG tablet TAKE 1 TABLET (5 MG TOTAL) BY MOUTH DAILY. 90 tablet 1   Vitamin D, Ergocalciferol, (DRISDOL) 1.25 MG (50000 UNIT) CAPS capsule Take 1 capsule (50,000 Units total) by mouth every 7 (seven) days. 12 capsule 0   gentamicin cream (GARAMYCIN) 0.1 % Apply 1 application. topically 2 (two) times daily. (Patient not taking: Reported on 02/11/2022) 30 g 1   mupirocin ointment (BACTROBAN) 2 % Apply 1 application. topically 2 (two) times daily. To affected area till better (Patient not taking: Reported on 02/11/2022) 22 g 0   potassium chloride SA (KLOR-CON M) 20 MEQ tablet Take 2 tablets (40 mEq total) by mouth 2 (two) times daily. (Patient not taking: Reported on 02/11/2022) 30 tablet 0   No facility-administered medications prior to visit.    ROS Review of Systems  Objective:  BP 136/88 (BP Location: Left Arm, Patient Position: Sitting, Cuff Size: Large) Comment (Cuff Size): thigh cuff  Pulse (!) 101   Temp 98.7 F (37.1 C) (Oral)   Ht '5\' 9"'$  (1.753 m)   Wt (!) 356 lb 4 oz (161.6 kg)   SpO2 96%   BMI 52.61 kg/m   BP Readings from Last 3 Encounters:  02/11/22 136/88  10/27/21 (!) 176/98  10/23/21 (!) 165/114    Wt Readings from Last 3 Encounters:  02/11/22 (!) 356 lb 4 oz (161.6 kg)  10/27/21 (!) 376 lb (170.6 kg)   10/07/21 (!) 372 lb (168.7 kg)    Physical Exam  Lab Results  Component Value Date   WBC 5.3 02/11/2022   HGB 12.1 02/11/2022   HCT 38.6 02/11/2022   PLT 253.0 02/11/2022   GLUCOSE 89 02/11/2022   CHOL 193 02/11/2022   TRIG 134.0 02/11/2022   HDL 82.60 02/11/2022   LDLDIRECT 87.0 10/17/2020   LDLCALC 84 02/11/2022   ALT 26 10/07/2021   AST 54 (H) 10/07/2021   NA 142 02/11/2022   K 2.8 (LL) 02/11/2022   CL 94 (L) 02/11/2022   CREATININE 0.56 02/11/2022   BUN 5 (L) 02/11/2022   CO2 34 (H) 02/11/2022   TSH 1.50 02/11/2022   HGBA1C 4.9 10/17/2020    No results found.  Assessment & Plan:   Jeanny was seen today for annual exam and anemia.  Diagnoses and all orders for this visit:  Primary hypertension -     TSH; Future -     Basic metabolic panel; Future -     Basic metabolic panel -     TSH  Routine general medical examination at a health care facility -     Lipid panel; Future -     Lipid panel  Severe obesity (BMI >= 40) (HCC) -     TSH; Future -     TSH  B12 deficiency -     CBC with Differential/Platelet; Future -     Folate; Future -     Folate -     CBC with Differential/Platelet -     cyanocobalamin (VITAMIN B12) injection 1,000 mcg  Chronic zinc deficiency -     CBC with Differential/Platelet; Future -     Zinc; Future -     Zinc -     CBC with Differential/Platelet  Dietary folate deficiency anemia -     CBC with Differential/Platelet; Future -     Folate; Future -     Folate -     CBC with Differential/Platelet -     folic acid (FOLVITE) 1 MG tablet; Take 1 tablet (1 mg total) by mouth daily.  Thiamine deficiency -     CBC with Differential/Platelet; Future -     Vitamin B1; Future -     Vitamin B1 -     CBC with Differential/Platelet  High serum parathyroid hormone (PTH) -     VITAMIN D 25 Hydroxy (Vit-D Deficiency, Fractures); Future -     PTH, intact and calcium; Future -     PTH, intact and calcium -     VITAMIN D 25  Hydroxy (Vit-D Deficiency, Fractures)  Vitamin D deficiency -     VITAMIN D 25 Hydroxy (Vit-D Deficiency, Fractures); Future -     VITAMIN D 25 Hydroxy (Vit-D Deficiency, Fractures) -     Vitamin D, Ergocalciferol, (DRISDOL) 1.25 MG (50000 UNIT) CAPS capsule; Take 1 capsule (50,000 Units total) by mouth every 7 (seven) days.  GAD (generalized anxiety disorder) -     escitalopram (LEXAPRO) 10 MG tablet; Take 1 tablet (10 mg total) by mouth daily.  Hypokalemia -     potassium chloride SA (KLOR-CON M) 20 MEQ tablet; Take 2 tablets (40 mEq total) by mouth 3 (three) times daily.   I have discontinued [name]. Cahall's mupirocin ointment, escitalopram, and gentamicin cream. I have also changed her potassium chloride SA. Additionally, I am having her start on escitalopram and folic acid. Lastly, I am having her maintain her valsartan, nebivolol, triamterene-hydrochlorothiazide, levalbuterol, and Vitamin D (Ergocalciferol). We administered cyanocobalamin.  Meds ordered this encounter  Medications   escitalopram (LEXAPRO) 10 MG tablet    Sig: Take 1 tablet (10 mg total) by mouth daily.    Dispense:  90 tablet    Refill:  0   cyanocobalamin (VITAMIN B12) injection 1,000 mcg   potassium chloride SA (KLOR-CON M) 20 MEQ tablet    Sig: Take 2 tablets (40 mEq total) by mouth 3 (three) times daily.    Dispense:  270 tablet    Refill:  0   Vitamin D, Ergocalciferol, (DRISDOL) 1.25 MG (50000 UNIT) CAPS capsule    Sig: Take 1 capsule (50,000 Units total) by mouth every 7 (seven) days.    Dispense:  12 capsule    Refill:  0   folic acid (FOLVITE) 1 MG tablet    Sig: Take 1 tablet (1 mg total) by mouth daily.    Dispense:  90 tablet    Refill:  1     Follow-up: Return in about 6 months (around 08/14/2022).  Scarlette Calico, MD

## 2022-02-11 NOTE — Patient Instructions (Signed)

## 2022-02-16 ENCOUNTER — Telehealth: Payer: Self-pay

## 2022-02-16 DIAGNOSIS — Z0289 Encounter for other administrative examinations: Secondary | ICD-10-CM

## 2022-02-16 NOTE — Telephone Encounter (Signed)
Form has been signed and faxed back.  Copy sent to pt via mail Copy given to charge Original filed with [facility]

## 2022-02-16 NOTE — Telephone Encounter (Signed)
[employer] work accomodation form received, completed, and given to PCP to sign.

## 2022-02-17 ENCOUNTER — Ambulatory Visit: Payer: BC Managed Care – PPO | Admitting: Internal Medicine

## 2022-02-18 LAB — PTH, INTACT AND CALCIUM: PTH: 177 pg/mL — ABNORMAL HIGH (ref 16–77)

## 2022-02-18 LAB — ZINC: Zinc: 55 ug/dL — ABNORMAL LOW (ref 60–130)

## 2022-02-18 LAB — VITAMIN B1

## 2022-02-18 MED ORDER — ZINC GLUCONATE 50 MG PO TABS: 50.0000 mg | ORAL_TABLET | Freq: Every day | ORAL | 1 refills | Status: DC

## 2022-02-19 NOTE — Telephone Encounter (Signed)
[facility] faxed form requesting additional information.   Form has been completed and given to PCP to sign.

## 2022-02-19 NOTE — Telephone Encounter (Signed)
Form has been signed and faxed back 

## 2022-02-25 ENCOUNTER — Other Ambulatory Visit: Payer: Self-pay | Admitting: Internal Medicine

## 2022-02-25 DIAGNOSIS — E876 Hypokalemia: Secondary | ICD-10-CM

## 2022-02-25 DIAGNOSIS — L309 Dermatitis, unspecified: Secondary | ICD-10-CM | POA: Diagnosis not present

## 2022-02-25 DIAGNOSIS — L281 Prurigo nodularis: Secondary | ICD-10-CM | POA: Diagnosis not present

## 2022-02-25 DIAGNOSIS — L218 Other seborrheic dermatitis: Secondary | ICD-10-CM | POA: Diagnosis not present

## 2022-02-25 DIAGNOSIS — L2089 Other atopic dermatitis: Secondary | ICD-10-CM | POA: Diagnosis not present

## 2022-02-25 DIAGNOSIS — I10 Essential (primary) hypertension: Secondary | ICD-10-CM

## 2022-02-25 MED ORDER — POTASSIUM CHLORIDE CRYS ER 20 MEQ PO TBCR: 20.0000 meq | EXTENDED_RELEASE_TABLET | Freq: Three times a day (TID) | ORAL | 0 refills | Status: DC

## 2022-02-26 ENCOUNTER — Other Ambulatory Visit: Payer: Self-pay | Admitting: Internal Medicine

## 2022-02-26 DIAGNOSIS — E876 Hypokalemia: Secondary | ICD-10-CM

## 2022-02-26 DIAGNOSIS — I10 Essential (primary) hypertension: Secondary | ICD-10-CM

## 2022-02-26 MED ORDER — POTASSIUM CHLORIDE CRYS ER 20 MEQ PO TBCR: 20.0000 meq | EXTENDED_RELEASE_TABLET | Freq: Three times a day (TID) | ORAL | 0 refills | Status: DC

## 2022-02-26 NOTE — Telephone Encounter (Signed)
Corrections made and form faxed back.

## 2022-02-26 NOTE — Telephone Encounter (Signed)
Pt states that a call and/or another paper will be sent in to add " for prevention of any infection. As well as COVID due to pt having long term COVID and being hospitalized from  for x6 days." Pt states the above has to be written on her accomodation form.

## 2022-03-20 ENCOUNTER — Encounter: Payer: Self-pay | Admitting: Internal Medicine

## 2022-03-23 ENCOUNTER — Other Ambulatory Visit: Payer: Self-pay | Admitting: Internal Medicine

## 2022-03-23 DIAGNOSIS — E876 Hypokalemia: Secondary | ICD-10-CM | POA: Insufficient documentation

## 2022-03-23 DIAGNOSIS — I10 Essential (primary) hypertension: Secondary | ICD-10-CM

## 2022-03-23 MED ORDER — POTASSIUM CHLORIDE ER 10 MEQ PO TBCR: 10.0000 meq | EXTENDED_RELEASE_TABLET | Freq: Three times a day (TID) | ORAL | 0 refills | Status: DC

## 2022-03-23 NOTE — Progress Notes (Unsigned)
Lab Results  Component Value Date   WBC 5.3 02/11/2022   HGB 12.1 02/11/2022   HCT 38.6 02/11/2022   PLT 253.0 02/11/2022   GLUCOSE 89 02/11/2022   CHOL 193 02/11/2022   TRIG 134.0 02/11/2022   HDL 82.60 02/11/2022   LDLDIRECT 87.0 10/17/2020   LDLCALC 84 02/11/2022   ALT 26 10/07/2021   AST 54 (H) 10/07/2021   NA 142 02/11/2022   K 2.8 (LL) 02/11/2022   CL 94 (L) 02/11/2022   CREATININE 0.56 02/11/2022   BUN 5 (L) 02/11/2022   CO2 34 (H) 02/11/2022   TSH 1.50 02/11/2022   HGBA1C 4.9 10/17/2020

## 2022-05-29 ENCOUNTER — Other Ambulatory Visit: Payer: Self-pay | Admitting: Internal Medicine

## 2022-05-29 DIAGNOSIS — F411 Generalized anxiety disorder: Secondary | ICD-10-CM

## 2022-05-29 MED ORDER — ESCITALOPRAM OXALATE 10 MG PO TABS: 10.0000 mg | ORAL_TABLET | Freq: Every day | ORAL | 1 refills | Status: DC

## 2022-05-30 ENCOUNTER — Other Ambulatory Visit: Payer: Self-pay | Admitting: Internal Medicine

## 2022-05-30 DIAGNOSIS — I10 Essential (primary) hypertension: Secondary | ICD-10-CM

## 2022-06-09 ENCOUNTER — Encounter: Payer: Self-pay | Admitting: Internal Medicine

## 2022-06-09 DIAGNOSIS — J4489 Other specified chronic obstructive pulmonary disease: Secondary | ICD-10-CM

## 2022-06-10 MED ORDER — LEVALBUTEROL TARTRATE 45 MCG/ACT IN AERO: INHALATION_SPRAY | RESPIRATORY_TRACT | 3 refills | Status: DC

## 2022-06-22 ENCOUNTER — Other Ambulatory Visit: Payer: Self-pay | Admitting: Internal Medicine

## 2022-06-22 DIAGNOSIS — E876 Hypokalemia: Secondary | ICD-10-CM

## 2022-06-22 DIAGNOSIS — I10 Essential (primary) hypertension: Secondary | ICD-10-CM

## 2022-07-14 DIAGNOSIS — Z79899 Other long term (current) drug therapy: Secondary | ICD-10-CM | POA: Diagnosis not present

## 2022-07-14 DIAGNOSIS — L281 Prurigo nodularis: Secondary | ICD-10-CM | POA: Diagnosis not present

## 2022-07-14 DIAGNOSIS — L2089 Other atopic dermatitis: Secondary | ICD-10-CM | POA: Diagnosis not present

## 2022-07-16 ENCOUNTER — Encounter: Payer: Self-pay | Admitting: Family Medicine

## 2022-07-16 ENCOUNTER — Ambulatory Visit: Payer: BC Managed Care – PPO | Admitting: Family Medicine

## 2022-07-16 VITALS — BP 132/90 | HR 95 | Temp 97.6°F | Ht 69.0 in | Wt 321.0 lb

## 2022-07-16 DIAGNOSIS — R7989 Other specified abnormal findings of blood chemistry: Secondary | ICD-10-CM | POA: Diagnosis not present

## 2022-07-16 DIAGNOSIS — R630 Anorexia: Secondary | ICD-10-CM

## 2022-07-16 DIAGNOSIS — F32A Depression, unspecified: Secondary | ICD-10-CM

## 2022-07-16 DIAGNOSIS — D508 Other iron deficiency anemias: Secondary | ICD-10-CM

## 2022-07-16 DIAGNOSIS — E519 Thiamine deficiency, unspecified: Secondary | ICD-10-CM

## 2022-07-16 DIAGNOSIS — R200 Anesthesia of skin: Secondary | ICD-10-CM

## 2022-07-16 DIAGNOSIS — E559 Vitamin D deficiency, unspecified: Secondary | ICD-10-CM

## 2022-07-16 DIAGNOSIS — Z9884 Bariatric surgery status: Secondary | ICD-10-CM

## 2022-07-16 DIAGNOSIS — R202 Paresthesia of skin: Secondary | ICD-10-CM | POA: Diagnosis not present

## 2022-07-16 DIAGNOSIS — E538 Deficiency of other specified B group vitamins: Secondary | ICD-10-CM

## 2022-07-16 DIAGNOSIS — D520 Dietary folate deficiency anemia: Secondary | ICD-10-CM

## 2022-07-16 DIAGNOSIS — E60 Dietary zinc deficiency: Secondary | ICD-10-CM

## 2022-07-16 DIAGNOSIS — F419 Anxiety disorder, unspecified: Secondary | ICD-10-CM

## 2022-07-16 LAB — COMPREHENSIVE METABOLIC PANEL
ALT: 65 U/L — ABNORMAL HIGH (ref 0–35)
AST: 247 U/L — ABNORMAL HIGH (ref 0–37)
Albumin: 3.7 g/dL (ref 3.5–5.2)
Alkaline Phosphatase: 121 U/L — ABNORMAL HIGH (ref 39–117)
BUN: 4 mg/dL — ABNORMAL LOW (ref 6–23)
CO2: 31 mEq/L (ref 19–32)
Calcium: 8.9 mg/dL (ref 8.4–10.5)
Chloride: 94 mEq/L — ABNORMAL LOW (ref 96–112)
Creatinine, Ser: 0.61 mg/dL (ref 0.40–1.20)
GFR: 114.53 mL/min (ref >60.00)
Glucose, Bld: 100 mg/dL — ABNORMAL HIGH (ref 70–99)
Potassium: 2.9 mEq/L — ABNORMAL LOW (ref 3.5–5.1)
Sodium: 142 mEq/L (ref 135–145)
Total Bilirubin: 1 mg/dL (ref 0.2–1.2)
Total Protein: 6.9 g/dL (ref 6.0–8.3)

## 2022-07-16 LAB — CBC WITH DIFFERENTIAL/PLATELET
Basophils Absolute: 0 10*3/uL (ref 0.0–0.1)
Basophils Relative: 0.5 % (ref 0.0–3.0)
Eosinophils Absolute: 0.1 10*3/uL (ref 0.0–0.7)
Eosinophils Relative: 2 % (ref 0.0–5.0)
HCT: 38.6 % (ref 36.0–46.0)
Hemoglobin: 12.9 g/dL (ref 12.0–15.0)
Lymphocytes Relative: 10.7 % — ABNORMAL LOW (ref 12.0–46.0)
Lymphs Abs: 0.7 10*3/uL (ref 0.7–4.0)
MCHC: 33.4 g/dL (ref 30.0–36.0)
MCV: 92.3 fl (ref 78.0–100.0)
Monocytes Absolute: 0.7 10*3/uL (ref 0.1–1.0)
Monocytes Relative: 11 % (ref 3.0–12.0)
Neutro Abs: 4.9 10*3/uL (ref 1.4–7.7)
Neutrophils Relative %: 75.8 % (ref 43.0–77.0)
Platelets: 148 10*3/uL — ABNORMAL LOW (ref 150.0–400.0)
RBC: 4.18 Mil/uL (ref 3.87–5.11)
RDW: 19.7 % — ABNORMAL HIGH (ref 11.5–15.5)
WBC: 6.4 10*3/uL (ref 4.0–10.5)

## 2022-07-16 LAB — VITAMIN D 25 HYDROXY (VIT D DEFICIENCY, FRACTURES): VITD: 11.51 ng/mL — ABNORMAL LOW (ref 30.00–100.00)

## 2022-07-16 LAB — FOLATE: Folate: 12.5 ng/mL (ref >5.9)

## 2022-07-16 LAB — TSH: TSH: 1.28 u[IU]/mL (ref 0.35–5.50)

## 2022-07-16 LAB — VITAMIN B12: Vitamin B-12: 351 pg/mL (ref 211–911)

## 2022-07-16 NOTE — Patient Instructions (Addendum)
Please call [facility] to schedule your appointment at [phone].  We will be in touch with your lab results.

## 2022-07-16 NOTE — Progress Notes (Signed)
Subjective:     Patient ID: [name], female    DOB: [birth year], 37 y.o.   MRN: [id number]  Chief Complaint  Patient presents with   poor appitite    Started August 2023, thought this was due to her eczema injections so they stopped and recently just started on new injections but still having this issue goes days without eating   Tingling    Tingling in both legs down to feet for about 2-3 months. Reports sometimes her feet feel cold    HPI Patient is in today for multiple concerns. C/o numbness in bilateral numbness of her lower legs. She has had this in the past. No weakness.   States she has a poor appetite.  She does feel hungry but nothing sounds appealing.  She has lost a significant amount of weight. 35 lbs since August   Eczema and is being treated by dermatologist. She was taking Dupixent. Wonders if this medication may have caused her loss of appetite. Stopped Dupixent in September 20-23.  This week started a new medication but cannot recall the name of it. She will call us.   States she takes Lexapro as needed for anxiety and depression.   Hx of elevated PTH and referred to endocrinologist. States she was not aware and did not schedule visit.   Hx of gastric bypass and several deficiencies. Reports only taking folate and vitamin D supplements.   States she needs time off from work. States her health is causing her to have stress and she cannot manage the added stress of her job.   Denies fever, chills, dizziness, chest pain, palpitations, shortness of breath, abdominal pain, vomiting, diarrhea, urinary symptoms.       02/11/2022    1:41 PM 10/01/2021    8:47 AM 10/17/2020   11:22 AM 10/17/2020   10:10 AM 07/28/2017   10:39 AM  Depression screen PHQ 2/9  Decreased Interest '2 3 3 3 '$ 0  Down, Depressed, Hopeless '2 3 2 2 '$ 0  PHQ - 2 Score '4 6 5 5 '$ 0  Altered sleeping '3 3 3  2  '$ Tired, decreased energy '3 3 2  '$ 0  Change in appetite '2 3 3  '$ 0  Feeling bad or  failure about yourself  0 0 3  0  Trouble concentrating 0 0 2  0  Moving slowly or fidgety/restless 0 0 2  0  Suicidal thoughts 0 0 0  0  PHQ-9 Score '12 15 20  2  '$ Difficult doing work/chores Somewhat difficult          Health Maintenance Due  Topic Date Due   COVID-19 Vaccine (1) Never done   [name] SMEAR-Modifier  04/26/2020   INFLUENZA VACCINE  Never done    Past Medical History:  Diagnosis Date   Anemia    Blood transfusion without reported diagnosis 2005   Morbid obesity ([place])    Sleep apnea    hx of    Past Surgical History:  Procedure Laterality Date   BREATH TEK H PYLORI  02/09/2012   Procedure: BREATH TEK H PYLORI;  Surgeon: Pedro Earls, MD;  Location: [facility];  Service: General;  Laterality: N/A;   GASTRIC BYPASS  08/29/2012   HERNIA REPAIR  2014    Family History  Problem Relation Age of Onset   Hypertension Father    Hypertension Mother    Alcohol abuse Neg Hx    Arthritis Neg Hx    Cancer Neg Hx  Early death Neg Hx    Heart disease Neg Hx    Hyperlipidemia Neg Hx    Kidney disease Neg Hx    Stroke Neg Hx     Social History   Socioeconomic History   Marital status: Single    Spouse name: Not on file   Number of children: Not on file   Years of education: Not on file   Highest education level: Not on file  Occupational History   Not on file  Tobacco Use   Smoking status: Never    Passive exposure: Never   Smokeless tobacco: Never  Substance and Sexual Activity   Alcohol use: No   Drug use: No   Sexual activity: Not Currently    Partners: Male    Birth control/protection: Pill  Other Topics Concern   Not on file  Social History Narrative   Not on file   Social Determinants of Health   Financial Resource Strain: Not on file  Food Insecurity: Not on file  Transportation Needs: Not on file  Physical Activity: Not on file  Stress: Not on file  Social Connections: Not on file  Intimate Partner Violence: Not on file     Outpatient Medications Prior to Visit  Medication Sig Dispense Refill   escitalopram (LEXAPRO) 10 MG tablet Take 1 tablet (10 mg total) by mouth daily. 90 tablet 1   folic acid (FOLVITE) 1 MG tablet Take 1 tablet (1 mg total) by mouth daily. 90 tablet 1   levalbuterol (XOPENEX HFA) 45 MCG/ACT inhaler INHALE 2 PUFFS INTO THE LUNGS EVERY 6 HOURS AS NEEDED FOR WHEEZE 15 each 3   nebivolol (BYSTOLIC) 10 MG tablet TAKE 1 TABLET BY MOUTH EVERY DAY 90 tablet 0   triamterene-hydrochlorothiazide (DYAZIDE) 37.5-25 MG capsule TAKE 1 CAPSULE BY MOUTH EVERY DAY 90 capsule 0   valsartan (DIOVAN) 160 MG tablet TAKE 1 TABLET BY MOUTH EVERY DAY 90 tablet 0   zinc gluconate 50 MG tablet Take 1 tablet (50 mg total) by mouth daily. 90 tablet 1   potassium chloride (KLOR-CON) 10 MEQ tablet TAKE 1 TABLET (10 MEQ TOTAL) BY MOUTH 3 (THREE) TIMES DAILY. 270 tablet 0   Vitamin D, Ergocalciferol, (DRISDOL) 1.25 MG (50000 UNIT) CAPS capsule Take 1 capsule (50,000 Units total) by mouth every 7 (seven) days. 12 capsule 0   No facility-administered medications prior to visit.    No Known Allergies  ROS     Objective:    Physical Exam Constitutional:      General: She is not in acute distress.    Appearance: She is not ill-appearing.  HENT:     Mouth/Throat:     Mouth: Mucous membranes are moist.  Eyes:     Extraocular Movements: Extraocular movements intact.     Conjunctiva/sclera: Conjunctivae normal.     Pupils: Pupils are equal, round, and reactive to light.  Cardiovascular:     Rate and Rhythm: Normal rate and regular rhythm.  Pulmonary:     Effort: Pulmonary effort is normal.     Breath sounds: Normal breath sounds.  Skin:    General: Skin is warm and dry.     Findings: Rash present.     Comments: Eczema type rash -diffuse  Neurological:     General: No focal deficit present.     Mental Status: She is alert and oriented to person, place, and time.     Cranial Nerves: No cranial nerve  deficit.     Sensory: No  sensory deficit.     Motor: No weakness.     Coordination: Coordination normal.     Gait: Gait normal.     Deep Tendon Reflexes: Reflexes normal.  Psychiatric:        Attention and Perception: Attention normal.        Speech: Speech normal.        Behavior: Behavior normal.        Thought Content: Thought content normal.     BP (!) 132/90 (BP Location: Left Arm, Patient Position: Sitting, Cuff Size: Large)   Pulse 95   Temp 97.6 F (36.4 C) (Temporal)   Ht '5\' 9"'$  (1.753 m)   Wt (!) 321 lb (145.6 kg)   SpO2 98%   BMI 47.40 kg/m  Wt Readings from Last 3 Encounters:  07/16/22 (!) 321 lb (145.6 kg)  02/11/22 (!) 356 lb 4 oz (161.6 kg)  10/27/21 (!) 376 lb (170.6 kg)       Assessment & Plan:   Problem List Items Addressed This Visit       Other   B12 deficiency   Relevant Orders   Vitamin B12 (Completed)   Chronic zinc deficiency   Relevant Orders   Zinc   Dietary folate deficiency anemia   History of Roux-en-Y gastric bypass   Iron deficiency anemia   Relevant Orders   Folate (Completed)   Iron, TIBC and Ferritin Panel   Numbness and tingling of both lower extremities - Primary   Relevant Orders   CBC with Differential/Platelet (Completed)   Comprehensive metabolic panel (Completed)   TSH (Completed)   Vitamin B12 (Completed)   Zinc   Vitamin B1   Folate (Completed)   Iron, TIBC and Ferritin Panel   Thiamine deficiency   Relevant Orders   Vitamin B1   Vitamin D deficiency   Relevant Orders   VITAMIN D 25 Hydroxy (Vit-D Deficiency, Fractures) (Completed)   Other Visit Diagnoses     Poor appetite       Elevated parathyroid hormone       Relevant Orders   PTH, intact and calcium   Anxiety and depression          No acute distress.  Under the care of dermatologist for Eczema.  Numbness of bilateral LE with hx of same.  Hx of multiple deficiencies. Check labs and follow up.  She has had significant weight loss with poor  appetite. Her Eczema medication was switched to see if this helped. She will call back with the name of her current treatment.  Discussed referral to endo in past for elevated PTH. She will call to schedule.  Requesting FMLA paperwork due to stress related to health.  She will follow up with her PCP next week.    I am having Gwinda Maine maintain her valsartan, folic acid, zinc gluconate, escitalopram, nebivolol, triamterene-hydrochlorothiazide, and levalbuterol.  No orders of the defined types were placed in this encounter.

## 2022-07-17 ENCOUNTER — Other Ambulatory Visit: Payer: Self-pay | Admitting: Family Medicine

## 2022-07-17 DIAGNOSIS — E559 Vitamin D deficiency, unspecified: Secondary | ICD-10-CM

## 2022-07-17 DIAGNOSIS — E876 Hypokalemia: Secondary | ICD-10-CM

## 2022-07-17 MED ORDER — VITAMIN D (ERGOCALCIFEROL) 1.25 MG (50000 UNIT) PO CAPS: 50000.0000 [IU] | ORAL_CAPSULE | ORAL | 0 refills | Status: DC

## 2022-07-17 MED ORDER — POTASSIUM CHLORIDE CRYS ER 20 MEQ PO TBCR: 20.0000 meq | EXTENDED_RELEASE_TABLET | Freq: Two times a day (BID) | ORAL | 0 refills | Status: DC

## 2022-07-17 NOTE — Progress Notes (Signed)
Her potassium is low. This could be causing her symptoms. I am sending in a potassium supplement for her to take twice daily for the next 5 days. She will need to have this rechecked. Her vitamin D is also severely low. I will send in a high dose prescription for her to take once weekly. Her liver enzymes are significantly elevated and need to be rechecked. Does she have an appt with Dr. Ronnald Ramp soon? I am still waiting on other lab results.

## 2022-07-21 ENCOUNTER — Encounter: Payer: Self-pay | Admitting: Internal Medicine

## 2022-07-21 ENCOUNTER — Ambulatory Visit: Payer: BC Managed Care – PPO | Admitting: Internal Medicine

## 2022-07-21 VITALS — BP 124/76 | HR 121 | Temp 98.2°F | Resp 16 | Ht 69.0 in | Wt 320.0 lb

## 2022-07-21 DIAGNOSIS — L2084 Intrinsic (allergic) eczema: Secondary | ICD-10-CM

## 2022-07-21 DIAGNOSIS — E876 Hypokalemia: Secondary | ICD-10-CM

## 2022-07-21 DIAGNOSIS — E519 Thiamine deficiency, unspecified: Secondary | ICD-10-CM

## 2022-07-21 DIAGNOSIS — R634 Abnormal weight loss: Secondary | ICD-10-CM

## 2022-07-21 DIAGNOSIS — D696 Thrombocytopenia, unspecified: Secondary | ICD-10-CM | POA: Diagnosis not present

## 2022-07-21 DIAGNOSIS — R200 Anesthesia of skin: Secondary | ICD-10-CM

## 2022-07-21 DIAGNOSIS — E5111 Dry beriberi: Secondary | ICD-10-CM

## 2022-07-21 DIAGNOSIS — R202 Paresthesia of skin: Secondary | ICD-10-CM | POA: Diagnosis not present

## 2022-07-21 DIAGNOSIS — R7989 Other specified abnormal findings of blood chemistry: Secondary | ICD-10-CM

## 2022-07-21 DIAGNOSIS — F332 Major depressive disorder, recurrent severe without psychotic features: Secondary | ICD-10-CM

## 2022-07-21 DIAGNOSIS — E60 Dietary zinc deficiency: Secondary | ICD-10-CM

## 2022-07-21 DIAGNOSIS — D508 Other iron deficiency anemias: Secondary | ICD-10-CM | POA: Diagnosis not present

## 2022-07-21 DIAGNOSIS — Z0289 Encounter for other administrative examinations: Secondary | ICD-10-CM

## 2022-07-21 DIAGNOSIS — R000 Tachycardia, unspecified: Secondary | ICD-10-CM | POA: Diagnosis not present

## 2022-07-21 HISTORY — DX: Thrombocytopenia, unspecified: D69.6

## 2022-07-21 LAB — PTH, INTACT AND CALCIUM
Calcium: 8.3 mg/dL — ABNORMAL LOW (ref 8.6–10.2)
PTH: 26 pg/mL (ref 16–77)

## 2022-07-21 LAB — IBC + FERRITIN
Ferritin: 126.2 ng/mL (ref 10.0–291.0)
Iron: 191 ug/dL — ABNORMAL HIGH (ref 42–145)
Saturation Ratios: 53.9 % — ABNORMAL HIGH (ref 20.0–50.0)
TIBC: 354.2 ug/dL (ref 250.0–450.0)
Transferrin: 253 mg/dL (ref 212.0–360.0)

## 2022-07-21 LAB — ZINC: Zinc: 68 ug/dL (ref 60–130)

## 2022-07-21 LAB — EXTRA SPECIMEN

## 2022-07-21 LAB — IRON,TIBC AND FERRITIN PANEL
%SAT: 18 % (calc) (ref 16–45)
Ferritin: 367 ng/mL — ABNORMAL HIGH (ref 16–154)
Iron: 60 ug/dL (ref 40–190)
TIBC: 334 mcg/dL (calc) (ref 250–450)

## 2022-07-21 LAB — VITAMIN B1: Vitamin B1 (Thiamine): 9 nmol/L (ref 8–30)

## 2022-07-21 MED ORDER — ZINC GLUCONATE 50 MG PO TABS: 50.0000 mg | ORAL_TABLET | Freq: Every day | ORAL | 1 refills | Status: DC

## 2022-07-21 MED ORDER — POTASSIUM CHLORIDE CRYS ER 10 MEQ PO TBCR: 10.0000 meq | EXTENDED_RELEASE_TABLET | Freq: Two times a day (BID) | ORAL | 0 refills | Status: DC

## 2022-07-21 NOTE — Progress Notes (Signed)
Subjective:  Patient ID: [name], female    DOB: 06-Aug-1985  Age: 37 y.o. MRN: [id number]  CC: Abdominal Pain, Anemia, Hypertension, and Depression   HPI [name] presents for f/up -  She complains that the current potassium tablet is too big for her to swallow and she is therefore not taking it.  She complains of a 38-monthhistory of loss of appetite, weight loss, worsening skin rash, and numbness and tingling in her legs.  She is also had a few episodes of nausea and vomiting.  She describes having a nervous breakdown at work a few days ago and tells me she needs some time off from work..  She feels hopeless, helpless, anxious, and depressed.  She does not feel suicidal or homicidal.  Outpatient Medications Prior to Visit  Medication Sig Dispense Refill   escitalopram (LEXAPRO) 10 MG tablet Take 1 tablet (10 mg total) by mouth daily. 90 tablet 1   folic acid (FOLVITE) 1 MG tablet Take 1 tablet (1 mg total) by mouth daily. 90 tablet 1   levalbuterol (XOPENEX HFA) 45 MCG/ACT inhaler INHALE 2 PUFFS INTO THE LUNGS EVERY 6 HOURS AS NEEDED FOR WHEEZE 15 each 3   nebivolol (BYSTOLIC) 10 MG tablet TAKE 1 TABLET BY MOUTH EVERY DAY 90 tablet 0   triamterene-hydrochlorothiazide (DYAZIDE) 37.5-25 MG capsule TAKE 1 CAPSULE BY MOUTH EVERY DAY 90 capsule 0   valsartan (DIOVAN) 160 MG tablet TAKE 1 TABLET BY MOUTH EVERY DAY 90 tablet 0   Vitamin D, Ergocalciferol, (DRISDOL) 1.25 MG (50000 UNIT) CAPS capsule Take 1 capsule (50,000 Units total) by mouth every 7 (seven) days. 12 capsule 0   potassium chloride SA (KLOR-CON M) 20 MEQ tablet Take 1 tablet (20 mEq total) by mouth 2 (two) times daily. 10 tablet 0   zinc gluconate 50 MG tablet Take 1 tablet (50 mg total) by mouth daily. 90 tablet 1   No facility-administered medications prior to visit.    ROS Review of Systems  Constitutional:  Positive for appetite change, fatigue and unexpected weight change. Negative for chills,  diaphoresis and fever.  HENT: Negative.  Negative for trouble swallowing and voice change.   Eyes: Negative.   Respiratory:  Negative for cough, chest tightness and wheezing.   Cardiovascular:  Negative for chest pain, palpitations and leg swelling.  Gastrointestinal:  Positive for abdominal pain, nausea and vomiting. Negative for constipation and diarrhea.  Endocrine: Negative.   Genitourinary: Negative.  Negative for difficulty urinating.  Musculoskeletal: Negative.  Negative for back pain.  Skin:  Positive for rash.  Hematological:  Negative for adenopathy. Does not bruise/bleed easily.  Psychiatric/Behavioral:  Positive for decreased concentration, dysphoric mood and sleep disturbance. Negative for behavioral problems, confusion and suicidal ideas. The patient is nervous/anxious.     Objective:  BP 124/76 (BP Location: Left Arm, Patient Position: Sitting, Cuff Size: Large)   Pulse (!) 121   Temp 98.2 F (36.8 C) (Oral)   Ht '5\' 9"'$  (1.753 m)   Wt (!) 320 lb (145.2 kg)   SpO2 98%   BMI 47.26 kg/m   BP Readings from Last 3 Encounters:  07/21/22 124/76  07/16/22 (!) 132/90  02/11/22 136/88    Wt Readings from Last 3 Encounters:  07/21/22 (!) 320 lb (145.2 kg)  07/16/22 (!) 321 lb (145.6 kg)  02/11/22 (!) 356 lb 4 oz (161.6 kg)    Physical Exam Vitals reviewed.  HENT:     Mouth/Throat:  Mouth: Mucous membranes are moist.  Eyes:     General: No scleral icterus.    Conjunctiva/sclera: Conjunctivae normal.  Cardiovascular:     Rate and Rhythm: Regular rhythm. Tachycardia present.     Heart sounds: Normal heart sounds, S1 normal and S2 normal. No murmur heard.    Comments: EKG- ST, 116 bpm ?LAE ++artifact No LVH or Q waves Pulmonary:     Effort: Pulmonary effort is normal.     Breath sounds: No stridor. No wheezing, rhonchi or rales.  Abdominal:     General: Abdomen is protuberant. Bowel sounds are normal. There is no distension.     Palpations: Abdomen is soft.  There is no hepatomegaly, splenomegaly or mass.     Tenderness: There is no abdominal tenderness.  Musculoskeletal:        General: Normal range of motion.     Cervical back: Neck supple.     Right lower leg: No edema.     Left lower leg: No edema.  Lymphadenopathy:     Cervical: No cervical adenopathy.  Skin:    General: Skin is warm and dry.  Neurological:     General: No focal deficit present.     Mental Status: She is alert.  Psychiatric:        Attention and Perception: She is inattentive.        Mood and Affect: Mood is anxious and depressed. Affect is flat and tearful.        Speech: Speech normal.        Behavior: Behavior normal. Behavior is cooperative.        Thought Content: Thought content normal. Thought content is not paranoid or delusional. Thought content does not include homicidal or suicidal ideation.        Cognition and Memory: Cognition normal.        Judgment: Judgment normal.     Lab Results  Component Value Date   WBC 6.4 07/16/2022   HGB 12.9 07/16/2022   HCT 38.6 07/16/2022   PLT 148.0 (L) 07/16/2022   GLUCOSE 100 (H) 07/16/2022   CHOL 193 02/11/2022   TRIG 134.0 02/11/2022   HDL 82.60 02/11/2022   LDLDIRECT 87.0 10/17/2020   LDLCALC 84 02/11/2022   ALT 65 (H) 07/16/2022   AST 247 (H) 07/16/2022   NA 142 07/16/2022   K 2.9 (L) 07/16/2022   CL 94 (L) 07/16/2022   CREATININE 0.61 07/16/2022   BUN 4 (L) 07/16/2022   CO2 31 07/16/2022   TSH 1.28 07/16/2022   HGBA1C 4.9 10/17/2020    No results found.   Assessment & Plan:   Diagnoses and all orders for this visit:  Thrombocytopenia ([place])- There is no bleeding or bruising.  Iron deficiency anemia secondary to inadequate dietary iron intake -     IBC + Ferritin; Future -     IBC + Ferritin  Chronic zinc deficiency -     zinc gluconate 50 MG tablet; Take 1 tablet (50 mg total) by mouth daily.  Thiamine deficiency -     Vitamin B1; Future -     Vitamin B1 -     thiamine (VITAMIN  B-1) 50 MG tablet; Take 1 tablet (50 mg total) by mouth daily.  Elevated LFTs- With the loss of appetite and weight loss I am concerned about malignancy. -     MR Abdomen W Wo Contrast; Future  Numbness and tingling of both lower extremities- Will treat for vitamin deficiencies. -  Vitamin B6; Future -     Vitamin B6  Hypokalemia -     potassium chloride (KLOR-CON M) 10 MEQ tablet; Take 1 tablet (10 mEq total) by mouth 2 (two) times daily. -     EKG 12-Lead  Weight loss, abnormal -     MR Abdomen W Wo Contrast; Future  Tachycardia- This is c/w anxiety. -     Cancel: EKG 12-Lead -     EKG 12-Lead  Intrinsic eczema  Thiamine deficiency neuropathy   I have discontinued Klea R. Genther's potassium chloride SA. I am also having her start on potassium chloride and thiamine. Additionally, I am having her maintain her valsartan, folic acid, escitalopram, nebivolol, triamterene-hydrochlorothiazide, levalbuterol, Vitamin D (Ergocalciferol), and zinc gluconate.  Meds ordered this encounter  Medications   zinc gluconate 50 MG tablet    Sig: Take 1 tablet (50 mg total) by mouth daily.    Dispense:  90 tablet    Refill:  1   potassium chloride (KLOR-CON M) 10 MEQ tablet    Sig: Take 1 tablet (10 mEq total) by mouth 2 (two) times daily.    Dispense:  180 tablet    Refill:  0   thiamine (VITAMIN B-1) 50 MG tablet    Sig: Take 1 tablet (50 mg total) by mouth daily.    Dispense:  90 tablet    Refill:  1     Follow-up: Return in about 3 months (around 10/20/2022).  Scarlette Calico, MD

## 2022-07-21 NOTE — Patient Instructions (Signed)
Fatty Liver Disease  The liver converts food into energy, removes toxic material from the blood, makes important proteins, and absorbs necessary vitamins from food. Fatty liver disease occurs when too much fat has built up in your liver cells. Fatty liver disease is also called hepatic steatosis. In many cases, fatty liver disease does not cause symptoms or problems. It is often diagnosed when tests are being done for other reasons. However, over time, fatty liver can cause inflammation that may lead to more serious liver problems, such as scarring of the liver (cirrhosis) and liver failure. Fatty liver is associated with insulin resistance, increased body fat, high blood pressure (hypertension), and high cholesterol. These are features of metabolic syndrome and increase your risk for stroke, diabetes, and heart disease. What are the causes? This condition may be caused by components of metabolic syndrome: Obesity. Insulin resistance. High cholesterol. Other causes: Alcohol abuse. Poor nutrition. Cushing syndrome. Pregnancy. Certain drugs. Poisons. Some viral infections. What increases the risk? You are more likely to develop this condition if you: Abuse alcohol. Are overweight. Have diabetes. Have hepatitis. Have a high triglyceride level. Are pregnant. What are the signs or symptoms? Fatty liver disease often does not cause symptoms. If symptoms do develop, they can include: Fatigue and weakness. Weight loss. Confusion. Nausea, vomiting, or abdominal pain. Yellowing of your skin and the white parts of your eyes (jaundice). Itchy skin. How is this diagnosed? This condition may be diagnosed by: A physical exam and your medical history. Blood tests. Imaging tests, such as an ultrasound, CT scan, or MRI. A liver biopsy. A small sample of liver tissue is removed using a needle. The sample is then looked at under a microscope. How is this treated? Fatty liver disease is often  caused by other health conditions. Treatment for fatty liver may involve medicines and lifestyle changes to manage conditions such as: Alcoholism. High cholesterol. Diabetes. Being overweight or obese. Follow these instructions at home:  Do not drink alcohol. If you have trouble quitting, ask your health care provider how to safely quit with the help of medicine or a supervised program. This is important to keep your condition from getting worse. Eat a healthy diet as told by your health care provider. Ask your health care provider about working with a dietitian to develop an eating plan. Exercise regularly. This can help you lose weight and control your cholesterol and diabetes. Talk to your health care provider about an exercise plan and which activities are best for you. Take over-the-counter and prescription medicines only as told by your health care provider. Keep all follow-up visits. This is important. Contact a health care provider if: You have trouble controlling your: Blood sugar. This is especially important if you have diabetes. Cholesterol. Drinking of alcohol. Get help right away if: You have abdominal pain. You have jaundice. You have nausea and are vomiting. You vomit blood or material that looks like coffee grounds. You have stools that are black, tar-like, or bloody. Summary Fatty liver disease develops when too much fat builds up in the cells of your liver. Fatty liver disease often causes no symptoms or problems. However, over time, fatty liver can cause inflammation that may lead to more serious liver problems, such as scarring of the liver (cirrhosis). You are more likely to develop this condition if you abuse alcohol, are pregnant, are overweight, have diabetes, have hepatitis, or have high triglyceride or cholesterol levels. Contact your health care provider if you have trouble controlling your blood   sugar, cholesterol, or drinking of alcohol. This information is  not intended to replace advice given to you by your health care provider. Make sure you discuss any questions you have with your health care provider. Document Revised: 04/04/2020 Document Reviewed: 04/04/2020 Elsevier Patient Education  2023 [employer]  

## 2022-07-22 NOTE — Progress Notes (Signed)
Just FYI. I saw that she was here to see you yesterday.

## 2022-07-25 ENCOUNTER — Encounter: Payer: Self-pay | Admitting: Internal Medicine

## 2022-07-25 DIAGNOSIS — E5111 Dry beriberi: Secondary | ICD-10-CM | POA: Insufficient documentation

## 2022-07-25 DIAGNOSIS — L2084 Intrinsic (allergic) eczema: Secondary | ICD-10-CM | POA: Insufficient documentation

## 2022-07-25 HISTORY — DX: Dry beriberi: E51.11

## 2022-07-25 LAB — VITAMIN B6: Vitamin B6: 5.6 ng/mL (ref 2.1–21.7)

## 2022-07-25 LAB — VITAMIN B1: Vitamin B1 (Thiamine): <6 nmol/L — ABNORMAL LOW (ref 8–30)

## 2022-07-25 MED ORDER — VITAMIN B-1 50 MG PO TABS: 50.0000 mg | ORAL_TABLET | Freq: Every day | ORAL | 1 refills | Status: DC

## 2022-07-27 ENCOUNTER — Other Ambulatory Visit: Payer: Self-pay | Admitting: Internal Medicine

## 2022-07-27 DIAGNOSIS — F332 Major depressive disorder, recurrent severe without psychotic features: Secondary | ICD-10-CM | POA: Insufficient documentation

## 2022-07-27 DIAGNOSIS — R7989 Other specified abnormal findings of blood chemistry: Secondary | ICD-10-CM

## 2022-07-27 NOTE — Telephone Encounter (Signed)
Patient called to follow up because its due by Wednesday. Please call or message with response

## 2022-07-27 NOTE — Telephone Encounter (Signed)
FMLA copy mailed to pt Copy sent to charge Original filed with [employer]

## 2022-07-28 NOTE — Telephone Encounter (Signed)
FMLA has been faxed again.

## 2022-07-28 NOTE — Telephone Encounter (Signed)
Patient called and stated that her job only received 2 out of the 6 pages they needed. She states the deadline is tomorrow and was hoping we could refax it to them.

## 2022-07-30 ENCOUNTER — Encounter: Payer: Self-pay | Admitting: Internal Medicine

## 2022-08-17 ENCOUNTER — Ambulatory Visit: Payer: BC Managed Care – PPO | Admitting: Internal Medicine

## 2022-08-29 ENCOUNTER — Other Ambulatory Visit: Payer: BC Managed Care – PPO

## 2022-08-30 ENCOUNTER — Ambulatory Visit
Admission: RE | Admit: 2022-08-30 | Discharge: 2022-08-30 | Disposition: A | Discharge status: Home / Self Care | Payer: BC Managed Care – PPO | Source: Ambulatory Visit | Attending: Internal Medicine | Admitting: Internal Medicine

## 2022-08-30 DIAGNOSIS — R7989 Other specified abnormal findings of blood chemistry: Secondary | ICD-10-CM

## 2022-08-30 DIAGNOSIS — K8020 Calculus of gallbladder without cholecystitis without obstruction: Secondary | ICD-10-CM | POA: Diagnosis not present

## 2022-08-30 DIAGNOSIS — R634 Abnormal weight loss: Secondary | ICD-10-CM

## 2022-08-30 DIAGNOSIS — K760 Fatty (change of) liver, not elsewhere classified: Secondary | ICD-10-CM | POA: Diagnosis not present

## 2022-08-30 MED ORDER — GADOPICLENOL 0.5 MMOL/ML IV SOLN
10.0000 mL | Freq: Once | INTRAVENOUS | Status: AC | PRN
  Administered 2022-08-30: 10 mL via INTRAVENOUS

## 2022-08-31 ENCOUNTER — Other Ambulatory Visit (INDEPENDENT_AMBULATORY_CARE_PROVIDER_SITE_OTHER): Payer: BC Managed Care – PPO

## 2022-08-31 DIAGNOSIS — R7989 Other specified abnormal findings of blood chemistry: Secondary | ICD-10-CM | POA: Diagnosis not present

## 2022-08-31 LAB — PROTIME-INR
INR: 1.1 ratio — ABNORMAL HIGH (ref 0.8–1.0)
Prothrombin Time: 11.7 s (ref 9.6–13.1)

## 2022-09-01 LAB — MITOCHONDRIAL/SMOOTH MUSCLE AB PNL
Mitochondrial Ab: <20 Units (ref 0.0–20.0)
Smooth Muscle Ab: 4 Units (ref 0–19)

## 2022-09-04 ENCOUNTER — Other Ambulatory Visit: Payer: Self-pay | Admitting: Internal Medicine

## 2022-09-04 DIAGNOSIS — R768 Other specified abnormal immunological findings in serum: Secondary | ICD-10-CM | POA: Insufficient documentation

## 2022-09-04 DIAGNOSIS — R7689 Other specified abnormal immunological findings in serum: Secondary | ICD-10-CM | POA: Insufficient documentation

## 2022-09-04 LAB — HEPATITIS PANEL, ACUTE
Hep A IgM: BORDERLINE — AB
Hep B C IgM: NONREACTIVE
Hepatitis B Surface Ag: NONREACTIVE
Hepatitis C Ab: NONREACTIVE

## 2022-09-04 LAB — ANTI-SMOOTH MUSCLE ANTIBODY, IGG: Actin (Smooth Muscle) Antibody (IGG): <20 U (ref <20)

## 2022-09-04 LAB — ANTI-NUCLEAR AB-TITER (ANA TITER): ANA Titer 1: 1:40 {titer} — ABNORMAL HIGH

## 2022-09-04 LAB — ANTI-MICROSOMAL ANTIBODY LIVER / KIDNEY: LKM1 Ab: <=20 U (ref <=20.0)

## 2022-09-04 LAB — AFP TUMOR MARKER: AFP-Tumor Marker: 5.8 ng/mL

## 2022-09-04 LAB — ANA: Anti Nuclear Antibody (ANA): POSITIVE — AB

## 2022-09-22 DIAGNOSIS — F339 Major depressive disorder, recurrent, unspecified: Secondary | ICD-10-CM | POA: Diagnosis not present

## 2022-09-22 DIAGNOSIS — E669 Obesity, unspecified: Secondary | ICD-10-CM | POA: Diagnosis not present

## 2022-09-22 DIAGNOSIS — G4700 Insomnia, unspecified: Secondary | ICD-10-CM | POA: Diagnosis not present

## 2022-09-22 DIAGNOSIS — L2089 Other atopic dermatitis: Secondary | ICD-10-CM | POA: Diagnosis not present

## 2022-09-22 DIAGNOSIS — L400 Psoriasis vulgaris: Secondary | ICD-10-CM | POA: Diagnosis not present

## 2022-09-22 DIAGNOSIS — R21 Rash and other nonspecific skin eruption: Secondary | ICD-10-CM | POA: Diagnosis not present

## 2022-09-22 DIAGNOSIS — F411 Generalized anxiety disorder: Secondary | ICD-10-CM | POA: Diagnosis not present

## 2022-09-22 DIAGNOSIS — Z79899 Other long term (current) drug therapy: Secondary | ICD-10-CM | POA: Diagnosis not present

## 2022-09-22 DIAGNOSIS — L309 Dermatitis, unspecified: Secondary | ICD-10-CM | POA: Diagnosis not present

## 2022-10-05 DIAGNOSIS — Z79899 Other long term (current) drug therapy: Secondary | ICD-10-CM | POA: Diagnosis not present

## 2022-10-15 DIAGNOSIS — L400 Psoriasis vulgaris: Secondary | ICD-10-CM | POA: Diagnosis not present

## 2022-10-15 DIAGNOSIS — Z79899 Other long term (current) drug therapy: Secondary | ICD-10-CM | POA: Diagnosis not present

## 2022-10-20 ENCOUNTER — Other Ambulatory Visit: Payer: Self-pay | Admitting: Internal Medicine

## 2022-10-20 DIAGNOSIS — E876 Hypokalemia: Secondary | ICD-10-CM

## 2022-11-12 DIAGNOSIS — F339 Major depressive disorder, recurrent, unspecified: Secondary | ICD-10-CM | POA: Diagnosis not present

## 2022-11-12 DIAGNOSIS — G4700 Insomnia, unspecified: Secondary | ICD-10-CM | POA: Diagnosis not present

## 2022-11-12 DIAGNOSIS — E669 Obesity, unspecified: Secondary | ICD-10-CM | POA: Diagnosis not present

## 2022-11-12 DIAGNOSIS — F411 Generalized anxiety disorder: Secondary | ICD-10-CM | POA: Diagnosis not present

## 2022-12-30 DIAGNOSIS — G4700 Insomnia, unspecified: Secondary | ICD-10-CM | POA: Diagnosis not present

## 2022-12-30 DIAGNOSIS — E669 Obesity, unspecified: Secondary | ICD-10-CM | POA: Diagnosis not present

## 2022-12-30 DIAGNOSIS — F339 Major depressive disorder, recurrent, unspecified: Secondary | ICD-10-CM | POA: Diagnosis not present

## 2022-12-30 DIAGNOSIS — F411 Generalized anxiety disorder: Secondary | ICD-10-CM | POA: Diagnosis not present

## 2023-01-16 ENCOUNTER — Other Ambulatory Visit: Payer: Self-pay | Admitting: Internal Medicine

## 2023-01-16 DIAGNOSIS — E876 Hypokalemia: Secondary | ICD-10-CM

## 2023-02-02 ENCOUNTER — Ambulatory Visit: Payer: BC Managed Care – PPO | Admitting: Internal Medicine

## 2023-02-02 ENCOUNTER — Encounter: Payer: Self-pay | Admitting: Internal Medicine

## 2023-02-02 VITALS — BP 148/94 | HR 99 | Temp 99.0°F | Resp 16 | Ht 69.0 in | Wt 288.4 lb

## 2023-02-02 DIAGNOSIS — R7989 Other specified abnormal findings of blood chemistry: Secondary | ICD-10-CM

## 2023-02-02 DIAGNOSIS — G629 Polyneuropathy, unspecified: Secondary | ICD-10-CM | POA: Insufficient documentation

## 2023-02-02 DIAGNOSIS — G63 Polyneuropathy in diseases classified elsewhere: Secondary | ICD-10-CM

## 2023-02-02 DIAGNOSIS — E5111 Dry beriberi: Secondary | ICD-10-CM | POA: Diagnosis not present

## 2023-02-02 DIAGNOSIS — I10 Essential (primary) hypertension: Secondary | ICD-10-CM | POA: Diagnosis not present

## 2023-02-02 DIAGNOSIS — E876 Hypokalemia: Secondary | ICD-10-CM

## 2023-02-02 DIAGNOSIS — E60 Dietary zinc deficiency: Secondary | ICD-10-CM

## 2023-02-02 DIAGNOSIS — D520 Dietary folate deficiency anemia: Secondary | ICD-10-CM | POA: Diagnosis not present

## 2023-02-02 DIAGNOSIS — Z9884 Bariatric surgery status: Secondary | ICD-10-CM

## 2023-02-02 DIAGNOSIS — N926 Irregular menstruation, unspecified: Secondary | ICD-10-CM | POA: Diagnosis not present

## 2023-02-02 DIAGNOSIS — E559 Vitamin D deficiency, unspecified: Secondary | ICD-10-CM

## 2023-02-02 DIAGNOSIS — D696 Thrombocytopenia, unspecified: Secondary | ICD-10-CM | POA: Diagnosis not present

## 2023-02-02 DIAGNOSIS — E538 Deficiency of other specified B group vitamins: Secondary | ICD-10-CM

## 2023-02-02 DIAGNOSIS — D508 Other iron deficiency anemias: Secondary | ICD-10-CM | POA: Diagnosis not present

## 2023-02-02 DIAGNOSIS — K7010 Alcoholic hepatitis without ascites: Secondary | ICD-10-CM

## 2023-02-02 DIAGNOSIS — Z30019 Encounter for initial prescription of contraceptives, unspecified: Secondary | ICD-10-CM

## 2023-02-02 DIAGNOSIS — E519 Thiamine deficiency, unspecified: Secondary | ICD-10-CM

## 2023-02-02 DIAGNOSIS — Z124 Encounter for screening for malignant neoplasm of cervix: Secondary | ICD-10-CM

## 2023-02-02 NOTE — Patient Instructions (Signed)
Neuropathic Pain Neuropathic pain is pain caused by damage to the nerves that are responsible for certain sensations in your body (sensory nerves). Neuropathic pain can make you more sensitive to pain. Even a minor sensation can feel very painful. This is usually a long-term (chronic) condition that can be difficult to treat. The type of pain differs from person to person. It may: Start suddenly (acute), or it may develop slowly and become chronic. Come and go as damaged nerves heal, or it may stay at the same level for years. Cause emotional distress, loss of sleep, and a lower quality of life. What are the causes? The most common cause of this condition is diabetes. Many other diseases and conditions can also cause neuropathic pain. Causes of neuropathic pain can be classified as: Toxic. This is caused by medicines and chemicals. The most common causes of toxic neuropathic pain is damage from medicines that kill cancer cells (chemotherapy) or alcohol abuse. Metabolic. This can be caused by: Diabetes. Lack of vitamins like B12. Traumatic. Any injury that cuts, crushes, or stretches a nerve can cause damage and pain. Compression-related. If a sensory nerve gets trapped or compressed for a long period of time, the blood supply to the nerve can be cut off. Vascular. Many blood vessel diseases can cause neuropathic pain by decreasing blood supply and oxygen to nerves. Autoimmune. This type of pain results from diseases in which the body's defense system (immune system) mistakenly attacks sensory nerves. Examples of autoimmune diseases that can cause neuropathic pain include lupus and multiple sclerosis. Infectious. Many types of viral infections can damage sensory nerves and cause pain. Shingles infection is a common cause of this type of pain. Inherited. Neuropathic pain can be a symptom of many diseases that are passed down through families (genetic). What increases the risk? You are more likely to  develop this condition if: You have diabetes. You smoke. You drink too much alcohol. You are taking certain medicines, including chemotherapy or medicines that treat immune system disorders. What are the signs or symptoms? The main symptom is pain. Neuropathic pain is often described as: Burning. Shock-like. Stinging. Hot or cold. Itching. How is this diagnosed? No single test can diagnose neuropathic pain. It is diagnosed based on: A physical exam and your symptoms. Your health care provider will ask you about your pain. You may be asked to use a pain scale to describe how bad your pain is. Tests. These may be done to see if you have a cause and location of any nerve damage. They include: Nerve conduction studies and electromyography to test how well nerve signals travel through your nerves and muscles (electrodiagnostic testing). Skin biopsy to evaluate for small fiber neuropathy. Imaging studies, such as: X-rays. CT scan. MRI. How is this treated? Treatment for neuropathic pain may change over time. You may need to try different treatment options or a combination of treatments. Some options include: Treating the underlying cause of the neuropathy, such as diabetes, kidney disease, or vitamin deficiencies. Stopping medicines that can cause neuropathy, such as chemotherapy. Medicine to relieve pain. Medicines may include: Prescription or over-the-counter pain medicine. Anti-seizure medicine. Antidepressant medicines. Pain-relieving patches or creams that are applied to painful areas of skin. A medicine to numb the area (local anesthetic), which can be injected as a nerve block. Transcutaneous nerve stimulation. This uses electrical currents to block painful nerve signals. The treatment is painless. Alternative treatments, such as: Acupuncture. Meditation. Massage. Occupational or physical therapy. Pain management programs. Counseling. Follow   these instructions at  home: Medicines  Take over-the-counter and prescription medicines only as told by your health care provider. Ask your health care provider if the medicine prescribed to you: Requires you to avoid driving or using machinery. Can cause constipation. You may need to take these actions to prevent or treat constipation: Drink enough fluid to keep your urine pale yellow. Take over-the-counter or prescription medicines. Eat foods that are high in fiber, such as beans, whole grains, and fresh fruits and vegetables. Limit foods that are high in fat and processed sugars, such as fried or sweet foods. Lifestyle  Have a good support system at home. Consider joining a chronic pain support group. Do not use any products that contain nicotine or tobacco. These products include cigarettes, chewing tobacco, and vaping devices, such as e-cigarettes. If you need help quitting, ask your health care provider. Do not drink alcohol. General instructions Learn as much as you can about your condition. Work closely with all your health care providers to find the treatment plan that works best for you. Ask your health care provider what activities are safe for you. Keep all follow-up visits. This is important. Contact a health care provider if: Your pain treatments are not working. You are having side effects from your medicines. You are struggling with tiredness (fatigue), mood changes, depression, or anxiety. Get help right away if: You have thoughts of hurting yourself. Get help right away if you feel like you may hurt yourself or others, or have thoughts about taking your own life. Go to your nearest emergency room or: Call 911. Call the National Suicide Prevention Lifeline at 1-800-273-8255 or 988. This is open 24 hours a day. Text the Crisis Text Line at 741741. Summary Neuropathic pain is pain caused by damage to the nerves that are responsible for certain sensations in your body (sensory  nerves). Neuropathic pain may come and go as damaged nerves heal, or it may stay at the same level for years. Neuropathic pain is usually a long-term condition that can be difficult to treat. Consider joining a chronic pain support group. This information is not intended to replace advice given to you by your health care provider. Make sure you discuss any questions you have with your health care provider. Document Revised: 02/17/2021 Document Reviewed: 02/17/2021 Elsevier Patient Education  2024 [employer]vier Inc.  

## 2023-02-02 NOTE — Progress Notes (Signed)
Subjective:  Patient ID: [name], female    DOB: 11/26/1985  Age: 37 y.o. MRN: [id number]  CC: Hypertension and Anemia   HPI [name] presents for f/up ---  Discussed the use of AI scribe software for clinical note transcription with the patient, who gave verbal consent to proceed.  History of Present Illness   The patient, with a history of alcohol use, presents with bilateral foot tingling and burning sensation, which she noticed after a three-week vacation during which she consumed alcohol heavily. The symptoms began as a mild tingling sensation, which she initially noticed only when consuming alcohol. However, the symptoms have since progressed to a severe burning sensation and pain, particularly at night, causing significant sleep disturbance. The patient reports that her feet also become swollen, making it difficult to walk.  The patient has attempted to manage the pain with ibuprofen and BC, but these have provided minimal relief. She has not experienced any chest pain, shortness of breath, dizziness, lightheadedness, hallucinations, or delusions. She denies any withdrawal symptoms such as insomnia, anxiety, or palpitations, apart from the sleep disturbance caused by the foot pain.  The patient's last alcohol consumption was a week ago, and she has since decided to quit drinking. Her last menstrual cycle was at the beginning of June, and she acknowledges the possibility of being pregnant.       Outpatient Medications Prior to Visit  Medication Sig Dispense Refill   escitalopram (LEXAPRO) 10 MG tablet Take 1 tablet (10 mg total) by mouth daily. 90 tablet 1   folic acid (FOLVITE) 1 MG tablet Take 1 tablet (1 mg total) by mouth daily. 90 tablet 1   levalbuterol (XOPENEX HFA) 45 MCG/ACT inhaler INHALE 2 PUFFS INTO THE LUNGS EVERY 6 HOURS AS NEEDED FOR WHEEZE 15 each 3   thiamine (VITAMIN B-1) 50 MG tablet Take 1 tablet (50 mg total) by mouth daily. 90 tablet 1    zinc gluconate 50 MG tablet Take 1 tablet (50 mg total) by mouth daily. 90 tablet 1   nebivolol (BYSTOLIC) 10 MG tablet TAKE 1 TABLET BY MOUTH EVERY DAY 90 tablet 0   triamterene-hydrochlorothiazide (DYAZIDE) 37.5-25 MG capsule TAKE 1 CAPSULE BY MOUTH EVERY DAY 90 capsule 0   valsartan (DIOVAN) 160 MG tablet TAKE 1 TABLET BY MOUTH EVERY DAY 90 tablet 0   Vitamin D, Ergocalciferol, (DRISDOL) 1.25 MG (50000 UNIT) CAPS capsule Take 1 capsule (50,000 Units total) by mouth every 7 (seven) days. 12 capsule 0   Tralokinumab-ldrm (ADBRY) 150 MG/ML SOSY Inject into the skin.     KLOR-CON M10 10 MEQ tablet TAKE 1 TABLET BY MOUTH 2 TIMES DAILY. 180 tablet 0   No facility-administered medications prior to visit.    ROS Review of Systems  Constitutional:  Positive for fatigue and unexpected weight change (wt loss). Negative for appetite change, chills and diaphoresis.  HENT: Negative.    Eyes: Negative.   Respiratory:  Negative for cough, chest tightness, shortness of breath and wheezing.   Cardiovascular:  Negative for chest pain, palpitations and leg swelling.  Gastrointestinal: Negative.  Negative for abdominal pain, constipation, diarrhea, nausea and vomiting.  Endocrine: Negative.   Genitourinary: Negative.  Negative for difficulty urinating.  Musculoskeletal:  Negative for arthralgias.  Skin: Negative.  Negative for rash.  Allergic/Immunologic: Negative.   Neurological: Negative.  Negative for dizziness.  Hematological:  Negative for adenopathy. Does not bruise/bleed easily.  Psychiatric/Behavioral: Negative.      Objective:  BP Marland Kitchen)  148/94 (BP Location: Left Arm, Patient Position: Sitting, Cuff Size: Normal)   Pulse 99   Temp 99 F (37.2 C) (Oral)   Resp 16   Ht 5\' 9"  (1.753 m)   Wt 288 lb 6.4 oz (130.8 kg)   LMP 12/07/2022 (Approximate)   SpO2 98%   BMI 42.59 kg/m   BP Readings from Last 3 Encounters:  02/02/23 (!) 148/94  07/21/22 124/76  07/16/22 (!) 132/90    Wt  Readings from Last 3 Encounters:  02/02/23 288 lb 6.4 oz (130.8 kg)  07/21/22 (!) 320 lb (145.2 kg)  07/16/22 (!) 321 lb (145.6 kg)    Physical Exam Vitals reviewed.  Constitutional:      Appearance: She is not ill-appearing.  HENT:     Nose: Nose normal.     Mouth/Throat:     Mouth: Mucous membranes are moist.  Eyes:     General: No visual field deficit or scleral icterus.    Conjunctiva/sclera: Conjunctivae normal.  Cardiovascular:     Rate and Rhythm: Normal rate and regular rhythm.     Pulses:          Dorsalis pedis pulses are 1+ on the right side and 1+ on the left side.       Posterior tibial pulses are 1+ on the right side and 1+ on the left side.     Heart sounds: No murmur heard. Pulmonary:     Effort: Pulmonary effort is normal.     Breath sounds: No stridor. No wheezing, rhonchi or rales.  Abdominal:     General: Abdomen is flat.     Palpations: There is no mass.     Tenderness: There is no abdominal tenderness. There is no guarding.     Hernia: No hernia is present.  Musculoskeletal:        General: Normal range of motion.     Cervical back: Neck supple.     Right lower leg: No edema.     Left lower leg: No edema.  Feet:     Right foot:     Skin integrity: Skin integrity normal.     Toenail Condition: Right toenails are normal.     Left foot:     Skin integrity: Skin integrity normal.     Toenail Condition: Left toenails are normal.  Lymphadenopathy:     Cervical: No cervical adenopathy.  Skin:    General: Skin is warm and dry.  Neurological:     General: No focal deficit present.     Mental Status: She is alert.     Cranial Nerves: Cranial nerves 2-12 are intact. No cranial nerve deficit, dysarthria or facial asymmetry.     Sensory: Sensation is intact. No sensory deficit.     Motor: Motor function is intact. No weakness.     Coordination: Coordination is intact.     Gait: Gait is intact. Gait normal.     Deep Tendon Reflexes: Reflexes normal.      Reflex Scores:      Tricep reflexes are 1+ on the right side and 1+ on the left side.      Bicep reflexes are 1+ on the right side and 1+ on the left side.      Brachioradialis reflexes are 1+ on the right side and 1+ on the left side.      Patellar reflexes are 0 on the right side and 0 on the left side.      Achilles reflexes are  0 on the right side and 0 on the left side. Psychiatric:        Mood and Affect: Mood normal.        Behavior: Behavior normal.     Lab Results  Component Value Date   WBC 7.0 02/02/2023   HGB 11.6 (L) 02/02/2023   HCT 37.4 02/02/2023   PLT 237.0 02/02/2023   GLUCOSE 90 02/02/2023   CHOL 193 02/11/2022   TRIG 134.0 02/11/2022   HDL 82.60 02/11/2022   LDLDIRECT 87.0 10/17/2020   LDLCALC 84 02/11/2022   ALT 66 (H) 02/02/2023   AST 343 (H) 02/02/2023   NA 137 02/02/2023   K 3.6 02/02/2023   CL 99 02/02/2023   CREATININE 0.49 02/02/2023   BUN 7 02/02/2023   CO2 29 02/02/2023   TSH 2.14 02/02/2023   INR 1.2 (H) 02/02/2023   HGBA1C 4.5 (L) 02/02/2023    MR Abdomen W Wo Contrast  Result Date: 09/01/2022 CLINICAL DATA:  Unintentional weight loss. Elevated liver function tests. EXAM: MRI ABDOMEN WITHOUT AND WITH CONTRAST TECHNIQUE: Multiplanar multisequence MR imaging of the abdomen was performed both before and after the administration of intravenous contrast. CONTRAST:  10 mL Vueway COMPARISON:  None Available. FINDINGS: Lower chest: No acute findings. Hepatobiliary: No hepatic masses identified. Moderate to severe diffuse hepatic steatosis is demonstrated. Small gallstones are noted. No evidence of cholecystitis. No evidence of biliary ductal dilatation or choledocholithiasis. Pancreas: No mass or inflammatory changes. No evidence of pancreatic ductal dilatation. Spleen:  Within normal limits in size and appearance. Adrenals/Urinary Tract: No suspicious masses identified. No evidence of hydronephrosis. Stomach/Bowel: Unremarkable. Vascular/Lymphatic: No  pathologically enlarged lymph nodes identified. No acute vascular findings. Other:  None. Musculoskeletal:  No suspicious bone lesions identified. IMPRESSION: Moderate to severe hepatic steatosis. Cholelithiasis. No radiographic evidence of cholecystitis, biliary ductal dilatation, or choledocholithiasis. Electronically Signed   By: Danae Orleans M.D.   On: 09/01/2022 08:04    Assessment & Plan:   B12 deficiency -     CBC with Differential/Platelet; Future  Chronic zinc deficiency -     CBC with Differential/Platelet; Future  Dietary folate deficiency anemia -     Vitamin B1; Future -     Zinc; Future -     CBC with Differential/Platelet; Future  Elevated LFTs -     Hepatic function panel; Future -     Protime-INR; Future  Iron deficiency anemia secondary to inadequate dietary iron intake -     IBC + Ferritin; Future -     CBC with Differential/Platelet; Future -     ACCRUFeR; Take 1 capsule (30 mg total) by mouth in the morning and at bedtime.  Dispense: 180 capsule; Refill: 1  Hypokalemia -     Basic metabolic panel; Future -     Hepatic function panel; Future -     Magnesium; Future -     Potassium Chloride Crys ER; Take 1 tablet (10 mEq total) by mouth 2 (two) times daily.  Dispense: 180 tablet; Refill: 0  Primary hypertension- Her blood pressure is not at goal.  Will restart the antihypertensives. -     CBC with Differential/Platelet; Future -     Basic metabolic panel; Future -     Nebivolol HCl; Take 1 tablet (10 mg total) by mouth daily.  Dispense: 90 tablet; Refill: 0 -     Triamterene-HCTZ; Take 1 each (1 capsule total) by mouth daily.  Dispense: 90 capsule; Refill: 0 -  Potassium Chloride Crys ER; Take 1 tablet (10 mEq total) by mouth 2 (two) times daily.  Dispense: 180 tablet; Refill: 0  Thiamine deficiency neuropathy -     Vitamin B1; Future -     Basic metabolic panel; Future  Thrombocytopenia (HCC)- Platelets are normal now. -     Basic metabolic panel;  Future  Neuropathy -     TSH; Future -     Vitamin B6; Future -     Hemoglobin A1c; Future -     Pregabalin; Take 1 capsule (25 mg total) by mouth 3 (three) times daily.  Dispense: 90 capsule; Refill: 0 -     Ambulatory referral to Neurology -     traMADol HCl ER; Take 1 tablet (100 mg total) by mouth daily as needed for pain.  Dispense: 30 tablet; Refill: 0  Vitamin D deficiency -     VITAMIN D 25 Hydroxy (Vit-D Deficiency, Fractures); Future -     Vitamin D (Ergocalciferol); Take 1 capsule (50,000 Units total) by mouth every 7 (seven) days.  Dispense: 12 capsule; Refill: 0  History of Roux-en-Y gastric bypass -     VITAMIN D 25 Hydroxy (Vit-D Deficiency, Fractures); Future -     Folate; Future -     Vitamin B12; Future -     Vitamin D (Ergocalciferol); Take 1 capsule (50,000 Units total) by mouth every 7 (seven) days.  Dispense: 12 capsule; Refill: 0 -     ACCRUFeR; Take 1 capsule (30 mg total) by mouth in the morning and at bedtime.  Dispense: 180 capsule; Refill: 1  Missed menses- Beta-hCG is negative. -     hCG, quantitative, pregnancy; Future  Screening for cervical cancer -     Ambulatory referral to Gynecology  Acute alcoholic hepatitis- There are no signs of DTs.  She agrees to abstain from alcohol intake.  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     Follow-up: Return in about 4 weeks (around 03/02/2023).  Sanda Linger, MD

## 2023-02-03 ENCOUNTER — Encounter: Payer: Self-pay | Admitting: Internal Medicine

## 2023-02-03 DIAGNOSIS — Z124 Encounter for screening for malignant neoplasm of cervix: Secondary | ICD-10-CM | POA: Insufficient documentation

## 2023-02-03 DIAGNOSIS — K7010 Alcoholic hepatitis without ascites: Secondary | ICD-10-CM | POA: Insufficient documentation

## 2023-02-03 DIAGNOSIS — N926 Irregular menstruation, unspecified: Secondary | ICD-10-CM | POA: Insufficient documentation

## 2023-02-03 DIAGNOSIS — Z30019 Encounter for initial prescription of contraceptives, unspecified: Secondary | ICD-10-CM | POA: Insufficient documentation

## 2023-02-03 MED ORDER — PREGABALIN 25 MG PO CAPS: 25.0000 mg | ORAL_CAPSULE | Freq: Three times a day (TID) | ORAL | 0 refills | Status: DC

## 2023-02-03 MED ORDER — TRIAMTERENE-HCTZ 37.5-25 MG PO CAPS
1.0000 | ORAL_CAPSULE | Freq: Every day | ORAL | 0 refills | Status: DC
Start: 2023-02-03 — End: 2023-03-30

## 2023-02-03 MED ORDER — ACCRUFER 30 MG PO CAPS: 1.0000 | ORAL_CAPSULE | Freq: Two times a day (BID) | ORAL | 1 refills | Status: DC

## 2023-02-03 MED ORDER — NEBIVOLOL HCL 10 MG PO TABS
10.0000 mg | ORAL_TABLET | Freq: Every day | ORAL | 0 refills | Status: DC
Start: 2023-02-03 — End: 2023-07-05

## 2023-02-03 MED ORDER — POTASSIUM CHLORIDE CRYS ER 10 MEQ PO TBCR: 10.0000 meq | EXTENDED_RELEASE_TABLET | Freq: Two times a day (BID) | ORAL | 0 refills | Status: DC

## 2023-02-03 MED ORDER — VITAMIN D (ERGOCALCIFEROL) 1.25 MG (50000 UNIT) PO CAPS
50000.0000 [IU] | ORAL_CAPSULE | ORAL | 0 refills | Status: DC
Start: 2023-02-03 — End: 2023-09-21

## 2023-02-04 ENCOUNTER — Telehealth: Payer: Self-pay

## 2023-02-04 ENCOUNTER — Other Ambulatory Visit (HOSPITAL_COMMUNITY): Payer: Self-pay

## 2023-02-04 MED ORDER — TRAMADOL HCL ER 100 MG PO TB24
100.0000 mg | ORAL_TABLET | Freq: Every day | ORAL | 0 refills | Status: DC | PRN
Start: 2023-02-04 — End: 2023-02-08

## 2023-02-04 NOTE — Telephone Encounter (Signed)
Pharmacy Patient Advocate Encounter  Received notification from [facility] that Prior Authorization for traMADol HCl ER 100MG  er tablets has been APPROVED from 02/04/23 to 08/03/23. Ran test claim, Copay is $0  PA #/Case ID/Reference #: [id number]

## 2023-02-04 NOTE — Telephone Encounter (Signed)
Patient called and stated that the long acting tramadol needs a prior authorization but if the prescription can be changed to regular that it won't need a prior authorization - if you can do this please [facility] to [facility] [address] in [place], [place]

## 2023-02-05 ENCOUNTER — Encounter: Payer: Self-pay | Admitting: Neurology

## 2023-02-05 DIAGNOSIS — G63 Polyneuropathy in diseases classified elsewhere: Secondary | ICD-10-CM | POA: Insufficient documentation

## 2023-02-08 ENCOUNTER — Other Ambulatory Visit: Payer: Self-pay | Admitting: Internal Medicine

## 2023-02-08 ENCOUNTER — Other Ambulatory Visit (HOSPITAL_COMMUNITY): Payer: Self-pay

## 2023-02-08 ENCOUNTER — Telehealth: Payer: Self-pay

## 2023-02-08 DIAGNOSIS — G629 Polyneuropathy, unspecified: Secondary | ICD-10-CM

## 2023-02-08 MED ORDER — TRAMADOL HCL ER 100 MG PO TB24
100.0000 mg | ORAL_TABLET | Freq: Every day | ORAL | 0 refills | Status: DC | PRN
Start: 2023-02-08 — End: 2023-02-13

## 2023-02-08 NOTE — Telephone Encounter (Signed)
Pharmacy Patient Advocate Encounter   Received notification from CoverMyMeds that prior authorization for Accrufer 30mg  is required/requested.   Insurance verification completed.   The patient is insured through [facility] .   Per test claim: PA required; PA submitted to [facility] via CoverMyMeds Key/confirmation #/EOC [id number] Status is pending

## 2023-02-09 ENCOUNTER — Other Ambulatory Visit (HOSPITAL_COMMUNITY): Payer: Self-pay

## 2023-02-09 NOTE — Telephone Encounter (Signed)
Pharmacy Patient Advocate Encounter  Received notification from [facility] that Prior Authorization for Accrufer 30mg  caps has been APPROVED from 02/08/23 to 02/07/24   PA #/Case ID/Reference #: [id number]

## 2023-02-11 ENCOUNTER — Encounter: Payer: Self-pay | Admitting: Internal Medicine

## 2023-02-12 ENCOUNTER — Ambulatory Visit: Payer: BC Managed Care – PPO | Admitting: Internal Medicine

## 2023-02-12 ENCOUNTER — Encounter: Payer: Self-pay | Admitting: Internal Medicine

## 2023-02-12 VITALS — BP 150/102 | HR 70 | Temp 97.9°F | Resp 16 | Ht 69.0 in | Wt 288.0 lb

## 2023-02-12 DIAGNOSIS — M79604 Pain in right leg: Secondary | ICD-10-CM | POA: Diagnosis not present

## 2023-02-12 DIAGNOSIS — M79605 Pain in left leg: Secondary | ICD-10-CM | POA: Diagnosis not present

## 2023-02-12 DIAGNOSIS — I10 Essential (primary) hypertension: Secondary | ICD-10-CM | POA: Diagnosis not present

## 2023-02-12 DIAGNOSIS — R600 Localized edema: Secondary | ICD-10-CM | POA: Insufficient documentation

## 2023-02-12 DIAGNOSIS — N926 Irregular menstruation, unspecified: Secondary | ICD-10-CM

## 2023-02-12 DIAGNOSIS — I110 Hypertensive heart disease with heart failure: Secondary | ICD-10-CM

## 2023-02-12 DIAGNOSIS — I739 Peripheral vascular disease, unspecified: Secondary | ICD-10-CM

## 2023-02-12 DIAGNOSIS — H18622 Keratoconus, unstable, left eye: Secondary | ICD-10-CM | POA: Diagnosis not present

## 2023-02-12 DIAGNOSIS — G629 Polyneuropathy, unspecified: Secondary | ICD-10-CM

## 2023-02-12 DIAGNOSIS — R748 Abnormal levels of other serum enzymes: Secondary | ICD-10-CM

## 2023-02-12 DIAGNOSIS — K7010 Alcoholic hepatitis without ascites: Secondary | ICD-10-CM

## 2023-02-12 DIAGNOSIS — R7989 Other specified abnormal findings of blood chemistry: Secondary | ICD-10-CM

## 2023-02-12 LAB — URINALYSIS, ROUTINE W REFLEX MICROSCOPIC
Hgb urine dipstick: NEGATIVE
Ketones, ur: NEGATIVE
Nitrite: POSITIVE — AB
RBC / HPF: NONE SEEN (ref 0–?)
Specific Gravity, Urine: 1.015 (ref 1.000–1.030)
Total Protein, Urine: NEGATIVE
Urine Glucose: NEGATIVE
Urobilinogen, UA: 2 — AB (ref 0.0–1.0)
pH: 6 (ref 5.0–8.0)

## 2023-02-12 LAB — HEPATIC FUNCTION PANEL
ALT: 63 U/L — ABNORMAL HIGH (ref 0–35)
AST: 303 U/L — ABNORMAL HIGH (ref 0–37)
Albumin: 3.9 g/dL (ref 3.5–5.2)
Alkaline Phosphatase: 130 U/L — ABNORMAL HIGH (ref 39–117)
Bilirubin, Direct: 0.3 mg/dL (ref 0.0–0.3)
Total Bilirubin: 0.7 mg/dL (ref 0.2–1.2)
Total Protein: 7.8 g/dL (ref 6.0–8.3)

## 2023-02-12 LAB — D-DIMER, QUANTITATIVE: D-Dimer, Quant: 0.4 mcg/mL FEU (ref <0.50)

## 2023-02-12 LAB — TROPONIN I (HIGH SENSITIVITY): High Sens Troponin I: 14 ng/L (ref 2–17)

## 2023-02-12 LAB — BRAIN NATRIURETIC PEPTIDE: Pro B Natriuretic peptide (BNP): 75 pg/mL (ref 0.0–100.0)

## 2023-02-12 LAB — HCG, QUANTITATIVE, PREGNANCY: Quantitative HCG: 2.23 m[IU]/mL

## 2023-02-12 NOTE — Progress Notes (Unsigned)
Subjective:  Patient ID: [name], female    DOB: 02-Oct-1985  Age: 37 y.o. MRN: [id number]  CC: Hypertension and Anemia   HPI [name] presents for f/up ---  Outpatient Medications Prior to Visit  Medication Sig Dispense Refill   escitalopram (LEXAPRO) 10 MG tablet Take 1 tablet (10 mg total) by mouth daily. 90 tablet 1   Ferric Maltol (ACCRUFER) 30 MG CAPS Take 1 capsule (30 mg total) by mouth in the morning and at bedtime. 180 capsule 1   folic acid (FOLVITE) 1 MG tablet Take 1 tablet (1 mg total) by mouth daily. 90 tablet 1   levalbuterol (XOPENEX HFA) 45 MCG/ACT inhaler INHALE 2 PUFFS INTO THE LUNGS EVERY 6 HOURS AS NEEDED FOR WHEEZE 15 each 3   nebivolol (BYSTOLIC) 10 MG tablet Take 1 tablet (10 mg total) by mouth daily. 90 tablet 0   potassium chloride (KLOR-CON M10) 10 MEQ tablet Take 1 tablet (10 mEq total) by mouth 2 (two) times daily. 180 tablet 0   traMADol (ULTRAM-ER) 100 MG 24 hr tablet Take 1 tablet (100 mg total) by mouth daily as needed for pain. 30 tablet 0   triamterene-hydrochlorothiazide (DYAZIDE) 37.5-25 MG capsule Take 1 each (1 capsule total) by mouth daily. 90 capsule 0   Vitamin D, Ergocalciferol, (DRISDOL) 1.25 MG (50000 UNIT) CAPS capsule Take 1 capsule (50,000 Units total) by mouth every 7 (seven) days. 12 capsule 0   zinc gluconate 50 MG tablet Take 1 tablet (50 mg total) by mouth daily. 90 tablet 1   pregabalin (LYRICA) 25 MG capsule Take 1 capsule (25 mg total) by mouth 3 (three) times daily. 90 capsule 0   thiamine (VITAMIN B-1) 50 MG tablet Take 1 tablet (50 mg total) by mouth daily. (Patient not taking: Reported on 02/12/2023) 90 tablet 1   No facility-administered medications prior to visit.    ROS Review of Systems  Constitutional: Negative.  Negative for appetite change, diaphoresis, fatigue and unexpected weight change.  HENT: Negative.    Eyes: Negative.  Negative for visual disturbance.  Respiratory:  Negative  for cough, chest tightness, shortness of breath and wheezing.   Cardiovascular:  Positive for leg swelling. Negative for chest pain and palpitations.  Gastrointestinal:  Negative for abdominal pain.  Musculoskeletal:  Negative for arthralgias.  Skin: Negative.   Neurological:  Positive for numbness. Negative for dizziness and weakness.       Bilateral foot pain and swelling with N/W/T  Hematological:  Negative for adenopathy. Does not bruise/bleed easily.  Psychiatric/Behavioral: Negative.      Objective:  BP (!) 150/102   Pulse 70   Temp 97.9 F (36.6 C) (Oral)   Resp 16   Ht 5\' 9"  (1.753 m)   Wt 288 lb (130.6 kg)   LMP 12/07/2022 (Approximate)   SpO2 99%   BMI 42.53 kg/m   BP Readings from Last 3 Encounters:  02/12/23 (!) 150/102  02/02/23 (!) 148/94  07/21/22 124/76    Wt Readings from Last 3 Encounters:  02/12/23 288 lb (130.6 kg)  02/02/23 288 lb 6.4 oz (130.8 kg)  07/21/22 (!) 320 lb (145.2 kg)    Physical Exam Cardiovascular:     Rate and Rhythm: Normal rate and regular rhythm.     Pulses:          Dorsalis pedis pulses are 0 on the right side and 0 on the left side.  Posterior tibial pulses are 0 on the right side and 0 on the left side.     Heart sounds: No murmur heard.    No friction rub. No gallop.     Comments: EKG- NSR, 68 bpm ?LAE +LVH No acute ST/T wave changes Musculoskeletal:     Right lower leg: 1+ Pitting Edema present.     Left lower leg: 1+ Pitting Edema present.  Feet:     Right foot:     Skin integrity: Skin integrity normal.     Left foot:     Skin integrity: Skin integrity normal.     Lab Results  Component Value Date   WBC 7.0 02/02/2023   HGB 11.6 (L) 02/02/2023   HCT 37.4 02/02/2023   PLT 237.0 02/02/2023   GLUCOSE 90 02/02/2023   CHOL 193 02/11/2022   TRIG 134.0 02/11/2022   HDL 82.60 02/11/2022   LDLDIRECT 87.0 10/17/2020   LDLCALC 84 02/11/2022   ALT 66 (H) 02/02/2023   AST 343 (H) 02/02/2023   NA 137  02/02/2023   K 3.6 02/02/2023   CL 99 02/02/2023   CREATININE 0.49 02/02/2023   BUN 7 02/02/2023   CO2 29 02/02/2023   TSH 2.14 02/02/2023   INR 1.2 (H) 02/02/2023   HGBA1C 4.5 (L) 02/02/2023    MR Abdomen W Wo Contrast  Result Date: 09/01/2022 CLINICAL DATA:  Unintentional weight loss. Elevated liver function tests. EXAM: MRI ABDOMEN WITHOUT AND WITH CONTRAST TECHNIQUE: Multiplanar multisequence MR imaging of the abdomen was performed both before and after the administration of intravenous contrast. CONTRAST:  10 mL Vueway COMPARISON:  None Available. FINDINGS: Lower chest: No acute findings. Hepatobiliary: No hepatic masses identified. Moderate to severe diffuse hepatic steatosis is demonstrated. Small gallstones are noted. No evidence of cholecystitis. No evidence of biliary ductal dilatation or choledocholithiasis. Pancreas: No mass or inflammatory changes. No evidence of pancreatic ductal dilatation. Spleen:  Within normal limits in size and appearance. Adrenals/Urinary Tract: No suspicious masses identified. No evidence of hydronephrosis. Stomach/Bowel: Unremarkable. Vascular/Lymphatic: No pathologically enlarged lymph nodes identified. No acute vascular findings. Other:  None. Musculoskeletal:  No suspicious bone lesions identified. IMPRESSION: Moderate to severe hepatic steatosis. Cholelithiasis. No radiographic evidence of cholecystitis, biliary ductal dilatation, or choledocholithiasis. Electronically Signed   By: Danae Orleans M.D.   On: 09/01/2022 08:04    Assessment & Plan:  Acute alcoholic hepatitis -     Hepatic function panel; Future  Bilateral leg edema -     Urinalysis, Routine w reflex microscopic; Future -     Brain natriuretic peptide; Future -     D-dimer, quantitative; Future -     Troponin I (High Sensitivity); Future -     hCG, quantitative, pregnancy; Future  Pain in both lower extremities -     Brain natriuretic peptide; Future -     D-dimer, quantitative;  Future -     Troponin I (High Sensitivity); Future  Primary hypertension -     Urinalysis, Routine w reflex microscopic; Future -     EKG 12-Lead  LVH (left ventricular hypertrophy) due to hypertensive disease, with heart failure (HCC) -     EKG 12-Lead  Missed menses -     hCG, quantitative, pregnancy; Future  Neuropathy     Follow-up: Return in about 6 weeks (around 03/26/2023).  Sanda Linger, MD

## 2023-02-12 NOTE — Patient Instructions (Signed)
Peripheral Edema  Peripheral edema is swelling that is caused by a buildup of fluid. Peripheral edema most often affects the lower legs, ankles, and feet. It can also develop in the arms, hands, and face. The area of the body that has peripheral edema will look swollen. It may also feel heavy or warm. Your clothes may start to feel tight. Pressing on the area may make a temporary dent in your skin (pitting edema). You may not be able to move your swollen arm or leg as much as usual. There are many causes of peripheral edema. It can happen because of a complication of other conditions such as heart failure, kidney disease, or a problem with your circulation. It also can be a side effect of certain medicines or happen because of an infection. It often happens to women during pregnancy. Sometimes, the cause is not known. Follow these instructions at home: Managing pain, stiffness, and swelling  Raise (elevate) your legs while you are sitting or lying down. Move around often to prevent stiffness and to reduce swelling. Do not sit or stand for long periods of time. Do not wear tight clothing. Do not wear garters on your upper legs. Exercise your legs to get your circulation going. This helps to move the fluid back into your blood vessels, and it may help the swelling go down. Wear compression stockings as told by your health care provider. These stockings help to prevent blood clots and reduce swelling in your legs. It is important that these are the correct size. These stockings should be prescribed by your doctor to prevent possible injuries. If elastic bandages or wraps are recommended, use them as told by your health care provider. Medicines Take over-the-counter and prescription medicines only as told by your health care provider. Your health care provider may prescribe medicine to help your body get rid of excess water (diuretic). Take this medicine if you are told to take it. General  instructions Eat a low-salt (low-sodium) diet as told by your health care provider. Sometimes, eating less salt may reduce swelling. Pay attention to any changes in your symptoms. Moisturize your skin daily to help prevent skin from cracking and draining. Keep all follow-up visits. This is important. Contact a health care provider if: You have a fever. You have swelling in only one leg. You have increased swelling, redness, or pain in one or both of your legs. You have drainage or sores at the area where you have edema. Get help right away if: You have edema that starts suddenly or is getting worse, especially if you are pregnant or have a medical condition. You develop shortness of breath, especially when you are lying down. You have pain in your chest or abdomen. You feel weak. You feel like you will faint. These symptoms may be an emergency. Get help right away. Call 911. Do not wait to see if the symptoms will go away. Do not drive yourself to the hospital. Summary Peripheral edema is swelling that is caused by a buildup of fluid. Peripheral edema most often affects the lower legs, ankles, and feet. Move around often to prevent stiffness and to reduce swelling. Do not sit or stand for long periods of time. Pay attention to any changes in your symptoms. Contact a health care provider if you have edema that starts suddenly or is getting worse, especially if you are pregnant or have a medical condition. Get help right away if you develop shortness of breath, especially when lying down.   This information is not intended to replace advice given to you by your health care provider. Make sure you discuss any questions you have with your health care provider. Document Revised: 02/24/2021 Document Reviewed: 02/24/2021 Elsevier Patient Education  2024 Elsevier Inc.  

## 2023-02-13 DIAGNOSIS — I739 Peripheral vascular disease, unspecified: Secondary | ICD-10-CM | POA: Insufficient documentation

## 2023-02-13 MED ORDER — TORSEMIDE 20 MG PO TABS
20.0000 mg | ORAL_TABLET | Freq: Every day | ORAL | 0 refills | Status: DC
Start: 2023-02-13 — End: 2023-03-30

## 2023-02-13 MED ORDER — TRAMADOL HCL ER 100 MG PO TB24: 200.0000 mg | ORAL_TABLET | Freq: Every day | ORAL | 0 refills | Status: DC | PRN

## 2023-02-21 ENCOUNTER — Other Ambulatory Visit: Payer: Self-pay | Admitting: Internal Medicine

## 2023-02-21 DIAGNOSIS — E538 Deficiency of other specified B group vitamins: Secondary | ICD-10-CM

## 2023-02-21 DIAGNOSIS — G629 Polyneuropathy, unspecified: Secondary | ICD-10-CM

## 2023-02-21 MED ORDER — OXYCODONE HCL 5 MG PO CAPS
5.0000 mg | ORAL_CAPSULE | Freq: Four times a day (QID) | ORAL | 0 refills | Status: DC | PRN
Start: 2023-02-21 — End: 2023-03-01

## 2023-02-23 DIAGNOSIS — F339 Major depressive disorder, recurrent, unspecified: Secondary | ICD-10-CM | POA: Diagnosis not present

## 2023-02-23 DIAGNOSIS — F411 Generalized anxiety disorder: Secondary | ICD-10-CM | POA: Diagnosis not present

## 2023-02-23 DIAGNOSIS — E669 Obesity, unspecified: Secondary | ICD-10-CM | POA: Diagnosis not present

## 2023-02-23 DIAGNOSIS — G4700 Insomnia, unspecified: Secondary | ICD-10-CM | POA: Diagnosis not present

## 2023-03-01 ENCOUNTER — Encounter: Payer: Self-pay | Admitting: Neurology

## 2023-03-01 ENCOUNTER — Other Ambulatory Visit: Payer: Self-pay | Admitting: Neurology

## 2023-03-01 ENCOUNTER — Other Ambulatory Visit: Payer: BC Managed Care – PPO

## 2023-03-01 ENCOUNTER — Ambulatory Visit: Payer: BC Managed Care – PPO | Admitting: Neurology

## 2023-03-01 VITALS — BP 141/97 | HR 91 | Ht 69.0 in | Wt 276.0 lb

## 2023-03-01 DIAGNOSIS — G621 Alcoholic polyneuropathy: Secondary | ICD-10-CM | POA: Diagnosis not present

## 2023-03-01 DIAGNOSIS — M7989 Other specified soft tissue disorders: Secondary | ICD-10-CM

## 2023-03-01 MED ORDER — TRAMADOL HCL ER 100 MG PO TB24
200.0000 mg | ORAL_TABLET | Freq: Every day | ORAL | 0 refills | Status: DC | PRN
Start: 2023-03-01 — End: 2023-03-10

## 2023-03-01 MED ORDER — LIDOCAINE 5 % EX OINT: 1.0000 | TOPICAL_OINTMENT | CUTANEOUS | 1 refills | Status: DC | PRN

## 2023-03-01 NOTE — Telephone Encounter (Signed)
Patient is needing a PA on Lidocaine ointment , [facility] on [address]

## 2023-03-01 NOTE — Progress Notes (Signed)
[facility] Clinic Note - Initial Visit   Date: 03/01/2023   [name] MRN: [id number] DOB: 1986-02-13   Dear Dr. Yetta Barre:  Thank you for your kind referral of Maddisyn [name] for consultation of neuropathy. Although her history is well known to you, please allow [name] to reiterate it for the purpose of our medical record. The patient was accompanied to the clinic by mother who also provides collateral information.     Timmeka Tullos is a 37 y.o. right-handed female with depression, alcohol use, and OSA presenting for evaluation of neuropathy.   IMPRESSION/PLAN: Painful alcohol-induced neuropathy affecting the feet.  In addition to neuropathic features, she also has bilateral feet swelling and blisters over the feet, which is atypical for neuropathy.  I will check autoimmune labs to look for other causes of neuropathy. Prior labs indicated vitamin B1, B12, and folate deficiency which can be common in alcohol use.    - Check ESR, CRP, SPEP with IFE, ANA, SSA/B, ANCA, copper  - NCS/EMG deferred due to severe pain, she may reconsider when pain is better controlled  - Start lidocaine 5% ointment to feet  - For pain relief, she did not tolerate Lyrica (swelling), and therefore do not suggest gabapentin. Avoid cymbalta due to elevated LFTs.  QTc is borderline which limits use of TCAs.  She is has requested tramadol from PCP's office.  For ongoing pain management, recommend she follow-up with PCP or seek opinion from pain management  - Recommend that she see her dermatologist for skin changes and blisters on the feet   ------------------------------------------------------------- History of present illness: She was diagnosed with psoriasis at the age of 21 and started drinking alcohol around this time.  She reports having 1-3 mixed drinks nightly to help her sleep and cope with medical condition.  In June, she went on vacation and reports drinking  excessively for about 3 weeks.  Soon after returning home, she decided that she did not want to consume alcohol anymore, so stopped.  The following day, she reports having abrupt onset of soreness/achy pain involving the feet.  She also has stinging, needle-pricks.  She saw her PCP started tramadol for pain.  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  She developed swelling in the feet after starting Lyrica, so stopped this.  She was given oxycodone but developed swelling again, so is currently not taking anything.  She gets relief with soaking her feet in ice water. She also noticed blisters erupting over her feet.  She is tearful at today's visit due to pain.    Out-side paper records, electronic medical record, and images have been reviewed where available and summarized as:  Lab Results  Component Value Date   HGBA1C 4.5 (L) 02/02/2023   Lab Results  Component Value Date   VITAMINB12 >1501 (H) 02/02/2023   Lab Results  Component Value Date   TSH 2.14 02/02/2023   No results found for: "ESRSEDRATE", "POCTSEDRATE"  Past Medical History:  Diagnosis Date   Anemia    Blood transfusion without reported diagnosis 2005   Morbid obesity (HCC)    Sleep apnea    hx of    Past Surgical History:  Procedure Laterality Date   BREATH TEK H PYLORI  02/09/2012   Procedure: BREATH TEK H PYLORI;  Surgeon: Valarie Merino, MD;  Location: [facility];  Service: General;  Laterality: N/A;   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   Ferric Maltol (ACCRUFER) 30 MG CAPS Take 1 capsule (30 mg total) by mouth in the morning and at bedtime.   folic acid (FOLVITE) 1 MG tablet Take 1 tablet (1 mg total) by mouth daily.   levalbuterol (XOPENEX HFA) 45 MCG/ACT inhaler INHALE 2 PUFFS INTO THE LUNGS EVERY 6 HOURS AS  NEEDED FOR WHEEZE   lidocaine (XYLOCAINE) 5 % ointment Apply 1 Application topically as needed.   nebivolol (BYSTOLIC) 10 MG tablet Take 1 tablet (10 mg total) by mouth daily.   potassium chloride (KLOR-CON M10) 10 MEQ tablet Take 1 tablet (10 mEq total) by mouth 2 (two) times daily.   torsemide (DEMADEX) 20 MG tablet Take 1 tablet (20 mg total) by mouth daily.   triamterene-hydrochlorothiazide (DYAZIDE) 37.5-25 MG capsule Take 1 each (1 capsule total) by mouth daily.   Vitamin D, Ergocalciferol, (DRISDOL) 1.25 MG (50000 UNIT) CAPS capsule Take 1 capsule (50,000 Units total) by mouth every 7 (seven) days.   zinc gluconate 50 MG tablet Take 1 tablet (50 mg total) by mouth daily.   [DISCONTINUED] FLUoxetine (PROZAC) 10 MG tablet Take 1 tablet (10 mg total) by mouth daily. (Patient not taking: Reported on 02/01/2019)   [DISCONTINUED] oxycodone (OXY-IR) 5 MG capsule Take 1 capsule (5 mg total) by mouth every 6 (six) hours as needed. (Patient not taking: Reported on 03/01/2023)   [DISCONTINUED] phentermine 37.5 MG capsule Take 1 capsule (37.5 mg total) by mouth every morning. (Patient not taking: Reported on 02/01/2019)   [DISCONTINUED] thiamine (VITAMIN B-1) 50 MG tablet Take 1 tablet (50 mg total) by mouth daily. (Patient not taking: Reported on 02/12/2023)   No facility-administered encounter medications on file as of 03/01/2023.    Allergies:  Allergies  Allergen Reactions   Lyrica [Pregabalin]     Swelling in feet, rash     Family History: Family History  Problem Relation Age of Onset   Hypertension Father    Hypertension Mother    Alcohol abuse Neg Hx    Arthritis Neg Hx    Cancer Neg Hx    Early death Neg Hx    Heart disease Neg Hx    Hyperlipidemia Neg Hx    Kidney disease Neg Hx    Stroke Neg Hx     Social History: Social History   Tobacco Use   Smoking status: Never    Passive exposure: Never   Smokeless tobacco: Never  Substance Use Topics   Alcohol use: No   Drug use:  No   Social History   Social History Narrative   Are you right handed or left handed? Right Handed   Are you currently employed ? Yes   What is your current occupation? [employer]   Do you live at home alone? No   Who lives with you? Children    What type of home do you live in: 1 story or 2 story? Lives in two story home        Vital Signs:  BP (!) 141/97   Pulse 91   Ht 5\' 9"  (1.753 m)   Wt 276 lb (125.2 kg)   LMP 12/07/2022 (Approximate)   SpO2 100%   BMI 40.76 kg/m    Neurological Exam: MENTAL STATUS including orientation to time, place, person, recent and remote memory, attention span and concentration, language,  and fund of knowledge is normal.  Speech is not dysarthric. She is tearful throughout the visit.   CRANIAL NERVES: II:  No visual field defects.     III-IV-VI: Pupils equal round and reactive to light.  Normal conjugate, extra-ocular eye movements in all directions of gaze.  No nystagmus.  No ptosis.   V:  Normal facial sensation.    VII:  Normal facial symmetry and movements.   VIII:  Normal hearing and vestibular function.   IX-X:  Normal palatal movement.   XI:  Normal shoulder shrug and head rotation.   XII:  Normal tongue strength and range of motion, no deviation or fasciculation.  MOTOR:  No atrophy, fasciculations or abnormal movements.  No pronator drift.   Upper Extremity:  Right  Left  Deltoid  5/5   5/5   Biceps  5/5   5/5   Triceps  5/5   5/5   Wrist extensors  5/5   5/5   Wrist flexors  5/5   5/5   Finger extensors  5/5   5/5   Finger flexors  5/5   5/5   Dorsal interossei  5/5   5/5   Abductor pollicis  5/5   5/5   Tone (Ashworth scale)  0  0   Lower Extremity:  Right  Left  Hip flexors  5/5   5/5   Knee flexors  5/5   5/5   Knee extensors  5/5   5/5   Dorsiflexors  5/5   5/5   Plantarflexors  5/5   5/5   Toe extensors  5/5   5/5   Toe flexors  5/5   5/5   Tone (Ashworth scale)  0  0   MSRs:                                            Right        Left brachioradialis 2+  2+  biceps 2+  2+  triceps 2+  2+  patellar 1+  1+  ankle jerk 0  0  Hoffman no  no  plantar response down  down   SENSORY:  Vibration is absent below the ankles, temperature and pin prick reduced in the feet bilaterally.   Romberg's sign absent.   COORDINATION/GAIT: Normal finger-to- nose-finger.  Intact rapid alternating movements bilaterally.  Able to rise from a chair without using arms.  Gait mildly wide-based slow, and stable, unassisted.    Thank you for allowing me to participate in patient's care.  If I can answer any additional questions, I would be pleased to do so.    Sincerely,    Abshir Paolini K. Allena Katz, DO

## 2023-03-01 NOTE — Patient Instructions (Addendum)
Check labs  Please see your dermatology about skin changes and blisters on the feet  Prescription for lidocaine ointment has been sent.  I

## 2023-03-02 DIAGNOSIS — I872 Venous insufficiency (chronic) (peripheral): Secondary | ICD-10-CM | POA: Diagnosis not present

## 2023-03-02 DIAGNOSIS — L0390 Cellulitis, unspecified: Secondary | ICD-10-CM | POA: Diagnosis not present

## 2023-03-02 DIAGNOSIS — I890 Lymphedema, not elsewhere classified: Secondary | ICD-10-CM | POA: Diagnosis not present

## 2023-03-03 ENCOUNTER — Telehealth: Payer: Self-pay | Admitting: Neurology

## 2023-03-03 ENCOUNTER — Other Ambulatory Visit (HOSPITAL_COMMUNITY): Payer: Self-pay

## 2023-03-03 DIAGNOSIS — M7989 Other specified soft tissue disorders: Secondary | ICD-10-CM

## 2023-03-03 DIAGNOSIS — G621 Alcoholic polyneuropathy: Secondary | ICD-10-CM

## 2023-03-03 NOTE — Telephone Encounter (Signed)
Patient has two insurances. Her [employer] (ID: [id number]) covers this med with a $0.00 co-pay

## 2023-03-03 NOTE — Telephone Encounter (Signed)
Pt is calling in stating  (1) that when she was here and went downstairs for labs they were not able to get it and now she would like to come in to have them redone was unable to see any future orders to let her know to just come back to have it done.  (2) she also was wanting to know if there is anything that she can do to speed up the PA for Rx lidocaine (XYLOCAINE) 5% and I made her aware that  we are awaiting on it to be approved by her insurance company.  Pt would like to have a call back to let her know about the labs.

## 2023-03-03 NOTE — Telephone Encounter (Signed)
Patient is calling about lab orders being sent over . And would like a call back

## 2023-03-04 ENCOUNTER — Other Ambulatory Visit (INDEPENDENT_AMBULATORY_CARE_PROVIDER_SITE_OTHER): Payer: BC Managed Care – PPO

## 2023-03-04 DIAGNOSIS — G621 Alcoholic polyneuropathy: Secondary | ICD-10-CM

## 2023-03-04 DIAGNOSIS — M7989 Other specified soft tissue disorders: Secondary | ICD-10-CM

## 2023-03-04 LAB — C-REACTIVE PROTEIN: CRP: <1 mg/dL (ref 0.5–20.0)

## 2023-03-04 LAB — SEDIMENTATION RATE: Sed Rate: 93 mm/hr — ABNORMAL HIGH (ref 0–20)

## 2023-03-04 NOTE — Telephone Encounter (Signed)
Called patient and informed her that PA team stated her [employer] insurance will be 0.00 co pay for her lidocaine. Also, informed patient that labs have been placed for her and she may come to our office to check in and then we will go downstairs to get them drawn. Patient verbalized understanding and had no further questions or concerns.

## 2023-03-04 NOTE — Telephone Encounter (Signed)
PER PA Team: Patient has two insurances. Her [employer] (ID: [id number]) covers this med with a $0.00 co-pay

## 2023-03-04 NOTE — Telephone Encounter (Signed)
 PA has been submitted and documented in separate encounter, please sign off on rx in this encounter as PA team is unable to resolve RX requests. Thank you

## 2023-03-04 NOTE — Telephone Encounter (Signed)
Called patient and Mailbox is full and cannot except any messages.

## 2023-03-04 NOTE — Telephone Encounter (Signed)
Message has been sent to the PA team to check status.   Labs needing to be reordered: ESR, CRP, SPEP with IFE, ANA, SSA/B, ANCA, copper

## 2023-03-09 ENCOUNTER — Encounter: Payer: Self-pay | Admitting: Pharmacist

## 2023-03-10 ENCOUNTER — Ambulatory Visit: Payer: BC Managed Care – PPO | Admitting: Internal Medicine

## 2023-03-10 ENCOUNTER — Encounter: Payer: Self-pay | Admitting: Internal Medicine

## 2023-03-10 VITALS — BP 122/76 | HR 76 | Temp 98.6°F | Resp 16 | Ht 69.0 in

## 2023-03-10 DIAGNOSIS — K769 Liver disease, unspecified: Secondary | ICD-10-CM | POA: Diagnosis not present

## 2023-03-10 DIAGNOSIS — G5793 Unspecified mononeuropathy of bilateral lower limbs: Secondary | ICD-10-CM | POA: Diagnosis not present

## 2023-03-10 DIAGNOSIS — R7989 Other specified abnormal findings of blood chemistry: Secondary | ICD-10-CM

## 2023-03-10 DIAGNOSIS — G990 Autonomic neuropathy in diseases classified elsewhere: Secondary | ICD-10-CM | POA: Insufficient documentation

## 2023-03-10 DIAGNOSIS — R209 Unspecified disturbances of skin sensation: Secondary | ICD-10-CM | POA: Diagnosis not present

## 2023-03-10 DIAGNOSIS — I776 Arteritis, unspecified: Secondary | ICD-10-CM

## 2023-03-10 DIAGNOSIS — I739 Peripheral vascular disease, unspecified: Secondary | ICD-10-CM

## 2023-03-10 DIAGNOSIS — E538 Deficiency of other specified B group vitamins: Secondary | ICD-10-CM

## 2023-03-10 LAB — PROTEIN ELECTROPHORESIS, SERUM
Albumin ELP: 3.3 g/dL — ABNORMAL LOW (ref 3.8–4.8)
Alpha 1: 0.4 g/dL — ABNORMAL HIGH (ref 0.2–0.3)
Alpha 2: 0.6 g/dL (ref 0.5–0.9)
Beta 2: 0.5 g/dL (ref 0.2–0.5)
Beta Globulin: 0.6 g/dL (ref 0.4–0.6)
Gamma Globulin: 1.6 g/dL (ref 0.8–1.7)
Total Protein: 7 g/dL (ref 6.1–8.1)

## 2023-03-10 MED ORDER — HYDROMORPHONE HCL 2 MG PO TABS
2.0000 mg | ORAL_TABLET | Freq: Four times a day (QID) | ORAL | 0 refills | Status: DC | PRN
Start: 2023-03-10 — End: 2023-03-30

## 2023-03-10 NOTE — Patient Instructions (Signed)
Neuropathic Pain Neuropathic pain is pain caused by damage to the nerves that are responsible for certain sensations in your body (sensory nerves). Neuropathic pain can make you more sensitive to pain. Even a minor sensation can feel very painful. This is usually a long-term (chronic) condition that can be difficult to treat. The type of pain differs from person to person. It may: Start suddenly (acute), or it may develop slowly and become chronic. Come and go as damaged nerves heal, or it may stay at the same level for years. Cause emotional distress, loss of sleep, and a lower quality of life. What are the causes? The most common cause of this condition is diabetes. Many other diseases and conditions can also cause neuropathic pain. Causes of neuropathic pain can be classified as: Toxic. This is caused by medicines and chemicals. The most common causes of toxic neuropathic pain is damage from medicines that kill cancer cells (chemotherapy) or alcohol abuse. Metabolic. This can be caused by: Diabetes. Lack of vitamins like B12. Traumatic. Any injury that cuts, crushes, or stretches a nerve can cause damage and pain. Compression-related. If a sensory nerve gets trapped or compressed for a long period of time, the blood supply to the nerve can be cut off. Vascular. Many blood vessel diseases can cause neuropathic pain by decreasing blood supply and oxygen to nerves. Autoimmune. This type of pain results from diseases in which the body's defense system (immune system) mistakenly attacks sensory nerves. Examples of autoimmune diseases that can cause neuropathic pain include lupus and multiple sclerosis. Infectious. Many types of viral infections can damage sensory nerves and cause pain. Shingles infection is a common cause of this type of pain. Inherited. Neuropathic pain can be a symptom of many diseases that are passed down through families (genetic). What increases the risk? You are more likely to  develop this condition if: You have diabetes. You smoke. You drink too much alcohol. You are taking certain medicines, including chemotherapy or medicines that treat immune system disorders. What are the signs or symptoms? The main symptom is pain. Neuropathic pain is often described as: Burning. Shock-like. Stinging. Hot or cold. Itching. How is this diagnosed? No single test can diagnose neuropathic pain. It is diagnosed based on: A physical exam and your symptoms. Your health care provider will ask you about your pain. You may be asked to use a pain scale to describe how bad your pain is. Tests. These may be done to see if you have a cause and location of any nerve damage. They include: Nerve conduction studies and electromyography to test how well nerve signals travel through your nerves and muscles (electrodiagnostic testing). Skin biopsy to evaluate for small fiber neuropathy. Imaging studies, such as: X-rays. CT scan. MRI. How is this treated? Treatment for neuropathic pain may change over time. You may need to try different treatment options or a combination of treatments. Some options include: Treating the underlying cause of the neuropathy, such as diabetes, kidney disease, or vitamin deficiencies. Stopping medicines that can cause neuropathy, such as chemotherapy. Medicine to relieve pain. Medicines may include: Prescription or over-the-counter pain medicine. Anti-seizure medicine. Antidepressant medicines. Pain-relieving patches or creams that are applied to painful areas of skin. A medicine to numb the area (local anesthetic), which can be injected as a nerve block. Transcutaneous nerve stimulation. This uses electrical currents to block painful nerve signals. The treatment is painless. Alternative treatments, such as: Acupuncture. Meditation. Massage. Occupational or physical therapy. Pain management programs. Counseling. Follow   these instructions at  home: Medicines  Take over-the-counter and prescription medicines only as told by your health care provider. Ask your health care provider if the medicine prescribed to you: Requires you to avoid driving or using machinery. Can cause constipation. You may need to take these actions to prevent or treat constipation: Drink enough fluid to keep your urine pale yellow. Take over-the-counter or prescription medicines. Eat foods that are high in fiber, such as beans, whole grains, and fresh fruits and vegetables. Limit foods that are high in fat and processed sugars, such as fried or sweet foods. Lifestyle  Have a good support system at home. Consider joining a chronic pain support group. Do not use any products that contain nicotine or tobacco. These products include cigarettes, chewing tobacco, and vaping devices, such as e-cigarettes. If you need help quitting, ask your health care provider. Do not drink alcohol. General instructions Learn as much as you can about your condition. Work closely with all your health care providers to find the treatment plan that works best for you. Ask your health care provider what activities are safe for you. Keep all follow-up visits. This is important. Contact a health care provider if: Your pain treatments are not working. You are having side effects from your medicines. You are struggling with tiredness (fatigue), mood changes, depression, or anxiety. Get help right away if: You have thoughts of hurting yourself. Get help right away if you feel like you may hurt yourself or others, or have thoughts about taking your own life. Go to your nearest emergency room or: Call 911. Call the National Suicide Prevention Lifeline at 1-800-273-8255 or 988. This is open 24 hours a day. Text the Crisis Text Line at 741741. Summary Neuropathic pain is pain caused by damage to the nerves that are responsible for certain sensations in your body (sensory  nerves). Neuropathic pain may come and go as damaged nerves heal, or it may stay at the same level for years. Neuropathic pain is usually a long-term condition that can be difficult to treat. Consider joining a chronic pain support group. This information is not intended to replace advice given to you by your health care provider. Make sure you discuss any questions you have with your health care provider. Document Revised: 02/17/2021 Document Reviewed: 02/17/2021 Elsevier Patient Education  2024 [employer]vier Inc.  

## 2023-03-10 NOTE — Progress Notes (Unsigned)
Subjective:  Patient ID: [name], female    DOB: 02-10-1986  Age: 37 y.o. MRN: [id number]  CC: Follow-up   HPI [name] presents for f/up   Since I last saw her she has seen neurology and dermatology.  Discussed the use of AI scribe software for clinical note transcription with the patient, who gave verbal consent to proceed.  History of Present Illness   The patient, with a history of neuropathy and edema, presents with a two-week history of painful blisters on the legs and feet. The blisters developed shortly after the last consultation. The patient reports that the entire foot is painful, making it difficult to discern if the blisters themselves are painful. The pain is described as constant and is associated with significant swelling and a sensation of heat. The patient was unable to undergo further testing for neuropathy due to the current condition, as the neurologist believed the testing would exacerbate the pain. The patient denies any numbness in the toes or a sensation of cold in the feet. The patient also denies alcohol consumption.       Outpatient Medications Prior to Visit  Medication Sig Dispense Refill   escitalopram (LEXAPRO) 10 MG tablet Take 1 tablet (10 mg total) by mouth daily. 90 tablet 1   Ferric Maltol (ACCRUFER) 30 MG CAPS Take 1 capsule (30 mg total) by mouth in the morning and at bedtime. 180 capsule 1   folic acid (FOLVITE) 1 MG tablet Take 1 tablet (1 mg total) by mouth daily. 90 tablet 1   levalbuterol (XOPENEX HFA) 45 MCG/ACT inhaler INHALE 2 PUFFS INTO THE LUNGS EVERY 6 HOURS AS NEEDED FOR WHEEZE 15 each 3   lidocaine (XYLOCAINE) 5 % ointment Apply 1 Application topically as needed. 35.44 g 1   nebivolol (BYSTOLIC) 10 MG tablet Take 1 tablet (10 mg total) by mouth daily. 90 tablet 0   potassium chloride (KLOR-CON M10) 10 MEQ tablet Take 1 tablet (10 mEq total) by mouth 2 (two) times daily. 180 tablet 0   torsemide (DEMADEX)  20 MG tablet Take 1 tablet (20 mg total) by mouth daily. 90 tablet 0   triamterene-hydrochlorothiazide (DYAZIDE) 37.5-25 MG capsule Take 1 each (1 capsule total) by mouth daily. 90 capsule 0   Vitamin D, Ergocalciferol, (DRISDOL) 1.25 MG (50000 UNIT) CAPS capsule Take 1 capsule (50,000 Units total) by mouth every 7 (seven) days. 12 capsule 0   zinc gluconate 50 MG tablet Take 1 tablet (50 mg total) by mouth daily. 90 tablet 1   traMADol (ULTRAM-ER) 100 MG 24 hr tablet Take 2 tablets (200 mg total) by mouth daily as needed for pain. 60 tablet 0   No facility-administered medications prior to visit.    ROS Review of Systems  Objective:  BP 122/76 (BP Location: Left Arm, Patient Position: Sitting, Cuff Size: Large)   Pulse 76   Temp 98.6 F (37 C) (Oral)   Resp 16   Ht 5\' 9"  (1.753 m)   LMP 03/09/2023 (Exact Date)   SpO2 99%   BMI 40.76 kg/m   BP Readings from Last 3 Encounters:  03/10/23 122/76  03/01/23 (!) 141/97  02/12/23 (!) 150/102    Wt Readings from Last 3 Encounters:  03/01/23 276 lb (125.2 kg)  02/12/23 288 lb (130.6 kg)  02/02/23 288 lb 6.4 oz (130.8 kg)    Physical Exam  Lab Results  Component Value Date   WBC 7.0 02/02/2023   HGB 11.6 (  L) 02/02/2023   HCT 37.4 02/02/2023   PLT 237.0 02/02/2023   GLUCOSE 90 02/02/2023   CHOL 193 02/11/2022   TRIG 134.0 02/11/2022   HDL 82.60 02/11/2022   LDLDIRECT 87.0 10/17/2020   LDLCALC 84 02/11/2022   ALT 63 (H) 02/12/2023   AST 303 (H) 02/12/2023   NA 137 02/02/2023   K 3.6 02/02/2023   CL 99 02/02/2023   CREATININE 0.49 02/02/2023   BUN 7 02/02/2023   CO2 29 02/02/2023   TSH 2.14 02/02/2023   INR 1.2 (H) 02/02/2023   HGBA1C 4.5 (L) 02/02/2023    MR Abdomen W Wo Contrast  Result Date: 09/01/2022 CLINICAL DATA:  Unintentional weight loss. Elevated liver function tests. EXAM: MRI ABDOMEN WITHOUT AND WITH CONTRAST TECHNIQUE: Multiplanar multisequence MR imaging of the abdomen was performed both before and  after the administration of intravenous contrast. CONTRAST:  10 mL Vueway COMPARISON:  None Available. FINDINGS: Lower chest: No acute findings. Hepatobiliary: No hepatic masses identified. Moderate to severe diffuse hepatic steatosis is demonstrated. Small gallstones are noted. No evidence of cholecystitis. No evidence of biliary ductal dilatation or choledocholithiasis. Pancreas: No mass or inflammatory changes. No evidence of pancreatic ductal dilatation. Spleen:  Within normal limits in size and appearance. Adrenals/Urinary Tract: No suspicious masses identified. No evidence of hydronephrosis. Stomach/Bowel: Unremarkable. Vascular/Lymphatic: No pathologically enlarged lymph nodes identified. No acute vascular findings. Other:  None. Musculoskeletal:  No suspicious bone lesions identified. IMPRESSION: Moderate to severe hepatic steatosis. Cholelithiasis. No radiographic evidence of cholecystitis, biliary ductal dilatation, or choledocholithiasis. Electronically Signed   By: Danae Orleans M.D.   On: 09/01/2022 08:04    Assessment & Plan:  Bilateral cold feet -     VAS [name] ABI WITH/WO TBI; Future  Claudication of both lower extremities (HCC) -     VAS [name] ABI WITH/WO TBI; Future  Neuropathy involving both lower extremities -     HYDROmorphone HCl; Take 1 tablet (2 mg total) by mouth every 6 (six) hours as needed for severe pain.  Dispense: 100 tablet; Refill: 0 -     B. 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  Dispense: 100 tablet; Refill: 0  Elevated LFTs -     AntiMicrosomal Ab-Liver / Kidney; Future -     Hepatitis panel, acute; Future -     Lactate dehydrogenase; Future  B12 deficiency -     CBC with Differential/Platelet; Future     Follow-up: Return in about 4 weeks (around 04/07/2023).  Sanda Linger, MD

## 2023-03-11 ENCOUNTER — Other Ambulatory Visit: Payer: BC Managed Care – PPO

## 2023-03-11 ENCOUNTER — Encounter: Payer: Self-pay | Admitting: Internal Medicine

## 2023-03-11 ENCOUNTER — Telehealth: Payer: Self-pay | Admitting: Neurology

## 2023-03-11 DIAGNOSIS — G5793 Unspecified mononeuropathy of bilateral lower limbs: Secondary | ICD-10-CM | POA: Diagnosis not present

## 2023-03-11 DIAGNOSIS — R7989 Other specified abnormal findings of blood chemistry: Secondary | ICD-10-CM | POA: Insufficient documentation

## 2023-03-11 DIAGNOSIS — I776 Arteritis, unspecified: Secondary | ICD-10-CM

## 2023-03-11 HISTORY — DX: Arteritis, unspecified: I77.6

## 2023-03-11 MED ORDER — PREDNISONE 50 MG PO TABS
50.0000 mg | ORAL_TABLET | Freq: Every day | ORAL | 0 refills | Status: DC
Start: 2023-03-11 — End: 2023-03-19

## 2023-03-11 NOTE — Telephone Encounter (Signed)
Caller stated she would like to speak with someone about lab results. Pt is curious to why numbers are the way they are on results and what causes them to be that way

## 2023-03-11 NOTE — Telephone Encounter (Signed)
Please let pt know that her sedimentation rate which is a nonspecific marker of inflammation is elevated.  The remaining labs looking for autoimmune diseases and abnormal proteins were all normal.  I agree with her PCP making the referral to rheumatology to see if there is anything autoimmune causing her feet symptoms.  Also, please remind her to let us know when she would like to move forward with nerve testing.

## 2023-03-12 NOTE — Telephone Encounter (Signed)
Called patient and informed her of Dr. Eliane Decree response. Patient verbalized understanding and thanked me for the call.

## 2023-03-12 NOTE — Telephone Encounter (Signed)
Called patient and informed her of results and recommendations. Patient wanted to know what causes the Sedimentation rate to be elevated and what can be done for it to be lowered?

## 2023-03-12 NOTE — Telephone Encounter (Signed)
Sedimentation rate is nonspecific and does not indicate one particular condition.  It can be elevated by a number of different things, such as inflammation, systemic illness, infection, etc.

## 2023-03-13 LAB — COPPER, SERUM: Copper: 102 ug/dL (ref 70–175)

## 2023-03-13 LAB — IMMUNOFIXATION ELECTROPHORESIS
IgG (Immunoglobin G), Serum: 1950 mg/dL — ABNORMAL HIGH (ref 600–1640)
IgM, Serum: 46 mg/dL — ABNORMAL LOW (ref 50–300)
Immunoglobulin A: 389 mg/dL — ABNORMAL HIGH (ref 47–310)

## 2023-03-13 LAB — ANA: Anti Nuclear Antibody (ANA): NEGATIVE

## 2023-03-13 LAB — ANCA SCREEN W REFLEX TITER: ANCA SCREEN: NEGATIVE

## 2023-03-13 LAB — SJOGREN'S SYNDROME ANTIBODS(SSA + SSB)
SSA (Ro) (ENA) Antibody, IgG: <1 AI
SSB (La) (ENA) Antibody, IgG: <1 AI

## 2023-03-16 ENCOUNTER — Emergency Department (HOSPITAL_COMMUNITY): Payer: BC Managed Care – PPO

## 2023-03-16 ENCOUNTER — Other Ambulatory Visit: Payer: Self-pay

## 2023-03-16 ENCOUNTER — Inpatient Hospital Stay (HOSPITAL_COMMUNITY)
Admission: EM | Admit: 2023-03-16 | Discharge: 2023-03-19 | DRG: 546 | Disposition: A | Discharge status: Home / Self Care | Payer: BC Managed Care – PPO | Attending: Family Medicine | Admitting: Family Medicine

## 2023-03-16 ENCOUNTER — Ambulatory Visit (HOSPITAL_COMMUNITY)
Admission: RE | Admit: 2023-03-16 | Discharge: 2023-03-16 | Disposition: A | Discharge status: Home / Self Care | Payer: BC Managed Care – PPO | Source: Ambulatory Visit | Attending: Internal Medicine | Admitting: Internal Medicine

## 2023-03-16 ENCOUNTER — Encounter (HOSPITAL_COMMUNITY): Payer: Self-pay | Admitting: Internal Medicine

## 2023-03-16 ENCOUNTER — Telehealth: Payer: Self-pay | Admitting: Internal Medicine

## 2023-03-16 DIAGNOSIS — Z6841 Body Mass Index (BMI) 40.0 and over, adult: Secondary | ICD-10-CM

## 2023-03-16 DIAGNOSIS — I10 Essential (primary) hypertension: Secondary | ICD-10-CM | POA: Diagnosis present

## 2023-03-16 DIAGNOSIS — Z79899 Other long term (current) drug therapy: Secondary | ICD-10-CM

## 2023-03-16 DIAGNOSIS — F411 Generalized anxiety disorder: Secondary | ICD-10-CM | POA: Diagnosis not present

## 2023-03-16 DIAGNOSIS — E876 Hypokalemia: Secondary | ICD-10-CM | POA: Diagnosis present

## 2023-03-16 DIAGNOSIS — Z888 Allergy status to other drugs, medicaments and biological substances status: Secondary | ICD-10-CM

## 2023-03-16 DIAGNOSIS — E559 Vitamin D deficiency, unspecified: Secondary | ICD-10-CM | POA: Diagnosis present

## 2023-03-16 DIAGNOSIS — Z8249 Family history of ischemic heart disease and other diseases of the circulatory system: Secondary | ICD-10-CM | POA: Diagnosis not present

## 2023-03-16 DIAGNOSIS — L97522 Non-pressure chronic ulcer of other part of left foot with fat layer exposed: Secondary | ICD-10-CM | POA: Diagnosis not present

## 2023-03-16 DIAGNOSIS — R7989 Other specified abnormal findings of blood chemistry: Secondary | ICD-10-CM | POA: Insufficient documentation

## 2023-03-16 DIAGNOSIS — K760 Fatty (change of) liver, not elsewhere classified: Secondary | ICD-10-CM | POA: Diagnosis not present

## 2023-03-16 DIAGNOSIS — K8020 Calculus of gallbladder without cholecystitis without obstruction: Secondary | ICD-10-CM | POA: Diagnosis not present

## 2023-03-16 DIAGNOSIS — L97512 Non-pressure chronic ulcer of other part of right foot with fat layer exposed: Secondary | ICD-10-CM | POA: Diagnosis present

## 2023-03-16 DIAGNOSIS — L409 Psoriasis, unspecified: Secondary | ICD-10-CM | POA: Diagnosis present

## 2023-03-16 DIAGNOSIS — J4489 Other specified chronic obstructive pulmonary disease: Secondary | ICD-10-CM | POA: Diagnosis present

## 2023-03-16 DIAGNOSIS — R748 Abnormal levels of other serum enzymes: Secondary | ICD-10-CM | POA: Insufficient documentation

## 2023-03-16 DIAGNOSIS — L03115 Cellulitis of right lower limb: Secondary | ICD-10-CM | POA: Diagnosis not present

## 2023-03-16 DIAGNOSIS — M7731 Calcaneal spur, right foot: Secondary | ICD-10-CM | POA: Diagnosis not present

## 2023-03-16 DIAGNOSIS — J45909 Unspecified asthma, uncomplicated: Secondary | ICD-10-CM | POA: Diagnosis not present

## 2023-03-16 DIAGNOSIS — D1803 Hemangioma of intra-abdominal structures: Secondary | ICD-10-CM | POA: Diagnosis not present

## 2023-03-16 DIAGNOSIS — I776 Arteritis, unspecified: Secondary | ICD-10-CM | POA: Diagnosis not present

## 2023-03-16 DIAGNOSIS — L03116 Cellulitis of left lower limb: Secondary | ICD-10-CM | POA: Diagnosis not present

## 2023-03-16 DIAGNOSIS — G4733 Obstructive sleep apnea (adult) (pediatric): Secondary | ICD-10-CM | POA: Diagnosis not present

## 2023-03-16 DIAGNOSIS — G629 Polyneuropathy, unspecified: Secondary | ICD-10-CM | POA: Diagnosis not present

## 2023-03-16 DIAGNOSIS — R600 Localized edema: Secondary | ICD-10-CM | POA: Diagnosis present

## 2023-03-16 DIAGNOSIS — R21 Rash and other nonspecific skin eruption: Secondary | ICD-10-CM | POA: Diagnosis present

## 2023-03-16 DIAGNOSIS — L03119 Cellulitis of unspecified part of limb: Secondary | ICD-10-CM

## 2023-03-16 DIAGNOSIS — M7989 Other specified soft tissue disorders: Secondary | ICD-10-CM | POA: Diagnosis not present

## 2023-03-16 LAB — PHOSPHORUS: Phosphorus: 3.2 mg/dL (ref 2.5–4.6)

## 2023-03-16 LAB — CBC WITH DIFFERENTIAL/PLATELET
Abs Immature Granulocytes: 0.05 10*3/uL (ref 0.00–0.07)
Basophils Absolute: 0 10*3/uL (ref 0.0–0.1)
Basophils Relative: 0 %
Eosinophils Absolute: 0.1 10*3/uL (ref 0.0–0.5)
Eosinophils Relative: 1 %
HCT: 39.7 % (ref 36.0–46.0)
Hemoglobin: 12.2 g/dL (ref 12.0–15.0)
Immature Granulocytes: 1 %
Lymphocytes Relative: 15 %
Lymphs Abs: 1.6 10*3/uL (ref 0.7–4.0)
MCH: 26 pg (ref 26.0–34.0)
MCHC: 30.7 g/dL (ref 30.0–36.0)
MCV: 84.5 fL (ref 80.0–100.0)
Monocytes Absolute: 0.9 10*3/uL (ref 0.1–1.0)
Monocytes Relative: 8 %
Neutro Abs: 8.2 10*3/uL — ABNORMAL HIGH (ref 1.7–7.7)
Neutrophils Relative %: 75 %
Platelets: 309 10*3/uL (ref 150–400)
RBC: 4.7 MIL/uL (ref 3.87–5.11)
RDW: 16.8 % — ABNORMAL HIGH (ref 11.5–15.5)
WBC: 10.8 10*3/uL — ABNORMAL HIGH (ref 4.0–10.5)
nRBC: 0 % (ref 0.0–0.2)

## 2023-03-16 LAB — URINALYSIS, W/ REFLEX TO CULTURE (INFECTION SUSPECTED)
Bilirubin Urine: NEGATIVE
Glucose, UA: NEGATIVE mg/dL
Hgb urine dipstick: NEGATIVE
Ketones, ur: NEGATIVE mg/dL
Nitrite: POSITIVE — AB
Protein, ur: NEGATIVE mg/dL
Specific Gravity, Urine: 1.009 (ref 1.005–1.030)
pH: 6 (ref 5.0–8.0)

## 2023-03-16 LAB — COMPREHENSIVE METABOLIC PANEL
ALT: 25 U/L (ref 0–44)
AST: 44 U/L — ABNORMAL HIGH (ref 15–41)
Albumin: 3.8 g/dL (ref 3.5–5.0)
Alkaline Phosphatase: 90 U/L (ref 38–126)
Anion gap: 16 — ABNORMAL HIGH (ref 5–15)
BUN: 23 mg/dL — ABNORMAL HIGH (ref 6–20)
CO2: 35 mmol/L — ABNORMAL HIGH (ref 22–32)
Calcium: 10.4 mg/dL — ABNORMAL HIGH (ref 8.9–10.3)
Chloride: 85 mmol/L — ABNORMAL LOW (ref 98–111)
Creatinine, Ser: 0.88 mg/dL (ref 0.44–1.00)
GFR, Estimated: >60 mL/min (ref >60)
Glucose, Bld: 85 mg/dL (ref 70–99)
Potassium: <2 mmol/L — CL (ref 3.5–5.1)
Sodium: 136 mmol/L (ref 135–145)
Total Bilirubin: 0.8 mg/dL (ref 0.3–1.2)
Total Protein: 9.1 g/dL — ABNORMAL HIGH (ref 6.5–8.1)

## 2023-03-16 LAB — B. BURGDORFI ANTIBODIES BY WB

## 2023-03-16 LAB — HEPATITIS PANEL, ACUTE
Hep A IgM: REACTIVE — AB
Hep B C IgM: NONREACTIVE
Hepatitis B Surface Ag: NONREACTIVE
Hepatitis C Ab: NONREACTIVE

## 2023-03-16 LAB — HCG, SERUM, QUALITATIVE: Preg, Serum: NEGATIVE

## 2023-03-16 LAB — ANTI-MICROSOMAL ANTIBODY LIVER / KIDNEY: LKM1 Ab: <=20 U (ref <=20.0)

## 2023-03-16 LAB — LACTATE DEHYDROGENASE: LDH: 196 U/L (ref 100–200)

## 2023-03-16 LAB — PROTIME-INR
INR: 1 (ref 0.8–1.2)
Prothrombin Time: 13.8 s (ref 11.4–15.2)

## 2023-03-16 LAB — MAGNESIUM: Magnesium: 2.3 mg/dL (ref 1.7–2.4)

## 2023-03-16 MED ORDER — ENOXAPARIN SODIUM 40 MG/0.4ML IJ SOSY: 40.0000 mg | PREFILLED_SYRINGE | INTRAMUSCULAR | Status: DC

## 2023-03-16 MED ORDER — ONDANSETRON HCL 4 MG PO TABS: 4.0000 mg | ORAL_TABLET | Freq: Four times a day (QID) | ORAL | Status: DC | PRN

## 2023-03-16 MED ORDER — KETOROLAC TROMETHAMINE 30 MG/ML IJ SOLN
30.0000 mg | Freq: Once | INTRAMUSCULAR | Status: AC
  Administered 2023-03-16: 30 mg via INTRAVENOUS
  Filled 2023-03-16: qty 1

## 2023-03-16 MED ORDER — MAGNESIUM SULFATE 2 GM/50ML IV SOLN
2.0000 g | Freq: Once | INTRAVENOUS | Status: AC
  Administered 2023-03-16: 2 g via INTRAVENOUS
  Filled 2023-03-16: qty 50

## 2023-03-16 MED ORDER — GADOBUTROL 1 MMOL/ML IV SOLN
10.0000 mL | Freq: Once | INTRAVENOUS | Status: AC | PRN
  Administered 2023-03-16: 10 mL via INTRAVENOUS

## 2023-03-16 MED ORDER — ACETAMINOPHEN 650 MG RE SUPP: 650.0000 mg | Freq: Four times a day (QID) | RECTAL | Status: DC | PRN

## 2023-03-16 MED ORDER — ENOXAPARIN SODIUM 60 MG/0.6ML IJ SOSY
60.0000 mg | PREFILLED_SYRINGE | INTRAMUSCULAR | Status: DC
  Administered 2023-03-16 – 2023-03-18 (×3): 60 mg via SUBCUTANEOUS
  Filled 2023-03-16 (×4): qty 0.6

## 2023-03-16 MED ORDER — SODIUM CHLORIDE 0.9 % IV SOLN: INTRAVENOUS | Status: DC

## 2023-03-16 MED ORDER — PROCHLORPERAZINE EDISYLATE 10 MG/2ML IJ SOLN
10.0000 mg | Freq: Once | INTRAMUSCULAR | Status: AC
  Administered 2023-03-16: 10 mg via INTRAVENOUS
  Filled 2023-03-16: qty 2

## 2023-03-16 MED ORDER — MORPHINE SULFATE (PF) 4 MG/ML IV SOLN
4.0000 mg | Freq: Once | INTRAVENOUS | Status: AC
  Administered 2023-03-16: 4 mg via INTRAVENOUS
  Filled 2023-03-16: qty 1

## 2023-03-16 MED ORDER — POTASSIUM CHLORIDE IN NACL 40-0.9 MEQ/L-% IV SOLN
INTRAVENOUS | Status: AC
  Filled 2023-03-16 (×3): qty 1000

## 2023-03-16 MED ORDER — SODIUM CHLORIDE 0.9 % IV SOLN
2.0000 g | INTRAVENOUS | Status: DC
  Administered 2023-03-17: 2 g via INTRAVENOUS
  Filled 2023-03-16: qty 20

## 2023-03-16 MED ORDER — SODIUM CHLORIDE 0.9 % IV SOLN
2.0000 g | Freq: Once | INTRAVENOUS | Status: AC
  Administered 2023-03-16: 2 g via INTRAVENOUS
  Filled 2023-03-16: qty 20

## 2023-03-16 MED ORDER — OXYCODONE HCL 5 MG PO TABS
5.0000 mg | ORAL_TABLET | ORAL | Status: DC | PRN
  Administered 2023-03-16 – 2023-03-19 (×11): 5 mg via ORAL
  Filled 2023-03-16 (×12): qty 1

## 2023-03-16 MED ORDER — POTASSIUM CHLORIDE CRYS ER 20 MEQ PO TBCR
40.0000 meq | EXTENDED_RELEASE_TABLET | Freq: Once | ORAL | Status: AC
  Administered 2023-03-16: 40 meq via ORAL
  Filled 2023-03-16: qty 2

## 2023-03-16 MED ORDER — ONDANSETRON HCL 4 MG/2ML IJ SOLN
4.0000 mg | Freq: Once | INTRAMUSCULAR | Status: AC
  Administered 2023-03-16: 4 mg via INTRAVENOUS
  Filled 2023-03-16: qty 2

## 2023-03-16 MED ORDER — ACETAMINOPHEN 325 MG PO TABS
650.0000 mg | ORAL_TABLET | Freq: Four times a day (QID) | ORAL | Status: DC | PRN
  Administered 2023-03-19: 650 mg via ORAL
  Filled 2023-03-16: qty 2

## 2023-03-16 MED ORDER — HYDROMORPHONE HCL 1 MG/ML IJ SOLN
1.0000 mg | Freq: Once | INTRAMUSCULAR | Status: AC
  Administered 2023-03-16: 1 mg via INTRAVENOUS
  Filled 2023-03-16: qty 1

## 2023-03-16 MED ORDER — ONDANSETRON HCL 4 MG/2ML IJ SOLN: 4.0000 mg | Freq: Four times a day (QID) | INTRAMUSCULAR | Status: DC | PRN

## 2023-03-16 MED ORDER — VANCOMYCIN HCL 1250 MG/250ML IV SOLN
1250.0000 mg | Freq: Two times a day (BID) | INTRAVENOUS | Status: DC
  Administered 2023-03-16 – 2023-03-17 (×2): 1250 mg via INTRAVENOUS
  Filled 2023-03-16 (×2): qty 250

## 2023-03-16 MED ORDER — VANCOMYCIN HCL 2000 MG/400ML IV SOLN
2000.0000 mg | Freq: Once | INTRAVENOUS | Status: AC
  Administered 2023-03-16: 2000 mg via INTRAVENOUS
  Filled 2023-03-16: qty 400

## 2023-03-16 MED ORDER — POTASSIUM CHLORIDE 10 MEQ/100ML IV SOLN
10.0000 meq | INTRAVENOUS | Status: AC
  Administered 2023-03-16 (×4): 10 meq via INTRAVENOUS
  Filled 2023-03-16 (×4): qty 100
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
## 2023-03-16 NOTE — ED Notes (Signed)
ED TO INPATIENT HANDOFF REPORT  ED Nurse Name and Phone #: Suann Larry Name/Age/Gender [name] 37 y.o. female Room/Bed: WA14/WA14  Code Status   Code Status: Full Code  Home/SNF/Other Home Patient oriented to: self, place, time, and situation Is this baseline? Yes   Triage Complete: Triage complete  Chief Complaint Bilateral lower leg cellulitis [L03.116, L03.115]  Triage Note Pt has had BL foot swelling for 1 month. Pt is diabetic and has neuropathy. Pt has blisters and open wounds on feet for 3 weeks. Started out as on blister and now they are spreading and will not heal. Pt feet are swollen and painful. Was told by MD to come here. Does not have diagnosis of diabetes.   Allergies Allergies  Allergen Reactions   Lyrica [Pregabalin] Swelling, Rash and Other (See Comments)    Swelling is in the feet    Level of Care/Admitting Diagnosis ED Disposition     ED Disposition  Admit   Condition  --   Comment  Hospital [facility]: [facility] [[id number]]  Level of Care: Telemetry [5]  Admit to tele based on following criteria: Monitor QTC interval  May admit patient to [facility] or [facility] if equivalent level of care is available:: No  Covid Evaluation: Asymptomatic - no recent exposure (last 10 days) testing not required  Diagnosis: Bilateral lower leg cellulitis [721200]  Admitting Physician: Bobette Mo [[id number]]  Attending Physician: Bobette Mo [[id number]]  Certification:: I certify this patient will need inpatient services for at least 2 midnights  Expected Medical Readiness: 03/18/2023          B Medical/Surgery History Past Medical History:  Diagnosis Date   Anemia    B12 deficiency 06/20/2015   Blood transfusion without reported diagnosis 07/07/2003   GAD (generalized anxiety disorder) 10/27/2021   Morbid obesity (HCC)    Sleep apnea    hx of   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     Past Surgical History:  Procedure Laterality Date   BREATH Nelda Bucks  02/09/2012   Procedure: BREATH TEK H PYLORI;  Surgeon: Valarie Merino, MD;  Location: [facility];  Service: General;  Laterality: N/A;   GASTRIC BYPASS  08/29/2012   HERNIA REPAIR  2014     A IV Location/Drains/Wounds Patient Lines/Drains/Airways Status     Active Line/Drains/Airways     Name Placement date Placement time Site Days   Peripheral IV 03/13/20 Right;Anterior Forearm 03/13/20  1740  Forearm  1098   Peripheral IV 03/16/23 20 G 2.5" Left Antecubital 03/16/23  1434  Antecubital  less than 1   Peripheral IV 03/16/23 20 G 2.5" Right Antecubital 03/16/23  1446  Antecubital  less than 1            Intake/Output Last 24 hours No intake or output data in the 24 hours ending 03/16/23 1821  Labs/Imaging Results for orders placed or performed during the hospital encounter of 03/16/23 (from the past 48 hour(s))  Comprehensive metabolic panel     Status: Abnormal   Collection Time: 03/16/23  2:51 PM  Result Value Ref Range   Sodium 136 135 - 145 mmol/L   Potassium <2.0 (LL) 3.5 - 5.1 mmol/L    Comment: CRITICAL RESULT CALLED TO, READ BACK BY AND  VERIFIED WITH RN JACOBI D AT 1627 03/16/23 CRUICKSHANK A    Chloride 85 (L) 98 - 111 mmol/L   CO2 35 (H) 22 - 32 mmol/L   Glucose, Bld 85 70 - 99 mg/dL    Comment: Glucose reference range applies only to samples taken after fasting for at least 8 hours.   BUN 23 (H) 6 - 20 mg/dL   Creatinine, Ser 6.21 0.44 - 1.00 mg/dL   Calcium 30.8 (H) 8.9 - 10.3 mg/dL   Total Protein 9.1 (H) 6.5 - 8.1 g/dL   Albumin 3.8 3.5 - 5.0 g/dL   AST 44 (H) 15 - 41 U/L   ALT 25 0 - 44 U/L   Alkaline Phosphatase 90 38 - 126 U/L   Total Bilirubin 0.8 0.3 - 1.2 mg/dL   GFR, Estimated >65 >78 mL/min    Comment:  (NOTE) Calculated using the CKD-EPI Creatinine Equation (2021)    Anion gap 16 (H) 5 - 15    Comment: Performed at [facility], [address] [place], [place] [address]  CBC with Differential     Status: Abnormal   Collection Time: 03/16/23  2:51 PM  Result Value Ref Range   WBC 10.8 (H) 4.0 - 10.5 K/uL   RBC 4.70 3.87 - 5.11 MIL/uL   Hemoglobin 12.2 12.0 - 15.0 g/dL   HCT 95.2 84.1 - 32.4 %   MCV 84.5 80.0 - 100.0 fL   MCH 26.0 26.0 - 34.0 pg   MCHC 30.7 30.0 - 36.0 g/dL   RDW 40.1 (H) 02.7 - 25.3 %   Platelets 309 150 - 400 K/uL   nRBC 0.0 0.0 - 0.2 %   Neutrophils Relative % 75 %   Neutro Abs 8.2 (H) 1.7 - 7.7 K/uL   Lymphocytes Relative 15 %   Lymphs Abs 1.6 0.7 - 4.0 K/uL   Monocytes Relative 8 %   Monocytes Absolute 0.9 0.1 - 1.0 K/uL   Eosinophils Relative 1 %   Eosinophils Absolute 0.1 0.0 - 0.5 K/uL   Basophils Relative 0 %   Basophils Absolute 0.0 0.0 - 0.1 K/uL   Immature Granulocytes 1 %   Abs Immature Granulocytes 0.05 0.00 - 0.07 K/uL    Comment: Performed at [facility], [address], [place], [place] [address]  Protime-INR     Status: None   Collection Time: 03/16/23  2:51 PM  Result Value Ref Range   Prothrombin Time 13.8 11.4 - 15.2 seconds   INR 1.0 0.8 - 1.2    Comment: (NOTE) INR goal varies based on device and disease states. Performed at [facility], [address], [place], [place] [address]   hCG, serum, qualitative     Status: None   Collection Time: 03/16/23  2:51 PM  Result Value Ref Range   Preg, Serum NEGATIVE NEGATIVE    Comment:        THE SENSITIVITY OF THIS METHODOLOGY IS >10 mIU/mL. Performed at [facility], [address], [place], [place] [address]   Magnesium     Status: None   Collection Time: 03/16/23  2:51 PM  Result Value Ref Range   Magnesium 2.3 1.7 - 2.4 mg/dL    Comment: Performed at [facility], [address], [place], [place] [address]  Phosphorus     Status: None   Collection Time: 03/16/23  2:51 PM  Result Value Ref Range   Phosphorus 3.2 2.5 -  4.6 mg/dL    Comment: Performed at [facility], [address] [place], [place] [address]  Urinalysis, w/ Reflex to Culture (Infection Suspected) -Urine, Clean Catch     Status: Abnormal   Collection Time: 03/16/23  4:02 PM  Result Value Ref Range   Specimen Source URINE, CATHETERIZED    Color, Urine YELLOW YELLOW   APPearance CLEAR CLEAR   Specific Gravity, Urine 1.009 1.005 - 1.030   pH 6.0 5.0 - 8.0   Glucose, UA NEGATIVE NEGATIVE mg/dL   Hgb urine dipstick NEGATIVE NEGATIVE   Bilirubin Urine NEGATIVE NEGATIVE   Ketones, ur NEGATIVE NEGATIVE mg/dL   Protein, ur NEGATIVE NEGATIVE mg/dL   Nitrite POSITIVE (A) NEGATIVE   Leukocytes,Ua TRACE (A) NEGATIVE   RBC / HPF 0-5 0 - 5 RBC/hpf   WBC, UA 6-10 0 - 5 WBC/hpf    Comment:        Reflex urine culture not performed if WBC <=10, OR if Squamous epithelial cells >5. If Squamous epithelial cells >5 suggest recollection.    Bacteria, UA FEW (A) NONE SEEN   Squamous Epithelial / HPF 0-5 0 - 5 /HPF   Hyaline Casts, UA PRESENT     Comment: Performed at [facility], [address] [place], [place] [address]   DG Foot Complete Left  Result Date: 03/16/2023 CLINICAL DATA:  Bilateral foot swelling EXAM: LEFT FOOT - COMPLETE 3+ VIEW COMPARISON:  None Available. FINDINGS: There is no evidence of fracture or dislocation. There is no evidence of arthropathy or other focal bone abnormality. S soft tissue swelling without emphysema. IMPRESSION: No acute osseous abnormality Electronically Signed   By: Jasmine Pang M.D.   On: 03/16/2023 15:27   DG Foot Complete Right  Result Date: 03/16/2023 CLINICAL DATA:  Bilateral foot swelling for 1 month neuropathy open blisters EXAM: RIGHT FOOT COMPLETE - 3+ VIEW COMPARISON:  None Available. FINDINGS: No fracture or  malalignment. No periostitis or osseous destructive change. Small plantar calcaneal spur IMPRESSION: No acute osseous abnormality. Electronically Signed   By: Jasmine Pang M.D.   On: 03/16/2023 15:20    Pending Labs Unresulted Labs (From admission, onward)     Start     Ordered   03/17/23 0500  HIV Antibody (routine testing w rflx)  (HIV Antibody (Routine testing w reflex) panel)  Tomorrow morning,   R        03/16/23 1653   03/17/23 0500  CBC  Tomorrow morning,   R        03/16/23 1653   03/17/23 0500  Comprehensive metabolic panel  Tomorrow morning,   R        03/16/23 1653   03/16/23 1201  Culture, blood (Routine x 2)  BLOOD CULTURE X 2,   R (with STAT occurrences)      03/16/23 1201            Vitals/Pain Today's Vitals   03/16/23 1151 03/16/23 1152 03/16/23 1520 03/16/23 1541  BP:    (!) 126/92  Pulse:    62  Resp:    18  Temp:   97.9 F (36.6 C)   TempSrc:   Oral   SpO2:    100%  Weight:  125.2 kg    Height:  5\' 9"  (1.753 m)    PainSc: 8        Isolation Precautions No active isolations  Medications Medications  potassium chloride 10 mEq in 100 mL IVPB (10 mEq Intravenous  New Bag/Given 03/16/23 1700)  magnesium sulfate IVPB 2 g 50 mL (2 g Intravenous New Bag/Given 03/16/23 1732)  0.9 % NaCl with KCl 40 mEq / L  infusion (has no administration in time range)  acetaminophen (TYLENOL) tablet 650 mg (has no administration in time range)    Or  acetaminophen (TYLENOL) suppository 650 mg (has no administration in time range)  ondansetron (ZOFRAN) tablet 4 mg (has no administration in time range)    Or  ondansetron (ZOFRAN) injection 4 mg (has no administration in time range)  cefTRIAXone (ROCEPHIN) 2 g in sodium chloride 0.9 % 100 mL IVPB (has no administration in time range)  vancomycin (VANCOREADY) IVPB 1250 mg/250 mL (has no administration in time range)  enoxaparin (LOVENOX) injection 60 mg (has no administration in time range)  cefTRIAXone (ROCEPHIN) 2 g in  sodium chloride 0.9 % 100 mL IVPB (0 g Intravenous Stopped 03/16/23 1554)  morphine (PF) 4 MG/ML injection 4 mg (4 mg Intravenous Given 03/16/23 1435)  ondansetron (ZOFRAN) injection 4 mg (4 mg Intravenous Given 03/16/23 1434)  vancomycin (VANCOREADY) IVPB 2000 mg/400 mL (0 mg Intravenous Stopped 03/16/23 1702)  HYDROmorphone (DILAUDID) injection 1 mg (1 mg Intravenous Given 03/16/23 1604)  potassium chloride SA (KLOR-CON M) CR tablet 40 mEq (40 mEq Oral Given 03/16/23 1652)    Mobility walks with person assist     Focused Assessments Wound Check   R Recommendations: See Admitting Provider Note  Report given to:   Additional Notes: .

## 2023-03-16 NOTE — ED Triage Notes (Signed)
Pt has had BL foot swelling for 1 month. Pt is diabetic and has neuropathy. Pt has blisters and open wounds on feet for 3 weeks. Started out as on blister and now they are spreading and will not heal. Pt feet are swollen and painful. Was told by MD to come here. Does not have diagnosis of diabetes.

## 2023-03-16 NOTE — ED Notes (Addendum)
CRITICAL VALUE STICKER  CRITICAL VALUE: Potassium Less than 2.0  RECEIVER (on-site recipient of call): Toy Cookey, RN  DATE & TIME NOTIFIED: 03/16/2023, 1631  MESSENGER (representative from lab):  MD NOTIFIED: Durwin Glaze, MD  TIME OF NOTIFICATION: [phone]  RESPONSE: Orders Received

## 2023-03-16 NOTE — ED Provider Notes (Signed)
[place] EMERGENCY DEPARTMENT AT [facility] Provider Note   CSN: [id number] Arrival date & time: 03/16/23  1139     History {Add pertinent medical, surgical, social history, OB history to HPI:1} Chief Complaint  Patient presents with   Foot Swelling   Wound Check    [name] is a 37 y.o. female.  37 year old female with past medical history of obesity and recently diagnosed neuropathy presenting to the emergency department today with lower extremity wounds.  The patient states that she was recently diagnosed with neuropathy and was being treated symptomatically.  She reports that she has been having a lot of pain in her lower extremities.  She states that over the past week or 2 that she is started to develop these open wounds that have had minimal to no drainage.  She was given a course of prednisone by her primary care doctor in the event that this was due to vasculitis.  The patient followed up today and her symptoms are worsening.  She was subsequently sent to the emergency department for further evaluation.  The patient has seen her dermatologist to recommended a Unaboot for the patient but she was unable to tolerate this due to pain.  She denies any fevers.  She came to the emergency department today after being sent in by her primary care physician.  The history is provided by the patient.  Wound Check       Home Medications Prior to Admission medications   Medication Sig Start Date End Date Taking? Authorizing Provider  escitalopram (LEXAPRO) 10 MG tablet Take 1 tablet (10 mg total) by mouth daily. 05/29/22   Etta Grandchild, MD  Ferric Maltol (ACCRUFER) 30 MG CAPS Take 1 capsule (30 mg total) by mouth in the morning and at bedtime. 02/03/23   Etta Grandchild, MD  folic acid (FOLVITE) 1 MG tablet Take 1 tablet (1 mg total) by mouth daily. 02/11/22   Etta Grandchild, MD  HYDROmorphone (DILAUDID) 2 MG tablet Take 1 tablet (2 mg total) by mouth every 6  (six) hours as needed for severe pain. 03/10/23   Etta Grandchild, MD  levalbuterol [facility] HFA) 45 MCG/ACT inhaler INHALE 2 PUFFS INTO THE LUNGS EVERY 6 HOURS AS NEEDED FOR WHEEZE 06/10/22   Etta Grandchild, MD  lidocaine (XYLOCAINE) 5 % ointment Apply 1 Application topically as needed. 03/01/23   Nita Sickle K, DO  nebivolol (BYSTOLIC) 10 MG tablet Take 1 tablet (10 mg total) by mouth daily. 02/03/23   Etta Grandchild, MD  potassium chloride (KLOR-CON M10) 10 MEQ tablet Take 1 tablet (10 mEq total) by mouth 2 (two) times daily. 02/03/23   Etta Grandchild, MD  predniSONE (DELTASONE) 50 MG tablet Take 1 tablet (50 mg total) by mouth daily with breakfast. 03/11/23   Etta Grandchild, MD  torsemide (DEMADEX) 20 MG tablet Take 1 tablet (20 mg total) by mouth daily. 02/13/23   Etta Grandchild, MD  triamterene-hydrochlorothiazide (DYAZIDE) 37.5-25 MG capsule Take 1 each (1 capsule total) by mouth daily. 02/03/23   Etta Grandchild, MD  Vitamin D, Ergocalciferol, (DRISDOL) 1.25 MG (50000 UNIT) CAPS capsule Take 1 capsule (50,000 Units total) by mouth every 7 (seven) days. 02/03/23   Etta Grandchild, MD  zinc gluconate 50 MG tablet Take 1 tablet (50 mg total) by mouth daily. 07/21/22   Etta Grandchild, MD  FLUoxetine (PROZAC) 10 MG tablet Take 1 tablet (10 mg total) by mouth  daily. Patient not taking: Reported on 02/01/2019 03/17/18 02/01/19  Etta Grandchild, MD  phentermine 37.5 MG capsule Take 1 capsule (37.5 mg total) by mouth every morning. Patient not taking: Reported on 02/01/2019 10/20/18 02/01/19  Etta Grandchild, MD      Allergies    Lyrica [pregabalin]    Review of Systems   Review of Systems  Skin:        Open wounds over bilateral feet and legs  All other systems reviewed and are negative.   Physical Exam Updated Vital Signs BP 135/84 (BP Location: Left Arm)   Pulse 76   Temp 97.6 F (36.4 C) (Oral)   Resp 17   Ht 5\' 9"  (1.753 m)   Wt 125.2 kg   LMP 03/09/2023 (Exact Date)   SpO2 100%   BMI  40.76 kg/m  Physical Exam Gen: NAD Eyes: PERRL, EOMI HEENT: no oropharyngeal swelling Neck: trachea midline Resp: clear to auscultation bilaterally Card: RRR, no murmurs, rubs, or gallops Abd: nontender, nondistended Extremities: The patient has multiple superficial wounds noted over her lower extremities with erythema noted over the bilateral feet that goes to the distal third of the shins bilaterally, there are additional superficial lesions over the patient's upper legs and her thighs Vascular: 2+ radial pulses bilaterally, 2+ DP pulses bilaterally Skin: no rashes Psyc: acting appropriately  ED Results / Procedures / Treatments   Labs (all labs ordered are listed, but only abnormal results are displayed) Labs Reviewed  CULTURE, BLOOD (ROUTINE X 2)  CULTURE, BLOOD (ROUTINE X 2)  COMPREHENSIVE METABOLIC PANEL  CBC WITH DIFFERENTIAL/PLATELET  PROTIME-INR  URINALYSIS, W/ REFLEX TO CULTURE (INFECTION SUSPECTED)  HCG, SERUM, QUALITATIVE    EKG None  Radiology No results found.  Procedures Procedures  {Document cardiac monitor, telemetry assessment procedure when appropriate:1}  Medications Ordered in ED Medications  cefTRIAXone (ROCEPHIN) 2 g in sodium chloride 0.9 % 100 mL IVPB (has no administration in time range)  morphine (PF) 4 MG/ML injection 4 mg (has no administration in time range)  ondansetron (ZOFRAN) injection 4 mg (has no administration in time range)    ED Course/ Medical Decision Making/ A&P   {   Click here for ABCD2, HEART and other calculatorsREFRESH Note before signing :1}                              Medical Decision Making 37 year old female with past medical history of obesity and recently diagnosed neuropathy presenting to the emergency department today with multiple wounds over her lower extremities.  There is appear to be a degree of cellulitis here as the patient does have erythema that is tracking up to her lower legs bilaterally.  I will  cover her with Rocephin for this.  Also give her vancomycin.  I will give patient morphine Zofran for symptoms.  Will obtain x-rays here to evaluate for underlying osteomyelitis.  Given her progressively worsening symptoms as an outpatient she may require admission for IV antibiotics and wound care evaluation.  The patient does have a mildly cytosis.  Her potassium is low at 2.0.  The patient is given oral potassium here.  Magnesium level is ordered.  She will given IV potassium as well.  An EKG is ordered and the patient is placed on telemetry.  Calls placed to the hospitalist service for admission.  Amount and/or Complexity of Data Reviewed Labs: ordered. Radiology: ordered.  Risk Prescription drug management.   ***  {  Document critical care time when appropriate:1} {Document review of labs and clinical decision tools ie heart score, Chads2Vasc2 etc:1}  {Document your independent review of radiology images, and any outside records:1} {Document your discussion with family members, caretakers, and with consultants:1} {Document social determinants of health affecting pt's care:1} {Document your decision making why or why not admission, treatments were needed:1} Final Clinical Impression(s) / ED Diagnoses Final diagnoses:  None    Rx / DC Orders ED Discharge Orders     None

## 2023-03-16 NOTE — Progress Notes (Signed)
Pharmacy Antibiotic Note  [name] is a 37 y.o. female admitted on 03/16/2023 with cellulitis.  Pharmacy has been consulted for vancomycin dosing.  Plan: Vancomycin 2000 mg IV x 1 dose in ED @ 1446 followed by vancomycin 1250 mg IV q12h for eAUC 497.2 using SCr 0.88 & Vd 0.5 Ceftriaxone 2 gm IV q24 per MD   Height: 5\' 9"  (175.3 cm) Weight: 125.2 kg (276 lb 0.3 oz) IBW/kg (Calculated) : 66.2  Temp (24hrs), Avg:97.8 F (36.6 C), Min:97.6 F (36.4 C), Max:97.9 F (36.6 C)  Recent Labs  Lab 03/16/23 1451  WBC 10.8*  CREATININE 0.88    Estimated Creatinine Clearance: 124.1 mL/min (by C-G formula based on SCr of 0.88 mg/dL).    Allergies  Allergen Reactions   Lyrica [Pregabalin]     Swelling in feet, rash     Antimicrobials this admission: 9/10 vanc>> 9/10 CTX>>  Dose adjustments this admission:  Microbiology results: 9/10 BCx2:   Thank you for allowing pharmacy to be a part of this patient's care.   Herby Abraham, Pharm.D Use secure chat for questions 03/16/2023 5:00 PM

## 2023-03-16 NOTE — Progress Notes (Signed)
   03/16/23 2240  BiPAP/CPAP/SIPAP  BiPAP/CPAP/SIPAP Pt Type Adult  Reason BIPAP/CPAP not in use Non-compliant

## 2023-03-16 NOTE — H&P (Signed)
History and Physical    Patient: [name] NWG:[id number] DOB: 10-12-1985 DOA: 03/16/2023 DOS: the patient was seen and examined on 03/16/2023 PCP: Etta Grandchild, MD  Patient coming from: Home  Chief Complaint:  Chief Complaint  Patient presents with   Foot Swelling   Wound Check   HPI: [name] is a 37 y.o. 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  She saw her PCP who gave her a trial of prednisone thinking that this might be vasculitis.  No fever, but some of the wounds have purulent discharge. He denied fever, chills, rhinorrhea, sore throat, wheezing or hemoptysis.  No chest pain, palpitations, diaphoresis, PND or orthopnea.  No abdominal pain, nausea, emesis, diarrhea, constipation, melena or hematochezia.  No flank pain, dysuria, frequency or hematuria.  No polyuria, polydipsia, polyphagia or blurred vision.   Lab work: Her CBC showed a white count 10.8, hemoglobin 12.2 g/dL platelets 578.  Normal PT and INR.  Serum pregnancy test negative.  CMP with a sodium 136, potassium less than 2.0, chloride 85 and CO2 35 mmol/L with an anion gap of 16.  Glucose 85, BUN 23, creatinine 0.88 and corrected calcium 10.2 mg/dL.  Total protein 9.1 and albumin 3.8 g/dL.  AST was 44 units/L.  The rest of the LFTs were normal.  Recently done immunology panel showing positive ANA titer and increased immunoglobulin open (see full report for further detail..  Imaging: Bilateral feet x-rays with no acute osseous abnormality.   ED course: Initial vital  signs were temperature 97.6 F, pulse 76, respiration 17, BP 135/84 mmHg and O2 sat 100% on room air.  The patient received ceftriaxone 2 g IVPB, vancomycin 2 g IVPB, hydromorphone 1 mg IVP, morphine 4 mg IVP, ondansetron 4 mg IVP, KCl 10 mEq IVPB x 4, KCl 40 mEq p.o. x 1 and IR-guided magnesium sulfate 2 g IVPB.  Review of Systems: As mentioned in the history of present illness. All other systems reviewed and are negative. 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 She has never been exposed to tobacco smoke. She has never used smokeless tobacco. She reports that she does not drink alcohol and does not use drugs.  Allergies  Allergen Reactions   Lyrica [Pregabalin]     Swelling in feet, rash     Family History  Problem Relation Age of Onset   Hypertension Father    Hypertension Mother    Alcohol abuse Neg Hx    Arthritis Neg Hx    Cancer Neg Hx    Early death Neg Hx    Heart disease Neg Hx    Hyperlipidemia Neg Hx  Kidney disease Neg Hx    Stroke Neg Hx     Prior to Admission medications   Medication Sig Start Date End Date Taking? Authorizing Provider  escitalopram (LEXAPRO) 10 MG tablet Take 1 tablet (10 mg total) by mouth daily. 05/29/22   Etta Grandchild, MD  Ferric Maltol (ACCRUFER) 30 MG CAPS Take 1 capsule (30 mg total) by mouth in the morning and at bedtime. 02/03/23   Etta Grandchild, MD  folic acid (FOLVITE) 1 MG tablet Take 1 tablet (1 mg total) by mouth daily. 02/11/22   Etta Grandchild, MD  HYDROmorphone (DILAUDID) 2 MG tablet Take 1 tablet (2 mg  total) by mouth every 6 (six) hours as needed for severe pain. 03/10/23   Etta Grandchild, MD  levalbuterol [facility] HFA) 45 MCG/ACT inhaler INHALE 2 PUFFS INTO THE LUNGS EVERY 6 HOURS AS NEEDED FOR WHEEZE 06/10/22   Etta Grandchild, MD  lidocaine (XYLOCAINE) 5 % ointment Apply 1 Application topically as needed. 03/01/23   Nita Sickle K, DO  nebivolol (BYSTOLIC) 10 MG tablet Take 1 tablet (10 mg total) by mouth daily. 02/03/23   Etta Grandchild, MD  potassium chloride (KLOR-CON M10) 10 MEQ tablet Take 1 tablet (10 mEq total) by mouth 2 (two) times daily. 02/03/23   Etta Grandchild, MD  predniSONE (DELTASONE) 50 MG tablet Take 1 tablet (50 mg total) by mouth daily with breakfast. 03/11/23   Etta Grandchild, MD  torsemide (DEMADEX) 20 MG tablet Take 1 tablet (20 mg total) by mouth daily. 02/13/23   Etta Grandchild, MD  triamterene-hydrochlorothiazide (DYAZIDE) 37.5-25 MG capsule Take 1 each (1 capsule total) by mouth daily. 02/03/23   Etta Grandchild, MD  Vitamin D, Ergocalciferol, (DRISDOL) 1.25 MG (50000 UNIT) CAPS capsule Take 1 capsule (50,000 Units total) by mouth every 7 (seven) days. 02/03/23   Etta Grandchild, MD  zinc gluconate 50 MG tablet Take 1 tablet (50 mg total) by mouth daily. 07/21/22   Etta Grandchild, MD  FLUoxetine (PROZAC) 10 MG tablet Take 1 tablet (10 mg total) by mouth daily. Patient not taking: Reported on 02/01/2019 03/17/18 02/01/19  Etta Grandchild, MD  phentermine 37.5 MG capsule Take 1 capsule (37.5 mg total) by mouth every morning. Patient not taking: Reported on 02/01/2019 10/20/18 02/01/19  Etta Grandchild, MD    Physical Exam: Vitals:   03/16/23 1144 03/16/23 1152 03/16/23 1520 03/16/23 1541  BP: 135/84   (!) 126/92  Pulse: 76   62  Resp: 17   18  Temp: 97.6 F (36.4 C)  97.9 F (36.6 C)   TempSrc: Oral  Oral   SpO2: 100%   100%  Weight:  125.2 kg    Height:  5\' 9"  (1.753 m)     Physical Exam Vitals and nursing note reviewed.  Constitutional:      General: She is  awake. She is not in acute distress.    Appearance: Normal appearance. She is morbidly obese. She is ill-appearing. She is not toxic-appearing.  HENT:     Head: Normocephalic.     Nose: No rhinorrhea.     Mouth/Throat:     Mouth: Mucous membranes are moist.  Eyes:     General: No scleral icterus.    Pupils: Pupils are equal, round, and reactive to light.  Neck:     Vascular: No JVD.  Cardiovascular:     Rate and Rhythm: Normal rate and regular rhythm.  Heart sounds: S1 normal and S2 normal.  Pulmonary:     Effort: Pulmonary effort is normal.     Breath sounds: Normal breath sounds. No wheezing, rhonchi or rales.  Abdominal:     General: Bowel sounds are normal. There is no distension.     Palpations: Abdomen is soft.     Tenderness: There is no abdominal tenderness.  Musculoskeletal:     Cervical back: Neck supple.     Right lower leg: No edema.     Left lower leg: No edema.  Skin:    General: Skin is warm and dry.     Findings: Erythema and wound present.     Comments: Multiple wounds in both extremities in pretibial area and both feet.  Please see pictures below.  Neurological:     General: No focal deficit present.     Mental Status: She is alert and oriented to person, place, and time.  Psychiatric:        Mood and Affect: Mood normal.        Behavior: Behavior normal. Behavior is cooperative.     These wounds are chronic and from psoriasis.  The wounds from the feet are recent.         Data Reviewed:  Results are pending, will review when available.  Assessment and Plan: Principal Problem:   Bilateral lower leg cellulitis Admit to telemetry/inpatient. Continue IV fluids. Continue ceftriaxone every 24 hours.   Continue vancomycin per pharmacy. Analgesics as needed. Follow-up blood culture and sensitivity Follow CBC and CMP in a.m.  Active Problems:   Hypokalemia Replacing. Supplemented magnesium. Follow potassium level in AM.    Primary  hypertension Continue antihypertensives pending med reconciliation.    Obstructive sleep apnea syndrome CPAP at bedtime.    Asthmatic bronchitis , chronic Levalbuterol as needed.    GAD (generalized anxiety disorder) Awaiting med rec for home med orders.    Advance Care Planning:   Code Status: Full Code   Consults:   Family Communication:   Severity of Illness: The appropriate patient status for this patient is INPATIENT. Inpatient status is judged to be reasonable and necessary in order to provide the required intensity of service to ensure the patient's safety. The patient's presenting symptoms, physical exam findings, and initial radiographic and laboratory data in the context of their chronic comorbidities is felt to place them at high risk for further clinical deterioration. Furthermore, it is not anticipated that the patient will be medically stable for discharge from the hospital within 2 midnights of admission.   * I certify that at the point of admission it is my clinical judgment that the patient will require inpatient hospital care spanning beyond 2 midnights from the point of admission due to high intensity of service, high risk for further deterioration and high frequency of surveillance required.*  Author: Bobette Mo, MD 03/16/2023 4:54 PM  For on call review [email or web address].   This document was prepared using Dragon voice recognition software and may contain some unintended transcription errors.

## 2023-03-16 NOTE — Progress Notes (Signed)
A consult was received from an ED physician for vancomycin per pharmacy dosing.  The patient's profile has been reviewed for ht/wt/allergies/indication/available labs.    A one time order has been placed for vancomycin 2000 mg IV x1.  Further antibiotics/pharmacy consults should be ordered by admitting physician if indicated.                       Thank you, Lucia Gaskins 03/16/2023  1:31 PM

## 2023-03-16 NOTE — Telephone Encounter (Signed)
Disability paperwork received via fax from [facility] and placed in provider box up front.

## 2023-03-16 NOTE — Progress Notes (Signed)
This RN to bedside, upon arrival Rn placing USGPIV. Primary RN to re-consult should pt need another access.

## 2023-03-17 ENCOUNTER — Encounter (HOSPITAL_COMMUNITY): Payer: Self-pay | Admitting: Internal Medicine

## 2023-03-17 ENCOUNTER — Other Ambulatory Visit: Payer: BC Managed Care – PPO

## 2023-03-17 DIAGNOSIS — L03116 Cellulitis of left lower limb: Secondary | ICD-10-CM | POA: Diagnosis not present

## 2023-03-17 DIAGNOSIS — L03115 Cellulitis of right lower limb: Secondary | ICD-10-CM | POA: Diagnosis not present

## 2023-03-17 LAB — COMPREHENSIVE METABOLIC PANEL
ALT: 23 U/L (ref 0–44)
AST: 41 U/L (ref 15–41)
Albumin: 3.6 g/dL (ref 3.5–5.0)
Alkaline Phosphatase: 87 U/L (ref 38–126)
Anion gap: 13 (ref 5–15)
BUN: 18 mg/dL (ref 6–20)
CO2: 30 mmol/L (ref 22–32)
Calcium: 9.4 mg/dL (ref 8.9–10.3)
Chloride: 92 mmol/L — ABNORMAL LOW (ref 98–111)
Creatinine, Ser: 0.89 mg/dL (ref 0.44–1.00)
GFR, Estimated: >60 mL/min (ref >60)
Glucose, Bld: 91 mg/dL (ref 70–99)
Potassium: 2.8 mmol/L — ABNORMAL LOW (ref 3.5–5.1)
Sodium: 135 mmol/L (ref 135–145)
Total Bilirubin: 0.7 mg/dL (ref 0.3–1.2)
Total Protein: 8.3 g/dL — ABNORMAL HIGH (ref 6.5–8.1)

## 2023-03-17 LAB — CBC
HCT: 40.2 % (ref 36.0–46.0)
Hemoglobin: 12.2 g/dL (ref 12.0–15.0)
MCH: 26.5 pg (ref 26.0–34.0)
MCHC: 30.3 g/dL (ref 30.0–36.0)
MCV: 87.4 fL (ref 80.0–100.0)
Platelets: 290 10*3/uL (ref 150–400)
RBC: 4.6 MIL/uL (ref 3.87–5.11)
RDW: 16.8 % — ABNORMAL HIGH (ref 11.5–15.5)
WBC: 9.5 10*3/uL (ref 4.0–10.5)
nRBC: 0 % (ref 0.0–0.2)

## 2023-03-17 LAB — HIV ANTIBODY (ROUTINE TESTING W REFLEX): HIV Screen 4th Generation wRfx: NONREACTIVE

## 2023-03-17 MED ORDER — HYDROMORPHONE HCL 2 MG PO TABS
2.0000 mg | ORAL_TABLET | ORAL | Status: DC | PRN
  Administered 2023-03-17 – 2023-03-19 (×8): 2 mg via ORAL
  Filled 2023-03-17 (×8): qty 1

## 2023-03-17 MED ORDER — TRIAMTERENE-HCTZ 37.5-25 MG PO CAPS
1.0000 | ORAL_CAPSULE | Freq: Every day | ORAL | Status: DC
  Administered 2023-03-17: 1 via ORAL
  Filled 2023-03-17 (×2): qty 1

## 2023-03-17 MED ORDER — HYDROMORPHONE HCL 2 MG PO TABS
2.0000 mg | ORAL_TABLET | Freq: Four times a day (QID) | ORAL | Status: DC | PRN
  Administered 2023-03-17: 2 mg via ORAL
  Filled 2023-03-17: qty 1

## 2023-03-17 MED ORDER — DULOXETINE HCL 20 MG PO CPEP
20.0000 mg | ORAL_CAPSULE | Freq: Every day | ORAL | Status: DC
  Administered 2023-03-17 – 2023-03-19 (×3): 20 mg via ORAL
  Filled 2023-03-17 (×3): qty 1

## 2023-03-17 MED ORDER — TRIAMCINOLONE 0.1 % CREAM:EUCERIN CREAM 1:1
TOPICAL_CREAM | Freq: Two times a day (BID) | CUTANEOUS | Status: DC
  Filled 2023-03-17: qty 1

## 2023-03-17 MED ORDER — METHYLPREDNISOLONE SODIUM SUCC 125 MG IJ SOLR
125.0000 mg | INTRAMUSCULAR | Status: DC
  Administered 2023-03-17 – 2023-03-18 (×2): 125 mg via INTRAVENOUS
  Filled 2023-03-17 (×2): qty 2

## 2023-03-17 MED ORDER — POTASSIUM CHLORIDE 20 MEQ PO PACK
40.0000 meq | PACK | Freq: Two times a day (BID) | ORAL | Status: AC
  Administered 2023-03-17 (×2): 40 meq via ORAL
  Filled 2023-03-17 (×2): qty 2

## 2023-03-17 MED ORDER — TORSEMIDE 20 MG PO TABS
20.0000 mg | ORAL_TABLET | Freq: Every day | ORAL | Status: DC
  Administered 2023-03-17 – 2023-03-19 (×3): 20 mg via ORAL
  Filled 2023-03-17 (×3): qty 1

## 2023-03-17 MED ORDER — POTASSIUM CHLORIDE CRYS ER 20 MEQ PO TBCR
40.0000 meq | EXTENDED_RELEASE_TABLET | Freq: Once | ORAL | Status: AC
  Administered 2023-03-17: 40 meq via ORAL
  Filled 2023-03-17: qty 2

## 2023-03-17 MED ORDER — LEVALBUTEROL HCL 0.63 MG/3ML IN NEBU: 0.6300 mg | INHALATION_SOLUTION | Freq: Three times a day (TID) | RESPIRATORY_TRACT | Status: DC | PRN

## 2023-03-17 NOTE — Progress Notes (Signed)
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  She has seen her PCP who gave her a trial of prednisone thinking that this might be vasculitis.  Patient denies any fever. Some of the wounds have purulent discharge.  Pertinent labs include pregnancy test negative, serum potassium 2.0 other labs were within normal range.  Patient has recently done immunologic panel showing positive ANA titer and increased immunoglobulin. Bilateral feet x-ray no acute osseous abnormality. Patient was started on empiric antibiotics (ceftriaxone, vancomycin and started on IV hydration and IV magnesium and IV potassium chloride replacement.  Assessment & Plan:   Principal Problem:   Bilateral lower leg cellulitis Active Problems:   Primary hypertension   Obstructive sleep apnea syndrome   Asthmatic bronchitis , chronic   Hypokalemia   GAD (generalized anxiety disorder)  Bilateral lower leg cellulitis: Patient presented with bilateral patchy spots on dorsal aspect of both feet. Differential could be broad, cellulitis, vasculitis, drug reaction. Initiated on IV antibiotics (ceftriaxone and  Vancomycin) for possible cellulitis. Continue IV fluids. Continue Analgesics as needed. Follow-up blood culture and sensitivity Follow CBC and CMP in a.m. Wound care consult. Infectious disease consulted for further recommendation.  Hypokalemia: Replaced.  Continue to monitor   Essential  hypertension: Continue torsemide, triamterene, HCTZ   Obstructive sleep apnea syndrome CPAP at bedtime.   Asthmatic bronchitis , chronic Levalbuterol as needed.   GAD (generalized anxiety disorder) Continue Cymbalta.  History of psoriasis: Continue triamterene local.  Bilateral leg edema Continue torsemide 20 mg daily:  Peripheral neuropathy Continue Cymbalta. Continue Dilaudid as needed for pain control.  DVT prophylaxis:Lovenox Code Status: Full code Family Communication:Mother at bed side. Disposition Plan:    Status is: Inpatient Remains inpatient appropriate because: Admitted for suspected cellulitis in the setting of chronic wound.    Consultants:  Infectious diseases  Procedures: None  Antimicrobials:  Anti-infectives (From admission, onward)    Start     Dose/Rate Route Frequency Ordered Stop   03/17/23 1300  cefTRIAXone (ROCEPHIN) 2 g in sodium chloride 0.9 % 100 mL IVPB        2 g 200 mL/hr over 30 Minutes Intravenous Every 24 hours 03/16/23 1653     03/17/23 0000  vancomycin (VANCOREADY) IVPB 1250 mg/250 mL        1,250 mg 166.7 mL/hr over 90 Minutes Intravenous Every 12 hours 03/16/23 1702     03/16/23 1345  vancomycin (VANCOREADY) IVPB 2000 mg/400 mL        2,000 mg 200 mL/hr over 120 Minutes Intravenous  Once 03/16/23 1334 03/16/23 1702   03/16/23 1330  cefTRIAXone (ROCEPHIN) 2 g in sodium chloride 0.9 % 100 mL IVPB        2 g 200 mL/hr over 30 Minutes Intravenous  Once 03/16/23 1315 03/16/23 1554       Subjective: Patient was seen and examined at bedside.  Overnight events noted.  Patient reports doing same. She reports having significant pain in both lower extremities.  Objective: Vitals:   03/16/23 2200 03/17/23 0239 03/17/23 7829  03/17/23 1027  BP: 104/63 128/80 116/73 127/71  Pulse: 62 78 62 100  Resp: 20 20 (!) 22 19  Temp: 98.4 F (36.9 C) 98.7 F (37.1 C) (!) 97.5 F (36.4 C) 98.8 F (37.1 C)  TempSrc: Oral Oral  Oral  SpO2:  96% 97% 99% 98%  Weight:      Height:        Intake/Output Summary (Last 24 hours) at 03/17/2023 1335 Last data filed at 03/17/2023 1610 Gross per 24 hour  Intake 728.37 ml  Output --  Net 728.37 ml   Filed Weights   03/16/23 1152  Weight: 125.2 kg    Examination:  General exam: Appears calm and comfortable, not in any acute distress Respiratory system: Clear to auscultation. Respiratory effort normal.  RR 15 Cardiovascular system: S1 & S2 heard, regular rate and rhythm, no murmur.  Edema+ Gastrointestinal system: Abdomen is non distended, soft and non tender. Normal bowel sounds heard. Central nervous system: Alert and oriented x 3. No focal neurological deficits. Extremities: Edema+, no cyanosis, no clubbing Skin: Multiple wounds in both extremities and pretibial area in both feet Psychiatry: Judgement and insight appear normal. Mood & affect appropriate.     Data Reviewed: I have personally reviewed following labs and imaging studies  CBC: Recent Labs  Lab 03/16/23 1451 03/17/23 0434  WBC 10.8* 9.5  NEUTROABS 8.2*  --   HGB 12.2 12.2  HCT 39.7 40.2  MCV 84.5 87.4  PLT 309 290   Basic Metabolic Panel: Recent Labs  Lab 03/16/23 1451 03/17/23 0434  NA 136 135  K <2.0* 2.8*  CL 85* 92*  CO2 35* 30  GLUCOSE 85 91  BUN 23* 18  CREATININE 0.88 0.89  CALCIUM 10.4* 9.4  MG 2.3  --   PHOS 3.2  --    GFR: Estimated Creatinine Clearance: 122.7 mL/min (by C-G formula based on SCr of 0.89 mg/dL). Liver Function Tests: Recent Labs  Lab 03/16/23 1451 03/17/23 0434  AST 44* 41  ALT 25 23  ALKPHOS 90 87  BILITOT 0.8 0.7  PROT 9.1* 8.3*  ALBUMIN 3.8 3.6   No results for input(s): "LIPASE", "AMYLASE" in the last 168 hours. No results for input(s): "AMMONIA" in the last 168 hours. Coagulation Profile: Recent Labs  Lab 03/16/23 1451  INR 1.0   Cardiac Enzymes: No results for input(s): "CKTOTAL", "CKMB", "CKMBINDEX", "TROPONINI" in the last 168 hours. BNP  (last 3 results) Recent Labs    02/12/23 1427  PROBNP 75.0   HbA1C: No results for input(s): "HGBA1C" in the last 72 hours. CBG: No results for input(s): "GLUCAP" in the last 168 hours. Lipid Profile: No results for input(s): "CHOL", "HDL", "LDLCALC", "TRIG", "CHOLHDL", "LDLDIRECT" in the last 72 hours. Thyroid Function Tests: No results for input(s): "TSH", "T4TOTAL", "FREET4", "T3FREE", "THYROIDAB" in the last 72 hours. Anemia Panel: No results for input(s): "VITAMINB12", "FOLATE", "FERRITIN", "TIBC", "IRON", "RETICCTPCT" in the last 72 hours. Sepsis Labs: No results for input(s): "PROCALCITON", "LATICACIDVEN" in the last 168 hours.  Recent Results (from the past 240 hour(s))  Culture, blood (Routine x 2)     Status: None (Preliminary result)   Collection Time: 03/16/23  2:51 PM   Specimen: BLOOD  Result Value Ref Range Status   Specimen Description   Final    BLOOD RIGHT ANTECUBITAL Performed at [facility], [address] [place], [place] [address]    Special Requests   Final    BOTTLES DRAWN AEROBIC AND ANAEROBIC  Blood Culture results may not be optimal due to an excessive volume of blood received in culture bottles Performed at [facility], [address] [place], [place] [address]    Culture   Final    NO GROWTH < 24 HOURS Performed at [facility], [address] [place], [place] [address]    Report Status PENDING  Incomplete  Culture, blood (Routine x 2)     Status: None (Preliminary result)   Collection Time: 03/16/23  2:57 PM   Specimen: BLOOD  Result Value Ref Range Status   Specimen Description   Final    BLOOD LEFT ANTECUBITAL Performed at [facility], [address] [place], [place] [address]    Special Requests   Final    BOTTLES DRAWN AEROBIC AND ANAEROBIC Blood Culture results may not be optimal due to an excessive volume of blood received in culture bottles Performed at [facility] , [address] [place], [place] [address]    Culture   Final    NO GROWTH < 24 HOURS Performed at [facility], [address], [place], [place] [address]    Report Status PENDING  Incomplete    Radiology Studies: DG Foot Complete Left  Result Date: 03/16/2023 CLINICAL DATA:  Bilateral foot swelling EXAM: LEFT FOOT - COMPLETE 3+ VIEW COMPARISON:  None Available. FINDINGS: There is no evidence of fracture or dislocation. There is no evidence of arthropathy or other focal bone abnormality. S soft tissue swelling without emphysema. IMPRESSION: No acute osseous abnormality Electronically Signed   By: Jasmine Pang M.D.   On: 03/16/2023 15:27   DG Foot Complete Right  Result Date: 03/16/2023 CLINICAL DATA:  Bilateral foot swelling for 1 month neuropathy open blisters EXAM: RIGHT FOOT COMPLETE - 3+ VIEW COMPARISON:  None Available. FINDINGS: No fracture or malalignment. No periostitis or osseous destructive change. Small plantar calcaneal spur IMPRESSION: No acute osseous abnormality. Electronically Signed   By: Jasmine Pang M.D.   On: 03/16/2023 15:20    Scheduled Meds:  DULoxetine  20 mg Oral Daily   enoxaparin (LOVENOX) injection  60 mg Subcutaneous Q24H   potassium chloride  40 mEq Oral BID   torsemide  20 mg Oral Daily   triamcinolone 0.1 % cream : eucerin   Topical BID   triamterene-hydrochlorothiazide  1 capsule Oral Daily   Continuous Infusions:  cefTRIAXone (ROCEPHIN)  IV     vancomycin Stopped (03/17/23 0022)     LOS: 1 day    Time spent: 50 mins  Willeen Niece, MD [facility]   If 7PM-7AM, please contact night-coverage

## 2023-03-17 NOTE — Consult Note (Signed)
[facility]       Reason for Consult: rash    Referring Physician: Dr. Idelle Leech  Principal Problem:   Bilateral lower leg cellulitis Active Problems:   Primary hypertension   Obstructive sleep apnea syndrome   Asthmatic bronchitis , chronic   Hypokalemia   GAD (generalized anxiety disorder)    DULoxetine  20 mg Oral Daily   enoxaparin (LOVENOX) injection  60 mg Subcutaneous Q24H   potassium chloride  40 mEq Oral BID   torsemide  20 mg Oral Daily   triamcinolone 0.1 % cream : eucerin   Topical BID   triamterene-hydrochlorothiazide  1 capsule Oral Daily    Recommendations: Biopsy Stop antibiotics Steroids Will check cryoglobulins, RF, C3/4  Assessment: She has an ongoing progressive rash on bilateral feet of unknown etiology. Differential includes vasculitides.  No signs of infection.    Discussed with Dr. Dimple Casey of rheumatology, will add labs, start steroids, high dose.     HPI: [name] is a 37 y.o. female with psoriasis and OSA who has had pain and rashes on her feet bilateral for over 1 month.  No fever, no chills. Lesions start out as pustular lesions, become ulcers.  She feels her feet are hot.  Painful with any movement and she feels better with her feet down.  Has seen neurology for neuropathy.  Has not had a biopsy of the lesions.     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      Social History    Tobacco Use   Smoking status: Never    Passive exposure: Never   Smokeless tobacco: Never  Substance Use Topics   Alcohol use: No   Drug use: No    Family History  Problem Relation Age of Onset   Hypertension Father    Hypertension Mother    Alcohol abuse Neg Hx    Arthritis Neg Hx    Cancer Neg Hx    Early death Neg Hx    Heart disease Neg Hx    Hyperlipidemia Neg Hx    Kidney disease Neg Hx    Stroke Neg Hx     Allergies  Allergen Reactions   Lyrica [Pregabalin] Swelling, Rash and Other (See Comments)    Swelling is in the feet    Physical Exam: Constitutional: in no apparent distress  Vitals:   03/17/23 0525 03/17/23 1027  BP: 116/73 127/71  Pulse: 62 100  Resp: (!) 22 19  Temp: (!) 97.5 F (36.4 C) 98.8 F (37.1 C)  SpO2: 99% 98%   EYES: anicteric Respiratory: normal respiratory effort Musculoskeletal: bilateral lesions on legs c/w psoriasis; bilateral feet with ulcerating lesions  Lab Results  Component Value Date   WBC 9.5 03/17/2023   HGB 12.2 03/17/2023   HCT 40.2 03/17/2023   MCV 87.4 03/17/2023   PLT 290 03/17/2023    Lab Results  Component Value Date  CREATININE 0.89 03/17/2023   BUN 18 03/17/2023   NA 135 03/17/2023   K 2.8 (L) 03/17/2023   CL 92 (L) 03/17/2023   CO2 30 03/17/2023    Lab Results  Component Value Date   ALT 23 03/17/2023   AST 41 03/17/2023   ALKPHOS 87 03/17/2023     Microbiology: Recent Results (from the past 240 hour(s))  Culture, blood (Routine x 2)     Status: None (Preliminary result)   Collection Time: 03/16/23  2:51 PM   Specimen: BLOOD  Result Value Ref Range Status   Specimen Description   Final    BLOOD RIGHT ANTECUBITAL Performed at [facility], [address], [place], [place] [address]    Special Requests   Final    BOTTLES DRAWN AEROBIC AND ANAEROBIC Blood Culture results may not be optimal due to an excessive volume of blood received in culture bottles Performed at  [facility], [address] [place], [place] [address]    Culture   Final    NO GROWTH < 24 HOURS Performed at [facility], [address], [place], [place] [address]    Report Status PENDING  Incomplete  Culture, blood (Routine x 2)     Status: None (Preliminary result)   Collection Time: 03/16/23  2:57 PM   Specimen: BLOOD  Result Value Ref Range Status   Specimen Description   Final    BLOOD LEFT ANTECUBITAL Performed at [facility], [address], [place], [place] [address]    Special Requests   Final    BOTTLES DRAWN AEROBIC AND ANAEROBIC Blood Culture results may not be optimal due to an excessive volume of blood received in culture bottles Performed at [facility], [address], [place], [place] [address]    Culture   Final    NO GROWTH < 24 HOURS Performed at [facility], [address], [place], [place] [address]    Report Status PENDING  Incomplete    Gardiner Barefoot, MD [email or web address] 03/17/2023, 4:04 PM

## 2023-03-17 NOTE — Progress Notes (Addendum)
The patient expressed concerns over pain management. Pain in BLE 7/10. Hospitalist contacted, V.O received to administer Dilaudid 2 mg PO every 4 hours as needed for severe pain.Order to be modified from Dilaudid 2 mg PO every 6 hour prn.

## 2023-03-17 NOTE — Consult Note (Signed)
WOC Nurse Consult Note: Reason for Consult:LE wounds Patient has been seen per notes by dermatology and has rhematology work up pending. ABIs have not been completed.  Patient failed oral antibiotics for bilateral foot lesions. She has a history of psoriasis per H&P, lesions on the bilateral pretibial areas and knees are not new.   Wound type: Full thickness ulcerations, bilateral dorsal feet; left 3rd and 4th toes, and right lateral heel.  Infectious vs autoimmune vs arterial?  Right dorsal foot; pustulate like lesions all less than 1cm in diameter Right lateral heel: largest wound; punched out appearance, ruddy but mostly clean Left dorsal foot; scabbed lesions, necrotic? Dorsal 3/4th toes Pressure Injury POA:NA Measurement:see above  Wound bed: see above  Drainage (amount, consistency, odor) none documented  Periwound:edema, pain  Dressing procedure/placement/frequency: Cover dorsal foot wounds with single layer of xeroform gauze, top with dry dressing.  Cleanse right lateral heel wound with saline, apply hydrogel [place] # [phone]) to wound bed, top with dry dressing. Wrap with kerlix. Change daily.  Ok to use foam over bilateral pretibial wounds, change every 3 days.   Consider referral to wound care center of patient's choice at DC  Discussed POC with patient and bedside nurse.  Re consult if needed, will not follow at this time. Thanks  Takayla Baillie [employer], RN,CWOCN, CNS, CWON-AP [phone])

## 2023-03-17 NOTE — Progress Notes (Addendum)
Discussed dressing change for BLE wounds per WOC progress note recommendation . Patient appears apprehensive regarding recommendation at this time. She stated that she has had a similar dressing to BLE wounds in the past. She complains of BLE pain and feeling or burning/warmth in BLE and feels the dressing may exacerbate pain.

## 2023-03-17 NOTE — Progress Notes (Signed)
   03/17/23 1911  BiPAP/CPAP/SIPAP  BiPAP/CPAP/SIPAP Pt Type Adult  Reason BIPAP/CPAP not in use Non-compliant

## 2023-03-17 NOTE — Telephone Encounter (Signed)
Forms completed and given to PCP to review and sign.  

## 2023-03-18 DIAGNOSIS — I776 Arteritis, unspecified: Secondary | ICD-10-CM

## 2023-03-18 DIAGNOSIS — L03116 Cellulitis of left lower limb: Secondary | ICD-10-CM | POA: Diagnosis not present

## 2023-03-18 DIAGNOSIS — L03115 Cellulitis of right lower limb: Secondary | ICD-10-CM | POA: Diagnosis not present

## 2023-03-18 LAB — CBC
HCT: 39.3 % (ref 36.0–46.0)
Hemoglobin: 12 g/dL (ref 12.0–15.0)
MCH: 26.1 pg (ref 26.0–34.0)
MCHC: 30.5 g/dL (ref 30.0–36.0)
MCV: 85.4 fL (ref 80.0–100.0)
Platelets: 328 10*3/uL (ref 150–400)
RBC: 4.6 MIL/uL (ref 3.87–5.11)
RDW: 16.8 % — ABNORMAL HIGH (ref 11.5–15.5)
WBC: 11.3 10*3/uL — ABNORMAL HIGH (ref 4.0–10.5)
nRBC: 0 % (ref 0.0–0.2)

## 2023-03-18 LAB — BASIC METABOLIC PANEL
Anion gap: 10 (ref 5–15)
BUN: 17 mg/dL (ref 6–20)
CO2: 31 mmol/L (ref 22–32)
Calcium: 9.9 mg/dL (ref 8.9–10.3)
Chloride: 93 mmol/L — ABNORMAL LOW (ref 98–111)
Creatinine, Ser: 0.8 mg/dL (ref 0.44–1.00)
GFR, Estimated: >60 mL/min (ref >60)
Glucose, Bld: 141 mg/dL — ABNORMAL HIGH (ref 70–99)
Potassium: 4.2 mmol/L (ref 3.5–5.1)
Sodium: 134 mmol/L — ABNORMAL LOW (ref 135–145)

## 2023-03-18 LAB — PHOSPHORUS: Phosphorus: 3 mg/dL (ref 2.5–4.6)

## 2023-03-18 LAB — MAGNESIUM: Magnesium: 2.3 mg/dL (ref 1.7–2.4)

## 2023-03-18 MED ORDER — TRIAMTERENE-HCTZ 37.5-25 MG PO TABS
1.0000 | ORAL_TABLET | Freq: Every day | ORAL | Status: DC
  Administered 2023-03-18 – 2023-03-19 (×2): 1 via ORAL
  Filled 2023-03-18 (×2): qty 1

## 2023-03-18 NOTE — TOC CM/SW Note (Signed)
Transition of Care [facility]) - Inpatient Brief Assessment   Patient Details  Name: [name] MRN: [id number] Date of Birth: Nov 01, 1985  Transition of Care [facility]) CM/SW Contact:    Larrie Kass, LCSW Phone Number: 03/18/2023, 2:37 PM      Transition of Care Asessment: Insurance and Status: Insurance coverage has been reviewed Patient has primary care physician: Yes Home environment has been reviewed: home with self Prior level of function:: independent Prior/Current Home Services: No current home services Social Determinants of Health Reivew: SDOH reviewed no interventions necessary Readmission risk has been reviewed: Yes Transition of care needs: no transition of care needs at this time

## 2023-03-18 NOTE — Progress Notes (Signed)
[facility]   Reason for visit: Follow up on rash  Interval History: started steroids yesterday, some minimal improvement.  Mother at bedside.  Main complaint of pain    Physical Exam: Constitutional:  Vitals:   03/18/23 1112 03/18/23 1157  BP:    Pulse:    Resp: 20 17  Temp:    SpO2:     patient appears in NAD Respiratory: Normal respiratory effort  Review of Systems: Constitutional: negative for fevers and chills  Lab Results  Component Value Date   WBC 11.3 (H) 03/18/2023   HGB 12.0 03/18/2023   HCT 39.3 03/18/2023   MCV 85.4 03/18/2023   PLT 328 03/18/2023    Lab Results  Component Value Date   CREATININE 0.80 03/18/2023   BUN 17 03/18/2023   NA 134 (L) 03/18/2023   K 4.2 03/18/2023   CL 93 (L) 03/18/2023   CO2 31 03/18/2023    Lab Results  Component Value Date   ALT 23 03/17/2023   AST 41 03/17/2023   ALKPHOS 87 03/17/2023     Microbiology: Recent Results (from the past 240 hour(s))  Culture, blood (Routine x 2)     Status: None (Preliminary result)   Collection Time: 03/16/23  2:51 PM   Specimen: BLOOD  Result Value Ref Range Status   Specimen Description   Final    BLOOD RIGHT ANTECUBITAL Performed at [facility], [address] [place], [place] [address]    Special Requests   Final    BOTTLES DRAWN AEROBIC AND ANAEROBIC Blood Culture results may not be optimal due to an excessive volume of blood received in culture bottles Performed at [facility], [address], [place], [place] [address]    Culture   Final    NO GROWTH 2 DAYS Performed at [facility], [address] [place], [place] [address]    Report Status PENDING  Incomplete  Culture, blood (Routine x 2)     Status: None (Preliminary result)   Collection Time: 03/16/23  2:57 PM   Specimen: BLOOD  Result Value Ref Range Status   Specimen Description   Final    BLOOD LEFT ANTECUBITAL Performed at [facility], [address], [place], [place] [address]    Special Requests   Final    BOTTLES DRAWN AEROBIC AND ANAEROBIC Blood Culture results may not be optimal due to an excessive volume of blood received in culture bottles Performed at [facility], [address] [place], [place] [address]    Culture   Final    NO GROWTH 2 DAYS Performed at [facility], [address], [place], [place] [address]    Report Status PENDING  Incomplete    Impression/Plan:  1. Rash - ? Vasculitis.  I have started steroids and she can continue with a prolonged taper of oral steroids after discharge.   She will need follow up with rheumatology.   She should see her dermatologist as well, consider a biopsy.    I will sign off, call with questions.    I have personally spent 55 minutes involved in face-to-face and non-face-to-face activities for this patient on the day of the visit. Professional time spent includes the following activities: Preparing to see the patient (review of tests), Obtaining and/or reviewing separately obtained history (admission/discharge record), Performing a medically appropriate examination and/or evaluation , Ordering medications/tests/procedures, referring and communicating with other  health care professionals, Documenting clinical information in the EMR, Independently interpreting results (not separately reported), Communicating results to the patient/family/caregiver, Counseling and educating the patient/family/caregiver and Care coordination (not separately reported).

## 2023-03-18 NOTE — Progress Notes (Signed)
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  She has seen her PCP who gave her a trial of prednisone thinking that this might be vasculitis.  Patient denies any fever. Some of the wounds have purulent discharge.  Pertinent labs include pregnancy test negative, serum potassium 2.0 other labs were within normal range.  Patient has recently done immunologic panel showing positive ANA titer and increased immunoglobulin. Bilateral feet x-ray no acute osseous abnormality. Patient was started on empiric antibiotics (ceftriaxone, vancomycin and started on IV hydration and IV magnesium and IV potassium chloride replacement.  Assessment & Plan:   Principal Problem:   Bilateral lower leg cellulitis Active Problems:   Primary hypertension   Obstructive sleep apnea syndrome   Asthmatic bronchitis , chronic   Hypokalemia   GAD (generalized anxiety disorder)  Bilateral lower leg rash: Patient presented with bilateral patchy spots on dorsal aspect of both feet. Differential could be broad, cellulitis, vasculitis, drug reaction. Initiated on IV antibiotics (ceftriaxone and  Vancomycin) for possible cellulitis. Continue IV fluid resuscitation.  Continue Analgesics as needed. Cultures so far negative. Wound care consult. Infectious disease consulted,  recommended to discontinue antibiotics,  does not appear cellulitis. Continue steroids, needs skin biopsy, check cryoglobulin,  rheumatoid  factor , C3-C4.  Hypokalemia: Replaced. Continue to monitor   Essential hypertension: Continue torsemide, triamterene, HCTZ   Obstructive sleep apnea syndrome CPAP at bedtime.   Asthmatic bronchitis , chronic Levalbuterol as needed.   GAD (generalized anxiety disorder) Continue Cymbalta.  History of psoriasis: Continue triamterene local.  Bilateral leg edema Continue torsemide 20 mg daily:  Peripheral neuropathy Continue Cymbalta. Continue Dilaudid as needed for pain control.  DVT prophylaxis:Lovenox Code Status: Full code Family Communication:Mother at bed side. Disposition Plan:    Status is: Inpatient Remains inpatient appropriate because: Admitted for suspected cellulitis in the setting of chronic wound.    Consultants:  Infectious diseases.  Procedures: None  Antimicrobials:  Anti-infectives (From admission, onward)    Start     Dose/Rate Route Frequency Ordered Stop   03/17/23 1300  cefTRIAXone (ROCEPHIN) 2 g in sodium chloride 0.9 % 100 mL IVPB  Status:  Discontinued        2 g 200 mL/hr over 30 Minutes Intravenous Every 24 hours 03/16/23 1653 03/17/23 1445   03/17/23 0000  vancomycin (VANCOREADY) IVPB 1250 mg/250 mL  Status:  Discontinued        1,250 mg 166.7 mL/hr over 90 Minutes Intravenous Every 12 hours 03/16/23 1702 03/17/23 1445   03/16/23 1345  vancomycin (VANCOREADY) IVPB 2000 mg/400 mL        2,000 mg 200 mL/hr over 120 Minutes Intravenous  Once 03/16/23 1334 03/16/23 1702   03/16/23 1330  cefTRIAXone (ROCEPHIN) 2 g in sodium chloride 0.9 % 100 mL IVPB        2 g 200 mL/hr over 30 Minutes Intravenous  Once 03/16/23 1315 03/16/23 1554       Subjective: Patient was seen and examined at bedside. Overnight events noted.   Patient reports feeling  better, pain is manageable with pain control.  Objective: Vitals:   03/18/23 0820 03/18/23 0842 03/18/23 0927 03/18/23 1112  BP: (!) 145/96     Pulse: 96     Resp: 16 19 17 20   Temp: 98.5 F  (36.9 C)     TempSrc: Oral     SpO2: 98%     Weight:      Height:        Intake/Output Summary (Last 24 hours) at 03/18/2023 1211 Last data filed at 03/17/2023 2330 Gross per 24 hour  Intake 1253.66 ml  Output --  Net 1253.66 ml   Filed Weights   03/16/23 1152  Weight: 125.2 kg    Examination:  General exam: Appears calm and comfortable, not in any acute distress Respiratory system: CTA bilaterally. Respiratory effort normal.  RR 15 Cardiovascular system: S1 & S2 heard, regular rate and rhythm, no murmur.  Edema+ Gastrointestinal system: Abdomen is non distended, soft and non tender. Normal bowel sounds heard. Central nervous system: Alert and oriented x 3. No focal neurological deficits. Extremities: Edema+, no cyanosis, no clubbing Skin: Multiple wounds in both extremities and pretibial area in both feet Psychiatry: Judgement and insight appear normal. Mood & affect appropriate.     Data Reviewed: I have personally reviewed following labs and imaging studies  CBC: Recent Labs  Lab 03/16/23 1451 03/17/23 0434 03/18/23 0434  WBC 10.8* 9.5 11.3*  NEUTROABS 8.2*  --   --   HGB 12.2 12.2 12.0  HCT 39.7 40.2 39.3  MCV 84.5 87.4 85.4  PLT 309 290 328   Basic Metabolic Panel: Recent Labs  Lab 03/16/23 1451 03/17/23 0434 03/18/23 0434  NA 136 135 134*  K <2.0* 2.8* 4.2  CL 85* 92* 93*  CO2 35* 30 31  GLUCOSE 85 91 141*  BUN 23* 18 17  CREATININE 0.88 0.89 0.80  CALCIUM 10.4* 9.4 9.9  MG 2.3  --  2.3  PHOS 3.2  --  3.0   GFR: Estimated Creatinine Clearance: 136.5 mL/min (by C-G formula based on SCr of 0.8 mg/dL). Liver Function Tests: Recent Labs  Lab 03/16/23 1451 03/17/23 0434  AST 44* 41  ALT 25 23  ALKPHOS 90 87  BILITOT 0.8 0.7  PROT 9.1* 8.3*  ALBUMIN 3.8 3.6   No results for input(s): "LIPASE", "AMYLASE" in the last 168 hours. No results for input(s): "AMMONIA" in the last 168 hours. Coagulation Profile: Recent Labs  Lab 03/16/23 1451   INR 1.0   Cardiac Enzymes: No results for input(s): "CKTOTAL", "CKMB", "CKMBINDEX", "TROPONINI" in the last 168 hours. BNP (last 3 results) Recent Labs    02/12/23 1427  PROBNP 75.0   HbA1C: No results for input(s): "HGBA1C" in the last 72 hours. CBG: No results for input(s): "GLUCAP" in the last 168 hours. Lipid Profile: No results for input(s): "CHOL", "HDL", "LDLCALC", "TRIG", "CHOLHDL", "LDLDIRECT" in the last 72 hours. Thyroid Function Tests: No results for input(s): "TSH", "T4TOTAL", "FREET4", "T3FREE", "THYROIDAB" in the last 72 hours. Anemia Panel: No results for input(s): "VITAMINB12", "FOLATE", "FERRITIN", "TIBC", "IRON", "RETICCTPCT" in the last 72 hours. Sepsis Labs: No results for input(s): "PROCALCITON", "LATICACIDVEN" in the last 168 hours.  Recent Results (from the past 240 hour(s))  Culture, blood (Routine x 2)     Status: None (Preliminary result)   Collection Time: 03/16/23  2:51 PM   Specimen: BLOOD  Result Value Ref Range Status   Specimen Description   Final    BLOOD RIGHT ANTECUBITAL  Performed at [facility], [address], [place], [place] [address]    Special Requests   Final    BOTTLES DRAWN AEROBIC AND ANAEROBIC Blood Culture results may not be optimal due to an excessive volume of blood received in culture bottles Performed at [facility], [address] [place], [place] [address]    Culture   Final    NO GROWTH 2 DAYS Performed at [facility], [address] [place], [place] [address]    Report Status PENDING  Incomplete  Culture, blood (Routine x 2)     Status: None (Preliminary result)   Collection Time: 03/16/23  2:57 PM   Specimen: BLOOD  Result Value Ref Range Status   Specimen Description   Final    BLOOD LEFT ANTECUBITAL Performed at [facility], [address] [place], [place] [address]    Special Requests   Final    BOTTLES DRAWN AEROBIC AND ANAEROBIC Blood  Culture results may not be optimal due to an excessive volume of blood received in culture bottles Performed at [facility], [address] [place], [place] [address]    Culture   Final    NO GROWTH 2 DAYS Performed at [facility], [address] [place], [place] [address]    Report Status PENDING  Incomplete    Radiology Studies: DG Foot Complete Left  Result Date: 03/16/2023 CLINICAL DATA:  Bilateral foot swelling EXAM: LEFT FOOT - COMPLETE 3+ VIEW COMPARISON:  None Available. FINDINGS: There is no evidence of fracture or dislocation. There is no evidence of arthropathy or other focal bone abnormality. S soft tissue swelling without emphysema. IMPRESSION: No acute osseous abnormality Electronically Signed   By: Jasmine Pang M.D.   On: 03/16/2023 15:27   DG Foot Complete Right  Result Date: 03/16/2023 CLINICAL DATA:  Bilateral foot swelling for 1 month neuropathy open blisters EXAM: RIGHT FOOT COMPLETE - 3+ VIEW COMPARISON:  None Available. FINDINGS: No fracture or malalignment. No periostitis or osseous destructive change. Small plantar calcaneal spur IMPRESSION: No acute osseous abnormality. Electronically Signed   By: Jasmine Pang M.D.   On: 03/16/2023 15:20    Scheduled Meds:  DULoxetine  20 mg Oral Daily   enoxaparin (LOVENOX) injection  60 mg Subcutaneous Q24H   methylPREDNISolone (SOLU-MEDROL) injection  125 mg Intravenous Q24H   torsemide  20 mg Oral Daily   triamcinolone 0.1 % cream : eucerin   Topical BID   triamterene-hydrochlorothiazide  1 tablet Oral Daily   Continuous Infusions:     LOS: 2 days    Time spent: 35 mins  Willeen Niece, MD [facility]   If 7PM-7AM, please contact night-coverage

## 2023-03-18 NOTE — Progress Notes (Signed)
   03/18/23 1932  BiPAP/CPAP/SIPAP  BiPAP/CPAP/SIPAP Pt Type Adult  Reason BIPAP/CPAP not in use Non-compliant

## 2023-03-18 NOTE — Plan of Care (Signed)

## 2023-03-19 DIAGNOSIS — L03116 Cellulitis of left lower limb: Secondary | ICD-10-CM | POA: Diagnosis not present

## 2023-03-19 DIAGNOSIS — Z0279 Encounter for issue of other medical certificate: Secondary | ICD-10-CM

## 2023-03-19 DIAGNOSIS — L03115 Cellulitis of right lower limb: Secondary | ICD-10-CM | POA: Diagnosis not present

## 2023-03-19 LAB — C3 COMPLEMENT: C3 Complement: 225 mg/dL — ABNORMAL HIGH (ref 82–167)

## 2023-03-19 LAB — C4 COMPLEMENT: Complement C4, Body Fluid: 36 mg/dL (ref 12–38)

## 2023-03-19 LAB — RHEUMATOID FACTOR: Rheumatoid fact SerPl-aCnc: 11.7 [IU]/mL (ref <14.0)

## 2023-03-19 MED ORDER — PREDNISONE 10 MG PO TABS: 10.0000 mg | ORAL_TABLET | Freq: Every day | ORAL | Status: DC

## 2023-03-19 MED ORDER — PREDNISONE 50 MG PO TABS: 50.0000 mg | ORAL_TABLET | Freq: Every day | ORAL | Status: DC

## 2023-03-19 MED ORDER — OXYCODONE HCL 5 MG PO TABS: 5.0000 mg | ORAL_TABLET | ORAL | 0 refills | Status: AC | PRN

## 2023-03-19 MED ORDER — PREDNISONE 20 MG PO TABS: 30.0000 mg | ORAL_TABLET | Freq: Every day | ORAL | Status: DC

## 2023-03-19 MED ORDER — PREDNISONE 10 MG PO TABS: ORAL_TABLET | ORAL | 0 refills | Status: AC

## 2023-03-19 MED ORDER — PREDNISONE 50 MG PO TABS
60.0000 mg | ORAL_TABLET | Freq: Every day | ORAL | Status: DC
  Administered 2023-03-19: 60 mg via ORAL
  Filled 2023-03-19: qty 1

## 2023-03-19 MED ORDER — PREDNISONE 20 MG PO TABS: 20.0000 mg | ORAL_TABLET | Freq: Every day | ORAL | Status: DC

## 2023-03-19 MED ORDER — PREDNISONE 5 MG PO TABS: 5.0000 mg | ORAL_TABLET | Freq: Every day | ORAL | Status: DC

## 2023-03-19 MED ORDER — PREDNISONE 20 MG PO TABS: 40.0000 mg | ORAL_TABLET | Freq: Every day | ORAL | Status: DC

## 2023-03-19 NOTE — Discharge Summary (Signed)
Physician Discharge Summary  [name] ZOX:[id number] DOB: 37-Oct-1987 DOA: 03/16/2023  PCP: Etta Grandchild, MD  Admit date: 03/16/2023  Discharge date: 03/19/2023  Admitted From: Home  Disposition:  Home  Recommendations for Outpatient Follow-up:  Follow up with PCP in 1-2 weeks. Please obtain BMP/CBC in one week. Advised to follow-up with dermatology as scheduled. Patient will need skin biopsy for lesions on the feet. Advised to take prednisone taper as directed. Patient has appointment with rheumatology for possible vasculitis.  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  She has seen her PCP who gave her a trial of prednisone thinking that this might be vasculitis.  Patient denies any fever. Some of the wounds have purulent discharge.  Pertinent labs include pregnancy test negative, serum potassium 2.0,  other labs were within normal range.  Patient has recently done immunologic panel showing positive ANA titer and increased immunoglobulin. Bilateral feet x-ray no acute osseous abnormality. Patient was started on empiric antibiotics (ceftriaxone, vancomycin and started on IV hydration and IV magnesium and IV potassium chloride replacement).  He was initially treated the cellulitis.  Infectious disease was consulted recommended it does not appear infection.  Or cellulitis antibiotics discontinued.  Recommended skin biopsy and  follow-up with dermatology and rheumatology.  Patient will need prolonged steroid taper.  Patient feels better, wants to be discharged.  Patient being discharged home on prednisone taper.    Discharge Diagnoses:  Principal Problem:   Bilateral lower leg cellulitis Active Problems:   Primary hypertension   Obstructive sleep apnea syndrome   Asthmatic bronchitis , chronic   Hypokalemia   GAD (generalized anxiety disorder)  Bilateral lower leg rash: Patient presented with bilateral patchy spots on dorsal aspect of both feet. Differential could be broad, cellulitis, vasculitis, drug reaction. Initiated on IV antibiotics (ceftriaxone and  Vancomycin) for possible cellulitis. Continue IV fluid resuscitation. Continue Analgesics as needed. Cultures so far negative. Wound care consult. Infectious disease consulted,  recommended to discontinue antibiotics,  does not appear cellulitis. Continue steroids, needs skin biopsy, check cryoglobulin,  rheumatoid factor , C3-C4.   Hypokalemia: Replaced. Continue to monitor   Essential hypertension: Continue torsemide, triamterene, HCTZ   Obstructive sleep apnea syndrome CPAP at bedtime.   Asthmatic bronchitis , chronic Levalbuterol as needed.   GAD (generalized anxiety disorder) Continue Cymbalta.   History of psoriasis: Continue triamterene local.   Bilateral leg edema Continue torsemide 20 mg daily:   Peripheral neuropathy Continue Cymbalta. Continue Dilaudid as needed for pain control.    Discharge Instructions  Discharge Instructions     Call MD for:  difficulty breathing, headache or visual disturbances   Complete by: As directed    Call MD for:  persistant dizziness or light-headedness   Complete by: As directed    Call MD for:  persistant nausea and vomiting   Complete by: As directed    Diet - low sodium heart healthy   Complete by: As directed    Diet Carb Modified   Complete by: As directed    Discharge  instructions   Complete by: As directed    Advised to follow-up with primary care physician in 1 week. Advised to follow-up with dermatology as scheduled. Patient need skin biopsy for lesions on the feet. Vies to take prednisone taper as directed. Patient has appointment with rheumatology for possible vasculitis.   Increase activity slowly   Complete by: As directed    No wound care   Complete by: As directed       Allergies as of 03/19/2023       Reactions   Lyrica [pregabalin] Swelling, Rash, Other (See Comments)   Swelling is in the feet        Medication List     STOP taking these medications    escitalopram 10 MG tablet Commonly known as: Lexapro   lidocaine 5 % ointment Commonly known as: XYLOCAINE       TAKE these medications    ACCRUFeR 30 MG Caps Generic drug: Ferric Maltol Take 1 capsule (30 mg total) by mouth in the morning and at bedtime.   doxepin 50 MG capsule Commonly known as: SINEQUAN Take 50 mg by mouth at bedtime as needed (sleep).   DULoxetine 20 MG capsule Commonly known as: CYMBALTA Take 20 mg by mouth daily.   folic acid 1 MG tablet Commonly known as: FOLVITE Take 1 tablet (1 mg total) by mouth daily.   HYDROmorphone 2 MG tablet Commonly known as: Dilaudid Take 1 tablet (2 mg total) by mouth every 6 (six) hours as needed for severe pain. What changed:  when to take this reasons to take this   levalbuterol 45 MCG/ACT inhaler Commonly known as: XOPENEX HFA INHALE 2 PUFFS INTO THE LUNGS EVERY 6 HOURS AS NEEDED FOR WHEEZE   nebivolol 10 MG tablet Commonly known as: BYSTOLIC Take 1 tablet (10 mg total) by mouth daily.   oxyCODONE 5 MG immediate release tablet Commonly known as: Oxy IR/ROXICODONE Take 1 tablet (5 mg total) by mouth every 4 (four) hours as needed for up to 5 days for breakthrough pain.   potassium chloride 10 MEQ tablet Commonly known as: Klor-Con M10 Take 1 tablet (10 mEq total) by mouth 2 (two) times  daily.   prazosin 1 MG capsule Commonly known as: MINIPRESS Take 1 mg by mouth daily.   predniSONE 10 MG tablet Commonly known as: DELTASONE Take 6 tablets (60 mg total) by mouth daily with breakfast for 7 days, THEN 5 tablets (50 mg total) daily with breakfast for 7 days, THEN 4 tablets (40 mg total) daily with breakfast for 7 days, THEN 3 tablets (30 mg total) daily with breakfast for 7 days, THEN 2 tablets (20 mg total) daily with breakfast for 7 days, THEN 1 tablet (10 mg total) daily with breakfast for 7 days, THEN 0.5 tablets (5 mg total) daily with breakfast for 7 days. Start taking on: March 19, 2023 What changed:  medication strength See the new instructions.   Taltz 80 MG/ML Sosy Generic drug: Ixekizumab Inject 80 mg into the skin every 30 (thirty) days.   torsemide 20 MG tablet Commonly known as: DEMADEX Take 1 tablet (20 mg total) by mouth daily.   traMADol 100 MG 24 hr tablet Commonly known as: ULTRAM-ER Take 200 mg by mouth as needed for pain.   triamterene-hydrochlorothiazide 37.5-25 MG capsule Commonly known as: DYAZIDE Take 1 each (1 capsule total) by mouth daily.   Vitamin D (Ergocalciferol) 1.25 MG (50000 UNIT) Caps capsule Commonly known as: DRISDOL Take 1 capsule (50,000  Units total) by mouth every 7 (seven) days.   zinc gluconate 50 MG tablet Take 1 tablet (50 mg total) by mouth daily.               Durable Medical Equipment  (From admission, onward)           Start     Ordered   03/19/23 1234  For home use only DME 3 n 1  Once        03/19/23 1233   03/19/23 1234  For home use only DME Walker  Once       Question:  Patient needs a walker to treat with the following condition  Answer:  Severe pain   03/19/23 1233            Follow-up Information     Etta Grandchild, MD Follow up in 1 week(s).   Specialty: Internal Medicine Contact information: [address] [place] [address] [phone]         [facility]              Follow up.   Contact information: [address] [place] [address] [phone]               Allergies  Allergen Reactions   Lyrica [Pregabalin] Swelling, Rash and Other (See Comments)    Swelling is in the feet    Consultations: Infectious Diseases   Procedures/Studies: DG Foot Complete Left  Result Date: 03/16/2023 CLINICAL DATA:  Bilateral foot swelling EXAM: LEFT FOOT - COMPLETE 3+ VIEW COMPARISON:  None Available. FINDINGS: There is no evidence of fracture or dislocation. There is no evidence of arthropathy or other focal bone abnormality. S soft tissue swelling without emphysema. IMPRESSION: No acute osseous abnormality Electronically Signed   By: Jasmine Pang M.D.   On: 03/16/2023 15:27   DG Foot Complete Right  Result Date: 03/16/2023 CLINICAL DATA:  Bilateral foot swelling for 1 month neuropathy open blisters EXAM: RIGHT FOOT COMPLETE - 3+ VIEW COMPARISON:  None Available. FINDINGS: No fracture or malalignment. No periostitis or osseous destructive change. Small plantar calcaneal spur IMPRESSION: No acute osseous abnormality. Electronically Signed   By: Jasmine Pang M.D.   On: 03/16/2023 15:20      Subjective: Patient was seen and examined at bedside.  Overnight events noted.   Patient reports doing better and wants to be discharged.  Pain is there but manageable.   Patient will follow-up with dermatology and rheumatology as scheduled.  Needs  skin biopsy.  Discharge Exam: Vitals:   03/19/23 1409 03/19/23 1433  BP:  (!) 129/97  Pulse:  99  Resp: 20 19  Temp:  98.5 F (36.9 C)  SpO2:  100%   Vitals:   03/19/23 0942 03/19/23 1136 03/19/23 1409 03/19/23 1433  BP:    (!) 129/97  Pulse:    99  Resp: 20 20 20 19   Temp:    98.5 F (36.9 C)  TempSrc:    Oral  SpO2:    100%  Weight:      Height:        General: Pt is alert, awake, not in acute distress Cardiovascular:  RRR, S1/S2 +, no rubs, no gallops Respiratory: CTA bilaterally, no wheezing, no rhonchi Abdominal: Soft, NT, ND, bowel sounds + Extremities: no edema, no cyanosis    The results of significant diagnostics from this hospitalization (including imaging, microbiology, ancillary and laboratory) are  listed below for reference.     Microbiology: Recent Results (from the past 240 hour(s))  Culture, blood (Routine x 2)     Status: None (Preliminary result)   Collection Time: 03/16/23  2:51 PM   Specimen: BLOOD  Result Value Ref Range Status   Specimen Description   Final    BLOOD RIGHT ANTECUBITAL Performed at [facility], [address], [place], [place] [address]    Special Requests   Final    BOTTLES DRAWN AEROBIC AND ANAEROBIC Blood Culture results may not be optimal due to an excessive volume of blood received in culture bottles Performed at [facility], [address] [place], [place] [address]    Culture   Final    NO GROWTH 3 DAYS Performed at [facility], [address] [place], [place] [address]    Report Status PENDING  Incomplete  Culture, blood (Routine x 2)     Status: None (Preliminary result)   Collection Time: 03/16/23  2:57 PM   Specimen: BLOOD  Result Value Ref Range Status   Specimen Description   Final    BLOOD LEFT ANTECUBITAL Performed at [facility], [address], [place], [place] [address]    Special Requests   Final    BOTTLES DRAWN AEROBIC AND ANAEROBIC Blood Culture results may not be optimal due to an excessive volume of blood received in culture bottles Performed at [facility], [address], [place], [place] [address]    Culture   Final    NO GROWTH 3 DAYS Performed at [facility], [address] [place], [place] [address]    Report Status PENDING  Incomplete     Labs: BNP (last 3 results) No results for input(s): "BNP" in the last 8760  hours. Basic Metabolic Panel: Recent Labs  Lab 03/16/23 1451 03/17/23 0434 03/18/23 0434  NA 136 135 134*  K <2.0* 2.8* 4.2  CL 85* 92* 93*  CO2 35* 30 31  GLUCOSE 85 91 141*  BUN 23* 18 17  CREATININE 0.88 0.89 0.80  CALCIUM 10.4* 9.4 9.9  MG 2.3  --  2.3  PHOS 3.2  --  3.0   Liver Function Tests: Recent Labs  Lab 03/16/23 1451 03/17/23 0434  AST 44* 41  ALT 25 23  ALKPHOS 90 87  BILITOT 0.8 0.7  PROT 9.1* 8.3*  ALBUMIN 3.8 3.6   No results for input(s): "LIPASE", "AMYLASE" in the last 168 hours. No results for input(s): "AMMONIA" in the last 168 hours. CBC: Recent Labs  Lab 03/16/23 1451 03/17/23 0434 03/18/23 0434  WBC 10.8* 9.5 11.3*  NEUTROABS 8.2*  --   --   HGB 12.2 12.2 12.0  HCT 39.7 40.2 39.3  MCV 84.5 87.4 85.4  PLT 309 290 328   Cardiac Enzymes: No results for input(s): "CKTOTAL", "CKMB", "CKMBINDEX", "TROPONINI" in the last 168 hours. BNP: Invalid input(s): "POCBNP" CBG: No results for input(s): "GLUCAP" in the last 168 hours. D-Dimer No results for input(s): "DDIMER" in the last 72 hours. Hgb A1c No results for input(s): "HGBA1C" in the last 72 hours. Lipid Profile No results for input(s): "CHOL", "HDL", "LDLCALC", "TRIG", "CHOLHDL", "LDLDIRECT" in the last 72 hours. Thyroid function studies No results for input(s): "TSH", "T4TOTAL", "T3FREE", "THYROIDAB" in the last 72 hours.  Invalid input(s): "FREET3" Anemia work up No results for input(s): "VITAMINB12", "FOLATE", "FERRITIN", "TIBC", "IRON", "RETICCTPCT" in the last 72 hours. Urinalysis  Component Value Date/Time   COLORURINE YELLOW 03/16/2023 1602   APPEARANCEUR CLEAR 03/16/2023 1602   LABSPEC 1.009 03/16/2023 1602   PHURINE 6.0 03/16/2023 1602   GLUCOSEU NEGATIVE 03/16/2023 1602   GLUCOSEU NEGATIVE 02/12/2023 1427   HGBUR NEGATIVE 03/16/2023 1602   BILIRUBINUR NEGATIVE 03/16/2023 1602   BILIRUBINUR NEGATIVE 06/08/2013 1917   KETONESUR NEGATIVE 03/16/2023 1602   PROTEINUR  NEGATIVE 03/16/2023 1602   UROBILINOGEN 2.0 (A) 02/12/2023 1427   NITRITE POSITIVE (A) 03/16/2023 1602   LEUKOCYTESUR TRACE (A) 03/16/2023 1602   Sepsis Labs Recent Labs  Lab 03/16/23 1451 03/17/23 0434 03/18/23 0434  WBC 10.8* 9.5 11.3*   Microbiology Recent Results (from the past 240 hour(s))  Culture, blood (Routine x 2)     Status: None (Preliminary result)   Collection Time: 03/16/23  2:51 PM   Specimen: BLOOD  Result Value Ref Range Status   Specimen Description   Final    BLOOD RIGHT ANTECUBITAL Performed at [facility], [address] [place], [place] [address]    Special Requests   Final    BOTTLES DRAWN AEROBIC AND ANAEROBIC Blood Culture results may not be optimal due to an excessive volume of blood received in culture bottles Performed at [facility], [address] [place], [place] [address]    Culture   Final    NO GROWTH 3 DAYS Performed at [facility], [address], [place], [place] [address]    Report Status PENDING  Incomplete  Culture, blood (Routine x 2)     Status: None (Preliminary result)   Collection Time: 03/16/23  2:57 PM   Specimen: BLOOD  Result Value Ref Range Status   Specimen Description   Final    BLOOD LEFT ANTECUBITAL Performed at [facility], [address] [place], [place] [address]    Special Requests   Final    BOTTLES DRAWN AEROBIC AND ANAEROBIC Blood Culture results may not be optimal due to an excessive volume of blood received in culture bottles Performed at [facility], [address] [place], [place] [address]    Culture   Final    NO GROWTH 3 DAYS Performed at [facility], [address], [place], [place] [address]    Report Status PENDING  Incomplete     Time coordinating discharge: Over 30 minutes  SIGNED:   Willeen Niece, MD  [facility] 03/19/2023, 3:07 PM Pager   If 7PM-7AM, please contact  night-coverage

## 2023-03-19 NOTE — Plan of Care (Signed)

## 2023-03-19 NOTE — Discharge Instructions (Addendum)
Advised to follow-up with primary care physician in 1 week. Advised to follow-up with dermatology as scheduled. Patient need skin biopsy for lesions on the feet. Vies to take prednisone taper as directed. Patient has appointment with rheumatology for possible vasculitis. Call [facility] to schedule appointment

## 2023-03-19 NOTE — TOC Transition Note (Signed)
Transition of Care [facility]) - CM/SW Discharge Note   Patient Details  Name: [name] MRN: [id number] Date of Birth: 1985/12/03  Transition of Care [facility] CM/SW Contact:  Larrie Kass, LCSW Phone Number: 03/19/2023, 11:56 AM   Clinical Narrative:     CSW received a message from pt's RN , pt is requesting DME, CSW sent a referral to [facility] for delivery to pt's room before d/c. Pt is requesting an appointment at [facility], I attempted to arrange an appointment, but the center is closed. CSW has attached [facility] for pt to follow-up. No further TOC needs   Patient Goals and CMS Choice      Discharge Placement                         Discharge Plan and Services Additional resources added to the After Visit Summary for                                       Social Determinants of Health (SDOH) Interventions SDOH Screenings   Food Insecurity: No Food Insecurity (03/17/2023)  Recent Concern: Food Insecurity - Food Insecurity Present (02/01/2023)  Housing: Low Risk  (03/17/2023)  Transportation Needs: No Transportation Needs (03/17/2023)  Utilities: Not At Risk (03/17/2023)  Alcohol Screen: Medium Risk (02/01/2023)  Depression (PHQ2-9): Medium Risk (02/12/2023)  Financial Resource Strain: High Risk (02/01/2023)  Physical Activity: Unknown (02/01/2023)  Social Connections: Socially Isolated (02/01/2023)  Stress: Stress Concern Present (02/01/2023)  Tobacco Use: Low Risk  (03/17/2023)     Readmission Risk Interventions     No data to display

## 2023-03-21 LAB — CULTURE, BLOOD (ROUTINE X 2)
Culture: NO GROWTH
Culture: NO GROWTH

## 2023-03-22 ENCOUNTER — Encounter: Payer: Self-pay | Admitting: *Deleted

## 2023-03-22 ENCOUNTER — Telehealth: Payer: Self-pay | Admitting: *Deleted

## 2023-03-22 DIAGNOSIS — I872 Venous insufficiency (chronic) (peripheral): Secondary | ICD-10-CM | POA: Diagnosis not present

## 2023-03-22 DIAGNOSIS — I890 Lymphedema, not elsewhere classified: Secondary | ICD-10-CM | POA: Diagnosis not present

## 2023-03-22 NOTE — Transitions of Care (Post Inpatient/ED Visit) (Signed)
03/22/2023  Name: [name] MRN: [id number] DOB: 1985-08-01  Today's TOC FU Call Status: Today's TOC FU Call Status:: Unsuccessful Call (1st Attempt) Unsuccessful Call (1st Attempt) Date: 03/22/23  Attempted to reach the patient regarding the most recent Inpatient visit; received automated outgoing message stating, "the person you are trying to reach has a voice mail box that is full and cannot accept any messages at this time;" call automatically ended; unable to lave voice message requesting call-back   Follow Up Plan: Additional outreach attempts will be made to reach the patient to complete the Transitions of Care (Post Inpatient visit) call.   Caryl Pina, RN, BSN, CCRN Alumnus RN CM Care Coordination/ Transition of Care- [facility] Care Management [phone]: direct office

## 2023-03-22 NOTE — Telephone Encounter (Signed)
Forms have been signed and faxed back to employer.

## 2023-03-23 ENCOUNTER — Telehealth: Payer: Self-pay | Admitting: *Deleted

## 2023-03-23 ENCOUNTER — Encounter (HOSPITAL_BASED_OUTPATIENT_CLINIC_OR_DEPARTMENT_OTHER): Payer: BC Managed Care – PPO | Attending: Internal Medicine | Admitting: Internal Medicine

## 2023-03-23 DIAGNOSIS — I10 Essential (primary) hypertension: Secondary | ICD-10-CM | POA: Insufficient documentation

## 2023-03-23 DIAGNOSIS — L409 Psoriasis, unspecified: Secondary | ICD-10-CM | POA: Diagnosis not present

## 2023-03-23 DIAGNOSIS — Z6841 Body Mass Index (BMI) 40.0 and over, adult: Secondary | ICD-10-CM | POA: Insufficient documentation

## 2023-03-23 DIAGNOSIS — E669 Obesity, unspecified: Secondary | ICD-10-CM | POA: Insufficient documentation

## 2023-03-23 DIAGNOSIS — L97528 Non-pressure chronic ulcer of other part of left foot with other specified severity: Secondary | ICD-10-CM | POA: Insufficient documentation

## 2023-03-23 DIAGNOSIS — G8929 Other chronic pain: Secondary | ICD-10-CM | POA: Insufficient documentation

## 2023-03-23 DIAGNOSIS — S91302A Unspecified open wound, left foot, initial encounter: Secondary | ICD-10-CM | POA: Diagnosis not present

## 2023-03-23 DIAGNOSIS — E538 Deficiency of other specified B group vitamins: Secondary | ICD-10-CM | POA: Insufficient documentation

## 2023-03-23 DIAGNOSIS — L97518 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 severity: Secondary | ICD-10-CM | POA: Diagnosis not present

## 2023-03-23 DIAGNOSIS — S91301A Unspecified open wound, right foot, initial encounter: Secondary | ICD-10-CM | POA: Diagnosis not present

## 2023-03-23 DIAGNOSIS — I890 Lymphedema, not elsewhere classified: Secondary | ICD-10-CM | POA: Diagnosis not present

## 2023-03-23 NOTE — Transitions of Care (Post Inpatient/ED Visit) (Signed)
03/23/2023  Name: [name] MRN: [id number] DOB: [birth year]  Today's TOC FU Call Status: Today's TOC FU Call Status:: Successful TOC FU Call Completed TOC FU Call Complete Date: 03/23/23 Patient's Name and Date of Birth confirmed.  Transition Care Management Follow-up Telephone Call Date of Discharge: 03/19/23 Discharge Facility: [facility]) Type of Discharge: Inpatient Admission Primary Inpatient Discharge Diagnosis:: Bilateral LE cellulitis How have you been since you were released from the hospital?: Better ("I am doing better now that I am at home; I just have a lot of questions for all of these doctors I am seeing-- I need a real diagnosis, and right now they don't know exactly what is going on; I have a whole list of questions for them") Any questions or concerns?: Yes Patient Questions/Concerns:: Would like more information about her definitive diagnosis post-hospital discharge Patient Questions/Concerns Addressed: Other: (provided positive reinforcement for writing her questions her down and encouraged her to attend all scheduled provider appointments and obtain answers to her questions: she verbalizes understanding/ agreement)  Items Reviewed: Did you receive and understand the discharge instructions provided?: Yes (thoroughly reviewed with patient who verbalizes good understanding of same) Medications obtained,verified, and reconciled?: Partial Review Completed (Partial review completed- patient not currently at home for full review; confirmed patient obtained/ is taking all newly Rx'd medications as instructed; self-manages medications and denies questions/ concerns around medications today) Reason for Partial Mediation Review: Reports she is not currently at home/ not near her medication list Any new allergies since your discharge?: No Dietary orders reviewed?: Yes Type of Diet Ordered:: Regular Do you have support at home?: Yes People in Home: child(ren),  dependent Name of Support/Comfort Primary Source: Reports resides with dependent son 11 y/o; is independent in self-care activities; supportive local mother assists as/ if needed/ indicated  Medications Reviewed Today: Medications Reviewed Today     Reviewed by Michaela Corner, RN (Registered Nurse) on 03/23/23 at 1453  Med List Status: <None>   Medication Order Taking? Sig Documenting Provider Last Dose Status Informant  doxepin (SINEQUAN) 50 MG capsule [id number]  Take 50 mg by mouth at bedtime as needed (sleep). [provider]  Active Self, Pharmacy Records  DULoxetine (CYMBALTA) 20 MG capsule [id number]  Take 20 mg by mouth daily. [provider]  Active Self, Pharmacy Records  Ferric Maltol (ACCRUFER) 30 MG CAPS [id number]  Take 1 capsule (30 mg total) by mouth in the morning and at bedtime. Etta Grandchild, MD  Active Self, Pharmacy Records   Patient not taking:   Discontinued 02/01/19 1822   folic acid (FOLVITE) 1 MG tablet [id number]  Take 1 tablet (1 mg total) by mouth daily. Etta Grandchild, MD  Active Self, Pharmacy Records  HYDROmorphone (DILAUDID) 2 MG tablet [id number]  Take 1 tablet (2 mg total) by mouth every 6 (six) hours as needed for severe pain.  Patient taking differently: Take 2 mg by mouth as needed for severe pain or moderate pain.   Etta Grandchild, MD  Active Self, Pharmacy Records  Ixekizumab (TALTZ) 80 MG/ML SOSY [id number]  Inject 80 mg into the skin every 30 (thirty) days. [provider]  Active Self, Pharmacy Records  levalbuterol [facility] HFA) 45 MCG/ACT inhaler [id number]  INHALE 2 PUFFS INTO THE LUNGS EVERY 6 HOURS AS NEEDED FOR WHEEZE Etta Grandchild, MD  Active Self, Pharmacy Records  nebivolol (BYSTOLIC) 10 MG tablet [id number]  Take 1 tablet (10 mg total) by mouth daily. Etta Grandchild, MD  Active Self, Pharmacy Records  oxyCODONE (OXY IR/ROXICODONE) 5 MG immediate release tablet [id number] Yes Take 1 tablet (5 mg total) by mouth every 4  (four) hours as needed for up to 5 days for breakthrough pain. Willeen Niece, MD Taking Active    Patient not taking:   Discontinued 02/01/19 1822   potassium chloride (KLOR-CON M10) 10 MEQ tablet [id number]  Take 1 tablet (10 mEq total) by mouth 2 (two) times daily. Etta Grandchild, MD  Active Self, Pharmacy Records           Med Note (CRUTHIS, CHLOE C   Wed Mar 17, 2023 10:10 AM) Pt is adamant she is still taking this medication BID. Dispense report does not support this claim.  prazosin (MINIPRESS) 1 MG capsule [id number]  Take 1 mg by mouth daily. [provider]  Active Self, Pharmacy Records  predniSONE (DELTASONE) 10 MG tablet [id number] Yes Take 6 tablets (60 mg total) by mouth daily with breakfast for 7 days, THEN 5 tablets (50 mg total) daily with breakfast for 7 days, THEN 4 tablets (40 mg total) daily with breakfast for 7 days, THEN 3 tablets (30 mg total) daily with breakfast for 7 days, THEN 2 tablets (20 mg total) daily with breakfast for 7 days, THEN 1 tablet (10 mg total) daily with breakfast for 7 days, THEN 0.5 tablets (5 mg total) daily with breakfast for 7 days. Willeen Niece, MD Taking Active   torsemide (DEMADEX) 20 MG tablet [id number]  Take 1 tablet (20 mg total) by mouth daily. Etta Grandchild, MD  Active Self, Pharmacy Records  traMADol (ULTRAM-ER) 100 MG 24 hr tablet [id number] Yes Take 200 mg by mouth as needed for pain. [provider] Taking Active Self, Pharmacy Records  triamterene-hydrochlorothiazide (DYAZIDE) 37.5-25 MG capsule [id number]  Take 1 each (1 capsule total) by mouth daily. Etta Grandchild, MD  Active Self, Pharmacy Records  Vitamin D, Ergocalciferol, (DRISDOL) 1.25 MG (50000 UNIT) CAPS capsule [id number]  Take 1 capsule (50,000 Units total) by mouth every 7 (seven) days. Etta Grandchild, MD  Active Self, Pharmacy Records  zinc gluconate 50 MG tablet [id number]  Take 1 tablet (50 mg total) by mouth daily. Etta Grandchild, MD  Active Self,  Pharmacy Records           Home Care and Equipment/Supplies: Were Home Health Services Ordered?: No Any new equipment or medical supplies ordered?: Yes (BSC and walker) Name of Medical supply agency?: [employer] Were you able to get the equipment/medical supplies?: Yes Do you have any questions related to the use of the equipment/supplies?: No  Functional Questionnaire: Do you need assistance with bathing/showering or dressing?: No Do you need assistance with meal preparation?: No Do you need assistance with eating?: No Do you have difficulty maintaining continence: No Do you need assistance with getting out of bed/getting out of a chair/moving?: No Do you have difficulty managing or taking your medications?: No  Follow up appointments reviewed: PCP Follow-up appointment confirmed?: Yes Date of PCP follow-up appointment?: 03/30/23 (verified this is recommended time frame for follow up per hospital discharging provider notes) Follow-up Provider: PCP Specialist Hospital Follow-up appointment confirmed?: Yes Date of Specialist follow-up appointment?: 03/23/23 Follow-Up Specialty Provider:: confirmed attended wound care clinic provider this morning; also confirmed she has plans to attend cardiology provider appointment scheduled for tomorrow 03/24/23 Do you need transportation to your follow-up appointment?: No Do you understand care options if your condition(s) worsen?: Yes-patient verbalized understanding  SDOH Interventions Today  Flowsheet Row Most Recent Value  SDOH Interventions   Food Insecurity Interventions Intervention Not Indicated  Transportation Interventions Intervention Not Indicated  [reports her mother provides transportation]      TOC Interventions Today    Flowsheet Row Most Recent Value  TOC Interventions   TOC Interventions Discussed/Reviewed TOC Interventions Discussed  [Patient declines need for ongoing/ further care coordination outreach,  offered to  provide my direct contact information should questions/ concerns/ needs arise post-TOC call: patient declined]      Interventions Today    Flowsheet Row Most Recent Value  Chronic Disease   Chronic disease during today's visit Other  [bilateral lower extremit cellulitis]  General Interventions   General Interventions Discussed/Reviewed Durable Medical Equipment (DME), General Interventions Discussed, Doctor Visits  Doctor Visits Discussed/Reviewed Doctor Visits Discussed, Doctor Visits Reviewed, PCP, Specialist  [Reviewed wound clinic visit from this morning with patient who verbalizes good understanding of same]  Durable Medical Equipment (DME) Bed side commode, Dan Humphreys  [confirmed currently requiring/ using assistive devices - walker prn]  Education Interventions   Education Provided Provided Education  Provided Verbal Education On Forensic psychologist of attending provider office visits/ taking her list of questions with her to office visits]  Nutrition Interventions   Nutrition Discussed/Reviewed Nutrition Discussed  Pharmacy Interventions   Pharmacy Dicussed/Reviewed Pharmacy Topics Discussed  Safety Interventions   Safety Discussed/Reviewed Safety Discussed, Fall Risk      Caryl Pina, RN, BSN, CCRN Alumnus RN CM Care Coordination/ Transition of Care- [facility] Care Management [phone]: direct office

## 2023-03-24 ENCOUNTER — Ambulatory Visit: Payer: BC Managed Care – PPO | Attending: Internal Medicine

## 2023-03-24 ENCOUNTER — Ambulatory Visit: Payer: BC Managed Care – PPO

## 2023-03-24 DIAGNOSIS — I110 Hypertensive heart disease with heart failure: Secondary | ICD-10-CM

## 2023-03-24 DIAGNOSIS — M79604 Pain in right leg: Secondary | ICD-10-CM | POA: Diagnosis not present

## 2023-03-24 DIAGNOSIS — M79605 Pain in left leg: Secondary | ICD-10-CM

## 2023-03-24 LAB — ECHOCARDIOGRAM COMPLETE
Area-P 1/2: 2.62 cm2
S' Lateral: 3.2 cm

## 2023-03-24 NOTE — Progress Notes (Signed)
Yes Maceration: No Treatment Notes Wound #4 (Foot) Wound Laterality: Dorsal, Right Cleanser Soap and Water Discharge Instruction: May shower and wash wound with dial antibacterial soap and water prior to dressing change. Vashe 5.8 (oz) Discharge Instruction: Cleanse the wound with Vashe prior to applying a clean dressing using gauze sponges, not tissue or cotton balls. Peri-Wound Care Triamcinolone 15 (g) Discharge Instruction: Use triamcinolone 15 (g) applied liberally. Sween Lotion (Moisturizing lotion) Discharge Instruction: Apply moisturizing lotion as directed Topical Triamcinolone Discharge Instruction: Apply Triamcinolone as directed Primary Dressing Xeroform Occlusive Gauze Dressing, 4x4 in Discharge Instruction: Apply to wound bed as instructed Secondary Dressing ABD Pad, 8x10 Discharge Instruction: Apply over primary dressing as directed. Woven Gauze Sponge, Non-Sterile 4x4 in Discharge Instruction: Apply over primary dressing as directed. Secured With Compression Wrap Urgo K2 Lite, (equivalent to a 3 layer) two layer compression system, regular Discharge Instruction: Apply Urgo K2 Lite as directed (alternative to 3 layer compression). Compression Stockings Add-Ons Electronic Signature(s) Signed: 03/23/2023 5:50:35 PM By: [name] Stall RN, BSN Entered By: [name] on 03/23/2023 05:43:54 [name] ([id number]) [id number] Page 20 of 21 -------------------------------------------------------------------------------- Wound Assessment Details Patient Name: Date of Service: [name] [name]. 03/23/2023 8:00 A M Medical Record Number: [id number] Patient Account Number: [id number] Date of Birth/Sex: Treating RN: Nov 30, 1985 (37 y.o. [name],  [name] Primary Care [name]: [name] Other Clinician: Referring [name]: Treating [name]/Extender: [name] in Treatment: 0 Wound Status Wound Number: 5 Primary Etiology: T be determined o Wound Location: Right, Lateral Calcaneus Wound Status: Open Wounding Event: Gradually Appeared Comorbid History: Anemia, Sleep Apnea, Vasculitis, Neuropathy Date Acquired: 03/09/2023 Weeks Of Treatment: 0 Clustered Wound: No Photos Wound Measurements Length: (cm) 3 Width: (cm) 2.5 Depth: (cm) 0.1 Area: (cm) 5.89 Volume: (cm) 0.589 % Reduction in Area: % Reduction in Volume: Epithelialization: Small (1-33%) Tunneling: No Undermining: No Wound Description Classification: Full Thickness Without Exposed Support Wound Margin: Distinct, outline attached Exudate Amount: Medium Exudate Type: Serosanguineous Exudate Color: red, brown Structures Foul Odor After Cleansing: No Slough/Fibrino No Wound Bed Granulation Amount: Medium (34-66%) Exposed Structure Granulation Quality: Red, Pink Fascia Exposed: No Necrotic Amount: Medium (34-66%) Fat Layer (Subcutaneous Tissue) Exposed: No Necrotic Quality: Adherent Slough Tendon Exposed: No Muscle Exposed: No Joint Exposed: No Bone Exposed: No Periwound Skin Texture Texture Color No Abnormalities Noted: No No Abnormalities Noted: No Callus: No Atrophie Blanche: No Crepitus: No Cyanosis: No Excoriation: No Ecchymosis: No Induration: No Erythema: No Rash: No Hemosiderin Staining: No Scarring: No Mottled: No Pallor: No Moisture Rubor: No No Abnormalities Noted: No Dry / Scaly: Yes Maceration: No Treatment Notes [name] [name] ([id number]) [id number] Page 21 of 21 Wound #5 (Calcaneus) Wound Laterality: Right, Lateral Cleanser Soap and Water Discharge Instruction: May shower and wash wound with dial antibacterial soap and water prior to dressing change. Vashe 5.8  (oz) Discharge Instruction: Cleanse the wound with Vashe prior to applying a clean dressing using gauze sponges, not tissue or cotton balls. Peri-Wound Care Triamcinolone 15 (g) Discharge Instruction: Use triamcinolone 15 (g) applied liberally. Sween Lotion (Moisturizing lotion) Discharge Instruction: Apply moisturizing lotion as directed Topical Triamcinolone Discharge Instruction: Apply Triamcinolone as directed Primary Dressing Xeroform Occlusive Gauze Dressing, 4x4 in Discharge Instruction: Apply to wound bed as instructed Secondary Dressing ABD Pad, 8x10 Discharge Instruction: Apply over primary dressing as directed. Woven Gauze Sponge, Non-Sterile 4x4 in Discharge Instruction: Apply over primary dressing as directed. Secured With Compression Wrap Urgo K2 Lite, (equivalent to a 3 layer)  [name], [name] ([id number]) [id number] Page 1 of 21 Visit Report for 03/23/2023 Allergy List Details Patient Name: Date of Service: [name] MMA [name]. 03/23/2023 8:00 A M Medical Record Number: [id number] Patient Account Number: [id number] Date of Birth/Sex: Treating RN: [birth year] (37 y.o. [name] Primary Care Florena Kozma: [name] Other Clinician: Referring Trenyce Loera: Treating Norman Bier/Extender: [name] in Treatment: 0 Allergies Active Allergies Lyrica Allergy Notes Electronic Signature(s) Signed: 03/23/2023 5:50:35 PM By: [name] Stall RN, BSN Entered By: [name] on 03/22/2023 13:52:35 -------------------------------------------------------------------------------- Arrival Information Details Patient Name: Date of Service: [name] MMA [name]. 03/23/2023 8:00 A M Medical Record Number: [id number] Patient Account Number: [id number] Date of Birth/Sex: Treating RN: 10/27/1985 (37 y.o. [name] Primary Care Jamea Robicheaux: [name] Other Clinician: Referring Tracer Gutridge: Treating Danett Palazzo/Extender: [name] in Treatment: 0 Visit Information Patient Arrived: Danella Maiers Time: 08:22 Accompanied By: mother Transfer Assistance: None Patient Identification Verified: Yes Secondary Verification Process Completed: Yes Patient Requires Transmission-Based Precautions: No Patient Has Alerts: No Electronic Signature(s) Signed: 03/23/2023 5:50:35 PM By: [name] Stall RN, BSN Entered By: [name] on 03/23/2023 05:26:44 -------------------------------------------------------------------------------- Clinic Level of Care Assessment Details Patient Name: Date of Service: [name] MMA [name]. 03/23/2023 8:00 A M Medical Record Number: [id number] Patient Account Number: [id number] [name], [name] ([id number]) [id number] Page 2 of 21 Date of Birth/Sex:  Treating RN: 01-02-1986 (37 y.o. [name] Primary Care Malin Cervini: [name] Other Clinician: Referring Cam Harnden: Treating Zakyria Metzinger/Extender: [name] in Treatment: 0 Clinic Level of Care Assessment Items TOOL 1 Quantity Score X- 1 0 Use when EandM and Procedure is performed on INITIAL visit ASSESSMENTS - Nursing Assessment / Reassessment X- 1 20 General Physical Exam (combine w/ comprehensive assessment (listed just below) when performed on new pt. evals) X- 1 25 Comprehensive Assessment (HX, ROS, Risk Assessments, Wounds Hx, etc.) ASSESSMENTS - Wound and Skin Assessment / Reassessment X- 1 10 Dermatologic / Skin Assessment (not related to wound area) ASSESSMENTS - Ostomy and/or Continence Assessment and Care []  - 0 Incontinence Assessment and Management []  - 0 Ostomy Care Assessment and Management (repouching, etc.) PROCESS - Coordination of Care []  - 0 Simple Patient / Family Education for ongoing care X- 1 20 Complex (extensive) Patient / Family Education for ongoing care X- 1 10 Staff obtains Chiropractor, Records, T Results / Process Orders est []  - 0 Staff telephones HHA, Nursing Homes / Clarify orders / etc []  - 0 Routine Transfer to another Facility (non-emergent condition) []  - 0 Routine Hospital Admission (non-emergent condition) X- 1 15 New Admissions / Manufacturing engineer / Ordering NPWT Apligraf, etc. , []  - 0 Emergency Hospital Admission (emergent condition) PROCESS - Special Needs []  - 0 Pediatric / Minor Patient Management []  - 0 Isolation Patient Management []  - 0 Hearing / Language / Visual special needs []  - 0 Assessment of Community assistance (transportation, D/C planning, etc.) []  - 0 Additional assistance / Altered mentation []  - 0 Support Surface(s) Assessment (bed, cushion, seat, etc.) INTERVENTIONS - Miscellaneous []  - 0 External ear exam []  - 0 Patient Transfer (multiple staff / Nurse, adult /  Similar devices) []  - 0 Simple Staple / Suture removal (25 or less) []  - 0 Complex Staple / Suture removal (26 or more) []  - 0 Hypo/Hyperglycemic Management (do not check if billed separately) X- 1 15 Ankle / Brachial Index (ABI) - do not check if billed separately Has the patient  [name], [name] ([id number]) [id number] Page 1 of 21 Visit Report for 03/23/2023 Allergy List Details Patient Name: Date of Service: [name] MMA [name]. 03/23/2023 8:00 A M Medical Record Number: [id number] Patient Account Number: [id number] Date of Birth/Sex: Treating RN: [birth year] (37 y.o. [name] Primary Care Florena Kozma: [name] Other Clinician: Referring Trenyce Loera: Treating Norman Bier/Extender: [name] in Treatment: 0 Allergies Active Allergies Lyrica Allergy Notes Electronic Signature(s) Signed: 03/23/2023 5:50:35 PM By: [name] Stall RN, BSN Entered By: [name] on 03/22/2023 13:52:35 -------------------------------------------------------------------------------- Arrival Information Details Patient Name: Date of Service: [name] MMA [name]. 03/23/2023 8:00 A M Medical Record Number: [id number] Patient Account Number: [id number] Date of Birth/Sex: Treating RN: 10/27/1985 (37 y.o. [name] Primary Care Jamea Robicheaux: [name] Other Clinician: Referring Tracer Gutridge: Treating Danett Palazzo/Extender: [name] in Treatment: 0 Visit Information Patient Arrived: Danella Maiers Time: 08:22 Accompanied By: mother Transfer Assistance: None Patient Identification Verified: Yes Secondary Verification Process Completed: Yes Patient Requires Transmission-Based Precautions: No Patient Has Alerts: No Electronic Signature(s) Signed: 03/23/2023 5:50:35 PM By: [name] Stall RN, BSN Entered By: [name] on 03/23/2023 05:26:44 -------------------------------------------------------------------------------- Clinic Level of Care Assessment Details Patient Name: Date of Service: [name] MMA [name]. 03/23/2023 8:00 A M Medical Record Number: [id number] Patient Account Number: [id number] [name], [name] ([id number]) [id number] Page 2 of 21 Date of Birth/Sex:  Treating RN: 01-02-1986 (37 y.o. [name] Primary Care Malin Cervini: [name] Other Clinician: Referring Cam Harnden: Treating Zakyria Metzinger/Extender: [name] in Treatment: 0 Clinic Level of Care Assessment Items TOOL 1 Quantity Score X- 1 0 Use when EandM and Procedure is performed on INITIAL visit ASSESSMENTS - Nursing Assessment / Reassessment X- 1 20 General Physical Exam (combine w/ comprehensive assessment (listed just below) when performed on new pt. evals) X- 1 25 Comprehensive Assessment (HX, ROS, Risk Assessments, Wounds Hx, etc.) ASSESSMENTS - Wound and Skin Assessment / Reassessment X- 1 10 Dermatologic / Skin Assessment (not related to wound area) ASSESSMENTS - Ostomy and/or Continence Assessment and Care []  - 0 Incontinence Assessment and Management []  - 0 Ostomy Care Assessment and Management (repouching, etc.) PROCESS - Coordination of Care []  - 0 Simple Patient / Family Education for ongoing care X- 1 20 Complex (extensive) Patient / Family Education for ongoing care X- 1 10 Staff obtains Chiropractor, Records, T Results / Process Orders est []  - 0 Staff telephones HHA, Nursing Homes / Clarify orders / etc []  - 0 Routine Transfer to another Facility (non-emergent condition) []  - 0 Routine Hospital Admission (non-emergent condition) X- 1 15 New Admissions / Manufacturing engineer / Ordering NPWT Apligraf, etc. , []  - 0 Emergency Hospital Admission (emergent condition) PROCESS - Special Needs []  - 0 Pediatric / Minor Patient Management []  - 0 Isolation Patient Management []  - 0 Hearing / Language / Visual special needs []  - 0 Assessment of Community assistance (transportation, D/C planning, etc.) []  - 0 Additional assistance / Altered mentation []  - 0 Support Surface(s) Assessment (bed, cushion, seat, etc.) INTERVENTIONS - Miscellaneous []  - 0 External ear exam []  - 0 Patient Transfer (multiple staff / Nurse, adult /  Similar devices) []  - 0 Simple Staple / Suture removal (25 or less) []  - 0 Complex Staple / Suture removal (26 or more) []  - 0 Hypo/Hyperglycemic Management (do not check if billed separately) X- 1 15 Ankle / Brachial Index (ABI) - do not check if billed separately Has the patient  Date of Birth/Sex: Treating RN: May 26, 1986 (37 y.o. [name] Primary Care Trever Streater: [name] Other Clinician: Referring Geraldean Walen: Treating Jahson [name]/Extender: [name] in Treatment: [address] Lenord Carbo ([id number]) [id number] Page 5 of 21 Encounter Discharge Information Items Discharge Condition: Stable Ambulatory Status: Cane Discharge Destination: Home Transportation: Private Auto Accompanied By: mother Schedule Follow-up Appointment: Yes Clinical Summary of Care: Electronic Signature(s) Signed: 03/23/2023 5:50:35 PM By: [name] Stall RN, BSN Entered By: [name] on 03/23/2023  06:33:14 -------------------------------------------------------------------------------- Lower Extremity Assessment Details Patient Name: Date of Service: [name] MMA [name]. 03/23/2023 8:00 A M Medical Record Number: [id number] Patient Account Number: [id number] Date of Birth/Sex: Treating RN: Jun 08, 1986 (37 y.o. [name], [name] Primary Care Hortence Charter: [name] Other Clinician: Referring Samel Bruna: Treating Ericson Nafziger/Extender: [name] in Treatment: 0 Edema Assessment Assessed: [name]: Yes] [name]: Yes] Edema: [Left: Yes] [Right: Yes] Calf Left: Right: Point of Measurement: 39 cm From Medial Instep 48 cm 47.5 cm Ankle Left: Right: Point of Measurement: 9 cm From Medial Instep 29 cm 28 cm Knee To Floor Left: Right: From Medial Instep 45 cm 45 cm Vascular Assessment Pulses: Dorsalis Pedis Palpable: [Left:Yes] [Right:Yes] Doppler Audible: [Left:Yes] [Right:Yes] Posterior Tibial Palpable: [Left:Yes] [Right:Yes] Doppler Audible: [Left:Yes] [Right:Yes] Extremity colors, hair growth, and conditions: Extremity Color: [Left:Hyperpigmented] [Right:Hyperpigmented] Hair Growth on Extremity: [Left:No] [Right:No] Temperature of Extremity: [Left:Warm] [Right:Warm] Capillary Refill: [Left:< 3 seconds] [Right:< 3 seconds] Dependent Rubor: [Left:No] [Right:No] Blanched when Elevated: [Left:No] [Right:No] Lipodermatosclerosis: [Left:No] [Right:No] Blood Pressure: Brachial: [Left:136] [Right:136] Ankle: [Left:Dorsalis Pedis: 170 1.25] [Right:Dorsalis Pedis: 140 1.03] Toe Nail Assessment Left: Right: Thick: No No Discolored: No No Bhalla, Jaxon [name] ([id number]) [id number] Page 6 of 21 Deformed: No No Improper Length and Hygiene: No No Electronic Signature(s) Signed: 03/23/2023 5:50:35 PM By: [name] Stall RN, BSN Entered By: [name] on 03/23/2023  05:50:48 -------------------------------------------------------------------------------- Multi Wound Chart Details Patient Name: Date of Service: [name] MMA [name]. 03/23/2023 8:00 A M Medical Record Number: [id number] Patient Account Number: [id number] Date of Birth/Sex: Treating RN: May 28, 1986 (37 y.o. F) Primary Care Chardonnay Holzmann: [name] Other Clinician: Referring Eppie Barhorst: Treating Octavia Mottola/Extender: [name] in Treatment: 0 Vital Signs Height(in): 69 Pulse(bpm): 76 Weight(lbs): 272 Blood Pressure(mmHg): 136/94 Body Mass Index(BMI): 40.2 Temperature(F): 98.9 Respiratory Rate(breaths/min): 20 [1:Photos:] Left, Medial Foot Left, Distal Foot Left, Proximal, Dorsal Foot Wound Location: Gradually Appeared Gradually Appeared Gradually Appeared Wounding Event: T be determined o T be determined o T be determined o Primary Etiology: Anemia, Sleep Apnea, Vasculitis, Anemia, Sleep Apnea, Vasculitis, Anemia, Sleep Apnea, Vasculitis, Comorbid History: Neuropathy Neuropathy Neuropathy 03/09/2023 03/09/2023 03/09/2023 Date Acquired: 0 0 0 Weeks of Treatment: Open Open Open Wound Status: No No No Wound Recurrence: Yes Yes Yes Clustered Wound: 9 5 2  Clustered Quantity: 13x7x0.1 5x6x0.1 5.2x4x0.2 Measurements L x W x D (cm) 71.471 23.562 16.336 A (cm) : rea 7.147 2.356 3.267 Volume (cm) : Unclassifiable Unclassifiable Unclassifiable Classification: Medium Medium Medium Exudate A mount: Serosanguineous Serosanguineous Serosanguineous Exudate Type: red, brown red, brown red, brown Exudate Color: Distinct, outline attached Distinct, outline attached Distinct, outline attached Wound Margin: None Present (0%) None Present (0%) None Present (0%) Granulation A mount: N/A N/A N/A Granulation Quality: Large (67-100%) Large (67-100%) Large (67-100%) Necrotic A mount: Eschar, Adherent Slough Eschar, Adherent Slough Eschar, Adherent Slough Necrotic  Tissue: Fascia: No Fascia: No Fascia: No Exposed Structures: Fat Layer (Subcutaneous Tissue): No Fat Layer (Subcutaneous Tissue): No Fat Layer (Subcutaneous Tissue): No Tendon: No  Date of Birth/Sex: Treating RN: May 26, 1986 (37 y.o. [name] Primary Care Trever Streater: [name] Other Clinician: Referring Geraldean Walen: Treating Jahson [name]/Extender: [name] in Treatment: [address] Lenord Carbo ([id number]) [id number] Page 5 of 21 Encounter Discharge Information Items Discharge Condition: Stable Ambulatory Status: Cane Discharge Destination: Home Transportation: Private Auto Accompanied By: mother Schedule Follow-up Appointment: Yes Clinical Summary of Care: Electronic Signature(s) Signed: 03/23/2023 5:50:35 PM By: [name] Stall RN, BSN Entered By: [name] on 03/23/2023  06:33:14 -------------------------------------------------------------------------------- Lower Extremity Assessment Details Patient Name: Date of Service: [name] MMA [name]. 03/23/2023 8:00 A M Medical Record Number: [id number] Patient Account Number: [id number] Date of Birth/Sex: Treating RN: Jun 08, 1986 (37 y.o. [name], [name] Primary Care Hortence Charter: [name] Other Clinician: Referring Samel Bruna: Treating Ericson Nafziger/Extender: [name] in Treatment: 0 Edema Assessment Assessed: [name]: Yes] [name]: Yes] Edema: [Left: Yes] [Right: Yes] Calf Left: Right: Point of Measurement: 39 cm From Medial Instep 48 cm 47.5 cm Ankle Left: Right: Point of Measurement: 9 cm From Medial Instep 29 cm 28 cm Knee To Floor Left: Right: From Medial Instep 45 cm 45 cm Vascular Assessment Pulses: Dorsalis Pedis Palpable: [Left:Yes] [Right:Yes] Doppler Audible: [Left:Yes] [Right:Yes] Posterior Tibial Palpable: [Left:Yes] [Right:Yes] Doppler Audible: [Left:Yes] [Right:Yes] Extremity colors, hair growth, and conditions: Extremity Color: [Left:Hyperpigmented] [Right:Hyperpigmented] Hair Growth on Extremity: [Left:No] [Right:No] Temperature of Extremity: [Left:Warm] [Right:Warm] Capillary Refill: [Left:< 3 seconds] [Right:< 3 seconds] Dependent Rubor: [Left:No] [Right:No] Blanched when Elevated: [Left:No] [Right:No] Lipodermatosclerosis: [Left:No] [Right:No] Blood Pressure: Brachial: [Left:136] [Right:136] Ankle: [Left:Dorsalis Pedis: 170 1.25] [Right:Dorsalis Pedis: 140 1.03] Toe Nail Assessment Left: Right: Thick: No No Discolored: No No Bhalla, Jaxon [name] ([id number]) [id number] Page 6 of 21 Deformed: No No Improper Length and Hygiene: No No Electronic Signature(s) Signed: 03/23/2023 5:50:35 PM By: [name] Stall RN, BSN Entered By: [name] on 03/23/2023  05:50:48 -------------------------------------------------------------------------------- Multi Wound Chart Details Patient Name: Date of Service: [name] MMA [name]. 03/23/2023 8:00 A M Medical Record Number: [id number] Patient Account Number: [id number] Date of Birth/Sex: Treating RN: May 28, 1986 (37 y.o. F) Primary Care Chardonnay Holzmann: [name] Other Clinician: Referring Eppie Barhorst: Treating Octavia Mottola/Extender: [name] in Treatment: 0 Vital Signs Height(in): 69 Pulse(bpm): 76 Weight(lbs): 272 Blood Pressure(mmHg): 136/94 Body Mass Index(BMI): 40.2 Temperature(F): 98.9 Respiratory Rate(breaths/min): 20 [1:Photos:] Left, Medial Foot Left, Distal Foot Left, Proximal, Dorsal Foot Wound Location: Gradually Appeared Gradually Appeared Gradually Appeared Wounding Event: T be determined o T be determined o T be determined o Primary Etiology: Anemia, Sleep Apnea, Vasculitis, Anemia, Sleep Apnea, Vasculitis, Anemia, Sleep Apnea, Vasculitis, Comorbid History: Neuropathy Neuropathy Neuropathy 03/09/2023 03/09/2023 03/09/2023 Date Acquired: 0 0 0 Weeks of Treatment: Open Open Open Wound Status: No No No Wound Recurrence: Yes Yes Yes Clustered Wound: 9 5 2  Clustered Quantity: 13x7x0.1 5x6x0.1 5.2x4x0.2 Measurements L x W x D (cm) 71.471 23.562 16.336 A (cm) : rea 7.147 2.356 3.267 Volume (cm) : Unclassifiable Unclassifiable Unclassifiable Classification: Medium Medium Medium Exudate A mount: Serosanguineous Serosanguineous Serosanguineous Exudate Type: red, brown red, brown red, brown Exudate Color: Distinct, outline attached Distinct, outline attached Distinct, outline attached Wound Margin: None Present (0%) None Present (0%) None Present (0%) Granulation A mount: N/A N/A N/A Granulation Quality: Large (67-100%) Large (67-100%) Large (67-100%) Necrotic A mount: Eschar, Adherent Slough Eschar, Adherent Slough Eschar, Adherent Slough Necrotic  Tissue: Fascia: No Fascia: No Fascia: No Exposed Structures: Fat Layer (Subcutaneous Tissue): No Fat Layer (Subcutaneous Tissue): No Fat Layer (Subcutaneous Tissue): No Tendon: No  Date of Birth/Sex: Treating RN: May 26, 1986 (37 y.o. [name] Primary Care Trever Streater: [name] Other Clinician: Referring Geraldean Walen: Treating Jahson [name]/Extender: [name] in Treatment: [address] Lenord Carbo ([id number]) [id number] Page 5 of 21 Encounter Discharge Information Items Discharge Condition: Stable Ambulatory Status: Cane Discharge Destination: Home Transportation: Private Auto Accompanied By: mother Schedule Follow-up Appointment: Yes Clinical Summary of Care: Electronic Signature(s) Signed: 03/23/2023 5:50:35 PM By: [name] Stall RN, BSN Entered By: [name] on 03/23/2023  06:33:14 -------------------------------------------------------------------------------- Lower Extremity Assessment Details Patient Name: Date of Service: [name] MMA [name]. 03/23/2023 8:00 A M Medical Record Number: [id number] Patient Account Number: [id number] Date of Birth/Sex: Treating RN: Jun 08, 1986 (37 y.o. [name], [name] Primary Care Hortence Charter: [name] Other Clinician: Referring Samel Bruna: Treating Ericson Nafziger/Extender: [name] in Treatment: 0 Edema Assessment Assessed: [name]: Yes] [name]: Yes] Edema: [Left: Yes] [Right: Yes] Calf Left: Right: Point of Measurement: 39 cm From Medial Instep 48 cm 47.5 cm Ankle Left: Right: Point of Measurement: 9 cm From Medial Instep 29 cm 28 cm Knee To Floor Left: Right: From Medial Instep 45 cm 45 cm Vascular Assessment Pulses: Dorsalis Pedis Palpable: [Left:Yes] [Right:Yes] Doppler Audible: [Left:Yes] [Right:Yes] Posterior Tibial Palpable: [Left:Yes] [Right:Yes] Doppler Audible: [Left:Yes] [Right:Yes] Extremity colors, hair growth, and conditions: Extremity Color: [Left:Hyperpigmented] [Right:Hyperpigmented] Hair Growth on Extremity: [Left:No] [Right:No] Temperature of Extremity: [Left:Warm] [Right:Warm] Capillary Refill: [Left:< 3 seconds] [Right:< 3 seconds] Dependent Rubor: [Left:No] [Right:No] Blanched when Elevated: [Left:No] [Right:No] Lipodermatosclerosis: [Left:No] [Right:No] Blood Pressure: Brachial: [Left:136] [Right:136] Ankle: [Left:Dorsalis Pedis: 170 1.25] [Right:Dorsalis Pedis: 140 1.03] Toe Nail Assessment Left: Right: Thick: No No Discolored: No No Bhalla, Jaxon [name] ([id number]) [id number] Page 6 of 21 Deformed: No No Improper Length and Hygiene: No No Electronic Signature(s) Signed: 03/23/2023 5:50:35 PM By: [name] Stall RN, BSN Entered By: [name] on 03/23/2023  05:50:48 -------------------------------------------------------------------------------- Multi Wound Chart Details Patient Name: Date of Service: [name] MMA [name]. 03/23/2023 8:00 A M Medical Record Number: [id number] Patient Account Number: [id number] Date of Birth/Sex: Treating RN: May 28, 1986 (37 y.o. F) Primary Care Chardonnay Holzmann: [name] Other Clinician: Referring Eppie Barhorst: Treating Octavia Mottola/Extender: [name] in Treatment: 0 Vital Signs Height(in): 69 Pulse(bpm): 76 Weight(lbs): 272 Blood Pressure(mmHg): 136/94 Body Mass Index(BMI): 40.2 Temperature(F): 98.9 Respiratory Rate(breaths/min): 20 [1:Photos:] Left, Medial Foot Left, Distal Foot Left, Proximal, Dorsal Foot Wound Location: Gradually Appeared Gradually Appeared Gradually Appeared Wounding Event: T be determined o T be determined o T be determined o Primary Etiology: Anemia, Sleep Apnea, Vasculitis, Anemia, Sleep Apnea, Vasculitis, Anemia, Sleep Apnea, Vasculitis, Comorbid History: Neuropathy Neuropathy Neuropathy 03/09/2023 03/09/2023 03/09/2023 Date Acquired: 0 0 0 Weeks of Treatment: Open Open Open Wound Status: No No No Wound Recurrence: Yes Yes Yes Clustered Wound: 9 5 2  Clustered Quantity: 13x7x0.1 5x6x0.1 5.2x4x0.2 Measurements L x W x D (cm) 71.471 23.562 16.336 A (cm) : rea 7.147 2.356 3.267 Volume (cm) : Unclassifiable Unclassifiable Unclassifiable Classification: Medium Medium Medium Exudate A mount: Serosanguineous Serosanguineous Serosanguineous Exudate Type: red, brown red, brown red, brown Exudate Color: Distinct, outline attached Distinct, outline attached Distinct, outline attached Wound Margin: None Present (0%) None Present (0%) None Present (0%) Granulation A mount: N/A N/A N/A Granulation Quality: Large (67-100%) Large (67-100%) Large (67-100%) Necrotic A mount: Eschar, Adherent Slough Eschar, Adherent Slough Eschar, Adherent Slough Necrotic  Tissue: Fascia: No Fascia: No Fascia: No Exposed Structures: Fat Layer (Subcutaneous Tissue): No Fat Layer (Subcutaneous Tissue): No Fat Layer (Subcutaneous Tissue): No Tendon: No  [name], [name] ([id number]) [id number] Page 1 of 21 Visit Report for 03/23/2023 Allergy List Details Patient Name: Date of Service: [name] MMA [name]. 03/23/2023 8:00 A M Medical Record Number: [id number] Patient Account Number: [id number] Date of Birth/Sex: Treating RN: [birth year] (37 y.o. [name] Primary Care Florena Kozma: [name] Other Clinician: Referring Trenyce Loera: Treating Norman Bier/Extender: [name] in Treatment: 0 Allergies Active Allergies Lyrica Allergy Notes Electronic Signature(s) Signed: 03/23/2023 5:50:35 PM By: [name] Stall RN, BSN Entered By: [name] on 03/22/2023 13:52:35 -------------------------------------------------------------------------------- Arrival Information Details Patient Name: Date of Service: [name] MMA [name]. 03/23/2023 8:00 A M Medical Record Number: [id number] Patient Account Number: [id number] Date of Birth/Sex: Treating RN: 10/27/1985 (37 y.o. [name] Primary Care Jamea Robicheaux: [name] Other Clinician: Referring Tracer Gutridge: Treating Danett Palazzo/Extender: [name] in Treatment: 0 Visit Information Patient Arrived: Danella Maiers Time: 08:22 Accompanied By: mother Transfer Assistance: None Patient Identification Verified: Yes Secondary Verification Process Completed: Yes Patient Requires Transmission-Based Precautions: No Patient Has Alerts: No Electronic Signature(s) Signed: 03/23/2023 5:50:35 PM By: [name] Stall RN, BSN Entered By: [name] on 03/23/2023 05:26:44 -------------------------------------------------------------------------------- Clinic Level of Care Assessment Details Patient Name: Date of Service: [name] MMA [name]. 03/23/2023 8:00 A M Medical Record Number: [id number] Patient Account Number: [id number] [name], [name] ([id number]) [id number] Page 2 of 21 Date of Birth/Sex:  Treating RN: 01-02-1986 (37 y.o. [name] Primary Care Malin Cervini: [name] Other Clinician: Referring Cam Harnden: Treating Zakyria Metzinger/Extender: [name] in Treatment: 0 Clinic Level of Care Assessment Items TOOL 1 Quantity Score X- 1 0 Use when EandM and Procedure is performed on INITIAL visit ASSESSMENTS - Nursing Assessment / Reassessment X- 1 20 General Physical Exam (combine w/ comprehensive assessment (listed just below) when performed on new pt. evals) X- 1 25 Comprehensive Assessment (HX, ROS, Risk Assessments, Wounds Hx, etc.) ASSESSMENTS - Wound and Skin Assessment / Reassessment X- 1 10 Dermatologic / Skin Assessment (not related to wound area) ASSESSMENTS - Ostomy and/or Continence Assessment and Care []  - 0 Incontinence Assessment and Management []  - 0 Ostomy Care Assessment and Management (repouching, etc.) PROCESS - Coordination of Care []  - 0 Simple Patient / Family Education for ongoing care X- 1 20 Complex (extensive) Patient / Family Education for ongoing care X- 1 10 Staff obtains Chiropractor, Records, T Results / Process Orders est []  - 0 Staff telephones HHA, Nursing Homes / Clarify orders / etc []  - 0 Routine Transfer to another Facility (non-emergent condition) []  - 0 Routine Hospital Admission (non-emergent condition) X- 1 15 New Admissions / Manufacturing engineer / Ordering NPWT Apligraf, etc. , []  - 0 Emergency Hospital Admission (emergent condition) PROCESS - Special Needs []  - 0 Pediatric / Minor Patient Management []  - 0 Isolation Patient Management []  - 0 Hearing / Language / Visual special needs []  - 0 Assessment of Community assistance (transportation, D/C planning, etc.) []  - 0 Additional assistance / Altered mentation []  - 0 Support Surface(s) Assessment (bed, cushion, seat, etc.) INTERVENTIONS - Miscellaneous []  - 0 External ear exam []  - 0 Patient Transfer (multiple staff / Nurse, adult /  Similar devices) []  - 0 Simple Staple / Suture removal (25 or less) []  - 0 Complex Staple / Suture removal (26 or more) []  - 0 Hypo/Hyperglycemic Management (do not check if billed separately) X- 1 15 Ankle / Brachial Index (ABI) - do not check if billed separately Has the patient  [name], [name] ([id number]) [id number] Page 1 of 21 Visit Report for 03/23/2023 Allergy List Details Patient Name: Date of Service: [name] MMA [name]. 03/23/2023 8:00 A M Medical Record Number: [id number] Patient Account Number: [id number] Date of Birth/Sex: Treating RN: [birth year] (37 y.o. [name] Primary Care Florena Kozma: [name] Other Clinician: Referring Trenyce Loera: Treating Norman Bier/Extender: [name] in Treatment: 0 Allergies Active Allergies Lyrica Allergy Notes Electronic Signature(s) Signed: 03/23/2023 5:50:35 PM By: [name] Stall RN, BSN Entered By: [name] on 03/22/2023 13:52:35 -------------------------------------------------------------------------------- Arrival Information Details Patient Name: Date of Service: [name] MMA [name]. 03/23/2023 8:00 A M Medical Record Number: [id number] Patient Account Number: [id number] Date of Birth/Sex: Treating RN: 10/27/1985 (37 y.o. [name] Primary Care Jamea Robicheaux: [name] Other Clinician: Referring Tracer Gutridge: Treating Danett Palazzo/Extender: [name] in Treatment: 0 Visit Information Patient Arrived: Danella Maiers Time: 08:22 Accompanied By: mother Transfer Assistance: None Patient Identification Verified: Yes Secondary Verification Process Completed: Yes Patient Requires Transmission-Based Precautions: No Patient Has Alerts: No Electronic Signature(s) Signed: 03/23/2023 5:50:35 PM By: [name] Stall RN, BSN Entered By: [name] on 03/23/2023 05:26:44 -------------------------------------------------------------------------------- Clinic Level of Care Assessment Details Patient Name: Date of Service: [name] MMA [name]. 03/23/2023 8:00 A M Medical Record Number: [id number] Patient Account Number: [id number] [name], [name] ([id number]) [id number] Page 2 of 21 Date of Birth/Sex:  Treating RN: 01-02-1986 (37 y.o. [name] Primary Care Malin Cervini: [name] Other Clinician: Referring Cam Harnden: Treating Zakyria Metzinger/Extender: [name] in Treatment: 0 Clinic Level of Care Assessment Items TOOL 1 Quantity Score X- 1 0 Use when EandM and Procedure is performed on INITIAL visit ASSESSMENTS - Nursing Assessment / Reassessment X- 1 20 General Physical Exam (combine w/ comprehensive assessment (listed just below) when performed on new pt. evals) X- 1 25 Comprehensive Assessment (HX, ROS, Risk Assessments, Wounds Hx, etc.) ASSESSMENTS - Wound and Skin Assessment / Reassessment X- 1 10 Dermatologic / Skin Assessment (not related to wound area) ASSESSMENTS - Ostomy and/or Continence Assessment and Care []  - 0 Incontinence Assessment and Management []  - 0 Ostomy Care Assessment and Management (repouching, etc.) PROCESS - Coordination of Care []  - 0 Simple Patient / Family Education for ongoing care X- 1 20 Complex (extensive) Patient / Family Education for ongoing care X- 1 10 Staff obtains Chiropractor, Records, T Results / Process Orders est []  - 0 Staff telephones HHA, Nursing Homes / Clarify orders / etc []  - 0 Routine Transfer to another Facility (non-emergent condition) []  - 0 Routine Hospital Admission (non-emergent condition) X- 1 15 New Admissions / Manufacturing engineer / Ordering NPWT Apligraf, etc. , []  - 0 Emergency Hospital Admission (emergent condition) PROCESS - Special Needs []  - 0 Pediatric / Minor Patient Management []  - 0 Isolation Patient Management []  - 0 Hearing / Language / Visual special needs []  - 0 Assessment of Community assistance (transportation, D/C planning, etc.) []  - 0 Additional assistance / Altered mentation []  - 0 Support Surface(s) Assessment (bed, cushion, seat, etc.) INTERVENTIONS - Miscellaneous []  - 0 External ear exam []  - 0 Patient Transfer (multiple staff / Nurse, adult /  Similar devices) []  - 0 Simple Staple / Suture removal (25 or less) []  - 0 Complex Staple / Suture removal (26 or more) []  - 0 Hypo/Hyperglycemic Management (do not check if billed separately) X- 1 15 Ankle / Brachial Index (ABI) - do not check if billed separately Has the patient  [name], [name] ([id number]) [id number] Page 1 of 21 Visit Report for 03/23/2023 Allergy List Details Patient Name: Date of Service: [name] MMA [name]. 03/23/2023 8:00 A M Medical Record Number: [id number] Patient Account Number: [id number] Date of Birth/Sex: Treating RN: [birth year] (37 y.o. [name] Primary Care Florena Kozma: [name] Other Clinician: Referring Trenyce Loera: Treating Norman Bier/Extender: [name] in Treatment: 0 Allergies Active Allergies Lyrica Allergy Notes Electronic Signature(s) Signed: 03/23/2023 5:50:35 PM By: [name] Stall RN, BSN Entered By: [name] on 03/22/2023 13:52:35 -------------------------------------------------------------------------------- Arrival Information Details Patient Name: Date of Service: [name] MMA [name]. 03/23/2023 8:00 A M Medical Record Number: [id number] Patient Account Number: [id number] Date of Birth/Sex: Treating RN: 10/27/1985 (37 y.o. [name] Primary Care Jamea Robicheaux: [name] Other Clinician: Referring Tracer Gutridge: Treating Danett Palazzo/Extender: [name] in Treatment: 0 Visit Information Patient Arrived: Danella Maiers Time: 08:22 Accompanied By: mother Transfer Assistance: None Patient Identification Verified: Yes Secondary Verification Process Completed: Yes Patient Requires Transmission-Based Precautions: No Patient Has Alerts: No Electronic Signature(s) Signed: 03/23/2023 5:50:35 PM By: [name] Stall RN, BSN Entered By: [name] on 03/23/2023 05:26:44 -------------------------------------------------------------------------------- Clinic Level of Care Assessment Details Patient Name: Date of Service: [name] MMA [name]. 03/23/2023 8:00 A M Medical Record Number: [id number] Patient Account Number: [id number] [name], [name] ([id number]) [id number] Page 2 of 21 Date of Birth/Sex:  Treating RN: 01-02-1986 (37 y.o. [name] Primary Care Malin Cervini: [name] Other Clinician: Referring Cam Harnden: Treating Zakyria Metzinger/Extender: [name] in Treatment: 0 Clinic Level of Care Assessment Items TOOL 1 Quantity Score X- 1 0 Use when EandM and Procedure is performed on INITIAL visit ASSESSMENTS - Nursing Assessment / Reassessment X- 1 20 General Physical Exam (combine w/ comprehensive assessment (listed just below) when performed on new pt. evals) X- 1 25 Comprehensive Assessment (HX, ROS, Risk Assessments, Wounds Hx, etc.) ASSESSMENTS - Wound and Skin Assessment / Reassessment X- 1 10 Dermatologic / Skin Assessment (not related to wound area) ASSESSMENTS - Ostomy and/or Continence Assessment and Care []  - 0 Incontinence Assessment and Management []  - 0 Ostomy Care Assessment and Management (repouching, etc.) PROCESS - Coordination of Care []  - 0 Simple Patient / Family Education for ongoing care X- 1 20 Complex (extensive) Patient / Family Education for ongoing care X- 1 10 Staff obtains Chiropractor, Records, T Results / Process Orders est []  - 0 Staff telephones HHA, Nursing Homes / Clarify orders / etc []  - 0 Routine Transfer to another Facility (non-emergent condition) []  - 0 Routine Hospital Admission (non-emergent condition) X- 1 15 New Admissions / Manufacturing engineer / Ordering NPWT Apligraf, etc. , []  - 0 Emergency Hospital Admission (emergent condition) PROCESS - Special Needs []  - 0 Pediatric / Minor Patient Management []  - 0 Isolation Patient Management []  - 0 Hearing / Language / Visual special needs []  - 0 Assessment of Community assistance (transportation, D/C planning, etc.) []  - 0 Additional assistance / Altered mentation []  - 0 Support Surface(s) Assessment (bed, cushion, seat, etc.) INTERVENTIONS - Miscellaneous []  - 0 External ear exam []  - 0 Patient Transfer (multiple staff / Nurse, adult /  Similar devices) []  - 0 Simple Staple / Suture removal (25 or less) []  - 0 Complex Staple / Suture removal (26 or more) []  - 0 Hypo/Hyperglycemic Management (do not check if billed separately) X- 1 15 Ankle / Brachial Index (ABI) - do not check if billed separately Has the patient  elevation: No Is the Current Pain Management Adequate: Adequate How does your wound impact your activities of daily livingo Sleep: No Bathing: No Appetite: No Relationship With Others: No Bladder Continence: No Emotions: No Bowel Continence: No Work: No Toileting: No Drive: No Dressing: No Hobbies: No Notes pain with walking. Electronic Signature(s) Signed: 03/23/2023 5:50:35 PM By: [name] Stall RN, BSN Entered By: [name] on 03/23/2023 05:29:48 -------------------------------------------------------------------------------- Patient/Caregiver Education Details Patient Name: Date of Service: [name] MMA [name]. 9/17/2024andnbsp8:00 A M Medical Record Number: [id number] Patient Account Number: [id number] Date of Birth/Gender: Treating RN: 1985-07-13 (37 y.o. [name] Primary Care Physician: [name] Other Clinician: Referring Physician: Treating Physician/Extender: [name] in Treatment: 0 Education Assessment [name] ([id number]) [id number] Page 13 of 21 Education Provided To: Patient and Caregiver mother present Education Topics Provided Welcome [facility]-New Patient Packet: o Handouts: Welcome [facility] o Methods: Explain/Verbal Responses: Reinforcements needed Electronic Signature(s) Signed: 03/23/2023 5:50:35 PM By: [name] Stall RN, BSN Entered By: [name] on 03/23/2023 06:04:17 -------------------------------------------------------------------------------- Wound Assessment Details Patient Name: Date of Service: [name] MMA [name]. 03/23/2023 8:00 A M Medical Record Number: [id number] Patient Account Number: [id number] Date of Birth/Sex: Treating RN: 1986-05-18 (37 y.o. [name], [name] Primary Care Lylla Eifler: [name] Other  Clinician: Referring Charlisa Cham: Treating Timisha Mondry/Extender: [name] in Treatment: 0 Wound Status Wound Number: 1 Primary Etiology: T be determined o Wound Location: Left, Medial Foot Wound Status: Open Wounding Event: Gradually Appeared Comorbid History: Anemia, Sleep Apnea, Vasculitis, Neuropathy Date Acquired: 03/09/2023 Weeks Of Treatment: 0 Clustered Wound: Yes Photos Wound Measurements Length: (cm) Width: (cm) Depth: (cm) Clustered Quantity: Area: (cm) Volume: (cm) 13 % Reduction in Area: 7 % Reduction in Volume: 0.1 Epithelialization: None 9 Tunneling: No 71.471 Undermining: No 7.147 Wound Description Classification: Unclassifiable Wound Margin: Distinct, outline attached Exudate Amount: Medium Exudate Type: Serosanguineous Exudate Color: red, brown Foul Odor After Cleansing: No Slough/Fibrino Yes Wound Bed Granulation Amount: None Present (0%) Exposed Structure Necrotic Amount: Large (67-100%) Fascia Exposed: No Necrotic Quality: Eschar, Adherent Slough Fat Layer (Subcutaneous Tissue) Exposed: No [name], [name] ([id number]) [id number] Page 14 of 21 Tendon Exposed: No Muscle Exposed: No Joint Exposed: No Bone Exposed: No Periwound Skin Texture Texture Color No Abnormalities Noted: No No Abnormalities Noted: No Callus: No Atrophie Blanche: No Crepitus: No Cyanosis: No Excoriation: No Ecchymosis: No Induration: No Erythema: No Rash: No Hemosiderin Staining: No Scarring: No Mottled: No Pallor: No Moisture Rubor: No No Abnormalities Noted: No Dry / Scaly: Yes Maceration: No Treatment Notes Wound #1 (Foot) Wound Laterality: Left, Medial Cleanser Soap and Water Discharge Instruction: May shower and wash wound with dial antibacterial soap and water prior to dressing change. Vashe 5.8 (oz) Discharge Instruction: Cleanse the wound with Vashe prior to applying a clean dressing using gauze  sponges, not tissue or cotton balls. Peri-Wound Care Triamcinolone 15 (g) Discharge Instruction: Use triamcinolone 15 (g) applied liberally. Sween Lotion (Moisturizing lotion) Discharge Instruction: Apply moisturizing lotion as directed Topical Triamcinolone Discharge Instruction: Apply Triamcinolone as directed Primary Dressing Xeroform Occlusive Gauze Dressing, 4x4 in Discharge Instruction: Apply to wound bed as instructed Secondary Dressing ABD Pad, 8x10 Discharge Instruction: Apply over primary dressing as directed. Woven Gauze Sponge, Non-Sterile 4x4 in Discharge Instruction: Apply over primary dressing as directed. Secured With Compression Wrap Urgo K2 Lite, (equivalent to a 3 layer) two layer compression system, regular Discharge Instruction: Apply Urgo K2  Yes Maceration: No Treatment Notes Wound #4 (Foot) Wound Laterality: Dorsal, Right Cleanser Soap and Water Discharge Instruction: May shower and wash wound with dial antibacterial soap and water prior to dressing change. Vashe 5.8 (oz) Discharge Instruction: Cleanse the wound with Vashe prior to applying a clean dressing using gauze sponges, not tissue or cotton balls. Peri-Wound Care Triamcinolone 15 (g) Discharge Instruction: Use triamcinolone 15 (g) applied liberally. Sween Lotion (Moisturizing lotion) Discharge Instruction: Apply moisturizing lotion as directed Topical Triamcinolone Discharge Instruction: Apply Triamcinolone as directed Primary Dressing Xeroform Occlusive Gauze Dressing, 4x4 in Discharge Instruction: Apply to wound bed as instructed Secondary Dressing ABD Pad, 8x10 Discharge Instruction: Apply over primary dressing as directed. Woven Gauze Sponge, Non-Sterile 4x4 in Discharge Instruction: Apply over primary dressing as directed. Secured With Compression Wrap Urgo K2 Lite, (equivalent to a 3 layer) two layer compression system, regular Discharge Instruction: Apply Urgo K2 Lite as directed (alternative to 3 layer compression). Compression Stockings Add-Ons Electronic Signature(s) Signed: 03/23/2023 5:50:35 PM By: [name] Stall RN, BSN Entered By: [name] on 03/23/2023 05:43:54 [name] ([id number]) [id number] Page 20 of 21 -------------------------------------------------------------------------------- Wound Assessment Details Patient Name: Date of Service: [name] [name]. 03/23/2023 8:00 A M Medical Record Number: [id number] Patient Account Number: [id number] Date of Birth/Sex: Treating RN: Nov 30, 1985 (37 y.o. [name],  [name] Primary Care [name]: [name] Other Clinician: Referring [name]: Treating [name]/Extender: [name] in Treatment: 0 Wound Status Wound Number: 5 Primary Etiology: T be determined o Wound Location: Right, Lateral Calcaneus Wound Status: Open Wounding Event: Gradually Appeared Comorbid History: Anemia, Sleep Apnea, Vasculitis, Neuropathy Date Acquired: 03/09/2023 Weeks Of Treatment: 0 Clustered Wound: No Photos Wound Measurements Length: (cm) 3 Width: (cm) 2.5 Depth: (cm) 0.1 Area: (cm) 5.89 Volume: (cm) 0.589 % Reduction in Area: % Reduction in Volume: Epithelialization: Small (1-33%) Tunneling: No Undermining: No Wound Description Classification: Full Thickness Without Exposed Support Wound Margin: Distinct, outline attached Exudate Amount: Medium Exudate Type: Serosanguineous Exudate Color: red, brown Structures Foul Odor After Cleansing: No Slough/Fibrino No Wound Bed Granulation Amount: Medium (34-66%) Exposed Structure Granulation Quality: Red, Pink Fascia Exposed: No Necrotic Amount: Medium (34-66%) Fat Layer (Subcutaneous Tissue) Exposed: No Necrotic Quality: Adherent Slough Tendon Exposed: No Muscle Exposed: No Joint Exposed: No Bone Exposed: No Periwound Skin Texture Texture Color No Abnormalities Noted: No No Abnormalities Noted: No Callus: No Atrophie Blanche: No Crepitus: No Cyanosis: No Excoriation: No Ecchymosis: No Induration: No Erythema: No Rash: No Hemosiderin Staining: No Scarring: No Mottled: No Pallor: No Moisture Rubor: No No Abnormalities Noted: No Dry / Scaly: Yes Maceration: No Treatment Notes [name] [name] ([id number]) [id number] Page 21 of 21 Wound #5 (Calcaneus) Wound Laterality: Right, Lateral Cleanser Soap and Water Discharge Instruction: May shower and wash wound with dial antibacterial soap and water prior to dressing change. Vashe 5.8  (oz) Discharge Instruction: Cleanse the wound with Vashe prior to applying a clean dressing using gauze sponges, not tissue or cotton balls. Peri-Wound Care Triamcinolone 15 (g) Discharge Instruction: Use triamcinolone 15 (g) applied liberally. Sween Lotion (Moisturizing lotion) Discharge Instruction: Apply moisturizing lotion as directed Topical Triamcinolone Discharge Instruction: Apply Triamcinolone as directed Primary Dressing Xeroform Occlusive Gauze Dressing, 4x4 in Discharge Instruction: Apply to wound bed as instructed Secondary Dressing ABD Pad, 8x10 Discharge Instruction: Apply over primary dressing as directed. Woven Gauze Sponge, Non-Sterile 4x4 in Discharge Instruction: Apply over primary dressing as directed. Secured With Compression Wrap Urgo K2 Lite, (equivalent to a 3 layer)

## 2023-03-24 NOTE — Progress Notes (Signed)
ARIALYNN, SKALSKI R ([id number]) [id number] Page 1 of 4 Visit Report for 03/23/2023 Abuse Risk Screen Details Patient Name: Date of Service: [name] NDRA R. 03/23/2023 8:00 A M Medical Record Number: [id number] Patient Account Number: [id number] Date of Birth/Sex: Treating RN: 1986/02/02 (37 y.o. [name], [name] Primary Care Liam Bossman: Sanda Linger Other Clinician: Referring Keyoni Lapinski: Treating Khaza Blansett/Extender: Madelyn Flavors in Treatment: 0 Abuse Risk Screen Items Answer ABUSE RISK SCREEN: Has anyone close to you tried to hurt or harm you recentlyo No Do you feel uncomfortable with anyone in your familyo No Has anyone forced you do things that you didnt want to doo No Electronic Signature(s) Signed: 03/23/2023 5:50:35 PM By: Shawn Stall RN, BSN Entered By: Shawn Stall on 03/23/2023 05:19:38 -------------------------------------------------------------------------------- Activities of Daily Living Details Patient Name: Date of Service: [name] MMA GE, [name] 03/23/2023 8:00 A M Medical Record Number: [id number] Patient Account Number: [id number] Date of Birth/Sex: Treating RN: 20-Jun-1986 (37 y.o. [name], [name] Primary Care Jheri Mitter: Sanda Linger Other Clinician: Referring Wiletta Bermingham: Treating Jiselle Sheu/Extender: Madelyn Flavors in Treatment: 0 Activities of Daily Living Items Answer Activities of Daily Living (Please select one for each item) Drive Automobile Need Assistance T Medications ake Completely Able Use T elephone Completely Able Care for Appearance Completely Able Use T oilet Completely Able Bath / Shower Completely Able Dress Self Completely Able Feed Self Completely Able Walk Need Assistance Get In / Out Bed Completely Able Housework Completely Able Prepare Meals Completely Able Handle Money Completely Able Shop for Self Completely Able Electronic Signature(s) Signed:  03/23/2023 5:50:35 PM By: Shawn Stall RN, BSN Entered By: Shawn Stall on 03/23/2023 05:22:18 [name] ([id number]) [id number] Page 2 of 4 -------------------------------------------------------------------------------- Education Screening Details Patient Name: Date of Service: [name] NDRA R. 03/23/2023 8:00 A M Medical Record Number: [id number] Patient Account Number: [id number] Date of Birth/Sex: Treating RN: Dec 08, 1985 (37 y.o. [name], [name] Primary Care [place]: Sanda Linger Other Clinician: Referring Lamaya Hyneman: Treating Haru Shaff/Extender: Madelyn Flavors in Treatment: 0 Primary Learner Assessed: Patient Learning Preferences/Education Level/Primary Language Learning Preference: Explanation, Demonstration, Printed Material Highest Education Level: College or Above Preferred Language: English Cognitive Barrier Language Barrier: No Translator Needed: No Memory Deficit: No Emotional Barrier: No Physical Barrier Impaired Vision: Yes Glasses Impaired Hearing: No Decreased Hand dexterity: No Knowledge/Comprehension Knowledge Level: High Comprehension Level: High Ability to understand written instructions: High Ability to understand verbal instructions: High Motivation Anxiety Level: Calm Cooperation: Cooperative Education Importance: Acknowledges Need Interest in Health Problems: Asks Questions Perception: Coherent Willingness to Engage in Self-Management High Activities: Readiness to Engage in Self-Management High Activities: Electronic Signature(s) Signed: 03/23/2023 5:50:35 PM By: Shawn Stall RN, BSN Entered By: Shawn Stall on 03/23/2023 05:28:57 -------------------------------------------------------------------------------- Fall Risk Assessment Details Patient Name: Date of Service: [name] MMA GE, [name] 03/23/2023 8:00 A M Medical Record Number: [id number] Patient Account Number:  [id number] Date of Birth/Sex: Treating RN: 1986-04-25 (37 y.o. [name] Primary Care Gianella Chismar: Sanda Linger Other Clinician: Referring Samiel Peel: Treating Lattie Riege/Extender: Madelyn Flavors in Treatment: 0 Fall Risk Assessment Items Have you had 2 or more falls in the last 12 monthso 0 No Have you had any fall that resulted in injury in the last 12 monthso 0 No Lorence, Taura R ([id number]) [phone] [id number] Page 3 of 4 FALLS RISK SCREEN History of falling - immediate or within 3 months 0 No Secondary diagnosis (Do you have 2 or  more medical diagnoseso) 0 No Ambulatory aid None/bed rest/wheelchair/nurse 0 No Crutches/cane/walker 15 Yes Furniture 0 No Intravenous therapy Access/Saline/Heparin Lock 0 No Gait/Transferring Normal/ bed rest/ wheelchair 0 No Weak (short steps with or without shuffle, stooped but able to lift head while walking, may seek 10 Yes support from furniture) Impaired (short steps with shuffle, may have difficulty arising from chair, head down, impaired 0 No balance) Mental Status Oriented to own ability 0 Yes Electronic Signature(s) Signed: 03/23/2023 5:50:35 PM By: Shawn Stall RN, BSN Entered By: Shawn Stall on 03/23/2023 05:29:17 -------------------------------------------------------------------------------- Foot Assessment Details Patient Name: Date of Service: [name] MMA GE, [name] 03/23/2023 8:00 A M Medical Record Number: [id number] Patient Account Number: [id number] Date of Birth/Sex: Treating RN: 09/18/1985 (37 y.o. [name] Primary Care Lenyx Boody: Sanda Linger Other Clinician: Referring Leman Martinek: Treating Telvin Reinders/Extender: Madelyn Flavors in Treatment: 0 Foot Assessment Items Site Locations + = Sensation present, - = Sensation absent, C = Callus, U = Ulcer R = Redness, W = Warmth, M = Maceration, PU = Pre-ulcerative lesion F = Fissure, S = Swelling, D =  Dryness Assessment Right: Left: Other Deformity: No No Prior Foot Ulcer: No No Prior Amputation: No No Charcot Joint: No No Ambulatory Status: Ambulatory With Help Assistance Device: Cane GaitCATERINE, [place] R ([id number]) [id number] Page 4 of 4 Electronic Signature(s) Signed: 03/23/2023 5:50:35 PM By: Shawn Stall RN, BSN Entered By: Shawn Stall on 03/23/2023 05:54:20 -------------------------------------------------------------------------------- Nutrition Risk Screening Details Patient Name: Date of Service: [name] MMA GE, [name] 03/23/2023 8:00 A M Medical Record Number: [id number] Patient Account Number: [id number] Date of Birth/Sex: Treating RN: 1986-06-05 (37 y.o. [name], [name] Primary Care Abbeygail Igoe: Sanda Linger Other Clinician: Referring Akeiba Axelson: Treating Arlone Lenhardt/Extender: Madelyn Flavors in Treatment: 0 Height (in): 69 Weight (lbs): 272 Body Mass Index (BMI): 40.2 Nutrition Risk Screening Items Score Screening NUTRITION RISK SCREEN: I have an illness or condition that made me change the kind and/or amount of food I eat 2 Yes I eat fewer than two meals per day 0 No I eat few fruits and vegetables, or milk products 0 No I have three or more drinks of beer, liquor or wine almost every day 0 No I have tooth or mouth problems that make it hard for me to eat 0 No I don't always have enough money to buy the food I need 0 No I eat alone most of the time 0 No I take three or more different prescribed or over-the-counter drugs a day 1 Yes Without wanting to, I have lost or gained 10 pounds in the last six months 0 No I am not always physically able to shop, cook and/or feed myself 0 No Nutrition Protocols Good Risk Protocol Moderate Risk Protocol 0 Provide education on nutrition High Risk Proctocol Risk Level: Moderate Risk Score: 3 Electronic Signature(s) Signed: 03/23/2023 5:50:35 PM By: Shawn Stall RN, BSN Entered By: Shawn Stall on 03/23/2023 20:25:42

## 2023-03-24 NOTE — Progress Notes (Addendum)
am not completely certain without a more exact diagnosis we will be able to do that. Electronic Signature(s) Signed: 03/26/2023 12:34:21 PM By: Shawn Stall RN, BSN Signed: 03/29/2023 3:56:56 PM By: Baltazar Najjar MD Previous Signature: 03/23/2023 4:34:08 PM Version By: Baltazar Najjar MD Entered By: Shawn Stall on 03/26/2023 09:31:05 -------------------------------------------------------------------------------- HxROS Details Patient Name: Date of Service: [name], [name] 03/23/2023 8:00 A M Medical Record Number: [id number] Patient Account Number: [id number] Date of Birth/Sex: Treating RN: 1986-01-29 (37 y.o. [name] Primary Care Provider: Sanda Linger Other Clinician: Referring Provider: Treating Provider/Extender: Madelyn Flavors in Treatment: 0 Integumentary (Skin) Complaints and  Symptoms: Positive for: Wounds - bilateral feet Constitutional Symptoms (General Health) Medical History: Past Medical History Notes: Thiamine deficiency neuropathy Thrombocytopenia vitamin b12 deficiency Hematologic/Lymphatic Medical History: Positive for: Anemia Respiratory Medical HistoryCHARLETA, [name] ([id number]) [id number] Page 10 of 11 Positive for: Sleep Apnea Cardiovascular Medical History: Positive for: Vasculitis Neurologic Medical History: Positive for: Neuropathy Psychiatric Medical History: Past Medical History Notes: anxiety depression Immunizations Implantable Devices No devices added Hospitalization / Surgery History Type of Hospitalization/Surgery 2014 Gastric bypass 2014 hernia repair Family and Social History Hypertension: Yes - Father,Mother; Never smoker; Marital Status - Single; Alcohol Use: Never; Drug Use: No History; Caffeine Use: Never; Financial Concerns: No; Food, Clothing or Shelter Needs: No; Support System Lacking: No; Transportation Concerns: No Electronic Signature(s) Signed: 03/23/2023 4:34:08 PM By: Baltazar Najjar MD Signed: 03/23/2023 5:50:35 PM By: Shawn Stall RN, BSN Previous Signature: 03/23/2023 8:12:05 AM Version By: Baltazar Najjar MD Entered By: Shawn Stall on 03/23/2023 05:17:35 -------------------------------------------------------------------------------- SuperBill Details Patient Name: Date of Service: [name], [name] 03/23/2023 Medical Record Number: [id number] Patient Account Number: [id number] Date of Birth/Sex: Treating RN: 1986/04/01 (37 y.o. 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 L95.8 Other vasculitis limited to the skin Facility Procedures : CPT4: Code 43329518 9921 Description: 3 - WOUND CARE VISIT-LEV 3 EST PT Modifier: Quantity: 1 Physician Procedures : CPT4 Code Description Modifier [id number] - WC PHYS LEVEL 4 - NEW PT ICD-10 Diagnosis Description L97.518 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 severity L97.528 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 Quantity: 1 Electronic Signature(s) Signed: 03/23/2023 4:34:08 PM By: Baltazar Najjar MD Entered By: Baltazar Najjar on 03/23/2023 09:53:00  am not completely certain without a more exact diagnosis we will be able to do that. Electronic Signature(s) Signed: 03/26/2023 12:34:21 PM By: Shawn Stall RN, BSN Signed: 03/29/2023 3:56:56 PM By: Baltazar Najjar MD Previous Signature: 03/23/2023 4:34:08 PM Version By: Baltazar Najjar MD Entered By: Shawn Stall on 03/26/2023 09:31:05 -------------------------------------------------------------------------------- HxROS Details Patient Name: Date of Service: [name], [name] 03/23/2023 8:00 A M Medical Record Number: [id number] Patient Account Number: [id number] Date of Birth/Sex: Treating RN: 1986-01-29 (37 y.o. [name] Primary Care Provider: Sanda Linger Other Clinician: Referring Provider: Treating Provider/Extender: Madelyn Flavors in Treatment: 0 Integumentary (Skin) Complaints and  Symptoms: Positive for: Wounds - bilateral feet Constitutional Symptoms (General Health) Medical History: Past Medical History Notes: Thiamine deficiency neuropathy Thrombocytopenia vitamin b12 deficiency Hematologic/Lymphatic Medical History: Positive for: Anemia Respiratory Medical HistoryCHARLETA, [name] ([id number]) [id number] Page 10 of 11 Positive for: Sleep Apnea Cardiovascular Medical History: Positive for: Vasculitis Neurologic Medical History: Positive for: Neuropathy Psychiatric Medical History: Past Medical History Notes: anxiety depression Immunizations Implantable Devices No devices added Hospitalization / Surgery History Type of Hospitalization/Surgery 2014 Gastric bypass 2014 hernia repair Family and Social History Hypertension: Yes - Father,Mother; Never smoker; Marital Status - Single; Alcohol Use: Never; Drug Use: No History; Caffeine Use: Never; Financial Concerns: No; Food, Clothing or Shelter Needs: No; Support System Lacking: No; Transportation Concerns: No Electronic Signature(s) Signed: 03/23/2023 4:34:08 PM By: Baltazar Najjar MD Signed: 03/23/2023 5:50:35 PM By: Shawn Stall RN, BSN Previous Signature: 03/23/2023 8:12:05 AM Version By: Baltazar Najjar MD Entered By: Shawn Stall on 03/23/2023 05:17:35 -------------------------------------------------------------------------------- SuperBill Details Patient Name: Date of Service: [name], [name] 03/23/2023 Medical Record Number: [id number] Patient Account Number: [id number] Date of Birth/Sex: Treating RN: 1986/04/01 (37 y.o. 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 L95.8 Other vasculitis limited to the skin Facility Procedures : CPT4: Code 43329518 9921 Description: 3 - WOUND CARE VISIT-LEV 3 EST PT Modifier: Quantity: 1 Physician Procedures : CPT4 Code Description Modifier [id number] - WC PHYS LEVEL 4 - NEW PT ICD-10 Diagnosis Description L97.518 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 severity L97.528 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 Quantity: 1 Electronic Signature(s) Signed: 03/23/2023 4:34:08 PM By: Baltazar Najjar MD Entered By: Baltazar Najjar on 03/23/2023 09:53:00  am not completely certain without a more exact diagnosis we will be able to do that. Electronic Signature(s) Signed: 03/26/2023 12:34:21 PM By: Shawn Stall RN, BSN Signed: 03/29/2023 3:56:56 PM By: Baltazar Najjar MD Previous Signature: 03/23/2023 4:34:08 PM Version By: Baltazar Najjar MD Entered By: Shawn Stall on 03/26/2023 09:31:05 -------------------------------------------------------------------------------- HxROS Details Patient Name: Date of Service: [name], [name] 03/23/2023 8:00 A M Medical Record Number: [id number] Patient Account Number: [id number] Date of Birth/Sex: Treating RN: 1986-01-29 (37 y.o. [name] Primary Care Provider: Sanda Linger Other Clinician: Referring Provider: Treating Provider/Extender: Madelyn Flavors in Treatment: 0 Integumentary (Skin) Complaints and  Symptoms: Positive for: Wounds - bilateral feet Constitutional Symptoms (General Health) Medical History: Past Medical History Notes: Thiamine deficiency neuropathy Thrombocytopenia vitamin b12 deficiency Hematologic/Lymphatic Medical History: Positive for: Anemia Respiratory Medical HistoryCHARLETA, [name] ([id number]) [id number] Page 10 of 11 Positive for: Sleep Apnea Cardiovascular Medical History: Positive for: Vasculitis Neurologic Medical History: Positive for: Neuropathy Psychiatric Medical History: Past Medical History Notes: anxiety depression Immunizations Implantable Devices No devices added Hospitalization / Surgery History Type of Hospitalization/Surgery 2014 Gastric bypass 2014 hernia repair Family and Social History Hypertension: Yes - Father,Mother; Never smoker; Marital Status - Single; Alcohol Use: Never; Drug Use: No History; Caffeine Use: Never; Financial Concerns: No; Food, Clothing or Shelter Needs: No; Support System Lacking: No; Transportation Concerns: No Electronic Signature(s) Signed: 03/23/2023 4:34:08 PM By: Baltazar Najjar MD Signed: 03/23/2023 5:50:35 PM By: Shawn Stall RN, BSN Previous Signature: 03/23/2023 8:12:05 AM Version By: Baltazar Najjar MD Entered By: Shawn Stall on 03/23/2023 05:17:35 -------------------------------------------------------------------------------- SuperBill Details Patient Name: Date of Service: [name], [name] 03/23/2023 Medical Record Number: [id number] Patient Account Number: [id number] Date of Birth/Sex: Treating RN: 1986/04/01 (37 y.o. 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 L95.8 Other vasculitis limited to the skin Facility Procedures : CPT4: Code 43329518 9921 Description: 3 - WOUND CARE VISIT-LEV 3 EST PT Modifier: Quantity: 1 Physician Procedures : CPT4 Code Description Modifier [id number] - WC PHYS LEVEL 4 - NEW PT ICD-10 Diagnosis Description L97.518 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 severity L97.528 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 Quantity: 1 Electronic Signature(s) Signed: 03/23/2023 4:34:08 PM By: Baltazar Najjar MD Entered By: Baltazar Najjar on 03/23/2023 09:53:00  am not completely certain without a more exact diagnosis we will be able to do that. Electronic Signature(s) Signed: 03/26/2023 12:34:21 PM By: Shawn Stall RN, BSN Signed: 03/29/2023 3:56:56 PM By: Baltazar Najjar MD Previous Signature: 03/23/2023 4:34:08 PM Version By: Baltazar Najjar MD Entered By: Shawn Stall on 03/26/2023 09:31:05 -------------------------------------------------------------------------------- HxROS Details Patient Name: Date of Service: [name], [name] 03/23/2023 8:00 A M Medical Record Number: [id number] Patient Account Number: [id number] Date of Birth/Sex: Treating RN: 1986-01-29 (37 y.o. [name] Primary Care Provider: Sanda Linger Other Clinician: Referring Provider: Treating Provider/Extender: Madelyn Flavors in Treatment: 0 Integumentary (Skin) Complaints and  Symptoms: Positive for: Wounds - bilateral feet Constitutional Symptoms (General Health) Medical History: Past Medical History Notes: Thiamine deficiency neuropathy Thrombocytopenia vitamin b12 deficiency Hematologic/Lymphatic Medical History: Positive for: Anemia Respiratory Medical HistoryCHARLETA, [name] ([id number]) [id number] Page 10 of 11 Positive for: Sleep Apnea Cardiovascular Medical History: Positive for: Vasculitis Neurologic Medical History: Positive for: Neuropathy Psychiatric Medical History: Past Medical History Notes: anxiety depression Immunizations Implantable Devices No devices added Hospitalization / Surgery History Type of Hospitalization/Surgery 2014 Gastric bypass 2014 hernia repair Family and Social History Hypertension: Yes - Father,Mother; Never smoker; Marital Status - Single; Alcohol Use: Never; Drug Use: No History; Caffeine Use: Never; Financial Concerns: No; Food, Clothing or Shelter Needs: No; Support System Lacking: No; Transportation Concerns: No Electronic Signature(s) Signed: 03/23/2023 4:34:08 PM By: Baltazar Najjar MD Signed: 03/23/2023 5:50:35 PM By: Shawn Stall RN, BSN Previous Signature: 03/23/2023 8:12:05 AM Version By: Baltazar Najjar MD Entered By: Shawn Stall on 03/23/2023 05:17:35 -------------------------------------------------------------------------------- SuperBill Details Patient Name: Date of Service: [name], [name] 03/23/2023 Medical Record Number: [id number] Patient Account Number: [id number] Date of Birth/Sex: Treating RN: 1986/04/01 (37 y.o. 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 L95.8 Other vasculitis limited to the skin Facility Procedures : CPT4: Code 43329518 9921 Description: 3 - WOUND CARE VISIT-LEV 3 EST PT Modifier: Quantity: 1 Physician Procedures : CPT4 Code Description Modifier [id number] - WC PHYS LEVEL 4 - NEW PT ICD-10 Diagnosis Description L97.518 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 severity L97.528 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 Quantity: 1 Electronic Signature(s) Signed: 03/23/2023 4:34:08 PM By: Baltazar Najjar MD Entered By: Baltazar Najjar on 03/23/2023 09:53:00  Details Patient Name: Date of Service: [name] NDRA R. 03/23/2023 8:00 A M Medical Record Number: [id number] Patient Account Number: [id number] Date of Birth/Sex: Treating RN: 27-Oct-1985 (37 y.o. F) Primary Care Provider: Sanda Linger Other Clinician: Referring Provider: Treating Provider/Extender: Madelyn Flavors in Treatment: 0 Constitutional Sitting or standing Blood Pressure is within target range for patient.. Pulse regular and within target range for patient.Marland Kitchen Respirations regular, non-labored and within target range.. Temperature is normal and within the target range for the patient.. Patient appears uncomfortable when walking but not acutely medically ill. Cardiovascular Pedal pulses are palpable. 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 In her lower extremities just below the knees she has small circular nodular areas which the patient thinks is also chronic and part of her psoriasis.. Notes Wound exam; the areas involved are both of her feet and ankles. Especially on the left she has exfoliated skin bilaterally on the dorsal feet. Towards the base of her toes on the left punched-out necrotic areas which look ischemic. She also has an area on her right lateral heel with small punched-out necrotic looking wounds. She states that these also may have been present more chronically from trauma. There is also smaller areas on both dorsal feet Electronic Signature(s) Signed: 03/23/2023 4:34:08 PM By: Baltazar Najjar MD Entered By: Baltazar Najjar on 03/23/2023 09:47:52 -------------------------------------------------------------------------------- Physician Orders Details Patient Name: Date of  Service: [name], [name] 03/23/2023 8:00 A M Medical Record Number: [id number] Patient Account Number: [id number] Date of Birth/Sex: Treating RN: Oct 24, 1985 (37 y.o. [name] Primary Care Provider: Sanda Linger Other Clinician: Referring Provider: Treating Provider/Extender: Madelyn Flavors in Treatment: 0 Verbal / Phone Orders: No Diagnosis Coding Follow-up Appointments ppointment in 1 week. - Dr. Leanord Hawking 115pm Tuesday 03/30/2023 room 8 Return A ppointment in 2 weeks. - Dr. Leanord Hawking 04/06/2023 115pm Room 8 Return A Return appointment in 3 weeks. - Dr. Leanord Hawking (front office to schedule) Bathing/ Shower/ Hygiene May shower with protection but do not get wound dressing(s) wet. Protect dressing(s) with water repellant cover (for example, large plastic bag) or a cast cover and may then take shower. Edema Control - Lymphedema / SCD / Other Elevate legs to the level of the heart or above for 30 minutes daily and/or when sitting for 3-4 times a day throughout the day. Avoid standing for long periods of time. Exercise regularly Wound Treatment Wound #1 - Foot Wound Laterality: Left, Medial Cleanser: Soap and Water 1 x Per Week/30 Days Discharge Instructions: May shower and wash wound with dial antibacterial soap and water prior to dressing change. JERMERIA, TELLES ([id number]) [id number] Page 3 of 11 Cleanser: Vashe 5.8 (oz) 1 x Per Week/30 Days Discharge Instructions: Cleanse the wound with Vashe prior to applying a clean dressing using gauze sponges, not tissue or cotton balls. Peri-Wound Care: Triamcinolone 15 (g) 1 x Per Week/30 Days Discharge Instructions: Use triamcinolone 15 (g) applied liberally. Peri-Wound Care: Sween Lotion (Moisturizing lotion) 1 x Per Week/30 Days Discharge Instructions: Apply moisturizing lotion as directed Topical: Triamcinolone 1 x Per Week/30 Days Discharge Instructions: Apply Triamcinolone as  directed Prim Dressing: Xeroform Occlusive Gauze Dressing, 4x4 in 1 x Per Week/30 Days ary Discharge Instructions: Apply to wound bed as instructed Secondary Dressing: ABD Pad, 8x10 1 x Per Week/30 Days Discharge Instructions: Apply over primary dressing as directed. Secondary Dressing:  am not completely certain without a more exact diagnosis we will be able to do that. Electronic Signature(s) Signed: 03/26/2023 12:34:21 PM By: Shawn Stall RN, BSN Signed: 03/29/2023 3:56:56 PM By: Baltazar Najjar MD Previous Signature: 03/23/2023 4:34:08 PM Version By: Baltazar Najjar MD Entered By: Shawn Stall on 03/26/2023 09:31:05 -------------------------------------------------------------------------------- HxROS Details Patient Name: Date of Service: [name], [name] 03/23/2023 8:00 A M Medical Record Number: [id number] Patient Account Number: [id number] Date of Birth/Sex: Treating RN: 1986-01-29 (37 y.o. [name] Primary Care Provider: Sanda Linger Other Clinician: Referring Provider: Treating Provider/Extender: Madelyn Flavors in Treatment: 0 Integumentary (Skin) Complaints and  Symptoms: Positive for: Wounds - bilateral feet Constitutional Symptoms (General Health) Medical History: Past Medical History Notes: Thiamine deficiency neuropathy Thrombocytopenia vitamin b12 deficiency Hematologic/Lymphatic Medical History: Positive for: Anemia Respiratory Medical HistoryCHARLETA, [name] ([id number]) [id number] Page 10 of 11 Positive for: Sleep Apnea Cardiovascular Medical History: Positive for: Vasculitis Neurologic Medical History: Positive for: Neuropathy Psychiatric Medical History: Past Medical History Notes: anxiety depression Immunizations Implantable Devices No devices added Hospitalization / Surgery History Type of Hospitalization/Surgery 2014 Gastric bypass 2014 hernia repair Family and Social History Hypertension: Yes - Father,Mother; Never smoker; Marital Status - Single; Alcohol Use: Never; Drug Use: No History; Caffeine Use: Never; Financial Concerns: No; Food, Clothing or Shelter Needs: No; Support System Lacking: No; Transportation Concerns: No Electronic Signature(s) Signed: 03/23/2023 4:34:08 PM By: Baltazar Najjar MD Signed: 03/23/2023 5:50:35 PM By: Shawn Stall RN, BSN Previous Signature: 03/23/2023 8:12:05 AM Version By: Baltazar Najjar MD Entered By: Shawn Stall on 03/23/2023 05:17:35 -------------------------------------------------------------------------------- SuperBill Details Patient Name: Date of Service: [name], [name] 03/23/2023 Medical Record Number: [id number] Patient Account Number: [id number] Date of Birth/Sex: Treating RN: 1986/04/01 (37 y.o. 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 L95.8 Other vasculitis limited to the skin Facility Procedures : CPT4: Code 43329518 9921 Description: 3 - WOUND CARE VISIT-LEV 3 EST PT Modifier: Quantity: 1 Physician Procedures : CPT4 Code Description Modifier [id number] - WC PHYS LEVEL 4 - NEW PT ICD-10 Diagnosis Description L97.518 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 severity L97.528 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 Quantity: 1 Electronic Signature(s) Signed: 03/23/2023 4:34:08 PM By: Baltazar Najjar MD Entered By: Baltazar Najjar on 03/23/2023 09:53:00  Details Patient Name: Date of Service: [name] NDRA R. 03/23/2023 8:00 A M Medical Record Number: [id number] Patient Account Number: [id number] Date of Birth/Sex: Treating RN: 27-Oct-1985 (37 y.o. F) Primary Care Provider: Sanda Linger Other Clinician: Referring Provider: Treating Provider/Extender: Madelyn Flavors in Treatment: 0 Constitutional Sitting or standing Blood Pressure is within target range for patient.. Pulse regular and within target range for patient.Marland Kitchen Respirations regular, non-labored and within target range.. Temperature is normal and within the target range for the patient.. Patient appears uncomfortable when walking but not acutely medically ill. Cardiovascular Pedal pulses are palpable. 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 In her lower extremities just below the knees she has small circular nodular areas which the patient thinks is also chronic and part of her psoriasis.. Notes Wound exam; the areas involved are both of her feet and ankles. Especially on the left she has exfoliated skin bilaterally on the dorsal feet. Towards the base of her toes on the left punched-out necrotic areas which look ischemic. She also has an area on her right lateral heel with small punched-out necrotic looking wounds. She states that these also may have been present more chronically from trauma. There is also smaller areas on both dorsal feet Electronic Signature(s) Signed: 03/23/2023 4:34:08 PM By: Baltazar Najjar MD Entered By: Baltazar Najjar on 03/23/2023 09:47:52 -------------------------------------------------------------------------------- Physician Orders Details Patient Name: Date of  Service: [name], [name] 03/23/2023 8:00 A M Medical Record Number: [id number] Patient Account Number: [id number] Date of Birth/Sex: Treating RN: Oct 24, 1985 (37 y.o. [name] Primary Care Provider: Sanda Linger Other Clinician: Referring Provider: Treating Provider/Extender: Madelyn Flavors in Treatment: 0 Verbal / Phone Orders: No Diagnosis Coding Follow-up Appointments ppointment in 1 week. - Dr. Leanord Hawking 115pm Tuesday 03/30/2023 room 8 Return A ppointment in 2 weeks. - Dr. Leanord Hawking 04/06/2023 115pm Room 8 Return A Return appointment in 3 weeks. - Dr. Leanord Hawking (front office to schedule) Bathing/ Shower/ Hygiene May shower with protection but do not get wound dressing(s) wet. Protect dressing(s) with water repellant cover (for example, large plastic bag) or a cast cover and may then take shower. Edema Control - Lymphedema / SCD / Other Elevate legs to the level of the heart or above for 30 minutes daily and/or when sitting for 3-4 times a day throughout the day. Avoid standing for long periods of time. Exercise regularly Wound Treatment Wound #1 - Foot Wound Laterality: Left, Medial Cleanser: Soap and Water 1 x Per Week/30 Days Discharge Instructions: May shower and wash wound with dial antibacterial soap and water prior to dressing change. JERMERIA, TELLES ([id number]) [id number] Page 3 of 11 Cleanser: Vashe 5.8 (oz) 1 x Per Week/30 Days Discharge Instructions: Cleanse the wound with Vashe prior to applying a clean dressing using gauze sponges, not tissue or cotton balls. Peri-Wound Care: Triamcinolone 15 (g) 1 x Per Week/30 Days Discharge Instructions: Use triamcinolone 15 (g) applied liberally. Peri-Wound Care: Sween Lotion (Moisturizing lotion) 1 x Per Week/30 Days Discharge Instructions: Apply moisturizing lotion as directed Topical: Triamcinolone 1 x Per Week/30 Days Discharge Instructions: Apply Triamcinolone as  directed Prim Dressing: Xeroform Occlusive Gauze Dressing, 4x4 in 1 x Per Week/30 Days ary Discharge Instructions: Apply to wound bed as instructed Secondary Dressing: ABD Pad, 8x10 1 x Per Week/30 Days Discharge Instructions: Apply over primary dressing as directed. Secondary Dressing:  am not completely certain without a more exact diagnosis we will be able to do that. Electronic Signature(s) Signed: 03/26/2023 12:34:21 PM By: Shawn Stall RN, BSN Signed: 03/29/2023 3:56:56 PM By: Baltazar Najjar MD Previous Signature: 03/23/2023 4:34:08 PM Version By: Baltazar Najjar MD Entered By: Shawn Stall on 03/26/2023 09:31:05 -------------------------------------------------------------------------------- HxROS Details Patient Name: Date of Service: [name], [name] 03/23/2023 8:00 A M Medical Record Number: [id number] Patient Account Number: [id number] Date of Birth/Sex: Treating RN: 1986-01-29 (37 y.o. [name] Primary Care Provider: Sanda Linger Other Clinician: Referring Provider: Treating Provider/Extender: Madelyn Flavors in Treatment: 0 Integumentary (Skin) Complaints and  Symptoms: Positive for: Wounds - bilateral feet Constitutional Symptoms (General Health) Medical History: Past Medical History Notes: Thiamine deficiency neuropathy Thrombocytopenia vitamin b12 deficiency Hematologic/Lymphatic Medical History: Positive for: Anemia Respiratory Medical HistoryCHARLETA, [name] ([id number]) [id number] Page 10 of 11 Positive for: Sleep Apnea Cardiovascular Medical History: Positive for: Vasculitis Neurologic Medical History: Positive for: Neuropathy Psychiatric Medical History: Past Medical History Notes: anxiety depression Immunizations Implantable Devices No devices added Hospitalization / Surgery History Type of Hospitalization/Surgery 2014 Gastric bypass 2014 hernia repair Family and Social History Hypertension: Yes - Father,Mother; Never smoker; Marital Status - Single; Alcohol Use: Never; Drug Use: No History; Caffeine Use: Never; Financial Concerns: No; Food, Clothing or Shelter Needs: No; Support System Lacking: No; Transportation Concerns: No Electronic Signature(s) Signed: 03/23/2023 4:34:08 PM By: Baltazar Najjar MD Signed: 03/23/2023 5:50:35 PM By: Shawn Stall RN, BSN Previous Signature: 03/23/2023 8:12:05 AM Version By: Baltazar Najjar MD Entered By: Shawn Stall on 03/23/2023 05:17:35 -------------------------------------------------------------------------------- SuperBill Details Patient Name: Date of Service: [name], [name] 03/23/2023 Medical Record Number: [id number] Patient Account Number: [id number] Date of Birth/Sex: Treating RN: 1986/04/01 (37 y.o. 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 L95.8 Other vasculitis limited to the skin Facility Procedures : CPT4: Code 43329518 9921 Description: 3 - WOUND CARE VISIT-LEV 3 EST PT Modifier: Quantity: 1 Physician Procedures : CPT4 Code Description Modifier [id number] - WC PHYS LEVEL 4 - NEW PT ICD-10 Diagnosis Description L97.518 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 severity L97.528 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 Quantity: 1 Electronic Signature(s) Signed: 03/23/2023 4:34:08 PM By: Baltazar Najjar MD Entered By: Baltazar Najjar on 03/23/2023 09:53:00  am not completely certain without a more exact diagnosis we will be able to do that. Electronic Signature(s) Signed: 03/26/2023 12:34:21 PM By: Shawn Stall RN, BSN Signed: 03/29/2023 3:56:56 PM By: Baltazar Najjar MD Previous Signature: 03/23/2023 4:34:08 PM Version By: Baltazar Najjar MD Entered By: Shawn Stall on 03/26/2023 09:31:05 -------------------------------------------------------------------------------- HxROS Details Patient Name: Date of Service: [name], [name] 03/23/2023 8:00 A M Medical Record Number: [id number] Patient Account Number: [id number] Date of Birth/Sex: Treating RN: 1986-01-29 (37 y.o. [name] Primary Care Provider: Sanda Linger Other Clinician: Referring Provider: Treating Provider/Extender: Madelyn Flavors in Treatment: 0 Integumentary (Skin) Complaints and  Symptoms: Positive for: Wounds - bilateral feet Constitutional Symptoms (General Health) Medical History: Past Medical History Notes: Thiamine deficiency neuropathy Thrombocytopenia vitamin b12 deficiency Hematologic/Lymphatic Medical History: Positive for: Anemia Respiratory Medical HistoryCHARLETA, [name] ([id number]) [id number] Page 10 of 11 Positive for: Sleep Apnea Cardiovascular Medical History: Positive for: Vasculitis Neurologic Medical History: Positive for: Neuropathy Psychiatric Medical History: Past Medical History Notes: anxiety depression Immunizations Implantable Devices No devices added Hospitalization / Surgery History Type of Hospitalization/Surgery 2014 Gastric bypass 2014 hernia repair Family and Social History Hypertension: Yes - Father,Mother; Never smoker; Marital Status - Single; Alcohol Use: Never; Drug Use: No History; Caffeine Use: Never; Financial Concerns: No; Food, Clothing or Shelter Needs: No; Support System Lacking: No; Transportation Concerns: No Electronic Signature(s) Signed: 03/23/2023 4:34:08 PM By: Baltazar Najjar MD Signed: 03/23/2023 5:50:35 PM By: Shawn Stall RN, BSN Previous Signature: 03/23/2023 8:12:05 AM Version By: Baltazar Najjar MD Entered By: Shawn Stall on 03/23/2023 05:17:35 -------------------------------------------------------------------------------- SuperBill Details Patient Name: Date of Service: [name], [name] 03/23/2023 Medical Record Number: [id number] Patient Account Number: [id number] Date of Birth/Sex: Treating RN: 1986/04/01 (37 y.o. 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 L95.8 Other vasculitis limited to the skin Facility Procedures : CPT4: Code 43329518 9921 Description: 3 - WOUND CARE VISIT-LEV 3 EST PT Modifier: Quantity: 1 Physician Procedures : CPT4 Code Description Modifier [id number] - WC PHYS LEVEL 4 - NEW PT ICD-10 Diagnosis Description L97.518 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 severity L97.528 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 Quantity: 1 Electronic Signature(s) Signed: 03/23/2023 4:34:08 PM By: Baltazar Najjar MD Entered By: Baltazar Najjar on 03/23/2023 09:53:00  Per Week/30 Days Discharge Instructions: Apply Triamcinolone as directed Prim Dressing: Xeroform Occlusive Gauze Dressing, 4x4 in 1 x Per Week/30 Days ary Discharge Instructions: Apply to wound bed as instructed Secondary Dressing: ABD Pad, 8x10 1 x Per Week/30 Days Discharge Instructions: Apply over primary dressing as directed. Secondary Dressing: Woven Gauze Sponge, Non-Sterile 4x4 in 1 x Per Week/30 Days Discharge Instructions: Apply over primary dressing as directed. Compression Wrap: Kerlix Roll 4.5x3.1 (in/yd) 1 x Per Week/30 Days Discharge Instructions: Apply Kerlix and Coban compression as directed. Compression Wrap: Coban Self-Adherent Wrap 4x5 (in/yd) 1 x Per Week/30 Days Discharge Instructions: Apply over Kerlix as directed. Wound #5 - Calcaneus Wound Laterality: Right, Lateral Cleanser: Soap and Water 1 x Per Week/30 Days Discharge Instructions: May shower and wash wound with dial antibacterial soap and water prior to dressing change. Cleanser: Vashe 5.8 (oz) 1 x Per Week/30 Days Discharge Instructions: Cleanse the wound with Vashe prior to applying a clean dressing using gauze sponges, not tissue or cotton balls. Peri-Wound Care: Triamcinolone 15 (g) 1 x Per Week/30 Days Discharge Instructions: Use triamcinolone 15 (g) applied liberally. Peri-Wound Care: Sween Lotion (Moisturizing lotion) 1 x Per Week/30 Days Discharge Instructions: Apply moisturizing lotion as directed Topical: Triamcinolone 1 x Per  Week/30 Days Discharge Instructions: Apply Triamcinolone as directed Prim Dressing: Xeroform Occlusive Gauze Dressing, 4x4 in 1 x Per Week/30 Days ary Discharge Instructions: Apply to wound bed as instructed Secondary Dressing: ABD Pad, 8x10 1 x Per Week/30 Days Discharge Instructions: Apply over primary dressing as directed. Secondary Dressing: Woven Gauze Sponge, Non-Sterile 4x4 in 1 x Per Week/30 Days Discharge Instructions: Apply over primary dressing as directed. Compression Wrap: Kerlix Roll 4.5x3.1 (in/yd) 1 x Per Week/30 Days Discharge Instructions: Apply Kerlix and Coban compression as directed. Compression Wrap: Coban Self-Adherent Wrap 4x5 (in/yd) 1 x Per Week/30 Days Discharge Instructions: Apply over Kerlix as directed. [name], [name] ([id number]) [id number] Page 5 of 11 Electronic Signature(s) Signed: 03/23/2023 4:34:08 PM By: Baltazar Najjar MD Signed: 03/23/2023 5:50:35 PM By: Shawn Stall RN, BSN Entered By: Shawn Stall on 03/23/2023 06:56:25 -------------------------------------------------------------------------------- Problem List Details Patient Name: Date of Service: [name], [name] 03/23/2023 8:00 A M Medical Record Number: [id number] Patient Account Number: [id number] Date of Birth/Sex: Treating RN: 02-17-1986 (37 y.o. 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 03/23/2023 8:00 A M Medical Record Number: [id number] Patient Account Number: [id number] Date of Birth/Sex: Treating RN: [birth year] (37 y.o. F) Primary Care Provider: Sanda Linger Other Clinician: Referring Provider: Treating Provider/Extender: Madelyn Flavors in Treatment: 0 Subjective Chief Complaint Information obtained from Patient 03/23/2023; patient is here for review of wounds on her bilateral feet and ankles History of Present Illness (HPI) [name], [name] ([id number]) [id number] Page 6 of 11 ADMISSION 03/23/2023 This is a 37 year old woman who is Story is somewhat difficult to follow however she apparently has developed bilateral

## 2023-03-25 ENCOUNTER — Telehealth: Payer: Self-pay | Admitting: Internal Medicine

## 2023-03-25 NOTE — Telephone Encounter (Signed)
We have received disability forms from [employer] and they have been placed in the provider's box.   Please fax to: [phone]

## 2023-03-26 NOTE — Telephone Encounter (Signed)
Duplicate forms of what was faxed back on 9/16.  Will fax completed forms again.

## 2023-03-29 ENCOUNTER — Other Ambulatory Visit: Payer: Self-pay | Admitting: Internal Medicine

## 2023-03-30 ENCOUNTER — Encounter (HOSPITAL_BASED_OUTPATIENT_CLINIC_OR_DEPARTMENT_OTHER): Payer: BC Managed Care – PPO | Admitting: Internal Medicine

## 2023-03-30 ENCOUNTER — Encounter: Payer: Self-pay | Admitting: Internal Medicine

## 2023-03-30 ENCOUNTER — Telehealth: Payer: Self-pay | Admitting: Radiology

## 2023-03-30 ENCOUNTER — Ambulatory Visit: Payer: BC Managed Care – PPO | Admitting: Internal Medicine

## 2023-03-30 VITALS — BP 118/76 | HR 114 | Temp 98.1°F | Resp 16 | Ht 69.0 in | Wt 287.0 lb

## 2023-03-30 DIAGNOSIS — I776 Arteritis, unspecified: Secondary | ICD-10-CM

## 2023-03-30 DIAGNOSIS — B159 Hepatitis A without hepatic coma: Secondary | ICD-10-CM | POA: Diagnosis not present

## 2023-03-30 DIAGNOSIS — D696 Thrombocytopenia, unspecified: Secondary | ICD-10-CM | POA: Diagnosis not present

## 2023-03-30 DIAGNOSIS — E538 Deficiency of other specified B group vitamins: Secondary | ICD-10-CM

## 2023-03-30 DIAGNOSIS — I10 Essential (primary) hypertension: Secondary | ICD-10-CM | POA: Diagnosis not present

## 2023-03-30 DIAGNOSIS — G63 Polyneuropathy in diseases classified elsewhere: Secondary | ICD-10-CM

## 2023-03-30 DIAGNOSIS — L97518 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 severity: Secondary | ICD-10-CM | POA: Diagnosis not present

## 2023-03-30 DIAGNOSIS — L97528 Non-pressure chronic ulcer of other part of left foot with other specified severity: Secondary | ICD-10-CM | POA: Diagnosis not present

## 2023-03-30 DIAGNOSIS — G5793 Unspecified mononeuropathy of bilateral lower limbs: Secondary | ICD-10-CM

## 2023-03-30 DIAGNOSIS — Z6841 Body Mass Index (BMI) 40.0 and over, adult: Secondary | ICD-10-CM | POA: Diagnosis not present

## 2023-03-30 DIAGNOSIS — G990 Autonomic neuropathy in diseases classified elsewhere: Secondary | ICD-10-CM

## 2023-03-30 DIAGNOSIS — K769 Liver disease, unspecified: Secondary | ICD-10-CM

## 2023-03-30 DIAGNOSIS — S91302A Unspecified open wound, left foot, initial encounter: Secondary | ICD-10-CM | POA: Diagnosis not present

## 2023-03-30 DIAGNOSIS — S91301A Unspecified open wound, right foot, initial encounter: Secondary | ICD-10-CM | POA: Diagnosis not present

## 2023-03-30 DIAGNOSIS — E669 Obesity, unspecified: Secondary | ICD-10-CM | POA: Diagnosis not present

## 2023-03-30 DIAGNOSIS — G8929 Other chronic pain: Secondary | ICD-10-CM | POA: Diagnosis not present

## 2023-03-30 DIAGNOSIS — L409 Psoriasis, unspecified: Secondary | ICD-10-CM | POA: Diagnosis not present

## 2023-03-30 DIAGNOSIS — I890 Lymphedema, not elsewhere classified: Secondary | ICD-10-CM | POA: Diagnosis not present

## 2023-03-30 DIAGNOSIS — E876 Hypokalemia: Secondary | ICD-10-CM

## 2023-03-30 DIAGNOSIS — Z124 Encounter for screening for malignant neoplasm of cervix: Secondary | ICD-10-CM

## 2023-03-30 LAB — HEPATIC FUNCTION PANEL
ALT: 16 U/L (ref 0–35)
AST: 18 U/L (ref 0–37)
Albumin: 3.7 g/dL (ref 3.5–5.2)
Alkaline Phosphatase: 84 U/L (ref 39–117)
Bilirubin, Direct: 0.1 mg/dL (ref 0.0–0.3)
Total Bilirubin: 0.5 mg/dL (ref 0.2–1.2)
Total Protein: 7 g/dL (ref 6.0–8.3)

## 2023-03-30 LAB — BASIC METABOLIC PANEL
BUN: 11 mg/dL (ref 6–23)
CO2: 33 mEq/L — ABNORMAL HIGH (ref 19–32)
Calcium: 9.3 mg/dL (ref 8.4–10.5)
Chloride: 96 mEq/L (ref 96–112)
Creatinine, Ser: 0.63 mg/dL (ref 0.40–1.20)
GFR: 113.08 mL/min (ref >60.00)
Glucose, Bld: 94 mg/dL (ref 70–99)
Potassium: 2.6 mEq/L — CL (ref 3.5–5.1)
Sodium: 140 mEq/L (ref 135–145)

## 2023-03-30 LAB — SEDIMENTATION RATE: Sed Rate: 61 mm/hr — ABNORMAL HIGH (ref 0–20)

## 2023-03-30 MED ORDER — HYDROMORPHONE HCL 2 MG PO TABS: 2.0000 mg | ORAL_TABLET | Freq: Four times a day (QID) | ORAL | 0 refills | Status: DC | PRN

## 2023-03-30 MED ORDER — SPIRONOLACTONE 25 MG PO TABS
25.0000 mg | ORAL_TABLET | Freq: Every day | ORAL | 0 refills | Status: DC
Start: 2023-03-30 — End: 2023-06-29

## 2023-03-30 NOTE — Patient Instructions (Signed)
Hepatitis A Hepatitis A is a liver infection that is caused by a germ (virus). Hepatitis A can spread from person to person. This means it is contagious. What are the causes? This condition is caused by a germ. The germ may be spread when you: Eat food or drink water that has the germ in it (is contaminated). Have sex with someone who has the germ. Touch poop (stool) from an infected person and then touch your mouth with your hands. What increases the risk? The following factors may make you more likely to get this condition: Having contact with needles or syringes that have the germ in them. This may happen when you inject drugs. Not being able to get clean water or food. Working at a day care or nursing home where you may touch poop. Having HIV, AIDS, or long-term liver disease. Living in or traveling to countries where this infection is common. Being a female who has sex with other males. Having oral, vaginal, or anal sex with someone who has the germ. Having a bleeding disorder. What are the signs or symptoms? Not wanting to eat as much as normal. Fever or feeling tired. Feeling like you may vomit or you vomit. Belly pain. Dark yellow pee, light-colored poop, or both. Your skin or the white parts of your eyes turn yellow (jaundice). Itchy skin. Joint pain. Often, there are no symptoms. How is this treated? This condition usually goes away on its own after many weeks or months. Very bad cases are rare. If your illness is very bad, you may need hospital care. Follow these instructions at home: Medicines Take over-the-counter and prescription medicines only as told by your doctor. Do not take any over-the-counter medicines that have acetaminophen. Activity Get lots of sleep. Return to your normal activities as told by your doctor. Ask your doctor what activities are safe for you. Ask your doctor when you may return to school or work. Lifestyle  Do not smoke or use any products  that contain nicotine or tobacco. If you need help quitting, ask your doctor. Until your doctor says that you can: Do not have sex. Do not drink alcohol. Avoid swimming and using hot tubs. General instructions  Eat a healthy diet with plenty of: Fruits and vegetables. Whole grains. Low-fat (lean) meats. Non-meat proteins, such as beans or tofu. Wash your hands often with soap and water for at least 20 seconds. If you cannot use soap and water, use hand sanitizer. Make sure you wash your hands: After you use the bathroom. After you change diapers. Before you touch food or water. Tell your doctor about all the people you live with or have close contact with. They may need the hepatitis A vaccine. Follow instructions about how to avoid spreading the germ. Keep all follow-up visits. How is this prevented? Get the hepatitis A vaccine. If you were exposed to the germ, your doctor may want you to get one of the following: Human immunoglobulin. This fights germs in the body. The hepatitis A vaccine. Even if you got this vaccine as a child, a booster shot may help. Wash your hands often with soap and water for at least 20 seconds. If you travel to a developing country: Avoid food that is raw or not cooked enough. Drink bottled water only. Use bottled water to brush your teeth, make ice, and wash foods. Use a condom during vaginal, oral, or anal sex. Latex condoms work best. Where to find more Pharmacologist for Disease Control  and Prevention: SaveSearches.co.nz World Health Organization: https://castaneda-walker.com/ Contact a doctor if: You have a fever or chills. Your symptoms get worse. Get help right away if: You cannot eat or drink. You cannot eat or drink without vomiting. You feel confused. You have yellowing of the skin and the whites of your eyes that gets worse. You are very sleepy. You have trouble waking up. You bleed or bruise often. Summary Hepatitis A is a liver infection  that is caused by a germ (virus). The germ can be spread by eating food or drinking water that has the germ in it. Take all medicine only as told by your doctor. Wash your hands often with soap and water for at least 20 seconds. This helps to prevent infection. This information is not intended to replace advice given to you by your health care provider. Make sure you discuss any questions you have with your health care provider. Document Revised: 05/01/2020 Document Reviewed: 05/01/2020 Elsevier Patient Education  2022 [employer].

## 2023-03-30 NOTE — Telephone Encounter (Signed)
Ask her to stop dyazide and torsemide and start spironolactone

## 2023-03-30 NOTE — Progress Notes (Signed)
Subjective:  Patient ID: [name], female    DOB: September 20, 1985  Age: 37 y.o. MRN: [id number]  CC: Hypertension   HPI Ladene Gilkeson presents for f/up ----  Discussed the use of AI scribe software for clinical note transcription with the patient, who gave verbal consent to proceed.  History of Present Illness   The patient, with a recent diagnosis of Hepatitis A, presents with ongoing pain and wound care needs. They report no known exposure to suspicious food or water, and the source of the infection remains unclear. The patient's wounds are being managed by a wound center, with the last visit occurring on Tuesday afternoon.  Pain management is achieved through a regimen of oxycodone and hydromorphone, taken every four hours on a rotating schedule as needed. Despite the pain, the patient reports some improvement in their condition.  The patient is also on a course of steroids, with no significant side effects noted. They report pre-existing anxiety and sleep disturbances, which have not been exacerbated by the steroid treatment. In fact, the patient notes some improvement in sleep due to pain reduction.       Outpatient Medications Prior to Visit  Medication Sig Dispense Refill   doxepin (SINEQUAN) 50 MG capsule Take 50 mg by mouth at bedtime as needed (sleep).     DULoxetine (CYMBALTA) 20 MG capsule Take 20 mg by mouth daily.     Ferric Maltol (ACCRUFER) 30 MG CAPS Take 1 capsule (30 mg total) by mouth in the morning and at bedtime. 180 capsule 1   folic acid (FOLVITE) 1 MG tablet Take 1 tablet (1 mg total) by mouth daily. 90 tablet 1   Ixekizumab (TALTZ) 80 MG/ML SOSY Inject 80 mg into the skin every 30 (thirty) days.     levalbuterol (XOPENEX HFA) 45 MCG/ACT inhaler INHALE 2 PUFFS INTO THE LUNGS EVERY 6 HOURS AS NEEDED FOR WHEEZE 15 each 3   nebivolol (BYSTOLIC) 10 MG tablet Take 1 tablet (10 mg total) by mouth daily. 90 tablet 0   potassium chloride (KLOR-CON M10) 10  MEQ tablet Take 1 tablet (10 mEq total) by mouth 2 (two) times daily. 180 tablet 0   prazosin (MINIPRESS) 1 MG capsule Take 1 mg by mouth daily.     predniSONE (DELTASONE) 10 MG tablet Take 6 tablets (60 mg total) by mouth daily with breakfast for 7 days, THEN 5 tablets (50 mg total) daily with breakfast for 7 days, THEN 4 tablets (40 mg total) daily with breakfast for 7 days, THEN 3 tablets (30 mg total) daily with breakfast for 7 days, THEN 2 tablets (20 mg total) daily with breakfast for 7 days, THEN 1 tablet (10 mg total) daily with breakfast for 7 days, THEN 0.5 tablets (5 mg total) daily with breakfast for 7 days. 151 tablet 0   triamterene-hydrochlorothiazide (DYAZIDE) 37.5-25 MG capsule Take 1 each (1 capsule total) by mouth daily. 90 capsule 0   Vitamin D, Ergocalciferol, (DRISDOL) 1.25 MG (50000 UNIT) CAPS capsule Take 1 capsule (50,000 Units total) by mouth every 7 (seven) days. 12 capsule 0   zinc gluconate 50 MG tablet Take 1 tablet (50 mg total) by mouth daily. 90 tablet 1   HYDROmorphone (DILAUDID) 2 MG tablet Take 1 tablet (2 mg total) by mouth every 6 (six) hours as needed for severe pain. (Patient taking differently: Take 2 mg by mouth as needed for severe pain or moderate pain.) 100 tablet 0   torsemide (DEMADEX) 20 MG tablet  Take 1 tablet (20 mg total) by mouth daily. 90 tablet 0   traMADol (ULTRAM-ER) 100 MG 24 hr tablet Take 200 mg by mouth as needed for pain.     No facility-administered medications prior to visit.    ROS Review of Systems  Constitutional:  Positive for fatigue. Negative for appetite change, diaphoresis and unexpected weight change.  HENT: Negative.    Eyes: Negative.   Respiratory:  Negative for cough, chest tightness, shortness of breath and wheezing.   Cardiovascular:  Positive for leg swelling. Negative for chest pain and palpitations.  Gastrointestinal: Negative.  Negative for abdominal pain, diarrhea, nausea and vomiting.  Genitourinary: Negative.   Negative for difficulty urinating, dysuria and hematuria.  Skin:  Positive for wound.  Neurological: Negative.  Negative for dizziness and weakness.  Hematological:  Negative for adenopathy. Does not bruise/bleed easily.  Psychiatric/Behavioral: Negative.      Objective:  BP 118/76 (BP Location: Right Arm, Patient Position: Sitting, Cuff Size: Large)   Pulse (!) 114   Temp 98.1 F (36.7 C) (Oral)   Resp 16   Ht 5\' 9"  (1.753 m)   Wt 287 lb (130.2 kg)   LMP 03/09/2023 (Exact Date)   SpO2 96%   BMI 42.38 kg/m   BP Readings from Last 3 Encounters:  03/30/23 118/76  03/19/23 (!) 129/97  03/10/23 122/76    Wt Readings from Last 3 Encounters:  03/30/23 287 lb (130.2 kg)  03/16/23 276 lb 0.3 oz (125.2 kg)  03/01/23 276 lb (125.2 kg)    Physical Exam Vitals reviewed.  Constitutional:      General: She is not in acute distress.    Appearance: She is ill-appearing. She is not toxic-appearing or diaphoretic.  HENT:     Nose: Nose normal.     Mouth/Throat:     Mouth: Mucous membranes are moist.  Eyes:     General: No scleral icterus.    Conjunctiva/sclera: Conjunctivae normal.  Cardiovascular:     Rate and Rhythm: Normal rate and regular rhythm.     Heart sounds: No murmur heard.    No friction rub. No gallop.  Pulmonary:     Effort: Pulmonary effort is normal.     Breath sounds: No stridor. No wheezing, rhonchi or rales.  Abdominal:     General: Abdomen is flat. There is no distension.     Palpations: There is no mass.     Tenderness: There is no abdominal tenderness. There is no guarding.     Hernia: No hernia is present.  Musculoskeletal:        General: Normal range of motion.     Cervical back: Neck supple.     Right lower leg: Edema present.     Left lower leg: Edema present.  Lymphadenopathy:     Cervical: No cervical adenopathy.  Skin:    General: Skin is warm and dry.  Neurological:     General: No focal deficit present.     Mental Status: She is alert.  Mental status is at baseline.  Psychiatric:        Mood and Affect: Mood normal.        Behavior: Behavior normal.     Lab Results  Component Value Date   WBC 11.3 (H) 03/18/2023   HGB 12.0 03/18/2023   HCT 39.3 03/18/2023   PLT 328 03/18/2023   GLUCOSE 141 (H) 03/18/2023   CHOL 193 02/11/2022   TRIG 134.0 02/11/2022   HDL 82.60 02/11/2022  LDLDIRECT 87.0 10/17/2020   LDLCALC 84 02/11/2022   ALT 23 03/17/2023   AST 41 03/17/2023   NA 134 (L) 03/18/2023   K 4.2 03/18/2023   CL 93 (L) 03/18/2023   CREATININE 0.80 03/18/2023   BUN 17 03/18/2023   CO2 31 03/18/2023   TSH 2.14 02/02/2023   INR 1.0 03/16/2023   HGBA1C 4.5 (L) 02/02/2023    MR Abdomen W Wo Contrast  Result Date: 03/24/2023 CLINICAL DATA:  Elevated liver function tests and alkaline phosphatase level. Morbid obesity. EXAM: MRI ABDOMEN WITHOUT AND WITH CONTRAST TECHNIQUE: Multiplanar multisequence MR imaging of the abdomen was performed both before and after the administration of intravenous contrast. CONTRAST:  10mL GADAVIST GADOBUTROL 1 MMOL/ML IV SOLN COMPARISON:  08/30/2022 FINDINGS: Lower chest: No acute findings. Hepatobiliary: Tiny sub-centimeter benign hemangiomas are seen in the anterior and posterior right hepatic lobe. Significant decrease in hepatic steatosis since prior study. Gallstones are seen, however there is no evidence of cholecystitis or biliary dilatation. Pancreas:  No mass or inflammatory changes. Spleen:  Within normal limits in size and appearance. Adrenals/Urinary Tract: No suspicious masses identified. No evidence of hydronephrosis. Stomach/Bowel: Unremarkable. Vascular/Lymphatic: No pathologically enlarged lymph nodes identified. No acute vascular findings. Other:  None. Musculoskeletal:  No suspicious bone lesions identified. IMPRESSION: Significant decrease in hepatic steatosis since prior study. Tiny benign hepatic hemangiomas. Cholelithiasis. No radiographic evidence of cholecystitis or  biliary dilatation. Electronically Signed   By: Danae Orleans M.D.   On: 03/24/2023 12:40   DG Foot Complete Left  Result Date: 03/16/2023 CLINICAL DATA:  Bilateral foot swelling EXAM: LEFT FOOT - COMPLETE 3+ VIEW COMPARISON:  None Available. FINDINGS: There is no evidence of fracture or dislocation. There is no evidence of arthropathy or other focal bone abnormality. S soft tissue swelling without emphysema. IMPRESSION: No acute osseous abnormality Electronically Signed   By: Jasmine Pang M.D.   On: 03/16/2023 15:27   DG Foot Complete Right  Result Date: 03/16/2023 CLINICAL DATA:  Bilateral foot swelling for 1 month neuropathy open blisters EXAM: RIGHT FOOT COMPLETE - 3+ VIEW COMPARISON:  None Available. FINDINGS: No fracture or malalignment. No periostitis or osseous destructive change. Small plantar calcaneal spur IMPRESSION: No acute osseous abnormality. Electronically Signed   By: Jasmine Pang M.D.   On: 03/16/2023 15:20    Assessment & Plan:   Vasculitis (HCC)- Her sed rate has improved.  She will continue the steroids.  Will check cryoglobulins. -     Cryoglobulin Screen w/ Rflx; Future -     HYDROmorphone HCl; Take 1 tablet (2 mg total) by mouth every 6 (six) hours as needed for severe pain.  Dispense: 100 tablet; Refill: 0 -     Sedimentation rate; Future  Thrombocytopenia (HCC) - PLTs have normalized. -     Cryoglobulin Screen w/ Rflx; Future  Acute hepatitis A -     Cryoglobulin Screen w/ Rflx; Future -     Hepatitis A antibody, total; Future -     Hepatic function panel; Future  Primary hypertension - Her BP is well controlled. -     Basic metabolic panel; Future  Neuropathy involving both lower extremities -     HYDROmorphone HCl; Take 1 tablet (2 mg total) by mouth every 6 (six) hours as needed for severe pain.  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  Dispense: 100 tablet; Refill: 0  Cervical cancer screening -     Ambulatory referral to Gynecology        Follow-up: Return in about 3 months (around 06/29/2023).  Sanda Linger, MD

## 2023-03-30 NOTE — Telephone Encounter (Signed)
Spoke with patient made her aware of her low potassium, she understood to stop dyazide and torsemide and to start spironolactone. Advise to give [name] a call back if she had any questions.

## 2023-03-31 NOTE — Progress Notes (Signed)
of 18 Discharge Instruction: Apply directly to wound bed as directed Santyl Ointment Discharge Instruction: Apply nickel thick amount to wound bed as instructed Secondary Dressing ABD Pad, 8x10 Discharge Instruction: Apply over primary dressing as directed. Woven Gauze Sponge, Non-Sterile 4x4 in Discharge Instruction: Apply over primary dressing as directed. Secured With Compression Wrap Kerlix Roll 4.5x3.1 (in/yd) Discharge Instruction: Apply Kerlix and Coban compression as directed. Coban Self-Adherent Wrap 4x5 (in/yd) Discharge Instruction: Apply over Kerlix as directed. Compression Stockings Add-Ons Electronic Signature(s) Signed: 03/31/2023 4:28:55 PM By: Thayer Dallas Entered By: Thayer Dallas on 03/30/2023 14:22:15 -------------------------------------------------------------------------------- Wound Assessment Details Patient Name: Date of Service: [name], [name] 03/30/2023 1:15 PM Medical Record Number: [id number] Patient Account Number: [id number] Date of Birth/Sex: Treating RN: [birth year] (37 y.o. F) Primary Care Gilbert Narain: Sanda Linger Other Clinician: Referring Kaan Tosh: Treating Anapaula Severt/Extender: Madelyn Flavors in Treatment: 1 Wound Status Wound Number: 3 Primary Etiology: T be determined o Wound Location: Left, Proximal, Dorsal Foot Wound Status: Open Wounding Event: Gradually Appeared Comorbid History:  Anemia, Sleep Apnea, Vasculitis, Neuropathy Date Acquired: 03/09/2023 Weeks Of Treatment: 1 Clustered Wound: Yes Photos Wound Measurements Length: (cm) 8 Width: (cm) 8 Depth: (cm) 0.1 Clustered Quantity: 9 Area: (cm) 50.265 Volume: (cm) 5.027 Mcmackin, Zariana R ([id number]) Wound Description Classification: Unclassifiable Wound Margin: Distinct, outline attached Exudate Amount: Medium Exudate Type: Serosanguineous Exudate Color: red, brown Foul Odor After Cleansing: No Slough/Fibrino Yes % Reduction in Area: -207.7% % Reduction in Volume: -53.9% Epithelialization: None Tunneling: No Undermining: No [phone].pdf Page 14 of 18 Wound Bed Granulation Amount: None Present (0%) Exposed Structure Necrotic Amount: Large (67-100%) Fascia Exposed: No Necrotic Quality: Eschar, Adherent Slough Fat Layer (Subcutaneous Tissue) Exposed: No Tendon Exposed: No Muscle Exposed: No Joint Exposed: No Bone Exposed: No Periwound Skin Texture Texture Color No Abnormalities Noted: No No Abnormalities Noted: No Callus: No Atrophie Blanche: No Crepitus: No Cyanosis: No Excoriation: No Ecchymosis: No Induration: No Erythema: No Rash: No Hemosiderin Staining: No Scarring: No Mottled: No Pallor: No Moisture Rubor: No No Abnormalities Noted: No Dry / Scaly: Yes Maceration: No Treatment Notes Wound #3 (Foot) Wound Laterality: Dorsal, Left, Proximal Cleanser Soap and Water Discharge Instruction: May shower and wash wound with dial antibacterial soap and water prior to dressing change. Vashe 5.8 (oz) Discharge Instruction: Cleanse the wound with Vashe prior to applying a clean dressing using gauze sponges, not tissue or cotton balls. Peri-Wound Care Triamcinolone 15 (g) Discharge Instruction: Use triamcinolone ****liberally*** Sween Lotion (Moisturizing lotion) Discharge Instruction: Apply moisturizing lotion as directed Topical Triamcinolone Discharge  Instruction: Apply Triamcinolone as directed liberallly Primary Dressing Hydrofera Blue Ready Transfer Foam, 2.5x2.5 (in/in) Discharge Instruction: Apply directly to wound bed as directed Santyl Ointment Discharge Instruction: Apply nickel thick amount to wound bed as instructed Secondary Dressing ABD Pad, 8x10 Discharge Instruction: Apply over primary dressing as directed. Woven Gauze Sponge, Non-Sterile 4x4 in Discharge Instruction: Apply over primary dressing as directed. Secured With Compression Wrap Kerlix Roll 4.5x3.1 (in/yd) Discharge Instruction: Apply Kerlix and Coban compression as directed. Coban Self-Adherent Wrap 4x5 (in/yd) Discharge Instruction: Apply over Kerlix as directed. Compression Stockings ARLOINE, WIDMARK R ([id number]) [id number] Page 15 of 18 Add-Ons Electronic Signature(s) Signed: 03/31/2023 4:28:55 PM By: Thayer Dallas Entered By: Thayer Dallas on 03/30/2023 14:22:44 -------------------------------------------------------------------------------- Wound Assessment Details Patient Name: Date of Service: [name], [name] 03/30/2023 1:15 PM Medical Record Number: [id number] Patient Account Number: [id number] Date of Birth/Sex: Treating RN: 06-18-1986 (37 y.o. F) Primary Care Brietta Manso: Yetta Barre,  KATHRYNNE, EKBLAD R ([id number]) [id number] Page 1 of 18 Visit Report for 03/30/2023 Arrival Information Details Patient Name: Date of Service: GA MMA [name] NDRA R. 03/30/2023 1:15 PM Medical Record Number: [id number] Patient Account Number: [id number] Date of Birth/Sex: Treating RN: 10/10/1985 (37 y.o. F) Primary Care Duval Macleod: Sanda Linger Other Clinician: Referring Rahma Meller: Treating Antoniette Peake/Extender: Madelyn Flavors in Treatment: 1 Visit Information History Since Last Visit Added or deleted any medications: No Patient Arrived: Ambulatory Any new allergies or adverse reactions: No Arrival Time: 13:32 Had a fall or experienced change in No Accompanied By: mom activities of daily living that may affect Transfer Assistance: None risk of falls: Patient Identification Verified: Yes Signs or symptoms of abuse/neglect since last visito No Secondary Verification Process Completed: Yes Hospitalized since last visit: No Patient Requires Transmission-Based Precautions: No Implantable device outside of the clinic excluding No Patient Has Alerts: No cellular tissue based products placed in the center since last visit: Has Dressing in Place as Prescribed: Yes Has Compression in Place as Prescribed: Yes Pain Present Now: No Electronic Signature(s) Signed: 03/31/2023 4:28:55 PM By: Thayer Dallas Entered By: Thayer Dallas on 03/30/2023 13:34:13 -------------------------------------------------------------------------------- Encounter Discharge Information Details Patient Name: Date of Service: [name], [name] 03/30/2023 1:15 PM Medical Record Number: [id number] Patient Account Number: [id number] Date of Birth/Sex: Treating RN: [birth year] (37 y.o. [name] Primary Care Vidhi Delellis: Sanda Linger Other Clinician: Referring Aileene Lanum: Treating Keeley Sussman/Extender: Madelyn Flavors in Treatment: 1 Encounter Discharge  Information Items Post Procedure Vitals Discharge Condition: Stable Temperature (F): 99 Ambulatory Status: Ambulatory Pulse (bpm): 91 Discharge Destination: Home Respiratory Rate (breaths/min): 20 Transportation: Private Auto Blood Pressure (mmHg): 112/75 Accompanied By: mother Schedule Follow-up Appointment: Yes Clinical Summary of Care: Electronic Signature(s) Signed: 03/30/2023 5:19:03 PM By: Shawn Stall RN, BSN Entered By: Shawn Stall on 03/30/2023 14:53:51 Raborn, [place] Dragon R ([id number]) [id number] Page 2 of 18 -------------------------------------------------------------------------------- Lower Extremity Assessment Details Patient Name: Date of Service: Charlean Sanfilippo NDRA R. 03/30/2023 1:15 PM Medical Record Number: [id number] Patient Account Number: [id number] Date of Birth/Sex: Treating RN: 1986/05/16 (37 y.o. F) Primary Care Deshara Rossi: Sanda Linger Other Clinician: Referring Natelie Ostrosky: Treating Alvy Alsop/Extender: Madelyn Flavors in Treatment: 1 Edema Assessment Assessed: Kyra Searles: No] Franne Forts: No] Edema: [Left: Yes] [Right: Yes] Calf Left: Right: Point of Measurement: 39 cm From Medial Instep 51 cm 48.6 cm Ankle Left: Right: Point of Measurement: 9 cm From Medial Instep 26.5 cm 25.5 cm Vascular Assessment Extremity colors, hair growth, and conditions: Extremity Color: [Left:Hyperpigmented] [Right:Hyperpigmented] Hair Growth on Extremity: [Left:No] [Right:No] Temperature of Extremity: [Left:Warm] [Right:Warm] Capillary Refill: [Left:< 3 seconds] [Right:< 3 seconds] Dependent Rubor: [Left:No No] [Right:No No] Electronic Signature(s) Signed: 03/31/2023 4:28:55 PM By: Thayer Dallas Entered By: Thayer Dallas on 03/30/2023 14:19:45 -------------------------------------------------------------------------------- Multi Wound Chart Details Patient Name: Date of Service: [name], [name] 03/30/2023 1:15 PM Medical  Record Number: [id number] Patient Account Number: [id number] Date of Birth/Sex: Treating RN: 1986-05-09 (37 y.o. F) Primary Care Deshauna Cayson: Sanda Linger Other Clinician: Referring Keilin Gamboa: Treating Georgene Kopper/Extender: Madelyn Flavors in Treatment: 1 Vital Signs Height(in): 69 Pulse(bpm): 91 Weight(lbs): 272 Blood Pressure(mmHg): 112/75 Body Mass Index(BMI): 40.2 Temperature(F): 99 Respiratory Rate(breaths/min): 18 [1:Photos:] [[id number] Page 3 of 18] Left, Medial Foot Left, Distal Foot Left, Proximal, Dorsal Foot Wound Location: Gradually Appeared Gradually Appeared Gradually Appeared Wounding Event: T be determined o T be determined o T be determined o Primary Etiology: Anemia, Sleep Apnea, Vasculitis, Anemia,  Maisie Fus Other Clinician: Referring Shakima Nisley: Treating Shauntavia Brackin/Extender: Madelyn Flavors in Treatment: 1 Wound Status Wound Number: 4 Primary Etiology: T be determined o Wound Location: Right, Dorsal Foot Wound Status: Open Wounding Event: Gradually Appeared Comorbid History: Anemia, Sleep Apnea, Vasculitis, Neuropathy Date Acquired: 03/09/2023 Weeks Of Treatment: 1 Clustered Wound: Yes Photos Wound Measurements Length: (cm) 5.5 Width: (cm) 8 Depth: (cm) 0.1 Clustered Quantity: 4 Area: (cm) 34.558 Volume: (cm) 3.456 % Reduction in Area: 8.3% % Reduction in Volume: 8.3% Epithelialization: None Tunneling:  No Undermining: No Wound Description Classification: Full Thickness Without Exposed Supp Wound Margin: Distinct, outline attached Exudate Amount: Medium Exudate Type: Serosanguineous Exudate Color: red, brown ort Structures Foul Odor After Cleansing: No Slough/Fibrino Yes Wound Bed Granulation Amount: Medium (34-66%) Exposed Structure Granulation Quality: Red, Pink Fascia Exposed: No Necrotic Amount: Medium (34-66%) Fat Layer (Subcutaneous Tissue) Exposed: No Necrotic Quality: Eschar, Adherent Slough Tendon Exposed: No Muscle Exposed: No Joint Exposed: No Bone Exposed: No Periwound Skin Texture Texture Color No Abnormalities Noted: No No Abnormalities Noted: No Callus: No Atrophie BlancheBRYSA, PREZIOSI R ([id number]) [phone].pdf Page 16 of 18 Crepitus: No Cyanosis: No Excoriation: No Ecchymosis: No Induration: No Erythema: No Rash: No Hemosiderin Staining: No Scarring: No Mottled: No Pallor: No Moisture Rubor: No No Abnormalities Noted: No Dry / Scaly: Yes Maceration: No Treatment Notes Wound #4 (Foot) Wound Laterality: Dorsal, Right Cleanser Soap and Water Discharge Instruction: May shower and wash wound with dial antibacterial soap and water prior to dressing change. Vashe 5.8 (oz) Discharge Instruction: Cleanse the wound with Vashe prior to applying a clean dressing using gauze sponges, not tissue or cotton balls. Peri-Wound Care Triamcinolone 15 (g) Discharge Instruction: Use triamcinolone ****liberally*** Sween Lotion (Moisturizing lotion) Discharge Instruction: Apply moisturizing lotion as directed Topical Triamcinolone Discharge Instruction: Apply Triamcinolone as directed liberallly Primary Dressing Hydrofera Blue Ready Transfer Foam, 2.5x2.5 (in/in) Discharge Instruction: Apply directly to wound bed as directed Santyl Ointment Discharge Instruction: Apply nickel thick amount to wound bed as instructed Secondary  Dressing ABD Pad, 8x10 Discharge Instruction: Apply over primary dressing as directed. Woven Gauze Sponge, Non-Sterile 4x4 in Discharge Instruction: Apply over primary dressing as directed. Secured With Compression Wrap Kerlix Roll 4.5x3.1 (in/yd) Discharge Instruction: Apply Kerlix and Coban compression as directed. Coban Self-Adherent Wrap 4x5 (in/yd) Discharge Instruction: Apply over Kerlix as directed. Compression Stockings Add-Ons Electronic Signature(s) Signed: 03/31/2023 4:28:55 PM By: Thayer Dallas Entered By: Thayer Dallas on 03/30/2023 14:23:29 -------------------------------------------------------------------------------- Wound Assessment Details Patient Name: Date of Service: [name], [name] 03/30/2023 1:15 PM Medical Record Number: [id number] Patient Account Number: [id number] Date of Birth/Sex: Treating RN: 1986/03/02 (37 y.o. F) Primary Care Aulton Routt: Sanda Linger Other Clinician: Referring Oreoluwa Aigner: Treating Velvie Thomaston/Extender: Saniaa, Hoschar, [place] R ([id number]) [phone].pdf Page 17 of 18 Weeks in Treatment: 1 Wound Status Wound Number: 5 Primary Etiology: T be determined o Wound Location: Right, Lateral Calcaneus Wound Status: Open Wounding Event: Gradually Appeared Comorbid History: Anemia, Sleep Apnea, Vasculitis, Neuropathy Date Acquired: 03/09/2023 Weeks Of Treatment: 1 Clustered Wound: No Photos Wound Measurements Length: (cm) 2 Width: (cm) 2.5 Depth: (cm) 0.1 Area: (cm) 3.927 Volume: (cm) 0.393 % Reduction in Area: 33.3% % Reduction in Volume: 33.3% Epithelialization: Small (1-33%) Tunneling: No Undermining: No Wound Description Classification: Full Thickness Without Exposed Support Structures Wound Margin: Distinct, outline attached Exudate Amount: Medium Exudate Type: Serosanguineous Exudate Color: red, brown Foul Odor After Cleansing: No Slough/Fibrino No Wound Bed Granulation  Amount: Medium (34-66%) Exposed Structure Granulation  KATHRYNNE, EKBLAD R ([id number]) [id number] Page 1 of 18 Visit Report for 03/30/2023 Arrival Information Details Patient Name: Date of Service: GA MMA [name] NDRA R. 03/30/2023 1:15 PM Medical Record Number: [id number] Patient Account Number: [id number] Date of Birth/Sex: Treating RN: 10/10/1985 (37 y.o. F) Primary Care Duval Macleod: Sanda Linger Other Clinician: Referring Rahma Meller: Treating Antoniette Peake/Extender: Madelyn Flavors in Treatment: 1 Visit Information History Since Last Visit Added or deleted any medications: No Patient Arrived: Ambulatory Any new allergies or adverse reactions: No Arrival Time: 13:32 Had a fall or experienced change in No Accompanied By: mom activities of daily living that may affect Transfer Assistance: None risk of falls: Patient Identification Verified: Yes Signs or symptoms of abuse/neglect since last visito No Secondary Verification Process Completed: Yes Hospitalized since last visit: No Patient Requires Transmission-Based Precautions: No Implantable device outside of the clinic excluding No Patient Has Alerts: No cellular tissue based products placed in the center since last visit: Has Dressing in Place as Prescribed: Yes Has Compression in Place as Prescribed: Yes Pain Present Now: No Electronic Signature(s) Signed: 03/31/2023 4:28:55 PM By: Thayer Dallas Entered By: Thayer Dallas on 03/30/2023 13:34:13 -------------------------------------------------------------------------------- Encounter Discharge Information Details Patient Name: Date of Service: [name], [name] 03/30/2023 1:15 PM Medical Record Number: [id number] Patient Account Number: [id number] Date of Birth/Sex: Treating RN: [birth year] (37 y.o. [name] Primary Care Vidhi Delellis: Sanda Linger Other Clinician: Referring Aileene Lanum: Treating Keeley Sussman/Extender: Madelyn Flavors in Treatment: 1 Encounter Discharge  Information Items Post Procedure Vitals Discharge Condition: Stable Temperature (F): 99 Ambulatory Status: Ambulatory Pulse (bpm): 91 Discharge Destination: Home Respiratory Rate (breaths/min): 20 Transportation: Private Auto Blood Pressure (mmHg): 112/75 Accompanied By: mother Schedule Follow-up Appointment: Yes Clinical Summary of Care: Electronic Signature(s) Signed: 03/30/2023 5:19:03 PM By: Shawn Stall RN, BSN Entered By: Shawn Stall on 03/30/2023 14:53:51 Raborn, [place] Dragon R ([id number]) [id number] Page 2 of 18 -------------------------------------------------------------------------------- Lower Extremity Assessment Details Patient Name: Date of Service: Charlean Sanfilippo NDRA R. 03/30/2023 1:15 PM Medical Record Number: [id number] Patient Account Number: [id number] Date of Birth/Sex: Treating RN: 1986/05/16 (37 y.o. F) Primary Care Deshara Rossi: Sanda Linger Other Clinician: Referring Natelie Ostrosky: Treating Alvy Alsop/Extender: Madelyn Flavors in Treatment: 1 Edema Assessment Assessed: Kyra Searles: No] Franne Forts: No] Edema: [Left: Yes] [Right: Yes] Calf Left: Right: Point of Measurement: 39 cm From Medial Instep 51 cm 48.6 cm Ankle Left: Right: Point of Measurement: 9 cm From Medial Instep 26.5 cm 25.5 cm Vascular Assessment Extremity colors, hair growth, and conditions: Extremity Color: [Left:Hyperpigmented] [Right:Hyperpigmented] Hair Growth on Extremity: [Left:No] [Right:No] Temperature of Extremity: [Left:Warm] [Right:Warm] Capillary Refill: [Left:< 3 seconds] [Right:< 3 seconds] Dependent Rubor: [Left:No No] [Right:No No] Electronic Signature(s) Signed: 03/31/2023 4:28:55 PM By: Thayer Dallas Entered By: Thayer Dallas on 03/30/2023 14:19:45 -------------------------------------------------------------------------------- Multi Wound Chart Details Patient Name: Date of Service: [name], [name] 03/30/2023 1:15 PM Medical  Record Number: [id number] Patient Account Number: [id number] Date of Birth/Sex: Treating RN: 1986-05-09 (37 y.o. F) Primary Care Deshauna Cayson: Sanda Linger Other Clinician: Referring Keilin Gamboa: Treating Georgene Kopper/Extender: Madelyn Flavors in Treatment: 1 Vital Signs Height(in): 69 Pulse(bpm): 91 Weight(lbs): 272 Blood Pressure(mmHg): 112/75 Body Mass Index(BMI): 40.2 Temperature(F): 99 Respiratory Rate(breaths/min): 18 [1:Photos:] [[id number] Page 3 of 18] Left, Medial Foot Left, Distal Foot Left, Proximal, Dorsal Foot Wound Location: Gradually Appeared Gradually Appeared Gradually Appeared Wounding Event: T be determined o T be determined o T be determined o Primary Etiology: Anemia, Sleep Apnea, Vasculitis, Anemia,  Apply directly to wound bed as directed Santyl Ointment Discharge Instruction: Apply nickel thick amount to wound bed as instructed Secondary Dressing ABD Pad, 8x10 Discharge Instruction: Apply over primary dressing as directed. Woven Gauze Sponge, Non-Sterile 4x4 in Discharge Instruction: Apply over primary dressing as directed. Secured With Compression Wrap Kerlix Roll 4.5x3.1 (in/yd) Discharge Instruction: Apply Kerlix and Coban compression as directed. Coban Self-Adherent Wrap 4x5 (in/yd) Discharge Instruction: Apply over Kerlix as directed. Compression Stockings Add-Ons Wound #4 (Foot) Wound Laterality: Dorsal, Right Cleanser Soap and Water Discharge Instruction: May shower and wash wound with dial antibacterial soap and water prior to dressing change. Vashe 5.8 (oz) Discharge Instruction: Cleanse the wound with Vashe prior to applying a clean dressing using gauze sponges, not tissue or cotton balls. Peri-Wound Care Triamcinolone 15 (g) Discharge Instruction: Use triamcinolone ****liberally*** Sween Lotion (Moisturizing lotion) Discharge Instruction: Apply moisturizing lotion as directed Topical Triamcinolone Discharge Instruction: Apply Triamcinolone as directed liberallly Primary Dressing Hydrofera Blue Ready Transfer Foam, 2.5x2.5 (in/in) Discharge Instruction: Apply directly to wound bed as directed Santyl Ointment Discharge Instruction: Apply nickel thick amount to wound bed as instructed Secondary Dressing ABD Pad, 8x10 Discharge Instruction: Apply over primary dressing as directed. Woven Gauze Sponge, Non-Sterile 4x4 in Discharge Instruction: Apply over primary dressing as directed. Secured With Compression Wrap Kerlix Roll 4.5x3.1 (in/yd) Discharge Instruction: Apply Kerlix and Coban compression as directed. Coban Self-Adherent Wrap 4x5 (in/yd) Discharge Instruction: Apply over Kerlix as directed. Compression Stockings Add-Ons Wound #5  (Calcaneus) Wound Laterality: Right, Lateral Cleanser Soap and Water Discharge Instruction: May shower and wash wound with dial antibacterial soap and water prior to dressing change. NARCISSUS, MACDERMOTT R ([id number]) [id number] Page 7 of 18 Vashe 5.8 (oz) Discharge Instruction: Cleanse the wound with Vashe prior to applying a clean dressing using gauze sponges, not tissue or cotton balls. Peri-Wound Care Triamcinolone 15 (g) Discharge Instruction: Use triamcinolone ****liberally*** Sween Lotion (Moisturizing lotion) Discharge Instruction: Apply moisturizing lotion as directed Topical Triamcinolone Discharge Instruction: Apply Triamcinolone as directed liberallly Primary Dressing Hydrofera Blue Ready Transfer Foam, 2.5x2.5 (in/in) Discharge Instruction: Apply directly to wound bed as directed Santyl Ointment Discharge Instruction: Apply nickel thick amount to wound bed as instructed Secondary Dressing ABD Pad, 8x10 Discharge Instruction: Apply over primary dressing as directed. Woven Gauze Sponge, Non-Sterile 4x4 in Discharge Instruction: Apply over primary dressing as directed. Secured With Compression Wrap Kerlix Roll 4.5x3.1 (in/yd) Discharge Instruction: Apply Kerlix and Coban compression as directed. Coban Self-Adherent Wrap 4x5 (in/yd) Discharge Instruction: Apply over Kerlix as directed. Compression Stockings Add-Ons Electronic Signature(s) Signed: 03/30/2023 4:52:49 PM By: Baltazar Najjar MD Entered By: Baltazar Najjar on 03/30/2023 15:17:26 -------------------------------------------------------------------------------- Multi-Disciplinary Care Plan Details Patient Name: Date of Service: [name], [name] 03/30/2023 1:15 PM Medical Record Number: [id number] Patient Account Number: [id number] Date of Birth/Sex: Treating RN: 1986-02-27 (37 y.o. [name] Primary Care Traquan Duarte: Sanda Linger Other Clinician: Referring Verland Sprinkle: Treating  Ciella Obi/Extender: Madelyn Flavors in Treatment: 1 Active Inactive Nutrition Nursing Diagnoses: Potential for alteratiion in Nutrition/Potential for imbalanced nutrition Goals: Patient/caregiver agrees to and verbalizes understanding of need to use nutritional supplements and/or vitamins as prescribed Date Initiated: 03/23/2023 Target Resolution Date: 04/09/2023 Goal Status: Active Interventions: ANNELLE, REHA ([id number]) [phone].pdf Page 8 of 18 Assess patient nutrition upon admission and as needed per policy Provide education on nutrition Treatment Activities: Patient referred to Primary Care Physician for further nutritional evaluation : 03/23/2023 Notes: Pain, Acute or Chronic Nursing Diagnoses: Pain, acute or chronic: actual or potential  Maisie Fus Other Clinician: Referring Shakima Nisley: Treating Shauntavia Brackin/Extender: Madelyn Flavors in Treatment: 1 Wound Status Wound Number: 4 Primary Etiology: T be determined o Wound Location: Right, Dorsal Foot Wound Status: Open Wounding Event: Gradually Appeared Comorbid History: Anemia, Sleep Apnea, Vasculitis, Neuropathy Date Acquired: 03/09/2023 Weeks Of Treatment: 1 Clustered Wound: Yes Photos Wound Measurements Length: (cm) 5.5 Width: (cm) 8 Depth: (cm) 0.1 Clustered Quantity: 4 Area: (cm) 34.558 Volume: (cm) 3.456 % Reduction in Area: 8.3% % Reduction in Volume: 8.3% Epithelialization: None Tunneling:  No Undermining: No Wound Description Classification: Full Thickness Without Exposed Supp Wound Margin: Distinct, outline attached Exudate Amount: Medium Exudate Type: Serosanguineous Exudate Color: red, brown ort Structures Foul Odor After Cleansing: No Slough/Fibrino Yes Wound Bed Granulation Amount: Medium (34-66%) Exposed Structure Granulation Quality: Red, Pink Fascia Exposed: No Necrotic Amount: Medium (34-66%) Fat Layer (Subcutaneous Tissue) Exposed: No Necrotic Quality: Eschar, Adherent Slough Tendon Exposed: No Muscle Exposed: No Joint Exposed: No Bone Exposed: No Periwound Skin Texture Texture Color No Abnormalities Noted: No No Abnormalities Noted: No Callus: No Atrophie BlancheBRYSA, PREZIOSI R ([id number]) [phone].pdf Page 16 of 18 Crepitus: No Cyanosis: No Excoriation: No Ecchymosis: No Induration: No Erythema: No Rash: No Hemosiderin Staining: No Scarring: No Mottled: No Pallor: No Moisture Rubor: No No Abnormalities Noted: No Dry / Scaly: Yes Maceration: No Treatment Notes Wound #4 (Foot) Wound Laterality: Dorsal, Right Cleanser Soap and Water Discharge Instruction: May shower and wash wound with dial antibacterial soap and water prior to dressing change. Vashe 5.8 (oz) Discharge Instruction: Cleanse the wound with Vashe prior to applying a clean dressing using gauze sponges, not tissue or cotton balls. Peri-Wound Care Triamcinolone 15 (g) Discharge Instruction: Use triamcinolone ****liberally*** Sween Lotion (Moisturizing lotion) Discharge Instruction: Apply moisturizing lotion as directed Topical Triamcinolone Discharge Instruction: Apply Triamcinolone as directed liberallly Primary Dressing Hydrofera Blue Ready Transfer Foam, 2.5x2.5 (in/in) Discharge Instruction: Apply directly to wound bed as directed Santyl Ointment Discharge Instruction: Apply nickel thick amount to wound bed as instructed Secondary  Dressing ABD Pad, 8x10 Discharge Instruction: Apply over primary dressing as directed. Woven Gauze Sponge, Non-Sterile 4x4 in Discharge Instruction: Apply over primary dressing as directed. Secured With Compression Wrap Kerlix Roll 4.5x3.1 (in/yd) Discharge Instruction: Apply Kerlix and Coban compression as directed. Coban Self-Adherent Wrap 4x5 (in/yd) Discharge Instruction: Apply over Kerlix as directed. Compression Stockings Add-Ons Electronic Signature(s) Signed: 03/31/2023 4:28:55 PM By: Thayer Dallas Entered By: Thayer Dallas on 03/30/2023 14:23:29 -------------------------------------------------------------------------------- Wound Assessment Details Patient Name: Date of Service: [name], [name] 03/30/2023 1:15 PM Medical Record Number: [id number] Patient Account Number: [id number] Date of Birth/Sex: Treating RN: 1986/03/02 (37 y.o. F) Primary Care Aulton Routt: Sanda Linger Other Clinician: Referring Oreoluwa Aigner: Treating Velvie Thomaston/Extender: [place], Hoschar, [place] R ([id number]) [phone].pdf Page 17 of 18 Weeks in Treatment: 1 Wound Status Wound Number: 5 Primary Etiology: T be determined o Wound Location: Right, Lateral Calcaneus Wound Status: Open Wounding Event: Gradually Appeared Comorbid History: Anemia, Sleep Apnea, Vasculitis, Neuropathy Date Acquired: 03/09/2023 Weeks Of Treatment: 1 Clustered Wound: No Photos Wound Measurements Length: (cm) 2 Width: (cm) 2.5 Depth: (cm) 0.1 Area: (cm) 3.927 Volume: (cm) 0.393 % Reduction in Area: 33.3% % Reduction in Volume: 33.3% Epithelialization: Small (1-33%) Tunneling: No Undermining: No Wound Description Classification: Full Thickness Without Exposed Support Structures Wound Margin: Distinct, outline attached Exudate Amount: Medium Exudate Type: Serosanguineous Exudate Color: red, brown Foul Odor After Cleansing: No Slough/Fibrino No Wound Bed Granulation  Amount: Medium (34-66%) Exposed Structure Granulation  Maisie Fus Other Clinician: Referring Shakima Nisley: Treating Shauntavia Brackin/Extender: Madelyn Flavors in Treatment: 1 Wound Status Wound Number: 4 Primary Etiology: T be determined o Wound Location: Right, Dorsal Foot Wound Status: Open Wounding Event: Gradually Appeared Comorbid History: Anemia, Sleep Apnea, Vasculitis, Neuropathy Date Acquired: 03/09/2023 Weeks Of Treatment: 1 Clustered Wound: Yes Photos Wound Measurements Length: (cm) 5.5 Width: (cm) 8 Depth: (cm) 0.1 Clustered Quantity: 4 Area: (cm) 34.558 Volume: (cm) 3.456 % Reduction in Area: 8.3% % Reduction in Volume: 8.3% Epithelialization: None Tunneling:  No Undermining: No Wound Description Classification: Full Thickness Without Exposed Supp Wound Margin: Distinct, outline attached Exudate Amount: Medium Exudate Type: Serosanguineous Exudate Color: red, brown ort Structures Foul Odor After Cleansing: No Slough/Fibrino Yes Wound Bed Granulation Amount: Medium (34-66%) Exposed Structure Granulation Quality: Red, Pink Fascia Exposed: No Necrotic Amount: Medium (34-66%) Fat Layer (Subcutaneous Tissue) Exposed: No Necrotic Quality: Eschar, Adherent Slough Tendon Exposed: No Muscle Exposed: No Joint Exposed: No Bone Exposed: No Periwound Skin Texture Texture Color No Abnormalities Noted: No No Abnormalities Noted: No Callus: No Atrophie BlancheBRYSA, PREZIOSI R ([id number]) [phone].pdf Page 16 of 18 Crepitus: No Cyanosis: No Excoriation: No Ecchymosis: No Induration: No Erythema: No Rash: No Hemosiderin Staining: No Scarring: No Mottled: No Pallor: No Moisture Rubor: No No Abnormalities Noted: No Dry / Scaly: Yes Maceration: No Treatment Notes Wound #4 (Foot) Wound Laterality: Dorsal, Right Cleanser Soap and Water Discharge Instruction: May shower and wash wound with dial antibacterial soap and water prior to dressing change. Vashe 5.8 (oz) Discharge Instruction: Cleanse the wound with Vashe prior to applying a clean dressing using gauze sponges, not tissue or cotton balls. Peri-Wound Care Triamcinolone 15 (g) Discharge Instruction: Use triamcinolone ****liberally*** Sween Lotion (Moisturizing lotion) Discharge Instruction: Apply moisturizing lotion as directed Topical Triamcinolone Discharge Instruction: Apply Triamcinolone as directed liberallly Primary Dressing Hydrofera Blue Ready Transfer Foam, 2.5x2.5 (in/in) Discharge Instruction: Apply directly to wound bed as directed Santyl Ointment Discharge Instruction: Apply nickel thick amount to wound bed as instructed Secondary  Dressing ABD Pad, 8x10 Discharge Instruction: Apply over primary dressing as directed. Woven Gauze Sponge, Non-Sterile 4x4 in Discharge Instruction: Apply over primary dressing as directed. Secured With Compression Wrap Kerlix Roll 4.5x3.1 (in/yd) Discharge Instruction: Apply Kerlix and Coban compression as directed. Coban Self-Adherent Wrap 4x5 (in/yd) Discharge Instruction: Apply over Kerlix as directed. Compression Stockings Add-Ons Electronic Signature(s) Signed: 03/31/2023 4:28:55 PM By: Thayer Dallas Entered By: Thayer Dallas on 03/30/2023 14:23:29 -------------------------------------------------------------------------------- Wound Assessment Details Patient Name: Date of Service: [name], [name] 03/30/2023 1:15 PM Medical Record Number: [id number] Patient Account Number: [id number] Date of Birth/Sex: Treating RN: 1986/03/02 (37 y.o. F) Primary Care Aulton Routt: Sanda Linger Other Clinician: Referring Oreoluwa Aigner: Treating Velvie Thomaston/Extender: [place]ia[place], Hoschar, [place] R ([id number]) [phone].pdf Page 17 of 18 Weeks in Treatment: 1 Wound Status Wound Number: 5 Primary Etiology: T be determined o Wound Location: Right, Lateral Calcaneus Wound Status: Open Wounding Event: Gradually Appeared Comorbid History: Anemia, Sleep Apnea, Vasculitis, Neuropathy Date Acquired: 03/09/2023 Weeks Of Treatment: 1 Clustered Wound: No Photos Wound Measurements Length: (cm) 2 Width: (cm) 2.5 Depth: (cm) 0.1 Area: (cm) 3.927 Volume: (cm) 0.393 % Reduction in Area: 33.3% % Reduction in Volume: 33.3% Epithelialization: Small (1-33%) Tunneling: No Undermining: No Wound Description Classification: Full Thickness Without Exposed Support Structures Wound Margin: Distinct, outline attached Exudate Amount: Medium Exudate Type: Serosanguineous Exudate Color: red, brown Foul Odor After Cleansing: No Slough/Fibrino No Wound Bed Granulation  Amount: Medium (34-66%) Exposed Structure Granulation  Maisie Fus Other Clinician: Referring Shakima Nisley: Treating Shauntavia Brackin/Extender: Madelyn Flavors in Treatment: 1 Wound Status Wound Number: 4 Primary Etiology: T be determined o Wound Location: Right, Dorsal Foot Wound Status: Open Wounding Event: Gradually Appeared Comorbid History: Anemia, Sleep Apnea, Vasculitis, Neuropathy Date Acquired: 03/09/2023 Weeks Of Treatment: 1 Clustered Wound: Yes Photos Wound Measurements Length: (cm) 5.5 Width: (cm) 8 Depth: (cm) 0.1 Clustered Quantity: 4 Area: (cm) 34.558 Volume: (cm) 3.456 % Reduction in Area: 8.3% % Reduction in Volume: 8.3% Epithelialization: None Tunneling:  No Undermining: No Wound Description Classification: Full Thickness Without Exposed Supp Wound Margin: Distinct, outline attached Exudate Amount: Medium Exudate Type: Serosanguineous Exudate Color: red, brown ort Structures Foul Odor After Cleansing: No Slough/Fibrino Yes Wound Bed Granulation Amount: Medium (34-66%) Exposed Structure Granulation Quality: Red, Pink Fascia Exposed: No Necrotic Amount: Medium (34-66%) Fat Layer (Subcutaneous Tissue) Exposed: No Necrotic Quality: Eschar, Adherent Slough Tendon Exposed: No Muscle Exposed: No Joint Exposed: No Bone Exposed: No Periwound Skin Texture Texture Color No Abnormalities Noted: No No Abnormalities Noted: No Callus: No Atrophie BlancheBRYSA, PREZIOSI R ([id number]) [phone].pdf Page 16 of 18 Crepitus: No Cyanosis: No Excoriation: No Ecchymosis: No Induration: No Erythema: No Rash: No Hemosiderin Staining: No Scarring: No Mottled: No Pallor: No Moisture Rubor: No No Abnormalities Noted: No Dry / Scaly: Yes Maceration: No Treatment Notes Wound #4 (Foot) Wound Laterality: Dorsal, Right Cleanser Soap and Water Discharge Instruction: May shower and wash wound with dial antibacterial soap and water prior to dressing change. Vashe 5.8 (oz) Discharge Instruction: Cleanse the wound with Vashe prior to applying a clean dressing using gauze sponges, not tissue or cotton balls. Peri-Wound Care Triamcinolone 15 (g) Discharge Instruction: Use triamcinolone ****liberally*** Sween Lotion (Moisturizing lotion) Discharge Instruction: Apply moisturizing lotion as directed Topical Triamcinolone Discharge Instruction: Apply Triamcinolone as directed liberallly Primary Dressing Hydrofera Blue Ready Transfer Foam, 2.5x2.5 (in/in) Discharge Instruction: Apply directly to wound bed as directed Santyl Ointment Discharge Instruction: Apply nickel thick amount to wound bed as instructed Secondary  Dressing ABD Pad, 8x10 Discharge Instruction: Apply over primary dressing as directed. Woven Gauze Sponge, Non-Sterile 4x4 in Discharge Instruction: Apply over primary dressing as directed. Secured With Compression Wrap Kerlix Roll 4.5x3.1 (in/yd) Discharge Instruction: Apply Kerlix and Coban compression as directed. Coban Self-Adherent Wrap 4x5 (in/yd) Discharge Instruction: Apply over Kerlix as directed. Compression Stockings Add-Ons Electronic Signature(s) Signed: 03/31/2023 4:28:55 PM By: Thayer Dallas Entered By: Thayer Dallas on 03/30/2023 14:23:29 -------------------------------------------------------------------------------- Wound Assessment Details Patient Name: Date of Service: [name], [name] 03/30/2023 1:15 PM Medical Record Number: [id number] Patient Account Number: [id number] Date of Birth/Sex: Treating RN: 1986/03/02 (37 y.o. F) Primary Care Aulton Routt: Sanda Linger Other Clinician: Referring Oreoluwa Aigner: Treating Velvie Thomaston/Extender: [place], Hoschar, [place] R ([id number]) [phone].pdf Page 17 of 18 Weeks in Treatment: 1 Wound Status Wound Number: 5 Primary Etiology: T be determined o Wound Location: Right, Lateral Calcaneus Wound Status: Open Wounding Event: Gradually Appeared Comorbid History: Anemia, Sleep Apnea, Vasculitis, Neuropathy Date Acquired: 03/09/2023 Weeks Of Treatment: 1 Clustered Wound: No Photos Wound Measurements Length: (cm) 2 Width: (cm) 2.5 Depth: (cm) 0.1 Area: (cm) 3.927 Volume: (cm) 0.393 % Reduction in Area: 33.3% % Reduction in Volume: 33.3% Epithelialization: Small (1-33%) Tunneling: No Undermining: No Wound Description Classification: Full Thickness Without Exposed Support Structures Wound Margin: Distinct, outline attached Exudate Amount: Medium Exudate Type: Serosanguineous Exudate Color: red, brown Foul Odor After Cleansing: No Slough/Fibrino No Wound Bed Granulation  Amount: Medium (34-66%) Exposed Structure Granulation  KATHRYNNE, EKBLAD R ([id number]) [id number] Page 1 of 18 Visit Report for 03/30/2023 Arrival Information Details Patient Name: Date of Service: GA MMA [name] NDRA R. 03/30/2023 1:15 PM Medical Record Number: [id number] Patient Account Number: [id number] Date of Birth/Sex: Treating RN: 10/10/1985 (37 y.o. F) Primary Care Duval Macleod: Sanda Linger Other Clinician: Referring Rahma Meller: Treating Antoniette Peake/Extender: Madelyn Flavors in Treatment: 1 Visit Information History Since Last Visit Added or deleted any medications: No Patient Arrived: Ambulatory Any new allergies or adverse reactions: No Arrival Time: 13:32 Had a fall or experienced change in No Accompanied By: mom activities of daily living that may affect Transfer Assistance: None risk of falls: Patient Identification Verified: Yes Signs or symptoms of abuse/neglect since last visito No Secondary Verification Process Completed: Yes Hospitalized since last visit: No Patient Requires Transmission-Based Precautions: No Implantable device outside of the clinic excluding No Patient Has Alerts: No cellular tissue based products placed in the center since last visit: Has Dressing in Place as Prescribed: Yes Has Compression in Place as Prescribed: Yes Pain Present Now: No Electronic Signature(s) Signed: 03/31/2023 4:28:55 PM By: Thayer Dallas Entered By: Thayer Dallas on 03/30/2023 13:34:13 -------------------------------------------------------------------------------- Encounter Discharge Information Details Patient Name: Date of Service: [name], [name] 03/30/2023 1:15 PM Medical Record Number: [id number] Patient Account Number: [id number] Date of Birth/Sex: Treating RN: [birth year] (37 y.o. [name] Primary Care Vidhi Delellis: Sanda Linger Other Clinician: Referring Aileene Lanum: Treating Keeley Sussman/Extender: Madelyn Flavors in Treatment: 1 Encounter Discharge  Information Items Post Procedure Vitals Discharge Condition: Stable Temperature (F): 99 Ambulatory Status: Ambulatory Pulse (bpm): 91 Discharge Destination: Home Respiratory Rate (breaths/min): 20 Transportation: Private Auto Blood Pressure (mmHg): 112/75 Accompanied By: mother Schedule Follow-up Appointment: Yes Clinical Summary of Care: Electronic Signature(s) Signed: 03/30/2023 5:19:03 PM By: Shawn Stall RN, BSN Entered By: Shawn Stall on 03/30/2023 14:53:51 [place], [place] Dragon R ([id number]) [id number].pdf Page 2 of 18 -------------------------------------------------------------------------------- Lower Extremity Assessment Details Patient Name: Date of Service: Charlean Sanfilippo NDRA R. 03/30/2023 1:15 PM Medical Record Number: [id number] Patient Account Number: [id number] Date of Birth/Sex: Treating RN: 1986/05/16 (37 y.o. F) Primary Care Deshara Rossi: Sanda Linger Other Clinician: Referring Natelie Ostrosky: Treating Alvy Alsop/Extender: Madelyn Flavors in Treatment: 1 Edema Assessment Assessed: Kyra Searles: No] Franne Forts: No] Edema: [Left: Yes] [Right: Yes] Calf Left: Right: Point of Measurement: 39 cm From Medial Instep 51 cm 48.6 cm Ankle Left: Right: Point of Measurement: 9 cm From Medial Instep 26.5 cm 25.5 cm Vascular Assessment Extremity colors, hair growth, and conditions: Extremity Color: [Left:Hyperpigmented] [Right:Hyperpigmented] Hair Growth on Extremity: [Left:No] [Right:No] Temperature of Extremity: [Left:Warm] [Right:Warm] Capillary Refill: [Left:< 3 seconds] [Right:< 3 seconds] Dependent Rubor: [Left:No No] [Right:No No] Electronic Signature(s) Signed: 03/31/2023 4:28:55 PM By: Thayer Dallas Entered By: Thayer Dallas on 03/30/2023 14:19:45 -------------------------------------------------------------------------------- Multi Wound Chart Details Patient Name: Date of Service: [name], [name] 03/30/2023 1:15 PM Medical  Record Number: [id number] Patient Account Number: [id number] Date of Birth/Sex: Treating RN: 1986-05-09 (37 y.o. F) Primary Care Deshauna Cayson: Sanda Linger Other Clinician: Referring Keilin Gamboa: Treating Georgene Kopper/Extender: Madelyn Flavors in Treatment: 1 Vital Signs Height(in): 69 Pulse(bpm): 91 Weight(lbs): 272 Blood Pressure(mmHg): 112/75 Body Mass Index(BMI): 40.2 Temperature(F): 99 Respiratory Rate(breaths/min): 18 [1:Photos:] [[id number] Page 3 of 18] Left, Medial Foot Left, Distal Foot Left, Proximal, Dorsal Foot Wound Location: Gradually Appeared Gradually Appeared Gradually Appeared Wounding Event: T be determined o T be determined o T be determined o Primary Etiology: Anemia, Sleep Apnea, Vasculitis, Anemia,

## 2023-03-31 NOTE — Progress Notes (Signed)
LONIA, SWEEZEY R ([id number]) [id number] Page 1 of 12 Visit Report for 03/30/2023 Debridement Details Patient Name: Date of Service: [name] NDRA R. 03/30/2023 1:15 PM Medical Record Number: [id number] Patient Account Number: [id number] Date of Birth/Sex: Treating RN: 03/27/1986 (37 y.o. [name], [name] Primary Care Provider: Sanda Linger Other Clinician: Referring Provider: Treating Provider/Extender: Madelyn Flavors in Treatment: 1 Debridement Performed for Assessment: Wound #1 Left,Medial Foot Performed By: Clinician Shawn Stall, RN Debridement Type: Chemical/Enzymatic/Mechanical Agent Used: Santyl Level of Consciousness (Pre-procedure): Awake and Alert Pre-procedure Verification/Time Out No Taken: Percent of Wound Bed Debrided: Bleeding: None Response to Treatment: Procedure was tolerated well Level of Consciousness (Post- Awake and Alert procedure): Post Debridement Measurements of Total Wound Length: (cm) 7 Width: (cm) 1 Depth: (cm) 0.1 Volume: (cm) 0.55 Character of Wound/Ulcer Post Debridement: Stable Post Procedure Diagnosis Same as Pre-procedure Electronic Signature(s) Signed: 03/30/2023 4:52:49 PM By: Baltazar Najjar MD Signed: 03/30/2023 5:19:03 PM By: Shawn Stall RN, BSN Entered By: Shawn Stall on 03/30/2023 14:52:02 -------------------------------------------------------------------------------- Debridement Details Patient Name: Date of Service: [name], [name] 03/30/2023 1:15 PM Medical Record Number: [id number] Patient Account Number: [id number] Date of Birth/Sex: Treating RN: 1986-06-17 (37 y.o. [name], [name] Primary Care Provider: Sanda Linger Other Clinician: Referring Provider: Treating Provider/Extender: Madelyn Flavors in Treatment: 1 Debridement Performed for Assessment: Wound #2 Left,Distal Foot Performed By: Clinician Shawn Stall, RN Debridement Type:  Chemical/Enzymatic/Mechanical Agent Used: Santyl Level of Consciousness (Pre-procedure): Awake and Alert Pre-procedure Verification/Time Out No Taken: Percent of Wound Bed Debrided: Bleeding: None Response to Treatment: Procedure was tolerated well Level of Consciousness (Post- Awake and Alert procedure): TEONDRA, LANGIN R ([id number]) [id number] Page 2 of 12 Post Debridement Measurements of Total Wound Length: (cm) 2.5 Width: (cm) 3 Depth: (cm) 0.1 Volume: (cm) 0.589 Character of Wound/Ulcer Post Debridement: Stable Post Procedure Diagnosis Same as Pre-procedure Electronic Signature(s) Signed: 03/30/2023 4:52:49 PM By: Baltazar Najjar MD Signed: 03/30/2023 5:19:03 PM By: Shawn Stall RN, BSN Entered By: Shawn Stall on 03/30/2023 14:52:18 -------------------------------------------------------------------------------- Debridement Details Patient Name: Date of Service: [name], [name] 03/30/2023 1:15 PM Medical Record Number: [id number] Patient Account Number: [id number] Date of Birth/Sex: Treating RN: 10-20-1985 (37 y.o. [name], [name] Primary Care Provider: Sanda Linger Other Clinician: Referring Provider: Treating Provider/Extender: Madelyn Flavors in Treatment: 1 Debridement Performed for Assessment: Wound #3 Left,Proximal,Dorsal Foot Performed By: Clinician Shawn Stall, RN Debridement Type: Chemical/Enzymatic/Mechanical Agent Used: Santyl Level of Consciousness (Pre-procedure): Awake and Alert Pre-procedure Verification/Time Out No Taken: Percent of Wound Bed Debrided: Bleeding: None Response to Treatment: Procedure was tolerated well Level of Consciousness (Post- Awake and Alert procedure): Post Debridement Measurements of Total Wound Length: (cm) 8 Width: (cm) 8 Depth: (cm) 0.1 Volume: (cm) 5.027 Character of Wound/Ulcer Post Debridement: Stable Post Procedure Diagnosis Same as  Pre-procedure Electronic Signature(s) Signed: 03/30/2023 4:52:49 PM By: Baltazar Najjar MD Signed: 03/30/2023 5:19:03 PM By: Shawn Stall RN, BSN Entered By: Shawn Stall on 03/30/2023 14:52:31 -------------------------------------------------------------------------------- Debridement Details Patient Name: Date of Service: [name], [name] 03/30/2023 1:15 PM Medical Record Number: [id number] Patient Account Number: [id number] Date of Birth/Sex: Treating RN: 1985/12/31 (37 y.o. [name] Primary Care Provider: Sanda Linger Other Clinician: [name] ([id number]) [id number] Page 3 of 12 Referring Provider: Treating Provider/Extender: Madelyn Flavors in Treatment: 1 Debridement Performed for Assessment: Wound #4 Right,Dorsal Foot Performed By: Clinician Shawn Stall, RN Debridement Type: Chemical/Enzymatic/Mechanical Agent  Used: Santyl Level of Consciousness (Pre-procedure): Awake and Alert Pre-procedure Verification/Time Out No Taken: Percent of Wound Bed Debrided: Bleeding: None Response to Treatment: Procedure was tolerated well Level of Consciousness (Post- Awake and Alert procedure): Post Debridement Measurements of Total Wound Length: (cm) 5.5 Width: (cm) 8 Depth: (cm) 0.1 Volume: (cm) 3.456 Character of Wound/Ulcer Post Debridement: Stable Post Procedure Diagnosis Same as Pre-procedure Electronic Signature(s) Signed: 03/30/2023 4:52:49 PM By: Baltazar Najjar MD Signed: 03/30/2023 5:19:03 PM By: Shawn Stall RN, BSN Entered By: Shawn Stall on 03/30/2023 14:52:47 -------------------------------------------------------------------------------- Debridement Details Patient Name: Date of Service: [name], [name] 03/30/2023 1:15 PM Medical Record Number: [id number] Patient Account Number: [id number] Date of Birth/Sex: Treating RN: December 06, 1985 (37 y.o. [name], [name] Primary Care Provider: Sanda Linger Other Clinician: Referring Provider: Treating Provider/Extender: Madelyn Flavors in Treatment: 1 Debridement Performed for Assessment: Wound #5 Right,Lateral Calcaneus Performed By: Clinician Shawn Stall, RN Debridement Type: Chemical/Enzymatic/Mechanical Agent Used: Santyl Level of Consciousness (Pre-procedure): Awake and Alert Pre-procedure Verification/Time Out No Taken: Percent of Wound Bed Debrided: Bleeding: None Response to Treatment: Procedure was tolerated well Level of Consciousness (Post- Awake and Alert procedure): Post Debridement Measurements of Total Wound Length: (cm) 2 Width: (cm) 2.5 Depth: (cm) 0.1 Volume: (cm) 0.393 Character of Wound/Ulcer Post Debridement: Stable Post Procedure Diagnosis Same as Pre-procedure Electronic Signature(s) Signed: 03/30/2023 4:52:49 PM By: Baltazar Najjar MD Signed: 03/30/2023 5:19:03 PM By: Shawn Stall RN, BSN Entered By: Shawn Stall on 03/30/2023 14:53:01 Markus Jarvis R ([id number]) [id number] Page 4 of 12 -------------------------------------------------------------------------------- HPI Details Patient Name: Date of Service: [name] NDRA R. 03/30/2023 1:15 PM Medical Record Number: [id number] Patient Account Number: [id number] Date of Birth/Sex: Treating RN: April 15, 1986 (37 y.o. F) Primary Care Provider: Sanda Linger Other Clinician: Referring Provider: Treating Provider/Extender: Madelyn Flavors in Treatment: 1 History of Present Illness HPI Description: ADMISSION 03/23/2023 This is a 37 year old woman who is Story is somewhat difficult to follow however she apparently has developed bilateral lower extremity swelling involving her feet and ankles roughly 3 weeks ago. This but this became associated with small painful areas on predominantly her left greater than right foot with exfoliation of the skin. She was admitted to hospital  from 03/16/2023 through 03/19/2023. X-ray of the feet was negative however she was noted to have a sedimentation rate greater than 90. Although she was empirically given antibiotics at the start of the admission. She was seen by infectious disease who did not feel this was infectious. It was felt that she required a skin biopsy for possible vasculitis. Dermatology and rheumatology consults were suggested. She was discharged from the hospital on a gradually tapering dose of prednisone. She had cryoglobulins, complement levels measured. She did not have an active urinalysis. She has been soaking her feet in ice water and cold towels to relieve the pain. She saw her dermatologist who is in [place] yesterday for consideration of a skin biopsy. Per the patient's description the dermatologist did not wish to do a skin biopsy stating that he did not want to create another wound area although I do not have his or her notes at this point and I do not know what diagnosis she was felt to have. She arrives in clinic today with discomfort in her feet. Exfoliating skin on the dorsal surfaces of both feet punched-out areas with necrosis especially on the left dorsal foot left lateral ankle. There is also areas on the right heel. Past medical  Date of Service: [name] NDRA R. 03/30/2023 Medical Record Number: [id number] Patient Account Number: [id number] Date of Birth/Sex: Treating RN: 1985/07/12 (37 y.o. 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 limited to the skin Facility Procedures : CPT4 Code: 84696295 Description: 28413 - DEBRIDE W/O ANES NON SELECT Modifier: Quantity: 1 Physician Procedures : CPT4 Code Description Modifier [id number] 99213 - WC PHYS LEVEL 3 - EST PT ICD-10 Diagnosis Description L97.518 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 severity L97.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 03/30/2023 Medical Record Number: [id number] Patient Account Number: [id number] Date of Birth/Sex: Treating RN: 1985/07/12 (37 y.o. 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 limited to the skin Facility Procedures : CPT4 Code: 84696295 Description: 28413 - DEBRIDE W/O ANES NON SELECT Modifier: Quantity: 1 Physician Procedures : CPT4 Code Description Modifier [id number] 99213 - WC PHYS LEVEL 3 - EST PT ICD-10 Diagnosis Description L97.518 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 severity L97.528 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 Quantity: 1 Electronic Signature(s) Signed: 03/30/2023 4:52:49 PM By: Baltazar Najjar MD Entered By: Baltazar Najjar on 03/30/2023 15:22:32  Used: Santyl Level of Consciousness (Pre-procedure): Awake and Alert Pre-procedure Verification/Time Out No Taken: Percent of Wound Bed Debrided: Bleeding: None Response to Treatment: Procedure was tolerated well Level of Consciousness (Post- Awake and Alert procedure): Post Debridement Measurements of Total Wound Length: (cm) 5.5 Width: (cm) 8 Depth: (cm) 0.1 Volume: (cm) 3.456 Character of Wound/Ulcer Post Debridement: Stable Post Procedure Diagnosis Same as Pre-procedure Electronic Signature(s) Signed: 03/30/2023 4:52:49 PM By: Baltazar Najjar MD Signed: 03/30/2023 5:19:03 PM By: Shawn Stall RN, BSN Entered By: Shawn Stall on 03/30/2023 14:52:47 -------------------------------------------------------------------------------- Debridement Details Patient Name: Date of Service: [name], [name] 03/30/2023 1:15 PM Medical Record Number: [id number] Patient Account Number: [id number] Date of Birth/Sex: Treating RN: December 06, 1985 (37 y.o. [name], [name] Primary Care Provider: Sanda Linger Other Clinician: Referring Provider: Treating Provider/Extender: Madelyn Flavors in Treatment: 1 Debridement Performed for Assessment: Wound #5 Right,Lateral Calcaneus Performed By: Clinician Shawn Stall, RN Debridement Type: Chemical/Enzymatic/Mechanical Agent Used: Santyl Level of Consciousness (Pre-procedure): Awake and Alert Pre-procedure Verification/Time Out No Taken: Percent of Wound Bed Debrided: Bleeding: None Response to Treatment: Procedure was tolerated well Level of Consciousness (Post- Awake and Alert procedure): Post Debridement Measurements of Total Wound Length: (cm) 2 Width: (cm) 2.5 Depth: (cm) 0.1 Volume: (cm) 0.393 Character of Wound/Ulcer Post Debridement: Stable Post Procedure Diagnosis Same as Pre-procedure Electronic Signature(s) Signed: 03/30/2023 4:52:49 PM By: Baltazar Najjar MD Signed: 03/30/2023 5:19:03 PM By: Shawn Stall RN, BSN Entered By: Shawn Stall on 03/30/2023 14:53:01 Markus Jarvis R ([id number]) [id number] Page 4 of 12 -------------------------------------------------------------------------------- HPI Details Patient Name: Date of Service: [name] NDRA R. 03/30/2023 1:15 PM Medical Record Number: [id number] Patient Account Number: [id number] Date of Birth/Sex: Treating RN: April 15, 1986 (37 y.o. F) Primary Care Provider: Sanda Linger Other Clinician: Referring Provider: Treating Provider/Extender: Madelyn Flavors in Treatment: 1 History of Present Illness HPI Description: ADMISSION 03/23/2023 This is a 37 year old woman who is Story is somewhat difficult to follow however she apparently has developed bilateral lower extremity swelling involving her feet and ankles roughly 3 weeks ago. This but this became associated with small painful areas on predominantly her left greater than right foot with exfoliation of the skin. She was admitted to hospital  from 03/16/2023 through 03/19/2023. X-ray of the feet was negative however she was noted to have a sedimentation rate greater than 90. Although she was empirically given antibiotics at the start of the admission. She was seen by infectious disease who did not feel this was infectious. It was felt that she required a skin biopsy for possible vasculitis. Dermatology and rheumatology consults were suggested. She was discharged from the hospital on a gradually tapering dose of prednisone. She had cryoglobulins, complement levels measured. She did not have an active urinalysis. She has been soaking her feet in ice water and cold towels to relieve the pain. She saw her dermatologist who is in [place] yesterday for consideration of a skin biopsy. Per the patient's description the dermatologist did not wish to do a skin biopsy stating that he did not want to create another wound area although I do not have his or her notes at this point and I do not know what diagnosis she was felt to have. She arrives in clinic today with discomfort in her feet. Exfoliating skin on the dorsal surfaces of both feet punched-out areas with necrosis especially on the left dorsal foot left lateral ankle. There is also areas on the right heel. Past medical  LONIA, SWEEZEY R ([id number]) [id number] Page 1 of 12 Visit Report for 03/30/2023 Debridement Details Patient Name: Date of Service: [name] NDRA R. 03/30/2023 1:15 PM Medical Record Number: [id number] Patient Account Number: [id number] Date of Birth/Sex: Treating RN: 03/27/1986 (37 y.o. [name], [name] Primary Care Provider: Sanda Linger Other Clinician: Referring Provider: Treating Provider/Extender: Madelyn Flavors in Treatment: 1 Debridement Performed for Assessment: Wound #1 Left,Medial Foot Performed By: Clinician Shawn Stall, RN Debridement Type: Chemical/Enzymatic/Mechanical Agent Used: Santyl Level of Consciousness (Pre-procedure): Awake and Alert Pre-procedure Verification/Time Out No Taken: Percent of Wound Bed Debrided: Bleeding: None Response to Treatment: Procedure was tolerated well Level of Consciousness (Post- Awake and Alert procedure): Post Debridement Measurements of Total Wound Length: (cm) 7 Width: (cm) 1 Depth: (cm) 0.1 Volume: (cm) 0.55 Character of Wound/Ulcer Post Debridement: Stable Post Procedure Diagnosis Same as Pre-procedure Electronic Signature(s) Signed: 03/30/2023 4:52:49 PM By: Baltazar Najjar MD Signed: 03/30/2023 5:19:03 PM By: Shawn Stall RN, BSN Entered By: Shawn Stall on 03/30/2023 14:52:02 -------------------------------------------------------------------------------- Debridement Details Patient Name: Date of Service: [name], [name] 03/30/2023 1:15 PM Medical Record Number: [id number] Patient Account Number: [id number] Date of Birth/Sex: Treating RN: 1986-06-17 (37 y.o. [name], [name] Primary Care Provider: Sanda Linger Other Clinician: Referring Provider: Treating Provider/Extender: Madelyn Flavors in Treatment: 1 Debridement Performed for Assessment: Wound #2 Left,Distal Foot Performed By: Clinician Shawn Stall, RN Debridement Type:  Chemical/Enzymatic/Mechanical Agent Used: Santyl Level of Consciousness (Pre-procedure): Awake and Alert Pre-procedure Verification/Time Out No Taken: Percent of Wound Bed Debrided: Bleeding: None Response to Treatment: Procedure was tolerated well Level of Consciousness (Post- Awake and Alert procedure): TEONDRA, LANGIN R ([id number]) [id number] Page 2 of 12 Post Debridement Measurements of Total Wound Length: (cm) 2.5 Width: (cm) 3 Depth: (cm) 0.1 Volume: (cm) 0.589 Character of Wound/Ulcer Post Debridement: Stable Post Procedure Diagnosis Same as Pre-procedure Electronic Signature(s) Signed: 03/30/2023 4:52:49 PM By: Baltazar Najjar MD Signed: 03/30/2023 5:19:03 PM By: Shawn Stall RN, BSN Entered By: Shawn Stall on 03/30/2023 14:52:18 -------------------------------------------------------------------------------- Debridement Details Patient Name: Date of Service: [name], [name] 03/30/2023 1:15 PM Medical Record Number: [id number] Patient Account Number: [id number] Date of Birth/Sex: Treating RN: 10-20-1985 (37 y.o. [name], [name] Primary Care Provider: Sanda Linger Other Clinician: Referring Provider: Treating Provider/Extender: Madelyn Flavors in Treatment: 1 Debridement Performed for Assessment: Wound #3 Left,Proximal,Dorsal Foot Performed By: Clinician Shawn Stall, RN Debridement Type: Chemical/Enzymatic/Mechanical Agent Used: Santyl Level of Consciousness (Pre-procedure): Awake and Alert Pre-procedure Verification/Time Out No Taken: Percent of Wound Bed Debrided: Bleeding: None Response to Treatment: Procedure was tolerated well Level of Consciousness (Post- Awake and Alert procedure): Post Debridement Measurements of Total Wound Length: (cm) 8 Width: (cm) 8 Depth: (cm) 0.1 Volume: (cm) 5.027 Character of Wound/Ulcer Post Debridement: Stable Post Procedure Diagnosis Same as  Pre-procedure Electronic Signature(s) Signed: 03/30/2023 4:52:49 PM By: Baltazar Najjar MD Signed: 03/30/2023 5:19:03 PM By: Shawn Stall RN, BSN Entered By: Shawn Stall on 03/30/2023 14:52:31 -------------------------------------------------------------------------------- Debridement Details Patient Name: Date of Service: [name], [name] 03/30/2023 1:15 PM Medical Record Number: [id number] Patient Account Number: [id number] Date of Birth/Sex: Treating RN: 1985/12/31 (37 y.o. [name] Primary Care Provider: Sanda Linger Other Clinician: [name] ([id number]) [id number] Page 3 of 12 Referring Provider: Treating Provider/Extender: Madelyn Flavors in Treatment: 1 Debridement Performed for Assessment: Wound #4 Right,Dorsal Foot Performed By: Clinician Shawn Stall, RN Debridement Type: Chemical/Enzymatic/Mechanical Agent  Date of Service: [name] NDRA R. 03/30/2023 Medical Record Number: [id number] Patient Account Number: [id number] Date of Birth/Sex: Treating RN: 1985/07/12 (37 y.o. 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 limited to the skin Facility Procedures : CPT4 Code: 84696295 Description: 28413 - DEBRIDE W/O ANES NON SELECT Modifier: Quantity: 1 Physician Procedures : CPT4 Code Description Modifier [id number] 99213 - WC PHYS LEVEL 3 - EST PT ICD-10 Diagnosis Description L97.518 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 severity L97.528 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 03/30/2023 1:15 PM Medical Record Number: [id number] Patient Account Number: [id number] Date of Birth/Sex: Treating RN: 03/27/1986 (37 y.o. [name], [name] Primary Care Provider: Sanda Linger Other Clinician: Referring Provider: Treating Provider/Extender: Madelyn Flavors in Treatment: 1 Debridement Performed for Assessment: Wound #1 Left,Medial Foot Performed By: Clinician Shawn Stall, RN Debridement Type: Chemical/Enzymatic/Mechanical Agent Used: Santyl Level of Consciousness (Pre-procedure): Awake and Alert Pre-procedure Verification/Time Out No Taken: Percent of Wound Bed Debrided: Bleeding: None Response to Treatment: Procedure was tolerated well Level of Consciousness (Post- Awake and Alert procedure): Post Debridement Measurements of Total Wound Length: (cm) 7 Width: (cm) 1 Depth: (cm) 0.1 Volume: (cm) 0.55 Character of Wound/Ulcer Post Debridement: Stable Post Procedure Diagnosis Same as Pre-procedure Electronic Signature(s) Signed: 03/30/2023 4:52:49 PM By: Baltazar Najjar MD Signed: 03/30/2023 5:19:03 PM By: Shawn Stall RN, BSN Entered By: Shawn Stall on 03/30/2023 14:52:02 -------------------------------------------------------------------------------- Debridement Details Patient Name: Date of Service: [name], [name] 03/30/2023 1:15 PM Medical Record Number: [id number] Patient Account Number: [id number] Date of Birth/Sex: Treating RN: 1986-06-17 (37 y.o. [name], [name] Primary Care Provider: Sanda Linger Other Clinician: Referring Provider: Treating Provider/Extender: Madelyn Flavors in Treatment: 1 Debridement Performed for Assessment: Wound #2 Left,Distal Foot Performed By: Clinician Shawn Stall, RN Debridement Type:  Chemical/Enzymatic/Mechanical Agent Used: Santyl Level of Consciousness (Pre-procedure): Awake and Alert Pre-procedure Verification/Time Out No Taken: Percent of Wound Bed Debrided: Bleeding: None Response to Treatment: Procedure was tolerated well Level of Consciousness (Post- Awake and Alert procedure): TEONDRA, LANGIN R ([id number]) [id number] Page 2 of 12 Post Debridement Measurements of Total Wound Length: (cm) 2.5 Width: (cm) 3 Depth: (cm) 0.1 Volume: (cm) 0.589 Character of Wound/Ulcer Post Debridement: Stable Post Procedure Diagnosis Same as Pre-procedure Electronic Signature(s) Signed: 03/30/2023 4:52:49 PM By: Baltazar Najjar MD Signed: 03/30/2023 5:19:03 PM By: Shawn Stall RN, BSN Entered By: Shawn Stall on 03/30/2023 14:52:18 -------------------------------------------------------------------------------- Debridement Details Patient Name: Date of Service: [name], [name] 03/30/2023 1:15 PM Medical Record Number: [id number] Patient Account Number: [id number] Date of Birth/Sex: Treating RN: 10-20-1985 (37 y.o. [name], [name] Primary Care Provider: Sanda Linger Other Clinician: Referring Provider: Treating Provider/Extender: Madelyn Flavors in Treatment: 1 Debridement Performed for Assessment: Wound #3 Left,Proximal,Dorsal Foot Performed By: Clinician Shawn Stall, RN Debridement Type: Chemical/Enzymatic/Mechanical Agent Used: Santyl Level of Consciousness (Pre-procedure): Awake and Alert Pre-procedure Verification/Time Out No Taken: Percent of Wound Bed Debrided: Bleeding: None Response to Treatment: Procedure was tolerated well Level of Consciousness (Post- Awake and Alert procedure): Post Debridement Measurements of Total Wound Length: (cm) 8 Width: (cm) 8 Depth: (cm) 0.1 Volume: (cm) 5.027 Character of Wound/Ulcer Post Debridement: Stable Post Procedure Diagnosis Same as  Pre-procedure Electronic Signature(s) Signed: 03/30/2023 4:52:49 PM By: Baltazar Najjar MD Signed: 03/30/2023 5:19:03 PM By: Shawn Stall RN, BSN Entered By: Shawn Stall on 03/30/2023 14:52:31 -------------------------------------------------------------------------------- Debridement Details Patient Name: Date of Service: [name], [name] 03/30/2023 1:15 PM Medical Record Number: [id number] Patient Account Number: [id number] Date of Birth/Sex: Treating RN: 1985/12/31 (37 y.o. [name] Primary Care Provider: Sanda Linger Other Clinician: [name] ([id number]) [id number] Page 3 of 12 Referring Provider: Treating Provider/Extender: Madelyn Flavors in Treatment: 1 Debridement Performed for Assessment: Wound #4 Right,Dorsal Foot Performed By: Clinician Shawn Stall, RN Debridement Type: Chemical/Enzymatic/Mechanical Agent  Used: Santyl Level of Consciousness (Pre-procedure): Awake and Alert Pre-procedure Verification/Time Out No Taken: Percent of Wound Bed Debrided: Bleeding: None Response to Treatment: Procedure was tolerated well Level of Consciousness (Post- Awake and Alert procedure): Post Debridement Measurements of Total Wound Length: (cm) 5.5 Width: (cm) 8 Depth: (cm) 0.1 Volume: (cm) 3.456 Character of Wound/Ulcer Post Debridement: Stable Post Procedure Diagnosis Same as Pre-procedure Electronic Signature(s) Signed: 03/30/2023 4:52:49 PM By: Baltazar Najjar MD Signed: 03/30/2023 5:19:03 PM By: Shawn Stall RN, BSN Entered By: Shawn Stall on 03/30/2023 14:52:47 -------------------------------------------------------------------------------- Debridement Details Patient Name: Date of Service: [name], [name] 03/30/2023 1:15 PM Medical Record Number: [id number] Patient Account Number: [id number] Date of Birth/Sex: Treating RN: December 06, 1985 (37 y.o. [name], [name] Primary Care Provider: Sanda Linger Other Clinician: Referring Provider: Treating Provider/Extender: Madelyn Flavors in Treatment: 1 Debridement Performed for Assessment: Wound #5 Right,Lateral Calcaneus Performed By: Clinician Shawn Stall, RN Debridement Type: Chemical/Enzymatic/Mechanical Agent Used: Santyl Level of Consciousness (Pre-procedure): Awake and Alert Pre-procedure Verification/Time Out No Taken: Percent of Wound Bed Debrided: Bleeding: None Response to Treatment: Procedure was tolerated well Level of Consciousness (Post- Awake and Alert procedure): Post Debridement Measurements of Total Wound Length: (cm) 2 Width: (cm) 2.5 Depth: (cm) 0.1 Volume: (cm) 0.393 Character of Wound/Ulcer Post Debridement: Stable Post Procedure Diagnosis Same as Pre-procedure Electronic Signature(s) Signed: 03/30/2023 4:52:49 PM By: Baltazar Najjar MD Signed: 03/30/2023 5:19:03 PM By: Shawn Stall RN, BSN Entered By: Shawn Stall on 03/30/2023 14:53:01 Markus Jarvis R ([id number]) [id number] Page 4 of 12 -------------------------------------------------------------------------------- HPI Details Patient Name: Date of Service: [name] NDRA R. 03/30/2023 1:15 PM Medical Record Number: [id number] Patient Account Number: [id number] Date of Birth/Sex: Treating RN: April 15, 1986 (37 y.o. F) Primary Care Provider: Sanda Linger Other Clinician: Referring Provider: Treating Provider/Extender: Madelyn Flavors in Treatment: 1 History of Present Illness HPI Description: ADMISSION 03/23/2023 This is a 37 year old woman who is Story is somewhat difficult to follow however she apparently has developed bilateral lower extremity swelling involving her feet and ankles roughly 3 weeks ago. This but this became associated with small painful areas on predominantly her left greater than right foot with exfoliation of the skin. She was admitted to hospital  from 03/16/2023 through 03/19/2023. X-ray of the feet was negative however she was noted to have a sedimentation rate greater than 90. Although she was empirically given antibiotics at the start of the admission. She was seen by infectious disease who did not feel this was infectious. It was felt that she required a skin biopsy for possible vasculitis. Dermatology and rheumatology consults were suggested. She was discharged from the hospital on a gradually tapering dose of prednisone. She had cryoglobulins, complement levels measured. She did not have an active urinalysis. She has been soaking her feet in ice water and cold towels to relieve the pain. She saw her dermatologist who is in [place] yesterday for consideration of a skin biopsy. Per the patient's description the dermatologist did not wish to do a skin biopsy stating that he did not want to create another wound area although I do not have his or her notes at this point and I do not know what diagnosis she was felt to have. She arrives in clinic today with discomfort in her feet. Exfoliating skin on the dorsal surfaces of both feet punched-out areas with necrosis especially on the left dorsal foot left lateral ankle. There is also areas on the right heel. Past medical  LONIA, SWEEZEY R ([id number]) [id number] Page 1 of 12 Visit Report for 03/30/2023 Debridement Details Patient Name: Date of Service: [name] NDRA R. 03/30/2023 1:15 PM Medical Record Number: [id number] Patient Account Number: [id number] Date of Birth/Sex: Treating RN: 03/27/1986 (37 y.o. [name], [name] Primary Care Provider: Sanda Linger Other Clinician: Referring Provider: Treating Provider/Extender: Madelyn Flavors in Treatment: 1 Debridement Performed for Assessment: Wound #1 Left,Medial Foot Performed By: Clinician Shawn Stall, RN Debridement Type: Chemical/Enzymatic/Mechanical Agent Used: Santyl Level of Consciousness (Pre-procedure): Awake and Alert Pre-procedure Verification/Time Out No Taken: Percent of Wound Bed Debrided: Bleeding: None Response to Treatment: Procedure was tolerated well Level of Consciousness (Post- Awake and Alert procedure): Post Debridement Measurements of Total Wound Length: (cm) 7 Width: (cm) 1 Depth: (cm) 0.1 Volume: (cm) 0.55 Character of Wound/Ulcer Post Debridement: Stable Post Procedure Diagnosis Same as Pre-procedure Electronic Signature(s) Signed: 03/30/2023 4:52:49 PM By: Baltazar Najjar MD Signed: 03/30/2023 5:19:03 PM By: Shawn Stall RN, BSN Entered By: Shawn Stall on 03/30/2023 14:52:02 -------------------------------------------------------------------------------- Debridement Details Patient Name: Date of Service: [name], [name] 03/30/2023 1:15 PM Medical Record Number: [id number] Patient Account Number: [id number] Date of Birth/Sex: Treating RN: 1986-06-17 (37 y.o. [name], [name] Primary Care Provider: Sanda Linger Other Clinician: Referring Provider: Treating Provider/Extender: Madelyn Flavors in Treatment: 1 Debridement Performed for Assessment: Wound #2 Left,Distal Foot Performed By: Clinician Shawn Stall, RN Debridement Type:  Chemical/Enzymatic/Mechanical Agent Used: Santyl Level of Consciousness (Pre-procedure): Awake and Alert Pre-procedure Verification/Time Out No Taken: Percent of Wound Bed Debrided: Bleeding: None Response to Treatment: Procedure was tolerated well Level of Consciousness (Post- Awake and Alert procedure): TEONDRA, LANGIN R ([id number]) [id number] Page 2 of 12 Post Debridement Measurements of Total Wound Length: (cm) 2.5 Width: (cm) 3 Depth: (cm) 0.1 Volume: (cm) 0.589 Character of Wound/Ulcer Post Debridement: Stable Post Procedure Diagnosis Same as Pre-procedure Electronic Signature(s) Signed: 03/30/2023 4:52:49 PM By: Baltazar Najjar MD Signed: 03/30/2023 5:19:03 PM By: Shawn Stall RN, BSN Entered By: Shawn Stall on 03/30/2023 14:52:18 -------------------------------------------------------------------------------- Debridement Details Patient Name: Date of Service: [name], [name] 03/30/2023 1:15 PM Medical Record Number: [id number] Patient Account Number: [id number] Date of Birth/Sex: Treating RN: 10-20-1985 (37 y.o. [name], [name] Primary Care Provider: Sanda Linger Other Clinician: Referring Provider: Treating Provider/Extender: Madelyn Flavors in Treatment: 1 Debridement Performed for Assessment: Wound #3 Left,Proximal,Dorsal Foot Performed By: Clinician Shawn Stall, RN Debridement Type: Chemical/Enzymatic/Mechanical Agent Used: Santyl Level of Consciousness (Pre-procedure): Awake and Alert Pre-procedure Verification/Time Out No Taken: Percent of Wound Bed Debrided: Bleeding: None Response to Treatment: Procedure was tolerated well Level of Consciousness (Post- Awake and Alert procedure): Post Debridement Measurements of Total Wound Length: (cm) 8 Width: (cm) 8 Depth: (cm) 0.1 Volume: (cm) 5.027 Character of Wound/Ulcer Post Debridement: Stable Post Procedure Diagnosis Same as  Pre-procedure Electronic Signature(s) Signed: 03/30/2023 4:52:49 PM By: Baltazar Najjar MD Signed: 03/30/2023 5:19:03 PM By: Shawn Stall RN, BSN Entered By: Shawn Stall on 03/30/2023 14:52:31 -------------------------------------------------------------------------------- Debridement Details Patient Name: Date of Service: [name], [name] 03/30/2023 1:15 PM Medical Record Number: [id number] Patient Account Number: [id number] Date of Birth/Sex: Treating RN: 1985/12/31 (37 y.o. [name] Primary Care Provider: Sanda Linger Other Clinician: [name] ([id number]) [id number] Page 3 of 12 Referring Provider: Treating Provider/Extender: Madelyn Flavors in Treatment: 1 Debridement Performed for Assessment: Wound #4 Right,Dorsal Foot Performed By: Clinician Shawn Stall, RN Debridement Type: Chemical/Enzymatic/Mechanical Agent  Used: Santyl Level of Consciousness (Pre-procedure): Awake and Alert Pre-procedure Verification/Time Out No Taken: Percent of Wound Bed Debrided: Bleeding: None Response to Treatment: Procedure was tolerated well Level of Consciousness (Post- Awake and Alert procedure): Post Debridement Measurements of Total Wound Length: (cm) 5.5 Width: (cm) 8 Depth: (cm) 0.1 Volume: (cm) 3.456 Character of Wound/Ulcer Post Debridement: Stable Post Procedure Diagnosis Same as Pre-procedure Electronic Signature(s) Signed: 03/30/2023 4:52:49 PM By: Baltazar Najjar MD Signed: 03/30/2023 5:19:03 PM By: Shawn Stall RN, BSN Entered By: Shawn Stall on 03/30/2023 14:52:47 -------------------------------------------------------------------------------- Debridement Details Patient Name: Date of Service: [name], [name] 03/30/2023 1:15 PM Medical Record Number: [id number] Patient Account Number: [id number] Date of Birth/Sex: Treating RN: December 06, 1985 (37 y.o. [name], [name] Primary Care Provider: Sanda Linger Other Clinician: Referring Provider: Treating Provider/Extender: Madelyn Flavors in Treatment: 1 Debridement Performed for Assessment: Wound #5 Right,Lateral Calcaneus Performed By: Clinician Shawn Stall, RN Debridement Type: Chemical/Enzymatic/Mechanical Agent Used: Santyl Level of Consciousness (Pre-procedure): Awake and Alert Pre-procedure Verification/Time Out No Taken: Percent of Wound Bed Debrided: Bleeding: None Response to Treatment: Procedure was tolerated well Level of Consciousness (Post- Awake and Alert procedure): Post Debridement Measurements of Total Wound Length: (cm) 2 Width: (cm) 2.5 Depth: (cm) 0.1 Volume: (cm) 0.393 Character of Wound/Ulcer Post Debridement: Stable Post Procedure Diagnosis Same as Pre-procedure Electronic Signature(s) Signed: 03/30/2023 4:52:49 PM By: Baltazar Najjar MD Signed: 03/30/2023 5:19:03 PM By: Shawn Stall RN, BSN Entered By: Shawn Stall on 03/30/2023 14:53:01 Markus Jarvis R ([id number]) [id number] Page 4 of 12 -------------------------------------------------------------------------------- HPI Details Patient Name: Date of Service: [name] NDRA R. 03/30/2023 1:15 PM Medical Record Number: [id number] Patient Account Number: [id number] Date of Birth/Sex: Treating RN: April 15, 1986 (37 y.o. F) Primary Care Provider: Sanda Linger Other Clinician: Referring Provider: Treating Provider/Extender: Madelyn Flavors in Treatment: 1 History of Present Illness HPI Description: ADMISSION 03/23/2023 This is a 37 year old woman who is Story is somewhat difficult to follow however she apparently has developed bilateral lower extremity swelling involving her feet and ankles roughly 3 weeks ago. This but this became associated with small painful areas on predominantly her left greater than right foot with exfoliation of the skin. She was admitted to hospital  from 03/16/2023 through 03/19/2023. X-ray of the feet was negative however she was noted to have a sedimentation rate greater than 90. Although she was empirically given antibiotics at the start of the admission. She was seen by infectious disease who did not feel this was infectious. It was felt that she required a skin biopsy for possible vasculitis. Dermatology and rheumatology consults were suggested. She was discharged from the hospital on a gradually tapering dose of prednisone. She had cryoglobulins, complement levels measured. She did not have an active urinalysis. She has been soaking her feet in ice water and cold towels to relieve the pain. She saw her dermatologist who is in [place] yesterday for consideration of a skin biopsy. Per the patient's description the dermatologist did not wish to do a skin biopsy stating that he did not want to create another wound area although I do not have his or her notes at this point and I do not know what diagnosis she was felt to have. She arrives in clinic today with discomfort in her feet. Exfoliating skin on the dorsal surfaces of both feet punched-out areas with necrosis especially on the left dorsal foot left lateral ankle. There is also areas on the right heel. Past medical  history includes acute hepatitis question involving alcohol or hepatitis A, bilateral leg edema, obesity, hypertension, history of psoriasis on [place], chronic pain, apparently some form of idiopathic peripheral neuropathy, vitamin B12 deficiency ABIs in our clinic were 1.03 on the right 1.25 on the left Addendum 03/25/2023. I received the records from Dr. Cheree Ditto at [facility] dermatology in [place]. Looking at her record she felt that the issue was secondary to lymphedema and stasis dermatitis on both legs. I am not sure whether she looked at the areas on her feet which was the real area of concern. She specifically stated that she did not think that this was related to her psoriasis.  Compression stockings were suggested. No biopsy was done 9/24; as noted above I did receive records from Dr. Cheree Ditto at [facility] dermatology and [place]. She felt the skin changes on her feet were related to lymphedema and stasis dermatitis. The patient arrived back in clinic the skin on her dorsal feet looks a lot better and she is in a lot less pain. HOWEVER she does have multiple small punched- out areas covered in black eschar. I did not biopsy this when I saw her the first time as she was going to dermatology the next day in retrospect that may not have been the right decision. 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 Electronic Signature(s) Signed: 03/30/2023 4:52:49 PM By: Baltazar Najjar MD Entered By: Baltazar Najjar on 03/30/2023 15:19:07 -------------------------------------------------------------------------------- Physical Exam Details Patient Name: Date of Service: [name], [name] 03/30/2023 1:15 PM Medical Record Number: [id number] Patient Account Number: [id number] Date of Birth/Sex: Treating RN: [birth year] (37 y.o. F) Primary Care Provider: Sanda Linger Other Clinician: Referring Provider: Treating Provider/Extender: Madelyn Flavors in Treatment: 1 Constitutional Sitting or standing Blood Pressure is within target range for patient.. Pulse regular and within target range for patient.Marland Kitchen Respirations regular, non-labored and within target range.. Temperature is normal and within the target range for the patient.Marland Kitchen Appears in no distress. Notes ELEXIS, SIEDLECKI R ([id number]) [id number] Page 5 of 12 Wound exam; the areas on the dorsal part of her foot for the most part and on the lateral part of her right heel. Small punched-out areas covered with a necrotic surface. The rest of her skin on the dorsal feet looks a lot better she is  in a lot less pain. Her peripheral pulses are palpable. She has nodules on both legs below the knees this does not look like classic psoriasis either however she does have skin changes of psoriasis in the upper thighs dorsal aspect of her arms Electronic Signature(s) Signed: 03/30/2023 4:52:49 PM By: Baltazar Najjar MD Entered By: Baltazar Najjar on 03/30/2023 15:20:21 -------------------------------------------------------------------------------- Physician Orders Details Patient Name: Date of Service: [name], [name] 03/30/2023 1:15 PM Medical Record Number: [id number] Patient Account Number: [id number] Date of Birth/Sex: Treating RN: 09-03-1985 (37 y.o. 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 - Dr. Leanord Hawking 04/06/2023 115pm Room 8 Return A ppointment in 2 weeks. -

## 2023-04-01 ENCOUNTER — Encounter (HOSPITAL_BASED_OUTPATIENT_CLINIC_OR_DEPARTMENT_OTHER): Payer: BC Managed Care – PPO | Admitting: Internal Medicine

## 2023-04-01 ENCOUNTER — Telehealth: Payer: Self-pay

## 2023-04-01 DIAGNOSIS — E669 Obesity, unspecified: Secondary | ICD-10-CM | POA: Diagnosis not present

## 2023-04-01 DIAGNOSIS — G8929 Other chronic pain: Secondary | ICD-10-CM | POA: Diagnosis not present

## 2023-04-01 DIAGNOSIS — I890 Lymphedema, not elsewhere classified: Secondary | ICD-10-CM | POA: Diagnosis not present

## 2023-04-01 DIAGNOSIS — L97518 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 severity: Secondary | ICD-10-CM | POA: Diagnosis not present

## 2023-04-01 DIAGNOSIS — L97528 Non-pressure chronic ulcer of other part of left foot with other specified severity: Secondary | ICD-10-CM | POA: Diagnosis not present

## 2023-04-01 DIAGNOSIS — L409 Psoriasis, unspecified: Secondary | ICD-10-CM | POA: Diagnosis not present

## 2023-04-01 DIAGNOSIS — I10 Essential (primary) hypertension: Secondary | ICD-10-CM | POA: Diagnosis not present

## 2023-04-01 DIAGNOSIS — E538 Deficiency of other specified B group vitamins: Secondary | ICD-10-CM | POA: Diagnosis not present

## 2023-04-01 DIAGNOSIS — Z6841 Body Mass Index (BMI) 40.0 and over, adult: Secondary | ICD-10-CM | POA: Diagnosis not present

## 2023-04-01 NOTE — Progress Notes (Signed)
Care Guide Note  04/01/2023 Name: [name] MRN: [id number] DOB: 1985/09/20  Referred by: Etta Grandchild, MD Reason for referral : Care Coordination (Outreach to schedule with Pharm d )   [name] is a 37 y.o. year old female who is a primary care patient of Etta Grandchild, MD. [name] was referred to the pharmacist for assistance related to HTN.    Successful contact was made with the patient to discuss pharmacy services including being ready for the pharmacist to call at least 5 minutes before the scheduled appointment time, to have medication bottles and any blood sugar or blood pressure readings ready for review. The patient agreed to meet with the pharmacist via with the pharmacist via telephone visit on (date/time).  04/06/2023  [name], RMA Care Guide [facility]  [place], [place] [address] Direct Dial: [phone] [name]

## 2023-04-01 NOTE — Progress Notes (Signed)
over Kerlix as directed. Compression Stockings Add-Ons Electronic Signature(s) Signed: 04/01/2023 1:43:41 PM By: Karie Schwalbe RN Entered By: Karie Schwalbe on 04/01/2023 11:47:44 -------------------------------------------------------------------------------- Wound Assessment Details Patient Name: Date of Service: [name], [name] 04/01/2023 10:30 A M Medical Record Number: [id number] Patient Account Number: [id number] Date of Birth/Sex: Treating RN: [birth year] (37 y.o. Katrinka Blazing Primary Care Remie Mathison: Sanda Linger Other Clinician: Referring Tab Rylee: Treating Senaida Chilcote/Extender: Madelyn Flavors in Treatment: 1 Wound Status Wound Number: 4 Primary Etiology: Lymphedema Wound Location: Right, Dorsal Foot Wound Status: Open Wounding Event: Gradually Appeared Comorbid History: Anemia, Sleep Apnea, Vasculitis, Neuropathy Date Acquired: 03/09/2023 Weeks Of Treatment: 1 Clustered Wound: Yes Wound Measurements Length: (cm) 5.5 Width: (cm) 8 Depth: (cm) 0.1 Clustered Quantity: 4 Area: (cm) 34.558 Volume: (cm) 3.456 Bunn, Laytoya R ([id number]) % Reduction in Area: 8.3% % Reduction in Volume: 8.3% Epithelialization: None Tunneling: No Undermining: No [id number] Page 9 of 11 Wound Description Classification: Full Thickness Without Exposed Support Structures Wound Margin: Distinct, outline attached Exudate Amount: Medium Exudate Type: Serosanguineous Exudate Color: red, brown Foul Odor After Cleansing: No Slough/Fibrino Yes Wound Bed Granulation Amount: Medium (34-66%) Exposed Structure Granulation Quality: Red, Pink Fascia Exposed: No Necrotic Amount: Medium (34-66%) Fat Layer (Subcutaneous Tissue) Exposed: No Necrotic  Quality: Eschar, Adherent Slough Tendon Exposed: No Muscle Exposed: No Joint Exposed: No Bone Exposed: No Periwound Skin Texture Texture Color No Abnormalities Noted: No No Abnormalities Noted: No Callus: No Atrophie Blanche: No Crepitus: No Cyanosis: No Excoriation: No Ecchymosis: No Induration: No Erythema: No Rash: No Hemosiderin Staining: No Scarring: No Mottled: No Pallor: No Moisture Rubor: No No Abnormalities Noted: No Dry / Scaly: Yes Maceration: No Treatment Notes Wound #4 (Foot) Wound Laterality: Dorsal, Right Cleanser Soap and Water Discharge Instruction: May shower and wash wound with dial antibacterial soap and water prior to dressing change. Vashe 5.8 (oz) Discharge Instruction: Cleanse the wound with Vashe prior to applying a clean dressing using gauze sponges, not tissue or cotton balls. Peri-Wound Care Triamcinolone 15 (g) Discharge Instruction: Use triamcinolone ****liberally*** Sween Lotion (Moisturizing lotion) Discharge Instruction: Apply moisturizing lotion as directed Topical Triamcinolone Discharge Instruction: Apply Triamcinolone as directed liberallly Primary Dressing Hydrofera Blue Ready Transfer Foam, 2.5x2.5 (in/in) Discharge Instruction: Apply directly to wound bed as directed Santyl Ointment Discharge Instruction: Apply nickel thick amount to wound bed as instructed Secondary Dressing ABD Pad, 8x10 Discharge Instruction: Apply over primary dressing as directed. Woven Gauze Sponge, Non-Sterile 4x4 in Discharge Instruction: Apply over primary dressing as directed. Secured With Compression Wrap Kerlix Roll 4.5x3.1 (in/yd) Discharge Instruction: Apply Kerlix and Coban compression as directed. Coban Self-Adherent Wrap 4x5 (in/yd) Discharge Instruction: Apply over Kerlix as directed. Compression Stockings CARDELIA, ROCHLIN R ([id number]) [id number] Page 10 of 11 Add-Ons Electronic Signature(s) Signed:  04/01/2023 1:43:41 PM By: Karie Schwalbe RN Entered By: Karie Schwalbe on 04/01/2023 11:48:05 -------------------------------------------------------------------------------- Wound Assessment Details Patient Name: Date of Service: [name], [name] 04/01/2023 10:30 A M Medical Record Number: [id number] Patient Account Number: [id number] Date of Birth/Sex: Treating RN: Aug 31, 1985 (37 y.o. Katrinka Blazing Primary Care Chaela Branscum: Sanda Linger Other Clinician: Referring Inna Tisdell: Treating Kennede Lusk/Extender: Madelyn Flavors in Treatment: 1 Wound Status Wound Number: 5 Primary Etiology: Lymphedema Wound Location: Right, Lateral Calcaneus Wound Status: Open Wounding Event: Gradually Appeared Comorbid History: Anemia, Sleep Apnea, Vasculitis, Neuropathy Date Acquired: 03/09/2023 Weeks Of Treatment: 1 Clustered Wound: No Wound Measurements Length: (cm) 2 Width: (cm) 2.5 Depth: (cm) 0.1 Area: (cm) 3.927  BOSTON, SCHIED R ([id number]) [id number] Page 1 of 11 Visit Report for 04/01/2023 Arrival Information Details Patient Name: Date of Service: GA MMA [name] NDRA R. 04/01/2023 10:30 A M Medical Record Number: [id number] Patient Account Number: [id number] Date of Birth/Sex: Treating RN: [birth year] (37 y.o. Katrinka Blazing Primary Care Kadisha Goodine: Sanda Linger Other Clinician: Referring Jomo Forand: Treating Aamilah Augenstein/Extender: Madelyn Flavors in Treatment: 1 Visit Information History Since Last Visit Added or deleted any medications: No Patient Arrived: Ambulatory Any new allergies or adverse reactions: No Arrival Time: 10:35 Had a fall or experienced change in No Accompanied By: friend activities of daily living that may affect Transfer Assistance: None risk of falls: Patient Identification Verified: Yes Signs or symptoms of abuse/neglect since last visito No Patient Requires Transmission-Based Precautions: No Hospitalized since last visit: No Patient Has Alerts: No Implantable device outside of the clinic excluding No cellular tissue based products placed in the center since last visit: Has Dressing in Place as Prescribed: Yes Pain Present Now: No Electronic Signature(s) Signed: 04/01/2023 4:09:20 PM By: Thayer Dallas Entered By: Thayer Dallas on 04/01/2023 11:42:40 -------------------------------------------------------------------------------- Clinic Level of Care Assessment Details Patient Name: Date of Service: GA MMA [name] NDRA R. 04/01/2023 10:30 A M Medical Record Number: [id number] Patient Account Number: [id number] Date of Birth/Sex: Treating RN: March 29, 1986 (37 y.o. Katrinka Blazing Primary Care Jahmar Mckelvy: Sanda Linger Other Clinician: Referring Jaleeyah Munce: Treating Kendle Turbin/Extender: Madelyn Flavors in Treatment: 1 Clinic Level of Care Assessment Items TOOL 4 Quantity Score X- 1 0 Use when only an  EandM is performed on FOLLOW-UP visit ASSESSMENTS - Nursing Assessment / Reassessment X- 1 10 Reassessment of Co-morbidities (includes updates in patient status) X- 1 5 Reassessment of Adherence to Treatment Plan ASSESSMENTS - Wound and Skin A ssessment / Reassessment []  - 0 Simple Wound Assessment / Reassessment - one wound []  - 0 Complex Wound Assessment / Reassessment - multiple wounds []  - 0 Dermatologic / Skin Assessment (not related to wound area) ASSESSMENTS - Focused Assessment []  - 0 Circumferential Edema Measurements - multi extremities []  - 0 Nutritional Assessment / Counseling / Intervention LOLETHA, TEATE R ([id number]) [id number].pdf Page 2 of 11 []  - 0 Lower Extremity Assessment (monofilament, tuning fork, pulses) []  - 0 Peripheral Arterial Disease Assessment (using hand held doppler) ASSESSMENTS - Ostomy and/or Continence Assessment and Care []  - 0 Incontinence Assessment and Management []  - 0 Ostomy Care Assessment and Management (repouching, etc.) PROCESS - Coordination of Care X - Simple Patient / Family Education for ongoing care 1 15 []  - 0 Complex (extensive) Patient / Family Education for ongoing care X- 1 10 Staff obtains Chiropractor, Records, T Results / Process Orders est X- 1 10 Staff telephones HHA, Nursing Homes / Clarify orders / etc []  - 0 Routine Transfer to another Facility (non-emergent condition) []  - 0 Routine Hospital Admission (non-emergent condition) []  - 0 New Admissions / Manufacturing engineer / Ordering NPWT Apligraf, etc. , []  - 0 Emergency Hospital Admission (emergent condition) []  - 0 Simple Discharge Coordination X- 1 15 Complex (extensive) Discharge Coordination PROCESS - Special Needs []  - 0 Pediatric / Minor Patient Management []  - 0 Isolation Patient Management []  - 0 Hearing / Language / Visual special needs []  - 0 Assessment of Community assistance (transportation, D/C planning,  etc.) []  - 0 Additional assistance / Altered mentation []  - 0 Support Surface(s) Assessment (bed, cushion, seat, etc.) INTERVENTIONS - Wound Cleansing / Measurement []  - 0 Simple Wound Cleansing -  Vashe 5.8 (oz) Discharge Instruction: Cleanse the wound with Vashe prior to applying a clean dressing using gauze sponges, not tissue or cotton balls. Peri-Wound Care Triamcinolone 15 (g) Discharge Instruction: Use triamcinolone ****liberally*** Sween Lotion (Moisturizing lotion) Discharge Instruction: Apply moisturizing lotion as directed Topical Triamcinolone Discharge Instruction: Apply Triamcinolone as directed liberallly Primary Dressing Hydrofera Blue Ready Transfer Foam, 2.5x2.5 (in/in) Discharge Instruction: Apply directly to wound bed as  directed Santyl Ointment Discharge Instruction: Apply nickel thick amount to wound bed as instructed Secondary Dressing ABD Pad, 8x10 Discharge Instruction: Apply over primary dressing as directed. Woven Gauze Sponge, Non-Sterile 4x4 in Discharge Instruction: Apply over primary dressing as directed. Secured With Compression Wrap Kerlix Roll 4.5x3.1 (in/yd) Discharge Instruction: Apply Kerlix and Coban compression as directed. Coban Self-Adherent Wrap 4x5 (in/yd) Discharge Instruction: Apply over Kerlix as directed. Compression Stockings Add-Ons RIKAYLA, GROAH R ([id number]) [id number] Page 7 of 11 Electronic Signature(s) Signed: 04/01/2023 1:43:41 PM By: Karie Schwalbe RN Entered By: Karie Schwalbe on 04/01/2023 11:47:26 -------------------------------------------------------------------------------- Wound Assessment Details Patient Name: Date of Service: [name], [name] 04/01/2023 10:30 A M Medical Record Number: [id number] Patient Account Number: [id number] Date of Birth/Sex: Treating RN: [birth year] (37 y.o. Katrinka Blazing Primary Care Olisa Quesnel: Sanda Linger Other Clinician: Referring Aj Crunkleton: Treating Takako Minckler/Extender: Madelyn Flavors in Treatment: 1 Wound Status Wound Number: 3 Primary Etiology: Lymphedema Wound Location: Left, Proximal, Dorsal Foot Wound Status: Open Wounding Event: Gradually Appeared Comorbid History: Anemia, Sleep Apnea, Vasculitis, Neuropathy Date Acquired: 03/09/2023 Weeks Of Treatment: 1 Clustered Wound: Yes Wound Measurements Length: (cm) Width: (cm) Depth: (cm) Clustered Quantity: Area: (cm) Volume: (cm) 8 % Reduction in Area: -207.7% 8 % Reduction in Volume: -53.9% 0.1 Epithelialization: None 9 Tunneling: No 50.265 Undermining: No 5.027 Wound Description Classification: Unclassifiable Wound Margin: Distinct, outline attached Exudate Amount: Medium Exudate Type:  Serosanguineous Exudate Color: red, brown Foul Odor After Cleansing: No Slough/Fibrino Yes Wound Bed Granulation Amount: None Present (0%) Exposed Structure Necrotic Amount: Large (67-100%) Fascia Exposed: No Necrotic Quality: Eschar, Adherent Slough Fat Layer (Subcutaneous Tissue) Exposed: No Tendon Exposed: No Muscle Exposed: No Joint Exposed: No Bone Exposed: No Periwound Skin Texture Texture Color No Abnormalities Noted: No No Abnormalities Noted: No Callus: No Atrophie Blanche: No Crepitus: No Cyanosis: No Excoriation: No Ecchymosis: No Induration: No Erythema: No Rash: No Hemosiderin Staining: No Scarring: No Mottled: No Pallor: No Moisture Rubor: No No Abnormalities Noted: No Dry / Scaly: Yes Maceration: No Treatment Notes Wound #3 (Foot) Wound Laterality: Dorsal, Left, Proximal Cleanser Soap and Water Discharge Instruction: May shower and wash wound with dial antibacterial soap and water prior to dressing change. CHARLOTT, PFLANZ R ([id number]) [id number] Page 8 of 11 Vashe 5.8 (oz) Discharge Instruction: Cleanse the wound with Vashe prior to applying a clean dressing using gauze sponges, not tissue or cotton balls. Peri-Wound Care Triamcinolone 15 (g) Discharge Instruction: Use triamcinolone ****liberally*** Sween Lotion (Moisturizing lotion) Discharge Instruction: Apply moisturizing lotion as directed Topical Triamcinolone Discharge Instruction: Apply Triamcinolone as directed liberallly Primary Dressing Hydrofera Blue Ready Transfer Foam, 2.5x2.5 (in/in) Discharge Instruction: Apply directly to wound bed as directed Santyl Ointment Discharge Instruction: Apply nickel thick amount to wound bed as instructed Secondary Dressing ABD Pad, 8x10 Discharge Instruction: Apply over primary dressing as directed. Woven Gauze Sponge, Non-Sterile 4x4 in Discharge Instruction: Apply over primary dressing as directed. Secured  With Compression Wrap Kerlix Roll 4.5x3.1 (in/yd) Discharge Instruction: Apply Kerlix and Coban compression as directed. Coban Self-Adherent Wrap 4x5 (in/yd) Discharge Instruction: Apply  Volume: (cm) 0.393 % Reduction in Area: 33.3% % Reduction in Volume: 33.3% Epithelialization: Small (1-33%) Tunneling: No Undermining: No Wound Description Classification: Full Thickness Without Exposed Support Structures Wound Margin: Distinct, outline attached Exudate Amount: Medium Exudate Type: Serosanguineous Exudate Color: red, brown Foul Odor After Cleansing: No Slough/Fibrino No Wound Bed Granulation Amount: Medium (34-66%) Exposed Structure Granulation Quality: Red, Pink Fascia Exposed: No Necrotic Amount: Medium (34-66%) Fat Layer (Subcutaneous Tissue) Exposed: No Necrotic Quality: Adherent Slough Tendon Exposed: No Muscle Exposed: No Joint Exposed: No Bone Exposed: No Periwound Skin Texture Texture Color No Abnormalities Noted: No No Abnormalities Noted: No Callus: No Atrophie Blanche: No Crepitus: No Cyanosis: No Excoriation: No Ecchymosis: No Induration: No Erythema: No Rash: No Hemosiderin Staining: No Scarring:  No Mottled: No Pallor: No Moisture Rubor: No No Abnormalities Noted: No Dry / Scaly: Yes Maceration: No Treatment Notes Wound #5 (Calcaneus) Wound Laterality: Right, Lateral Cleanser LISSET, PACKARD R ([id number]) [phone].pdf Page 11 of 11 Soap and Water Discharge Instruction: May shower and wash wound with dial antibacterial soap and water prior to dressing change. Vashe 5.8 (oz) Discharge Instruction: Cleanse the wound with Vashe prior to applying a clean dressing using gauze sponges, not tissue or cotton balls. Peri-Wound Care Triamcinolone 15 (g) Discharge Instruction: Use triamcinolone ****liberally*** Sween Lotion (Moisturizing lotion) Discharge Instruction: Apply moisturizing lotion as directed Topical Triamcinolone Discharge Instruction: Apply Triamcinolone as directed liberallly Primary Dressing Hydrofera Blue Ready Transfer Foam, 2.5x2.5 (in/in) Discharge Instruction: Apply directly to wound bed as directed Santyl Ointment Discharge Instruction: Apply nickel thick amount to wound bed as instructed Secondary Dressing ABD Pad, 8x10 Discharge Instruction: Apply over primary dressing as directed. Woven Gauze Sponge, Non-Sterile 4x4 in Discharge Instruction: Apply over primary dressing as directed. Secured With Compression Wrap Kerlix Roll 4.5x3.1 (in/yd) Discharge Instruction: Apply Kerlix and Coban compression as directed. Coban Self-Adherent Wrap 4x5 (in/yd) Discharge Instruction: Apply over Kerlix as directed. Compression Stockings Add-Ons Electronic Signature(s) Signed: 04/01/2023 1:43:41 PM By: Karie Schwalbe RN Entered By: Karie Schwalbe on 04/01/2023 11:48:22 -------------------------------------------------------------------------------- Vitals Details Patient Name: Date of Service: [name], [name] 04/01/2023 10:30 A M Medical Record Number: [id number] Patient Account Number: [id number] Date of Birth/Sex: Treating  RN: October 20, 1985 (37 y.o. Katrinka Blazing Primary Care Jackeline Gutknecht: Sanda Linger Other Clinician: Referring Eliaz Fout: Treating Arantxa Piercey/Extender: Madelyn Flavors in Treatment: 1 Vital Signs Time Taken: 10:36 Respiratory Rate (breaths/min): 18 Height (in): 69 Reference Range: 80 - 120 mg / dl Weight (lbs): 010 Body Mass Index (BMI): 40.2 Electronic Signature(s) Signed: 04/01/2023 1:43:41 PM By: Karie Schwalbe RN Entered By: Karie Schwalbe on 04/01/2023 11:39:28  over Kerlix as directed. Compression Stockings Add-Ons Electronic Signature(s) Signed: 04/01/2023 1:43:41 PM By: Karie Schwalbe RN Entered By: Karie Schwalbe on 04/01/2023 11:47:44 -------------------------------------------------------------------------------- Wound Assessment Details Patient Name: Date of Service: [name], [name] 04/01/2023 10:30 A M Medical Record Number: [id number] Patient Account Number: [id number] Date of Birth/Sex: Treating RN: [birth year] (37 y.o. Katrinka Blazing Primary Care Remie Mathison: Sanda Linger Other Clinician: Referring Tab Rylee: Treating Senaida Chilcote/Extender: Madelyn Flavors in Treatment: 1 Wound Status Wound Number: 4 Primary Etiology: Lymphedema Wound Location: Right, Dorsal Foot Wound Status: Open Wounding Event: Gradually Appeared Comorbid History: Anemia, Sleep Apnea, Vasculitis, Neuropathy Date Acquired: 03/09/2023 Weeks Of Treatment: 1 Clustered Wound: Yes Wound Measurements Length: (cm) 5.5 Width: (cm) 8 Depth: (cm) 0.1 Clustered Quantity: 4 Area: (cm) 34.558 Volume: (cm) 3.456 Bunn, Laytoya R ([id number]) % Reduction in Area: 8.3% % Reduction in Volume: 8.3% Epithelialization: None Tunneling: No Undermining: No [id number] Page 9 of 11 Wound Description Classification: Full Thickness Without Exposed Support Structures Wound Margin: Distinct, outline attached Exudate Amount: Medium Exudate Type: Serosanguineous Exudate Color: red, brown Foul Odor After Cleansing: No Slough/Fibrino Yes Wound Bed Granulation Amount: Medium (34-66%) Exposed Structure Granulation Quality: Red, Pink Fascia Exposed: No Necrotic Amount: Medium (34-66%) Fat Layer (Subcutaneous Tissue) Exposed: No Necrotic  Quality: Eschar, Adherent Slough Tendon Exposed: No Muscle Exposed: No Joint Exposed: No Bone Exposed: No Periwound Skin Texture Texture Color No Abnormalities Noted: No No Abnormalities Noted: No Callus: No Atrophie Blanche: No Crepitus: No Cyanosis: No Excoriation: No Ecchymosis: No Induration: No Erythema: No Rash: No Hemosiderin Staining: No Scarring: No Mottled: No Pallor: No Moisture Rubor: No No Abnormalities Noted: No Dry / Scaly: Yes Maceration: No Treatment Notes Wound #4 (Foot) Wound Laterality: Dorsal, Right Cleanser Soap and Water Discharge Instruction: May shower and wash wound with dial antibacterial soap and water prior to dressing change. Vashe 5.8 (oz) Discharge Instruction: Cleanse the wound with Vashe prior to applying a clean dressing using gauze sponges, not tissue or cotton balls. Peri-Wound Care Triamcinolone 15 (g) Discharge Instruction: Use triamcinolone ****liberally*** Sween Lotion (Moisturizing lotion) Discharge Instruction: Apply moisturizing lotion as directed Topical Triamcinolone Discharge Instruction: Apply Triamcinolone as directed liberallly Primary Dressing Hydrofera Blue Ready Transfer Foam, 2.5x2.5 (in/in) Discharge Instruction: Apply directly to wound bed as directed Santyl Ointment Discharge Instruction: Apply nickel thick amount to wound bed as instructed Secondary Dressing ABD Pad, 8x10 Discharge Instruction: Apply over primary dressing as directed. Woven Gauze Sponge, Non-Sterile 4x4 in Discharge Instruction: Apply over primary dressing as directed. Secured With Compression Wrap Kerlix Roll 4.5x3.1 (in/yd) Discharge Instruction: Apply Kerlix and Coban compression as directed. Coban Self-Adherent Wrap 4x5 (in/yd) Discharge Instruction: Apply over Kerlix as directed. Compression Stockings CARDELIA, ROCHLIN R ([id number]) [id number] Page 10 of 11 Add-Ons Electronic Signature(s) Signed:  04/01/2023 1:43:41 PM By: Karie Schwalbe RN Entered By: Karie Schwalbe on 04/01/2023 11:48:05 -------------------------------------------------------------------------------- Wound Assessment Details Patient Name: Date of Service: [name], [name] 04/01/2023 10:30 A M Medical Record Number: [id number] Patient Account Number: [id number] Date of Birth/Sex: Treating RN: Aug 31, 1985 (37 y.o. Katrinka Blazing Primary Care Chaela Branscum: Sanda Linger Other Clinician: Referring Inna Tisdell: Treating Kennede Lusk/Extender: Madelyn Flavors in Treatment: 1 Wound Status Wound Number: 5 Primary Etiology: Lymphedema Wound Location: Right, Lateral Calcaneus Wound Status: Open Wounding Event: Gradually Appeared Comorbid History: Anemia, Sleep Apnea, Vasculitis, Neuropathy Date Acquired: 03/09/2023 Weeks Of Treatment: 1 Clustered Wound: No Wound Measurements Length: (cm) 2 Width: (cm) 2.5 Depth: (cm) 0.1 Area: (cm) 3.927  BOSTON, SCHIED R ([id number]) [id number] Page 1 of 11 Visit Report for 04/01/2023 Arrival Information Details Patient Name: Date of Service: GA MMA [name] NDRA R. 04/01/2023 10:30 A M Medical Record Number: [id number] Patient Account Number: [id number] Date of Birth/Sex: Treating RN: [birth year] (37 y.o. Katrinka Blazing Primary Care Kadisha Goodine: Sanda Linger Other Clinician: Referring Jomo Forand: Treating Aamilah Augenstein/Extender: Madelyn Flavors in Treatment: 1 Visit Information History Since Last Visit Added or deleted any medications: No Patient Arrived: Ambulatory Any new allergies or adverse reactions: No Arrival Time: 10:35 Had a fall or experienced change in No Accompanied By: friend activities of daily living that may affect Transfer Assistance: None risk of falls: Patient Identification Verified: Yes Signs or symptoms of abuse/neglect since last visito No Patient Requires Transmission-Based Precautions: No Hospitalized since last visit: No Patient Has Alerts: No Implantable device outside of the clinic excluding No cellular tissue based products placed in the center since last visit: Has Dressing in Place as Prescribed: Yes Pain Present Now: No Electronic Signature(s) Signed: 04/01/2023 4:09:20 PM By: Thayer Dallas Entered By: Thayer Dallas on 04/01/2023 11:42:40 -------------------------------------------------------------------------------- Clinic Level of Care Assessment Details Patient Name: Date of Service: GA MMA [name] NDRA R. 04/01/2023 10:30 A M Medical Record Number: [id number] Patient Account Number: [id number] Date of Birth/Sex: Treating RN: March 29, 1986 (37 y.o. Katrinka Blazing Primary Care Jahmar Mckelvy: Sanda Linger Other Clinician: Referring Jaleeyah Munce: Treating Kendle Turbin/Extender: Madelyn Flavors in Treatment: 1 Clinic Level of Care Assessment Items TOOL 4 Quantity Score X- 1 0 Use when only an  EandM is performed on FOLLOW-UP visit ASSESSMENTS - Nursing Assessment / Reassessment X- 1 10 Reassessment of Co-morbidities (includes updates in patient status) X- 1 5 Reassessment of Adherence to Treatment Plan ASSESSMENTS - Wound and Skin A ssessment / Reassessment []  - 0 Simple Wound Assessment / Reassessment - one wound []  - 0 Complex Wound Assessment / Reassessment - multiple wounds []  - 0 Dermatologic / Skin Assessment (not related to wound area) ASSESSMENTS - Focused Assessment []  - 0 Circumferential Edema Measurements - multi extremities []  - 0 Nutritional Assessment / Counseling / Intervention LOLETHA, TEATE R ([id number]) [id number].pdf Page 2 of 11 []  - 0 Lower Extremity Assessment (monofilament, tuning fork, pulses) []  - 0 Peripheral Arterial Disease Assessment (using hand held doppler) ASSESSMENTS - Ostomy and/or Continence Assessment and Care []  - 0 Incontinence Assessment and Management []  - 0 Ostomy Care Assessment and Management (repouching, etc.) PROCESS - Coordination of Care X - Simple Patient / Family Education for ongoing care 1 15 []  - 0 Complex (extensive) Patient / Family Education for ongoing care X- 1 10 Staff obtains Chiropractor, Records, T Results / Process Orders est X- 1 10 Staff telephones HHA, Nursing Homes / Clarify orders / etc []  - 0 Routine Transfer to another Facility (non-emergent condition) []  - 0 Routine Hospital Admission (non-emergent condition) []  - 0 New Admissions / Manufacturing engineer / Ordering NPWT Apligraf, etc. , []  - 0 Emergency Hospital Admission (emergent condition) []  - 0 Simple Discharge Coordination X- 1 15 Complex (extensive) Discharge Coordination PROCESS - Special Needs []  - 0 Pediatric / Minor Patient Management []  - 0 Isolation Patient Management []  - 0 Hearing / Language / Visual special needs []  - 0 Assessment of Community assistance (transportation, D/C planning,  etc.) []  - 0 Additional assistance / Altered mentation []  - 0 Support Surface(s) Assessment (bed, cushion, seat, etc.) INTERVENTIONS - Wound Cleansing / Measurement []  - 0 Simple Wound Cleansing -

## 2023-04-02 DIAGNOSIS — F339 Major depressive disorder, recurrent, unspecified: Secondary | ICD-10-CM | POA: Diagnosis not present

## 2023-04-02 DIAGNOSIS — F411 Generalized anxiety disorder: Secondary | ICD-10-CM | POA: Diagnosis not present

## 2023-04-02 DIAGNOSIS — E669 Obesity, unspecified: Secondary | ICD-10-CM | POA: Diagnosis not present

## 2023-04-02 DIAGNOSIS — G4700 Insomnia, unspecified: Secondary | ICD-10-CM | POA: Diagnosis not present

## 2023-04-02 NOTE — Progress Notes (Signed)
CHELCEA, ZAHN R ([id number]) [id number] Page 1 of 1 Visit Report for 04/01/2023 SuperBill Details Patient Name: [name], [name] Date of Service: 04/01/2023 Medical Record Number: [id number] Patient Account Number: [id number] Date of Birth/Sex: Treating RN: 1986-04-15 (37 y.o. 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

## 2023-04-04 NOTE — Assessment & Plan Note (Signed)
Will start spironolactone.

## 2023-04-05 ENCOUNTER — Encounter (HOSPITAL_BASED_OUTPATIENT_CLINIC_OR_DEPARTMENT_OTHER): Payer: BC Managed Care – PPO | Admitting: Internal Medicine

## 2023-04-05 DIAGNOSIS — S91302A Unspecified open wound, left foot, initial encounter: Secondary | ICD-10-CM | POA: Diagnosis not present

## 2023-04-05 DIAGNOSIS — L97518 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 severity: Secondary | ICD-10-CM | POA: Diagnosis not present

## 2023-04-05 DIAGNOSIS — G8929 Other chronic pain: Secondary | ICD-10-CM | POA: Diagnosis not present

## 2023-04-05 DIAGNOSIS — E669 Obesity, unspecified: Secondary | ICD-10-CM | POA: Diagnosis not present

## 2023-04-05 DIAGNOSIS — L409 Psoriasis, unspecified: Secondary | ICD-10-CM | POA: Diagnosis not present

## 2023-04-05 DIAGNOSIS — Z6841 Body Mass Index (BMI) 40.0 and over, adult: Secondary | ICD-10-CM | POA: Diagnosis not present

## 2023-04-05 DIAGNOSIS — S91301A Unspecified open wound, right foot, initial encounter: Secondary | ICD-10-CM | POA: Diagnosis not present

## 2023-04-05 DIAGNOSIS — I10 Essential (primary) hypertension: Secondary | ICD-10-CM | POA: Diagnosis not present

## 2023-04-05 DIAGNOSIS — I890 Lymphedema, not elsewhere classified: Secondary | ICD-10-CM | POA: Diagnosis not present

## 2023-04-05 DIAGNOSIS — E538 Deficiency of other specified B group vitamins: Secondary | ICD-10-CM | POA: Diagnosis not present

## 2023-04-05 DIAGNOSIS — L97528 Non-pressure chronic ulcer of other part of left foot with other specified severity: Secondary | ICD-10-CM | POA: Diagnosis not present

## 2023-04-05 LAB — HEPATITIS A ANTIBODY, TOTAL: Hepatitis A AB,Total: REACTIVE — AB

## 2023-04-05 LAB — CRYOGLOBULIN SCREEN W/ RFLX: CRYOGLOBULIN SCR W/REFL CRYOGLOBULIN PROFILE, S: NEGATIVE

## 2023-04-06 ENCOUNTER — Other Ambulatory Visit: Payer: BC Managed Care – PPO

## 2023-04-06 DIAGNOSIS — E876 Hypokalemia: Secondary | ICD-10-CM

## 2023-04-06 NOTE — Progress Notes (Signed)
Instruction: Apply nickel thick amount to wound bed as instructed Secondary Dressing ABD Pad, 8x10 Discharge Instruction: Apply over primary dressing as directed. Woven Gauze Sponge, Non-Sterile 4x4 in Discharge Instruction: Apply over primary dressing as directed. Secured With Compression Wrap Kerlix Roll 4.5x3.1 (in/yd) Discharge Instruction: Apply Kerlix and Coban compression as directed. Coban Self-Adherent Wrap 4x5 (in/yd) Discharge Instruction: Apply over Kerlix as directed. Compression Stockings Add-Ons Electronic Signature(s) Signed: 04/05/2023 4:40:44 PM By: Shawn Stall RN, BSN Entered By: Shawn Stall on 04/05/2023 06:15:27 -------------------------------------------------------------------------------- Wound  Assessment Details Patient Name: Date of Service: [name], [name] 04/05/2023 8:45 A M Medical Record Number: [id number] Patient Account Number: [id number] Date of Birth/Sex: Treating RN: [birth year] (37 y.o. [name], Millard.Loa Primary Care Emmanuelle Coxe: Sanda Linger Other Clinician: Referring Lalani Winkles: Treating Jeneane Pieczynski/Extender: Madelyn Flavors in Treatment: 1 Wound Status Wound Number: 4 Primary Etiology: Lymphedema Wound Location: Right, Dorsal Foot Wound Status: Open Wounding Event: Gradually Appeared Comorbid History: Anemia, Sleep Apnea, Vasculitis, Neuropathy Date Acquired: 03/09/2023 Weeks Of Treatment: 1 Clustered Wound: Yes Photos Wound Measurements Length: (cm) 5.9 Width: (cm) 8 Depth: (cm) 0.2 Clustered Quantity: 5 Area: (cm) 37.071 Volume: (cm) 7.414 Bowlby, Burgandy R ([id number]) Wound Description Classification: Full Thickness Without Exposed Support Structures Wound Margin: Distinct, outline attached Exudate Amount: Medium Exudate Type: Serosanguineous Exudate Color: red, brown Foul Odor After Cleansing: No Slough/Fibrino Yes % Reduction in Area: 1.7% % Reduction in Volume: -96.7% Epithelialization: None Tunneling: No Undermining: No [id number] Page 16 of 19 Wound Bed Granulation Amount: Medium (34-66%) Exposed Structure Granulation Quality: Red, Pink Fascia Exposed: No Necrotic Amount: Medium (34-66%) Fat Layer (Subcutaneous Tissue) Exposed: No Necrotic Quality: Eschar, Adherent Slough Tendon Exposed: No Muscle Exposed: No Joint Exposed: No Bone Exposed: No Periwound Skin Texture Texture Color No Abnormalities Noted: No No Abnormalities Noted: No Callus: No Atrophie Blanche: No Crepitus: No Cyanosis: No Excoriation: No Ecchymosis: No Induration: No Erythema: No Rash: No Hemosiderin Staining: No Scarring: No Mottled: No Pallor: No Moisture Rubor: No No Abnormalities Noted: No Dry /  Scaly: Yes Maceration: No Treatment Notes Wound #4 (Foot) Wound Laterality: Dorsal, Right Cleanser Soap and Water Discharge Instruction: May shower and wash wound with dial antibacterial soap and water prior to dressing change. Vashe 5.8 (oz) Discharge Instruction: Cleanse the wound with Vashe prior to applying a clean dressing using gauze sponges, not tissue or cotton balls. Peri-Wound Care Triamcinolone 15 (g) Discharge Instruction: Use triamcinolone ****liberally*** Sween Lotion (Moisturizing lotion) Discharge Instruction: Apply moisturizing lotion as directed Topical Triamcinolone Discharge Instruction: Apply Triamcinolone as directed liberallly Primary Dressing Hydrofera Blue Ready Transfer Foam, 2.5x2.5 (in/in) Discharge Instruction: Apply directly to wound bed as directed Santyl Ointment Discharge Instruction: Apply nickel thick amount to wound bed as instructed Secondary Dressing ABD Pad, 8x10 Discharge Instruction: Apply over primary dressing as directed. Woven Gauze Sponge, Non-Sterile 4x4 in Discharge Instruction: Apply over primary dressing as directed. Secured With Compression Wrap Kerlix Roll 4.5x3.1 (in/yd) Discharge Instruction: Apply Kerlix and Coban compression as directed. Coban Self-Adherent Wrap 4x5 (in/yd) Discharge Instruction: Apply over Kerlix as directed. Compression Stockings YVETTA, DROTAR R ([id number]) [id number] Page 17 of 19 Add-Ons Electronic Signature(s) Signed: 04/05/2023 4:40:44 PM By: Shawn Stall RN, BSN Entered By: Shawn Stall on 04/05/2023 06:15:45 -------------------------------------------------------------------------------- Wound Assessment Details Patient Name: Date of Service: [name], [name] 04/05/2023 8:45 A M Medical Record Number: [id number] Patient Account Number: [id number] Date of Birth/Sex: Treating RN: Feb 20, 1986 (37 y.o. [name] Primary Care Itzamara Casas: Sanda Linger  Hemosiderin Staining: No Scarring: No Mottled: No Pallor: No Moisture Rubor: No No Abnormalities Noted: No Dry / Scaly: Yes Maceration: No Treatment Notes Wound #2 (Foot) Wound Laterality: Left, Distal LYNETT, BRASIL R ([id number]) [id number] Page 13 of 19 Cleanser Soap and Water Discharge Instruction: May shower and wash wound with dial antibacterial soap and water prior to dressing change. Vashe 5.8 (oz) Discharge Instruction: Cleanse the wound with Vashe prior to applying a clean dressing using gauze sponges, not tissue or cotton balls. Peri-Wound Care Triamcinolone 15 (g) Discharge Instruction: Use triamcinolone ****liberally*** Sween Lotion (Moisturizing lotion) Discharge Instruction: Apply moisturizing lotion as directed Topical Triamcinolone Discharge Instruction: Apply Triamcinolone as directed liberallly Primary Dressing Hydrofera Blue Ready Transfer Foam, 2.5x2.5 (in/in) Discharge Instruction: Apply directly to wound bed as directed Santyl Ointment Discharge Instruction: Apply nickel thick amount to wound bed as instructed Secondary Dressing ABD Pad, 8x10 Discharge Instruction: Apply over primary dressing as directed. Woven Gauze Sponge, Non-Sterile 4x4 in Discharge Instruction: Apply over primary dressing as directed. Secured With Compression Wrap Kerlix Roll 4.5x3.1 (in/yd) Discharge Instruction: Apply Kerlix and Coban compression as directed. Coban Self-Adherent Wrap 4x5  (in/yd) Discharge Instruction: Apply over Kerlix as directed. Compression Stockings Add-Ons Electronic Signature(s) Signed: 04/05/2023 4:40:44 PM By: Shawn Stall RN, BSN Entered By: Shawn Stall on 04/05/2023 06:15:01 -------------------------------------------------------------------------------- Wound Assessment Details Patient Name: Date of Service: [name], [name] 04/05/2023 8:45 A M Medical Record Number: [id number] Patient Account Number: [id number] Date of Birth/Sex: Treating RN: May 27, 1986 (37 y.o. [name] Primary Care Yobana Culliton: Sanda Linger Other Clinician: Referring Glenwood Revoir: Treating Paislynn Hegstrom/Extender: Madelyn Flavors in Treatment: 1 Wound Status Wound Number: 3 Primary Etiology: Lymphedema Wound Location: Left, Proximal, Dorsal Foot Wound Status: Open Wounding Event: Gradually Appeared Comorbid History: Anemia, Sleep Apnea, Vasculitis, Neuropathy Date Acquired: 03/09/2023 Weeks Of Treatment: 1 Clustered Wound: Yes Photos MERIT, GADSBY R ([id number]) [id number].pdf Page 14 of 19 Wound Measurements Length: (cm) Width: (cm) Depth: (cm) Clustered Quantity: Area: (cm) Volume: (cm) 8.5 % Reduction in Area: -128.9% 5.6 % Reduction in Volume: -14.4% 0.1 Epithelialization: None 9 Tunneling: No 37.385 Undermining: No 3.738 Wound Description Classification: Unclassifiable Wound Margin: Distinct, outline attached Exudate Amount: Medium Exudate Type: Serosanguineous Exudate Color: red, brown Foul Odor After Cleansing: No Slough/Fibrino Yes Wound Bed Granulation Amount: None Present (0%) Exposed Structure Necrotic Amount: Large (67-100%) Fascia Exposed: No Necrotic Quality: Eschar, Adherent Slough Fat Layer (Subcutaneous Tissue) Exposed: No Tendon Exposed: No Muscle Exposed: No Joint Exposed: No Bone Exposed: No Periwound Skin Texture Texture Color No Abnormalities Noted: No No Abnormalities  Noted: No Callus: No Atrophie Blanche: No Crepitus: No Cyanosis: No Excoriation: No Ecchymosis: No Induration: No Erythema: No Rash: No Hemosiderin Staining: No Scarring: No Mottled: No Pallor: No Moisture Rubor: No No Abnormalities Noted: No Dry / Scaly: Yes Maceration: No Treatment Notes Wound #3 (Foot) Wound Laterality: Dorsal, Left, Proximal Cleanser Soap and Water Discharge Instruction: May shower and wash wound with dial antibacterial soap and water prior to dressing change. Vashe 5.8 (oz) Discharge Instruction: Cleanse the wound with Vashe prior to applying a clean dressing using gauze sponges, not tissue or cotton balls. Peri-Wound Care Triamcinolone 15 (g) Discharge Instruction: Use triamcinolone ****liberally*** Sween Lotion (Moisturizing lotion) Discharge Instruction: Apply moisturizing lotion as directed Topical Triamcinolone Discharge Instruction: Apply Triamcinolone as directed liberallly Primary Dressing [facility] Blue Ready Transfer Foam, 2.5x2.5 (in/in) Petrosian, Cheryln R ([id number]) [phone].pdf Page 15 of 19 Discharge Instruction: Apply directly to wound bed as directed Santyl Ointment Discharge  Hemosiderin Staining: No Scarring: No Mottled: No Pallor: No Moisture Rubor: No No Abnormalities Noted: No Dry / Scaly: Yes Maceration: No Treatment Notes Wound #2 (Foot) Wound Laterality: Left, Distal LYNETT, BRASIL R ([id number]) [id number] Page 13 of 19 Cleanser Soap and Water Discharge Instruction: May shower and wash wound with dial antibacterial soap and water prior to dressing change. Vashe 5.8 (oz) Discharge Instruction: Cleanse the wound with Vashe prior to applying a clean dressing using gauze sponges, not tissue or cotton balls. Peri-Wound Care Triamcinolone 15 (g) Discharge Instruction: Use triamcinolone ****liberally*** Sween Lotion (Moisturizing lotion) Discharge Instruction: Apply moisturizing lotion as directed Topical Triamcinolone Discharge Instruction: Apply Triamcinolone as directed liberallly Primary Dressing Hydrofera Blue Ready Transfer Foam, 2.5x2.5 (in/in) Discharge Instruction: Apply directly to wound bed as directed Santyl Ointment Discharge Instruction: Apply nickel thick amount to wound bed as instructed Secondary Dressing ABD Pad, 8x10 Discharge Instruction: Apply over primary dressing as directed. Woven Gauze Sponge, Non-Sterile 4x4 in Discharge Instruction: Apply over primary dressing as directed. Secured With Compression Wrap Kerlix Roll 4.5x3.1 (in/yd) Discharge Instruction: Apply Kerlix and Coban compression as directed. Coban Self-Adherent Wrap 4x5  (in/yd) Discharge Instruction: Apply over Kerlix as directed. Compression Stockings Add-Ons Electronic Signature(s) Signed: 04/05/2023 4:40:44 PM By: Shawn Stall RN, BSN Entered By: Shawn Stall on 04/05/2023 06:15:01 -------------------------------------------------------------------------------- Wound Assessment Details Patient Name: Date of Service: [name], [name] 04/05/2023 8:45 A M Medical Record Number: [id number] Patient Account Number: [id number] Date of Birth/Sex: Treating RN: May 27, 1986 (37 y.o. [name] Primary Care Yobana Culliton: Sanda Linger Other Clinician: Referring [place]wood Revoir: Treating Paislynn Hegstrom/Extender: Madelyn Flavors in Treatment: 1 Wound Status Wound Number: 3 Primary Etiology: Lymphedema Wound Location: Left, Proximal, Dorsal Foot Wound Status: Open Wounding Event: Gradually Appeared Comorbid History: Anemia, Sleep Apnea, Vasculitis, Neuropathy Date Acquired: 03/09/2023 Weeks Of Treatment: 1 Clustered Wound: Yes Photos MERIT, GADSBY R ([id number]) [phone].pdf Page 14 of 19 Wound Measurements Length: (cm) Width: (cm) Depth: (cm) Clustered Quantity: Area: (cm) Volume: (cm) 8.5 % Reduction in Area: -128.9% 5.6 % Reduction in Volume: -14.4% 0.1 Epithelialization: None 9 Tunneling: No 37.385 Undermining: No 3.738 Wound Description Classification: Unclassifiable Wound Margin: Distinct, outline attached Exudate Amount: Medium Exudate Type: Serosanguineous Exudate Color: red, brown Foul Odor After Cleansing: No Slough/Fibrino Yes Wound Bed Granulation Amount: None Present (0%) Exposed Structure Necrotic Amount: Large (67-100%) Fascia Exposed: No Necrotic Quality: Eschar, Adherent Slough Fat Layer (Subcutaneous Tissue) Exposed: No Tendon Exposed: No Muscle Exposed: No Joint Exposed: No Bone Exposed: No Periwound Skin Texture Texture Color No Abnormalities Noted: No No Abnormalities  Noted: No Callus: No Atrophie Blanche: No Crepitus: No Cyanosis: No Excoriation: No Ecchymosis: No Induration: No Erythema: No Rash: No Hemosiderin Staining: No Scarring: No Mottled: No Pallor: No Moisture Rubor: No No Abnormalities Noted: No Dry / Scaly: Yes Maceration: No Treatment Notes Wound #3 (Foot) Wound Laterality: Dorsal, Left, Proximal Cleanser Soap and Water Discharge Instruction: May shower and wash wound with dial antibacterial soap and water prior to dressing change. Vashe 5.8 (oz) Discharge Instruction: Cleanse the wound with Vashe prior to applying a clean dressing using gauze sponges, not tissue or cotton balls. Peri-Wound Care Triamcinolone 15 (g) Discharge Instruction: Use triamcinolone ****liberally*** Sween Lotion (Moisturizing lotion) Discharge Instruction: Apply moisturizing lotion as directed Topical Triamcinolone Discharge Instruction: Apply Triamcinolone as directed liberallly Primary Dressing [facility] Blue Ready Transfer Foam, 2.5x2.5 (in/in) Petrosian, Cheryln R ([id number]) [phone].pdf Page 15 of 19 Discharge Instruction: Apply directly to wound bed as directed Santyl Ointment Discharge  cm) Width: (cm) Depth: (cm) Clustered Quantity: Area: (cm) Volume: (cm) 7 % Reduction in Area: 92.3% 1 % Reduction in Volume: 92.3% 0.1 Epithelialization: None 4 Tunneling: No 5.498 Undermining:  No 0.55 Wound Description Classification: Unclassifiable Wound Margin: Distinct, outline attached Exudate Amount: Medium Exudate Type: Serosanguineous Exudate Color: red, brown Foul Odor After Cleansing: No Slough/Fibrino Yes Wound Bed Granulation Amount: None Present (0%) Exposed Structure Necrotic Amount: Large (67-100%) Fascia Exposed: No Necrotic Quality: Eschar, Adherent Slough Fat Layer (Subcutaneous Tissue) Exposed: No Tendon Exposed: No Muscle Exposed: No Mancuso, Jamaia R ([id number]) [id number] Page 11 of 19 Joint Exposed: No Bone Exposed: No Periwound Skin Texture Texture Color No Abnormalities Noted: No No Abnormalities Noted: No Callus: No Atrophie Blanche: No Crepitus: No Cyanosis: No Excoriation: No Ecchymosis: No Induration: No Erythema: No Rash: No Hemosiderin Staining: No Scarring: No Mottled: No Pallor: No Moisture Rubor: No No Abnormalities Noted: No Dry / Scaly: Yes Temperature / Pain Maceration: No Temperature: No Abnormality Treatment Notes Wound #1 (Foot) Wound Laterality: Left, Medial Cleanser Soap and Water Discharge Instruction: May shower and wash wound with dial antibacterial soap and water prior to dressing change. Vashe 5.8 (oz) Discharge Instruction: Cleanse the wound with Vashe prior to applying a clean dressing using gauze sponges, not tissue or cotton balls. Peri-Wound Care Triamcinolone 15 (g) Discharge Instruction: Use triamcinolone ****liberally*** Sween Lotion (Moisturizing lotion) Discharge Instruction: Apply moisturizing lotion as directed Topical Triamcinolone Discharge Instruction: Apply Triamcinolone as directed liberallly Primary Dressing Hydrofera Blue Ready Transfer Foam, 2.5x2.5 (in/in) Discharge Instruction: Apply directly to wound bed as directed Santyl Ointment Discharge Instruction: Apply nickel thick amount to wound bed as instructed Secondary Dressing ABD Pad,  8x10 Discharge Instruction: Apply over primary dressing as directed. Woven Gauze Sponge, Non-Sterile 4x4 in Discharge Instruction: Apply over primary dressing as directed. Secured With Compression Wrap Kerlix Roll 4.5x3.1 (in/yd) Discharge Instruction: Apply Kerlix and Coban compression as directed. Coban Self-Adherent Wrap 4x5 (in/yd) Discharge Instruction: Apply over Kerlix as directed. Compression Stockings Add-Ons Electronic Signature(s) Signed: 04/05/2023 4:40:44 PM By: Shawn Stall RN, BSN Entered By: Shawn Stall on 04/05/2023 06:14:41 Markus Jarvis R ([id number]) [id number] Page 12 of 19 -------------------------------------------------------------------------------- Wound Assessment Details Patient Name: Date of Service: GA MMA [name] NDRA R. 04/05/2023 8:45 A M Medical Record Number: [id number] Patient Account Number: [id number] Date of Birth/Sex: Treating RN: [birth year] (37 y.o. [name], Millard.Loa Primary Care Merrilyn Legler: Sanda Linger Other Clinician: Referring Arryana Tolleson: Treating Kacee Koren/Extender: Madelyn Flavors in Treatment: 1 Wound Status Wound Number: 2 Primary Etiology: Lymphedema Wound Location: Left, Distal Foot Wound Status: Open Wounding Event: Gradually Appeared Comorbid History: Anemia, Sleep Apnea, Vasculitis, Neuropathy Date Acquired: 03/09/2023 Weeks Of Treatment: 1 Clustered Wound: Yes Photos Wound Measurements Length: (cm) Width: (cm) Depth: (cm) Clustered Quantity: Area: (cm) Volume: (cm) 3 % Reduction in Area: 70% 3 % Reduction in Volume: 70% 0.1 Epithelialization: Small (1-33%) 1 Tunneling: No 7.069 Undermining: No 0.707 Wound Description Classification: Unclassifiable Wound Margin: Distinct, outline attached Exudate Amount: Medium Exudate Type: Serosanguineous Exudate Color: red, brown Foul Odor After Cleansing: No Slough/Fibrino Yes Wound Bed Granulation Amount: None Present (0%)  Exposed Structure Necrotic Amount: Large (67-100%) Fascia Exposed: No Necrotic Quality: Eschar, Adherent Slough Fat Layer (Subcutaneous Tissue) Exposed: No Tendon Exposed: No Muscle Exposed: No Joint Exposed: No Bone Exposed: No Periwound Skin Texture Texture Color No Abnormalities Noted: No No Abnormalities Noted: No Callus: No Atrophie Blanche: No Crepitus: No Cyanosis: No Excoriation: No Ecchymosis: No Induration: No Erythema: No Rash: No  cm) Width: (cm) Depth: (cm) Clustered Quantity: Area: (cm) Volume: (cm) 7 % Reduction in Area: 92.3% 1 % Reduction in Volume: 92.3% 0.1 Epithelialization: None 4 Tunneling: No 5.498 Undermining:  No 0.55 Wound Description Classification: Unclassifiable Wound Margin: Distinct, outline attached Exudate Amount: Medium Exudate Type: Serosanguineous Exudate Color: red, brown Foul Odor After Cleansing: No Slough/Fibrino Yes Wound Bed Granulation Amount: None Present (0%) Exposed Structure Necrotic Amount: Large (67-100%) Fascia Exposed: No Necrotic Quality: Eschar, Adherent Slough Fat Layer (Subcutaneous Tissue) Exposed: No Tendon Exposed: No Muscle Exposed: No Mancuso, Jamaia R ([id number]) [id number] Page 11 of 19 Joint Exposed: No Bone Exposed: No Periwound Skin Texture Texture Color No Abnormalities Noted: No No Abnormalities Noted: No Callus: No Atrophie Blanche: No Crepitus: No Cyanosis: No Excoriation: No Ecchymosis: No Induration: No Erythema: No Rash: No Hemosiderin Staining: No Scarring: No Mottled: No Pallor: No Moisture Rubor: No No Abnormalities Noted: No Dry / Scaly: Yes Temperature / Pain Maceration: No Temperature: No Abnormality Treatment Notes Wound #1 (Foot) Wound Laterality: Left, Medial Cleanser Soap and Water Discharge Instruction: May shower and wash wound with dial antibacterial soap and water prior to dressing change. Vashe 5.8 (oz) Discharge Instruction: Cleanse the wound with Vashe prior to applying a clean dressing using gauze sponges, not tissue or cotton balls. Peri-Wound Care Triamcinolone 15 (g) Discharge Instruction: Use triamcinolone ****liberally*** Sween Lotion (Moisturizing lotion) Discharge Instruction: Apply moisturizing lotion as directed Topical Triamcinolone Discharge Instruction: Apply Triamcinolone as directed liberallly Primary Dressing Hydrofera Blue Ready Transfer Foam, 2.5x2.5 (in/in) Discharge Instruction: Apply directly to wound bed as directed Santyl Ointment Discharge Instruction: Apply nickel thick amount to wound bed as instructed Secondary Dressing ABD Pad,  8x10 Discharge Instruction: Apply over primary dressing as directed. Woven Gauze Sponge, Non-Sterile 4x4 in Discharge Instruction: Apply over primary dressing as directed. Secured With Compression Wrap Kerlix Roll 4.5x3.1 (in/yd) Discharge Instruction: Apply Kerlix and Coban compression as directed. Coban Self-Adherent Wrap 4x5 (in/yd) Discharge Instruction: Apply over Kerlix as directed. Compression Stockings Add-Ons Electronic Signature(s) Signed: 04/05/2023 4:40:44 PM By: Shawn Stall RN, BSN Entered By: Shawn Stall on 04/05/2023 06:14:41 Markus Jarvis R ([id number]) [id number] Page 12 of 19 -------------------------------------------------------------------------------- Wound Assessment Details Patient Name: Date of Service: GA MMA [name] NDRA R. 04/05/2023 8:45 A M Medical Record Number: [id number] Patient Account Number: [id number] Date of Birth/Sex: Treating RN: [birth year] (37 y.o. [name], Millard.Loa Primary Care Merrilyn Legler: Sanda Linger Other Clinician: Referring Arryana Tolleson: Treating Kacee Koren/Extender: Madelyn Flavors in Treatment: 1 Wound Status Wound Number: 2 Primary Etiology: Lymphedema Wound Location: Left, Distal Foot Wound Status: Open Wounding Event: Gradually Appeared Comorbid History: Anemia, Sleep Apnea, Vasculitis, Neuropathy Date Acquired: 03/09/2023 Weeks Of Treatment: 1 Clustered Wound: Yes Photos Wound Measurements Length: (cm) Width: (cm) Depth: (cm) Clustered Quantity: Area: (cm) Volume: (cm) 3 % Reduction in Area: 70% 3 % Reduction in Volume: 70% 0.1 Epithelialization: Small (1-33%) 1 Tunneling: No 7.069 Undermining: No 0.707 Wound Description Classification: Unclassifiable Wound Margin: Distinct, outline attached Exudate Amount: Medium Exudate Type: Serosanguineous Exudate Color: red, brown Foul Odor After Cleansing: No Slough/Fibrino Yes Wound Bed Granulation Amount: None Present (0%)  Exposed Structure Necrotic Amount: Large (67-100%) Fascia Exposed: No Necrotic Quality: Eschar, Adherent Slough Fat Layer (Subcutaneous Tissue) Exposed: No Tendon Exposed: No Muscle Exposed: No Joint Exposed: No Bone Exposed: No Periwound Skin Texture Texture Color No Abnormalities Noted: No No Abnormalities Noted: No Callus: No Atrophie Blanche: No Crepitus: No Cyanosis: No Excoriation: No Ecchymosis: No Induration: No Erythema: No Rash: No  Other  Clinician: Referring Edinson Domeier: Treating Clarence Cogswell/Extender: Madelyn Flavors in Treatment: 1 Wound Status Wound Number: 5 Primary Etiology: Lymphedema Wound Location: Right, Lateral Calcaneus Wound Status: Open Wounding Event: Gradually Appeared Comorbid History: Anemia, Sleep Apnea, Vasculitis, Neuropathy Date Acquired: 03/09/2023 Weeks Of Treatment: 1 Clustered Wound: No Photos Wound Measurements Length: (cm) 2 Width: (cm) 2.9 Depth: (cm) 0.1 Area: (cm) 4.555 Volume: (cm) 0.456 % Reduction in Area: 22.7% % Reduction in Volume: 22.6% Epithelialization: Small (1-33%) Tunneling: No Undermining: No Wound Description Classification: Full Thickness Without Exposed Support Structures Wound Margin: Distinct, outline attached Exudate Amount: Medium Exudate Type: Serosanguineous Exudate Color: red, brown Foul Odor After Cleansing: No Slough/Fibrino No Wound Bed Granulation Amount: Medium (34-66%) Exposed Structure Granulation Quality: Red, Pink Fascia Exposed: No Necrotic Amount: Medium (34-66%) Fat Layer (Subcutaneous Tissue) Exposed: No Necrotic Quality: Adherent Slough Tendon Exposed: No Muscle Exposed: No Joint Exposed: No Bone Exposed: No Periwound Skin Texture Texture Color No Abnormalities Noted: No No Abnormalities Noted: No Callus: No Atrophie Blanche: No Crepitus: No Cyanosis: No Degregorio, Aerin R ([id number]) [id number] Page 18 of 19 Excoriation: No Ecchymosis: No Induration: No Erythema: No Rash: No Hemosiderin Staining: No Scarring: No Mottled: No Pallor: No Moisture Rubor: No No Abnormalities Noted: No Dry / Scaly: Yes Maceration: No Treatment Notes Wound #5 (Calcaneus) Wound Laterality: Right, Lateral Cleanser Soap and Water Discharge Instruction: May shower and wash wound with dial antibacterial soap and water prior to dressing change. Vashe 5.8 (oz) Discharge Instruction: Cleanse the wound  with Vashe prior to applying a clean dressing using gauze sponges, not tissue or cotton balls. Peri-Wound Care Triamcinolone 15 (g) Discharge Instruction: Use triamcinolone ****liberally*** Sween Lotion (Moisturizing lotion) Discharge Instruction: Apply moisturizing lotion as directed Topical Triamcinolone Discharge Instruction: Apply Triamcinolone as directed liberallly Primary Dressing Hydrofera Blue Ready Transfer Foam, 2.5x2.5 (in/in) Discharge Instruction: Apply directly to wound bed as directed Santyl Ointment Discharge Instruction: Apply nickel thick amount to wound bed as instructed Secondary Dressing ABD Pad, 8x10 Discharge Instruction: Apply over primary dressing as directed. Woven Gauze Sponge, Non-Sterile 4x4 in Discharge Instruction: Apply over primary dressing as directed. Secured With Compression Wrap Kerlix Roll 4.5x3.1 (in/yd) Discharge Instruction: Apply Kerlix and Coban compression as directed. Coban Self-Adherent Wrap 4x5 (in/yd) Discharge Instruction: Apply over Kerlix as directed. Compression Stockings Add-Ons Electronic Signature(s) Signed: 04/05/2023 4:40:44 PM By: Shawn Stall RN, BSN Entered By: Shawn Stall on 04/05/2023 06:16:06 -------------------------------------------------------------------------------- Vitals Details Patient Name: Date of Service: [name], [name] 04/05/2023 8:45 A M Medical Record Number: [id number] Patient Account Number: [id number] Date of Birth/Sex: Treating RN: 1985-07-18 (37 y.o. [name] Primary Care Svetlana Bagby: Sanda Linger Other Clinician: Referring Desare Duddy: Treating Lurlene Ronda/Extender: Madelyn Flavors in Treatment: 1 Conrad, Lenord Carbo ([id number]) [id number] Page 19 of 19 Vital Signs Time Taken: 09:05 Temperature (F): 98.9 Height (in): 69 Pulse (bpm): 87 Weight (lbs): 272 Respiratory Rate (breaths/min): 20 Body Mass Index (BMI): 40.2 Blood Pressure  (mmHg): 146/96 Reference Range: 80 - 120 mg / dl Electronic Signature(s) Signed: 04/05/2023 4:40:44 PM By: Shawn Stall RN, BSN Entered By: Shawn Stall on 04/05/2023 06:05:14  cm) Width: (cm) Depth: (cm) Clustered Quantity: Area: (cm) Volume: (cm) 7 % Reduction in Area: 92.3% 1 % Reduction in Volume: 92.3% 0.1 Epithelialization: None 4 Tunneling: No 5.498 Undermining:  No 0.55 Wound Description Classification: Unclassifiable Wound Margin: Distinct, outline attached Exudate Amount: Medium Exudate Type: Serosanguineous Exudate Color: red, brown Foul Odor After Cleansing: No Slough/Fibrino Yes Wound Bed Granulation Amount: None Present (0%) Exposed Structure Necrotic Amount: Large (67-100%) Fascia Exposed: No Necrotic Quality: Eschar, Adherent Slough Fat Layer (Subcutaneous Tissue) Exposed: No Tendon Exposed: No Muscle Exposed: No Mancuso, Jamaia R ([id number]) [id number] Page 11 of 19 Joint Exposed: No Bone Exposed: No Periwound Skin Texture Texture Color No Abnormalities Noted: No No Abnormalities Noted: No Callus: No Atrophie Blanche: No Crepitus: No Cyanosis: No Excoriation: No Ecchymosis: No Induration: No Erythema: No Rash: No Hemosiderin Staining: No Scarring: No Mottled: No Pallor: No Moisture Rubor: No No Abnormalities Noted: No Dry / Scaly: Yes Temperature / Pain Maceration: No Temperature: No Abnormality Treatment Notes Wound #1 (Foot) Wound Laterality: Left, Medial Cleanser Soap and Water Discharge Instruction: May shower and wash wound with dial antibacterial soap and water prior to dressing change. Vashe 5.8 (oz) Discharge Instruction: Cleanse the wound with Vashe prior to applying a clean dressing using gauze sponges, not tissue or cotton balls. Peri-Wound Care Triamcinolone 15 (g) Discharge Instruction: Use triamcinolone ****liberally*** Sween Lotion (Moisturizing lotion) Discharge Instruction: Apply moisturizing lotion as directed Topical Triamcinolone Discharge Instruction: Apply Triamcinolone as directed liberallly Primary Dressing Hydrofera Blue Ready Transfer Foam, 2.5x2.5 (in/in) Discharge Instruction: Apply directly to wound bed as directed Santyl Ointment Discharge Instruction: Apply nickel thick amount to wound bed as instructed Secondary Dressing ABD Pad,  8x10 Discharge Instruction: Apply over primary dressing as directed. Woven Gauze Sponge, Non-Sterile 4x4 in Discharge Instruction: Apply over primary dressing as directed. Secured With Compression Wrap Kerlix Roll 4.5x3.1 (in/yd) Discharge Instruction: Apply Kerlix and Coban compression as directed. Coban Self-Adherent Wrap 4x5 (in/yd) Discharge Instruction: Apply over Kerlix as directed. Compression Stockings Add-Ons Electronic Signature(s) Signed: 04/05/2023 4:40:44 PM By: Shawn Stall RN, BSN Entered By: Shawn Stall on 04/05/2023 06:14:41 Markus Jarvis R ([id number]) [id number] Page 12 of 19 -------------------------------------------------------------------------------- Wound Assessment Details Patient Name: Date of Service: GA MMA [name] NDRA R. 04/05/2023 8:45 A M Medical Record Number: [id number] Patient Account Number: [id number] Date of Birth/Sex: Treating RN: [birth year] (37 y.o. [name], Millard.Loa Primary Care Merrilyn Legler: Sanda Linger Other Clinician: Referring Arryana Tolleson: Treating Kacee Koren/Extender: Madelyn Flavors in Treatment: 1 Wound Status Wound Number: 2 Primary Etiology: Lymphedema Wound Location: Left, Distal Foot Wound Status: Open Wounding Event: Gradually Appeared Comorbid History: Anemia, Sleep Apnea, Vasculitis, Neuropathy Date Acquired: 03/09/2023 Weeks Of Treatment: 1 Clustered Wound: Yes Photos Wound Measurements Length: (cm) Width: (cm) Depth: (cm) Clustered Quantity: Area: (cm) Volume: (cm) 3 % Reduction in Area: 70% 3 % Reduction in Volume: 70% 0.1 Epithelialization: Small (1-33%) 1 Tunneling: No 7.069 Undermining: No 0.707 Wound Description Classification: Unclassifiable Wound Margin: Distinct, outline attached Exudate Amount: Medium Exudate Type: Serosanguineous Exudate Color: red, brown Foul Odor After Cleansing: No Slough/Fibrino Yes Wound Bed Granulation Amount: None Present (0%)  Exposed Structure Necrotic Amount: Large (67-100%) Fascia Exposed: No Necrotic Quality: Eschar, Adherent Slough Fat Layer (Subcutaneous Tissue) Exposed: No Tendon Exposed: No Muscle Exposed: No Joint Exposed: No Bone Exposed: No Periwound Skin Texture Texture Color No Abnormalities Noted: No No Abnormalities Noted: No Callus: No Atrophie Blanche: No Crepitus: No Cyanosis: No Excoriation: No Ecchymosis: No Induration: No Erythema: No Rash: No  Other  Clinician: Referring Edinson Domeier: Treating Clarence Cogswell/Extender: Madelyn Flavors in Treatment: 1 Wound Status Wound Number: 5 Primary Etiology: Lymphedema Wound Location: Right, Lateral Calcaneus Wound Status: Open Wounding Event: Gradually Appeared Comorbid History: Anemia, Sleep Apnea, Vasculitis, Neuropathy Date Acquired: 03/09/2023 Weeks Of Treatment: 1 Clustered Wound: No Photos Wound Measurements Length: (cm) 2 Width: (cm) 2.9 Depth: (cm) 0.1 Area: (cm) 4.555 Volume: (cm) 0.456 % Reduction in Area: 22.7% % Reduction in Volume: 22.6% Epithelialization: Small (1-33%) Tunneling: No Undermining: No Wound Description Classification: Full Thickness Without Exposed Support Structures Wound Margin: Distinct, outline attached Exudate Amount: Medium Exudate Type: Serosanguineous Exudate Color: red, brown Foul Odor After Cleansing: No Slough/Fibrino No Wound Bed Granulation Amount: Medium (34-66%) Exposed Structure Granulation Quality: Red, Pink Fascia Exposed: No Necrotic Amount: Medium (34-66%) Fat Layer (Subcutaneous Tissue) Exposed: No Necrotic Quality: Adherent Slough Tendon Exposed: No Muscle Exposed: No Joint Exposed: No Bone Exposed: No Periwound Skin Texture Texture Color No Abnormalities Noted: No No Abnormalities Noted: No Callus: No Atrophie Blanche: No Crepitus: No Cyanosis: No Degregorio, Aerin R ([id number]) [id number] Page 18 of 19 Excoriation: No Ecchymosis: No Induration: No Erythema: No Rash: No Hemosiderin Staining: No Scarring: No Mottled: No Pallor: No Moisture Rubor: No No Abnormalities Noted: No Dry / Scaly: Yes Maceration: No Treatment Notes Wound #5 (Calcaneus) Wound Laterality: Right, Lateral Cleanser Soap and Water Discharge Instruction: May shower and wash wound with dial antibacterial soap and water prior to dressing change. Vashe 5.8 (oz) Discharge Instruction: Cleanse the wound  with Vashe prior to applying a clean dressing using gauze sponges, not tissue or cotton balls. Peri-Wound Care Triamcinolone 15 (g) Discharge Instruction: Use triamcinolone ****liberally*** Sween Lotion (Moisturizing lotion) Discharge Instruction: Apply moisturizing lotion as directed Topical Triamcinolone Discharge Instruction: Apply Triamcinolone as directed liberallly Primary Dressing Hydrofera Blue Ready Transfer Foam, 2.5x2.5 (in/in) Discharge Instruction: Apply directly to wound bed as directed Santyl Ointment Discharge Instruction: Apply nickel thick amount to wound bed as instructed Secondary Dressing ABD Pad, 8x10 Discharge Instruction: Apply over primary dressing as directed. Woven Gauze Sponge, Non-Sterile 4x4 in Discharge Instruction: Apply over primary dressing as directed. Secured With Compression Wrap Kerlix Roll 4.5x3.1 (in/yd) Discharge Instruction: Apply Kerlix and Coban compression as directed. Coban Self-Adherent Wrap 4x5 (in/yd) Discharge Instruction: Apply over Kerlix as directed. Compression Stockings Add-Ons Electronic Signature(s) Signed: 04/05/2023 4:40:44 PM By: Shawn Stall RN, BSN Entered By: Shawn Stall on 04/05/2023 06:16:06 -------------------------------------------------------------------------------- Vitals Details Patient Name: Date of Service: [name], [name] 04/05/2023 8:45 A M Medical Record Number: [id number] Patient Account Number: [id number] Date of Birth/Sex: Treating RN: 1985-07-18 (37 y.o. [name] Primary Care Svetlana Bagby: Sanda Linger Other Clinician: Referring Desare Duddy: Treating Lurlene Ronda/Extender: Madelyn Flavors in Treatment: 1 Conrad, Lenord Carbo ([id number]) [id number] Page 19 of 19 Vital Signs Time Taken: 09:05 Temperature (F): 98.9 Height (in): 69 Pulse (bpm): 87 Weight (lbs): 272 Respiratory Rate (breaths/min): 20 Body Mass Index (BMI): 40.2 Blood Pressure  (mmHg): 146/96 Reference Range: 80 - 120 mg / dl Electronic Signature(s) Signed: 04/05/2023 4:40:44 PM By: Shawn Stall RN, BSN Entered By: Shawn Stall on 04/05/2023 06:05:14  Hemosiderin Staining: No Scarring: No Mottled: No Pallor: No Moisture Rubor: No No Abnormalities Noted: No Dry / Scaly: Yes Maceration: No Treatment Notes Wound #2 (Foot) Wound Laterality: Left, Distal LYNETT, BRASIL R ([id number]) [id number] Page 13 of 19 Cleanser Soap and Water Discharge Instruction: May shower and wash wound with dial antibacterial soap and water prior to dressing change. Vashe 5.8 (oz) Discharge Instruction: Cleanse the wound with Vashe prior to applying a clean dressing using gauze sponges, not tissue or cotton balls. Peri-Wound Care Triamcinolone 15 (g) Discharge Instruction: Use triamcinolone ****liberally*** Sween Lotion (Moisturizing lotion) Discharge Instruction: Apply moisturizing lotion as directed Topical Triamcinolone Discharge Instruction: Apply Triamcinolone as directed liberallly Primary Dressing Hydrofera Blue Ready Transfer Foam, 2.5x2.5 (in/in) Discharge Instruction: Apply directly to wound bed as directed Santyl Ointment Discharge Instruction: Apply nickel thick amount to wound bed as instructed Secondary Dressing ABD Pad, 8x10 Discharge Instruction: Apply over primary dressing as directed. Woven Gauze Sponge, Non-Sterile 4x4 in Discharge Instruction: Apply over primary dressing as directed. Secured With Compression Wrap Kerlix Roll 4.5x3.1 (in/yd) Discharge Instruction: Apply Kerlix and Coban compression as directed. Coban Self-Adherent Wrap 4x5  (in/yd) Discharge Instruction: Apply over Kerlix as directed. Compression Stockings Add-Ons Electronic Signature(s) Signed: 04/05/2023 4:40:44 PM By: Shawn Stall RN, BSN Entered By: Shawn Stall on 04/05/2023 06:15:01 -------------------------------------------------------------------------------- Wound Assessment Details Patient Name: Date of Service: [name], [name] 04/05/2023 8:45 A M Medical Record Number: [id number] Patient Account Number: [id number] Date of Birth/Sex: Treating RN: May 27, 1986 (37 y.o. [name] Primary Care Yobana Culliton: Sanda Linger Other Clinician: Referring Glenwood Revoir: Treating Paislynn Hegstrom/Extender: Madelyn Flavors in Treatment: 1 Wound Status Wound Number: 3 Primary Etiology: Lymphedema Wound Location: Left, Proximal, Dorsal Foot Wound Status: Open Wounding Event: Gradually Appeared Comorbid History: Anemia, Sleep Apnea, Vasculitis, Neuropathy Date Acquired: 03/09/2023 Weeks Of Treatment: 1 Clustered Wound: Yes Photos MERIT, GADSBY R ([id number]) [phone].pdf Page 14 of 19 Wound Measurements Length: (cm) Width: (cm) Depth: (cm) Clustered Quantity: Area: (cm) Volume: (cm) 8.5 % Reduction in Area: -128.9% 5.6 % Reduction in Volume: -14.4% 0.1 Epithelialization: None 9 Tunneling: No 37.385 Undermining: No 3.738 Wound Description Classification: Unclassifiable Wound Margin: Distinct, outline attached Exudate Amount: Medium Exudate Type: Serosanguineous Exudate Color: red, brown Foul Odor After Cleansing: No Slough/Fibrino Yes Wound Bed Granulation Amount: None Present (0%) Exposed Structure Necrotic Amount: Large (67-100%) Fascia Exposed: No Necrotic Quality: Eschar, Adherent Slough Fat Layer (Subcutaneous Tissue) Exposed: No Tendon Exposed: No Muscle Exposed: No Joint Exposed: No Bone Exposed: No Periwound Skin Texture Texture Color No Abnormalities Noted: No No Abnormalities  Noted: No Callus: No Atrophie Blanche: No Crepitus: No Cyanosis: No Excoriation: No Ecchymosis: No Induration: No Erythema: No Rash: No Hemosiderin Staining: No Scarring: No Mottled: No Pallor: No Moisture Rubor: No No Abnormalities Noted: No Dry / Scaly: Yes Maceration: No Treatment Notes Wound #3 (Foot) Wound Laterality: Dorsal, Left, Proximal Cleanser Soap and Water Discharge Instruction: May shower and wash wound with dial antibacterial soap and water prior to dressing change. Vashe 5.8 (oz) Discharge Instruction: Cleanse the wound with Vashe prior to applying a clean dressing using gauze sponges, not tissue or cotton balls. Peri-Wound Care Triamcinolone 15 (g) Discharge Instruction: Use triamcinolone ****liberally*** Sween Lotion (Moisturizing lotion) Discharge Instruction: Apply moisturizing lotion as directed Topical Triamcinolone Discharge Instruction: Apply Triamcinolone as directed liberallly Primary Dressing [facility] Blue Ready Transfer Foam, 2.5x2.5 (in/in) Petrosian, Cheryln R ([id number]) [phone].pdf Page 15 of 19 Discharge Instruction: Apply directly to wound bed as directed Santyl Ointment Discharge

## 2023-04-06 NOTE — Progress Notes (Signed)
Vashe prior to applying a clean dressing using gauze sponges, not tissue or cotton balls. Peri-Wound Care: Triamcinolone 15 (g) 1 x Per Week/30 Days Discharge Instructions: Use triamcinolone ****liberally*** Peri-Wound Care: Sween Lotion (Moisturizing lotion) 1 x Per Week/30 Days Discharge Instructions: Apply moisturizing lotion as directed Topical: Triamcinolone 1 x Per  Week/30 Days Discharge Instructions: Apply Triamcinolone as directed liberallly Prim Dressing: Hydrofera Blue Ready Transfer Foam, 2.5x2.5 (in/in) 1 x Per Week/30 Days ary Discharge Instructions: Apply directly to wound bed as directed Prim Dressing: Santyl Ointment 1 x Per Week/30 Days ary Discharge Instructions: Apply nickel thick amount to wound bed as instructed Secondary Dressing: ABD Pad, 8x10 1 x Per Week/30 Days Discharge Instructions: Apply over primary dressing as directed. Secondary Dressing: Woven Gauze Sponge, Non-Sterile 4x4 in 1 x Per Week/30 Days Discharge Instructions: Apply over primary dressing as directed. Compression Wrap: Kerlix Roll 4.5x3.1 (in/yd) 1 x Per Week/30 Days Discharge Instructions: Apply Kerlix and Coban compression as directed. Compression Wrap: Coban Self-Adherent Wrap 4x5 (in/yd) 1 x Per Week/30 Days Discharge Instructions: Apply over Kerlix as directed. LAVERLE, PILLARD R ([id number]) [id number] Page 8 of 13 Electronic Signature(s) Signed: 04/05/2023 4:40:44 PM By: Shawn Stall RN, BSN Signed: 04/06/2023 7:25:57 AM By: Baltazar Najjar MD Entered By: Shawn Stall on 04/05/2023 06:36:33 -------------------------------------------------------------------------------- Problem List Details Patient Name: Date of Service: [name], [name] 04/05/2023 8:45 A M Medical Record Number: [id number] Patient Account Number: [id number] Date of Birth/Sex: Treating RN: 1985-12-31 (37 y.o. 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 04/05/2023 8:45 A M Medical Record Number: [id number] Patient Account Number: [id number] Date of Birth/Sex: Treating RN: 15-Apr-1986 (37 y.o. F) Primary Care Provider: Sanda Linger Other Clinician: Referring Provider: Treating Provider/Extender: Madelyn Flavors in Treatment: 1 Subjective History of Present Illness (HPI) ADMISSION 03/23/2023 [name] ([id number]) [id number] Page 9 of 13 This is a 37 year old woman who is Story is somewhat difficult to follow however she apparently has developed bilateral lower extremity swelling involving her feet and ankles roughly 3 weeks ago. This but this became associated with small painful areas on predominantly her left greater than right foot with exfoliation of the skin. She was admitted to hospital from 03/16/2023 through 03/19/2023. X-ray of the feet was negative however she was noted to have a sedimentation rate greater than 90. Although she was empirically given antibiotics at the start of the admission. She was seen by infectious disease who did not feel this was infectious. It was felt that she required a skin biopsy for possible vasculitis. Dermatology and rheumatology consults were suggested. She was discharged from the hospital on a gradually tapering dose of prednisone. She had cryoglobulins, complement levels measured. She did not have an active urinalysis. She has been soaking her feet in ice water and cold towels to relieve the pain. She saw her dermatologist who is in [place]  left dorsal foot left lateral ankle. There is also areas on the right heel. Past medical history includes acute hepatitis question involving alcohol or hepatitis A, bilateral leg edema, obesity, hypertension, history of psoriasis on [place], chronic pain, apparently some form of idiopathic peripheral neuropathy, vitamin B12 deficiency ABIs in our clinic were 1.03 on the right 1.25 on the left Addendum 03/25/2023. I received the records from Dr. Cheree Ditto at [facility] dermatology in [place]. Looking at her record she felt that the issue was secondary to lymphedema and stasis dermatitis on both legs. I am not sure whether she looked at the areas on her feet which was the real area of concern.  She specifically stated that she did not think that this was related to her psoriasis. Compression stockings were suggested. No biopsy was done 9/24; as noted above I did receive records from Dr. Cheree Ditto at [facility] dermatology and [place]. She felt the skin changes on her feet were related to lymphedema and stasis dermatitis. The patient arrived back in clinic the skin on her dorsal feet looks a lot better and she is in a lot less pain. HOWEVER she does have multiple small punched- out areas covered in black eschar. I did not biopsy this when I saw her the first time as she was going to dermatology the next day in retrospect that may not have been the right decision. 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 9/30; patient arrives in clinic with no complaints of pain at all. This is quite an improvement from the first time I saw her. T the punched out holes on her dorsal o feet and right ankle we have been using Santyl. The eschar is loosening to the point where it might be a reasonably easy debridement. We have also been using TCA and the skin on her feet looks a lot better. She is still on prednisone 40 mg. This was started tapering 60 mg 10 mg weekly in the hospital for vasculitis. Unfortunately no tissue biopsy was done. Electronic Signature(s) Signed: 04/06/2023 7:25:57 AM By: Baltazar Najjar MD Entered By: Baltazar Najjar on 04/05/2023 06:46:33 -------------------------------------------------------------------------------- Physical Exam Details Patient Name: Date of Service: [name], [name] 04/05/2023 8:45 A M Medical Record Number: [id number] Patient Account Number: [id number] Date of Birth/Sex: Treating RN: [birth year] (37 y.o. F) Primary Care Provider: Sanda Linger Other Clinician: Referring Provider: Treating Provider/Extender: Madelyn Flavors in Treatment:  1 [place], Lenord Carbo ([id number]) [id number] Page 5 of 13 Constitutional Patient is hypertensive.. Pulse regular and within target range for patient.Marland Kitchen Respirations regular, non-labored and within target range.. Temperature is normal and within the target range for the patient.Marland Kitchen Appears in no distress. Cardiovascular Dorsalis pedis and posterior tibial pulses are palpable bilaterally. Notes Wound exam; the areas on the dorsal feet bilaterally the right lateral ankle right lateral calcaneus all of these black eschared areas look better. The eschar is gradually loosening. Skin on her feet and ankles looks a lot better. Electronic Signature(s) Signed: 04/06/2023 7:25:57 AM By: Baltazar Najjar MD Entered By: Baltazar Najjar on 04/05/2023 06:47:40 -------------------------------------------------------------------------------- Physician Orders Details Patient Name: Date of Service: [name], [name] 04/05/2023 8:45 A M Medical Record Number: [id number] Patient Account Number: [id number] Date of Birth/Sex: Treating RN: 1985/08/13 (37 y.o. [name] Primary Care Provider: Sanda Linger Other Clinician: Referring Provider: Treating Provider/Extender: Madelyn Flavors  Vashe prior to applying a clean dressing using gauze sponges, not tissue or cotton balls. Peri-Wound Care: Triamcinolone 15 (g) 1 x Per Week/30 Days Discharge Instructions: Use triamcinolone ****liberally*** Peri-Wound Care: Sween Lotion (Moisturizing lotion) 1 x Per Week/30 Days Discharge Instructions: Apply moisturizing lotion as directed Topical: Triamcinolone 1 x Per  Week/30 Days Discharge Instructions: Apply Triamcinolone as directed liberallly Prim Dressing: Hydrofera Blue Ready Transfer Foam, 2.5x2.5 (in/in) 1 x Per Week/30 Days ary Discharge Instructions: Apply directly to wound bed as directed Prim Dressing: Santyl Ointment 1 x Per Week/30 Days ary Discharge Instructions: Apply nickel thick amount to wound bed as instructed Secondary Dressing: ABD Pad, 8x10 1 x Per Week/30 Days Discharge Instructions: Apply over primary dressing as directed. Secondary Dressing: Woven Gauze Sponge, Non-Sterile 4x4 in 1 x Per Week/30 Days Discharge Instructions: Apply over primary dressing as directed. Compression Wrap: Kerlix Roll 4.5x3.1 (in/yd) 1 x Per Week/30 Days Discharge Instructions: Apply Kerlix and Coban compression as directed. Compression Wrap: Coban Self-Adherent Wrap 4x5 (in/yd) 1 x Per Week/30 Days Discharge Instructions: Apply over Kerlix as directed. LAVERLE, PILLARD R ([id number]) [id number] Page 8 of 13 Electronic Signature(s) Signed: 04/05/2023 4:40:44 PM By: Shawn Stall RN, BSN Signed: 04/06/2023 7:25:57 AM By: Baltazar Najjar MD Entered By: Shawn Stall on 04/05/2023 06:36:33 -------------------------------------------------------------------------------- Problem List Details Patient Name: Date of Service: [name], [name] 04/05/2023 8:45 A M Medical Record Number: [id number] Patient Account Number: [id number] Date of Birth/Sex: Treating RN: 1985-12-31 (37 y.o. 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 04/05/2023 8:45 A M Medical Record Number: [id number] Patient Account Number: [id number] Date of Birth/Sex: Treating RN: 15-Apr-1986 (37 y.o. F) Primary Care Provider: Sanda Linger Other Clinician: Referring Provider: Treating Provider/Extender: Madelyn Flavors in Treatment: 1 Subjective History of Present Illness (HPI) ADMISSION 03/23/2023 [name] ([id number]) [id number] Page 9 of 13 This is a 37 year old woman who is Story is somewhat difficult to follow however she apparently has developed bilateral lower extremity swelling involving her feet and ankles roughly 3 weeks ago. This but this became associated with small painful areas on predominantly her left greater than right foot with exfoliation of the skin. She was admitted to hospital from 03/16/2023 through 03/19/2023. X-ray of the feet was negative however she was noted to have a sedimentation rate greater than 90. Although she was empirically given antibiotics at the start of the admission. She was seen by infectious disease who did not feel this was infectious. It was felt that she required a skin biopsy for possible vasculitis. Dermatology and rheumatology consults were suggested. She was discharged from the hospital on a gradually tapering dose of prednisone. She had cryoglobulins, complement levels measured. She did not have an active urinalysis. She has been soaking her feet in ice water and cold towels to relieve the pain. She saw her dermatologist who is in [place]  Vashe prior to applying a clean dressing using gauze sponges, not tissue or cotton balls. Peri-Wound Care: Triamcinolone 15 (g) 1 x Per Week/30 Days Discharge Instructions: Use triamcinolone ****liberally*** Peri-Wound Care: Sween Lotion (Moisturizing lotion) 1 x Per Week/30 Days Discharge Instructions: Apply moisturizing lotion as directed Topical: Triamcinolone 1 x Per  Week/30 Days Discharge Instructions: Apply Triamcinolone as directed liberallly Prim Dressing: Hydrofera Blue Ready Transfer Foam, 2.5x2.5 (in/in) 1 x Per Week/30 Days ary Discharge Instructions: Apply directly to wound bed as directed Prim Dressing: Santyl Ointment 1 x Per Week/30 Days ary Discharge Instructions: Apply nickel thick amount to wound bed as instructed Secondary Dressing: ABD Pad, 8x10 1 x Per Week/30 Days Discharge Instructions: Apply over primary dressing as directed. Secondary Dressing: Woven Gauze Sponge, Non-Sterile 4x4 in 1 x Per Week/30 Days Discharge Instructions: Apply over primary dressing as directed. Compression Wrap: Kerlix Roll 4.5x3.1 (in/yd) 1 x Per Week/30 Days Discharge Instructions: Apply Kerlix and Coban compression as directed. Compression Wrap: Coban Self-Adherent Wrap 4x5 (in/yd) 1 x Per Week/30 Days Discharge Instructions: Apply over Kerlix as directed. LAVERLE, PILLARD R ([id number]) [id number] Page 8 of 13 Electronic Signature(s) Signed: 04/05/2023 4:40:44 PM By: Shawn Stall RN, BSN Signed: 04/06/2023 7:25:57 AM By: Baltazar Najjar MD Entered By: Shawn Stall on 04/05/2023 06:36:33 -------------------------------------------------------------------------------- Problem List Details Patient Name: Date of Service: [name], [name] 04/05/2023 8:45 A M Medical Record Number: [id number] Patient Account Number: [id number] Date of Birth/Sex: Treating RN: 1985-12-31 (37 y.o. 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 04/05/2023 8:45 A M Medical Record Number: [id number] Patient Account Number: [id number] Date of Birth/Sex: Treating RN: 15-Apr-1986 (37 y.o. F) Primary Care Provider: Sanda Linger Other Clinician: Referring Provider: Treating Provider/Extender: Madelyn Flavors in Treatment: 1 Subjective History of Present Illness (HPI) ADMISSION 03/23/2023 [name] ([id number]) [id number] Page 9 of 13 This is a 37 year old woman who is Story is somewhat difficult to follow however she apparently has developed bilateral lower extremity swelling involving her feet and ankles roughly 3 weeks ago. This but this became associated with small painful areas on predominantly her left greater than right foot with exfoliation of the skin. She was admitted to hospital from 03/16/2023 through 03/19/2023. X-ray of the feet was negative however she was noted to have a sedimentation rate greater than 90. Although she was empirically given antibiotics at the start of the admission. She was seen by infectious disease who did not feel this was infectious. It was felt that she required a skin biopsy for possible vasculitis. Dermatology and rheumatology consults were suggested. She was discharged from the hospital on a gradually tapering dose of prednisone. She had cryoglobulins, complement levels measured. She did not have an active urinalysis. She has been soaking her feet in ice water and cold towels to relieve the pain. She saw her dermatologist who is in [place]  Performed By: Clinician Shawn Stall, RN Debridement Type: Chemical/Enzymatic/Mechanical Agent Used: Santyl Level of Consciousness (Pre-procedure): Awake and Alert Pre-procedure Verification/Time Out No Taken: Percent of Wound Bed Debrided: Bleeding: None Response to Treatment: Procedure was tolerated well Level of Consciousness (Post- Awake and Alert procedure): Post Debridement Measurements of Total Wound Length: (cm) 5.9 Width: (cm) 8 Depth: (cm) 0.2 Volume: (cm) 7.414 Character of Wound/Ulcer Post Debridement: Requires Further Debridement Post Procedure Diagnosis Same as Pre-procedure Electronic Signature(s) Signed: 04/05/2023 4:40:44 PM By: Shawn Stall RN, BSN Signed: 04/06/2023 7:25:57 AM By: Baltazar Najjar MD Entered By: Shawn Stall on 04/05/2023 06:37:47 -------------------------------------------------------------------------------- Debridement Details Patient Name: Date of Service: [name], [name] 04/05/2023 8:45 A M Medical Record Number: [id number] Patient Account Number: [id number] Date of Birth/Sex: Treating  RN: 1985-12-22 (37 y.o. [name], [name] Primary Care Provider: Sanda Linger Other Clinician: Referring Provider: Treating Provider/Extender: Madelyn Flavors in Treatment: 1 Debridement Performed for Assessment: Wound #5 Right,Lateral Calcaneus Performed By: Clinician Shawn Stall, RN Debridement Type: Chemical/Enzymatic/Mechanical Agent Used: Santyl Level of Consciousness (Pre-procedure): Awake and Alert Pre-procedure Verification/Time Out No Taken: Percent of Wound Bed Debrided: Bleeding: None Response to Treatment: Procedure was tolerated well Level of Consciousness (Post- Awake and Alert procedure): Post Debridement Measurements of Total Wound Length: (cm) 2 Width: (cm) 2.9 Depth: (cm) 0.1 Volume: (cm) 0.456 Character of Wound/Ulcer Post Debridement: Requires Further Debridement Post Procedure Diagnosis Same as Pre-procedure Electronic Signature(s) Signed: 04/05/2023 4:40:44 PM By: Shawn Stall RN, BSN Signed: 04/06/2023 7:25:57 AM By: Baltazar Najjar MD Entered By: Shawn Stall on 04/05/2023 06:38:04 [name] ([id number]) [id number] Page 4 of 13 -------------------------------------------------------------------------------- HPI Details Patient Name: Date of Service: GA MMA [name] NDRA R. 04/05/2023 8:45 A M Medical Record Number: [id number] Patient Account Number: [id number] Date of Birth/Sex: Treating RN: 1985-10-12 (37 y.o. F) Primary Care Provider: Sanda Linger Other Clinician: Referring Provider: Treating Provider/Extender: Madelyn Flavors in Treatment: 1 History of Present Illness HPI Description: ADMISSION 03/23/2023 This is a 37 year old woman who is Story is somewhat difficult to follow however she apparently has developed bilateral lower extremity swelling involving her feet and ankles roughly 3 weeks ago. This but this became associated with small painful areas on predominantly  her left greater than right foot with exfoliation of the skin. She was admitted to hospital from 03/16/2023 through 03/19/2023. X-ray of the feet was negative however she was noted to have a sedimentation rate greater than 90. Although she was empirically given antibiotics at the start of the admission. She was seen by infectious disease who did not feel this was infectious. It was felt that she required a skin biopsy for possible vasculitis. Dermatology and rheumatology consults were suggested. She was discharged from the hospital on a gradually tapering dose of prednisone. She had cryoglobulins, complement levels measured. She did not have an active urinalysis. She has been soaking her feet in ice water and cold towels to relieve the pain. She saw her dermatologist who is in [place] yesterday for consideration of a skin biopsy. Per the patient's description the dermatologist did not wish to do a skin biopsy stating that he did not want to create another wound area although I do not have his or her notes at this point and I do not know what diagnosis she was felt to have. She arrives in clinic today with discomfort in her feet. Exfoliating skin on the dorsal surfaces of both feet punched-out areas with necrosis especially on the  Performed By: Clinician Shawn Stall, RN Debridement Type: Chemical/Enzymatic/Mechanical Agent Used: Santyl Level of Consciousness (Pre-procedure): Awake and Alert Pre-procedure Verification/Time Out No Taken: Percent of Wound Bed Debrided: Bleeding: None Response to Treatment: Procedure was tolerated well Level of Consciousness (Post- Awake and Alert procedure): Post Debridement Measurements of Total Wound Length: (cm) 5.9 Width: (cm) 8 Depth: (cm) 0.2 Volume: (cm) 7.414 Character of Wound/Ulcer Post Debridement: Requires Further Debridement Post Procedure Diagnosis Same as Pre-procedure Electronic Signature(s) Signed: 04/05/2023 4:40:44 PM By: Shawn Stall RN, BSN Signed: 04/06/2023 7:25:57 AM By: Baltazar Najjar MD Entered By: Shawn Stall on 04/05/2023 06:37:47 -------------------------------------------------------------------------------- Debridement Details Patient Name: Date of Service: [name], [name] 04/05/2023 8:45 A M Medical Record Number: [id number] Patient Account Number: [id number] Date of Birth/Sex: Treating  RN: 1985-12-22 (37 y.o. [name], [name] Primary Care Provider: Sanda Linger Other Clinician: Referring Provider: Treating Provider/Extender: Madelyn Flavors in Treatment: 1 Debridement Performed for Assessment: Wound #5 Right,Lateral Calcaneus Performed By: Clinician Shawn Stall, RN Debridement Type: Chemical/Enzymatic/Mechanical Agent Used: Santyl Level of Consciousness (Pre-procedure): Awake and Alert Pre-procedure Verification/Time Out No Taken: Percent of Wound Bed Debrided: Bleeding: None Response to Treatment: Procedure was tolerated well Level of Consciousness (Post- Awake and Alert procedure): Post Debridement Measurements of Total Wound Length: (cm) 2 Width: (cm) 2.9 Depth: (cm) 0.1 Volume: (cm) 0.456 Character of Wound/Ulcer Post Debridement: Requires Further Debridement Post Procedure Diagnosis Same as Pre-procedure Electronic Signature(s) Signed: 04/05/2023 4:40:44 PM By: Shawn Stall RN, BSN Signed: 04/06/2023 7:25:57 AM By: Baltazar Najjar MD Entered By: Shawn Stall on 04/05/2023 06:38:04 [name] ([id number]) [id number] Page 4 of 13 -------------------------------------------------------------------------------- HPI Details Patient Name: Date of Service: GA MMA [name] NDRA R. 04/05/2023 8:45 A M Medical Record Number: [id number] Patient Account Number: [id number] Date of Birth/Sex: Treating RN: 1985-10-12 (37 y.o. F) Primary Care Provider: Sanda Linger Other Clinician: Referring Provider: Treating Provider/Extender: Madelyn Flavors in Treatment: 1 History of Present Illness HPI Description: ADMISSION 03/23/2023 This is a 37 year old woman who is Story is somewhat difficult to follow however she apparently has developed bilateral lower extremity swelling involving her feet and ankles roughly 3 weeks ago. This but this became associated with small painful areas on predominantly  her left greater than right foot with exfoliation of the skin. She was admitted to hospital from 03/16/2023 through 03/19/2023. X-ray of the feet was negative however she was noted to have a sedimentation rate greater than 90. Although she was empirically given antibiotics at the start of the admission. She was seen by infectious disease who did not feel this was infectious. It was felt that she required a skin biopsy for possible vasculitis. Dermatology and rheumatology consults were suggested. She was discharged from the hospital on a gradually tapering dose of prednisone. She had cryoglobulins, complement levels measured. She did not have an active urinalysis. She has been soaking her feet in ice water and cold towels to relieve the pain. She saw her dermatologist who is in [place] yesterday for consideration of a skin biopsy. Per the patient's description the dermatologist did not wish to do a skin biopsy stating that he did not want to create another wound area although I do not have his or her notes at this point and I do not know what diagnosis she was felt to have. She arrives in clinic today with discomfort in her feet. Exfoliating skin on the dorsal surfaces of both feet punched-out areas with necrosis especially on the  pain is completely resolved. 4. Unfortunately this was never biopsied as I previously discussed. I had thought dermatology would go ahead and do this they did not. If she develops new lesions then again I think these this would need to be biopsied. This would be as the prednisone is tapered 5. She asked me about compression wraps I absolutely think this needs to be continued Electronic Signature(s) Signed: 04/06/2023 7:25:57 AM By: Baltazar Najjar MD Entered By: Baltazar Najjar on 04/05/2023 06:49:06 -------------------------------------------------------------------------------- SuperBill Details Patient Name: Date of Service: [name], [name] 04/05/2023 Medical Record Number: [id number] Patient Account Number: [id number] Date of Birth/Sex: Treating RN: 12/30/1985 (37 y.o. 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 vasculitis limited to the skin Facility Procedures : CPT4 Code: [id number] Description: 7602 - DEBRIDE W/O ANES NON SELECT Modifier: Quantity: 1 Physician Procedures : CPT4 Code Description Modifier [id number] - WC PHYS LEVEL 4 - EST PT ICD-10 Diagnosis Description L97.518 Non-pressure chronic ulcer of other  part of right foot with other specified severity L97.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 Peri-Wound Care: Triamcinolone 15 (g) 1 x Per Week/30 Days Discharge Instructions: Use triamcinolone ****liberally*** Peri-Wound Care: Sween Lotion (Moisturizing lotion) 1 x Per Week/30 Days Discharge Instructions: Apply moisturizing lotion as directed Topical: Triamcinolone 1 x Per  Week/30 Days Discharge Instructions: Apply Triamcinolone as directed liberallly Prim Dressing: Hydrofera Blue Ready Transfer Foam, 2.5x2.5 (in/in) 1 x Per Week/30 Days ary Discharge Instructions: Apply directly to wound bed as directed Prim Dressing: Santyl Ointment 1 x Per Week/30 Days ary Discharge Instructions: Apply nickel thick amount to wound bed as instructed Secondary Dressing: ABD Pad, 8x10 1 x Per Week/30 Days Discharge Instructions: Apply over primary dressing as directed. Secondary Dressing: Woven Gauze Sponge, Non-Sterile 4x4 in 1 x Per Week/30 Days Discharge Instructions: Apply over primary dressing as directed. Compression Wrap: Kerlix Roll 4.5x3.1 (in/yd) 1 x Per Week/30 Days Discharge Instructions: Apply Kerlix and Coban compression as directed. Compression Wrap: Coban Self-Adherent Wrap 4x5 (in/yd) 1 x Per Week/30 Days Discharge Instructions: Apply over Kerlix as directed. LAVERLE, PILLARD R ([id number]) [id number] Page 8 of 13 Electronic Signature(s) Signed: 04/05/2023 4:40:44 PM By: Shawn Stall RN, BSN Signed: 04/06/2023 7:25:57 AM By: Baltazar Najjar MD Entered By: Shawn Stall on 04/05/2023 06:36:33 -------------------------------------------------------------------------------- Problem List Details Patient Name: Date of Service: [name], [name] 04/05/2023 8:45 A M Medical Record Number: [id number] Patient Account Number: [id number] Date of Birth/Sex: Treating RN: 1985-12-31 (37 y.o. 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 04/05/2023 8:45 A M Medical Record Number: [id number] Patient Account Number: [id number] Date of Birth/Sex: Treating RN: 15-Apr-1986 (37 y.o. F) Primary Care Provider: Sanda Linger Other Clinician: Referring Provider: Treating Provider/Extender: Madelyn Flavors in Treatment: 1 Subjective History of Present Illness (HPI) ADMISSION 03/23/2023 [name] ([id number]) [id number] Page 9 of 13 This is a 37 year old woman who is Story is somewhat difficult to follow however she apparently has developed bilateral lower extremity swelling involving her feet and ankles roughly 3 weeks ago. This but this became associated with small painful areas on predominantly her left greater than right foot with exfoliation of the skin. She was admitted to hospital from 03/16/2023 through 03/19/2023. X-ray of the feet was negative however she was noted to have a sedimentation rate greater than 90. Although she was empirically given antibiotics at the start of the admission. She was seen by infectious disease who did not feel this was infectious. It was felt that she required a skin biopsy for possible vasculitis. Dermatology and rheumatology consults were suggested. She was discharged from the hospital on a gradually tapering dose of prednisone. She had cryoglobulins, complement levels measured. She did not have an active urinalysis. She has been soaking her feet in ice water and cold towels to relieve the pain. She saw her dermatologist who is in [place]  Vashe prior to applying a clean dressing using gauze sponges, not tissue or cotton balls. Peri-Wound Care: Triamcinolone 15 (g) 1 x Per Week/30 Days Discharge Instructions: Use triamcinolone ****liberally*** Peri-Wound Care: Sween Lotion (Moisturizing lotion) 1 x Per Week/30 Days Discharge Instructions: Apply moisturizing lotion as directed Topical: Triamcinolone 1 x Per  Week/30 Days Discharge Instructions: Apply Triamcinolone as directed liberallly Prim Dressing: Hydrofera Blue Ready Transfer Foam, 2.5x2.5 (in/in) 1 x Per Week/30 Days ary Discharge Instructions: Apply directly to wound bed as directed Prim Dressing: Santyl Ointment 1 x Per Week/30 Days ary Discharge Instructions: Apply nickel thick amount to wound bed as instructed Secondary Dressing: ABD Pad, 8x10 1 x Per Week/30 Days Discharge Instructions: Apply over primary dressing as directed. Secondary Dressing: Woven Gauze Sponge, Non-Sterile 4x4 in 1 x Per Week/30 Days Discharge Instructions: Apply over primary dressing as directed. Compression Wrap: Kerlix Roll 4.5x3.1 (in/yd) 1 x Per Week/30 Days Discharge Instructions: Apply Kerlix and Coban compression as directed. Compression Wrap: Coban Self-Adherent Wrap 4x5 (in/yd) 1 x Per Week/30 Days Discharge Instructions: Apply over Kerlix as directed. LAVERLE, PILLARD R ([id number]) [id number] Page 8 of 13 Electronic Signature(s) Signed: 04/05/2023 4:40:44 PM By: Shawn Stall RN, BSN Signed: 04/06/2023 7:25:57 AM By: Baltazar Najjar MD Entered By: Shawn Stall on 04/05/2023 06:36:33 -------------------------------------------------------------------------------- Problem List Details Patient Name: Date of Service: [name], [name] 04/05/2023 8:45 A M Medical Record Number: [id number] Patient Account Number: [id number] Date of Birth/Sex: Treating RN: 1985-12-31 (37 y.o. 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 04/05/2023 8:45 A M Medical Record Number: [id number] Patient Account Number: [id number] Date of Birth/Sex: Treating RN: 15-Apr-1986 (37 y.o. F) Primary Care Provider: Sanda Linger Other Clinician: Referring Provider: Treating Provider/Extender: Madelyn Flavors in Treatment: 1 Subjective History of Present Illness (HPI) ADMISSION 03/23/2023 [name] ([id number]) [id number] Page 9 of 13 This is a 37 year old woman who is Story is somewhat difficult to follow however she apparently has developed bilateral lower extremity swelling involving her feet and ankles roughly 3 weeks ago. This but this became associated with small painful areas on predominantly her left greater than right foot with exfoliation of the skin. She was admitted to hospital from 03/16/2023 through 03/19/2023. X-ray of the feet was negative however she was noted to have a sedimentation rate greater than 90. Although she was empirically given antibiotics at the start of the admission. She was seen by infectious disease who did not feel this was infectious. It was felt that she required a skin biopsy for possible vasculitis. Dermatology and rheumatology consults were suggested. She was discharged from the hospital on a gradually tapering dose of prednisone. She had cryoglobulins, complement levels measured. She did not have an active urinalysis. She has been soaking her feet in ice water and cold towels to relieve the pain. She saw her dermatologist who is in [place]  Performed By: Clinician Shawn Stall, RN Debridement Type: Chemical/Enzymatic/Mechanical Agent Used: Santyl Level of Consciousness (Pre-procedure): Awake and Alert Pre-procedure Verification/Time Out No Taken: Percent of Wound Bed Debrided: Bleeding: None Response to Treatment: Procedure was tolerated well Level of Consciousness (Post- Awake and Alert procedure): Post Debridement Measurements of Total Wound Length: (cm) 5.9 Width: (cm) 8 Depth: (cm) 0.2 Volume: (cm) 7.414 Character of Wound/Ulcer Post Debridement: Requires Further Debridement Post Procedure Diagnosis Same as Pre-procedure Electronic Signature(s) Signed: 04/05/2023 4:40:44 PM By: Shawn Stall RN, BSN Signed: 04/06/2023 7:25:57 AM By: Baltazar Najjar MD Entered By: Shawn Stall on 04/05/2023 06:37:47 -------------------------------------------------------------------------------- Debridement Details Patient Name: Date of Service: [name], [name] 04/05/2023 8:45 A M Medical Record Number: [id number] Patient Account Number: [id number] Date of Birth/Sex: Treating  RN: 1985-12-22 (37 y.o. [name], [name] Primary Care Provider: Sanda Linger Other Clinician: Referring Provider: Treating Provider/Extender: Madelyn Flavors in Treatment: 1 Debridement Performed for Assessment: Wound #5 Right,Lateral Calcaneus Performed By: Clinician Shawn Stall, RN Debridement Type: Chemical/Enzymatic/Mechanical Agent Used: Santyl Level of Consciousness (Pre-procedure): Awake and Alert Pre-procedure Verification/Time Out No Taken: Percent of Wound Bed Debrided: Bleeding: None Response to Treatment: Procedure was tolerated well Level of Consciousness (Post- Awake and Alert procedure): Post Debridement Measurements of Total Wound Length: (cm) 2 Width: (cm) 2.9 Depth: (cm) 0.1 Volume: (cm) 0.456 Character of Wound/Ulcer Post Debridement: Requires Further Debridement Post Procedure Diagnosis Same as Pre-procedure Electronic Signature(s) Signed: 04/05/2023 4:40:44 PM By: Shawn Stall RN, BSN Signed: 04/06/2023 7:25:57 AM By: Baltazar Najjar MD Entered By: Shawn Stall on 04/05/2023 06:38:04 [name] ([id number]) [id number] Page 4 of 13 -------------------------------------------------------------------------------- HPI Details Patient Name: Date of Service: GA MMA [name] NDRA R. 04/05/2023 8:45 A M Medical Record Number: [id number] Patient Account Number: [id number] Date of Birth/Sex: Treating RN: 1985-10-12 (37 y.o. F) Primary Care Provider: Sanda Linger Other Clinician: Referring Provider: Treating Provider/Extender: Madelyn Flavors in Treatment: 1 History of Present Illness HPI Description: ADMISSION 03/23/2023 This is a 37 year old woman who is Story is somewhat difficult to follow however she apparently has developed bilateral lower extremity swelling involving her feet and ankles roughly 3 weeks ago. This but this became associated with small painful areas on predominantly  her left greater than right foot with exfoliation of the skin. She was admitted to hospital from 03/16/2023 through 03/19/2023. X-ray of the feet was negative however she was noted to have a sedimentation rate greater than 90. Although she was empirically given antibiotics at the start of the admission. She was seen by infectious disease who did not feel this was infectious. It was felt that she required a skin biopsy for possible vasculitis. Dermatology and rheumatology consults were suggested. She was discharged from the hospital on a gradually tapering dose of prednisone. She had cryoglobulins, complement levels measured. She did not have an active urinalysis. She has been soaking her feet in ice water and cold towels to relieve the pain. She saw her dermatologist who is in [place] yesterday for consideration of a skin biopsy. Per the patient's description the dermatologist did not wish to do a skin biopsy stating that he did not want to create another wound area although I do not have his or her notes at this point and I do not know what diagnosis she was felt to have. She arrives in clinic today with discomfort in her feet. Exfoliating skin on the dorsal surfaces of both feet punched-out areas with necrosis especially on the  RUTA, CAPECE R ([id number]) [id number] Page 1 of 13 Visit Report for 04/05/2023 Debridement Details Patient Name: Date of Service: GA MMA [name] NDRA R. 04/05/2023 8:45 A M Medical Record Number: [id number] Patient Account Number: [id number] Date of Birth/Sex: Treating RN: [birth year] (37 y.o. [name], [name] Primary Care Provider: Sanda Linger Other Clinician: Referring Provider: Treating Provider/Extender: Madelyn Flavors in Treatment: 1 Debridement Performed for Assessment: Wound #1 Left,Medial Foot Performed By: Clinician Shawn Stall, RN Debridement Type: Chemical/Enzymatic/Mechanical Agent Used: Santyl Level of Consciousness (Pre-procedure): Awake and Alert Pre-procedure Verification/Time Out No Taken: Percent of Wound Bed Debrided: Bleeding: None Response to Treatment: Procedure was tolerated well Level of Consciousness (Post- Awake and Alert procedure): Post Debridement Measurements of Total Wound Length: (cm) 7 Width: (cm) 1 Depth: (cm) 0.1 Volume: (cm) 0.55 Character of Wound/Ulcer Post Debridement: Requires Further Debridement Post Procedure Diagnosis Same as Pre-procedure Electronic Signature(s) Signed: 04/05/2023 4:40:44 PM By: Shawn Stall RN, BSN Signed: 04/06/2023 7:25:57 AM By: Baltazar Najjar MD Entered By: Shawn Stall on 04/05/2023 06:37:01 -------------------------------------------------------------------------------- Debridement Details Patient Name: Date of Service: [name], [name] 04/05/2023 8:45 A M Medical Record Number: [id number] Patient Account Number: [id number] Date of Birth/Sex: Treating RN: 1985-10-19 (37 y.o. [name], [name] Primary Care Provider: Sanda Linger Other Clinician: Referring Provider: Treating Provider/Extender: Madelyn Flavors in Treatment: 1 Debridement Performed for Assessment: Wound #2 Left,Distal Foot Performed By: Clinician Shawn Stall,  RN Debridement Type: Chemical/Enzymatic/Mechanical Agent Used: Santyl Level of Consciousness (Pre-procedure): Awake and Alert Pre-procedure Verification/Time Out No Taken: Percent of Wound Bed Debrided: Bleeding: None Response to Treatment: Procedure was tolerated well Level of Consciousness (Post- Awake and Alert procedure): KEARIA, YIN R ([id number]) [id number] Page 2 of 13 Post Debridement Measurements of Total Wound Length: (cm) 3 Width: (cm) 3 Depth: (cm) 0.1 Volume: (cm) 0.707 Character of Wound/Ulcer Post Debridement: Requires Further Debridement Post Procedure Diagnosis Same as Pre-procedure Electronic Signature(s) Signed: 04/05/2023 4:40:44 PM By: Shawn Stall RN, BSN Signed: 04/06/2023 7:25:57 AM By: Baltazar Najjar MD Entered By: Shawn Stall on 04/05/2023 06:37:18 -------------------------------------------------------------------------------- Debridement Details Patient Name: Date of Service: [name], [name] 04/05/2023 8:45 A M Medical Record Number: [id number] Patient Account Number: [id number] Date of Birth/Sex: Treating RN: 06/06/1986 (37 y.o. [name], [name] Primary Care Provider: Sanda Linger Other Clinician: Referring Provider: Treating Provider/Extender: Madelyn Flavors in Treatment: 1 Debridement Performed for Assessment: Wound #3 Left,Proximal,Dorsal Foot Performed By: Clinician Shawn Stall, RN Debridement Type: Chemical/Enzymatic/Mechanical Agent Used: Santyl Level of Consciousness (Pre-procedure): Awake and Alert Pre-procedure Verification/Time Out No Taken: Percent of Wound Bed Debrided: Bleeding: None Response to Treatment: Procedure was tolerated well Level of Consciousness (Post- Awake and Alert procedure): Post Debridement Measurements of Total Wound Length: (cm) 8.5 Width: (cm) 5.6 Depth: (cm) 0.1 Volume: (cm) 3.738 Character of Wound/Ulcer Post Debridement: Requires Further  Debridement Post Procedure Diagnosis Same as Pre-procedure Electronic Signature(s) Signed: 04/05/2023 4:40:44 PM By: Shawn Stall RN, BSN Signed: 04/06/2023 7:25:57 AM By: Baltazar Najjar MD Entered By: Shawn Stall on 04/05/2023 06:37:33 -------------------------------------------------------------------------------- Debridement Details Patient Name: Date of Service: [name], [name] 04/05/2023 8:45 A M Medical Record Number: [id number] Patient Account Number: [id number] Date of Birth/Sex: Treating RN: 08-10-1985 (37 y.o. [name] Primary Care Provider: Sanda Linger Other Clinician: [name] ([id number]) [id number] Page 3 of 13 Referring Provider: Treating Provider/Extender: Madelyn Flavors in Treatment: 1 Debridement Performed for Assessment: Wound #4 Right,Dorsal Foot  Vashe prior to applying a clean dressing using gauze sponges, not tissue or cotton balls. Peri-Wound Care: Triamcinolone 15 (g) 1 x Per Week/30 Days Discharge Instructions: Use triamcinolone ****liberally*** Peri-Wound Care: Sween Lotion (Moisturizing lotion) 1 x Per Week/30 Days Discharge Instructions: Apply moisturizing lotion as directed Topical: Triamcinolone 1 x Per  Week/30 Days Discharge Instructions: Apply Triamcinolone as directed liberallly Prim Dressing: Hydrofera Blue Ready Transfer Foam, 2.5x2.5 (in/in) 1 x Per Week/30 Days ary Discharge Instructions: Apply directly to wound bed as directed Prim Dressing: Santyl Ointment 1 x Per Week/30 Days ary Discharge Instructions: Apply nickel thick amount to wound bed as instructed Secondary Dressing: ABD Pad, 8x10 1 x Per Week/30 Days Discharge Instructions: Apply over primary dressing as directed. Secondary Dressing: Woven Gauze Sponge, Non-Sterile 4x4 in 1 x Per Week/30 Days Discharge Instructions: Apply over primary dressing as directed. Compression Wrap: Kerlix Roll 4.5x3.1 (in/yd) 1 x Per Week/30 Days Discharge Instructions: Apply Kerlix and Coban compression as directed. Compression Wrap: Coban Self-Adherent Wrap 4x5 (in/yd) 1 x Per Week/30 Days Discharge Instructions: Apply over Kerlix as directed. LAVERLE, PILLARD R ([id number]) [id number] Page 8 of 13 Electronic Signature(s) Signed: 04/05/2023 4:40:44 PM By: Shawn Stall RN, BSN Signed: 04/06/2023 7:25:57 AM By: Baltazar Najjar MD Entered By: Shawn Stall on 04/05/2023 06:36:33 -------------------------------------------------------------------------------- Problem List Details Patient Name: Date of Service: [name], [name] 04/05/2023 8:45 A M Medical Record Number: [id number] Patient Account Number: [id number] Date of Birth/Sex: Treating RN: 1985-12-31 (37 y.o. 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 04/05/2023 8:45 A M Medical Record Number: [id number] Patient Account Number: [id number] Date of Birth/Sex: Treating RN: 15-Apr-1986 (37 y.o. F) Primary Care Provider: Sanda Linger Other Clinician: Referring Provider: Treating Provider/Extender: Madelyn Flavors in Treatment: 1 Subjective History of Present Illness (HPI) ADMISSION 03/23/2023 [name] ([id number]) [id number] Page 9 of 13 This is a 37 year old woman who is Story is somewhat difficult to follow however she apparently has developed bilateral lower extremity swelling involving her feet and ankles roughly 3 weeks ago. This but this became associated with small painful areas on predominantly her left greater than right foot with exfoliation of the skin. She was admitted to hospital from 03/16/2023 through 03/19/2023. X-ray of the feet was negative however she was noted to have a sedimentation rate greater than 90. Although she was empirically given antibiotics at the start of the admission. She was seen by infectious disease who did not feel this was infectious. It was felt that she required a skin biopsy for possible vasculitis. Dermatology and rheumatology consults were suggested. She was discharged from the hospital on a gradually tapering dose of prednisone. She had cryoglobulins, complement levels measured. She did not have an active urinalysis. She has been soaking her feet in ice water and cold towels to relieve the pain. She saw her dermatologist who is in [place]

## 2023-04-06 NOTE — Progress Notes (Signed)
04/06/2023 Name: [name] MRN: [id number] DOB: 1985/07/22  Chief Complaint  Patient presents with   Medication Management   Hypertension    [name] is a 37 y.o. year old female who presented for a telephone visit.   They were referred to the pharmacist by their PCP for assistance in managing hypertension and hypokalemia .    Subjective:  Care Team: Primary Care Provider: Etta Grandchild, MD ; Next Scheduled Visit: 07/05/2023 Wound clinic, next visit: 04/13/2023  Medication Access/Adherence  Current Pharmacy:  [facility] [address] [place] - [address] [place] [address] Phone: [phone] Fax: [phone]    Patient reports affordability concerns with their medications: No  Patient reports access/transportation concerns to their pharmacy: No  Patient reports adherence concerns with their medications:  No       Hypertension/hypokalemia:  Current medications: spironolactone 25 mg daily, nebivolol 10 mg daily, prazosin 1 mg daily *Patient confirms she stopped torsemide and triamterene-hydrochlorothiazide per PCP  Patient reports she has not been taking potassium chloride  Patient does not have a validated, automated, upper arm home BP cuff She got her BP checked at the wound clinic yesterday, however they did not tell her what the reading was. She states they will usually inform her if it is high.    Objective:  Lab Results  Component Value Date   HGBA1C 4.5 (L) 02/02/2023    Lab Results  Component Value Date   CREATININE 0.63 03/30/2023   BUN 11 03/30/2023   NA 140 03/30/2023   K 2.6 (LL) 03/30/2023   CL 96 03/30/2023   CO2 33 (H) 03/30/2023    Lab Results  Component Value Date   CHOL 193 02/11/2022   HDL 82.60 02/11/2022   LDLCALC 84 02/11/2022   LDLDIRECT 87.0 10/17/2020   TRIG 134.0 02/11/2022   CHOLHDL 2 02/11/2022    Medications Reviewed Today     Reviewed by Bonita Quin, RPH (Pharmacist) on 04/06/23 at 1607  Med List Status: <None>   Medication Order Taking? Sig Documenting Provider Last Dose Status Informant  doxepin (SINEQUAN) 50 MG capsule [id number]  Take 50 mg by mouth at bedtime as needed (sleep). [provider]  Active Self, Pharmacy Records  DULoxetine (CYMBALTA) 20 MG capsule [id number]  Take 20 mg by mouth daily. [provider]  Active Self, Pharmacy Records  Ferric Maltol (ACCRUFER) 30 MG CAPS [id number]  Take 1 capsule (30 mg total) by mouth in the morning and at bedtime. Etta Grandchild, MD  Active Self, Pharmacy Records   Patient not taking:   Discontinued 02/01/19 1822   folic acid (FOLVITE) 1 MG tablet [id number]  Take 1 tablet (1 mg total) by mouth daily. Etta Grandchild, MD  Active Self, Pharmacy Records  HYDROmorphone (DILAUDID) 2 MG tablet [id number]  Take 1 tablet (2 mg total) by mouth every 6 (six) hours as needed for severe pain. Etta Grandchild, MD  Active   Ixekizumab (TALTZ) 80 MG/ML SOSY [id number]  Inject 80 mg into the skin every 30 (thirty) days. [provider]  Active Self, Pharmacy Records  levalbuterol [facility] HFA) 45 MCG/ACT inhaler [id number]  INHALE 2 PUFFS INTO THE LUNGS EVERY 6 HOURS AS NEEDED FOR WHEEZE Etta Grandchild, MD  Active Self, Pharmacy Records  nebivolol (BYSTOLIC) 10 MG tablet [id number]  Take 1 tablet (10 mg total) by mouth daily. Etta Grandchild, MD  Active Self, Pharmacy Records   Patient not  taking:   Discontinued 02/01/19 1822   potassium chloride (KLOR-CON M10) 10 MEQ tablet [id number] No Take 1 tablet (10 mEq total) by mouth 2 (two) times daily.  Patient not taking: Reported on 04/06/2023   Etta Grandchild, MD Not Taking Active Self, Pharmacy Records           Med Note (CRUTHIS, Marcy Siren   Wed Mar 17, 2023 10:10 AM) Pt is adamant she is still taking this medication BID. Dispense report does not support this claim.  prazosin (MINIPRESS) 1 MG capsule [id number]  Take 1 mg by mouth daily.  [provider]  Active Self, Pharmacy Records  predniSONE (DELTASONE) 10 MG tablet [id number]  Take 6 tablets (60 mg total) by mouth daily with breakfast for 7 days, THEN 5 tablets (50 mg total) daily with breakfast for 7 days, THEN 4 tablets (40 mg total) daily with breakfast for 7 days, THEN 3 tablets (30 mg total) daily with breakfast for 7 days, THEN 2 tablets (20 mg total) daily with breakfast for 7 days, THEN 1 tablet (10 mg total) daily with breakfast for 7 days, THEN 0.5 tablets (5 mg total) daily with breakfast for 7 days. Willeen Niece, MD  Active   spironolactone (ALDACTONE) 25 MG tablet [id number] Yes Take 1 tablet (25 mg total) by mouth daily. Etta Grandchild, MD Taking Active   Vitamin D, Ergocalciferol, (DRISDOL) 1.25 MG (50000 UNIT) CAPS capsule [id number]  Take 1 capsule (50,000 Units total) by mouth every 7 (seven) days. Etta Grandchild, MD  Active Self, Pharmacy Records  zinc gluconate 50 MG tablet [id number]  Take 1 tablet (50 mg total) by mouth daily. Etta Grandchild, MD  Active Self, Pharmacy Records              Assessment/Plan:   Hypertension: - Currently controlled - Reviewed BP goal <130/80 - Recommend to ask wound clinic what BP is when she goes 10/8 next week - She will come in 10/8 for walk-in BMP to check potassium s/p med changes. Due to transportation reliance on her mother and distance, this is the earliest she can come. Likely will need to add back potassium chloride if still low.      Follow Up Plan: 10/9 at 3 PM telephone follow up  Arbutus Leas, PharmD, BCPS [facility] [phone]

## 2023-04-13 ENCOUNTER — Encounter (HOSPITAL_BASED_OUTPATIENT_CLINIC_OR_DEPARTMENT_OTHER): Payer: BC Managed Care – PPO | Attending: Internal Medicine | Admitting: Internal Medicine

## 2023-04-13 DIAGNOSIS — I890 Lymphedema, not elsewhere classified: Secondary | ICD-10-CM | POA: Diagnosis not present

## 2023-04-13 DIAGNOSIS — Z79620 Long term (current) use of immunosuppressive biologic: Secondary | ICD-10-CM | POA: Insufficient documentation

## 2023-04-13 DIAGNOSIS — L958 Other vasculitis limited to the skin: Secondary | ICD-10-CM | POA: Insufficient documentation

## 2023-04-13 DIAGNOSIS — L97528 Non-pressure chronic ulcer of other part of left foot with other specified severity: Secondary | ICD-10-CM | POA: Diagnosis not present

## 2023-04-13 DIAGNOSIS — L97518 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 severity: Secondary | ICD-10-CM | POA: Diagnosis not present

## 2023-04-13 DIAGNOSIS — L409 Psoriasis, unspecified: Secondary | ICD-10-CM | POA: Diagnosis not present

## 2023-04-14 ENCOUNTER — Other Ambulatory Visit: Payer: BC Managed Care – PPO | Admitting: Pharmacist

## 2023-04-14 NOTE — Progress Notes (Signed)
[name], [name] ([id number]) [id number] Page 1 of 13 Visit Report for 04/13/2023 Debridement Details Patient Name: Date of Service: GA MMA [name] 04/13/2023 8:45 A M Medical Record Number: [id number] Patient Account Number: [id number] Date of Birth/Sex: Treating RN: 25-Mar-1986 (37 y.o. F) Primary Care Provider: Sanda Linger Other Clinician: Referring Provider: Treating Provider/Extender: Madelyn Flavors in Treatment: 3 Debridement Performed for Assessment: Wound #1 Left,Medial Foot Performed By: Physician Maxwell Caul., MD The following information was scribed by: Shawn Stall The information was scribed for: Baltazar Najjar Debridement Type: Debridement Level of Consciousness (Pre-procedure): Awake and Alert Pre-procedure Verification/Time Out Yes - 09:35 Taken: Start Time: 09:36 Pain Control: Lidocaine 4% Topical Solution Percent of Wound Bed Debrided: 30% T Area Debrided (cm): otal 1.53 Tissue and other material debrided: Non-Viable, Eschar, Slough, Slough Level: Non-Viable Tissue Debridement Description: Selective/Open Wound Instrument: Curette Bleeding: Minimum Hemostasis Achieved: Pressure End Time: 09:42 Procedural Pain: 0 Post Procedural Pain: 0 Response to Treatment: Procedure was tolerated well Level of Consciousness (Post- Awake and Alert procedure): Post Debridement Measurements of Total Wound Length: (cm) 6.5 Width: (cm) 1 Depth: (cm) 0.1 Volume: (cm) 0.511 Character of Wound/Ulcer Post Debridement: Requires Further Debridement Post Procedure Diagnosis Same as Pre-procedure Electronic Signature(s) Signed: 04/14/2023 9:33:55 AM By: Baltazar Najjar MD Entered By: Baltazar Najjar on 04/13/2023 07:40:16 -------------------------------------------------------------------------------- Debridement Details Patient Name: Date of Service: [name], [name]. 04/13/2023 8:45 A M Medical Record  Number: [id number] Patient Account Number: [id number] Date of Birth/Sex: Treating RN: 1986-02-21 (37 y.o. F) Primary Care Provider: Sanda Linger Other Clinician: Referring Provider: Treating Provider/Extender: Madelyn Flavors in Treatment: 3 Debridement Performed for Assessment: Wound #3 Left,Proximal,Dorsal Foot Performed By: Physician Maxwell Caul., MD [name] ([id number]) [phone].pdf Page 2 of 13 The following information was scribed by: Shawn Stall The information was scribed for: Baltazar Najjar Debridement Type: Debridement Level of Consciousness (Pre-procedure): Awake and Alert Pre-procedure Verification/Time Out Yes - 09:35 Taken: Start Time: 09:36 Pain Control: Lidocaine 4% Topical Solution Percent of Wound Bed Debrided: 30% T Area Debrided (cm): otal 14.13 Tissue and other material debrided: Non-Viable, Eschar Level: Non-Viable Tissue Debridement Description: Selective/Open Wound Instrument: Curette Bleeding: Minimum Hemostasis Achieved: Pressure End Time: 09:42 Procedural Pain: 0 Post Procedural Pain: 0 Response to Treatment: Procedure was tolerated well Level of Consciousness (Post- Awake and Alert procedure): Post Debridement Measurements of Total Wound Length: (cm) 7.5 Width: (cm) 8 Depth: (cm) 0.2 Volume: (cm) 9.425 Character of Wound/Ulcer Post Debridement: Requires Further Debridement Post Procedure Diagnosis Same as Pre-procedure Electronic Signature(s) Signed: 04/14/2023 9:33:55 AM By: Baltazar Najjar MD Entered By: Baltazar Najjar on 04/13/2023 07:40:23 -------------------------------------------------------------------------------- Debridement Details Patient Name: Date of Service: [name], [name]. 04/13/2023 8:45 A M Medical Record Number: [id number] Patient Account Number: [id number] Date of Birth/Sex: Treating RN: [birth year] (37 y.o. F) Primary Care Provider: Sanda Linger Other  Clinician: Referring Provider: Treating Provider/Extender: Madelyn Flavors in Treatment: 3 Debridement Performed for Assessment: Wound #4 Right,Dorsal Foot Performed By: Physician Maxwell Caul., MD The following information was scribed by: Shawn Stall The information was scribed for: Baltazar Najjar Debridement Type: Debridement Level of Consciousness (Pre-procedure): Awake and Alert Pre-procedure Verification/Time Out Yes - 09:35 Taken: Start Time: 09:36 Pain Control: Lidocaine 4% Topical Solution Percent of Wound Bed Debrided: 50% T Area Debrided (cm): otal 12.56 Tissue and other material debrided: Non-Viable, Eschar, Slough, Slough Level: Non-Viable Tissue Debridement Description: Selective/Open Wound Instrument: Curette Bleeding:  Minimum Hemostasis Achieved: Pressure End Time: 09:42 Procedural Pain: 0 Post Procedural Pain: 0 Response to Treatment: Procedure was tolerated well Level of Consciousness [name], [name] ([id number]) [id number] Page 3 of 13 Level of Consciousness (Post- Awake and Alert procedure): Post Debridement Measurements of Total Wound Length: (cm) 4 Width: (cm) 8 Depth: (cm) 0.2 Volume: (cm) 5.027 Character of Wound/Ulcer Post Debridement: Requires Further Debridement Post Procedure Diagnosis Same as Pre-procedure Electronic Signature(s) Signed: 04/14/2023 9:33:55 AM By: Baltazar Najjar MD Entered By: Baltazar Najjar on 04/13/2023 07:40:31 -------------------------------------------------------------------------------- Debridement Details Patient Name: Date of Service: [name], [name]. 04/13/2023 8:45 A M Medical Record Number: [id number] Patient Account Number: [id number] Date of Birth/Sex: Treating RN: Jun 03, 1986 (37 y.o. F) Primary Care Provider: Sanda Linger Other Clinician: Referring Provider: Treating Provider/Extender: Madelyn Flavors in Treatment:  3 Debridement Performed for Assessment: Wound #5 Right,Lateral Calcaneus Performed By: Physician Maxwell Caul., MD The following information was scribed by: Shawn Stall The information was scribed for: Baltazar Najjar Debridement Type: Debridement Level of Consciousness (Pre-procedure): Awake and Alert Pre-procedure Verification/Time Out Yes - 09:35 Taken: Start Time: 09:36 Pain Control: Lidocaine 4% T opical Solution Percent of Wound Bed Debrided: 100% T Area Debrided (cm): otal 1.02 Tissue and other material debrided: Non-Viable, Slough, Slough Level: Non-Viable Tissue Debridement Description: Selective/Open Wound Instrument: Curette Bleeding: Minimum Hemostasis Achieved: Pressure End Time: 09:42 Procedural Pain: 0 Post Procedural Pain: 0 Response to Treatment: Procedure was tolerated well Level of Consciousness (Post- Awake and Alert procedure): Post Debridement Measurements of Total Wound Length: (cm) 1 Width: (cm) 1.3 Depth: (cm) 0.1 Volume: (cm) 0.102 Character of Wound/Ulcer Post Debridement: Requires Further Debridement Post Procedure Diagnosis Same as Pre-procedure Electronic Signature(s) Signed: 04/14/2023 9:33:55 AM By: Baltazar Najjar MD Entered By: Baltazar Najjar on 04/13/2023 07:40:38 Hillis Range, [name] ([id number]) [id number] Page 4 of 13 -------------------------------------------------------------------------------- HPI Details Patient Name: Date of Service: GA MMA [name] 04/13/2023 8:45 A M Medical Record Number: [id number] Patient Account Number: [id number] Date of Birth/Sex: Treating RN: 02-27-1986 (37 y.o. F) Primary Care Provider: Sanda Linger Other Clinician: Referring Provider: Treating Provider/Extender: Madelyn Flavors in Treatment: 3 History of Present Illness HPI Description: ADMISSION 03/23/2023 This is a 37 year old woman who is Story is somewhat difficult to follow however  she apparently has developed bilateral lower extremity swelling involving her feet and ankles roughly 3 weeks ago. This but this became associated with small painful areas on predominantly her left greater than right foot with exfoliation of the skin. She was admitted to hospital from 03/16/2023 through 03/19/2023. X-ray of the feet was negative however she was noted to have a sedimentation rate greater than 90. Although she was empirically given antibiotics at the start of the admission. She was seen by infectious disease who did not feel this was infectious. It was felt that she required a skin biopsy for possible vasculitis. Dermatology and rheumatology consults were suggested. She was discharged from the hospital on a gradually tapering dose of prednisone. She had cryoglobulins, complement levels measured. She did not have an active urinalysis. She has been soaking her feet in ice water and cold towels to relieve the pain. She saw her dermatologist who is in [place] yesterday for consideration of a skin biopsy. Per the patient's description the dermatologist did not wish to do a skin biopsy stating that he did not want to create another wound area although I do not have his or her notes at this point  [name], [name] ([id number]) [id number] Page 1 of 13 Visit Report for 04/13/2023 Debridement Details Patient Name: Date of Service: GA MMA [name] 04/13/2023 8:45 A M Medical Record Number: [id number] Patient Account Number: [id number] Date of Birth/Sex: Treating RN: 25-Mar-1986 (37 y.o. F) Primary Care Provider: Sanda Linger Other Clinician: Referring Provider: Treating Provider/Extender: Madelyn Flavors in Treatment: 3 Debridement Performed for Assessment: Wound #1 Left,Medial Foot Performed By: Physician Maxwell Caul., MD The following information was scribed by: Shawn Stall The information was scribed for: Baltazar Najjar Debridement Type: Debridement Level of Consciousness (Pre-procedure): Awake and Alert Pre-procedure Verification/Time Out Yes - 09:35 Taken: Start Time: 09:36 Pain Control: Lidocaine 4% Topical Solution Percent of Wound Bed Debrided: 30% T Area Debrided (cm): otal 1.53 Tissue and other material debrided: Non-Viable, Eschar, Slough, Slough Level: Non-Viable Tissue Debridement Description: Selective/Open Wound Instrument: Curette Bleeding: Minimum Hemostasis Achieved: Pressure End Time: 09:42 Procedural Pain: 0 Post Procedural Pain: 0 Response to Treatment: Procedure was tolerated well Level of Consciousness (Post- Awake and Alert procedure): Post Debridement Measurements of Total Wound Length: (cm) 6.5 Width: (cm) 1 Depth: (cm) 0.1 Volume: (cm) 0.511 Character of Wound/Ulcer Post Debridement: Requires Further Debridement Post Procedure Diagnosis Same as Pre-procedure Electronic Signature(s) Signed: 04/14/2023 9:33:55 AM By: Baltazar Najjar MD Entered By: Baltazar Najjar on 04/13/2023 07:40:16 -------------------------------------------------------------------------------- Debridement Details Patient Name: Date of Service: [name], [name]. 04/13/2023 8:45 A M Medical Record  Number: [id number] Patient Account Number: [id number] Date of Birth/Sex: Treating RN: 1986-02-21 (37 y.o. F) Primary Care Provider: Sanda Linger Other Clinician: Referring Provider: Treating Provider/Extender: Madelyn Flavors in Treatment: 3 Debridement Performed for Assessment: Wound #3 Left,Proximal,Dorsal Foot Performed By: Physician Maxwell Caul., MD [name] ([id number]) [phone].pdf Page 2 of 13 The following information was scribed by: Shawn Stall The information was scribed for: Baltazar Najjar Debridement Type: Debridement Level of Consciousness (Pre-procedure): Awake and Alert Pre-procedure Verification/Time Out Yes - 09:35 Taken: Start Time: 09:36 Pain Control: Lidocaine 4% Topical Solution Percent of Wound Bed Debrided: 30% T Area Debrided (cm): otal 14.13 Tissue and other material debrided: Non-Viable, Eschar Level: Non-Viable Tissue Debridement Description: Selective/Open Wound Instrument: Curette Bleeding: Minimum Hemostasis Achieved: Pressure End Time: 09:42 Procedural Pain: 0 Post Procedural Pain: 0 Response to Treatment: Procedure was tolerated well Level of Consciousness (Post- Awake and Alert procedure): Post Debridement Measurements of Total Wound Length: (cm) 7.5 Width: (cm) 8 Depth: (cm) 0.2 Volume: (cm) 9.425 Character of Wound/Ulcer Post Debridement: Requires Further Debridement Post Procedure Diagnosis Same as Pre-procedure Electronic Signature(s) Signed: 04/14/2023 9:33:55 AM By: Baltazar Najjar MD Entered By: Baltazar Najjar on 04/13/2023 07:40:23 -------------------------------------------------------------------------------- Debridement Details Patient Name: Date of Service: [name], [name]. 04/13/2023 8:45 A M Medical Record Number: [id number] Patient Account Number: [id number] Date of Birth/Sex: Treating RN: [birth year] (37 y.o. F) Primary Care Provider: Sanda Linger Other  Clinician: Referring Provider: Treating Provider/Extender: Madelyn Flavors in Treatment: 3 Debridement Performed for Assessment: Wound #4 Right,Dorsal Foot Performed By: Physician Maxwell Caul., MD The following information was scribed by: Shawn Stall The information was scribed for: Baltazar Najjar Debridement Type: Debridement Level of Consciousness (Pre-procedure): Awake and Alert Pre-procedure Verification/Time Out Yes - 09:35 Taken: Start Time: 09:36 Pain Control: Lidocaine 4% Topical Solution Percent of Wound Bed Debrided: 50% T Area Debrided (cm): otal 12.56 Tissue and other material debrided: Non-Viable, Eschar, Slough, Slough Level: Non-Viable Tissue Debridement Description: Selective/Open Wound Instrument: Curette Bleeding:  [name], [name] ([id number]) [id number] Page 1 of 13 Visit Report for 04/13/2023 Debridement Details Patient Name: Date of Service: GA MMA [name] 04/13/2023 8:45 A M Medical Record Number: [id number] Patient Account Number: [id number] Date of Birth/Sex: Treating RN: 25-Mar-1986 (37 y.o. F) Primary Care Provider: Sanda Linger Other Clinician: Referring Provider: Treating Provider/Extender: Madelyn Flavors in Treatment: 3 Debridement Performed for Assessment: Wound #1 Left,Medial Foot Performed By: Physician Maxwell Caul., MD The following information was scribed by: Shawn Stall The information was scribed for: Baltazar Najjar Debridement Type: Debridement Level of Consciousness (Pre-procedure): Awake and Alert Pre-procedure Verification/Time Out Yes - 09:35 Taken: Start Time: 09:36 Pain Control: Lidocaine 4% Topical Solution Percent of Wound Bed Debrided: 30% T Area Debrided (cm): otal 1.53 Tissue and other material debrided: Non-Viable, Eschar, Slough, Slough Level: Non-Viable Tissue Debridement Description: Selective/Open Wound Instrument: Curette Bleeding: Minimum Hemostasis Achieved: Pressure End Time: 09:42 Procedural Pain: 0 Post Procedural Pain: 0 Response to Treatment: Procedure was tolerated well Level of Consciousness (Post- Awake and Alert procedure): Post Debridement Measurements of Total Wound Length: (cm) 6.5 Width: (cm) 1 Depth: (cm) 0.1 Volume: (cm) 0.511 Character of Wound/Ulcer Post Debridement: Requires Further Debridement Post Procedure Diagnosis Same as Pre-procedure Electronic Signature(s) Signed: 04/14/2023 9:33:55 AM By: Baltazar Najjar MD Entered By: Baltazar Najjar on 04/13/2023 07:40:16 -------------------------------------------------------------------------------- Debridement Details Patient Name: Date of Service: [name], [name]. 04/13/2023 8:45 A M Medical Record  Number: [id number] Patient Account Number: [id number] Date of Birth/Sex: Treating RN: 1986-02-21 (37 y.o. F) Primary Care Provider: Sanda Linger Other Clinician: Referring Provider: Treating Provider/Extender: Madelyn Flavors in Treatment: 3 Debridement Performed for Assessment: Wound #3 Left,Proximal,Dorsal Foot Performed By: Physician Maxwell Caul., MD [name] ([id number]) [phone].pdf Page 2 of 13 The following information was scribed by: Shawn Stall The information was scribed for: Baltazar Najjar Debridement Type: Debridement Level of Consciousness (Pre-procedure): Awake and Alert Pre-procedure Verification/Time Out Yes - 09:35 Taken: Start Time: 09:36 Pain Control: Lidocaine 4% Topical Solution Percent of Wound Bed Debrided: 30% T Area Debrided (cm): otal 14.13 Tissue and other material debrided: Non-Viable, Eschar Level: Non-Viable Tissue Debridement Description: Selective/Open Wound Instrument: Curette Bleeding: Minimum Hemostasis Achieved: Pressure End Time: 09:42 Procedural Pain: 0 Post Procedural Pain: 0 Response to Treatment: Procedure was tolerated well Level of Consciousness (Post- Awake and Alert procedure): Post Debridement Measurements of Total Wound Length: (cm) 7.5 Width: (cm) 8 Depth: (cm) 0.2 Volume: (cm) 9.425 Character of Wound/Ulcer Post Debridement: Requires Further Debridement Post Procedure Diagnosis Same as Pre-procedure Electronic Signature(s) Signed: 04/14/2023 9:33:55 AM By: Baltazar Najjar MD Entered By: Baltazar Najjar on 04/13/2023 07:40:23 -------------------------------------------------------------------------------- Debridement Details Patient Name: Date of Service: [name], [name]. 04/13/2023 8:45 A M Medical Record Number: [id number] Patient Account Number: [id number] Date of Birth/Sex: Treating RN: [birth year] (37 y.o. F) Primary Care Provider: Sanda Linger Other  Clinician: Referring Provider: Treating Provider/Extender: Madelyn Flavors in Treatment: 3 Debridement Performed for Assessment: Wound #4 Right,Dorsal Foot Performed By: Physician Maxwell Caul., MD The following information was scribed by: Shawn Stall The information was scribed for: Baltazar Najjar Debridement Type: Debridement Level of Consciousness (Pre-procedure): Awake and Alert Pre-procedure Verification/Time Out Yes - 09:35 Taken: Start Time: 09:36 Pain Control: Lidocaine 4% Topical Solution Percent of Wound Bed Debrided: 50% T Area Debrided (cm): otal 12.56 Tissue and other material debrided: Non-Viable, Eschar, Slough, Slough Level: Non-Viable Tissue Debridement Description: Selective/Open Wound Instrument: Curette Bleeding:  Cleanse the wound with Vashe prior to applying a clean dressing using gauze sponges, not tissue or cotton balls. Peri-Wound Care: Triamcinolone 15 (g) 1 x Per Week/30 Days Discharge Instructions: Use triamcinolone ****liberally*** Peri-Wound Care: Sween Lotion (Moisturizing lotion) 1 x Per Week/30 Days Discharge Instructions: Apply moisturizing lotion as directed Topical: Triamcinolone 1 x Per Week/30 Days Discharge Instructions: Apply Triamcinolone as directed liberallly Prim Dressing: Hydrofera Blue Ready  Transfer Foam, 2.5x2.5 (in/in) 1 x Per Week/30 Days ary Discharge Instructions: Apply directly to wound bed as directed Prim Dressing: Santyl Ointment 1 x Per Week/30 Days ary Discharge Instructions: Apply nickel thick amount to wound bed as instructed Secondary Dressing: ABD Pad, 8x10 1 x Per Week/30 Days Discharge Instructions: Apply over primary dressing as directed. Secondary Dressing: Woven Gauze Sponge, Non-Sterile 4x4 in 1 x Per Week/30 Days Discharge Instructions: Apply over primary dressing as directed. Com pression Wrap: Kerlix Roll 4.5x3.1 (in/yd) 1 x Per Week/30 Days Discharge Instructions: Apply Kerlix and Coban compression as directed. Com pression Wrap: Coban Self-Adherent Wrap 4x5 (in/yd) 1 x Per Week/30 Days Discharge Instructions: Apply over Kerlix as directed. ****UNNA BOOT FIRST LAYER TO UPPER PORTION OF LOWER LEGS AND TO DORSAL FEET TO SECURE WRAPS IN PLACE. 1. The condition of her feet continues to gradually improve especially the uninvolved skin. 2. Most of her punched-out areas with the adherent black eschar are now loosening and I was able to debride some of these. 3. Continue with Santyl and Hydrofera Blue and continue under compression for now 4. Still with TCA liberally to her feet under compression 5. I am going to outline a more aggressive tapering course of the prednisone I hope she is able to tolerate this in terms of absence of further skin lesions and hopefully not steroid withdrawal. I have talked about this to her in some detail. In keeping with this I am going to give her 3 days of 20 mg 3 days of 10 mg and 3 days of 5 mg that I will order today. 6 I am not sure what she is doing to the wraps at home but they come in slipping down I have asked her to try to keep these on in an appropriately place 7. If she gets more lesions I think she will need a biopsy. I have not been fond of biopsying this type of thing in this clinic as more often than not they  turned out to be nondiagnostic based on our pathology review. 8. I do not think all of this was venous insufficiency and lymphedema which her third dermatologist in [place] seemed to think. 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 Unfortunately a biopsy was not done Electronic Signature(s) Signed: 04/14/2023 9:33:55 AM By: Baltazar Najjar MD Entered By: Baltazar Najjar on 04/13/2023 07:47:08 -------------------------------------------------------------------------------- SuperBill Details Patient Name: Date of Service: [name], [name]. 04/13/2023 Medical Record Number: [id number] Patient Account Number: [id number] Date of Birth/Sex: Treating RN: March 29, 1986 (37 y.o. 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 Peri-Wound Care: Triamcinolone 15 (g) 1 x Per Week/30 Days Discharge Instructions: Use triamcinolone ****liberally*** Peri-Wound Care: Sween Lotion (Moisturizing lotion) 1 x Per Week/30 Days Discharge Instructions: Apply moisturizing lotion as directed Topical: Triamcinolone 1 x Per Week/30 Days Discharge Instructions: Apply Triamcinolone as directed liberallly Prim Dressing: Hydrofera Blue Ready  Transfer Foam, 2.5x2.5 (in/in) 1 x Per Week/30 Days ary Discharge Instructions: Apply directly to wound bed as directed Prim Dressing: Santyl Ointment 1 x Per Week/30 Days ary Discharge Instructions: Apply nickel thick amount to wound bed as instructed Secondary Dressing: ABD Pad, 8x10 1 x Per Week/30 Days Discharge Instructions: Apply over primary dressing as directed. Secondary Dressing: Woven Gauze Sponge, Non-Sterile 4x4 in 1 x Per Week/30 Days Discharge Instructions: Apply over primary dressing as directed. Com pression Wrap: Kerlix Roll 4.5x3.1 (in/yd) 1 x Per Week/30 Days Discharge Instructions: Apply Kerlix and Coban compression as directed. Com pression Wrap: Coban Self-Adherent Wrap 4x5 (in/yd) 1 x Per Week/30 Days Discharge Instructions: Apply over Kerlix as directed. ****UNNA BOOT FIRST LAYER TO UPPER PORTION OF LOWER LEGS AND TO DORSAL FEET TO SECURE WRAPS IN PLACE. 1. The condition of her feet continues to gradually improve especially the uninvolved skin. 2. Most of her punched-out areas with the adherent black eschar are now loosening and I was able to debride some of these. 3. Continue with Santyl and Hydrofera Blue and continue under compression for now 4. Still with TCA liberally to her feet under compression 5. I am going to outline a more aggressive tapering course of the prednisone I hope she is able to tolerate this in terms of absence of further skin lesions and hopefully not steroid withdrawal. I have talked about this to her in some detail. In keeping with this I am going to give her 3 days of 20 mg 3 days of 10 mg and 3 days of 5 mg that I will order today. 6 I am not sure what she is doing to the wraps at home but they come in slipping down I have asked her to try to keep these on in an appropriately place 7. If she gets more lesions I think she will need a biopsy. I have not been fond of biopsying this type of thing in this clinic as more often than not they  turned out to be nondiagnostic based on our pathology review. 8. I do not think all of this was venous insufficiency and lymphedema which her third dermatologist in [place] seemed to think. 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 Unfortunately a biopsy was not done Electronic Signature(s) Signed: 04/14/2023 9:33:55 AM By: Baltazar Najjar MD Entered By: Baltazar Najjar on 04/13/2023 07:47:08 -------------------------------------------------------------------------------- SuperBill Details Patient Name: Date of Service: [name], [name]. 04/13/2023 Medical Record Number: [id number] Patient Account Number: [id number] Date of Birth/Sex: Treating RN: March 29, 1986 (37 y.o. 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 Peri-Wound Care: Triamcinolone 15 (g) 1 x Per Week/30 Days Discharge Instructions: Use triamcinolone ****liberally*** Peri-Wound Care: Sween Lotion (Moisturizing lotion) 1 x Per Week/30 Days Discharge Instructions: Apply moisturizing lotion as directed Topical: Triamcinolone 1 x Per Week/30 Days Discharge Instructions: Apply Triamcinolone as directed liberallly Prim Dressing: Hydrofera Blue Ready  Transfer Foam, 2.5x2.5 (in/in) 1 x Per Week/30 Days ary Discharge Instructions: Apply directly to wound bed as directed Prim Dressing: Santyl Ointment 1 x Per Week/30 Days ary Discharge Instructions: Apply nickel thick amount to wound bed as instructed Secondary Dressing: ABD Pad, 8x10 1 x Per Week/30 Days Discharge Instructions: Apply over primary dressing as directed. Secondary Dressing: Woven Gauze Sponge, Non-Sterile 4x4 in 1 x Per Week/30 Days Discharge Instructions: Apply over primary dressing as directed. Com pression Wrap: Kerlix Roll 4.5x3.1 (in/yd) 1 x Per Week/30 Days Discharge Instructions: Apply Kerlix and Coban compression as directed. Com pression Wrap: Coban Self-Adherent Wrap 4x5 (in/yd) 1 x Per Week/30 Days Discharge Instructions: Apply over Kerlix as directed. ****UNNA BOOT FIRST LAYER TO UPPER PORTION OF LOWER LEGS AND TO DORSAL FEET TO SECURE WRAPS IN PLACE. 1. The condition of her feet continues to gradually improve especially the uninvolved skin. 2. Most of her punched-out areas with the adherent black eschar are now loosening and I was able to debride some of these. 3. Continue with Santyl and Hydrofera Blue and continue under compression for now 4. Still with TCA liberally to her feet under compression 5. I am going to outline a more aggressive tapering course of the prednisone I hope she is able to tolerate this in terms of absence of further skin lesions and hopefully not steroid withdrawal. I have talked about this to her in some detail. In keeping with this I am going to give her 3 days of 20 mg 3 days of 10 mg and 3 days of 5 mg that I will order today. 6 I am not sure what she is doing to the wraps at home but they come in slipping down I have asked her to try to keep these on in an appropriately place 7. If she gets more lesions I think she will need a biopsy. I have not been fond of biopsying this type of thing in this clinic as more often than not they  turned out to be nondiagnostic based on our pathology review. 8. I do not think all of this was venous insufficiency and lymphedema which her third dermatologist in [place] seemed to think. 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 Unfortunately a biopsy was not done Electronic Signature(s) Signed: 04/14/2023 9:33:55 AM By: Baltazar Najjar MD Entered By: Baltazar Najjar on 04/13/2023 07:47:08 -------------------------------------------------------------------------------- SuperBill Details Patient Name: Date of Service: [name], [name]. 04/13/2023 Medical Record Number: [id number] Patient Account Number: [id number] Date of Birth/Sex: Treating RN: March 29, 1986 (37 y.o. 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 04/13/2023 8:45 A M Medical Record Number: [id number] Patient Account Number: [id number] Date of Birth/Sex: Treating RN: 25-Mar-1986 (37 y.o. F) Primary Care Provider: Sanda Linger Other Clinician: Referring Provider: Treating Provider/Extender: Madelyn Flavors in Treatment: 3 Debridement Performed for Assessment: Wound #1 Left,Medial Foot Performed By: Physician Maxwell Caul., MD The following information was scribed by: Shawn Stall The information was scribed for: Baltazar Najjar Debridement Type: Debridement Level of Consciousness (Pre-procedure): Awake and Alert Pre-procedure Verification/Time Out Yes - 09:35 Taken: Start Time: 09:36 Pain Control: Lidocaine 4% Topical Solution Percent of Wound Bed Debrided: 30% T Area Debrided (cm): otal 1.53 Tissue and other material debrided: Non-Viable, Eschar, Slough, Slough Level: Non-Viable Tissue Debridement Description: Selective/Open Wound Instrument: Curette Bleeding: Minimum Hemostasis Achieved: Pressure End Time: 09:42 Procedural Pain: 0 Post Procedural Pain: 0 Response to Treatment: Procedure was tolerated well Level of Consciousness (Post- Awake and Alert procedure): Post Debridement Measurements of Total Wound Length: (cm) 6.5 Width: (cm) 1 Depth: (cm) 0.1 Volume: (cm) 0.511 Character of Wound/Ulcer Post Debridement: Requires Further Debridement Post Procedure Diagnosis Same as Pre-procedure Electronic Signature(s) Signed: 04/14/2023 9:33:55 AM By: Baltazar Najjar MD Entered By: Baltazar Najjar on 04/13/2023 07:40:16 -------------------------------------------------------------------------------- Debridement Details Patient Name: Date of Service: [name], [name]. 04/13/2023 8:45 A M Medical Record  Number: [id number] Patient Account Number: [id number] Date of Birth/Sex: Treating RN: 1986-02-21 (37 y.o. F) Primary Care Provider: Sanda Linger Other Clinician: Referring Provider: Treating Provider/Extender: Madelyn Flavors in Treatment: 3 Debridement Performed for Assessment: Wound #3 Left,Proximal,Dorsal Foot Performed By: Physician Maxwell Caul., MD [name] ([id number]) [phone].pdf Page 2 of 13 The following information was scribed by: Shawn Stall The information was scribed for: Baltazar Najjar Debridement Type: Debridement Level of Consciousness (Pre-procedure): Awake and Alert Pre-procedure Verification/Time Out Yes - 09:35 Taken: Start Time: 09:36 Pain Control: Lidocaine 4% Topical Solution Percent of Wound Bed Debrided: 30% T Area Debrided (cm): otal 14.13 Tissue and other material debrided: Non-Viable, Eschar Level: Non-Viable Tissue Debridement Description: Selective/Open Wound Instrument: Curette Bleeding: Minimum Hemostasis Achieved: Pressure End Time: 09:42 Procedural Pain: 0 Post Procedural Pain: 0 Response to Treatment: Procedure was tolerated well Level of Consciousness (Post- Awake and Alert procedure): Post Debridement Measurements of Total Wound Length: (cm) 7.5 Width: (cm) 8 Depth: (cm) 0.2 Volume: (cm) 9.425 Character of Wound/Ulcer Post Debridement: Requires Further Debridement Post Procedure Diagnosis Same as Pre-procedure Electronic Signature(s) Signed: 04/14/2023 9:33:55 AM By: Baltazar Najjar MD Entered By: Baltazar Najjar on 04/13/2023 07:40:23 -------------------------------------------------------------------------------- Debridement Details Patient Name: Date of Service: [name], [name]. 04/13/2023 8:45 A M Medical Record Number: [id number] Patient Account Number: [id number] Date of Birth/Sex: Treating RN: [birth year] (37 y.o. F) Primary Care Provider: Sanda Linger Other  Clinician: Referring Provider: Treating Provider/Extender: Madelyn Flavors in Treatment: 3 Debridement Performed for Assessment: Wound #4 Right,Dorsal Foot Performed By: Physician Maxwell Caul., MD The following information was scribed by: Shawn Stall The information was scribed for: Baltazar Najjar Debridement Type: Debridement Level of Consciousness (Pre-procedure): Awake and Alert Pre-procedure Verification/Time Out Yes - 09:35 Taken: Start Time: 09:36 Pain Control: Lidocaine 4% Topical Solution Percent of Wound Bed Debrided: 50% T Area Debrided (cm): otal 12.56 Tissue and other material debrided: Non-Viable, Eschar, Slough, Slough Level: Non-Viable Tissue Debridement Description: Selective/Open Wound Instrument: Curette Bleeding:  [name], [name] ([id number]) [id number] Page 1 of 13 Visit Report for 04/13/2023 Debridement Details Patient Name: Date of Service: GA MMA [name] 04/13/2023 8:45 A M Medical Record Number: [id number] Patient Account Number: [id number] Date of Birth/Sex: Treating RN: 25-Mar-1986 (37 y.o. F) Primary Care Provider: Sanda Linger Other Clinician: Referring Provider: Treating Provider/Extender: Madelyn Flavors in Treatment: 3 Debridement Performed for Assessment: Wound #1 Left,Medial Foot Performed By: Physician Maxwell Caul., MD The following information was scribed by: Shawn Stall The information was scribed for: Baltazar Najjar Debridement Type: Debridement Level of Consciousness (Pre-procedure): Awake and Alert Pre-procedure Verification/Time Out Yes - 09:35 Taken: Start Time: 09:36 Pain Control: Lidocaine 4% Topical Solution Percent of Wound Bed Debrided: 30% T Area Debrided (cm): otal 1.53 Tissue and other material debrided: Non-Viable, Eschar, Slough, Slough Level: Non-Viable Tissue Debridement Description: Selective/Open Wound Instrument: Curette Bleeding: Minimum Hemostasis Achieved: Pressure End Time: 09:42 Procedural Pain: 0 Post Procedural Pain: 0 Response to Treatment: Procedure was tolerated well Level of Consciousness (Post- Awake and Alert procedure): Post Debridement Measurements of Total Wound Length: (cm) 6.5 Width: (cm) 1 Depth: (cm) 0.1 Volume: (cm) 0.511 Character of Wound/Ulcer Post Debridement: Requires Further Debridement Post Procedure Diagnosis Same as Pre-procedure Electronic Signature(s) Signed: 04/14/2023 9:33:55 AM By: Baltazar Najjar MD Entered By: Baltazar Najjar on 04/13/2023 07:40:16 -------------------------------------------------------------------------------- Debridement Details Patient Name: Date of Service: [name], [name]. 04/13/2023 8:45 A M Medical Record  Number: [id number] Patient Account Number: [id number] Date of Birth/Sex: Treating RN: 1986-02-21 (37 y.o. F) Primary Care Provider: Sanda Linger Other Clinician: Referring Provider: Treating Provider/Extender: Madelyn Flavors in Treatment: 3 Debridement Performed for Assessment: Wound #3 Left,Proximal,Dorsal Foot Performed By: Physician Maxwell Caul., MD [name] ([id number]) [phone].pdf Page 2 of 13 The following information was scribed by: Shawn Stall The information was scribed for: Baltazar Najjar Debridement Type: Debridement Level of Consciousness (Pre-procedure): Awake and Alert Pre-procedure Verification/Time Out Yes - 09:35 Taken: Start Time: 09:36 Pain Control: Lidocaine 4% Topical Solution Percent of Wound Bed Debrided: 30% T Area Debrided (cm): otal 14.13 Tissue and other material debrided: Non-Viable, Eschar Level: Non-Viable Tissue Debridement Description: Selective/Open Wound Instrument: Curette Bleeding: Minimum Hemostasis Achieved: Pressure End Time: 09:42 Procedural Pain: 0 Post Procedural Pain: 0 Response to Treatment: Procedure was tolerated well Level of Consciousness (Post- Awake and Alert procedure): Post Debridement Measurements of Total Wound Length: (cm) 7.5 Width: (cm) 8 Depth: (cm) 0.2 Volume: (cm) 9.425 Character of Wound/Ulcer Post Debridement: Requires Further Debridement Post Procedure Diagnosis Same as Pre-procedure Electronic Signature(s) Signed: 04/14/2023 9:33:55 AM By: Baltazar Najjar MD Entered By: Baltazar Najjar on 04/13/2023 07:40:23 -------------------------------------------------------------------------------- Debridement Details Patient Name: Date of Service: [name], [name]. 04/13/2023 8:45 A M Medical Record Number: [id number] Patient Account Number: [id number] Date of Birth/Sex: Treating RN: [birth year] (37 y.o. F) Primary Care Provider: Sanda Linger Other  Clinician: Referring Provider: Treating Provider/Extender: Madelyn Flavors in Treatment: 3 Debridement Performed for Assessment: Wound #4 Right,Dorsal Foot Performed By: Physician Maxwell Caul., MD The following information was scribed by: Shawn Stall The information was scribed for: Baltazar Najjar Debridement Type: Debridement Level of Consciousness (Pre-procedure): Awake and Alert Pre-procedure Verification/Time Out Yes - 09:35 Taken: Start Time: 09:36 Pain Control: Lidocaine 4% Topical Solution Percent of Wound Bed Debrided: 50% T Area Debrided (cm): otal 12.56 Tissue and other material debrided: Non-Viable, Eschar, Slough, Slough Level: Non-Viable Tissue Debridement Description: Selective/Open Wound Instrument: Curette Bleeding:  PORTION OF LOWER LEGS AND TO DORSAL FEET TO SECURE WRAPS IN PLACE. Wound #4 - Foot Wound Laterality: Dorsal, Right Cleanser: Soap and Water 1 x Per Week/30 Days Discharge Instructions: May shower and wash wound with dial antibacterial soap and water prior to dressing change. Cleanser: Vashe 5.8 (oz) 1 x Per Week/30 Days Discharge Instructions: Cleanse the wound with Vashe prior to applying a clean dressing using gauze sponges, not tissue or cotton balls. Peri-Wound Care: Triamcinolone 15 (g) 1 x Per Week/30 Days Discharge Instructions: Use triamcinolone ****liberally*** Peri-Wound Care: Sween Lotion (Moisturizing lotion) 1 x Per Week/30 Days Discharge Instructions: Apply moisturizing lotion as directed Topical: Triamcinolone 1 x Per Week/30 Days Discharge Instructions: Apply  Triamcinolone as directed liberallly Prim Dressing: Hydrofera Blue Ready Transfer Foam, 2.5x2.5 (in/in) 1 x Per Week/30 Days ary Discharge Instructions: Apply directly to wound bed as directed Prim Dressing: Santyl Ointment 1 x Per Week/30 Days ary Discharge Instructions: Apply nickel thick amount to wound bed as instructed Secondary Dressing: ABD Pad, 8x10 1 x Per Week/30 Days Discharge Instructions: Apply over primary dressing as directed. Secondary Dressing: Woven Gauze Sponge, Non-Sterile 4x4 in 1 x Per Week/30 Days Discharge Instructions: Apply over primary dressing as directed. Compression Wrap: Kerlix Roll 4.5x3.1 (in/yd) 1 x Per Week/30 Days Discharge Instructions: Apply Kerlix and Coban compression as directed. Compression Wrap: Coban Self-Adherent Wrap 4x5 (in/yd) 1 x Per Week/30 Days Discharge Instructions: Apply over Kerlix as directed. ****UNNA BOOT FIRST LAYER TO UPPER PORTION OF LOWER LEGS AND TO DORSAL FEET TO SECURE WRAPS IN PLACE. Wound #5 - Calcaneus Wound Laterality: Right, Lateral Cleanser: Soap and Water 1 x Per Week/30 Days Discharge Instructions: May shower and wash wound with dial antibacterial soap and water prior to dressing change. Cleanser: Vashe 5.8 (oz) 1 x Per Week/30 Days Discharge Instructions: Cleanse the wound with Vashe prior to applying a clean dressing using gauze sponges, not tissue or cotton balls. Peri-Wound Care: Triamcinolone 15 (g) 1 x Per Week/30 Days Discharge Instructions: Use triamcinolone ****liberally*** Peri-Wound Care: Sween Lotion (Moisturizing lotion) 1 x Per Week/30 Days Discharge Instructions: Apply moisturizing lotion as directed Topical: Triamcinolone 1 x Per Week/30 Days Discharge Instructions: Apply Triamcinolone as directed liberallly Prim Dressing: Hydrofera Blue Ready Transfer Foam, 2.5x2.5 (in/in) 1 x Per Week/30 Days ary Discharge Instructions: Apply directly to wound bed as directed Prim Dressing: Santyl Ointment 1 x Per  Week/30 Days ary Discharge Instructions: Apply nickel thick amount to wound bed as instructed Secondary Dressing: ABD Pad, 8x10 1 x Per Week/30 Days Discharge Instructions: Apply over primary dressing as directed. [name], [name] ([id number]) [id number] Page 8 of 13 Secondary Dressing: Woven Gauze Sponge, Non-Sterile 4x4 in 1 x Per Week/30 Days Discharge Instructions: Apply over primary dressing as directed. Compression Wrap: Kerlix Roll 4.5x3.1 (in/yd) 1 x Per Week/30 Days Discharge Instructions: Apply Kerlix and Coban compression as directed. Compression Wrap: Coban Self-Adherent Wrap 4x5 (in/yd) 1 x Per Week/30 Days Discharge Instructions: Apply over Kerlix as directed. ****UNNA BOOT FIRST LAYER TO UPPER PORTION OF LOWER LEGS AND TO DORSAL FEET TO SECURE WRAPS IN PLACE. Electronic Signature(s) Signed: 04/13/2023 5:32:42 PM By: Shawn Stall RN, BSN Signed: 04/14/2023 9:33:55 AM By: Baltazar Najjar MD Entered By: Shawn Stall on 04/13/2023 06:50:10 -------------------------------------------------------------------------------- Problem List Details Patient Name: Date of Service: [name], [name]. 04/13/2023 8:45 A M Medical Record Number: [id number] Patient Account Number: [id number] Date of Birth/Sex: Treating RN: 1986-02-27 (37 y.o. 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 04/13/2023 8:45 A M Medical Record Number: [id number] Patient Account Number: [id number] Date of Birth/Sex: Treating RN: 1986/06/14 (37 y.o. [name], [name] ([id number]) [id number] Page 9 of 13 Primary Care Provider: Sanda Linger Other Clinician: Referring Provider: Treating Provider/Extender: Madelyn Flavors in Treatment: 3 Subjective History of Present Illness (HPI) ADMISSION 03/23/2023 This is a 37 year old woman who is Story is somewhat difficult to follow however she apparently has developed bilateral lower extremity swelling involving her feet and ankles roughly 3 weeks ago. This but this became associated with small painful areas on predominantly her left greater than right foot with exfoliation of the skin. She was admitted to hospital from 03/16/2023 through 03/19/2023. X-ray of the feet was negative however she was noted to have a sedimentation rate greater than 90. Although she was empirically given antibiotics at the start of the admission. She was seen by infectious disease who did not feel this was infectious. It was felt that she required a skin biopsy for possible vasculitis. Dermatology and rheumatology consults were suggested. She was discharged from the hospital on a gradually tapering dose of prednisone. She had cryoglobulins, complement levels measured. She did not have an active urinalysis. She has been soaking her feet in ice water and cold towels to relieve the pain. She saw her dermatologist who is in [place] yesterday for consideration of a skin biopsy. Per the patient's description the dermatologist did not wish to do a skin biopsy stating that he did not want to create another wound area  although I do not have his or her notes at this point and I do not know what diagnosis she was felt to have. She arrives in clinic today with discomfort in her feet. Exfoliating skin on the dorsal surfaces of both feet punched-out areas with necrosis especially on the left dorsal foot left lateral ankle. There is also areas on the right heel. Past medical history includes acute hepatitis question involving alcohol or hepatitis A, bilateral leg edema, obesity, hypertension, history of psoriasis on [place], chronic pain, apparently some form of idiopathic peripheral neuropathy, vitamin B12 deficiency ABIs in our clinic were 1.03 on the right 1.25 on the left Addendum 03/25/2023. I received the records from Dr. Cheree Ditto at [facility] dermatology in [place]. Looking at her record she felt that the issue was secondary to lymphedema and stasis dermatitis on both legs. I am not sure whether she looked at the areas on her feet which was the real area of concern. She specifically stated that she did not think that this was related to her psoriasis. Compression stockings were suggested. No biopsy was done 9/24; as noted above I did receive records from Dr. Cheree Ditto at [facility] dermatology and [place]. She felt the skin changes on her feet were related to lymphedema and stasis dermatitis. The patient arrived back in clinic the skin on her dorsal feet looks a lot  [name], [name] ([id number]) [id number] Page 1 of 13 Visit Report for 04/13/2023 Debridement Details Patient Name: Date of Service: GA MMA [name] 04/13/2023 8:45 A M Medical Record Number: [id number] Patient Account Number: [id number] Date of Birth/Sex: Treating RN: 25-Mar-1986 (37 y.o. F) Primary Care Provider: Sanda Linger Other Clinician: Referring Provider: Treating Provider/Extender: Madelyn Flavors in Treatment: 3 Debridement Performed for Assessment: Wound #1 Left,Medial Foot Performed By: Physician Maxwell Caul., MD The following information was scribed by: Shawn Stall The information was scribed for: Baltazar Najjar Debridement Type: Debridement Level of Consciousness (Pre-procedure): Awake and Alert Pre-procedure Verification/Time Out Yes - 09:35 Taken: Start Time: 09:36 Pain Control: Lidocaine 4% Topical Solution Percent of Wound Bed Debrided: 30% T Area Debrided (cm): otal 1.53 Tissue and other material debrided: Non-Viable, Eschar, Slough, Slough Level: Non-Viable Tissue Debridement Description: Selective/Open Wound Instrument: Curette Bleeding: Minimum Hemostasis Achieved: Pressure End Time: 09:42 Procedural Pain: 0 Post Procedural Pain: 0 Response to Treatment: Procedure was tolerated well Level of Consciousness (Post- Awake and Alert procedure): Post Debridement Measurements of Total Wound Length: (cm) 6.5 Width: (cm) 1 Depth: (cm) 0.1 Volume: (cm) 0.511 Character of Wound/Ulcer Post Debridement: Requires Further Debridement Post Procedure Diagnosis Same as Pre-procedure Electronic Signature(s) Signed: 04/14/2023 9:33:55 AM By: Baltazar Najjar MD Entered By: Baltazar Najjar on 04/13/2023 07:40:16 -------------------------------------------------------------------------------- Debridement Details Patient Name: Date of Service: [name], [name]. 04/13/2023 8:45 A M Medical Record  Number: [id number] Patient Account Number: [id number] Date of Birth/Sex: Treating RN: 1986-02-21 (37 y.o. F) Primary Care Provider: Sanda Linger Other Clinician: Referring Provider: Treating Provider/Extender: Madelyn Flavors in Treatment: 3 Debridement Performed for Assessment: Wound #3 Left,Proximal,Dorsal Foot Performed By: Physician Maxwell Caul., MD [name] ([id number]) [phone].pdf Page 2 of 13 The following information was scribed by: Shawn Stall The information was scribed for: Baltazar Najjar Debridement Type: Debridement Level of Consciousness (Pre-procedure): Awake and Alert Pre-procedure Verification/Time Out Yes - 09:35 Taken: Start Time: 09:36 Pain Control: Lidocaine 4% Topical Solution Percent of Wound Bed Debrided: 30% T Area Debrided (cm): otal 14.13 Tissue and other material debrided: Non-Viable, Eschar Level: Non-Viable Tissue Debridement Description: Selective/Open Wound Instrument: Curette Bleeding: Minimum Hemostasis Achieved: Pressure End Time: 09:42 Procedural Pain: 0 Post Procedural Pain: 0 Response to Treatment: Procedure was tolerated well Level of Consciousness (Post- Awake and Alert procedure): Post Debridement Measurements of Total Wound Length: (cm) 7.5 Width: (cm) 8 Depth: (cm) 0.2 Volume: (cm) 9.425 Character of Wound/Ulcer Post Debridement: Requires Further Debridement Post Procedure Diagnosis Same as Pre-procedure Electronic Signature(s) Signed: 04/14/2023 9:33:55 AM By: Baltazar Najjar MD Entered By: Baltazar Najjar on 04/13/2023 07:40:23 -------------------------------------------------------------------------------- Debridement Details Patient Name: Date of Service: [name], [name]. 04/13/2023 8:45 A M Medical Record Number: [id number] Patient Account Number: [id number] Date of Birth/Sex: Treating RN: [birth year] (37 y.o. F) Primary Care Provider: Sanda Linger Other  Clinician: Referring Provider: Treating Provider/Extender: Madelyn Flavors in Treatment: 3 Debridement Performed for Assessment: Wound #4 Right,Dorsal Foot Performed By: Physician Maxwell Caul., MD The following information was scribed by: Shawn Stall The information was scribed for: Baltazar Najjar Debridement Type: Debridement Level of Consciousness (Pre-procedure): Awake and Alert Pre-procedure Verification/Time Out Yes - 09:35 Taken: Start Time: 09:36 Pain Control: Lidocaine 4% Topical Solution Percent of Wound Bed Debrided: 50% T Area Debrided (cm): otal 12.56 Tissue and other material debrided: Non-Viable, Eschar, Slough, Slough Level: Non-Viable Tissue Debridement Description: Selective/Open Wound Instrument: Curette Bleeding:  PORTION OF LOWER LEGS AND TO DORSAL FEET TO SECURE WRAPS IN PLACE. Wound #4 - Foot Wound Laterality: Dorsal, Right Cleanser: Soap and Water 1 x Per Week/30 Days Discharge Instructions: May shower and wash wound with dial antibacterial soap and water prior to dressing change. Cleanser: Vashe 5.8 (oz) 1 x Per Week/30 Days Discharge Instructions: Cleanse the wound with Vashe prior to applying a clean dressing using gauze sponges, not tissue or cotton balls. Peri-Wound Care: Triamcinolone 15 (g) 1 x Per Week/30 Days Discharge Instructions: Use triamcinolone ****liberally*** Peri-Wound Care: Sween Lotion (Moisturizing lotion) 1 x Per Week/30 Days Discharge Instructions: Apply moisturizing lotion as directed Topical: Triamcinolone 1 x Per Week/30 Days Discharge Instructions: Apply  Triamcinolone as directed liberallly Prim Dressing: Hydrofera Blue Ready Transfer Foam, 2.5x2.5 (in/in) 1 x Per Week/30 Days ary Discharge Instructions: Apply directly to wound bed as directed Prim Dressing: Santyl Ointment 1 x Per Week/30 Days ary Discharge Instructions: Apply nickel thick amount to wound bed as instructed Secondary Dressing: ABD Pad, 8x10 1 x Per Week/30 Days Discharge Instructions: Apply over primary dressing as directed. Secondary Dressing: Woven Gauze Sponge, Non-Sterile 4x4 in 1 x Per Week/30 Days Discharge Instructions: Apply over primary dressing as directed. Compression Wrap: Kerlix Roll 4.5x3.1 (in/yd) 1 x Per Week/30 Days Discharge Instructions: Apply Kerlix and Coban compression as directed. Compression Wrap: Coban Self-Adherent Wrap 4x5 (in/yd) 1 x Per Week/30 Days Discharge Instructions: Apply over Kerlix as directed. ****UNNA BOOT FIRST LAYER TO UPPER PORTION OF LOWER LEGS AND TO DORSAL FEET TO SECURE WRAPS IN PLACE. Wound #5 - Calcaneus Wound Laterality: Right, Lateral Cleanser: Soap and Water 1 x Per Week/30 Days Discharge Instructions: May shower and wash wound with dial antibacterial soap and water prior to dressing change. Cleanser: Vashe 5.8 (oz) 1 x Per Week/30 Days Discharge Instructions: Cleanse the wound with Vashe prior to applying a clean dressing using gauze sponges, not tissue or cotton balls. Peri-Wound Care: Triamcinolone 15 (g) 1 x Per Week/30 Days Discharge Instructions: Use triamcinolone ****liberally*** Peri-Wound Care: Sween Lotion (Moisturizing lotion) 1 x Per Week/30 Days Discharge Instructions: Apply moisturizing lotion as directed Topical: Triamcinolone 1 x Per Week/30 Days Discharge Instructions: Apply Triamcinolone as directed liberallly Prim Dressing: Hydrofera Blue Ready Transfer Foam, 2.5x2.5 (in/in) 1 x Per Week/30 Days ary Discharge Instructions: Apply directly to wound bed as directed Prim Dressing: Santyl Ointment 1 x Per  Week/30 Days ary Discharge Instructions: Apply nickel thick amount to wound bed as instructed Secondary Dressing: ABD Pad, 8x10 1 x Per Week/30 Days Discharge Instructions: Apply over primary dressing as directed. [name], [name] ([id number]) [id number] Page 8 of 13 Secondary Dressing: Woven Gauze Sponge, Non-Sterile 4x4 in 1 x Per Week/30 Days Discharge Instructions: Apply over primary dressing as directed. Compression Wrap: Kerlix Roll 4.5x3.1 (in/yd) 1 x Per Week/30 Days Discharge Instructions: Apply Kerlix and Coban compression as directed. Compression Wrap: Coban Self-Adherent Wrap 4x5 (in/yd) 1 x Per Week/30 Days Discharge Instructions: Apply over Kerlix as directed. ****UNNA BOOT FIRST LAYER TO UPPER PORTION OF LOWER LEGS AND TO DORSAL FEET TO SECURE WRAPS IN PLACE. Electronic Signature(s) Signed: 04/13/2023 5:32:42 PM By: Shawn Stall RN, BSN Signed: 04/14/2023 9:33:55 AM By: Baltazar Najjar MD Entered By: Shawn Stall on 04/13/2023 06:50:10 -------------------------------------------------------------------------------- Problem List Details Patient Name: Date of Service: [name], [name]. 04/13/2023 8:45 A M Medical Record Number: [id number] Patient Account Number: [id number] Date of Birth/Sex: Treating RN: 1986-02-27 (37 y.o. [name] Primary Care Provider: Sanda Linger Other  Minimum Hemostasis Achieved: Pressure End Time: 09:42 Procedural Pain: 0 Post Procedural Pain: 0 Response to Treatment: Procedure was tolerated well Level of Consciousness [name], [name] ([id number]) [id number] Page 3 of 13 Level of Consciousness (Post- Awake and Alert procedure): Post Debridement Measurements of Total Wound Length: (cm) 4 Width: (cm) 8 Depth: (cm) 0.2 Volume: (cm) 5.027 Character of Wound/Ulcer Post Debridement: Requires Further Debridement Post Procedure Diagnosis Same as Pre-procedure Electronic Signature(s) Signed: 04/14/2023 9:33:55 AM By: Baltazar Najjar MD Entered By: Baltazar Najjar on 04/13/2023 07:40:31 -------------------------------------------------------------------------------- Debridement Details Patient Name: Date of Service: [name], [name]. 04/13/2023 8:45 A M Medical Record Number: [id number] Patient Account Number: [id number] Date of Birth/Sex: Treating RN: Jun 03, 1986 (37 y.o. F) Primary Care Provider: Sanda Linger Other Clinician: Referring Provider: Treating Provider/Extender: Madelyn Flavors in Treatment:  3 Debridement Performed for Assessment: Wound #5 Right,Lateral Calcaneus Performed By: Physician Maxwell Caul., MD The following information was scribed by: Shawn Stall The information was scribed for: Baltazar Najjar Debridement Type: Debridement Level of Consciousness (Pre-procedure): Awake and Alert Pre-procedure Verification/Time Out Yes - 09:35 Taken: Start Time: 09:36 Pain Control: Lidocaine 4% T opical Solution Percent of Wound Bed Debrided: 100% T Area Debrided (cm): otal 1.02 Tissue and other material debrided: Non-Viable, Slough, Slough Level: Non-Viable Tissue Debridement Description: Selective/Open Wound Instrument: Curette Bleeding: Minimum Hemostasis Achieved: Pressure End Time: 09:42 Procedural Pain: 0 Post Procedural Pain: 0 Response to Treatment: Procedure was tolerated well Level of Consciousness (Post- Awake and Alert procedure): Post Debridement Measurements of Total Wound Length: (cm) 1 Width: (cm) 1.3 Depth: (cm) 0.1 Volume: (cm) 0.102 Character of Wound/Ulcer Post Debridement: Requires Further Debridement Post Procedure Diagnosis Same as Pre-procedure Electronic Signature(s) Signed: 04/14/2023 9:33:55 AM By: Baltazar Najjar MD Entered By: Baltazar Najjar on 04/13/2023 07:40:38 Hillis Range, [name] ([id number]) [id number] Page 4 of 13 -------------------------------------------------------------------------------- HPI Details Patient Name: Date of Service: GA MMA [name] 04/13/2023 8:45 A M Medical Record Number: [id number] Patient Account Number: [id number] Date of Birth/Sex: Treating RN: 02-27-1986 (37 y.o. F) Primary Care Provider: Sanda Linger Other Clinician: Referring Provider: Treating Provider/Extender: Madelyn Flavors in Treatment: 3 History of Present Illness HPI Description: ADMISSION 03/23/2023 This is a 37 year old woman who is Story is somewhat difficult to follow however  she apparently has developed bilateral lower extremity swelling involving her feet and ankles roughly 3 weeks ago. This but this became associated with small painful areas on predominantly her left greater than right foot with exfoliation of the skin. She was admitted to hospital from 03/16/2023 through 03/19/2023. X-ray of the feet was negative however she was noted to have a sedimentation rate greater than 90. Although she was empirically given antibiotics at the start of the admission. She was seen by infectious disease who did not feel this was infectious. It was felt that she required a skin biopsy for possible vasculitis. Dermatology and rheumatology consults were suggested. She was discharged from the hospital on a gradually tapering dose of prednisone. She had cryoglobulins, complement levels measured. She did not have an active urinalysis. She has been soaking her feet in ice water and cold towels to relieve the pain. She saw her dermatologist who is in [place] yesterday for consideration of a skin biopsy. Per the patient's description the dermatologist did not wish to do a skin biopsy stating that he did not want to create another wound area although I do not have his or her notes at this point

## 2023-04-14 NOTE — Progress Notes (Signed)
04/14/2023 Name: [name] MRN: [id number] DOB: [birth year]  No chief complaint on file.   [name] is a 37 y.o. year old female who presented for a follow up telephone visit.   They were referred to the pharmacist by their PCP for assistance in managing hypertension and hypokalemia .     Subjective:  Care Team: Primary Care Provider: Etta Grandchild, MD ; Next Scheduled Visit: 07/05/2023 Wound clinic, next visit: 04/13/2023  Medication Access/Adherence  Current Pharmacy:  [facility] [address]  - [address] [place] [address] Phone: [phone] Fax: [phone]    Patient reports affordability concerns with their medications: No  Patient reports access/transportation concerns to their pharmacy: No  Patient reports adherence concerns with their medications:  No       Hypertension/hypokalemia:  Current medications: spironolactone 25 mg daily, nebivolol 10 mg daily, prazosin 1 mg daily Patient reports she has not been taking potassium chloride    Patient does not have a validated, automated, upper arm home BP cuff 10/8 BP at Wound Clinic was around 137/87 however patient is unable to remember  She was unable to come by for labs yesterday. She will plan to come next Tuesday for lab work  Objective:  Lab Results  Component Value Date   HGBA1C 4.5 (L) 02/02/2023    Lab Results  Component Value Date   CREATININE 0.63 03/30/2023   BUN 11 03/30/2023   NA 140 03/30/2023   K 2.6 (LL) 03/30/2023   CL 96 03/30/2023   CO2 33 (H) 03/30/2023    Lab Results  Component Value Date   CHOL 193 02/11/2022   HDL 82.60 02/11/2022   LDLCALC 84 02/11/2022   LDLDIRECT 87.0 10/17/2020   TRIG 134.0 02/11/2022   CHOLHDL 2 02/11/2022    Medications Reviewed Today   Medications were not reviewed in this encounter       Assessment/Plan:   Hypertension: - Currently controlled - Reviewed BP goal  <130/80 - Recommend to ask wound clinic what BP is when she goes  - She will come in 10/15 for walk-in BMP to check potassium s/p med changes. Due to transportation reliance on her mother and distance, this is the earliest she can come. Likely will need to add back potassium chloride if still low.      Follow Up Plan: 10/16 at 3 PM telephone follow up  Arbutus Leas, PharmD, BCPS [facility] [phone]

## 2023-04-14 NOTE — Progress Notes (Signed)
Discharge Instruction: Apply nickel thick amount to wound bed as instructed Secondary Dressing ABD Pad, 8x10 Discharge Instruction: Apply over primary dressing as directed. Woven Gauze Sponge, Non-Sterile 4x4 in Discharge Instruction: Apply over primary dressing as directed. Secured With Compression Wrap Kerlix Roll 4.5x3.1 (in/yd) Discharge Instruction: Apply Kerlix and Coban compression as directed. Coban Self-Adherent Wrap 4x5 (in/yd) Discharge Instruction: Apply over Kerlix as directed. ****UNNA BOOT FIRST LAYER TO UPPER PORTION OF LOWER LEGS AND TO DORSAL FEET TO SECURE WRAPS IN PLACE. Compression Stockings Add-Ons Electronic Signature(s) Signed: 04/14/2023 9:33:55 AM By: Baltazar Najjar MD Entered By: Baltazar Najjar on 04/13/2023 07:40:06 [name], [name] ([id number]) [id number] Page 8 of 19 -------------------------------------------------------------------------------- Multi-Disciplinary Care Plan Details Patient Name: Date of Service: [place] MMA [name] 04/13/2023 8:45 A M Medical Record Number: [id number] Patient Account Number: [id number] Date of Birth/Sex: Treating RN: [name] (37 y.o. [name], [name] Primary Care Jeremaine Maraj: Sanda Linger Other Clinician: Referring Laural Eiland: Treating Masin Shatto/Extender: Madelyn [name] in Treatment: 3 Active Inactive Pain, Acute or Chronic Nursing Diagnoses: Pain, acute or chronic: actual or potential Potential alteration in comfort,  pain Goals: Patient will verbalize adequate pain control and receive pain control interventions during procedures as needed Date Initiated: 03/23/2023 Target Resolution Date: 05/07/2023 Goal Status: Active Patient/caregiver will verbalize comfort level met Date Initiated: 03/23/2023 Date Inactivated: 04/13/2023 Target Resolution Date: 04/13/2023 Goal Status: Met Interventions: Encourage patient to take pain medications as prescribed Provide education on pain management Provision of support: recognize patient pain, provide comfort and support as needed Treatment Activities: Administer pain control measures as ordered : 03/23/2023 Notes: Wound/Skin Impairment Nursing Diagnoses: Knowledge deficit related to ulceration/compromised skin integrity Goals: Patient/caregiver will verbalize understanding of skin care regimen Date Initiated: 03/23/2023 Target Resolution Date: 05/07/2023 Goal Status: Active Interventions: Assess patient/caregiver ability to obtain necessary supplies Assess patient/caregiver ability to perform ulcer/skin care regimen upon admission and as needed Provide education on ulcer and skin care Treatment Activities: Skin care regimen initiated : 03/23/2023 Topical wound management initiated : 03/23/2023 Notes: Electronic Signature(s) Signed: 04/13/2023 5:32:42 PM By: Shawn Stall RN, BSN Entered By: Shawn Stall on 04/13/2023 06:24:45 [name] ([id number]) [id number] Page 9 of 19 -------------------------------------------------------------------------------- Pain Assessment Details Patient Name: Date of Service: GA MMA [name] 04/13/2023 8:45 A M Medical Record Number: [id number] Patient Account Number: [id number] Date of Birth/Sex: Treating RN: [birth year] (37 y.o. [name] Primary Care Sarahi Borland: Sanda Linger Other Clinician: Referring Markeith Jue: Treating Lien Lyman/Extender: Madelyn [name] in  Treatment: 3 Active Problems Location of Pain Severity and Description of Pain Patient Has Paino No Site Locations Pain Management and Medication Current Pain Management: Electronic Signature(s) Signed: 04/13/2023 5:32:42 PM By: Shawn Stall RN, BSN Entered By: Shawn Stall on 04/13/2023 06:08:35 -------------------------------------------------------------------------------- Patient/Caregiver Education Details Patient Name: Date of Service: [name], [name]. 10/8/2024andnbsp8:45 A M Medical Record Number: [id number] Patient Account Number: [id number] Date of Birth/Gender: Treating RN: [name][birth year] (37 y.o. [name] Primary Care Physician: Sanda Linger Other Clinician: Referring Physician: Treating Physician/Extender: Madelyn [name] in Treatment: 3 Education Assessment Education Provided To: Patient Education Topics Provided Wound/Skin Impairment: Handouts: Caring for Your Ulcer Methods: Explain/Verbal Responses: Reinforcements needed BRECKLYN, GALVIS ([id number]) [phone].pdf Page 10 of 19 Electronic Signature(s) Signed: 04/13/2023 5:32:42 PM By: Shawn Stall RN, BSN Entered By: Shawn Stall on 04/13/2023 06:26:[name] -------------------------------------------------------------------------------- Wound Assessment Details Patient Name: Date of Service: [name], [name]. 04/13/2023 8:45 A M Medical Record  1 Tunneling: No 7.697 Undermining: No 0.77 Wound Description Classification: Full Thickness Without Exposed Sup Wound Margin: Distinct, outline attached Exudate Amount: Medium Exudate Type: Serosanguineous Exudate Color: red, brown port Structures Foul Odor After Cleansing: No Slough/Fibrino Yes Wound Bed Granulation Amount: Medium (34-66%) Exposed Structure Necrotic Amount: Medium (34-66%) Fascia Exposed: No Necrotic Quality: Adherent Slough Fat Layer (Subcutaneous Tissue) Exposed: Yes Tendon Exposed: No Muscle Exposed: No Joint Exposed: No Bone Exposed: No Periwound Skin Texture Texture Color No Abnormalities Noted: No No Abnormalities Noted: No Callus: No Atrophie Blanche: No Crepitus: No Cyanosis: No Excoriation: No Ecchymosis: No Induration: No Erythema: No Rash: No Hemosiderin Staining: No Scarring: No Mottled: No Pallor: No Moisture Rubor: No No Abnormalities Noted: No Dry / Scaly: Yes Maceration: No Treatment Notes Wound #2 (Foot) Wound Laterality: Left, Distal Cleanser Soap and Water Discharge Instruction: May shower and wash wound with dial antibacterial soap and water prior to dressing change. Vashe 5.8 (oz) Discharge Instruction: Cleanse the wound with Vashe prior to applying a clean dressing using gauze sponges, not tissue or cotton balls. [name], [name] ([id number]) [id number] Page 13 of 19 Peri-Wound Care Triamcinolone 15 (g) Discharge Instruction: Use triamcinolone ****liberally*** Sween Lotion (Moisturizing lotion) Discharge Instruction: Apply moisturizing lotion as directed Topical Triamcinolone Discharge Instruction: Apply Triamcinolone as directed liberallly Primary Dressing Hydrofera Blue Ready Transfer Foam, 2.5x2.5 (in/in) Discharge Instruction: Apply directly to  wound bed as directed Santyl Ointment Discharge Instruction: Apply nickel thick amount to wound bed as instructed Secondary Dressing ABD Pad, 8x10 Discharge Instruction: Apply over primary dressing as directed. Woven Gauze Sponge, Non-Sterile 4x4 in Discharge Instruction: Apply over primary dressing as directed. Secured With Compression Wrap Kerlix Roll 4.5x3.1 (in/yd) Discharge Instruction: Apply Kerlix and Coban compression as directed. Coban Self-Adherent Wrap 4x5 (in/yd) Discharge Instruction: Apply over Kerlix as directed. ****UNNA BOOT FIRST LAYER TO UPPER PORTION OF LOWER LEGS AND TO DORSAL FEET TO SECURE WRAPS IN PLACE. Compression Stockings Add-Ons Electronic Signature(s) Signed: 04/13/2023 2:48:47 PM By: Tommie Ard RN Signed: 04/13/2023 5:32:42 PM By: Shawn Stall RN, BSN Entered By: Tommie Ard on 04/13/2023 06:21:29 -------------------------------------------------------------------------------- Wound Assessment Details Patient Name: Date of Service: [name], [name]. 04/13/2023 8:45 A M Medical Record Number: [id number] Patient Account Number: [id number] Date of Birth/Sex: Treating RN: [name], [name] (37 y.o. [name] Primary Care Aariel Ems: Sanda Linger Other Clinician: Referring Juwaun Inskeep: Treating Laurynn Mccorvey/Extender: Madelyn [name] in Treatment: 3 Wound Status Wound Number: 3 Primary Etiology: Lymphedema Wound Location: Left, Proximal, Dorsal Foot Wound Status: Open Wounding Event: Gradually Appeared Comorbid History: Anemia, Sleep Apnea, Vasculitis, Neuropathy Date Acquired: 03/09/2023 Weeks Of Treatment: 3 Clustered Wound: Yes Photos [name], [name] ([id number]) [id number] Page 14 of 19 Wound Measurements Length: (cm) Width: (cm) Depth: (cm) Clustered Quantity: Area: (cm) Volume: (cm) 7.5 % Reduction in Area: -188.5% 8 % Reduction in Volume: -188.5% 0.2 Epithelialization: Small (1-33%) 7  Tunneling: No 47.124 Undermining: No 9.425 Wound Description Classification: Unclassifiable Wound Margin: Distinct, outline attached Exudate Amount: Medium Exudate Type: Serosanguineous Exudate Color: red, brown Foul Odor After Cleansing: No Slough/Fibrino Yes Wound Bed Granulation Amount: None Present (0%) Exposed Structure Necrotic Amount: Large (67-100%) Fascia Exposed: No Necrotic Quality: Eschar, Adherent Slough Fat Layer (Subcutaneous Tissue) Exposed: No Tendon Exposed: No Muscle Exposed: No Joint Exposed: No Bone Exposed: No Periwound Skin Texture Texture Color No Abnormalities Noted: No No Abnormalities Noted: No Callus: No Atrophie Blanche: No Crepitus: No Cyanosis: No Excoriation: No Ecchymosis: No Induration: No Erythema: No Rash: No Hemosiderin  1 Tunneling: No 7.697 Undermining: No 0.77 Wound Description Classification: Full Thickness Without Exposed Sup Wound Margin: Distinct, outline attached Exudate Amount: Medium Exudate Type: Serosanguineous Exudate Color: red, brown port Structures Foul Odor After Cleansing: No Slough/Fibrino Yes Wound Bed Granulation Amount: Medium (34-66%) Exposed Structure Necrotic Amount: Medium (34-66%) Fascia Exposed: No Necrotic Quality: Adherent Slough Fat Layer (Subcutaneous Tissue) Exposed: Yes Tendon Exposed: No Muscle Exposed: No Joint Exposed: No Bone Exposed: No Periwound Skin Texture Texture Color No Abnormalities Noted: No No Abnormalities Noted: No Callus: No Atrophie Blanche: No Crepitus: No Cyanosis: No Excoriation: No Ecchymosis: No Induration: No Erythema: No Rash: No Hemosiderin Staining: No Scarring: No Mottled: No Pallor: No Moisture Rubor: No No Abnormalities Noted: No Dry / Scaly: Yes Maceration: No Treatment Notes Wound #2 (Foot) Wound Laterality: Left, Distal Cleanser Soap and Water Discharge Instruction: May shower and wash wound with dial antibacterial soap and water prior to dressing change. Vashe 5.8 (oz) Discharge Instruction: Cleanse the wound with Vashe prior to applying a clean dressing using gauze sponges, not tissue or cotton balls. [name], [name] ([id number]) [id number] Page 13 of 19 Peri-Wound Care Triamcinolone 15 (g) Discharge Instruction: Use triamcinolone ****liberally*** Sween Lotion (Moisturizing lotion) Discharge Instruction: Apply moisturizing lotion as directed Topical Triamcinolone Discharge Instruction: Apply Triamcinolone as directed liberallly Primary Dressing Hydrofera Blue Ready Transfer Foam, 2.5x2.5 (in/in) Discharge Instruction: Apply directly to  wound bed as directed Santyl Ointment Discharge Instruction: Apply nickel thick amount to wound bed as instructed Secondary Dressing ABD Pad, 8x10 Discharge Instruction: Apply over primary dressing as directed. Woven Gauze Sponge, Non-Sterile 4x4 in Discharge Instruction: Apply over primary dressing as directed. Secured With Compression Wrap Kerlix Roll 4.5x3.1 (in/yd) Discharge Instruction: Apply Kerlix and Coban compression as directed. Coban Self-Adherent Wrap 4x5 (in/yd) Discharge Instruction: Apply over Kerlix as directed. ****UNNA BOOT FIRST LAYER TO UPPER PORTION OF LOWER LEGS AND TO DORSAL FEET TO SECURE WRAPS IN PLACE. Compression Stockings Add-Ons Electronic Signature(s) Signed: 04/13/2023 2:48:47 PM By: Tommie Ard RN Signed: 04/13/2023 5:32:42 PM By: Shawn Stall RN, BSN Entered By: Tommie Ard on 04/13/2023 06:21:29 -------------------------------------------------------------------------------- Wound Assessment Details Patient Name: Date of Service: [name], [name]. 04/13/2023 8:45 A M Medical Record Number: [id number] Patient Account Number: [id number] Date of Birth/Sex: Treating RN: [name], [name] (37 y.o. [name] Primary Care Aariel Ems: Sanda Linger Other Clinician: Referring Juwaun Inskeep: Treating Laurynn Mccorvey/Extender: Madelyn [name] in Treatment: 3 Wound Status Wound Number: 3 Primary Etiology: Lymphedema Wound Location: Left, Proximal, Dorsal Foot Wound Status: Open Wounding Event: Gradually Appeared Comorbid History: Anemia, Sleep Apnea, Vasculitis, Neuropathy Date Acquired: 03/09/2023 Weeks Of Treatment: 3 Clustered Wound: Yes Photos [name], [name] ([id number]) [id number] Page 14 of 19 Wound Measurements Length: (cm) Width: (cm) Depth: (cm) Clustered Quantity: Area: (cm) Volume: (cm) 7.5 % Reduction in Area: -188.5% 8 % Reduction in Volume: -188.5% 0.2 Epithelialization: Small (1-33%) 7  Tunneling: No 47.124 Undermining: No 9.425 Wound Description Classification: Unclassifiable Wound Margin: Distinct, outline attached Exudate Amount: Medium Exudate Type: Serosanguineous Exudate Color: red, brown Foul Odor After Cleansing: No Slough/Fibrino Yes Wound Bed Granulation Amount: None Present (0%) Exposed Structure Necrotic Amount: Large (67-100%) Fascia Exposed: No Necrotic Quality: Eschar, Adherent Slough Fat Layer (Subcutaneous Tissue) Exposed: No Tendon Exposed: No Muscle Exposed: No Joint Exposed: No Bone Exposed: No Periwound Skin Texture Texture Color No Abnormalities Noted: No No Abnormalities Noted: No Callus: No Atrophie Blanche: No Crepitus: No Cyanosis: No Excoriation: No Ecchymosis: No Induration: No Erythema: No Rash: No Hemosiderin  as directed liberallly Primary Dressing Hydrofera Blue Ready Transfer Foam, 2.5x2.5 (in/in) Discharge Instruction: Apply directly to wound bed as directed Santyl Ointment Discharge Instruction: Apply nickel thick amount to wound bed as instructed Secondary Dressing ABD Pad, 8x10 Discharge Instruction: Apply over primary dressing as directed. Woven Gauze Sponge, Non-Sterile 4x4 in Discharge Instruction: Apply over primary dressing as directed. Secured With Compression  Wrap Kerlix Roll 4.5x3.1 (in/yd) Discharge Instruction: Apply Kerlix and Coban compression as directed. Coban Self-Adherent Wrap 4x5 (in/yd) Discharge Instruction: Apply over Kerlix as directed. ****UNNA BOOT FIRST LAYER TO UPPER PORTION OF LOWER LEGS AND TO DORSAL FEET TO SECURE WRAPS IN PLACE. [name], [name] ([id number]) [id number] Page 17 of 19 Compression Stockings Add-Ons Electronic Signature(s) Signed: 04/13/2023 2:48:47 PM By: Tommie Ard RN Signed: 04/13/2023 5:32:42 PM By: Shawn Stall RN, BSN Entered By: Tommie Ard on 04/13/2023 06:20:04 -------------------------------------------------------------------------------- Wound Assessment Details Patient Name: Date of Service: [name], [name]. 04/13/2023 8:45 A M Medical Record Number: [id number] Patient Account Number: [id number] Date of Birth/Sex: Treating RN: [name] (37 y.o. [name], [name] Primary Care Panzy Bubeck: Sanda Linger Other Clinician: Referring Isaack Preble: Treating Tomeca Helm/Extender: Madelyn [name] in Treatment: 3 Wound Status Wound Number: 5 Primary Etiology: Lymphedema Wound Location: Right, Lateral Calcaneus Wound Status: Open Wounding Event: Gradually Appeared Comorbid History: Anemia, Sleep Apnea, Vasculitis, Neuropathy Date Acquired: 03/09/2023 Weeks Of Treatment: 3 Clustered Wound: No Photos Wound Measurements Length: (cm) 1 Width: (cm) 1.3 Depth: (cm) 0.1 Area: (cm) 1.021 Volume: (cm) 0.102 % Reduction in Area: 82.7% % Reduction in Volume: 82.7% Epithelialization: Small (1-33%) Tunneling: No Undermining: No Wound Description Classification: Full Thickness Without Exposed Support Structures Wound Margin: Distinct, outline attached Exudate Amount: Medium Exudate Type: Serosanguineous Exudate Color: red, brown Foul Odor After Cleansing: No Slough/Fibrino No Wound Bed Granulation Amount: Small (1-33%) Exposed Structure Granulation  Quality: Red, Pink Fascia Exposed: No Necrotic Amount: Large (67-100%) Fat Layer (Subcutaneous Tissue) Exposed: Yes Necrotic Quality: Adherent Slough Tendon Exposed: No Muscle Exposed: No Joint Exposed: No Bone Exposed: No Periwound Skin Texture Texture Color No Abnormalities Noted: No No Abnormalities Noted: No Canizalez, Faatimah [name] ([id number]) [id number] Page [name] of 19 Callus: No Atrophie Blanche: No Crepitus: No Cyanosis: No Excoriation: No Ecchymosis: No Induration: No Erythema: No Rash: No Hemosiderin Staining: No Scarring: No Mottled: No Pallor: No Moisture Rubor: No No Abnormalities Noted: No Dry / Scaly: Yes Maceration: No Treatment Notes Wound #5 (Calcaneus) Wound Laterality: Right, Lateral Cleanser Soap and Water Discharge Instruction: May shower and wash wound with dial antibacterial soap and water prior to dressing change. Vashe 5.8 (oz) Discharge Instruction: Cleanse the wound with Vashe prior to applying a clean dressing using gauze sponges, not tissue or cotton balls. Peri-Wound Care Triamcinolone 15 (g) Discharge Instruction: Use triamcinolone ****liberally*** Sween Lotion (Moisturizing lotion) Discharge Instruction: Apply moisturizing lotion as directed Topical Triamcinolone Discharge Instruction: Apply Triamcinolone as directed liberallly Primary Dressing Hydrofera Blue Ready Transfer Foam, 2.5x2.5 (in/in) Discharge Instruction: Apply directly to wound bed as directed Santyl Ointment Discharge Instruction: Apply nickel thick amount to wound bed as instructed Secondary Dressing ABD Pad, 8x10 Discharge Instruction: Apply over primary dressing as directed. Woven Gauze Sponge, Non-Sterile 4x4 in Discharge Instruction: Apply over primary dressing as directed. Secured With Compression Wrap Kerlix Roll 4.5x3.1 (in/yd) Discharge Instruction: Apply Kerlix and Coban compression as directed. Coban Self-Adherent Wrap 4x5  (in/yd) Discharge Instruction: Apply over Kerlix as directed. ****UNNA BOOT FIRST LAYER TO UPPER PORTION OF LOWER LEGS AND TO DORSAL FEET TO  SECURE WRAPS IN PLACE. Compression Stockings Add-Ons Electronic Signature(s) Signed: 04/13/2023 2:48:47 PM By: Tommie Ard RN Signed: 04/13/2023 5:32:42 PM By: Shawn Stall RN, BSN Entered By: Tommie Ard on 04/13/2023 06:19:21 -------------------------------------------------------------------------------- Vitals Details Patient Name: Date of Service: [name], [name]. 04/13/2023 8:45 A M Medical Record Number: [id number] Patient Account Number: [id number] [name], [name] ([id number]) [id number] Page 19 of 19 Date of Birth/Sex: Treating RN: [birth year] [name] (37 y.o. [name], [name] Primary Care Breylon Sherrow: Sanda Linger Other Clinician: Referring Zakar Brosch: Treating [name] in Treatment: 3 Vital Signs Time Taken: 09:00 Temperature (F): 98.3 Height (in): 69 Pulse (bpm): 114 Weight (lbs): 272 Respiratory Rate (breaths/min): 20 Body Mass Index (BMI): 40.2 Blood Pressure (mmHg): 136/87 Reference [name]: 80 - 120 mg / dl Electronic Signature(s) Signed: 04/13/2023 5:32:42 PM By: Shawn Stall RN, BSN Entered By: Shawn Stall on 04/13/2023 06:08:31  Staining: No Scarring: No Mottled: No Pallor: No Moisture Rubor: No No Abnormalities Noted: No Dry / Scaly: Yes Maceration: No Treatment Notes Wound #3 (Foot) Wound Laterality: Dorsal, Left, Proximal Cleanser Soap and Water Discharge Instruction: May shower and wash wound with dial antibacterial soap and water prior to dressing change. Vashe 5.8 (oz) Discharge Instruction: Cleanse the wound with Vashe prior to applying a clean dressing using gauze sponges, not tissue or cotton balls. Peri-Wound Care Triamcinolone 15 (g) Discharge Instruction: Use triamcinolone ****liberally*** Sween Lotion (Moisturizing lotion) Discharge Instruction: Apply moisturizing lotion as directed Topical Triamcinolone Discharge Instruction: Apply Triamcinolone as directed liberallly Primary Dressing [facility] Blue Ready Transfer Foam, 2.5x2.5 (in/in) Burel, Micky [name] ([id number]) [phone].pdf Page 15 of 19 Discharge Instruction: Apply directly to wound bed as directed Santyl Ointment Discharge Instruction: Apply nickel thick amount to wound bed as instructed Secondary Dressing ABD Pad,  8x10 Discharge Instruction: Apply over primary dressing as directed. Woven Gauze Sponge, Non-Sterile 4x4 in Discharge Instruction: Apply over primary dressing as directed. Secured With Compression Wrap Kerlix Roll 4.5x3.1 (in/yd) Discharge Instruction: Apply Kerlix and Coban compression as directed. Coban Self-Adherent Wrap 4x5 (in/yd) Discharge Instruction: Apply over Kerlix as directed. ****UNNA BOOT FIRST LAYER TO UPPER PORTION OF LOWER LEGS AND TO DORSAL FEET TO SECURE WRAPS IN PLACE. Compression Stockings Add-Ons Electronic Signature(s) Signed: 04/13/2023 2:48:47 PM By: Tommie Ard RN Signed: 04/13/2023 5:32:42 PM By: Shawn Stall RN, BSN Entered By: Tommie Ard on 04/13/2023 06:20:53 -------------------------------------------------------------------------------- Wound Assessment Details Patient Name: Date of Service: [name], [name]. 04/13/2023 8:45 A M Medical Record Number: [id number] Patient Account Number: [id number] Date of Birth/Sex: Treating RN: [birth year] (37 y.o. [name], [name] Primary Care Ritik Stavola: Sanda Linger Other Clinician: Referring Case Vassell: Treating Karishma Unrein/Extender: Madelyn [name] in Treatment: 3 Wound Status Wound Number: 4 Primary Etiology: Lymphedema Wound Location: Right, Dorsal Foot Wound Status: Open Wounding Event: Gradually Appeared Comorbid History: Anemia, Sleep Apnea, Vasculitis, Neuropathy Date Acquired: 03/09/2023 Weeks Of Treatment: 3 Clustered Wound: Yes Photos Wound Measurements Length: (cm) 4 Width: (cm) 8 Depth: (cm) 0.2 Clustered Quantity: 5 Area: (cm) 25.133 Volume: (cm) 5.027 Barrientez, Thamar [name] ([id number]) % Reduction in Area: 33.3% % Reduction in Volume: -33.3% Epithelialization: None Tunneling: No Undermining: No [phone].pdf Page 16 of 19 Wound Description Classification: Full Thickness Without Exposed Support Structures Wound Margin: Distinct, outline  attached Exudate Amount: Medium Exudate Type: Serosanguineous Exudate Color: red, brown Foul Odor After Cleansing: No Slough/Fibrino Yes Wound Bed Granulation Amount: Small (1-33%) Exposed Structure Granulation Quality: Red, Pink Fascia Exposed: No Necrotic Amount: Large (67-100%) Fat Layer (Subcutaneous Tissue) Exposed: Yes Necrotic Quality: Eschar, Adherent Slough Tendon Exposed: No Muscle Exposed: No Joint Exposed: No Bone Exposed: No Periwound Skin Texture Texture Color No Abnormalities Noted: No No Abnormalities Noted: No Callus: No Atrophie Blanche: No Crepitus: No Cyanosis: No Excoriation: No Ecchymosis: No Induration: No Erythema: No Rash: No Hemosiderin Staining: No Scarring: No Mottled: No Pallor: No Moisture Rubor: No No Abnormalities Noted: No Dry / Scaly: Yes Maceration: No Treatment Notes Wound #4 (Foot) Wound Laterality: Dorsal, Right Cleanser Soap and Water Discharge Instruction: May shower and wash wound with dial antibacterial soap and water prior to dressing change. Vashe 5.8 (oz) Discharge Instruction: Cleanse the wound with Vashe prior to applying a clean dressing using gauze sponges, not tissue or cotton balls. Peri-Wound Care Triamcinolone 15 (g) Discharge Instruction: Use triamcinolone ****liberally*** Sween Lotion (Moisturizing lotion) Discharge Instruction: Apply moisturizing lotion as directed Topical Triamcinolone Discharge Instruction: Apply Triamcinolone  1 Tunneling: No 7.697 Undermining: No 0.77 Wound Description Classification: Full Thickness Without Exposed Sup Wound Margin: Distinct, outline attached Exudate Amount: Medium Exudate Type: Serosanguineous Exudate Color: red, brown port Structures Foul Odor After Cleansing: No Slough/Fibrino Yes Wound Bed Granulation Amount: Medium (34-66%) Exposed Structure Necrotic Amount: Medium (34-66%) Fascia Exposed: No Necrotic Quality: Adherent Slough Fat Layer (Subcutaneous Tissue) Exposed: Yes Tendon Exposed: No Muscle Exposed: No Joint Exposed: No Bone Exposed: No Periwound Skin Texture Texture Color No Abnormalities Noted: No No Abnormalities Noted: No Callus: No Atrophie Blanche: No Crepitus: No Cyanosis: No Excoriation: No Ecchymosis: No Induration: No Erythema: No Rash: No Hemosiderin Staining: No Scarring: No Mottled: No Pallor: No Moisture Rubor: No No Abnormalities Noted: No Dry / Scaly: Yes Maceration: No Treatment Notes Wound #2 (Foot) Wound Laterality: Left, Distal Cleanser Soap and Water Discharge Instruction: May shower and wash wound with dial antibacterial soap and water prior to dressing change. Vashe 5.8 (oz) Discharge Instruction: Cleanse the wound with Vashe prior to applying a clean dressing using gauze sponges, not tissue or cotton balls. [name], [name] ([id number]) [id number] Page 13 of 19 Peri-Wound Care Triamcinolone 15 (g) Discharge Instruction: Use triamcinolone ****liberally*** Sween Lotion (Moisturizing lotion) Discharge Instruction: Apply moisturizing lotion as directed Topical Triamcinolone Discharge Instruction: Apply Triamcinolone as directed liberallly Primary Dressing Hydrofera Blue Ready Transfer Foam, 2.5x2.5 (in/in) Discharge Instruction: Apply directly to  wound bed as directed Santyl Ointment Discharge Instruction: Apply nickel thick amount to wound bed as instructed Secondary Dressing ABD Pad, 8x10 Discharge Instruction: Apply over primary dressing as directed. Woven Gauze Sponge, Non-Sterile 4x4 in Discharge Instruction: Apply over primary dressing as directed. Secured With Compression Wrap Kerlix Roll 4.5x3.1 (in/yd) Discharge Instruction: Apply Kerlix and Coban compression as directed. Coban Self-Adherent Wrap 4x5 (in/yd) Discharge Instruction: Apply over Kerlix as directed. ****UNNA BOOT FIRST LAYER TO UPPER PORTION OF LOWER LEGS AND TO DORSAL FEET TO SECURE WRAPS IN PLACE. Compression Stockings Add-Ons Electronic Signature(s) Signed: 04/13/2023 2:48:47 PM By: Tommie Ard RN Signed: 04/13/2023 5:32:42 PM By: Shawn Stall RN, BSN Entered By: Tommie Ard on 04/13/2023 06:21:29 -------------------------------------------------------------------------------- Wound Assessment Details Patient Name: Date of Service: [name], [name]. 04/13/2023 8:45 A M Medical Record Number: [id number] Patient Account Number: [id number] Date of Birth/Sex: Treating RN: [name], [name] (37 y.o. [name] Primary Care Aariel Ems: Sanda Linger Other Clinician: Referring Juwaun Inskeep: Treating Laurynn Mccorvey/Extender: Madelyn [name] in Treatment: 3 Wound Status Wound Number: 3 Primary Etiology: Lymphedema Wound Location: Left, Proximal, Dorsal Foot Wound Status: Open Wounding Event: Gradually Appeared Comorbid History: Anemia, Sleep Apnea, Vasculitis, Neuropathy Date Acquired: 03/09/2023 Weeks Of Treatment: 3 Clustered Wound: Yes Photos [name], [name] ([id number]) [id number] Page 14 of 19 Wound Measurements Length: (cm) Width: (cm) Depth: (cm) Clustered Quantity: Area: (cm) Volume: (cm) 7.5 % Reduction in Area: -188.5% 8 % Reduction in Volume: -188.5% 0.2 Epithelialization: Small (1-33%) 7  Tunneling: No 47.124 Undermining: No 9.425 Wound Description Classification: Unclassifiable Wound Margin: Distinct, outline attached Exudate Amount: Medium Exudate Type: Serosanguineous Exudate Color: red, brown Foul Odor After Cleansing: No Slough/Fibrino Yes Wound Bed Granulation Amount: None Present (0%) Exposed Structure Necrotic Amount: Large (67-100%) Fascia Exposed: No Necrotic Quality: Eschar, Adherent Slough Fat Layer (Subcutaneous Tissue) Exposed: No Tendon Exposed: No Muscle Exposed: No Joint Exposed: No Bone Exposed: No Periwound Skin Texture Texture Color No Abnormalities Noted: No No Abnormalities Noted: No Callus: No Atrophie Blanche: No Crepitus: No Cyanosis: No Excoriation: No Ecchymosis: No Induration: No Erythema: No Rash: No Hemosiderin  Staining: No Scarring: No Mottled: No Pallor: No Moisture Rubor: No No Abnormalities Noted: No Dry / Scaly: Yes Maceration: No Treatment Notes Wound #3 (Foot) Wound Laterality: Dorsal, Left, Proximal Cleanser Soap and Water Discharge Instruction: May shower and wash wound with dial antibacterial soap and water prior to dressing change. Vashe 5.8 (oz) Discharge Instruction: Cleanse the wound with Vashe prior to applying a clean dressing using gauze sponges, not tissue or cotton balls. Peri-Wound Care Triamcinolone 15 (g) Discharge Instruction: Use triamcinolone ****liberally*** Sween Lotion (Moisturizing lotion) Discharge Instruction: Apply moisturizing lotion as directed Topical Triamcinolone Discharge Instruction: Apply Triamcinolone as directed liberallly Primary Dressing [facility] Blue Ready Transfer Foam, 2.5x2.5 (in/in) Burel, Micky [name] ([id number]) [phone].pdf Page 15 of 19 Discharge Instruction: Apply directly to wound bed as directed Santyl Ointment Discharge Instruction: Apply nickel thick amount to wound bed as instructed Secondary Dressing ABD Pad,  8x10 Discharge Instruction: Apply over primary dressing as directed. Woven Gauze Sponge, Non-Sterile 4x4 in Discharge Instruction: Apply over primary dressing as directed. Secured With Compression Wrap Kerlix Roll 4.5x3.1 (in/yd) Discharge Instruction: Apply Kerlix and Coban compression as directed. Coban Self-Adherent Wrap 4x5 (in/yd) Discharge Instruction: Apply over Kerlix as directed. ****UNNA BOOT FIRST LAYER TO UPPER PORTION OF LOWER LEGS AND TO DORSAL FEET TO SECURE WRAPS IN PLACE. Compression Stockings Add-Ons Electronic Signature(s) Signed: 04/13/2023 2:48:47 PM By: Tommie Ard RN Signed: 04/13/2023 5:32:42 PM By: Shawn Stall RN, BSN Entered By: Tommie Ard on 04/13/2023 06:20:53 -------------------------------------------------------------------------------- Wound Assessment Details Patient Name: Date of Service: [name], [name]. 04/13/2023 8:45 A M Medical Record Number: [id number] Patient Account Number: [id number] Date of Birth/Sex: Treating RN: [birth year] (37 y.o. [name], [name] Primary Care Ritik Stavola: Sanda Linger Other Clinician: Referring Case Vassell: Treating Karishma Unrein/Extender: Madelyn [name] in Treatment: 3 Wound Status Wound Number: 4 Primary Etiology: Lymphedema Wound Location: Right, Dorsal Foot Wound Status: Open Wounding Event: Gradually Appeared Comorbid History: Anemia, Sleep Apnea, Vasculitis, Neuropathy Date Acquired: 03/09/2023 Weeks Of Treatment: 3 Clustered Wound: Yes Photos Wound Measurements Length: (cm) 4 Width: (cm) 8 Depth: (cm) 0.2 Clustered Quantity: 5 Area: (cm) 25.133 Volume: (cm) 5.027 Barrientez, Thamar [name] ([id number]) % Reduction in Area: 33.3% % Reduction in Volume: -33.3% Epithelialization: None Tunneling: No Undermining: No [phone].pdf Page 16 of 19 Wound Description Classification: Full Thickness Without Exposed Support Structures Wound Margin: Distinct, outline  attached Exudate Amount: Medium Exudate Type: Serosanguineous Exudate Color: red, brown Foul Odor After Cleansing: No Slough/Fibrino Yes Wound Bed Granulation Amount: Small (1-33%) Exposed Structure Granulation Quality: Red, Pink Fascia Exposed: No Necrotic Amount: Large (67-100%) Fat Layer (Subcutaneous Tissue) Exposed: Yes Necrotic Quality: Eschar, Adherent Slough Tendon Exposed: No Muscle Exposed: No Joint Exposed: No Bone Exposed: No Periwound Skin Texture Texture Color No Abnormalities Noted: No No Abnormalities Noted: No Callus: No Atrophie Blanche: No Crepitus: No Cyanosis: No Excoriation: No Ecchymosis: No Induration: No Erythema: No Rash: No Hemosiderin Staining: No Scarring: No Mottled: No Pallor: No Moisture Rubor: No No Abnormalities Noted: No Dry / Scaly: Yes Maceration: No Treatment Notes Wound #4 (Foot) Wound Laterality: Dorsal, Right Cleanser Soap and Water Discharge Instruction: May shower and wash wound with dial antibacterial soap and water prior to dressing change. Vashe 5.8 (oz) Discharge Instruction: Cleanse the wound with Vashe prior to applying a clean dressing using gauze sponges, not tissue or cotton balls. Peri-Wound Care Triamcinolone 15 (g) Discharge Instruction: Use triamcinolone ****liberally*** Sween Lotion (Moisturizing lotion) Discharge Instruction: Apply moisturizing lotion as directed Topical Triamcinolone Discharge Instruction: Apply Triamcinolone  DARRYL, BLUMENSTEIN [name] ([id number]) [id number] Page 1 of 19 Visit Report for 04/13/2023 Arrival Information Details Patient Name: Date of Service: GA MMA [name] 04/13/2023 8:45 A M Medical Record Number: [id number] Patient Account Number: [id number] Date of Birth/Sex: Treating RN: [name] (37 y.o. [name], [name] Primary Care Jacqualyn Sedgwick: Sanda Linger Other Clinician: Referring Yug Loria: Treating Janelly Switalski/Extender: Madelyn [name] in Treatment: 3 Visit Information History Since Last Visit Added or deleted any medications: No Patient Arrived: Ambulatory Any new allergies or adverse reactions: No Arrival Time: 09:00 Had a fall or experienced change in No Accompanied By: mother activities of daily living that may affect Transfer Assistance: None risk of falls: Patient Identification Verified: Yes Signs or symptoms of abuse/neglect since last visito No Secondary Verification Process Completed: Yes Hospitalized since last visit: No Patient Requires Transmission-Based Precautions: No Implantable device outside of the clinic excluding No Patient Has Alerts: No cellular tissue based products placed in the center since last visit: Has Dressing in Place as Prescribed: Yes Has Compression in Place as Prescribed: No Pain Present Now: No Notes wraps down to ankle. Electronic Signature(s) Signed: 04/13/2023 5:32:42 PM By: Shawn Stall RN, BSN Entered By: Shawn Stall on 04/13/2023 06:08:21 -------------------------------------------------------------------------------- Encounter Discharge Information Details Patient Name: Date of Service: [name], [name]. 04/13/2023 8:45 A M Medical Record Number: [id number] Patient Account Number: [id number] Date of Birth/Sex: Treating RN: [birth year] (37 y.o. [name], [name] Primary Care Oris Calmes: Sanda Linger Other Clinician: Referring Raffaella Edison: Treating Jisel Fleet/Extender: Madelyn [name] in Treatment: 3 Encounter Discharge Information Items Post Procedure Vitals Discharge Condition: Stable Temperature (F): 98.3 Ambulatory Status: Ambulatory Pulse (bpm): 114 Discharge Destination: Home Respiratory Rate (breaths/min): 20 Transportation: Private Auto Blood Pressure (mmHg): 163/87 Accompanied By: mother Schedule Follow-up Appointment: Yes Clinical Summary of Care: Electronic Signature(s) Signed: 04/13/2023 5:32:42 PM By: Shawn Stall RN, BSN Entered By: Shawn Stall on 04/13/2023 06:52:27 [name] ([id number]) [id number] Page 2 of 19 -------------------------------------------------------------------------------- Lower Extremity Assessment Details Patient Name: Date of Service: GA MMA [name] 04/13/2023 8:45 A M Medical Record Number: [id number] Patient Account Number: [id number] Date of Birth/Sex: Treating RN: [name][birth year] (37 y.o. [name], [name] Primary Care Zacheriah Stumpe: Sanda Linger Other Clinician: Referring Delta Deshmukh: Treating Viona Hosking/Extender: Madelyn [name] in Treatment: 3 Edema Assessment Assessed: Kyra Searles: Yes] Franne Forts: Yes] Edema: [Left: Yes] [Right: Yes] Calf Left: Right: Point of Measurement: 39 cm From Medial Instep 49 cm 49 cm Ankle Left: Right: Point of Measurement: 9 cm From Medial Instep 25 cm 25 cm Vascular Assessment Pulses: Dorsalis Pedis Palpable: [Left:Yes] Extremity colors, hair growth, and conditions: Extremity Color: [Left:Hyperpigmented] [Right:Hyperpigmented] Hair Growth on Extremity: [Left:No] [Right:No] Temperature of Extremity: [Left:Warm] [Right:Warm] Capillary Refill: [Left:< 3 seconds] [Right:< 3 seconds] Dependent Rubor: [Left:No] [Right:No] Blanched when Elevated: [Left:No No] [Right:No No] Toe Nail Assessment Left: Right: Thick: No No Discolored: No No Deformed: No No Improper Length and Hygiene: No No Electronic  Signature(s) Signed: 04/13/2023 5:32:42 PM By: Shawn Stall RN, BSN Entered By: Shawn Stall on 04/13/2023 06:08:54 -------------------------------------------------------------------------------- Multi Wound Chart Details Patient Name: Date of Service: [name], [name]. 04/13/2023 8:45 A M Medical Record Number: [id number] Patient Account Number: [id number] Date of Birth/Sex: Treating RN: [name][birth year] (37 y.o. F) Primary Care Kura Bethards: Sanda Linger Other Clinician: Referring Halden Phegley: Treating Casimira Sutphin/Extender: Madelyn [name] in Treatment: 3 Vital Signs Height(in): 69 Pulse(bpm): 114 Weight(lbs): 272 Blood Pressure(mmHg): 136/87 FIDELA, CIESLAK [name] ([id number]) [phone].pdf Page 3  DARRYL, BLUMENSTEIN [name] ([id number]) [id number] Page 1 of 19 Visit Report for 04/13/2023 Arrival Information Details Patient Name: Date of Service: GA MMA [name] 04/13/2023 8:45 A M Medical Record Number: [id number] Patient Account Number: [id number] Date of Birth/Sex: Treating RN: [name] (37 y.o. [name], [name] Primary Care Jacqualyn Sedgwick: Sanda Linger Other Clinician: Referring Yug Loria: Treating Janelly Switalski/Extender: Madelyn [name] in Treatment: 3 Visit Information History Since Last Visit Added or deleted any medications: No Patient Arrived: Ambulatory Any new allergies or adverse reactions: No Arrival Time: 09:00 Had a fall or experienced change in No Accompanied By: mother activities of daily living that may affect Transfer Assistance: None risk of falls: Patient Identification Verified: Yes Signs or symptoms of abuse/neglect since last visito No Secondary Verification Process Completed: Yes Hospitalized since last visit: No Patient Requires Transmission-Based Precautions: No Implantable device outside of the clinic excluding No Patient Has Alerts: No cellular tissue based products placed in the center since last visit: Has Dressing in Place as Prescribed: Yes Has Compression in Place as Prescribed: No Pain Present Now: No Notes wraps down to ankle. Electronic Signature(s) Signed: 04/13/2023 5:32:42 PM By: Shawn Stall RN, BSN Entered By: Shawn Stall on 04/13/2023 06:08:21 -------------------------------------------------------------------------------- Encounter Discharge Information Details Patient Name: Date of Service: [name], [name]. 04/13/2023 8:45 A M Medical Record Number: [id number] Patient Account Number: [id number] Date of Birth/Sex: Treating RN: [birth year] (37 y.o. [name], [name] Primary Care Oris Calmes: Sanda Linger Other Clinician: Referring Raffaella Edison: Treating Jisel Fleet/Extender: Madelyn [name] in Treatment: 3 Encounter Discharge Information Items Post Procedure Vitals Discharge Condition: Stable Temperature (F): 98.3 Ambulatory Status: Ambulatory Pulse (bpm): 114 Discharge Destination: Home Respiratory Rate (breaths/min): 20 Transportation: Private Auto Blood Pressure (mmHg): 163/87 Accompanied By: mother Schedule Follow-up Appointment: Yes Clinical Summary of Care: Electronic Signature(s) Signed: 04/13/2023 5:32:42 PM By: Shawn Stall RN, BSN Entered By: Shawn Stall on 04/13/2023 06:52:27 [name] ([id number]) [id number] Page 2 of 19 -------------------------------------------------------------------------------- Lower Extremity Assessment Details Patient Name: Date of Service: GA MMA [name] 04/13/2023 8:45 A M Medical Record Number: [id number] Patient Account Number: [id number] Date of Birth/Sex: Treating RN: [name][birth year] (37 y.o. [name], [name] Primary Care Zacheriah Stumpe: Sanda Linger Other Clinician: Referring Delta Deshmukh: Treating Viona Hosking/Extender: Madelyn [name] in Treatment: 3 Edema Assessment Assessed: Kyra Searles: Yes] Franne Forts: Yes] Edema: [Left: Yes] [Right: Yes] Calf Left: Right: Point of Measurement: 39 cm From Medial Instep 49 cm 49 cm Ankle Left: Right: Point of Measurement: 9 cm From Medial Instep 25 cm 25 cm Vascular Assessment Pulses: Dorsalis Pedis Palpable: [Left:Yes] Extremity colors, hair growth, and conditions: Extremity Color: [Left:Hyperpigmented] [Right:Hyperpigmented] Hair Growth on Extremity: [Left:No] [Right:No] Temperature of Extremity: [Left:Warm] [Right:Warm] Capillary Refill: [Left:< 3 seconds] [Right:< 3 seconds] Dependent Rubor: [Left:No] [Right:No] Blanched when Elevated: [Left:No No] [Right:No No] Toe Nail Assessment Left: Right: Thick: No No Discolored: No No Deformed: No No Improper Length and Hygiene: No No Electronic  Signature(s) Signed: 04/13/2023 5:32:42 PM By: Shawn Stall RN, BSN Entered By: Shawn Stall on 04/13/2023 06:08:54 -------------------------------------------------------------------------------- Multi Wound Chart Details Patient Name: Date of Service: [name], [name]. 04/13/2023 8:45 A M Medical Record Number: [id number] Patient Account Number: [id number] Date of Birth/Sex: Treating RN: [name][birth year] (37 y.o. F) Primary Care Kura Bethards: Sanda Linger Other Clinician: Referring Halden Phegley: Treating Casimira Sutphin/Extender: Madelyn [name] in Treatment: 3 Vital Signs Height(in): 69 Pulse(bpm): 114 Weight(lbs): 272 Blood Pressure(mmHg): 136/87 FIDELA, CIESLAK [name] ([id number]) [phone].pdf Page 3

## 2023-04-16 ENCOUNTER — Encounter (HOSPITAL_BASED_OUTPATIENT_CLINIC_OR_DEPARTMENT_OTHER): Payer: BC Managed Care – PPO | Admitting: General Surgery

## 2023-04-16 ENCOUNTER — Telehealth: Payer: Self-pay | Admitting: Internal Medicine

## 2023-04-16 DIAGNOSIS — L409 Psoriasis, unspecified: Secondary | ICD-10-CM | POA: Diagnosis not present

## 2023-04-16 DIAGNOSIS — L958 Other vasculitis limited to the skin: Secondary | ICD-10-CM | POA: Diagnosis not present

## 2023-04-16 DIAGNOSIS — L97518 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 severity: Secondary | ICD-10-CM | POA: Diagnosis not present

## 2023-04-16 DIAGNOSIS — L97528 Non-pressure chronic ulcer of other part of left foot with other specified severity: Secondary | ICD-10-CM | POA: Diagnosis not present

## 2023-04-16 DIAGNOSIS — Z79620 Long term (current) use of immunosuppressive biologic: Secondary | ICD-10-CM | POA: Diagnosis not present

## 2023-04-16 NOTE — Telephone Encounter (Signed)
Disability paperwork received via fax & placed in provider's box up front.

## 2023-04-16 NOTE — Progress Notes (Signed)
ADDY, MCMANNIS ([id number]) [id number] Page 1 of 1 Visit Report for 04/16/2023 SuperBill Details Patient Name: [name], [name] NDRA R. Date of Service: 04/16/2023 Medical Record Number: [id number] Patient Account Number: [id number] Date of Birth/Sex: Treating RN: [birth year] (37 y.o. 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

## 2023-04-16 NOTE — Progress Notes (Signed)
SIMMONE, [name] ([id number]) [id number] Page 1 of 10 Visit Report for 04/16/2023 Arrival Information Details Patient Name: Date of Service: [name] MMA [name] [name]. 04/16/2023 8:45 A M Medical Record Number: [id number] Patient Account Number: [id number] Date of Birth/Sex: Treating RN: 27-Sep-1985 (37 y.o. F) Primary Care Caeli Linehan: Sanda Linger Other Clinician: Referring Hudsyn Champine: Treating Lasheka Kempner/Extender: Royston Sinner in Treatment: 3 Visit Information History Since Last Visit Added or deleted any medications: No Patient Arrived: Ambulatory Any new allergies or adverse reactions: No Arrival Time: 09:06 Had a fall or experienced change in No Accompanied By: mom activities of daily living that may affect Transfer Assistance: None risk of falls: Patient Identification Verified: Yes Signs or symptoms of abuse/neglect since last visito No Secondary Verification Process Completed: Yes Hospitalized since last visit: No Patient Requires Transmission-Based Precautions: No Implantable device outside of the clinic excluding No Patient Has Alerts: No cellular tissue based products placed in the center since last visit: Has Dressing in Place as Prescribed: Yes Has Compression in Place as Prescribed: Yes Pain Present Now: No Electronic Signature(s) Signed: 04/16/2023 12:17:31 PM By: Thayer Dallas Entered By: Thayer Dallas on 04/16/2023 09:06:53 -------------------------------------------------------------------------------- Clinic Level of Care Assessment Details Patient Name: Date of Service: [name], [name] [name]. 04/16/2023 8:45 A M Medical Record Number: [id number] Patient Account Number: [id number] Date of Birth/Sex: Treating RN: June 22, 1986 (37 y.o. F) Primary Care Marayah Higdon: Sanda Linger Other Clinician: Thayer Dallas Referring Micki [name]: Treating Zakary Kimura/Extender: Royston Sinner in Treatment: 3 Clinic  Level of Care Assessment Items TOOL 4 Quantity Score X- 1 0 Use when only an EandM is performed on FOLLOW-UP visit ASSESSMENTS - Nursing Assessment / Reassessment X- 1 10 Reassessment of Co-morbidities (includes updates in patient status) X- 1 5 Reassessment of Adherence to Treatment Plan ASSESSMENTS - Wound and Skin A ssessment / Reassessment []  - 0 Simple Wound Assessment / Reassessment - one wound []  - 0 Complex Wound Assessment / Reassessment - multiple wounds []  - 0 Dermatologic / Skin Assessment (not related to wound area) ASSESSMENTS - Focused Assessment []  - 0 Circumferential Edema Measurements - multi extremities []  - 0 Nutritional Assessment / Counseling / Intervention JULEE, STOLL ([id number]) [id number] Page 2 of 10 []  - 0 Lower Extremity Assessment (monofilament, tuning fork, pulses) []  - 0 Peripheral Arterial Disease Assessment (using hand held doppler) ASSESSMENTS - Ostomy and/or Continence Assessment and Care []  - 0 Incontinence Assessment and Management []  - 0 Ostomy Care Assessment and Management (repouching, etc.) PROCESS - Coordination of Care X - Simple Patient / Family Education for ongoing care 1 15 []  - 0 Complex (extensive) Patient / Family Education for ongoing care []  - 0 Staff obtains Chiropractor, Records, T Results / Process Orders est []  - 0 Staff telephones HHA, Nursing Homes / Clarify orders / etc []  - 0 Routine Transfer to another Facility (non-emergent condition) []  - 0 Routine Hospital Admission (non-emergent condition) []  - 0 New Admissions / Manufacturing engineer / Ordering NPWT Apligraf, etc. , []  - 0 Emergency Hospital Admission (emergent condition) X- 1 10 Simple Discharge Coordination []  - 0 Complex (extensive) Discharge Coordination PROCESS - Special Needs []  - 0 Pediatric / Minor Patient Management []  - 0 Isolation Patient Management []  - 0 Hearing / Language / Visual special  needs []  - 0 Assessment of Community assistance (transportation, D/C planning, etc.) []  - 0 Additional assistance / Altered mentation []  - 0 Support Surface(s) Assessment (bed, cushion, seat, etc.) INTERVENTIONS - Wound  M Medical Record Number: [id number] Patient Account Number: [id number] Date of Birth/Sex: Treating RN: [birth year] (37 y.o. F) Primary Care Dael Howland: Sanda Linger Other Clinician: Referring [name]: Treating Capitola Ladson/Extender: Royston Sinner in Treatment: 3 Wound Status Wound Number: 3 Primary Etiology: Lymphedema Wound Location: Left, Proximal, Dorsal Foot Wound Status: Open Wounding Event: Gradually Appeared Date Acquired: 03/09/2023 Weeks Of Treatment: 3 Clustered Wound: Yes Wound Measurements Length: (cm)  7.5 Width: (cm) 8 Depth: (cm) 0.2 Area: (cm) 47.124 Volume: (cm) 9.425 % Reduction in Area: -188.5% % Reduction in Volume: -188.5% Wound Description Classification: Unclassifiable [name], [name] ([id number]) Exudate Amount: Medium Exudate Type: Serosanguineous Exudate Color: red, brown [id number] Page 7 of 10 Periwound Skin Texture Texture Color No Abnormalities Noted: No No Abnormalities Noted: No Moisture No Abnormalities Noted: No Treatment Notes Wound #3 (Foot) Wound Laterality: Dorsal, Left, Proximal Cleanser Soap and Water Discharge Instruction: May shower and wash wound with dial antibacterial soap and water prior to dressing change. Vashe 5.8 (oz) Discharge Instruction: Cleanse the wound with Vashe prior to applying a clean dressing using gauze sponges, not tissue or cotton balls. Peri-Wound Care Triamcinolone 15 (g) Discharge Instruction: Use triamcinolone ****liberally*** Sween Lotion (Moisturizing lotion) Discharge Instruction: Apply moisturizing lotion as directed Topical Triamcinolone Discharge Instruction: Apply Triamcinolone as directed liberallly Primary Dressing Hydrofera Blue Ready Transfer Foam, 2.5x2.5 (in/in) Discharge Instruction: Apply directly to wound bed as directed Santyl Ointment Discharge Instruction: Apply nickel thick amount to wound bed as instructed Secondary Dressing ABD Pad, 8x10 Discharge Instruction: Apply over primary dressing as directed. Woven Gauze Sponge, Non-Sterile 4x4 in Discharge Instruction: Apply over primary dressing as directed. Secured With Compression Wrap Kerlix Roll 4.5x3.1 (in/yd) Discharge Instruction: Apply Kerlix and Coban compression as directed. Coban Self-Adherent Wrap 4x5 (in/yd) Discharge Instruction: Apply over Kerlix as directed. ****UNNA BOOT FIRST LAYER TO UPPER PORTION OF LOWER LEGS AND TO DORSAL FEET TO SECURE WRAPS IN PLACE. Compression Stockings Add-Ons Electronic  Signature(s) Signed: 04/16/2023 12:17:31 PM By: Thayer Dallas Entered By: Thayer Dallas on 04/16/2023 09:07:43 -------------------------------------------------------------------------------- Wound Assessment Details Patient Name: Date of Service: [name], [name] [name]. 04/16/2023 8:45 A M Medical Record Number: [id number] Patient Account Number: [id number] Date of Birth/Sex: Treating RN: [birth year] (37 y.o. F) Primary Care Jigar Zielke: Sanda Linger Other Clinician: [name] ([id number]) [id number] Page 8 of 10 Referring Cleta Heatley: Treating Daemyn Gariepy/Extender: Royston Sinner in Treatment: 3 Wound Status Wound Number: 4 Primary Etiology: Lymphedema Wound Location: Right, Dorsal Foot Wound Status: Open Wounding Event: Gradually Appeared Date Acquired: 03/09/2023 Weeks Of Treatment: 3 Clustered Wound: Yes Wound Measurements Length: (cm) 4 Width: (cm) 8 Depth: (cm) 0.2 Area: (cm) 25.133 Volume: (cm) 5.027 % Reduction in Area: 33.3% % Reduction in Volume: -33.3% Wound Description Classification: Full Thickness Without Exposed Support Exudate Amount: Medium Exudate Type: Serosanguineous Exudate Color: red, brown Structures Periwound Skin Texture Texture Color No Abnormalities Noted: No No Abnormalities Noted: No Moisture No Abnormalities Noted: No Treatment Notes Wound #4 (Foot) Wound Laterality: Dorsal, Right Cleanser Soap and Water Discharge Instruction: May shower and wash wound with dial antibacterial soap and water prior to dressing change. Vashe 5.8 (oz) Discharge Instruction: Cleanse the wound with Vashe prior to applying a clean dressing using gauze sponges, not tissue or cotton balls. Peri-Wound Care Triamcinolone 15 (g) Discharge Instruction: Use triamcinolone ****liberally*** Sween Lotion (Moisturizing lotion) Discharge Instruction: Apply moisturizing lotion as directed Topical Triamcinolone Discharge  Instruction: Apply Triamcinolone as directed liberallly Primary Dressing Hydrofera Blue Ready Transfer Foam, 2.5x2.5 (in/in)  M Medical Record Number: [id number] Patient Account Number: [id number] Date of Birth/Sex: Treating RN: [birth year] (37 y.o. F) Primary Care Dael Howland: Sanda Linger Other Clinician: Referring [name]: Treating Capitola Ladson/Extender: Royston Sinner in Treatment: 3 Wound Status Wound Number: 3 Primary Etiology: Lymphedema Wound Location: Left, Proximal, Dorsal Foot Wound Status: Open Wounding Event: Gradually Appeared Date Acquired: 03/09/2023 Weeks Of Treatment: 3 Clustered Wound: Yes Wound Measurements Length: (cm)  7.5 Width: (cm) 8 Depth: (cm) 0.2 Area: (cm) 47.124 Volume: (cm) 9.425 % Reduction in Area: -188.5% % Reduction in Volume: -188.5% Wound Description Classification: Unclassifiable [name], [name] ([id number]) Exudate Amount: Medium Exudate Type: Serosanguineous Exudate Color: red, brown [id number] Page 7 of 10 Periwound Skin Texture Texture Color No Abnormalities Noted: No No Abnormalities Noted: No Moisture No Abnormalities Noted: No Treatment Notes Wound #3 (Foot) Wound Laterality: Dorsal, Left, Proximal Cleanser Soap and Water Discharge Instruction: May shower and wash wound with dial antibacterial soap and water prior to dressing change. Vashe 5.8 (oz) Discharge Instruction: Cleanse the wound with Vashe prior to applying a clean dressing using gauze sponges, not tissue or cotton balls. Peri-Wound Care Triamcinolone 15 (g) Discharge Instruction: Use triamcinolone ****liberally*** Sween Lotion (Moisturizing lotion) Discharge Instruction: Apply moisturizing lotion as directed Topical Triamcinolone Discharge Instruction: Apply Triamcinolone as directed liberallly Primary Dressing Hydrofera Blue Ready Transfer Foam, 2.5x2.5 (in/in) Discharge Instruction: Apply directly to wound bed as directed Santyl Ointment Discharge Instruction: Apply nickel thick amount to wound bed as instructed Secondary Dressing ABD Pad, 8x10 Discharge Instruction: Apply over primary dressing as directed. Woven Gauze Sponge, Non-Sterile 4x4 in Discharge Instruction: Apply over primary dressing as directed. Secured With Compression Wrap Kerlix Roll 4.5x3.1 (in/yd) Discharge Instruction: Apply Kerlix and Coban compression as directed. Coban Self-Adherent Wrap 4x5 (in/yd) Discharge Instruction: Apply over Kerlix as directed. ****UNNA BOOT FIRST LAYER TO UPPER PORTION OF LOWER LEGS AND TO DORSAL FEET TO SECURE WRAPS IN PLACE. Compression Stockings Add-Ons Electronic  Signature(s) Signed: 04/16/2023 12:17:31 PM By: Thayer Dallas Entered By: Thayer Dallas on 04/16/2023 09:07:43 -------------------------------------------------------------------------------- Wound Assessment Details Patient Name: Date of Service: [name], [name] [name]. 04/16/2023 8:45 A M Medical Record Number: [id number] Patient Account Number: [id number] Date of Birth/Sex: Treating RN: [birth year] (37 y.o. F) Primary Care Jigar Zielke: Sanda Linger Other Clinician: [name] ([id number]) [id number] Page 8 of 10 Referring Cleta Heatley: Treating Daemyn Gariepy/Extender: Royston Sinner in Treatment: 3 Wound Status Wound Number: 4 Primary Etiology: Lymphedema Wound Location: Right, Dorsal Foot Wound Status: Open Wounding Event: Gradually Appeared Date Acquired: 03/09/2023 Weeks Of Treatment: 3 Clustered Wound: Yes Wound Measurements Length: (cm) 4 Width: (cm) 8 Depth: (cm) 0.2 Area: (cm) 25.133 Volume: (cm) 5.027 % Reduction in Area: 33.3% % Reduction in Volume: -33.3% Wound Description Classification: Full Thickness Without Exposed Support Exudate Amount: Medium Exudate Type: Serosanguineous Exudate Color: red, brown Structures Periwound Skin Texture Texture Color No Abnormalities Noted: No No Abnormalities Noted: No Moisture No Abnormalities Noted: No Treatment Notes Wound #4 (Foot) Wound Laterality: Dorsal, Right Cleanser Soap and Water Discharge Instruction: May shower and wash wound with dial antibacterial soap and water prior to dressing change. Vashe 5.8 (oz) Discharge Instruction: Cleanse the wound with Vashe prior to applying a clean dressing using gauze sponges, not tissue or cotton balls. Peri-Wound Care Triamcinolone 15 (g) Discharge Instruction: Use triamcinolone ****liberally*** Sween Lotion (Moisturizing lotion) Discharge Instruction: Apply moisturizing lotion as directed Topical Triamcinolone Discharge  Instruction: Apply Triamcinolone as directed liberallly Primary Dressing Hydrofera Blue Ready Transfer Foam, 2.5x2.5 (in/in)  M Medical Record Number: [id number] Patient Account Number: [id number] Date of Birth/Sex: Treating RN: [birth year] (37 y.o. F) Primary Care Dael Howland: Sanda Linger Other Clinician: Referring [name]: Treating Capitola Ladson/Extender: Royston Sinner in Treatment: 3 Wound Status Wound Number: 3 Primary Etiology: Lymphedema Wound Location: Left, Proximal, Dorsal Foot Wound Status: Open Wounding Event: Gradually Appeared Date Acquired: 03/09/2023 Weeks Of Treatment: 3 Clustered Wound: Yes Wound Measurements Length: (cm)  7.5 Width: (cm) 8 Depth: (cm) 0.2 Area: (cm) 47.124 Volume: (cm) 9.425 % Reduction in Area: -188.5% % Reduction in Volume: -188.5% Wound Description Classification: Unclassifiable [name], [name] ([id number]) Exudate Amount: Medium Exudate Type: Serosanguineous Exudate Color: red, brown [id number] Page 7 of 10 Periwound Skin Texture Texture Color No Abnormalities Noted: No No Abnormalities Noted: No Moisture No Abnormalities Noted: No Treatment Notes Wound #3 (Foot) Wound Laterality: Dorsal, Left, Proximal Cleanser Soap and Water Discharge Instruction: May shower and wash wound with dial antibacterial soap and water prior to dressing change. Vashe 5.8 (oz) Discharge Instruction: Cleanse the wound with Vashe prior to applying a clean dressing using gauze sponges, not tissue or cotton balls. Peri-Wound Care Triamcinolone 15 (g) Discharge Instruction: Use triamcinolone ****liberally*** Sween Lotion (Moisturizing lotion) Discharge Instruction: Apply moisturizing lotion as directed Topical Triamcinolone Discharge Instruction: Apply Triamcinolone as directed liberallly Primary Dressing Hydrofera Blue Ready Transfer Foam, 2.5x2.5 (in/in) Discharge Instruction: Apply directly to wound bed as directed Santyl Ointment Discharge Instruction: Apply nickel thick amount to wound bed as instructed Secondary Dressing ABD Pad, 8x10 Discharge Instruction: Apply over primary dressing as directed. Woven Gauze Sponge, Non-Sterile 4x4 in Discharge Instruction: Apply over primary dressing as directed. Secured With Compression Wrap Kerlix Roll 4.5x3.1 (in/yd) Discharge Instruction: Apply Kerlix and Coban compression as directed. Coban Self-Adherent Wrap 4x5 (in/yd) Discharge Instruction: Apply over Kerlix as directed. ****UNNA BOOT FIRST LAYER TO UPPER PORTION OF LOWER LEGS AND TO DORSAL FEET TO SECURE WRAPS IN PLACE. Compression Stockings Add-Ons Electronic  Signature(s) Signed: 04/16/2023 12:17:31 PM By: Thayer Dallas Entered By: Thayer Dallas on 04/16/2023 09:07:43 -------------------------------------------------------------------------------- Wound Assessment Details Patient Name: Date of Service: [name], [name] [name]. 04/16/2023 8:45 A M Medical Record Number: [id number] Patient Account Number: [id number] Date of Birth/Sex: Treating RN: [birth year] (37 y.o. F) Primary Care Jigar Zielke: Sanda Linger Other Clinician: [name] ([id number]) [id number] Page 8 of 10 Referring Cleta Heatley: Treating Daemyn Gariepy/Extender: Royston Sinner in Treatment: 3 Wound Status Wound Number: 4 Primary Etiology: Lymphedema Wound Location: Right, Dorsal Foot Wound Status: Open Wounding Event: Gradually Appeared Date Acquired: 03/09/2023 Weeks Of Treatment: 3 Clustered Wound: Yes Wound Measurements Length: (cm) 4 Width: (cm) 8 Depth: (cm) 0.2 Area: (cm) 25.133 Volume: (cm) 5.027 % Reduction in Area: 33.3% % Reduction in Volume: -33.3% Wound Description Classification: Full Thickness Without Exposed Support Exudate Amount: Medium Exudate Type: Serosanguineous Exudate Color: red, brown Structures Periwound Skin Texture Texture Color No Abnormalities Noted: No No Abnormalities Noted: No Moisture No Abnormalities Noted: No Treatment Notes Wound #4 (Foot) Wound Laterality: Dorsal, Right Cleanser Soap and Water Discharge Instruction: May shower and wash wound with dial antibacterial soap and water prior to dressing change. Vashe 5.8 (oz) Discharge Instruction: Cleanse the wound with Vashe prior to applying a clean dressing using gauze sponges, not tissue or cotton balls. Peri-Wound Care Triamcinolone 15 (g) Discharge Instruction: Use triamcinolone ****liberally*** Sween Lotion (Moisturizing lotion) Discharge Instruction: Apply moisturizing lotion as directed Topical Triamcinolone Discharge  Instruction: Apply Triamcinolone as directed liberallly Primary Dressing Hydrofera Blue Ready Transfer Foam, 2.5x2.5 (in/in)  M Medical Record Number: [id number] Patient Account Number: [id number] Date of Birth/Sex: Treating RN: [birth year] (37 y.o. F) Primary Care Dael Howland: Sanda Linger Other Clinician: Referring [name]: Treating Capitola Ladson/Extender: Royston Sinner in Treatment: 3 Wound Status Wound Number: 3 Primary Etiology: Lymphedema Wound Location: Left, Proximal, Dorsal Foot Wound Status: Open Wounding Event: Gradually Appeared Date Acquired: 03/09/2023 Weeks Of Treatment: 3 Clustered Wound: Yes Wound Measurements Length: (cm)  7.5 Width: (cm) 8 Depth: (cm) 0.2 Area: (cm) 47.124 Volume: (cm) 9.425 % Reduction in Area: -188.5% % Reduction in Volume: -188.5% Wound Description Classification: Unclassifiable [name], [name] ([id number]) Exudate Amount: Medium Exudate Type: Serosanguineous Exudate Color: red, brown [id number] Page 7 of 10 Periwound Skin Texture Texture Color No Abnormalities Noted: No No Abnormalities Noted: No Moisture No Abnormalities Noted: No Treatment Notes Wound #3 (Foot) Wound Laterality: Dorsal, Left, Proximal Cleanser Soap and Water Discharge Instruction: May shower and wash wound with dial antibacterial soap and water prior to dressing change. Vashe 5.8 (oz) Discharge Instruction: Cleanse the wound with Vashe prior to applying a clean dressing using gauze sponges, not tissue or cotton balls. Peri-Wound Care Triamcinolone 15 (g) Discharge Instruction: Use triamcinolone ****liberally*** Sween Lotion (Moisturizing lotion) Discharge Instruction: Apply moisturizing lotion as directed Topical Triamcinolone Discharge Instruction: Apply Triamcinolone as directed liberallly Primary Dressing Hydrofera Blue Ready Transfer Foam, 2.5x2.5 (in/in) Discharge Instruction: Apply directly to wound bed as directed Santyl Ointment Discharge Instruction: Apply nickel thick amount to wound bed as instructed Secondary Dressing ABD Pad, 8x10 Discharge Instruction: Apply over primary dressing as directed. Woven Gauze Sponge, Non-Sterile 4x4 in Discharge Instruction: Apply over primary dressing as directed. Secured With Compression Wrap Kerlix Roll 4.5x3.1 (in/yd) Discharge Instruction: Apply Kerlix and Coban compression as directed. Coban Self-Adherent Wrap 4x5 (in/yd) Discharge Instruction: Apply over Kerlix as directed. ****UNNA BOOT FIRST LAYER TO UPPER PORTION OF LOWER LEGS AND TO DORSAL FEET TO SECURE WRAPS IN PLACE. Compression Stockings Add-Ons Electronic  Signature(s) Signed: 04/16/2023 12:17:31 PM By: Thayer Dallas Entered By: Thayer Dallas on 04/16/2023 09:07:43 -------------------------------------------------------------------------------- Wound Assessment Details Patient Name: Date of Service: [name], [name] [name]. 04/16/2023 8:45 A M Medical Record Number: [id number] Patient Account Number: [id number] Date of Birth/Sex: Treating RN: [birth year] (37 y.o. F) Primary Care Jigar Zielke: Sanda Linger Other Clinician: [name] ([id number]) [id number] Page 8 of 10 Referring Cleta Heatley: Treating Daemyn Gariepy/Extender: Royston Sinner in Treatment: 3 Wound Status Wound Number: 4 Primary Etiology: Lymphedema Wound Location: Right, Dorsal Foot Wound Status: Open Wounding Event: Gradually Appeared Date Acquired: 03/09/2023 Weeks Of Treatment: 3 Clustered Wound: Yes Wound Measurements Length: (cm) 4 Width: (cm) 8 Depth: (cm) 0.2 Area: (cm) 25.133 Volume: (cm) 5.027 % Reduction in Area: 33.3% % Reduction in Volume: -33.3% Wound Description Classification: Full Thickness Without Exposed Support Exudate Amount: Medium Exudate Type: Serosanguineous Exudate Color: red, brown Structures Periwound Skin Texture Texture Color No Abnormalities Noted: No No Abnormalities Noted: No Moisture No Abnormalities Noted: No Treatment Notes Wound #4 (Foot) Wound Laterality: Dorsal, Right Cleanser Soap and Water Discharge Instruction: May shower and wash wound with dial antibacterial soap and water prior to dressing change. Vashe 5.8 (oz) Discharge Instruction: Cleanse the wound with Vashe prior to applying a clean dressing using gauze sponges, not tissue or cotton balls. Peri-Wound Care Triamcinolone 15 (g) Discharge Instruction: Use triamcinolone ****liberally*** Sween Lotion (Moisturizing lotion) Discharge Instruction: Apply moisturizing lotion as directed Topical Triamcinolone Discharge  Instruction: Apply Triamcinolone as directed liberallly Primary Dressing Hydrofera Blue Ready Transfer Foam, 2.5x2.5 (in/in)

## 2023-04-19 DIAGNOSIS — L400 Psoriasis vulgaris: Secondary | ICD-10-CM | POA: Diagnosis not present

## 2023-04-19 DIAGNOSIS — Z79899 Other long term (current) drug therapy: Secondary | ICD-10-CM | POA: Diagnosis not present

## 2023-04-19 NOTE — Telephone Encounter (Signed)
Forms provided were asking for updated clinical notes from 04/11/23 forward.  Pt has not been seen since 03/30/23.  No new information to provide. A note was wrote on paperwork stating the above and sent back.

## 2023-04-20 ENCOUNTER — Encounter (HOSPITAL_BASED_OUTPATIENT_CLINIC_OR_DEPARTMENT_OTHER): Payer: BC Managed Care – PPO | Admitting: Internal Medicine

## 2023-04-20 DIAGNOSIS — Z79620 Long term (current) use of immunosuppressive biologic: Secondary | ICD-10-CM | POA: Diagnosis not present

## 2023-04-20 DIAGNOSIS — L97518 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 severity: Secondary | ICD-10-CM | POA: Diagnosis not present

## 2023-04-20 DIAGNOSIS — L97528 Non-pressure chronic ulcer of other part of left foot with other specified severity: Secondary | ICD-10-CM | POA: Diagnosis not present

## 2023-04-20 DIAGNOSIS — L409 Psoriasis, unspecified: Secondary | ICD-10-CM | POA: Diagnosis not present

## 2023-04-20 DIAGNOSIS — L958 Other vasculitis limited to the skin: Secondary | ICD-10-CM | POA: Diagnosis not present

## 2023-04-20 DIAGNOSIS — I890 Lymphedema, not elsewhere classified: Secondary | ICD-10-CM | POA: Diagnosis not present

## 2023-04-21 ENCOUNTER — Other Ambulatory Visit: Payer: BC Managed Care – PPO

## 2023-04-21 ENCOUNTER — Telehealth: Payer: Self-pay | Admitting: Pharmacist

## 2023-04-21 NOTE — Progress Notes (Signed)
nickel thick amount to wound bed as instructed Secondary Dressing ABD Pad, 8x10 Discharge Instruction: Apply over primary dressing as directed. Woven Gauze Sponge, Non-Sterile 4x4 in Discharge Instruction: Apply over primary dressing as directed. Secured With Compression Wrap Kerlix Roll 4.5x3.1 (in/yd) Discharge Instruction: Apply Kerlix and Coban compression as directed. Coban Self-Adherent Wrap 4x5 (in/yd) Discharge Instruction: Apply over Kerlix as directed. ****UNNA BOOT FIRST LAYER TO UPPER PORTION OF LOWER LEGS AND TO DORSAL FEET TO SECURE WRAPS IN PLACE. [name], [name] ([id number]) [id number] Page 18 of  18 Compression Stockings Add-Ons Electronic Signature(s) Signed: 04/21/2023 4:07:14 PM By: Brenton Grills Entered By: Brenton Grills on 04/20/2023 07:50:23 -------------------------------------------------------------------------------- Vitals Details Patient Name: Date of Service: [name], [name]. 04/20/2023 10:30 A M Medical Record Number: [id number] Patient Account Number: [id number] Date of Birth/Sex: Treating RN: 1986-04-04 (37 y.o. [name] Primary Care Joleah Kosak: Sanda Linger Other Clinician: Referring Scotty Pinder: Treating Khrystina Bonnes/Extender: Madelyn Flavors in Treatment: 4 Vital Signs Time Taken: 10:32 Temperature (F): 98.5 Height (in): 69 Pulse (bpm): 121 Weight (lbs): 272 Respiratory Rate (breaths/min): 18 Body Mass Index (BMI): 40.2 Blood Pressure (mmHg): 147/98 Reference Range: 80 - 120 mg / dl Electronic Signature(s) Signed: 04/21/2023 4:07:14 PM By: Brenton Grills Entered By: Brenton Grills on 04/20/2023 07:34:15  [name], [name] ([id number]) [id number] Page 1 of 18 Visit Report for 04/20/2023 Arrival Information Details Patient Name: Date of Service: GA MMA [name] 04/20/2023 10:30 A M Medical Record Number: [id number] Patient Account Number: [id number] Date of Birth/Sex: Treating RN: [birth year] (37 y.o. [name] Primary Care Arial Galligan: Sanda Linger Other Clinician: Referring Sharlyn Odonnel: Treating Onix Jumper/Extender: Madelyn Flavors in Treatment: 4 Visit Information History Since Last Visit All ordered tests and consults were completed: Yes Patient Arrived: Ambulatory Added or deleted any medications: No Arrival Time: 10:32 Any new allergies or adverse reactions: No Accompanied By: mother Had a fall or experienced change in No Transfer Assistance: None activities of daily living that may affect Patient Identification Verified: Yes risk of falls: Secondary Verification Process Completed: Yes Signs or symptoms of abuse/neglect since last visito No Patient Requires Transmission-Based Precautions: No Hospitalized since last visit: No Patient Has Alerts: No Implantable device outside of the clinic excluding No cellular tissue based products placed in the center since last visit: Has Dressing in Place as Prescribed: Yes Pain Present Now: No Electronic Signature(s) Signed: 04/21/2023 4:07:14 PM By: Brenton Grills Entered By: Brenton Grills on 04/20/2023 07:32:40 -------------------------------------------------------------------------------- Clinic Level of Care Assessment Details Patient Name: Date of Service: [place] MMA [name] 04/20/2023 10:30 A M Medical Record Number: [id number] Patient Account Number: [id number] Date of Birth/Sex: Treating RN: [birth year] (37 y.o. [name] Primary Care Guadalupe Kerekes: Sanda Linger Other Clinician: Referring San Lohmeyer: Treating Adonnis Salceda/Extender: Madelyn Flavors in  Treatment: 4 Clinic Level of Care Assessment Items TOOL 4 Quantity Score X- 1 0 Use when only an EandM is performed on FOLLOW-UP visit ASSESSMENTS - Nursing Assessment / Reassessment X- 1 10 Reassessment of Co-morbidities (includes updates in patient status) X- 1 5 Reassessment of Adherence to Treatment Plan ASSESSMENTS - Wound and Skin A ssessment / Reassessment []  - 0 Simple Wound Assessment / Reassessment - one wound X- 1 5 Complex Wound Assessment / Reassessment - multiple wounds X- 1 10 Dermatologic / Skin Assessment (not related to wound area) ASSESSMENTS - Focused Assessment X- 1 5 Circumferential Edema Measurements - multi extremities []  - 0 Nutritional Assessment / Counseling / Intervention [name], [name] ([id number]) [id number] Page 2 of 18 []  - 0 Lower Extremity Assessment (monofilament, tuning fork, pulses) []  - 0 Peripheral Arterial Disease Assessment (using hand held doppler) ASSESSMENTS - Ostomy and/or Continence Assessment and Care []  - 0 Incontinence Assessment and Management []  - 0 Ostomy Care Assessment and Management (repouching, etc.) PROCESS - Coordination of Care []  - 0 Simple Patient / Family Education for ongoing care X- 1 20 Complex (extensive) Patient / Family Education for ongoing care X- 1 10 Staff obtains Chiropractor, Records, T Results / Process Orders est []  - 0 Staff telephones HHA, Nursing Homes / Clarify orders / etc []  - 0 Routine Transfer to another Facility (non-emergent condition) []  - 0 Routine Hospital Admission (non-emergent condition) []  - 0 New Admissions / Manufacturing engineer / Ordering NPWT Apligraf, etc. , []  - 0 Emergency Hospital Admission (emergent condition) []  - 0 Simple Discharge Coordination X- 1 15 Complex (extensive) Discharge Coordination PROCESS - Special Needs []  - 0 Pediatric / Minor Patient Management []  - 0 Isolation Patient Management []  - 0 Hearing / Language /  Visual special needs []  - 0 Assessment of Community assistance (transportation, D/C planning, etc.) []  - 0 Additional assistance / Altered mentation []  - 0 Support Surface(s) Assessment (bed, cushion, seat, etc.) INTERVENTIONS -  Use triamcinolone ****liberally*** Sween Lotion (Moisturizing lotion) Discharge Instruction: Apply moisturizing lotion as directed Topical Triamcinolone Discharge Instruction: Apply Triamcinolone as directed liberallly Primary Dressing Hydrofera Blue Ready Transfer Foam, 2.5x2.5 (in/in) Discharge Instruction:  Apply directly to wound bed as directed Santyl Ointment Discharge Instruction: Apply nickel thick amount to wound bed as instructed Secondary Dressing ABD Pad, 8x10 Discharge Instruction: Apply over primary dressing as directed. Woven Gauze Sponge, Non-Sterile 4x4 in Discharge Instruction: Apply over primary dressing as directed. Secured With Compression Wrap Kerlix Roll 4.5x3.1 (in/yd) Discharge Instruction: Apply Kerlix and Coban compression as directed. Coban Self-Adherent Wrap 4x5 (in/yd) Discharge Instruction: Apply over Kerlix as directed. ****UNNA BOOT FIRST LAYER TO UPPER PORTION OF LOWER LEGS AND TO DORSAL FEET TO SECURE WRAPS IN PLACE. Compression Stockings Add-Ons Electronic Signature(s) Signed: 04/21/2023 4:07:14 PM By: Brenton Grills Entered By: Brenton Grills on 04/20/2023 07:48:37 -------------------------------------------------------------------------------- Wound Assessment Details Patient Name: Date of Service: [name], [name]. 04/20/2023 10:30 A M Medical Record Number: [id number] Patient Account Number: [id number] Date of Birth/Sex: Treating RN: 03/19/1986 (37 y.o. [name] Primary Care Benjie Ricketson: Sanda Linger Other Clinician: Referring Shakiyla Kook: Treating Angelyse Heslin/Extender: Madelyn Flavors in Treatment: 4 Wound Status Wound Number: 3 Primary Etiology: Lymphedema Wound Location: Left, Proximal, Dorsal Foot Wound Status: Open Wounding Event: Gradually Appeared Comorbid History: Anemia, Sleep Apnea, Vasculitis, Neuropathy Date Acquired: 03/09/2023 Weeks Of Treatment: 4 Clustered Wound: Yes Photos [name], [name] ([id number]) [id number] Page 14 of 18 Wound Measurements Length: (cm) 7.5 Width: (cm) 6.5 Depth: (cm) 0.2 Area: (cm) 38.288 Volume: (cm) 7.658 % Reduction in Area: -134.4% % Reduction in Volume: -134.4% Tunneling: No Undermining: No Wound Description Classification:  Unclassifiable Exudate Amount: Medium Exudate Type: Serosanguineous Exudate Color: red, brown Periwound Skin Texture Texture Color No Abnormalities Noted: No No Abnormalities Noted: No Moisture No Abnormalities Noted: No Treatment Notes Wound #3 (Foot) Wound Laterality: Dorsal, Left, Proximal Cleanser Soap and Water Discharge Instruction: May shower and wash wound with dial antibacterial soap and water prior to dressing change. Vashe 5.8 (oz) Discharge Instruction: Cleanse the wound with Vashe prior to applying a clean dressing using gauze sponges, not tissue or cotton balls. Peri-Wound Care Triamcinolone 15 (g) Discharge Instruction: Use triamcinolone ****liberally*** Sween Lotion (Moisturizing lotion) Discharge Instruction: Apply moisturizing lotion as directed Topical Triamcinolone Discharge Instruction: Apply Triamcinolone as directed liberallly Primary Dressing Hydrofera Blue Ready Transfer Foam, 2.5x2.5 (in/in) Discharge Instruction: Apply directly to wound bed as directed Santyl Ointment Discharge Instruction: Apply nickel thick amount to wound bed as instructed Secondary Dressing ABD Pad, 8x10 Discharge Instruction: Apply over primary dressing as directed. Woven Gauze Sponge, Non-Sterile 4x4 in Discharge Instruction: Apply over primary dressing as directed. Secured With Compression Wrap Kerlix Roll 4.5x3.1 (in/yd) Discharge Instruction: Apply Kerlix and Coban compression as directed. Coban Self-Adherent Wrap 4x5 (in/yd) Discharge Instruction: Apply over Kerlix as directed. ****UNNA BOOT FIRST LAYER TO UPPER PORTION OF LOWER LEGS AND TO DORSAL FEET TO SECURE WRAPS IN PLACE. [name], [name] ([id number]) [id number] Page 15 of 18 Compression Stockings Add-Ons Electronic Signature(s) Signed: 04/21/2023 4:07:14 PM By: Brenton Grills Entered By: Brenton Grills on 04/20/2023  07:49:07 -------------------------------------------------------------------------------- Wound Assessment Details Patient Name: Date of Service: [name], [name]. 04/20/2023 10:30 A M Medical Record Number: [id number] Patient Account Number: [id number] Date of Birth/Sex: Treating RN: 11-Jan-1986 (37 y.o. [name] Primary Care Cara Aguino: Sanda Linger Other Clinician: Referring Ama Mcmaster: Treating Tyria Springer/Extender: Madelyn Flavors in Treatment: 4 Wound Status Wound Number: 4  [name], [name] ([id number]) [id number] Page 1 of 18 Visit Report for 04/20/2023 Arrival Information Details Patient Name: Date of Service: GA MMA [name] 04/20/2023 10:30 A M Medical Record Number: [id number] Patient Account Number: [id number] Date of Birth/Sex: Treating RN: [birth year] (37 y.o. [name] Primary Care Arial Galligan: Sanda Linger Other Clinician: Referring Sharlyn Odonnel: Treating Onix Jumper/Extender: Madelyn Flavors in Treatment: 4 Visit Information History Since Last Visit All ordered tests and consults were completed: Yes Patient Arrived: Ambulatory Added or deleted any medications: No Arrival Time: 10:32 Any new allergies or adverse reactions: No Accompanied By: mother Had a fall or experienced change in No Transfer Assistance: None activities of daily living that may affect Patient Identification Verified: Yes risk of falls: Secondary Verification Process Completed: Yes Signs or symptoms of abuse/neglect since last visito No Patient Requires Transmission-Based Precautions: No Hospitalized since last visit: No Patient Has Alerts: No Implantable device outside of the clinic excluding No cellular tissue based products placed in the center since last visit: Has Dressing in Place as Prescribed: Yes Pain Present Now: No Electronic Signature(s) Signed: 04/21/2023 4:07:14 PM By: Brenton Grills Entered By: Brenton Grills on 04/20/2023 07:32:40 -------------------------------------------------------------------------------- Clinic Level of Care Assessment Details Patient Name: Date of Service: GA MMA [name] 04/20/2023 10:30 A M Medical Record Number: [id number] Patient Account Number: [id number] Date of Birth/Sex: Treating RN: [birth year] (37 y.o. [name] Primary Care Guadalupe Kerekes: Sanda Linger Other Clinician: Referring San Lohmeyer: Treating Adonnis Salceda/Extender: Madelyn Flavors in  Treatment: 4 Clinic Level of Care Assessment Items TOOL 4 Quantity Score X- 1 0 Use when only an EandM is performed on FOLLOW-UP visit ASSESSMENTS - Nursing Assessment / Reassessment X- 1 10 Reassessment of Co-morbidities (includes updates in patient status) X- 1 5 Reassessment of Adherence to Treatment Plan ASSESSMENTS - Wound and Skin A ssessment / Reassessment []  - 0 Simple Wound Assessment / Reassessment - one wound X- 1 5 Complex Wound Assessment / Reassessment - multiple wounds X- 1 10 Dermatologic / Skin Assessment (not related to wound area) ASSESSMENTS - Focused Assessment X- 1 5 Circumferential Edema Measurements - multi extremities []  - 0 Nutritional Assessment / Counseling / Intervention [name], [name] ([id number]) [id number] Page 2 of 18 []  - 0 Lower Extremity Assessment (monofilament, tuning fork, pulses) []  - 0 Peripheral Arterial Disease Assessment (using hand held doppler) ASSESSMENTS - Ostomy and/or Continence Assessment and Care []  - 0 Incontinence Assessment and Management []  - 0 Ostomy Care Assessment and Management (repouching, etc.) PROCESS - Coordination of Care []  - 0 Simple Patient / Family Education for ongoing care X- 1 20 Complex (extensive) Patient / Family Education for ongoing care X- 1 10 Staff obtains Chiropractor, Records, T Results / Process Orders est []  - 0 Staff telephones HHA, Nursing Homes / Clarify orders / etc []  - 0 Routine Transfer to another Facility (non-emergent condition) []  - 0 Routine Hospital Admission (non-emergent condition) []  - 0 New Admissions / Manufacturing engineer / Ordering NPWT Apligraf, etc. , []  - 0 Emergency Hospital Admission (emergent condition) []  - 0 Simple Discharge Coordination X- 1 15 Complex (extensive) Discharge Coordination PROCESS - Special Needs []  - 0 Pediatric / Minor Patient Management []  - 0 Isolation Patient Management []  - 0 Hearing / Language /  Visual special needs []  - 0 Assessment of Community assistance (transportation, D/C planning, etc.) []  - 0 Additional assistance / Altered mentation []  - 0 Support Surface(s) Assessment (bed, cushion, seat, etc.) INTERVENTIONS -  Use triamcinolone ****liberally*** Sween Lotion (Moisturizing lotion) Discharge Instruction: Apply moisturizing lotion as directed Topical Triamcinolone Discharge Instruction: Apply Triamcinolone as directed liberallly Primary Dressing Hydrofera Blue Ready Transfer Foam, 2.5x2.5 (in/in) Discharge Instruction:  Apply directly to wound bed as directed Santyl Ointment Discharge Instruction: Apply nickel thick amount to wound bed as instructed Secondary Dressing ABD Pad, 8x10 Discharge Instruction: Apply over primary dressing as directed. Woven Gauze Sponge, Non-Sterile 4x4 in Discharge Instruction: Apply over primary dressing as directed. Secured With Compression Wrap Kerlix Roll 4.5x3.1 (in/yd) Discharge Instruction: Apply Kerlix and Coban compression as directed. Coban Self-Adherent Wrap 4x5 (in/yd) Discharge Instruction: Apply over Kerlix as directed. ****UNNA BOOT FIRST LAYER TO UPPER PORTION OF LOWER LEGS AND TO DORSAL FEET TO SECURE WRAPS IN PLACE. Compression Stockings Add-Ons Electronic Signature(s) Signed: 04/21/2023 4:07:14 PM By: Brenton Grills Entered By: Brenton Grills on 04/20/2023 07:48:37 -------------------------------------------------------------------------------- Wound Assessment Details Patient Name: Date of Service: [name], [name]. 04/20/2023 10:30 A M Medical Record Number: [id number] Patient Account Number: [id number] Date of Birth/Sex: Treating RN: 03/19/1986 (37 y.o. [name] Primary Care Benjie Ricketson: Sanda Linger Other Clinician: Referring Shakiyla Kook: Treating Angelyse Heslin/Extender: Madelyn Flavors in Treatment: 4 Wound Status Wound Number: 3 Primary Etiology: Lymphedema Wound Location: Left, Proximal, Dorsal Foot Wound Status: Open Wounding Event: Gradually Appeared Comorbid History: Anemia, Sleep Apnea, Vasculitis, Neuropathy Date Acquired: 03/09/2023 Weeks Of Treatment: 4 Clustered Wound: Yes Photos [name], [name] ([id number]) [id number] Page 14 of 18 Wound Measurements Length: (cm) 7.5 Width: (cm) 6.5 Depth: (cm) 0.2 Area: (cm) 38.288 Volume: (cm) 7.658 % Reduction in Area: -134.4% % Reduction in Volume: -134.4% Tunneling: No Undermining: No Wound Description Classification:  Unclassifiable Exudate Amount: Medium Exudate Type: Serosanguineous Exudate Color: red, brown Periwound Skin Texture Texture Color No Abnormalities Noted: No No Abnormalities Noted: No Moisture No Abnormalities Noted: No Treatment Notes Wound #3 (Foot) Wound Laterality: Dorsal, Left, Proximal Cleanser Soap and Water Discharge Instruction: May shower and wash wound with dial antibacterial soap and water prior to dressing change. Vashe 5.8 (oz) Discharge Instruction: Cleanse the wound with Vashe prior to applying a clean dressing using gauze sponges, not tissue or cotton balls. Peri-Wound Care Triamcinolone 15 (g) Discharge Instruction: Use triamcinolone ****liberally*** Sween Lotion (Moisturizing lotion) Discharge Instruction: Apply moisturizing lotion as directed Topical Triamcinolone Discharge Instruction: Apply Triamcinolone as directed liberallly Primary Dressing Hydrofera Blue Ready Transfer Foam, 2.5x2.5 (in/in) Discharge Instruction: Apply directly to wound bed as directed Santyl Ointment Discharge Instruction: Apply nickel thick amount to wound bed as instructed Secondary Dressing ABD Pad, 8x10 Discharge Instruction: Apply over primary dressing as directed. Woven Gauze Sponge, Non-Sterile 4x4 in Discharge Instruction: Apply over primary dressing as directed. Secured With Compression Wrap Kerlix Roll 4.5x3.1 (in/yd) Discharge Instruction: Apply Kerlix and Coban compression as directed. Coban Self-Adherent Wrap 4x5 (in/yd) Discharge Instruction: Apply over Kerlix as directed. ****UNNA BOOT FIRST LAYER TO UPPER PORTION OF LOWER LEGS AND TO DORSAL FEET TO SECURE WRAPS IN PLACE. [name], [name] ([id number]) [id number] Page 15 of 18 Compression Stockings Add-Ons Electronic Signature(s) Signed: 04/21/2023 4:07:14 PM By: Brenton Grills Entered By: Brenton Grills on 04/20/2023  07:49:07 -------------------------------------------------------------------------------- Wound Assessment Details Patient Name: Date of Service: [name], [name]. 04/20/2023 10:30 A M Medical Record Number: [id number] Patient Account Number: [id number] Date of Birth/Sex: Treating RN: 11-Jan-1986 (37 y.o. [name] Primary Care Cara Aguino: Sanda Linger Other Clinician: Referring Ama Mcmaster: Treating Tyria Springer/Extender: Madelyn Flavors in Treatment: 4 Wound Status Wound Number: 4  [name], [name] ([id number]) [id number] Page 1 of 18 Visit Report for 04/20/2023 Arrival Information Details Patient Name: Date of Service: GA MMA [name] 04/20/2023 10:30 A M Medical Record Number: [id number] Patient Account Number: [id number] Date of Birth/Sex: Treating RN: [birth year] (37 y.o. [name] Primary Care Arial Galligan: Sanda Linger Other Clinician: Referring Sharlyn Odonnel: Treating Onix Jumper/Extender: Madelyn Flavors in Treatment: 4 Visit Information History Since Last Visit All ordered tests and consults were completed: Yes Patient Arrived: Ambulatory Added or deleted any medications: No Arrival Time: 10:32 Any new allergies or adverse reactions: No Accompanied By: mother Had a fall or experienced change in No Transfer Assistance: None activities of daily living that may affect Patient Identification Verified: Yes risk of falls: Secondary Verification Process Completed: Yes Signs or symptoms of abuse/neglect since last visito No Patient Requires Transmission-Based Precautions: No Hospitalized since last visit: No Patient Has Alerts: No Implantable device outside of the clinic excluding No cellular tissue based products placed in the center since last visit: Has Dressing in Place as Prescribed: Yes Pain Present Now: No Electronic Signature(s) Signed: 04/21/2023 4:07:14 PM By: Brenton Grills Entered By: Brenton Grills on 04/20/2023 07:32:40 -------------------------------------------------------------------------------- Clinic Level of Care Assessment Details Patient Name: Date of Service: GA MMA [name] 04/20/2023 10:30 A M Medical Record Number: [id number] Patient Account Number: [id number] Date of Birth/Sex: Treating RN: [birth year] (37 y.o. [name] Primary Care Guadalupe Kerekes: Sanda Linger Other Clinician: Referring San Lohmeyer: Treating Adonnis Salceda/Extender: Madelyn Flavors in  Treatment: 4 Clinic Level of Care Assessment Items TOOL 4 Quantity Score X- 1 0 Use when only an EandM is performed on FOLLOW-UP visit ASSESSMENTS - Nursing Assessment / Reassessment X- 1 10 Reassessment of Co-morbidities (includes updates in patient status) X- 1 5 Reassessment of Adherence to Treatment Plan ASSESSMENTS - Wound and Skin A ssessment / Reassessment []  - 0 Simple Wound Assessment / Reassessment - one wound X- 1 5 Complex Wound Assessment / Reassessment - multiple wounds X- 1 10 Dermatologic / Skin Assessment (not related to wound area) ASSESSMENTS - Focused Assessment X- 1 5 Circumferential Edema Measurements - multi extremities []  - 0 Nutritional Assessment / Counseling / Intervention [name], [name] ([id number]) [id number] Page 2 of 18 []  - 0 Lower Extremity Assessment (monofilament, tuning fork, pulses) []  - 0 Peripheral Arterial Disease Assessment (using hand held doppler) ASSESSMENTS - Ostomy and/or Continence Assessment and Care []  - 0 Incontinence Assessment and Management []  - 0 Ostomy Care Assessment and Management (repouching, etc.) PROCESS - Coordination of Care []  - 0 Simple Patient / Family Education for ongoing care X- 1 20 Complex (extensive) Patient / Family Education for ongoing care X- 1 10 Staff obtains Chiropractor, Records, T Results / Process Orders est []  - 0 Staff telephones HHA, Nursing Homes / Clarify orders / etc []  - 0 Routine Transfer to another Facility (non-emergent condition) []  - 0 Routine Hospital Admission (non-emergent condition) []  - 0 New Admissions / Manufacturing engineer / Ordering NPWT Apligraf, etc. , []  - 0 Emergency Hospital Admission (emergent condition) []  - 0 Simple Discharge Coordination X- 1 15 Complex (extensive) Discharge Coordination PROCESS - Special Needs []  - 0 Pediatric / Minor Patient Management []  - 0 Isolation Patient Management []  - 0 Hearing / Language /  Visual special needs []  - 0 Assessment of Community assistance (transportation, D/C planning, etc.) []  - 0 Additional assistance / Altered mentation []  - 0 Support Surface(s) Assessment (bed, cushion, seat, etc.) INTERVENTIONS -  nickel thick amount to wound bed as instructed Secondary Dressing ABD Pad, 8x10 Discharge Instruction: Apply over primary dressing as directed. Woven Gauze Sponge, Non-Sterile 4x4 in Discharge Instruction: Apply over primary dressing as directed. Secured With Compression Wrap Kerlix Roll 4.5x3.1 (in/yd) Discharge Instruction: Apply Kerlix and Coban compression as directed. Coban Self-Adherent Wrap 4x5 (in/yd) Discharge Instruction: Apply over Kerlix as directed. ****UNNA BOOT FIRST LAYER TO UPPER PORTION OF LOWER LEGS AND TO DORSAL FEET TO SECURE WRAPS IN PLACE. [name], [name] ([id number]) [id number] Page 18 of  18 Compression Stockings Add-Ons Electronic Signature(s) Signed: 04/21/2023 4:07:14 PM By: Brenton Grills Entered By: Brenton Grills on 04/20/2023 07:50:23 -------------------------------------------------------------------------------- Vitals Details Patient Name: Date of Service: [name], [name]. 04/20/2023 10:30 A M Medical Record Number: [id number] Patient Account Number: [id number] Date of Birth/Sex: Treating RN: 1986-04-04 (37 y.o. [name] Primary Care Joleah Kosak: Sanda Linger Other Clinician: Referring Scotty Pinder: Treating Khrystina Bonnes/Extender: Madelyn Flavors in Treatment: 4 Vital Signs Time Taken: 10:32 Temperature (F): 98.5 Height (in): 69 Pulse (bpm): 121 Weight (lbs): 272 Respiratory Rate (breaths/min): 18 Body Mass Index (BMI): 40.2 Blood Pressure (mmHg): 147/98 Reference Range: 80 - 120 mg / dl Electronic Signature(s) Signed: 04/21/2023 4:07:14 PM By: Brenton Grills Entered By: Brenton Grills on 04/20/2023 07:34:15  Date of Birth/Sex: Treating RN: [birth year] (37 y.o. F) Primary Care Fionn Stracke: Sanda Linger Other Clinician: Referring Teyanna Thielman: Treating Mayerli Kirst/Extender: Madelyn Flavors in Treatment: 4 Vital Signs Height(in): 69 Pulse(bpm): 121 Weight(lbs): 272 Blood Pressure(mmHg): 147/98 Body Mass Index(BMI): 40.2 Temperature(F): 98.5 Respiratory Rate(breaths/min): 18 [1:Photos:] Left, Medial Foot Left, Distal Foot Left, Proximal, Dorsal Foot Wound Location: Gradually Appeared Gradually Appeared Gradually Appeared Wounding Event: Lymphedema Lymphedema Lymphedema Primary Etiology: Anemia, Sleep Apnea, Vasculitis, Anemia, Sleep Apnea, Vasculitis, Anemia, Sleep Apnea, Vasculitis, Comorbid History: Neuropathy Neuropathy Neuropathy 03/09/2023 03/09/2023 03/09/2023 Date Acquired: 4 4 4  Weeks of Treatment: Open Open Open Wound Status: No No No Wound Recurrence: Yes Yes Yes Clustered Wound: 5x0.8x0.1 2.5x3.4x0.1 7.5x6.5x0.2 Measurements L x W x D (cm) 3.142 6.676 38.288 A (cm) : rea 0.314 0.668 7.658 Volume (cm) : 95.60% 71.70% -134.40% % Reduction in Area: 95.60% 71.60% -134.40% % Reduction in Volume: Unclassifiable Full Thickness Without Exposed Unclassifiable Classification: Support Structures Medium Medium  Medium Exudate AmountZAHARAH, [name] ([id number]) [id number] Page 5 of 18 Serosanguineous Serosanguineous Serosanguineous Exudate Type: red, brown red, brown red, brown Exudate Color: Wound Number: 4 5 N/A Photos: N/A Right, Dorsal Foot Right, Lateral Calcaneus N/A Wound Location: Gradually Appeared Gradually Appeared N/A Wounding Event: Lymphedema Lymphedema N/A Primary Etiology: Anemia, Sleep Apnea, Vasculitis, Anemia, Sleep Apnea, Vasculitis, N/A Comorbid History: Neuropathy Neuropathy 03/09/2023 03/09/2023 N/A Date Acquired: 4 4 N/A Weeks of Treatment: Open Open N/A Wound Status: No No N/A Wound Recurrence: Yes No N/A Clustered Wound: 4.5x8.5x0.3 0.7x0.7x0.1 N/A Measurements L x W x D (cm) 30.041 0.385 N/A A (cm) : rea 9.012 0.038 N/A Volume (cm) : 20.30% 93.50% N/A % Reduction in Area: -139.00% 93.50% N/A % Reduction in Volume: Full Thickness Without Exposed Full Thickness Without Exposed N/A Classification: Support Structures Support Structures Medium Medium N/A Exudate Amount: Serosanguineous Serosanguineous N/A Exudate Type: red, brown red, brown N/A Exudate Color: Treatment Notes Wound #1 (Foot) Wound Laterality: Left, Medial Cleanser Soap and Water Discharge Instruction: May shower and wash wound with dial antibacterial soap and water prior to dressing change. Vashe 5.8 (oz) Discharge Instruction: Cleanse the wound with Vashe prior to applying a clean dressing using gauze sponges, not tissue or cotton balls. Peri-Wound Care Triamcinolone 15 (g) Discharge Instruction: Use triamcinolone ****liberally*** Sween Lotion (Moisturizing lotion) Discharge Instruction: Apply moisturizing lotion as directed Topical Triamcinolone Discharge Instruction: Apply Triamcinolone as directed liberallly Primary Dressing Hydrofera Blue Ready Transfer Foam, 2.5x2.5 (in/in) Discharge Instruction: Apply directly to wound bed as  directed Santyl Ointment Discharge Instruction: Apply nickel thick amount to wound bed as instructed Secondary Dressing ABD Pad, 8x10 Discharge Instruction: Apply over primary dressing as directed. Woven Gauze Sponge, Non-Sterile 4x4 in Discharge Instruction: Apply over primary dressing as directed. Secured With Compression Wrap Kerlix Roll 4.5x3.1 (in/yd) Discharge Instruction: Apply Kerlix and Coban compression as directed. Coban Self-Adherent Wrap 4x5 (in/yd) Discharge Instruction: Apply over Kerlix as directed. ****UNNA BOOT FIRST LAYER TO UPPER PORTION OF LOWER LEGS AND TO DORSAL FEET TO SECURE WRAPS IN PLACE. [name], [name] ([id number]) [id number] Page 6 of 18 Compression Stockings Add-Ons Wound #2 (Foot) Wound Laterality: Left, Distal Cleanser Soap and Water Discharge Instruction: May shower and wash wound with dial antibacterial soap and water prior to dressing change. Vashe 5.8 (oz) Discharge Instruction: Cleanse the wound with Vashe prior to applying a clean dressing using gauze sponges, not tissue or cotton balls. Peri-Wound Care Triamcinolone 15 (g) Discharge Instruction: Use triamcinolone ****liberally*** Sween Lotion (Moisturizing lotion) Discharge Instruction: Apply moisturizing lotion  [name]. 04/20/2023 10:30 A M Medical Record Number: [id number] Patient Account Number: [id number] Date of Birth/Sex: Treating RN: 1985/07/31 (37 y.o. [name] Primary Care Christyan Reger: Sanda Linger Other Clinician: Referring Taelor Waymire: Treating Jhon Mallozzi/Extender: Madelyn Flavors in Treatment: 4 Wound Status Wound Number: 1 Primary Etiology: Lymphedema Wound Location: Left, Medial Foot Wound Status: Open Wounding Event: Gradually Appeared Comorbid History: Anemia, Sleep Apnea, Vasculitis, Neuropathy Date Acquired: 03/09/2023 Weeks Of Treatment: 4 Clustered Wound: Yes Photos [name], [name] ([id number]) [id number] Page 11 of 18 Wound Measurements Length: (cm) 5 Width: (cm) 0.8 Depth: (cm) 0.1 Area: (cm) 3.142 Volume: (cm) 0.314 % Reduction in Area: 95.6% % Reduction in Volume: 95.6% Wound Description Classification: Unclassifiable Exudate Amount: Medium Exudate Type:  Serosanguineous Exudate Color: red, brown Periwound Skin Texture Texture Color No Abnormalities Noted: No No Abnormalities Noted: No Moisture No Abnormalities Noted: No Treatment Notes Wound #1 (Foot) Wound Laterality: Left, Medial Cleanser Soap and Water Discharge Instruction: May shower and wash wound with dial antibacterial soap and water prior to dressing change. Vashe 5.8 (oz) Discharge Instruction: Cleanse the wound with Vashe prior to applying a clean dressing using gauze sponges, not tissue or cotton balls. Peri-Wound Care Triamcinolone 15 (g) Discharge Instruction: Use triamcinolone ****liberally*** Sween Lotion (Moisturizing lotion) Discharge Instruction: Apply moisturizing lotion as directed Topical Triamcinolone Discharge Instruction: Apply Triamcinolone as directed liberallly Primary Dressing Hydrofera Blue Ready Transfer Foam, 2.5x2.5 (in/in) Discharge Instruction: Apply directly to wound bed as directed Santyl Ointment Discharge Instruction: Apply nickel thick amount to wound bed as instructed Secondary Dressing ABD Pad, 8x10 Discharge Instruction: Apply over primary dressing as directed. Woven Gauze Sponge, Non-Sterile 4x4 in Discharge Instruction: Apply over primary dressing as directed. Secured With Compression Wrap Kerlix Roll 4.5x3.1 (in/yd) Discharge Instruction: Apply Kerlix and Coban compression as directed. Coban Self-Adherent Wrap 4x5 (in/yd) Discharge Instruction: Apply over Kerlix as directed. ****UNNA BOOT FIRST LAYER TO UPPER PORTION OF LOWER LEGS AND TO DORSAL FEET TO SECURE WRAPS IN PLACE. [name], CLECKLEY [name] ([id number]) [id number] Page 12 of 18 Compression Stockings Add-Ons Electronic Signature(s) Signed: 04/21/2023 4:07:14 PM By: Brenton Grills Entered By: Brenton Grills on 04/20/2023 07:48:08 -------------------------------------------------------------------------------- Wound Assessment Details Patient Name:  Date of Service: [name], [name]. 04/20/2023 10:30 A M Medical Record Number: [id number] Patient Account Number: [id number] Date of Birth/Sex: Treating RN: 14-Apr-1986 (37 y.o. [name] Primary Care Larri Brewton: Sanda Linger Other Clinician: Referring Karsten Howry: Treating Lonney Revak/Extender: Madelyn Flavors in Treatment: 4 Wound Status Wound Number: 2 Primary Etiology: Lymphedema Wound Location: Left, Distal Foot Wound Status: Open Wounding Event: Gradually Appeared Comorbid History: Anemia, Sleep Apnea, Vasculitis, Neuropathy Date Acquired: 03/09/2023 Weeks Of Treatment: 4 Clustered Wound: Yes Photos Wound Measurements Length: (cm) 2.5 Width: (cm) 3.4 Depth: (cm) 0.1 Area: (cm) 6.676 Volume: (cm) 0.668 % Reduction in Area: 71.7% % Reduction in Volume: 71.6% Wound Description Classification: Full Thickness Without Exposed Support Exudate Amount: Medium Exudate Type: Serosanguineous Exudate Color: red, brown Structures Periwound Skin Texture Texture Color No Abnormalities Noted: No No Abnormalities Noted: No Moisture No Abnormalities Noted: No Treatment Notes Wound #2 (Foot) Wound Laterality: Left, Distal Cleanser Soap and Water W[place]ONIA, FONTANELLA [name] ([id number]) [id number] Page 13 of 18 Discharge Instruction: May shower and wash wound with dial antibacterial soap and water prior to dressing change. Vashe 5.8 (oz) Discharge Instruction: Cleanse the wound with Vashe prior to applying a clean dressing using gauze sponges, not tissue or cotton balls. Peri-Wound Care Triamcinolone 15 (g) Discharge Instruction:

## 2023-04-21 NOTE — Progress Notes (Signed)
Calcaneus Wound Laterality: Right, Lateral Cleanser: Soap and Water 1 x Per Week/30 Days Discharge Instructions: May shower and wash wound with dial  antibacterial soap and water prior to dressing change. Cleanser: Vashe 5.8 (oz) 1 x Per Week/30 Days Discharge Instructions: Cleanse the wound with Vashe prior to applying a clean dressing using gauze sponges, not tissue or cotton balls. Peri-Wound Care: Triamcinolone 15 (g) 1 x Per Week/30 Days Discharge Instructions: Use triamcinolone ****liberally*** Peri-Wound Care: Sween Lotion (Moisturizing lotion) 1 x Per Week/30 Days Discharge Instructions: Apply moisturizing lotion as directed Topical: Triamcinolone 1 x Per Week/30 Days Discharge Instructions: Apply Triamcinolone as directed liberallly Prim Dressing: Hydrofera Blue Ready Transfer Foam, 2.5x2.5 (in/in) 1 x Per Week/30 Days ary Discharge Instructions: Apply directly to wound bed as directed Prim Dressing: Santyl Ointment 1 x Per Week/30 Days ary Discharge Instructions: Apply nickel thick amount to wound bed as instructed EMMERY, SEILER R ([id number]) [id number] Page 5 of 9 Secondary Dressing: ABD Pad, 8x10 1 x Per Week/30 Days Discharge Instructions: Apply over primary dressing as directed. Secondary Dressing: Woven Gauze Sponge, Non-Sterile 4x4 in 1 x Per Week/30 Days Discharge Instructions: Apply over primary dressing as directed. Compression Wrap: Kerlix Roll 4.5x3.1 (in/yd) 1 x Per Week/30 Days Discharge Instructions: Apply Kerlix and Coban compression as directed. Compression Wrap: Coban Self-Adherent Wrap 4x5 (in/yd) 1 x Per Week/30 Days Discharge Instructions: Apply over Kerlix as directed. ****UNNA BOOT FIRST LAYER TO UPPER PORTION OF LOWER LEGS AND TO DORSAL FEET TO SECURE WRAPS IN PLACE. Electronic Signature(s) Signed: 04/20/2023 4:40:46 PM By: Baltazar Najjar MD Signed: 04/21/2023 4:07:14 PM By: Brenton Grills Entered By: Brenton Grills on 04/20/2023 11:01:11 -------------------------------------------------------------------------------- Problem List Details Patient Name: Date of  Service: [name], [name] 04/20/2023 10:30 A M Medical Record Number: [id number] Patient Account Number: [id number] Date of Birth/Sex: Treating RN: [birth year] (37 y.o. 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 04/20/2023 10:30 A M Medical Record Number: [id number] Patient Account Number: [id number] Date of Birth/Sex: Treating RN: 1985-12-01 (37 y.o. F) Primary Care Provider: Sanda Linger Other Clinician: Referring Provider: Treating Provider/Extender: Madelyn Flavors in Treatment: 4 Subjective History of Present Illness (HPI) ADMISSION 03/23/2023 This is a 37 year old woman who is Story is somewhat difficult to follow however she apparently has developed bilateral lower extremity swelling involving her feet and ankles roughly 3 weeks ago. This but this became associated with small painful areas on predominantly her left greater than right foot with exfoliation of the skin. She was admitted to hospital from 03/16/2023 through 03/19/2023. X-ray of the feet was negative however she was  noted to have a sedimentation rate greater than 90. Although she was empirically given antibiotics at the start of the admission. She was seen by infectious disease who did not feel this was infectious. It was felt that she  Self-Adherent Wrap 4x5 (in/yd) 1 x Per Week/30 Days Discharge Instructions: Apply over Kerlix as directed. ****UNNA BOOT FIRST LAYER TO UPPER PORTION OF LOWER LEGS AND TO DORSAL FEET TO SECURE WRAPS IN PLACE. WOUND #5: - Calcaneus Wound Laterality: Right, Lateral Cleanser: Soap and Water 1 x Per Week/30 Days Discharge  Instructions: May shower and wash wound with dial antibacterial soap and water prior to dressing change. Cleanser: Vashe 5.8 (oz) 1 x Per Week/30 Days Discharge Instructions: Cleanse the wound with Vashe prior to applying a clean dressing using gauze sponges, not tissue or cotton balls. Peri-Wound Care: Triamcinolone 15 (g) 1 x Per Week/30 Days Discharge Instructions: Use triamcinolone ****liberally*** Peri-Wound Care: Sween Lotion (Moisturizing lotion) 1 x Per Week/30 Days Discharge Instructions: Apply moisturizing lotion as directed Topical: Triamcinolone 1 x Per Week/30 Days Discharge Instructions: Apply Triamcinolone as directed liberallly Prim Dressing: Hydrofera Blue Ready Transfer Foam, 2.5x2.5 (in/in) 1 x Per Week/30 Days ary Discharge Instructions: Apply directly to wound bed as directed Prim Dressing: Santyl Ointment 1 x Per Week/30 Days ary Discharge Instructions: Apply nickel thick amount to wound bed as instructed Secondary Dressing: ABD Pad, 8x10 1 x Per Week/30 Days Discharge Instructions: Apply over primary dressing as directed. Secondary Dressing: Woven Gauze Sponge, Non-Sterile 4x4 in 1 x Per Week/30 Days Discharge Instructions: Apply over primary dressing as directed. Com pression Wrap: Kerlix Roll 4.5x3.1 (in/yd) 1 x Per Week/30 Days Discharge Instructions: Apply Kerlix and Coban compression as directed. Com pression Wrap: Coban Self-Adherent Wrap 4x5 (in/yd) 1 x Per Week/30 Days Discharge Instructions: Apply over Kerlix as directed. ****UNNA BOOT FIRST LAYER TO UPPER PORTION OF LOWER LEGS AND TO DORSAL FEET TO SECURE WRAPS IN PLACE. 1. Multiple wounds on her dorsal feet and right lateral heel. Most of these look somewhat better some of actually closed. We still have some with a adherent surface eschar but they are loosening using Hydrofera Blue and Santyl 2. As we bring the prednisone down to off if she develops more additional areas these are going to need to be  biopsied. I will either talk to her dermatologist or consider referring her to dermatology at [facility] 3. Still using TCA, Hydrofera Blue and Santyl to the areas that are still eschared on her feet. Kerlix Coban compression with Unna boots to the upper portion of CAMRIE, STOCK R ([id number]) [phone].pdf Page 9 of 9 the lower leg wraps Electronic Signature(s) Signed: 04/20/2023 4:40:46 PM By: Baltazar Najjar MD Entered By: Baltazar Najjar on 04/20/2023 12:27:31 -------------------------------------------------------------------------------- SuperBill Details Patient Name: Date of Service: [name], [name] 04/20/2023 Medical Record Number: [id number] Patient Account Number: [id number] Date of Birth/Sex: Treating RN: 31-Dec-1985 (37 y.o. 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 Facility Procedures : CPT4 Code: [id number] Description: 99215 - WOUND CARE VISIT-LEV 5 EST PT Modifier: 25 Quantity: 1 Physician Procedures : CPT4 Code Description Modifier [id number] 99213 - WC PHYS LEVEL 3 - EST PT ICD-10 Diagnosis Description L97.518 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 severity L97.528 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 Cleanser: Vashe 5.8 (oz) 1 x Per Week/30 Days Discharge Instructions: Cleanse the wound with Vashe prior to applying a clean dressing using gauze sponges, not tissue or cotton balls. Peri-Wound Care: Triamcinolone 15 (g) 1 x Per Week/30 Days Discharge Instructions: Use triamcinolone ****liberally*** Peri-Wound Care: Sween Lotion (Moisturizing lotion) 1 x Per Week/30 Days Discharge Instructions: Apply moisturizing lotion as directed Topical: Triamcinolone 1 x Per Week/30 Days Discharge Instructions: Apply Triamcinolone as directed liberallly Prim Dressing: Hydrofera Blue Ready Transfer Foam, 2.5x2.5 (in/in) 1 x Per Week/30 Days ary Discharge Instructions: Apply directly to wound bed as directed Prim Dressing: Santyl Ointment 1 x Per Week/30 Days ary Discharge Instructions: Apply nickel thick amount to wound bed as instructed EMMERY, SEILER R ([id number]) [id number] Page 5 of 9 Secondary Dressing: ABD Pad, 8x10 1 x Per Week/30 Days Discharge Instructions: Apply over primary dressing as directed. Secondary Dressing: Woven Gauze Sponge, Non-Sterile 4x4 in 1 x Per Week/30 Days Discharge Instructions: Apply over primary dressing as directed. Compression Wrap: Kerlix Roll 4.5x3.1 (in/yd) 1 x Per Week/30 Days Discharge Instructions: Apply Kerlix and Coban compression as directed. Compression Wrap: Coban Self-Adherent Wrap 4x5 (in/yd) 1 x Per Week/30 Days Discharge Instructions: Apply over Kerlix as directed. ****UNNA BOOT FIRST LAYER TO UPPER PORTION OF LOWER LEGS AND TO DORSAL FEET TO SECURE WRAPS IN PLACE. Electronic Signature(s) Signed: 04/20/2023 4:40:46 PM By: Baltazar Najjar MD Signed: 04/21/2023 4:07:14 PM By: Brenton Grills Entered By: Brenton Grills on 04/20/2023 11:01:11 -------------------------------------------------------------------------------- Problem List Details Patient Name: Date of  Service: [name], [name] 04/20/2023 10:30 A M Medical Record Number: [id number] Patient Account Number: [id number] Date of Birth/Sex: Treating RN: [birth year] (37 y.o. 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 04/20/2023 10:30 A M Medical Record Number: [id number] Patient Account Number: [id number] Date of Birth/Sex: Treating RN: 1985-12-01 (37 y.o. F) Primary Care Provider: Sanda Linger Other Clinician: Referring Provider: Treating Provider/Extender: Madelyn Flavors in Treatment: 4 Subjective History of Present Illness (HPI) ADMISSION 03/23/2023 This is a 37 year old woman who is Story is somewhat difficult to follow however she apparently has developed bilateral lower extremity swelling involving her feet and ankles roughly 3 weeks ago. This but this became associated with small painful areas on predominantly her left greater than right foot with exfoliation of the skin. She was admitted to hospital from 03/16/2023 through 03/19/2023. X-ray of the feet was negative however she was  noted to have a sedimentation rate greater than 90. Although she was empirically given antibiotics at the start of the admission. She was seen by infectious disease who did not feel this was infectious. It was felt that she  EVIANA, SIBILIA R ([id number]) [id number] Page 1 of 9 Visit Report for 04/20/2023 HPI Details Patient Name: Date of Service: GA MMA [name] NDRA R. 04/20/2023 10:30 A M Medical Record Number: [id number] Patient Account Number: [id number] Date of Birth/Sex: Treating RN: 1985-11-09 (37 y.o. F) Primary Care Provider: Sanda Linger Other Clinician: Referring Provider: Treating Provider/Extender: Madelyn Flavors in Treatment: 4 History of Present Illness HPI Description: ADMISSION 03/23/2023 This is a 37 year old woman who is Story is somewhat difficult to follow however she apparently has developed bilateral lower extremity swelling involving her feet and ankles roughly 3 weeks ago. This but this became associated with small painful areas on predominantly her left greater than right foot with exfoliation of the skin. She was admitted to hospital from 03/16/2023 through 03/19/2023. X-ray of the feet was negative however she was noted to have a sedimentation rate greater than 90. Although she was empirically given antibiotics at the start of the admission. She was seen by infectious disease who did not feel this was infectious. It was felt that she required a skin biopsy for possible vasculitis. Dermatology and rheumatology consults were suggested. She was discharged from the hospital on a gradually tapering dose of prednisone. She had cryoglobulins, complement levels measured. She did not have an active urinalysis. She has been soaking her feet in ice water and cold towels to relieve the pain. She saw her dermatologist who is in [place] yesterday for consideration of a skin biopsy. Per the patient's description the dermatologist did not wish to do a skin biopsy stating that he did not want to create another wound area although I do not have his or her notes at this point and I do not know what diagnosis she was felt to have. She arrives in clinic today with  discomfort in her feet. Exfoliating skin on the dorsal surfaces of both feet punched-out areas with necrosis especially on the left dorsal foot left lateral ankle. There is also areas on the right heel. Past medical history includes acute hepatitis question involving alcohol or hepatitis A, bilateral leg edema, obesity, hypertension, history of psoriasis on Taltz, chronic pain, apparently some form of idiopathic peripheral neuropathy, vitamin B12 deficiency ABIs in our clinic were 1.03 on the right 1.25 on the left Addendum 03/25/2023. I received the records from Dr. Cheree Ditto at [facility] dermatology in [place]. Looking at her record she felt that the issue was secondary to lymphedema and stasis dermatitis on both legs. I am not sure whether she looked at the areas on her feet which was the real area of concern. She specifically stated that she did not think that this was related to her psoriasis. Compression stockings were suggested. No biopsy was done 9/24; as noted above I did receive records from Dr. Cheree Ditto at [facility] dermatology and [place]. She felt the skin changes on her feet were related to lymphedema and stasis dermatitis. The patient arrived back in clinic the skin on her dorsal feet looks a lot better and she is in a lot less pain. HOWEVER she does have multiple small punched- out areas covered in black eschar. I did not biopsy this when I saw her the first time as she was going to dermatology the next day in retrospect that may not have been the right decision. 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 9/30; patient arrives in clinic with no complaints  Calcaneus Wound Laterality: Right, Lateral Cleanser: Soap and Water 1 x Per Week/30 Days Discharge Instructions: May shower and wash wound with dial  antibacterial soap and water prior to dressing change. Cleanser: Vashe 5.8 (oz) 1 x Per Week/30 Days Discharge Instructions: Cleanse the wound with Vashe prior to applying a clean dressing using gauze sponges, not tissue or cotton balls. Peri-Wound Care: Triamcinolone 15 (g) 1 x Per Week/30 Days Discharge Instructions: Use triamcinolone ****liberally*** Peri-Wound Care: Sween Lotion (Moisturizing lotion) 1 x Per Week/30 Days Discharge Instructions: Apply moisturizing lotion as directed Topical: Triamcinolone 1 x Per Week/30 Days Discharge Instructions: Apply Triamcinolone as directed liberallly Prim Dressing: Hydrofera Blue Ready Transfer Foam, 2.5x2.5 (in/in) 1 x Per Week/30 Days ary Discharge Instructions: Apply directly to wound bed as directed Prim Dressing: Santyl Ointment 1 x Per Week/30 Days ary Discharge Instructions: Apply nickel thick amount to wound bed as instructed EMMERY, SEILER R ([id number]) [id number] Page 5 of 9 Secondary Dressing: ABD Pad, 8x10 1 x Per Week/30 Days Discharge Instructions: Apply over primary dressing as directed. Secondary Dressing: Woven Gauze Sponge, Non-Sterile 4x4 in 1 x Per Week/30 Days Discharge Instructions: Apply over primary dressing as directed. Compression Wrap: Kerlix Roll 4.5x3.1 (in/yd) 1 x Per Week/30 Days Discharge Instructions: Apply Kerlix and Coban compression as directed. Compression Wrap: Coban Self-Adherent Wrap 4x5 (in/yd) 1 x Per Week/30 Days Discharge Instructions: Apply over Kerlix as directed. ****UNNA BOOT FIRST LAYER TO UPPER PORTION OF LOWER LEGS AND TO DORSAL FEET TO SECURE WRAPS IN PLACE. Electronic Signature(s) Signed: 04/20/2023 4:40:46 PM By: Baltazar Najjar MD Signed: 04/21/2023 4:07:14 PM By: Brenton Grills Entered By: Brenton Grills on 04/20/2023 11:01:11 -------------------------------------------------------------------------------- Problem List Details Patient Name: Date of  Service: [name], [name] 04/20/2023 10:30 A M Medical Record Number: [id number] Patient Account Number: [id number] Date of Birth/Sex: Treating RN: [birth year] (37 y.o. 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 04/20/2023 10:30 A M Medical Record Number: [id number] Patient Account Number: [id number] Date of Birth/Sex: Treating RN: 1985-12-01 (37 y.o. F) Primary Care Provider: Sanda Linger Other Clinician: Referring Provider: Treating Provider/Extender: Madelyn Flavors in Treatment: 4 Subjective History of Present Illness (HPI) ADMISSION 03/23/2023 This is a 37 year old woman who is Story is somewhat difficult to follow however she apparently has developed bilateral lower extremity swelling involving her feet and ankles roughly 3 weeks ago. This but this became associated with small painful areas on predominantly her left greater than right foot with exfoliation of the skin. She was admitted to hospital from 03/16/2023 through 03/19/2023. X-ray of the feet was negative however she was  noted to have a sedimentation rate greater than 90. Although she was empirically given antibiotics at the start of the admission. She was seen by infectious disease who did not feel this was infectious. It was felt that she  EVIANA, SIBILIA R ([id number]) [id number] Page 1 of 9 Visit Report for 04/20/2023 HPI Details Patient Name: Date of Service: GA MMA [name] NDRA R. 04/20/2023 10:30 A M Medical Record Number: [id number] Patient Account Number: [id number] Date of Birth/Sex: Treating RN: 1985-11-09 (37 y.o. F) Primary Care Provider: Sanda Linger Other Clinician: Referring Provider: Treating Provider/Extender: Madelyn Flavors in Treatment: 4 History of Present Illness HPI Description: ADMISSION 03/23/2023 This is a 37 year old woman who is Story is somewhat difficult to follow however she apparently has developed bilateral lower extremity swelling involving her feet and ankles roughly 3 weeks ago. This but this became associated with small painful areas on predominantly her left greater than right foot with exfoliation of the skin. She was admitted to hospital from 03/16/2023 through 03/19/2023. X-ray of the feet was negative however she was noted to have a sedimentation rate greater than 90. Although she was empirically given antibiotics at the start of the admission. She was seen by infectious disease who did not feel this was infectious. It was felt that she required a skin biopsy for possible vasculitis. Dermatology and rheumatology consults were suggested. She was discharged from the hospital on a gradually tapering dose of prednisone. She had cryoglobulins, complement levels measured. She did not have an active urinalysis. She has been soaking her feet in ice water and cold towels to relieve the pain. She saw her dermatologist who is in [place] yesterday for consideration of a skin biopsy. Per the patient's description the dermatologist did not wish to do a skin biopsy stating that he did not want to create another wound area although I do not have his or her notes at this point and I do not know what diagnosis she was felt to have. She arrives in clinic today with  discomfort in her feet. Exfoliating skin on the dorsal surfaces of both feet punched-out areas with necrosis especially on the left dorsal foot left lateral ankle. There is also areas on the right heel. Past medical history includes acute hepatitis question involving alcohol or hepatitis A, bilateral leg edema, obesity, hypertension, history of psoriasis on Taltz, chronic pain, apparently some form of idiopathic peripheral neuropathy, vitamin B12 deficiency ABIs in our clinic were 1.03 on the right 1.25 on the left Addendum 03/25/2023. I received the records from Dr. Cheree Ditto at [facility] dermatology in [place]. Looking at her record she felt that the issue was secondary to lymphedema and stasis dermatitis on both legs. I am not sure whether she looked at the areas on her feet which was the real area of concern. She specifically stated that she did not think that this was related to her psoriasis. Compression stockings were suggested. No biopsy was done 9/24; as noted above I did receive records from Dr. Cheree Ditto at [facility] dermatology and [place]. She felt the skin changes on her feet were related to lymphedema and stasis dermatitis. The patient arrived back in clinic the skin on her dorsal feet looks a lot better and she is in a lot less pain. HOWEVER she does have multiple small punched- out areas covered in black eschar. I did not biopsy this when I saw her the first time as she was going to dermatology the next day in retrospect that may not have been the right decision. 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 9/30; patient arrives in clinic with no complaints  EVIANA, SIBILIA R ([id number]) [id number] Page 1 of 9 Visit Report for 04/20/2023 HPI Details Patient Name: Date of Service: GA MMA [name] NDRA R. 04/20/2023 10:30 A M Medical Record Number: [id number] Patient Account Number: [id number] Date of Birth/Sex: Treating RN: 1985-11-09 (37 y.o. F) Primary Care Provider: Sanda Linger Other Clinician: Referring Provider: Treating Provider/Extender: Madelyn Flavors in Treatment: 4 History of Present Illness HPI Description: ADMISSION 03/23/2023 This is a 37 year old woman who is Story is somewhat difficult to follow however she apparently has developed bilateral lower extremity swelling involving her feet and ankles roughly 3 weeks ago. This but this became associated with small painful areas on predominantly her left greater than right foot with exfoliation of the skin. She was admitted to hospital from 03/16/2023 through 03/19/2023. X-ray of the feet was negative however she was noted to have a sedimentation rate greater than 90. Although she was empirically given antibiotics at the start of the admission. She was seen by infectious disease who did not feel this was infectious. It was felt that she required a skin biopsy for possible vasculitis. Dermatology and rheumatology consults were suggested. She was discharged from the hospital on a gradually tapering dose of prednisone. She had cryoglobulins, complement levels measured. She did not have an active urinalysis. She has been soaking her feet in ice water and cold towels to relieve the pain. She saw her dermatologist who is in [place] yesterday for consideration of a skin biopsy. Per the patient's description the dermatologist did not wish to do a skin biopsy stating that he did not want to create another wound area although I do not have his or her notes at this point and I do not know what diagnosis she was felt to have. She arrives in clinic today with  discomfort in her feet. Exfoliating skin on the dorsal surfaces of both feet punched-out areas with necrosis especially on the left dorsal foot left lateral ankle. There is also areas on the right heel. Past medical history includes acute hepatitis question involving alcohol or hepatitis A, bilateral leg edema, obesity, hypertension, history of psoriasis on Taltz, chronic pain, apparently some form of idiopathic peripheral neuropathy, vitamin B12 deficiency ABIs in our clinic were 1.03 on the right 1.25 on the left Addendum 03/25/2023. I received the records from Dr. Cheree Ditto at [facility] dermatology in [place]. Looking at her record she felt that the issue was secondary to lymphedema and stasis dermatitis on both legs. I am not sure whether she looked at the areas on her feet which was the real area of concern. She specifically stated that she did not think that this was related to her psoriasis. Compression stockings were suggested. No biopsy was done 9/24; as noted above I did receive records from Dr. Cheree Ditto at [facility] dermatology and [place]. She felt the skin changes on her feet were related to lymphedema and stasis dermatitis. The patient arrived back in clinic the skin on her dorsal feet looks a lot better and she is in a lot less pain. HOWEVER she does have multiple small punched- out areas covered in black eschar. I did not biopsy this when I saw her the first time as she was going to dermatology the next day in retrospect that may not have been the right decision. 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 9/30; patient arrives in clinic with no complaints  EVIANA, SIBILIA R ([id number]) [id number] Page 1 of 9 Visit Report for 04/20/2023 HPI Details Patient Name: Date of Service: GA MMA [name] NDRA R. 04/20/2023 10:30 A M Medical Record Number: [id number] Patient Account Number: [id number] Date of Birth/Sex: Treating RN: 1985-11-09 (37 y.o. F) Primary Care Provider: Sanda Linger Other Clinician: Referring Provider: Treating Provider/Extender: Madelyn Flavors in Treatment: 4 History of Present Illness HPI Description: ADMISSION 03/23/2023 This is a 37 year old woman who is Story is somewhat difficult to follow however she apparently has developed bilateral lower extremity swelling involving her feet and ankles roughly 3 weeks ago. This but this became associated with small painful areas on predominantly her left greater than right foot with exfoliation of the skin. She was admitted to hospital from 03/16/2023 through 03/19/2023. X-ray of the feet was negative however she was noted to have a sedimentation rate greater than 90. Although she was empirically given antibiotics at the start of the admission. She was seen by infectious disease who did not feel this was infectious. It was felt that she required a skin biopsy for possible vasculitis. Dermatology and rheumatology consults were suggested. She was discharged from the hospital on a gradually tapering dose of prednisone. She had cryoglobulins, complement levels measured. She did not have an active urinalysis. She has been soaking her feet in ice water and cold towels to relieve the pain. She saw her dermatologist who is in [place] yesterday for consideration of a skin biopsy. Per the patient's description the dermatologist did not wish to do a skin biopsy stating that he did not want to create another wound area although I do not have his or her notes at this point and I do not know what diagnosis she was felt to have. She arrives in clinic today with  discomfort in her feet. Exfoliating skin on the dorsal surfaces of both feet punched-out areas with necrosis especially on the left dorsal foot left lateral ankle. There is also areas on the right heel. Past medical history includes acute hepatitis question involving alcohol or hepatitis A, bilateral leg edema, obesity, hypertension, history of psoriasis on Taltz, chronic pain, apparently some form of idiopathic peripheral neuropathy, vitamin B12 deficiency ABIs in our clinic were 1.03 on the right 1.25 on the left Addendum 03/25/2023. I received the records from Dr. Cheree Ditto at [facility] dermatology in [place]. Looking at her record she felt that the issue was secondary to lymphedema and stasis dermatitis on both legs. I am not sure whether she looked at the areas on her feet which was the real area of concern. She specifically stated that she did not think that this was related to her psoriasis. Compression stockings were suggested. No biopsy was done 9/24; as noted above I did receive records from Dr. Cheree Ditto at [facility] dermatology and [place]. She felt the skin changes on her feet were related to lymphedema and stasis dermatitis. The patient arrived back in clinic the skin on her dorsal feet looks a lot better and she is in a lot less pain. HOWEVER she does have multiple small punched- out areas covered in black eschar. I did not biopsy this when I saw her the first time as she was going to dermatology the next day in retrospect that may not have been the right decision. 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 9/30; patient arrives in clinic with no complaints  of pain at all. This is quite an improvement from the first time I saw her. T the punched out holes on her dorsal o feet and right ankle we have been using Santyl. The eschar is loosening to the point where it might be a reasonably easy debridement. We  have also been using TCA and the skin on her feet looks a lot better. She is still on prednisone 40 mg. This was started tapering 60 mg 10 mg weekly in the hospital for vasculitis. Unfortunately no tissue biopsy was done. 10/8; again no pain. The skin in her bilateral dorsal feet looks better than when she first came into the clinic. We have been wrapping her there is no real edema. She comes into the clinic with the wraps falling all the way down however I am not sure what the issue here is. As it turns out nobody is following the prednisone dosage. She went to her primary doctor he did not really address this she is currently on 30 mg of prednisone. As we do not have a diagnosis here I am going to taper the prednisone a little more aggressively and follow the condition of her legs. If she develops other lesions then we will have to think about this but right now we are keeping her on high doses of prednisone without a diagnosis I simply do not agree with this. 10/15; I have tapered her down to 10 mg of prednisone I put in for 5 mg tablets enough for 5 days. Counseled her about steroid withdrawal also the implications for rapid deterioration in her underlying status. As I mentioned previously no concrete diagnosis is available. She did not receive a biopsy. 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 She arrived in clinic today with multiple areas on her bilateral dorsal feet smaller or healed but still some areas that have a necrotic surface although the covering eschar is certainly less adherent that when I first saw her Electronic Signature(s) Signed: 04/20/2023 4:40:46 PM By: Baltazar Najjar MD Entered By: Baltazar Najjar on 04/20/2023 12:23:54 Markus Jarvis R ([id number]) [id number] Page 2 of 9 -------------------------------------------------------------------------------- Physical Exam Details Patient Name: Date of Service: GA MMA [name] NDRA R. 04/20/2023 10:30 A M Medical Record Number: [id number] Patient Account Number: [id number] Date of Birth/Sex: Treating RN: [birth year] (37 y.o. F) Primary Care Provider: Sanda Linger Other Clinician: Referring Provider: Treating Provider/Extender: Madelyn Flavors in Treatment: 4 Constitutional Patient is hypertensive.. Pulse regular and within target range for patient.Marland Kitchen Respirations regular, non-labored and within target range.. Temperature is normal and within the target range for the patient.Marland Kitchen Appears in no distress. Notes Wound exam; the skin on her feet which was literally flaking off when she came in is a lot better. Still multiple punched-out areas which have morphed into wounds. Most of these look satisfactory some of actually closed. She has also some areas on the right lateral calcaneus. Electronic Signature(s) Signed: 04/20/2023 4:40:46 PM By: Baltazar Najjar MD Entered By: Baltazar Najjar on 04/20/2023 12:25:20 -------------------------------------------------------------------------------- Physician Orders Details Patient Name: Date of Service: [name], [name] 04/20/2023 10:30 A M Medical Record Number: [id number] Patient Account Number: [id number] Date of Birth/Sex: Treating RN: 1985-10-22 (37 y.o. [name] Primary Care Provider: Sanda Linger Other Clinician: Referring Provider: Treating Provider/Extender: Madelyn Flavors in Treatment: 4 Verbal / Phone Orders: No Diagnosis Coding Follow-up Appointments ppointment in 1 week. - Dr. Leanord Hawking 04/27/2023 1030 Return A  Calcaneus Wound Laterality: Right, Lateral Cleanser: Soap and Water 1 x Per Week/30 Days Discharge Instructions: May shower and wash wound with dial  antibacterial soap and water prior to dressing change. Cleanser: Vashe 5.8 (oz) 1 x Per Week/30 Days Discharge Instructions: Cleanse the wound with Vashe prior to applying a clean dressing using gauze sponges, not tissue or cotton balls. Peri-Wound Care: Triamcinolone 15 (g) 1 x Per Week/30 Days Discharge Instructions: Use triamcinolone ****liberally*** Peri-Wound Care: Sween Lotion (Moisturizing lotion) 1 x Per Week/30 Days Discharge Instructions: Apply moisturizing lotion as directed Topical: Triamcinolone 1 x Per Week/30 Days Discharge Instructions: Apply Triamcinolone as directed liberallly Prim Dressing: Hydrofera Blue Ready Transfer Foam, 2.5x2.5 (in/in) 1 x Per Week/30 Days ary Discharge Instructions: Apply directly to wound bed as directed Prim Dressing: Santyl Ointment 1 x Per Week/30 Days ary Discharge Instructions: Apply nickel thick amount to wound bed as instructed EMMERY, SEILER R ([id number]) [id number] Page 5 of 9 Secondary Dressing: ABD Pad, 8x10 1 x Per Week/30 Days Discharge Instructions: Apply over primary dressing as directed. Secondary Dressing: Woven Gauze Sponge, Non-Sterile 4x4 in 1 x Per Week/30 Days Discharge Instructions: Apply over primary dressing as directed. Compression Wrap: Kerlix Roll 4.5x3.1 (in/yd) 1 x Per Week/30 Days Discharge Instructions: Apply Kerlix and Coban compression as directed. Compression Wrap: Coban Self-Adherent Wrap 4x5 (in/yd) 1 x Per Week/30 Days Discharge Instructions: Apply over Kerlix as directed. ****UNNA BOOT FIRST LAYER TO UPPER PORTION OF LOWER LEGS AND TO DORSAL FEET TO SECURE WRAPS IN PLACE. Electronic Signature(s) Signed: 04/20/2023 4:40:46 PM By: Baltazar Najjar MD Signed: 04/21/2023 4:07:14 PM By: Brenton Grills Entered By: Brenton Grills on 04/20/2023 11:01:11 -------------------------------------------------------------------------------- Problem List Details Patient Name: Date of  Service: [name], [name] 04/20/2023 10:30 A M Medical Record Number: [id number] Patient Account Number: [id number] Date of Birth/Sex: Treating RN: [birth year] (37 y.o. 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 04/20/2023 10:30 A M Medical Record Number: [id number] Patient Account Number: [id number] Date of Birth/Sex: Treating RN: 1985-12-01 (37 y.o. F) Primary Care Provider: Sanda Linger Other Clinician: Referring Provider: Treating Provider/Extender: Madelyn Flavors in Treatment: 4 Subjective History of Present Illness (HPI) ADMISSION 03/23/2023 This is a 37 year old woman who is Story is somewhat difficult to follow however she apparently has developed bilateral lower extremity swelling involving her feet and ankles roughly 3 weeks ago. This but this became associated with small painful areas on predominantly her left greater than right foot with exfoliation of the skin. She was admitted to hospital from 03/16/2023 through 03/19/2023. X-ray of the feet was negative however she was  noted to have a sedimentation rate greater than 90. Although she was empirically given antibiotics at the start of the admission. She was seen by infectious disease who did not feel this was infectious. It was felt that she  EVIANA, SIBILIA R ([id number]) [id number] Page 1 of 9 Visit Report for 04/20/2023 HPI Details Patient Name: Date of Service: GA MMA [name] NDRA R. 04/20/2023 10:30 A M Medical Record Number: [id number] Patient Account Number: [id number] Date of Birth/Sex: Treating RN: 1985-11-09 (37 y.o. F) Primary Care Provider: Sanda Linger Other Clinician: Referring Provider: Treating Provider/Extender: Madelyn Flavors in Treatment: 4 History of Present Illness HPI Description: ADMISSION 03/23/2023 This is a 37 year old woman who is Story is somewhat difficult to follow however she apparently has developed bilateral lower extremity swelling involving her feet and ankles roughly 3 weeks ago. This but this became associated with small painful areas on predominantly her left greater than right foot with exfoliation of the skin. She was admitted to hospital from 03/16/2023 through 03/19/2023. X-ray of the feet was negative however she was noted to have a sedimentation rate greater than 90. Although she was empirically given antibiotics at the start of the admission. She was seen by infectious disease who did not feel this was infectious. It was felt that she required a skin biopsy for possible vasculitis. Dermatology and rheumatology consults were suggested. She was discharged from the hospital on a gradually tapering dose of prednisone. She had cryoglobulins, complement levels measured. She did not have an active urinalysis. She has been soaking her feet in ice water and cold towels to relieve the pain. She saw her dermatologist who is in [place] yesterday for consideration of a skin biopsy. Per the patient's description the dermatologist did not wish to do a skin biopsy stating that he did not want to create another wound area although I do not have his or her notes at this point and I do not know what diagnosis she was felt to have. She arrives in clinic today with  discomfort in her feet. Exfoliating skin on the dorsal surfaces of both feet punched-out areas with necrosis especially on the left dorsal foot left lateral ankle. There is also areas on the right heel. Past medical history includes acute hepatitis question involving alcohol or hepatitis A, bilateral leg edema, obesity, hypertension, history of psoriasis on Taltz, chronic pain, apparently some form of idiopathic peripheral neuropathy, vitamin B12 deficiency ABIs in our clinic were 1.03 on the right 1.25 on the left Addendum 03/25/2023. I received the records from Dr. Cheree Ditto at [facility] dermatology in [place]. Looking at her record she felt that the issue was secondary to lymphedema and stasis dermatitis on both legs. I am not sure whether she looked at the areas on her feet which was the real area of concern. She specifically stated that she did not think that this was related to her psoriasis. Compression stockings were suggested. No biopsy was done 9/24; as noted above I did receive records from Dr. Cheree Ditto at [facility] dermatology and [place]. She felt the skin changes on her feet were related to lymphedema and stasis dermatitis. The patient arrived back in clinic the skin on her dorsal feet looks a lot better and she is in a lot less pain. HOWEVER she does have multiple small punched- out areas covered in black eschar. I did not biopsy this when I saw her the first time as she was going to dermatology the next day in retrospect that may not have been the right decision. 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 9/30; patient arrives in clinic with no complaints

## 2023-04-21 NOTE — Telephone Encounter (Signed)
Attempted to call patient for pharmacist phone follow up for hypokalemia and BP. Pt did not come for labwork this week as well. Unable to leave message.  Arbutus Leas, PharmD, BCPS [facility] [phone]

## 2023-04-27 ENCOUNTER — Encounter (HOSPITAL_BASED_OUTPATIENT_CLINIC_OR_DEPARTMENT_OTHER): Payer: BC Managed Care – PPO | Admitting: Internal Medicine

## 2023-04-27 DIAGNOSIS — I890 Lymphedema, not elsewhere classified: Secondary | ICD-10-CM | POA: Diagnosis not present

## 2023-04-27 DIAGNOSIS — L409 Psoriasis, unspecified: Secondary | ICD-10-CM | POA: Diagnosis not present

## 2023-04-27 DIAGNOSIS — L958 Other vasculitis limited to the skin: Secondary | ICD-10-CM | POA: Diagnosis not present

## 2023-04-27 DIAGNOSIS — Z79620 Long term (current) use of immunosuppressive biologic: Secondary | ICD-10-CM | POA: Diagnosis not present

## 2023-04-27 DIAGNOSIS — L97528 Non-pressure chronic ulcer of other part of left foot with other specified severity: Secondary | ICD-10-CM | POA: Diagnosis not present

## 2023-04-27 DIAGNOSIS — L97518 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 severity: Secondary | ICD-10-CM | POA: Diagnosis not present

## 2023-04-30 ENCOUNTER — Telehealth: Payer: Self-pay

## 2023-04-30 NOTE — Telephone Encounter (Signed)
Updated [facility] paperwork successfully faxed.

## 2023-05-03 ENCOUNTER — Encounter (HOSPITAL_BASED_OUTPATIENT_CLINIC_OR_DEPARTMENT_OTHER): Payer: BC Managed Care – PPO | Admitting: Internal Medicine

## 2023-05-03 DIAGNOSIS — L97412 Non-pressure chronic ulcer of right heel and midfoot with fat layer exposed: Secondary | ICD-10-CM | POA: Diagnosis not present

## 2023-05-03 DIAGNOSIS — L958 Other vasculitis limited to the skin: Secondary | ICD-10-CM | POA: Diagnosis not present

## 2023-05-03 DIAGNOSIS — Z79620 Long term (current) use of immunosuppressive biologic: Secondary | ICD-10-CM | POA: Diagnosis not present

## 2023-05-03 DIAGNOSIS — L97518 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 severity: Secondary | ICD-10-CM | POA: Diagnosis not present

## 2023-05-03 DIAGNOSIS — I890 Lymphedema, not elsewhere classified: Secondary | ICD-10-CM | POA: Diagnosis not present

## 2023-05-03 DIAGNOSIS — L409 Psoriasis, unspecified: Secondary | ICD-10-CM | POA: Diagnosis not present

## 2023-05-03 DIAGNOSIS — L97522 Non-pressure chronic ulcer of other part of left foot with fat layer exposed: Secondary | ICD-10-CM | POA: Diagnosis not present

## 2023-05-03 DIAGNOSIS — L97512 Non-pressure chronic ulcer of other part of right foot with fat layer exposed: Secondary | ICD-10-CM | POA: Diagnosis not present

## 2023-05-03 DIAGNOSIS — L97528 Non-pressure chronic ulcer of other part of left foot with other specified severity: Secondary | ICD-10-CM | POA: Diagnosis not present

## 2023-05-04 NOTE — Progress Notes (Signed)
Care: Sween Lotion (Moisturizing lotion) 1 x Per Week/30 Days Discharge Instructions: Apply moisturizing lotion as directed Topical: Triamcinolone 1 x Per Week/30 Days Discharge Instructions: Apply Triamcinolone as directed liberallly Prim Dressing: Hydrofera Blue Ready Transfer Foam, 2.5x2.5 (in/in) 1 x Per Week/30 Days ary Discharge Instructions: Apply directly to wound bed as directed Prim  Dressing: Santyl Ointment 1 x Per Week/30 Days ary Discharge Instructions: Apply nickel thick amount to wound bed as instructed Secondary Dressing: ABD Pad, 8x10 1 x Per Week/30 Days Discharge Instructions: Apply over primary dressing as directed. Secondary Dressing: Woven Gauze Sponge, Non-Sterile 4x4 in 1 x Per Week/30 Days Discharge Instructions: Apply over primary dressing as directed. Com pression Wrap: Kerlix Roll 4.5x3.1 (in/yd) 1 x Per Week/30 Days Discharge Instructions: Apply Kerlix and Coban compression as directed. Com pression Wrap: Coban Self-Adherent Wrap 4x5 (in/yd) 1 x Per Week/30 Days Discharge Instructions: Apply over Kerlix as directed. 1. No new issues with the steroid withdrawal either new lesions or steroid insufficiency. 2. Almost all of her wounds are improved except for 1 area on the right dorsal foot. She agreed to allow debridement next week if that has not auto debrided. 3. Continue with Santyl, TCA Hydrofera Blue under compression. 4. She will be off steroids by the time I see her next time. 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 I have initiated the more abrupt steroid withdrawal largely because we did not have a diagnosis that would justify problems if we could not stop the prednisone Electronic Signature(s) Signed: 05/04/2023 8:53:04 AM By: Baltazar Najjar MD Entered By: Baltazar Najjar on 05/03/2023 08:53:51 [name], [name] ([id number]) [id number] Page 13 of 13 -------------------------------------------------------------------------------- SuperBill Details Patient Name: Date of Service: GA MMA [name] 05/03/2023 Medical Record Number: [id number] Patient Account Number: [id number] Date of Birth/Sex: Treating RN: 03/09/1986 (37 y.o. 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 limited to the skin Facility Procedures : CPT4 Code: 07371062 Description: 69485 - DEBRIDE W/O ANES NON SELECT Modifier: Quantity: 1 Physician Procedures : CPT4 Code Description Modifier [id number] - WC PHYS LEVEL 4 - EST PT ICD-10 Diagnosis Description 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 Still using Santyl, Hydrofera Blue on the open areas on her feet and ankles. We are still putting her in compression. Liberal use of triamcinolone under the compression The wounds are a lot better today many of them are healed. No new wound areas 10/28; will be finished her prednisone by the middle part of this week. Still using Santyl, Hydrofera Blue and TCA under compression to her bilateral feet. There are no open wounds. Almost all of her open areas have a clean surface except for 1 area on the right dorsal foot. She did not wish to be debrided this week rather she stated she would put that off till next week Electronic Signature(s) Signed: 05/04/2023 8:53:04 AM By: Baltazar Najjar MD [name] ([id number]) [phone].pdf Page 5 of 13 Entered By: Baltazar Najjar on 05/03/2023 08:50:14 -------------------------------------------------------------------------------- Physical Exam Details Patient Name: Date of Service: GA MMA [name] 05/03/2023 9:45 A M Medical Record Number: [id number] Patient Account Number: [id number] Date of  Birth/Sex: Treating RN: 18-Feb-1986 (37 y.o. F) Primary Care Provider: Sanda Linger Other Clinician: Referring Provider: Treating Provider/Extender: Madelyn Flavors in Treatment: 5 Constitutional Sitting or standing Blood Pressure is within target [name] for patient.. Pulse regular and within target [name] for patient.Marland Kitchen Respirations regular, non-labored and within target [name].. Temperature is normal and within the target [name] for the patient.Marland Kitchen Appears in no distress. Notes Wound exam; the major wound on the left foot is near the fourth and fifth toes dorsally. This has a clean looking surface rims of new skin. Several small areas I think are closed today on her bilateral dorsal feet. The areas on her lateral right heel also look a lot better. She still has 1 area on the left dorsal foot medially that still looks as though it needs mechanical debridement. The surrounding skin looks normal. A lot better than when she first came here Electronic Signature(s) Signed: 05/04/2023 8:53:04 AM By: Baltazar Najjar MD Entered By: Baltazar Najjar on 05/03/2023 08:51:24 -------------------------------------------------------------------------------- Physician Orders Details Patient Name: Date of Service: [name], [name]. 05/03/2023 9:45 A M Medical Record Number: [id number] Patient Account Number: [id number] Date of Birth/Sex: Treating RN: 01/21/1986 (37 y.o. 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 - Dr. Leanord Hawking - 05/10/2023 0945 Return A ppointment in 2 weeks. - Dr. Leanord Hawking - 05/17/2023 0945 Return A Return appointment in 3 weeks. - Dr. Leanord Hawking (front office to schedule) Return appointment in 1 month. - Dr. Leanord Hawking (front office to schedule) Other: - ********Start tapering off prednisone- 3 tablets a day for three days, 2 tablets a day for three days, 1 tablet a day for three days. Then at next appt may provide you with 5mg  tablets.************ ****COMPRESSION WRAPS STOP JUST BELOW THE CALVES.**** Anesthetic (In clinic) Topical Lidocaine 4% applied to wound bed Bathing/ Shower/ Hygiene May shower with protection but do not get wound dressing(s) wet. Protect  [name], [name] ([id number]) [id number] Page 1 of 13 Visit Report for 05/03/2023 Debridement Details Patient Name: Date of Service: GA MMA [name] 05/03/2023 9:45 A M Medical Record Number: [id number] Patient Account Number: [id number] Date of Birth/Sex: Treating RN: 10/15/1985 (37 y.o. [name], [name] Primary Care Provider: Sanda Linger Other Clinician: Referring Provider: Treating Provider/Extender: Madelyn Flavors in Treatment: 5 Debridement Performed for Assessment: Wound #1 Left,Medial Foot Performed By: Clinician Shawn Stall, RN Debridement Type: Chemical/Enzymatic/Mechanical Agent Used: Santyl Level of Consciousness (Pre-procedure): Awake and Alert Pre-procedure Verification/Time Out No Taken: Percent of Wound Bed Debrided: Bleeding: None Response to Treatment: Procedure was tolerated well Level of Consciousness (Post- Awake and Alert procedure): Post Debridement Measurements of Total Wound Length: (cm) 0.5 Width: (cm) 4.6 Depth: (cm) 0.2 Volume: (cm) 0.361 Character of Wound/Ulcer Post Debridement: Requires Further Debridement Post Procedure Diagnosis Same as Pre-procedure Electronic Signature(s) Signed: 05/03/2023 6:15:51 PM By: Shawn Stall RN, BSN Signed: 05/04/2023 8:53:04 AM By: Baltazar Najjar MD Entered By: Shawn Stall on 05/03/2023 08:11:09 -------------------------------------------------------------------------------- Debridement Details Patient Name: Date of Service: [name], [name]. 05/03/2023 9:45 A M Medical Record Number: [id number] Patient Account Number: [id number] Date of Birth/Sex: Treating RN: 09/03/1985 (37 y.o. [name], [name] Primary Care Provider: Sanda Linger Other Clinician: Referring Provider: Treating Provider/Extender: Madelyn Flavors in Treatment: 5 Debridement Performed for Assessment: Wound #2 Left,Distal Foot Performed By: Clinician Shawn Stall, RN Debridement Type: Chemical/Enzymatic/Mechanical Agent Used: Santyl Level of Consciousness (Pre-procedure): Awake and Alert Pre-procedure Verification/Time Out No Taken: Percent of Wound Bed Debrided: Bleeding: None Response to Treatment: Procedure was tolerated well Level of Consciousness (Post- Awake and Alert procedure): [name], [name] ([id number]) [id number] Page 2 of 13 Post Debridement Measurements of Total Wound Length: (cm) 1.5 Width: (cm) 2 Depth: (cm) 0.1 Volume: (cm) 0.236 Character of Wound/Ulcer Post Debridement: Requires Further Debridement Post Procedure Diagnosis Same as Pre-procedure Electronic Signature(s) Signed: 05/03/2023 6:15:51 PM By: Shawn Stall RN, BSN Signed: 05/04/2023 8:53:04 AM By: Baltazar Najjar MD Entered By: Shawn Stall on 05/03/2023 08:11:26 -------------------------------------------------------------------------------- Debridement Details Patient Name: Date of Service: [name], [name]. 05/03/2023 9:45 A M Medical Record Number: [id number] Patient Account Number: [id number] Date of Birth/Sex: Treating RN: May 30, 1986 (37 y.o. [name], [name] Primary Care Provider: Sanda Linger Other Clinician: Referring Provider: Treating Provider/Extender: Madelyn Flavors in Treatment: 5 Debridement Performed for Assessment: Wound #3 Left,Proximal,Dorsal Foot Performed By: Clinician Shawn Stall, RN Debridement Type: Chemical/Enzymatic/Mechanical Agent Used: Santyl Level of Consciousness (Pre-procedure): Awake and Alert Pre-procedure Verification/Time Out No Taken: Percent of Wound Bed Debrided: Bleeding: None Response to Treatment: Procedure was tolerated well Level of Consciousness (Post- Awake and Alert procedure): Post Debridement Measurements of Total Wound Length: (cm) 5.5 Width: (cm) 5.5 Depth: (cm) 0.2 Volume: (cm) 4.752 Character of Wound/Ulcer Post Debridement:  Requires Further Debridement Post Procedure Diagnosis Same as Pre-procedure Electronic Signature(s) Signed: 05/03/2023 6:15:51 PM By: Shawn Stall RN, BSN Signed: 05/04/2023 8:53:04 AM By: Baltazar Najjar MD Entered By: Shawn Stall on 05/03/2023 08:11:52 -------------------------------------------------------------------------------- Debridement Details Patient Name: Date of Service: [name], [name]. 05/03/2023 9:45 A M Medical Record Number: [id number] Patient Account Number: [id number] Date of Birth/Sex: Treating RN: [birth year] (37 y.o. [name] Primary Care Provider: Sanda Linger Other Clinician: [name] ([id number]) [id number] Page 3 of 13 Referring Provider: Treating Provider/Extender: Madelyn Flavors in Treatment: 5 Debridement Performed for Assessment: Wound #4 Right,Dorsal Foot  Performed By: Clinician Shawn Stall, RN Debridement Type: Chemical/Enzymatic/Mechanical Agent Used: Santyl Level of Consciousness (Pre-procedure): Awake and Alert Pre-procedure Verification/Time Out No Taken: Percent of Wound Bed Debrided: Bleeding: None Response to Treatment: Procedure was tolerated well Level of Consciousness (Post- Awake and Alert procedure): Post Debridement Measurements of Total Wound Length: (cm) 5.2 Width: (cm) 7.9 Depth: (cm) 0.3 Volume: (cm) 9.679 Character of Wound/Ulcer Post Debridement: Requires Further Debridement Post Procedure Diagnosis Same as Pre-procedure Electronic Signature(s) Signed: 05/03/2023 6:15:51 PM By: Shawn Stall RN, BSN Signed: 05/04/2023 8:53:04 AM By: Baltazar Najjar MD Entered By: Shawn Stall on 05/03/2023 08:12:11 -------------------------------------------------------------------------------- Debridement Details Patient Name: Date of Service: [name], [name]. 05/03/2023 9:45 A M Medical Record Number: [id number] Patient Account Number: [id number] Date  of Birth/Sex: Treating RN: [birth year] (37 y.o. [name], [name] Primary Care Provider: Sanda Linger Other Clinician: Referring Provider: Treating Provider/Extender: Madelyn Flavors in Treatment: 5 Debridement Performed for Assessment: Wound #5 Right,Lateral Calcaneus Performed By: Clinician Shawn Stall, RN Debridement Type: Chemical/Enzymatic/Mechanical Agent Used: Santyl Level of Consciousness (Pre-procedure): Awake and Alert Pre-procedure Verification/Time Out No Taken: Percent of Wound Bed Debrided: Bleeding: None Response to Treatment: Procedure was tolerated well Level of Consciousness (Post- Awake and Alert procedure): Post Debridement Measurements of Total Wound Length: (cm) 0.4 Width: (cm) 0.5 Depth: (cm) 0.1 Volume: (cm) 0.016 Character of Wound/Ulcer Post Debridement: Requires Further Debridement Post Procedure Diagnosis Same as Pre-procedure Electronic Signature(s) Signed: 05/03/2023 6:15:51 PM By: Shawn Stall RN, BSN Signed: 05/04/2023 8:53:04 AM By: Baltazar Najjar MD Entered By: Shawn Stall on 05/03/2023 08:12:27 [name] ([id number]) [id number] Page 4 of 13 -------------------------------------------------------------------------------- HPI Details Patient Name: Date of Service: GA MMA [name] 05/03/2023 9:45 A M Medical Record Number: [id number] Patient Account Number: [id number] Date of Birth/Sex: Treating RN: 12-Apr-1986 (37 y.o. F) Primary Care Provider: Sanda Linger Other Clinician: Referring Provider: Treating Provider/Extender: Madelyn Flavors in Treatment: 5 History of Present Illness HPI Description: ADMISSION 03/23/2023 This is a 37 year old woman who is Story is somewhat difficult to follow however she apparently has developed bilateral lower extremity swelling involving her feet and ankles roughly 3 weeks ago. This but this became associated with small  painful areas on predominantly her left greater than right foot with exfoliation of the skin. She was admitted to hospital from 03/16/2023 through 03/19/2023. X-ray of the feet was negative however she was noted to have a sedimentation rate greater than 90. Although she was empirically given antibiotics at the start of the admission. She was seen by infectious disease who did not feel this was infectious. It was felt that she required a skin biopsy for possible vasculitis. Dermatology and rheumatology consults were suggested. She was discharged from the hospital on a gradually tapering dose of prednisone. She had cryoglobulins, complement levels measured. She did not have an active urinalysis. She has been soaking her feet in ice water and cold towels to relieve the pain. She saw her dermatologist who is in [place] yesterday for consideration of a skin biopsy. Per the patient's description the dermatologist did not wish to do a skin biopsy stating that he did not want to create another wound area although I do not have his or her notes at this point and I do not know what diagnosis she was felt to have. She arrives in clinic today with discomfort in her feet. Exfoliating skin on the dorsal surfaces of both feet punched-out areas with necrosis especially on the  Care: Sween Lotion (Moisturizing lotion) 1 x Per Week/30 Days Discharge Instructions: Apply moisturizing lotion as directed Topical: Triamcinolone 1 x Per Week/30 Days Discharge Instructions: Apply Triamcinolone as directed liberallly Prim Dressing: Hydrofera Blue Ready Transfer Foam, 2.5x2.5 (in/in) 1 x Per Week/30 Days ary Discharge Instructions: Apply directly to wound bed as directed Prim  Dressing: Santyl Ointment 1 x Per Week/30 Days ary Discharge Instructions: Apply nickel thick amount to wound bed as instructed Secondary Dressing: ABD Pad, 8x10 1 x Per Week/30 Days Discharge Instructions: Apply over primary dressing as directed. Secondary Dressing: Woven Gauze Sponge, Non-Sterile 4x4 in 1 x Per Week/30 Days Discharge Instructions: Apply over primary dressing as directed. Com pression Wrap: Kerlix Roll 4.5x3.1 (in/yd) 1 x Per Week/30 Days Discharge Instructions: Apply Kerlix and Coban compression as directed. Com pression Wrap: Coban Self-Adherent Wrap 4x5 (in/yd) 1 x Per Week/30 Days Discharge Instructions: Apply over Kerlix as directed. 1. No new issues with the steroid withdrawal either new lesions or steroid insufficiency. 2. Almost all of her wounds are improved except for 1 area on the right dorsal foot. She agreed to allow debridement next week if that has not auto debrided. 3. Continue with Santyl, TCA Hydrofera Blue under compression. 4. She will be off steroids by the time I see her next time. 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 I have initiated the more abrupt steroid withdrawal largely because we did not have a diagnosis that would justify problems if we could not stop the prednisone Electronic Signature(s) Signed: 05/04/2023 8:53:04 AM By: Baltazar Najjar MD Entered By: Baltazar Najjar on 05/03/2023 08:53:51 [name], [name] ([id number]) [id number] Page 13 of 13 -------------------------------------------------------------------------------- SuperBill Details Patient Name: Date of Service: GA MMA [name] 05/03/2023 Medical Record Number: [id number] Patient Account Number: [id number] Date of Birth/Sex: Treating RN: 03/09/1986 (37 y.o. 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 limited to the skin Facility Procedures : CPT4 Code: 07371062 Description: 69485 - DEBRIDE W/O ANES NON SELECT Modifier: Quantity: 1 Physician Procedures : CPT4 Code Description Modifier [id number] - WC PHYS LEVEL 4 - EST PT ICD-10 Diagnosis Description L97.518 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 severity L97.528 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 05/03/2023 9:45 A M Medical Record Number: [id number] Patient Account Number: [id number] Date of Birth/Sex: Treating RN: 10/15/1985 (37 y.o. [name], [name] Primary Care Provider: Sanda Linger Other Clinician: Referring Provider: Treating Provider/Extender: Madelyn Flavors in Treatment: 5 Debridement Performed for Assessment: Wound #1 Left,Medial Foot Performed By: Clinician Shawn Stall, RN Debridement Type: Chemical/Enzymatic/Mechanical Agent Used: Santyl Level of Consciousness (Pre-procedure): Awake and Alert Pre-procedure Verification/Time Out No Taken: Percent of Wound Bed Debrided: Bleeding: None Response to Treatment: Procedure was tolerated well Level of Consciousness (Post- Awake and Alert procedure): Post Debridement Measurements of Total Wound Length: (cm) 0.5 Width: (cm) 4.6 Depth: (cm) 0.2 Volume: (cm) 0.361 Character of Wound/Ulcer Post Debridement: Requires Further Debridement Post Procedure Diagnosis Same as Pre-procedure Electronic Signature(s) Signed: 05/03/2023 6:15:51 PM By: Shawn Stall RN, BSN Signed: 05/04/2023 8:53:04 AM By: Baltazar Najjar MD Entered By: Shawn Stall on 05/03/2023 08:11:09 -------------------------------------------------------------------------------- Debridement Details Patient Name: Date of Service: [name], [name]. 05/03/2023 9:45 A M Medical Record Number: [id number] Patient Account Number: [id number] Date of Birth/Sex: Treating RN: 09/03/1985 (37 y.o. [name], [name] Primary Care Provider: Sanda Linger Other Clinician: Referring Provider: Treating Provider/Extender: Madelyn Flavors in Treatment: 5 Debridement Performed for Assessment: Wound #2 Left,Distal Foot Performed By: Clinician Shawn Stall, RN Debridement Type: Chemical/Enzymatic/Mechanical Agent Used: Santyl Level of Consciousness (Pre-procedure): Awake and Alert Pre-procedure Verification/Time Out No Taken: Percent of Wound Bed Debrided: Bleeding: None Response to Treatment: Procedure was tolerated well Level of Consciousness (Post- Awake and Alert procedure): [name], [name] ([id number]) [id number] Page 2 of 13 Post Debridement Measurements of Total Wound Length: (cm) 1.5 Width: (cm) 2 Depth: (cm) 0.1 Volume: (cm) 0.236 Character of Wound/Ulcer Post Debridement: Requires Further Debridement Post Procedure Diagnosis Same as Pre-procedure Electronic Signature(s) Signed: 05/03/2023 6:15:51 PM By: Shawn Stall RN, BSN Signed: 05/04/2023 8:53:04 AM By: Baltazar Najjar MD Entered By: Shawn Stall on 05/03/2023 08:11:26 -------------------------------------------------------------------------------- Debridement Details Patient Name: Date of Service: [name], [name]. 05/03/2023 9:45 A M Medical Record Number: [id number] Patient Account Number: [id number] Date of Birth/Sex: Treating RN: May 30, 1986 (37 y.o. [name], [name] Primary Care Provider: Sanda Linger Other Clinician: Referring Provider: Treating Provider/Extender: Madelyn Flavors in Treatment: 5 Debridement Performed for Assessment: Wound #3 Left,Proximal,Dorsal Foot Performed By: Clinician Shawn Stall, RN Debridement Type: Chemical/Enzymatic/Mechanical Agent Used: Santyl Level of Consciousness (Pre-procedure): Awake and Alert Pre-procedure Verification/Time Out No Taken: Percent of Wound Bed Debrided: Bleeding: None Response to Treatment: Procedure was tolerated well Level of Consciousness (Post- Awake and Alert procedure): Post Debridement Measurements of Total Wound Length: (cm) 5.5 Width: (cm) 5.5 Depth: (cm) 0.2 Volume: (cm) 4.752 Character of Wound/Ulcer Post Debridement:  Requires Further Debridement Post Procedure Diagnosis Same as Pre-procedure Electronic Signature(s) Signed: 05/03/2023 6:15:51 PM By: Shawn Stall RN, BSN Signed: 05/04/2023 8:53:04 AM By: Baltazar Najjar MD Entered By: Shawn Stall on 05/03/2023 08:11:52 -------------------------------------------------------------------------------- Debridement Details Patient Name: Date of Service: [name], [name]. 05/03/2023 9:45 A M Medical Record Number: [id number] Patient Account Number: [id number] Date of Birth/Sex: Treating RN: [birth year] (37 y.o. [name] Primary Care Provider: Sanda Linger Other Clinician: [name] ([id number]) [id number] Page 3 of 13 Referring Provider: Treating Provider/Extender: Madelyn Flavors in Treatment: 5 Debridement Performed for Assessment: Wound #4 Right,Dorsal Foot  Performed By: Clinician Shawn Stall, RN Debridement Type: Chemical/Enzymatic/Mechanical Agent Used: Santyl Level of Consciousness (Pre-procedure): Awake and Alert Pre-procedure Verification/Time Out No Taken: Percent of Wound Bed Debrided: Bleeding: None Response to Treatment: Procedure was tolerated well Level of Consciousness (Post- Awake and Alert procedure): Post Debridement Measurements of Total Wound Length: (cm) 5.2 Width: (cm) 7.9 Depth: (cm) 0.3 Volume: (cm) 9.679 Character of Wound/Ulcer Post Debridement: Requires Further Debridement Post Procedure Diagnosis Same as Pre-procedure Electronic Signature(s) Signed: 05/03/2023 6:15:51 PM By: Shawn Stall RN, BSN Signed: 05/04/2023 8:53:04 AM By: Baltazar Najjar MD Entered By: Shawn Stall on 05/03/2023 08:12:11 -------------------------------------------------------------------------------- Debridement Details Patient Name: Date of Service: [name], [name]. 05/03/2023 9:45 A M Medical Record Number: [id number] Patient Account Number: [id number] Date  of Birth/Sex: Treating RN: [birth year] (37 y.o. [name], [name] Primary Care Provider: Sanda Linger Other Clinician: Referring Provider: Treating Provider/Extender: Madelyn Flavors in Treatment: 5 Debridement Performed for Assessment: Wound #5 Right,Lateral Calcaneus Performed By: Clinician Shawn Stall, RN Debridement Type: Chemical/Enzymatic/Mechanical Agent Used: Santyl Level of Consciousness (Pre-procedure): Awake and Alert Pre-procedure Verification/Time Out No Taken: Percent of Wound Bed Debrided: Bleeding: None Response to Treatment: Procedure was tolerated well Level of Consciousness (Post- Awake and Alert procedure): Post Debridement Measurements of Total Wound Length: (cm) 0.4 Width: (cm) 0.5 Depth: (cm) 0.1 Volume: (cm) 0.016 Character of Wound/Ulcer Post Debridement: Requires Further Debridement Post Procedure Diagnosis Same as Pre-procedure Electronic Signature(s) Signed: 05/03/2023 6:15:51 PM By: Shawn Stall RN, BSN Signed: 05/04/2023 8:53:04 AM By: Baltazar Najjar MD Entered By: Shawn Stall on 05/03/2023 08:12:27 [name] ([id number]) [id number] Page 4 of 13 -------------------------------------------------------------------------------- HPI Details Patient Name: Date of Service: GA MMA [name] 05/03/2023 9:45 A M Medical Record Number: [id number] Patient Account Number: [id number] Date of Birth/Sex: Treating RN: 12-Apr-1986 (37 y.o. F) Primary Care Provider: Sanda Linger Other Clinician: Referring Provider: Treating Provider/Extender: Madelyn Flavors in Treatment: 5 History of Present Illness HPI Description: ADMISSION 03/23/2023 This is a 37 year old woman who is Story is somewhat difficult to follow however she apparently has developed bilateral lower extremity swelling involving her feet and ankles roughly 3 weeks ago. This but this became associated with small  painful areas on predominantly her left greater than right foot with exfoliation of the skin. She was admitted to hospital from 03/16/2023 through 03/19/2023. X-ray of the feet was negative however she was noted to have a sedimentation rate greater than 90. Although she was empirically given antibiotics at the start of the admission. She was seen by infectious disease who did not feel this was infectious. It was felt that she required a skin biopsy for possible vasculitis. Dermatology and rheumatology consults were suggested. She was discharged from the hospital on a gradually tapering dose of prednisone. She had cryoglobulins, complement levels measured. She did not have an active urinalysis. She has been soaking her feet in ice water and cold towels to relieve the pain. She saw her dermatologist who is in [place] yesterday for consideration of a skin biopsy. Per the patient's description the dermatologist did not wish to do a skin biopsy stating that he did not want to create another wound area although I do not have his or her notes at this point and I do not know what diagnosis she was felt to have. She arrives in clinic today with discomfort in her feet. Exfoliating skin on the dorsal surfaces of both feet punched-out areas with necrosis especially on the  covering eschar is certainly less adherent that when I first saw her 10/22; starting the 5 mg of prednisone today. Still using Santyl, Hydrofera Blue on the open areas on her feet and ankles. We are still putting her in compression. Liberal use of triamcinolone under the compression The wounds are a lot better today many of them are healed. No new wound areas 10/28; will be finished her prednisone by the middle part of this week. Still using Santyl, Hydrofera Blue and TCA under compression to her bilateral feet. There are no open wounds. Almost all of her open areas have a clean surface except for 1 area on the right dorsal foot. She did not wish to be debrided this week rather she stated she would put that off till next week Electronic Signature(s) Signed: 05/04/2023 8:53:04 AM By: Baltazar Najjar MD [name] ([id number]) [phone].pdf Page 5 of 13 Entered By: Baltazar Najjar on 05/03/2023 08:50:14 -------------------------------------------------------------------------------- Physical Exam Details Patient Name: Date of Service: GA MMA [name] 05/03/2023 9:45 A M Medical Record Number: [id number] Patient Account Number: [id number] Date of  Birth/Sex: Treating RN: 18-Feb-1986 (37 y.o. F) Primary Care Provider: Sanda Linger Other Clinician: Referring Provider: Treating Provider/Extender: Madelyn Flavors in Treatment: 5 Constitutional Sitting or standing Blood Pressure is within target [name] for patient.. Pulse regular and within target [name] for patient.Marland Kitchen Respirations regular, non-labored and within target [name].. Temperature is normal and within the target [name] for the patient.Marland Kitchen Appears in no distress. Notes Wound exam; the major wound on the left foot is near the fourth and fifth toes dorsally. This has a clean looking surface rims of new skin. Several small areas I think are closed today on her bilateral dorsal feet. The areas on her lateral right heel also look a lot better. She still has 1 area on the left dorsal foot medially that still looks as though it needs mechanical debridement. The surrounding skin looks normal. A lot better than when she first came here Electronic Signature(s) Signed: 05/04/2023 8:53:04 AM By: Baltazar Najjar MD Entered By: Baltazar Najjar on 05/03/2023 08:51:24 -------------------------------------------------------------------------------- Physician Orders Details Patient Name: Date of Service: [name], [name]. 05/03/2023 9:45 A M Medical Record Number: [id number] Patient Account Number: [id number] Date of Birth/Sex: Treating RN: 01/21/1986 (37 y.o. 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 - Dr. Leanord Hawking - 05/10/2023 0945 Return A ppointment in 2 weeks. - Dr. Leanord Hawking - 05/17/2023 0945 Return A Return appointment in 3 weeks. - Dr. Leanord Hawking (front office to schedule) Return appointment in 1 month. - Dr. Leanord Hawking (front office to schedule) Other: - ********Start tapering off prednisone- 3 tablets a day for three days, 2 tablets a day for three days, 1 tablet a day for three days. Then at next appt may provide you with 5mg  tablets.************ ****COMPRESSION WRAPS STOP JUST BELOW THE CALVES.**** Anesthetic (In clinic) Topical Lidocaine 4% applied to wound bed Bathing/ Shower/ Hygiene May shower with protection but do not get wound dressing(s) wet. Protect  Care: Sween Lotion (Moisturizing lotion) 1 x Per Week/30 Days Discharge Instructions: Apply moisturizing lotion as directed Topical: Triamcinolone 1 x Per Week/30 Days Discharge Instructions: Apply Triamcinolone as directed liberallly Prim Dressing: Hydrofera Blue Ready Transfer Foam, 2.5x2.5 (in/in) 1 x Per Week/30 Days ary Discharge Instructions: Apply directly to wound bed as directed Prim  Dressing: Santyl Ointment 1 x Per Week/30 Days ary Discharge Instructions: Apply nickel thick amount to wound bed as instructed Secondary Dressing: ABD Pad, 8x10 1 x Per Week/30 Days Discharge Instructions: Apply over primary dressing as directed. Secondary Dressing: Woven Gauze Sponge, Non-Sterile 4x4 in 1 x Per Week/30 Days Discharge Instructions: Apply over primary dressing as directed. Com pression Wrap: Kerlix Roll 4.5x3.1 (in/yd) 1 x Per Week/30 Days Discharge Instructions: Apply Kerlix and Coban compression as directed. Com pression Wrap: Coban Self-Adherent Wrap 4x5 (in/yd) 1 x Per Week/30 Days Discharge Instructions: Apply over Kerlix as directed. 1. No new issues with the steroid withdrawal either new lesions or steroid insufficiency. 2. Almost all of her wounds are improved except for 1 area on the right dorsal foot. She agreed to allow debridement next week if that has not auto debrided. 3. Continue with Santyl, TCA Hydrofera Blue under compression. 4. She will be off steroids by the time I see her next time. 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 I have initiated the more abrupt steroid withdrawal largely because we did not have a diagnosis that would justify problems if we could not stop the prednisone Electronic Signature(s) Signed: 05/04/2023 8:53:04 AM By: Baltazar Najjar MD Entered By: Baltazar Najjar on 05/03/2023 08:53:51 [name], [name] ([id number]) [id number] Page 13 of 13 -------------------------------------------------------------------------------- SuperBill Details Patient Name: Date of Service: GA MMA [name] 05/03/2023 Medical Record Number: [id number] Patient Account Number: [id number] Date of Birth/Sex: Treating RN: 03/09/1986 (37 y.o. 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 limited to the skin Facility Procedures : CPT4 Code: 07371062 Description: 69485 - DEBRIDE W/O ANES NON SELECT Modifier: Quantity: 1 Physician Procedures : CPT4 Code Description Modifier [id number] - WC PHYS LEVEL 4 - EST PT ICD-10 Diagnosis Description 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 Still using Santyl, Hydrofera Blue on the open areas on her feet and ankles. We are still putting her in compression. Liberal use of triamcinolone under the compression The wounds are a lot better today many of them are healed. No new wound areas 10/28; will be finished her prednisone by the middle part of this week. Still using Santyl, Hydrofera Blue and TCA under compression to her bilateral feet. There are no open wounds. Almost all of her open areas have a clean surface except for 1 area on the right dorsal foot. She did not wish to be debrided this week rather she stated she would put that off till next week Electronic Signature(s) Signed: 05/04/2023 8:53:04 AM By: Baltazar Najjar MD [name] ([id number]) [phone].pdf Page 5 of 13 Entered By: Baltazar Najjar on 05/03/2023 08:50:14 -------------------------------------------------------------------------------- Physical Exam Details Patient Name: Date of Service: GA MMA [name] 05/03/2023 9:45 A M Medical Record Number: [id number] Patient Account Number: [id number] Date of  Birth/Sex: Treating RN: 18-Feb-1986 (37 y.o. F) Primary Care Provider: Sanda Linger Other Clinician: Referring Provider: Treating Provider/Extender: Madelyn Flavors in Treatment: 5 Constitutional Sitting or standing Blood Pressure is within target [name] for patient.. Pulse regular and within target [name] for patient.Marland Kitchen Respirations regular, non-labored and within target [name].. Temperature is normal and within the target [name] for the patient.Marland Kitchen Appears in no distress. Notes Wound exam; the major wound on the left foot is near the fourth and fifth toes dorsally. This has a clean looking surface rims of new skin. Several small areas I think are closed today on her bilateral dorsal feet. The areas on her lateral right heel also look a lot better. She still has 1 area on the left dorsal foot medially that still looks as though it needs mechanical debridement. The surrounding skin looks normal. A lot better than when she first came here Electronic Signature(s) Signed: 05/04/2023 8:53:04 AM By: Baltazar Najjar MD Entered By: Baltazar Najjar on 05/03/2023 08:51:24 -------------------------------------------------------------------------------- Physician Orders Details Patient Name: Date of Service: [name], [name]. 05/03/2023 9:45 A M Medical Record Number: [id number] Patient Account Number: [id number] Date of Birth/Sex: Treating RN: 01/21/1986 (37 y.o. 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 - Dr. Leanord Hawking - 05/10/2023 0945 Return A ppointment in 2 weeks. - Dr. Leanord Hawking - 05/17/2023 0945 Return A Return appointment in 3 weeks. - Dr. Leanord Hawking (front office to schedule) Return appointment in 1 month. - Dr. Leanord Hawking (front office to schedule) Other: - ********Start tapering off prednisone- 3 tablets a day for three days, 2 tablets a day for three days, 1 tablet a day for three days. Then at next appt may provide you with 5mg  tablets.************ ****COMPRESSION WRAPS STOP JUST BELOW THE CALVES.**** Anesthetic (In clinic) Topical Lidocaine 4% applied to wound bed Bathing/ Shower/ Hygiene May shower with protection but do not get wound dressing(s) wet. Protect  Coban compression as directed. Compression Wrap: Coban Self-Adherent Wrap 4x5 (in/yd) 1 x Per Week/30 Days Discharge Instructions: Apply over Kerlix as directed. Electronic Signature(s) Signed: 05/03/2023 6:15:51 PM By: Shawn Stall RN, BSN Signed: 05/04/2023 8:53:04 AM By: Baltazar Najjar MD Entered By: Shawn Stall on 05/03/2023 08:13:47 -------------------------------------------------------------------------------- Problem List Details Patient Name: Date of Service: [name], [name]. 05/03/2023 9:45 A M Medical Record Number: [id number] Patient Account Number: [id number] Date of Birth/Sex: Treating RN: 1986/05/24 (37 y.o. 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 05/03/2023 9:45 A M Medical Record Number: [id number] Patient Account Number: [id number] Date of Birth/Sex: Treating RN: [birth year] (37 y.o. F) Primary Care Provider: Sanda Linger Other Clinician: Referring Provider: Treating Provider/Extender: Madelyn Flavors in Treatment: 5 Subjective History of Present Illness (HPI) ADMISSION 03/23/2023 This is a 37 year old woman who is Story is somewhat difficult to follow however she apparently has developed bilateral lower extremity swelling involving her feet and ankles roughly 3 weeks ago. This but this became associated with small painful areas on predominantly her left greater than right foot with exfoliation of the skin. She was admitted to hospital from 03/16/2023 through 03/19/2023. X-ray of the feet was negative however she was noted to have a sedimentation rate greater than 90. Although she was empirically given antibiotics at the start of the admission. She was seen by infectious disease who did not feel this  was infectious. It was felt that she required a skin biopsy for possible vasculitis. Dermatology and rheumatology consults were suggested. She was discharged from the hospital on a gradually tapering dose of prednisone. She had cryoglobulins, complement levels measured. She did not have an active urinalysis. She has been soaking her feet in ice water and cold towels to relieve the pain. She saw her dermatologist who is in [place] yesterday for consideration of a skin biopsy. Per the patient's description the dermatologist did not wish to do a skin biopsy stating that he did not want to create another wound area although I do not have his or her notes at this point and I do not know what diagnosis she was felt to have. She arrives in clinic today with discomfort in her feet. Exfoliating skin on the dorsal surfaces of both feet punched-out areas with necrosis especially on the left dorsal foot left lateral ankle. There is also areas on the right heel. Past medical history includes acute hepatitis question involving alcohol or hepatitis A, bilateral leg edema, obesity, hypertension, history of psoriasis on [place], chronic pain, apparently some form of idiopathic peripheral neuropathy, vitamin B12 deficiency ABIs in our clinic were 1.03 on the right 1.25 on the left Addendum 03/25/2023. I received the records from Dr. Cheree Ditto at [facility] dermatology in [place]. Looking at her record she felt that the issue was secondary to lymphedema and stasis  [name], [name] ([id number]) [id number] Page 1 of 13 Visit Report for 05/03/2023 Debridement Details Patient Name: Date of Service: GA MMA [name] 05/03/2023 9:45 A M Medical Record Number: [id number] Patient Account Number: [id number] Date of Birth/Sex: Treating RN: 10/15/1985 (37 y.o. [name], [name] Primary Care Provider: Sanda Linger Other Clinician: Referring Provider: Treating Provider/Extender: Madelyn Flavors in Treatment: 5 Debridement Performed for Assessment: Wound #1 Left,Medial Foot Performed By: Clinician Shawn Stall, RN Debridement Type: Chemical/Enzymatic/Mechanical Agent Used: Santyl Level of Consciousness (Pre-procedure): Awake and Alert Pre-procedure Verification/Time Out No Taken: Percent of Wound Bed Debrided: Bleeding: None Response to Treatment: Procedure was tolerated well Level of Consciousness (Post- Awake and Alert procedure): Post Debridement Measurements of Total Wound Length: (cm) 0.5 Width: (cm) 4.6 Depth: (cm) 0.2 Volume: (cm) 0.361 Character of Wound/Ulcer Post Debridement: Requires Further Debridement Post Procedure Diagnosis Same as Pre-procedure Electronic Signature(s) Signed: 05/03/2023 6:15:51 PM By: Shawn Stall RN, BSN Signed: 05/04/2023 8:53:04 AM By: Baltazar Najjar MD Entered By: Shawn Stall on 05/03/2023 08:11:09 -------------------------------------------------------------------------------- Debridement Details Patient Name: Date of Service: [name], [name]. 05/03/2023 9:45 A M Medical Record Number: [id number] Patient Account Number: [id number] Date of Birth/Sex: Treating RN: 09/03/1985 (37 y.o. [name], [name] Primary Care Provider: Sanda Linger Other Clinician: Referring Provider: Treating Provider/Extender: Madelyn Flavors in Treatment: 5 Debridement Performed for Assessment: Wound #2 Left,Distal Foot Performed By: Clinician Shawn Stall, RN Debridement Type: Chemical/Enzymatic/Mechanical Agent Used: Santyl Level of Consciousness (Pre-procedure): Awake and Alert Pre-procedure Verification/Time Out No Taken: Percent of Wound Bed Debrided: Bleeding: None Response to Treatment: Procedure was tolerated well Level of Consciousness (Post- Awake and Alert procedure): [name], [name] ([id number]) [id number] Page 2 of 13 Post Debridement Measurements of Total Wound Length: (cm) 1.5 Width: (cm) 2 Depth: (cm) 0.1 Volume: (cm) 0.236 Character of Wound/Ulcer Post Debridement: Requires Further Debridement Post Procedure Diagnosis Same as Pre-procedure Electronic Signature(s) Signed: 05/03/2023 6:15:51 PM By: Shawn Stall RN, BSN Signed: 05/04/2023 8:53:04 AM By: Baltazar Najjar MD Entered By: Shawn Stall on 05/03/2023 08:11:26 -------------------------------------------------------------------------------- Debridement Details Patient Name: Date of Service: [name], [name]. 05/03/2023 9:45 A M Medical Record Number: [id number] Patient Account Number: [id number] Date of Birth/Sex: Treating RN: May 30, 1986 (37 y.o. [name], [name] Primary Care Provider: Sanda Linger Other Clinician: Referring Provider: Treating Provider/Extender: Madelyn Flavors in Treatment: 5 Debridement Performed for Assessment: Wound #3 Left,Proximal,Dorsal Foot Performed By: Clinician Shawn Stall, RN Debridement Type: Chemical/Enzymatic/Mechanical Agent Used: Santyl Level of Consciousness (Pre-procedure): Awake and Alert Pre-procedure Verification/Time Out No Taken: Percent of Wound Bed Debrided: Bleeding: None Response to Treatment: Procedure was tolerated well Level of Consciousness (Post- Awake and Alert procedure): Post Debridement Measurements of Total Wound Length: (cm) 5.5 Width: (cm) 5.5 Depth: (cm) 0.2 Volume: (cm) 4.752 Character of Wound/Ulcer Post Debridement:  Requires Further Debridement Post Procedure Diagnosis Same as Pre-procedure Electronic Signature(s) Signed: 05/03/2023 6:15:51 PM By: Shawn Stall RN, BSN Signed: 05/04/2023 8:53:04 AM By: Baltazar Najjar MD Entered By: Shawn Stall on 05/03/2023 08:11:52 -------------------------------------------------------------------------------- Debridement Details Patient Name: Date of Service: [name], [name]. 05/03/2023 9:45 A M Medical Record Number: [id number] Patient Account Number: [id number] Date of Birth/Sex: Treating RN: [birth year] (37 y.o. [name] Primary Care Provider: Sanda Linger Other Clinician: [name] ([id number]) [id number] Page 3 of 13 Referring Provider: Treating Provider/Extender: Madelyn Flavors in Treatment: 5 Debridement Performed for Assessment: Wound #4 Right,Dorsal Foot  covering eschar is certainly less adherent that when I first saw her 10/22; starting the 5 mg of prednisone today. Still using Santyl, Hydrofera Blue on the open areas on her feet and ankles. We are still putting her in compression. Liberal use of triamcinolone under the compression The wounds are a lot better today many of them are healed. No new wound areas 10/28; will be finished her prednisone by the middle part of this week. Still using Santyl, Hydrofera Blue and TCA under compression to her bilateral feet. There are no open wounds. Almost all of her open areas have a clean surface except for 1 area on the right dorsal foot. She did not wish to be debrided this week rather she stated she would put that off till next week Electronic Signature(s) Signed: 05/04/2023 8:53:04 AM By: Baltazar Najjar MD [name] ([id number]) [phone].pdf Page 5 of 13 Entered By: Baltazar Najjar on 05/03/2023 08:50:14 -------------------------------------------------------------------------------- Physical Exam Details Patient Name: Date of Service: [place] MMA [name] 05/03/2023 9:45 A M Medical Record Number: [id number] Patient Account Number: [id number] Date of  Birth/Sex: Treating RN: 18-Feb-1986 (37 y.o. F) Primary Care Provider: Sanda Linger Other Clinician: Referring Provider: Treating Provider/Extender: Madelyn Flavors in Treatment: 5 Constitutional Sitting or standing Blood Pressure is within target [name] for patient.. Pulse regular and within target [name] for patient.Marland Kitchen Respirations regular, non-labored and within target [name].. Temperature is normal and within the target [name] for the patient.Marland Kitchen Appears in no distress. Notes Wound exam; the major wound on the left foot is near the fourth and fifth toes dorsally. This has a clean looking surface rims of new skin. Several small areas I think are closed today on her bilateral dorsal feet. The areas on her lateral right heel also look a lot better. She still has 1 area on the left dorsal foot medially that still looks as though it needs mechanical debridement. The surrounding skin looks normal. A lot better than when she first came here Electronic Signature(s) Signed: 05/04/2023 8:53:04 AM By: Baltazar Najjar MD Entered By: Baltazar Najjar on 05/03/2023 08:51:24 -------------------------------------------------------------------------------- Physician Orders Details Patient Name: Date of Service: [name], [name]. 05/03/2023 9:45 A M Medical Record Number: [id number] Patient Account Number: [id number] Date of Birth/Sex: Treating RN: 01/21/1986 (37 y.o. 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 - Dr. Leanord Hawking - 05/10/2023 0945 Return A ppointment in 2 weeks. - Dr. Leanord Hawking - 05/17/2023 0945 Return A Return appointment in 3 weeks. - Dr. Leanord Hawking (front office to schedule) Return appointment in 1 month. - Dr. Leanord Hawking (front office to schedule) Other: - ********Start tapering off prednisone- 3 tablets a day for three days, 2 tablets a day for three days, 1 tablet a day for three days. Then at next appt may provide you with 5mg  tablets.************ ****COMPRESSION WRAPS STOP JUST BELOW THE CALVES.**** Anesthetic (In clinic) Topical Lidocaine 4% applied to wound bed Bathing/ Shower/ Hygiene May shower with protection but do not get wound dressing(s) wet. Protect

## 2023-05-06 NOTE — Progress Notes (Signed)
05/03/2023 9:45 A M Medical Record Number: [id number] Patient Account Number: [id number] Date of Birth/Sex: Treating RN: Jul 16, 1985 (37 y.o. F) Primary Care Danthony Kendrix: Sanda Linger Other Clinician: Referring Jorel Gravlin: Treating Violanda Bobeck/Extender: Madelyn Flavors in Treatment: 5 Wound Status Wound Number: 3 Primary Etiology: Lymphedema Wound Location: Left, Proximal, Dorsal Foot Wound Status: Open Wounding Event: Gradually Appeared Comorbid History: Anemia, Sleep Apnea, Vasculitis, Neuropathy Date Acquired: 03/09/2023 Weeks Of Treatment: 5 Clustered Wound: Yes Photos Wound Measurements Length: (cm) Width: (cm) Depth: (cm) Clustered Quantity: Area: (cm) Volume: (cm) 5.5 % Reduction in Area: -45.4% 5.5 % Reduction in Volume: -45.5% 0.2 Epithelialization: None 3 Tunneling: No 23.758 Undermining: No 4.752 Wound Description Classification: Partial Thickness Exudate Amount: Medium Exudate Type: Serosanguineous Exudate Color: red, brown Wound Bed Granulation Amount: Small (1-33%) Exposed Structure Granulation Quality: Pink Fascia Exposed: No Necrotic Amount: Large (67-100%) Fat Layer (Subcutaneous Tissue) Exposed: Yes Necrotic Quality: Eschar, Adherent Slough Tendon Exposed: No Muscle Exposed: No Joint Exposed: No Bone Exposed: No Periwound Skin Texture Texture  Color No Abnormalities Noted: No No Abnormalities Noted: No Callus: No Atrophie Blanche: No Crepitus: No Cyanosis: No Excoriation: No Ecchymosis: No Induration: No Erythema: No Rash: No Hemosiderin Staining: No Wahlquist, Stephie R ([id number]) [id number].pdf Page 14 of 18 Scarring: No Mottled: No Pallor: No Moisture Rubor: No No Abnormalities Noted: No Dry / Scaly: No Maceration: No Treatment Notes Wound #3 (Foot) Wound Laterality: Dorsal, Left, Proximal Cleanser Soap and Water Discharge Instruction: May shower and wash wound with dial antibacterial soap and water prior to dressing change. Vashe 5.8 (oz) Discharge Instruction: Cleanse the wound with Vashe prior to applying a clean dressing using gauze sponges, not tissue or cotton balls. Peri-Wound Care Triamcinolone 15 (g) Discharge Instruction: Use triamcinolone ****liberally*** Sween Lotion (Moisturizing lotion) Discharge Instruction: Apply moisturizing lotion as directed Topical Triamcinolone Discharge Instruction: Apply Triamcinolone as directed liberallly Primary Dressing Hydrofera Blue Ready Transfer Foam, 2.5x2.5 (in/in) Discharge Instruction: Apply directly to wound bed as directed Santyl Ointment Discharge Instruction: Apply nickel thick amount to wound bed as instructed Secondary Dressing ABD Pad, 8x10 Discharge Instruction: Apply over primary dressing as directed. Woven Gauze Sponge, Non-Sterile 4x4 in Discharge Instruction: Apply over primary dressing as directed. Secured With Compression Wrap Kerlix Roll 4.5x3.1 (in/yd) Discharge Instruction: Apply Kerlix and Coban compression as directed. Coban Self-Adherent Wrap 4x5 (in/yd) Discharge Instruction: Apply over Kerlix as directed. Compression Stockings Add-Ons Electronic Signature(s) Signed: 05/05/2023 4:40:46 PM By: Thayer Dallas Entered By: Thayer Dallas on 05/03/2023  07:28:39 -------------------------------------------------------------------------------- Wound Assessment Details Patient Name: Date of Service: [name], [name] 05/03/2023 9:45 A M Medical Record Number: [id number] Patient Account Number: [id number] Date of Birth/Sex: Treating RN: 1986-04-05 (37 y.o. F) Primary Care Pollyanna Levay: Sanda Linger Other Clinician: Referring Erique Kaser: Treating Deyjah Kindel/Extender: Madelyn Flavors in Treatment: 5 Wound Status [name] ([id number]) [id number] Page 15 of 18 Wound Number: 4 Primary Etiology: Lymphedema Wound Location: Right, Dorsal Foot Wound Status: Open Wounding Event: Gradually Appeared Comorbid History: Anemia, Sleep Apnea, Vasculitis, Neuropathy Date Acquired: 03/09/2023 Weeks Of Treatment: 5 Clustered Wound: Yes Photos Wound Measurements Length: (cm) 5.2 Width: (cm) 7.9 Depth: (cm) 0.3 Clustered Quantity: 5 Area: (cm) 32.264 Volume: (cm) 9.679 % Reduction in Area: 14.4% % Reduction in Volume: -156.7% Epithelialization: None Tunneling: No Undermining: No Wound Description Classification: Full Thickness Without Exposed Support Structures Exudate Amount: Medium Exudate Type: Serosanguineous Exudate Color: red, brown Foul Odor After Cleansing: No Slough/Fibrino Yes Wound Bed Granulation Amount: Small (1-33%) Exposed Structure Granulation  Topical Triamcinolone Discharge Instruction: Apply Triamcinolone as directed liberallly Primary Dressing Hydrofera Blue Ready Transfer Foam, 2.5x2.5 (in/in) Discharge Instruction: Apply directly to wound bed as  directed QUADIRAH, HOLDEN R ([id number]) [phone].pdf Page 11 of 18 Santyl Ointment Discharge Instruction: Apply nickel thick amount to wound bed as instructed Secondary Dressing ABD Pad, 8x10 Discharge Instruction: Apply over primary dressing as directed. Woven Gauze Sponge, Non-Sterile 4x4 in Discharge Instruction: Apply over primary dressing as directed. Secured With Compression Wrap Kerlix Roll 4.5x3.1 (in/yd) Discharge Instruction: Apply Kerlix and Coban compression as directed. Coban Self-Adherent Wrap 4x5 (in/yd) Discharge Instruction: Apply over Kerlix as directed. Compression Stockings Add-Ons Electronic Signature(s) Signed: 05/05/2023 4:40:46 PM By: Thayer Dallas Entered By: Thayer Dallas on 05/03/2023 07:27:44 -------------------------------------------------------------------------------- Wound Assessment Details Patient Name: Date of Service: [name], [name] 05/03/2023 9:45 A M Medical Record Number: [id number] Patient Account Number: [id number] Date of Birth/Sex: Treating RN: 1985-09-24 (37 y.o. F) Primary Care Ellysia Char: Sanda Linger Other Clinician: Referring Terel Bann: Treating Ulonda Klosowski/Extender: Madelyn Flavors in Treatment: 5 Wound Status Wound Number: 2 Primary Etiology: Lymphedema Wound Location: Left, Distal Foot Wound Status: Open Wounding Event: Gradually Appeared Comorbid History: Anemia, Sleep Apnea, Vasculitis, Neuropathy Date Acquired: 03/09/2023 Weeks Of Treatment: 5 Clustered Wound: Yes Photos Wound Measurements Length: (cm) Width: (cm) Depth: (cm) Clustered Quantity: Area: (cm) Volume: (cm) 1.5 % Reduction in Area: 90% 2 % Reduction in Volume: 90% 0.1 Epithelialization: None 1 Tunneling: No 2.356 Undermining: No 0.236 Wound Description Bechtel, Kimberl R ([id number]) Classification: Full Thickness Without Exposed Support Structures Exudate Amount: Medium Exudate Type:  Serosanguineous Exudate Color: red, brown [id number] Page 12 of 18 Foul Odor After Cleansing: No Slough/Fibrino Yes Wound Bed Granulation Amount: Large (67-100%) Exposed Structure Granulation Quality: Red, Pink Fascia Exposed: No Necrotic Amount: Small (1-33%) Fat Layer (Subcutaneous Tissue) Exposed: Yes Necrotic Quality: Adherent Slough Tendon Exposed: No Muscle Exposed: No Joint Exposed: No Bone Exposed: No Periwound Skin Texture Texture Color No Abnormalities Noted: No No Abnormalities Noted: No Callus: No Atrophie Blanche: No Crepitus: No Cyanosis: No Excoriation: No Ecchymosis: No Induration: No Erythema: No Rash: No Hemosiderin Staining: No Scarring: No Mottled: No Pallor: No Moisture Rubor: No No Abnormalities Noted: No Dry / Scaly: No Maceration: No Treatment Notes Wound #2 (Foot) Wound Laterality: Left, Distal Cleanser Soap and Water Discharge Instruction: May shower and wash wound with dial antibacterial soap and water prior to dressing change. Vashe 5.8 (oz) Discharge Instruction: Cleanse the wound with Vashe prior to applying a clean dressing using gauze sponges, not tissue or cotton balls. Peri-Wound Care Triamcinolone 15 (g) Discharge Instruction: Use triamcinolone ****liberally*** Sween Lotion (Moisturizing lotion) Discharge Instruction: Apply moisturizing lotion as directed Topical Triamcinolone Discharge Instruction: Apply Triamcinolone as directed liberallly Primary Dressing Hydrofera Blue Ready Transfer Foam, 2.5x2.5 (in/in) Discharge Instruction: Apply directly to wound bed as directed Santyl Ointment Discharge Instruction: Apply nickel thick amount to wound bed as instructed Secondary Dressing ABD Pad, 8x10 Discharge Instruction: Apply over primary dressing as directed. Woven Gauze Sponge, Non-Sterile 4x4 in Discharge Instruction: Apply over primary dressing as directed. Secured With Compression  Wrap Kerlix Roll 4.5x3.1 (in/yd) Discharge Instruction: Apply Kerlix and Coban compression as directed. Coban Self-Adherent Wrap 4x5 (in/yd) Discharge Instruction: Apply over Kerlix as directed. Compression Stockings Add-Ons AAHNA, PANETO R ([id number]) [id number] Page 13 of 18 Electronic Signature(s) Signed: 05/05/2023 4:40:46 PM By: Thayer Dallas Entered By: Thayer Dallas on 05/03/2023 07:28:13 -------------------------------------------------------------------------------- Wound Assessment Details Patient Name: Date of Service: [name], [name]  Quality: Red, Pink Fascia Exposed: No Necrotic Amount: Large (67-100%) Fat Layer (Subcutaneous Tissue) Exposed: Yes Necrotic Quality: Eschar, Adherent Slough Tendon Exposed: No Muscle Exposed: No Joint Exposed: No Bone Exposed: No Periwound Skin Texture Texture Color No Abnormalities Noted: No No Abnormalities Noted: No Callus: No Atrophie Blanche: No Crepitus: No Cyanosis: No Excoriation: No Ecchymosis: No Induration: No Erythema: No Rash: No Hemosiderin Staining: No Scarring: No Mottled: No Pallor:  No Moisture Rubor: No No Abnormalities Noted: No Dry / Scaly: No Maceration: No Treatment Notes Wound #4 (Foot) Wound Laterality: Dorsal, Right Cleanser Soap and Water Discharge Instruction: May shower and wash wound with dial antibacterial soap and water prior to dressing change. Vashe 5.8 (oz) Discharge Instruction: Cleanse the wound with Vashe prior to applying a clean dressing using gauze sponges, not tissue or cotton balls. Peri-Wound Care Triamcinolone 15 (g) Discharge Instruction: Use triamcinolone ****liberally*** Sween Lotion (Moisturizing lotion) Discharge Instruction: Apply moisturizing lotion as directed MAIZEY, HELIN R ([id number]) [phone].pdf Page 16 of 18 Topical Triamcinolone Discharge Instruction: Apply Triamcinolone as directed liberallly Primary Dressing Hydrofera Blue Ready Transfer Foam, 2.5x2.5 (in/in) Discharge Instruction: Apply directly to wound bed as directed Santyl Ointment Discharge Instruction: Apply nickel thick amount to wound bed as instructed Secondary Dressing ABD Pad, 8x10 Discharge Instruction: Apply over primary dressing as directed. Woven Gauze Sponge, Non-Sterile 4x4 in Discharge Instruction: Apply over primary dressing as directed. Secured With Compression Wrap Kerlix Roll 4.5x3.1 (in/yd) Discharge Instruction: Apply Kerlix and Coban compression as directed. Coban Self-Adherent Wrap 4x5 (in/yd) Discharge Instruction: Apply over Kerlix as directed. Compression Stockings Add-Ons Electronic Signature(s) Signed: 05/05/2023 4:40:46 PM By: Thayer Dallas Entered By: Thayer Dallas on 05/03/2023 07:29:44 -------------------------------------------------------------------------------- Wound Assessment Details Patient Name: Date of Service: [name], [name] 05/03/2023 9:45 A M Medical Record Number: [id number] Patient Account Number: [id number] Date of Birth/Sex: Treating RN: 1985/08/29 (37 y.o.  F) Primary Care Jodean Valade: Sanda Linger Other Clinician: Referring Karlyn Glasco: Treating Petrice Beedy/Extender: Madelyn Flavors in Treatment: 5 Wound Status Wound Number: 5 Primary Etiology: Lymphedema Wound Location: Right, Lateral Calcaneus Wound Status: Open Wounding Event: Gradually Appeared Comorbid History: Anemia, Sleep Apnea, Vasculitis, Neuropathy Date Acquired: 03/09/2023 Weeks Of Treatment: 5 Clustered Wound: No Photos Wound Measurements Gass, Allyana R ([id number]) Length: (cm) 0.4 Width: (cm) 0.5 Depth: (cm) 0.1 Area: (cm) 0.157 Volume: (cm) 0.016 [id number] Page 17 of 18 % Reduction in Area: 97.3% % Reduction in Volume: 97.3% Epithelialization: Small (1-33%) Tunneling: No Undermining: No Wound Description Classification: Full Thickness Without Exposed Sup Exudate Amount: Medium Exudate Type: Serosanguineous Exudate Color: red, brown port Structures Wound Bed Granulation Amount: Large (67-100%) Exposed Structure Granulation Quality: Red Fascia Exposed: No Necrotic Amount: Small (1-33%) Fat Layer (Subcutaneous Tissue) Exposed: Yes Necrotic Quality: Adherent Slough Tendon Exposed: No Muscle Exposed: No Joint Exposed: No Bone Exposed: No Periwound Skin Texture Texture Color No Abnormalities Noted: No No Abnormalities Noted: No Callus: No Atrophie Blanche: No Crepitus: No Cyanosis: No Excoriation: No Ecchymosis: No Induration: No Erythema: No Rash: No Hemosiderin Staining: No Scarring: No Mottled: No Pallor: No Moisture Rubor: No No Abnormalities Noted: No Dry / Scaly: No Maceration: No Treatment Notes Wound #5 (Calcaneus) Wound Laterality: Right, Lateral Cleanser Soap and Water Discharge Instruction: May shower and wash wound with dial antibacterial soap and water prior to dressing change. Vashe 5.8 (oz) Discharge Instruction: Cleanse the wound with Vashe prior to applying a clean dressing  using gauze sponges, not tissue or cotton balls. Peri-Wound Care Triamcinolone 15 (g) Discharge Instruction:  VEDA, ATCHESON R ([id number]) [id number] Page 1 of 18 Visit Report for 05/03/2023 Arrival Information Details Patient Name: Date of Service: GA MMA [name] NDRA R. 05/03/2023 9:45 A M Medical Record Number: [id number] Patient Account Number: [id number] Date of Birth/Sex: Treating RN: [birth year] (37 y.o. F) Primary Care Dereona Kolodny: Sanda Linger Other Clinician: Referring Moncerrat Burnstein: Treating Kaitlan Bin/Extender: Madelyn Flavors in Treatment: 5 Visit Information History Since Last Visit Added or deleted any medications: No Patient Arrived: Ambulatory Any new allergies or adverse reactions: No Arrival Time: 10:16 Had a fall or experienced change in No Accompanied By: mother activities of daily living that may affect Transfer Assistance: None risk of falls: Patient Identification Verified: Yes Signs or symptoms of abuse/neglect since last visito No Secondary Verification Process Completed: Yes Hospitalized since last visit: No Patient Requires Transmission-Based Precautions: No Implantable device outside of the clinic excluding No Patient Has Alerts: No cellular tissue based products placed in the center since last visit: Has Dressing in Place as Prescribed: Yes Has Compression in Place as Prescribed: No Pain Present Now: No Notes cut dressing off 05/02/23 to take a shower Electronic Signature(s) Signed: 05/05/2023 4:40:46 PM By: Thayer Dallas Entered By: Thayer Dallas on 05/03/2023 07:16:55 -------------------------------------------------------------------------------- Encounter Discharge Information Details Patient Name: Date of Service: [name], [name] 05/03/2023 9:45 A M Medical Record Number: [id number] Patient Account Number: [id number] Date of Birth/Sex: Treating RN: 29-Dec-1985 (37 y.o. [name] Primary Care Jerik Falletta: Sanda Linger Other Clinician: Referring Ryley Teater: Treating Devonda Pequignot/Extender: Madelyn Flavors in Treatment: 5 Encounter Discharge Information Items Post Procedure Vitals Discharge Condition: Stable Temperature (F): 98.4 Ambulatory Status: Ambulatory Pulse (bpm): 76 Discharge Destination: Home Respiratory Rate (breaths/min): 18 Transportation: Private Auto Blood Pressure (mmHg): 129/85 Accompanied By: MOTHER Schedule Follow-up Appointment: Yes Clinical Summary of Care: Electronic Signature(s) Signed: 05/03/2023 6:15:51 PM By: Shawn Stall RN, BSN Entered By: Shawn Stall on 05/03/2023 08:14:54 Markus Jarvis R ([id number]) [id number] Page 2 of 18 -------------------------------------------------------------------------------- Lower Extremity Assessment Details Patient Name: Date of Service: GA MMA [name] NDRA R. 05/03/2023 9:45 A M Medical Record Number: [id number] Patient Account Number: [id number] Date of Birth/Sex: Treating RN: [birth year] (37 y.o. F) Primary Care Catheleen Langhorne: Sanda Linger Other Clinician: Referring Tee Richeson: Treating Xoe Hoe/Extender: Madelyn Flavors in Treatment: 5 Edema Assessment Assessed: Kyra Searles: No] Franne Forts: No] Edema: [Left: Yes] [Right: Yes] Calf Left: Right: Point of Measurement: 39 cm From Medial Instep 47 cm 46 cm Ankle Left: Right: Point of Measurement: 9 cm From Medial Instep 25 cm 24.6 cm Vascular Assessment Extremity colors, hair growth, and conditions: Extremity Color: [Left:Hyperpigmented] [Right:Hyperpigmented] Hair Growth on Extremity: [Left:No] [Right:No] Temperature of Extremity: [Left:Warm] [Right:Warm] Capillary Refill: [Left:< 3 seconds] [Right:< 3 seconds] Dependent Rubor: [Left:No No] [Right:No No] Electronic Signature(s) Signed: 05/05/2023 4:40:46 PM By: Thayer Dallas Entered By: Thayer Dallas on 05/03/2023 07:17:38 -------------------------------------------------------------------------------- Multi Wound Chart Details Patient Name: Date  of Service: [name], [name] 05/03/2023 9:45 A M Medical Record Number: [id number] Patient Account Number: [id number] Date of Birth/Sex: Treating RN: 13-May-1986 (37 y.o. F) Primary Care Lewie Deman: Sanda Linger Other Clinician: Referring Alexas Basulto: Treating Zikeria Keough/Extender: Madelyn Flavors in Treatment: 5 Vital Signs Height(in): 69 Pulse(bpm): 76 Weight(lbs): 272 Blood Pressure(mmHg): 129/85 Body Mass Index(BMI): 40.2 Temperature(F): 98.4 Respiratory Rate(breaths/min): 18 [1:Photos:] [[id number] Page 3 of 18] Left, Medial Foot Left, Distal Foot Left, Proximal, Dorsal Foot Wound Location: Gradually Appeared Gradually Appeared Gradually Appeared Wounding Event: Lymphedema Lymphedema Lymphedema Primary Etiology: Anemia,  05/03/2023 9:45 A M Medical Record Number: [id number] Patient Account Number: [id number] Date of Birth/Sex: Treating RN: Jul 16, 1985 (37 y.o. F) Primary Care Danthony Kendrix: Sanda Linger Other Clinician: Referring Jorel Gravlin: Treating Violanda Bobeck/Extender: Madelyn Flavors in Treatment: 5 Wound Status Wound Number: 3 Primary Etiology: Lymphedema Wound Location: Left, Proximal, Dorsal Foot Wound Status: Open Wounding Event: Gradually Appeared Comorbid History: Anemia, Sleep Apnea, Vasculitis, Neuropathy Date Acquired: 03/09/2023 Weeks Of Treatment: 5 Clustered Wound: Yes Photos Wound Measurements Length: (cm) Width: (cm) Depth: (cm) Clustered Quantity: Area: (cm) Volume: (cm) 5.5 % Reduction in Area: -45.4% 5.5 % Reduction in Volume: -45.5% 0.2 Epithelialization: None 3 Tunneling: No 23.758 Undermining: No 4.752 Wound Description Classification: Partial Thickness Exudate Amount: Medium Exudate Type: Serosanguineous Exudate Color: red, brown Wound Bed Granulation Amount: Small (1-33%) Exposed Structure Granulation Quality: Pink Fascia Exposed: No Necrotic Amount: Large (67-100%) Fat Layer (Subcutaneous Tissue) Exposed: Yes Necrotic Quality: Eschar, Adherent Slough Tendon Exposed: No Muscle Exposed: No Joint Exposed: No Bone Exposed: No Periwound Skin Texture Texture  Color No Abnormalities Noted: No No Abnormalities Noted: No Callus: No Atrophie Blanche: No Crepitus: No Cyanosis: No Excoriation: No Ecchymosis: No Induration: No Erythema: No Rash: No Hemosiderin Staining: No Wahlquist, Stephie R ([id number]) [id number] Page 14 of 18 Scarring: No Mottled: No Pallor: No Moisture Rubor: No No Abnormalities Noted: No Dry / Scaly: No Maceration: No Treatment Notes Wound #3 (Foot) Wound Laterality: Dorsal, Left, Proximal Cleanser Soap and Water Discharge Instruction: May shower and wash wound with dial antibacterial soap and water prior to dressing change. Vashe 5.8 (oz) Discharge Instruction: Cleanse the wound with Vashe prior to applying a clean dressing using gauze sponges, not tissue or cotton balls. Peri-Wound Care Triamcinolone 15 (g) Discharge Instruction: Use triamcinolone ****liberally*** Sween Lotion (Moisturizing lotion) Discharge Instruction: Apply moisturizing lotion as directed Topical Triamcinolone Discharge Instruction: Apply Triamcinolone as directed liberallly Primary Dressing Hydrofera Blue Ready Transfer Foam, 2.5x2.5 (in/in) Discharge Instruction: Apply directly to wound bed as directed Santyl Ointment Discharge Instruction: Apply nickel thick amount to wound bed as instructed Secondary Dressing ABD Pad, 8x10 Discharge Instruction: Apply over primary dressing as directed. Woven Gauze Sponge, Non-Sterile 4x4 in Discharge Instruction: Apply over primary dressing as directed. Secured With Compression Wrap Kerlix Roll 4.5x3.1 (in/yd) Discharge Instruction: Apply Kerlix and Coban compression as directed. Coban Self-Adherent Wrap 4x5 (in/yd) Discharge Instruction: Apply over Kerlix as directed. Compression Stockings Add-Ons Electronic Signature(s) Signed: 05/05/2023 4:40:46 PM By: Thayer Dallas Entered By: Thayer Dallas on 05/03/2023  07:28:39 -------------------------------------------------------------------------------- Wound Assessment Details Patient Name: Date of Service: [name], [name] 05/03/2023 9:45 A M Medical Record Number: [id number] Patient Account Number: [id number] Date of Birth/Sex: Treating RN: 1986-04-05 (37 y.o. F) Primary Care Pollyanna Levay: Sanda Linger Other Clinician: Referring Erique Kaser: Treating Deyjah Kindel/Extender: Madelyn Flavors in Treatment: 5 Wound Status [name] ([id number]) [id number] Page 15 of 18 Wound Number: 4 Primary Etiology: Lymphedema Wound Location: Right, Dorsal Foot Wound Status: Open Wounding Event: Gradually Appeared Comorbid History: Anemia, Sleep Apnea, Vasculitis, Neuropathy Date Acquired: 03/09/2023 Weeks Of Treatment: 5 Clustered Wound: Yes Photos Wound Measurements Length: (cm) 5.2 Width: (cm) 7.9 Depth: (cm) 0.3 Clustered Quantity: 5 Area: (cm) 32.264 Volume: (cm) 9.679 % Reduction in Area: 14.4% % Reduction in Volume: -156.7% Epithelialization: None Tunneling: No Undermining: No Wound Description Classification: Full Thickness Without Exposed Support Structures Exudate Amount: Medium Exudate Type: Serosanguineous Exudate Color: red, brown Foul Odor After Cleansing: No Slough/Fibrino Yes Wound Bed Granulation Amount: Small (1-33%) Exposed Structure Granulation  Topical Triamcinolone Discharge Instruction: Apply Triamcinolone as directed liberallly Primary Dressing Hydrofera Blue Ready Transfer Foam, 2.5x2.5 (in/in) Discharge Instruction: Apply directly to wound bed as  directed QUADIRAH, HOLDEN R ([id number]) [phone].pdf Page 11 of 18 Santyl Ointment Discharge Instruction: Apply nickel thick amount to wound bed as instructed Secondary Dressing ABD Pad, 8x10 Discharge Instruction: Apply over primary dressing as directed. Woven Gauze Sponge, Non-Sterile 4x4 in Discharge Instruction: Apply over primary dressing as directed. Secured With Compression Wrap Kerlix Roll 4.5x3.1 (in/yd) Discharge Instruction: Apply Kerlix and Coban compression as directed. Coban Self-Adherent Wrap 4x5 (in/yd) Discharge Instruction: Apply over Kerlix as directed. Compression Stockings Add-Ons Electronic Signature(s) Signed: 05/05/2023 4:40:46 PM By: Thayer Dallas Entered By: Thayer Dallas on 05/03/2023 07:27:44 -------------------------------------------------------------------------------- Wound Assessment Details Patient Name: Date of Service: [name], [name] 05/03/2023 9:45 A M Medical Record Number: [id number] Patient Account Number: [id number] Date of Birth/Sex: Treating RN: 1985-09-24 (37 y.o. F) Primary Care Ellysia Char: Sanda Linger Other Clinician: Referring Terel Bann: Treating Ulonda Klosowski/Extender: Madelyn Flavors in Treatment: 5 Wound Status Wound Number: 2 Primary Etiology: Lymphedema Wound Location: Left, Distal Foot Wound Status: Open Wounding Event: Gradually Appeared Comorbid History: Anemia, Sleep Apnea, Vasculitis, Neuropathy Date Acquired: 03/09/2023 Weeks Of Treatment: 5 Clustered Wound: Yes Photos Wound Measurements Length: (cm) Width: (cm) Depth: (cm) Clustered Quantity: Area: (cm) Volume: (cm) 1.5 % Reduction in Area: 90% 2 % Reduction in Volume: 90% 0.1 Epithelialization: None 1 Tunneling: No 2.356 Undermining: No 0.236 Wound Description Bechtel, Kimberl R ([id number]) Classification: Full Thickness Without Exposed Support Structures Exudate Amount: Medium Exudate Type:  Serosanguineous Exudate Color: red, brown [id number] Page 12 of 18 Foul Odor After Cleansing: No Slough/Fibrino Yes Wound Bed Granulation Amount: Large (67-100%) Exposed Structure Granulation Quality: Red, Pink Fascia Exposed: No Necrotic Amount: Small (1-33%) Fat Layer (Subcutaneous Tissue) Exposed: Yes Necrotic Quality: Adherent Slough Tendon Exposed: No Muscle Exposed: No Joint Exposed: No Bone Exposed: No Periwound Skin Texture Texture Color No Abnormalities Noted: No No Abnormalities Noted: No Callus: No Atrophie Blanche: No Crepitus: No Cyanosis: No Excoriation: No Ecchymosis: No Induration: No Erythema: No Rash: No Hemosiderin Staining: No Scarring: No Mottled: No Pallor: No Moisture Rubor: No No Abnormalities Noted: No Dry / Scaly: No Maceration: No Treatment Notes Wound #2 (Foot) Wound Laterality: Left, Distal Cleanser Soap and Water Discharge Instruction: May shower and wash wound with dial antibacterial soap and water prior to dressing change. Vashe 5.8 (oz) Discharge Instruction: Cleanse the wound with Vashe prior to applying a clean dressing using gauze sponges, not tissue or cotton balls. Peri-Wound Care Triamcinolone 15 (g) Discharge Instruction: Use triamcinolone ****liberally*** Sween Lotion (Moisturizing lotion) Discharge Instruction: Apply moisturizing lotion as directed Topical Triamcinolone Discharge Instruction: Apply Triamcinolone as directed liberallly Primary Dressing Hydrofera Blue Ready Transfer Foam, 2.5x2.5 (in/in) Discharge Instruction: Apply directly to wound bed as directed Santyl Ointment Discharge Instruction: Apply nickel thick amount to wound bed as instructed Secondary Dressing ABD Pad, 8x10 Discharge Instruction: Apply over primary dressing as directed. Woven Gauze Sponge, Non-Sterile 4x4 in Discharge Instruction: Apply over primary dressing as directed. Secured With Compression  Wrap Kerlix Roll 4.5x3.1 (in/yd) Discharge Instruction: Apply Kerlix and Coban compression as directed. Coban Self-Adherent Wrap 4x5 (in/yd) Discharge Instruction: Apply over Kerlix as directed. Compression Stockings Add-Ons AAHNA, PANETO R ([id number]) [id number] Page 13 of 18 Electronic Signature(s) Signed: 05/05/2023 4:40:46 PM By: Thayer Dallas Entered By: Thayer Dallas on 05/03/2023 07:28:13 -------------------------------------------------------------------------------- Wound Assessment Details Patient Name: Date of Service: [name], [name]  05/03/2023 9:45 A M Medical Record Number: [id number] Patient Account Number: [id number] Date of Birth/Sex: Treating RN: Jul 16, 1985 (37 y.o. F) Primary Care Danthony Kendrix: Sanda Linger Other Clinician: Referring Jorel Gravlin: Treating Violanda Bobeck/Extender: Madelyn Flavors in Treatment: 5 Wound Status Wound Number: 3 Primary Etiology: Lymphedema Wound Location: Left, Proximal, Dorsal Foot Wound Status: Open Wounding Event: Gradually Appeared Comorbid History: Anemia, Sleep Apnea, Vasculitis, Neuropathy Date Acquired: 03/09/2023 Weeks Of Treatment: 5 Clustered Wound: Yes Photos Wound Measurements Length: (cm) Width: (cm) Depth: (cm) Clustered Quantity: Area: (cm) Volume: (cm) 5.5 % Reduction in Area: -45.4% 5.5 % Reduction in Volume: -45.5% 0.2 Epithelialization: None 3 Tunneling: No 23.758 Undermining: No 4.752 Wound Description Classification: Partial Thickness Exudate Amount: Medium Exudate Type: Serosanguineous Exudate Color: red, brown Wound Bed Granulation Amount: Small (1-33%) Exposed Structure Granulation Quality: Pink Fascia Exposed: No Necrotic Amount: Large (67-100%) Fat Layer (Subcutaneous Tissue) Exposed: Yes Necrotic Quality: Eschar, Adherent Slough Tendon Exposed: No Muscle Exposed: No Joint Exposed: No Bone Exposed: No Periwound Skin Texture Texture  Color No Abnormalities Noted: No No Abnormalities Noted: No Callus: No Atrophie Blanche: No Crepitus: No Cyanosis: No Excoriation: No Ecchymosis: No Induration: No Erythema: No Rash: No Hemosiderin Staining: No Wahlquist, Stephie R ([id number]) [id number] Page 14 of 18 Scarring: No Mottled: No Pallor: No Moisture Rubor: No No Abnormalities Noted: No Dry / Scaly: No Maceration: No Treatment Notes Wound #3 (Foot) Wound Laterality: Dorsal, Left, Proximal Cleanser Soap and Water Discharge Instruction: May shower and wash wound with dial antibacterial soap and water prior to dressing change. Vashe 5.8 (oz) Discharge Instruction: Cleanse the wound with Vashe prior to applying a clean dressing using gauze sponges, not tissue or cotton balls. Peri-Wound Care Triamcinolone 15 (g) Discharge Instruction: Use triamcinolone ****liberally*** Sween Lotion (Moisturizing lotion) Discharge Instruction: Apply moisturizing lotion as directed Topical Triamcinolone Discharge Instruction: Apply Triamcinolone as directed liberallly Primary Dressing Hydrofera Blue Ready Transfer Foam, 2.5x2.5 (in/in) Discharge Instruction: Apply directly to wound bed as directed Santyl Ointment Discharge Instruction: Apply nickel thick amount to wound bed as instructed Secondary Dressing ABD Pad, 8x10 Discharge Instruction: Apply over primary dressing as directed. Woven Gauze Sponge, Non-Sterile 4x4 in Discharge Instruction: Apply over primary dressing as directed. Secured With Compression Wrap Kerlix Roll 4.5x3.1 (in/yd) Discharge Instruction: Apply Kerlix and Coban compression as directed. Coban Self-Adherent Wrap 4x5 (in/yd) Discharge Instruction: Apply over Kerlix as directed. Compression Stockings Add-Ons Electronic Signature(s) Signed: 05/05/2023 4:40:46 PM By: Thayer Dallas Entered By: Thayer Dallas on 05/03/2023  07:28:39 -------------------------------------------------------------------------------- Wound Assessment Details Patient Name: Date of Service: [name], [name] 05/03/2023 9:45 A M Medical Record Number: [id number] Patient Account Number: [id number] Date of Birth/Sex: Treating RN: 1986-04-05 (37 y.o. F) Primary Care Pollyanna Levay: Sanda Linger Other Clinician: Referring Erique Kaser: Treating Deyjah Kindel/Extender: Madelyn Flavors in Treatment: 5 Wound Status [name] ([id number]) [id number] Page 15 of 18 Wound Number: 4 Primary Etiology: Lymphedema Wound Location: Right, Dorsal Foot Wound Status: Open Wounding Event: Gradually Appeared Comorbid History: Anemia, Sleep Apnea, Vasculitis, Neuropathy Date Acquired: 03/09/2023 Weeks Of Treatment: 5 Clustered Wound: Yes Photos Wound Measurements Length: (cm) 5.2 Width: (cm) 7.9 Depth: (cm) 0.3 Clustered Quantity: 5 Area: (cm) 32.264 Volume: (cm) 9.679 % Reduction in Area: 14.4% % Reduction in Volume: -156.7% Epithelialization: None Tunneling: No Undermining: No Wound Description Classification: Full Thickness Without Exposed Support Structures Exudate Amount: Medium Exudate Type: Serosanguineous Exudate Color: red, brown Foul Odor After Cleansing: No Slough/Fibrino Yes Wound Bed Granulation Amount: Small (1-33%) Exposed Structure Granulation  skin integrity Goals: Patient/caregiver will verbalize understanding of skin care regimen Date Initiated: 03/23/2023 Target Resolution Date: 06/04/2023 Goal Status: Active Interventions: Assess patient/caregiver ability to obtain necessary supplies Assess patient/caregiver ability to perform ulcer/skin care regimen upon admission and as needed Provide education on ulcer and skin care Treatment Activities: Skin care regimen initiated : 03/23/2023 Topical wound management initiated : 03/23/2023 Notes: Electronic Signature(s) Signed: 05/03/2023 6:15:51 PM By: Shawn Stall RN, BSN Entered By: Shawn Stall on 05/03/2023 08:08:18 -------------------------------------------------------------------------------- Pain Assessment Details Patient Name: Date of Service: [name], [name] 05/03/2023 9:45 A M Medical Record Number: [id number] Patient Account Number: [id number] Date of Birth/Sex: Treating RN: [birth year] (37 y.o. F) Primary Care Arham Symmonds: Sanda Linger Other Clinician: Referring Jarnell Cordaro: Treating Shanaya Schneck/Extender: Madelyn Flavors in Treatment: 5 Active Problems Location of Pain Severity and Description of Pain Patient Has Paino No Site Locations [place], [place] R ([id number]) [id number] Page 9 of 18 Pain Management and Medication Current Pain Management: Electronic Signature(s) Signed: 05/05/2023 4:40:46 PM By: Thayer Dallas Entered By: Thayer Dallas on 05/03/2023 07:17:05 -------------------------------------------------------------------------------- Patient/Caregiver Education Details Patient Name: Date of Service: [name], [name] 10/28/2024andnbsp9:45 A M Medical Record Number: [id number] Patient Account Number: [id number] Date of Birth/Gender: Treating RN: [birth year] (37 y.o. [name] Primary Care Physician: Sanda Linger Other Clinician: Referring Physician: Treating Physician/Extender: Madelyn Flavors in Treatment: 5 Education Assessment Education Provided To: Patient Education Topics Provided Wound/Skin Impairment: Handouts: Caring for Your Ulcer Methods: Explain/Verbal Responses: Reinforcements needed Electronic Signature(s) Signed: 05/03/2023 6:15:51 PM By: Shawn Stall RN, BSN Entered By: Shawn Stall on 05/03/2023 08:08:29 -------------------------------------------------------------------------------- Wound Assessment Details Patient Name: Date of Service: [name], [name] 05/03/2023 9:45 A M Medical Record Number: [id number] Patient Account Number: [id number] Date of Birth/Sex: Treating RN: February 03, 1986 (37 y.o. F) Primary Care Kamrin Spath: Sanda Linger Other Clinician: Referring Marcianna Daily: Treating Velvie Thomaston/Extender: Madelyn Flavors in Treatment: 5 Wound Status Wound Number: 1 Primary Etiology: Lymphedema Wound Location: Left, Medial Foot Wound Status: Open Wounding Event: Gradually Appeared Comorbid History: Anemia, Sleep Apnea, Vasculitis, Neuropathy Date Acquired:  03/09/2023 Weeks Of Treatment: 5 Clustered Wound: Yes Photos LEELEE, BEARDEN R ([id number]) [phone].pdf Page 10 of 18 Wound Measurements Length: (cm) 0.5 Width: (cm) 4.6 Depth: (cm) 0.2 Clustered Quantity: 3 Area: (cm) 1.806 Volume: (cm) 0.361 % Reduction in Area: 97.5% % Reduction in Volume: 94.9% Tunneling: No Undermining: No Wound Description Classification: Unclassifiable Exudate Amount: Medium Exudate Type: Serosanguineous Exudate Color: red, brown Wound Bed Granulation Amount: None Present (0%) Exposed Structure Necrotic Amount: Large (67-100%) Fascia Exposed: No Necrotic Quality: Eschar, Adherent Slough Fat Layer (Subcutaneous Tissue) Exposed: Yes Tendon Exposed: No Muscle Exposed: No Joint Exposed: No Bone Exposed: No Periwound Skin Texture Texture Color No Abnormalities Noted: No No Abnormalities Noted: No Callus: No Atrophie Blanche: No Crepitus: No Cyanosis: No Excoriation: No Ecchymosis: No Induration: No Erythema: No Rash: No Hemosiderin Staining: No Scarring: No Mottled: No Pallor: No Moisture Rubor: No No Abnormalities Noted: No Dry / Scaly: No Maceration: No Treatment Notes Wound #1 (Foot) Wound Laterality: Left, Medial Cleanser Soap and Water Discharge Instruction: May shower and wash wound with dial antibacterial soap and water prior to dressing change. Vashe 5.8 (oz) Discharge Instruction: Cleanse the wound with Vashe prior to applying a clean dressing using gauze sponges, not tissue or cotton balls. Peri-Wound Care Triamcinolone 15 (g) Discharge Instruction: Use triamcinolone ****liberally*** Sween Lotion (Moisturizing lotion) Discharge Instruction: Apply moisturizing lotion as directed  skin integrity Goals: Patient/caregiver will verbalize understanding of skin care regimen Date Initiated: 03/23/2023 Target Resolution Date: 06/04/2023 Goal Status: Active Interventions: Assess patient/caregiver ability to obtain necessary supplies Assess patient/caregiver ability to perform ulcer/skin care regimen upon admission and as needed Provide education on ulcer and skin care Treatment Activities: Skin care regimen initiated : 03/23/2023 Topical wound management initiated : 03/23/2023 Notes: Electronic Signature(s) Signed: 05/03/2023 6:15:51 PM By: Shawn Stall RN, BSN Entered By: Shawn Stall on 05/03/2023 08:08:18 -------------------------------------------------------------------------------- Pain Assessment Details Patient Name: Date of Service: [name], [name] 05/03/2023 9:45 A M Medical Record Number: [id number] Patient Account Number: [id number] Date of Birth/Sex: Treating RN: [birth year] (37 y.o. F) Primary Care Arham Symmonds: Sanda Linger Other Clinician: Referring Jarnell Cordaro: Treating Shanaya Schneck/Extender: Madelyn Flavors in Treatment: 5 Active Problems Location of Pain Severity and Description of Pain Patient Has Paino No Site Locations [place], [place] R ([id number]) [id number] Page 9 of 18 Pain Management and Medication Current Pain Management: Electronic Signature(s) Signed: 05/05/2023 4:40:46 PM By: Thayer Dallas Entered By: Thayer Dallas on 05/03/2023 07:17:05 -------------------------------------------------------------------------------- Patient/Caregiver Education Details Patient Name: Date of Service: [name], [name] 10/28/2024andnbsp9:45 A M Medical Record Number: [id number] Patient Account Number: [id number] Date of Birth/Gender: Treating RN: [birth year] (37 y.o. [name] Primary Care Physician: Sanda Linger Other Clinician: Referring Physician: Treating Physician/Extender: Madelyn Flavors in Treatment: 5 Education Assessment Education Provided To: Patient Education Topics Provided Wound/Skin Impairment: Handouts: Caring for Your Ulcer Methods: Explain/Verbal Responses: Reinforcements needed Electronic Signature(s) Signed: 05/03/2023 6:15:51 PM By: Shawn Stall RN, BSN Entered By: Shawn Stall on 05/03/2023 08:08:29 -------------------------------------------------------------------------------- Wound Assessment Details Patient Name: Date of Service: [name], [name] 05/03/2023 9:45 A M Medical Record Number: [id number] Patient Account Number: [id number] Date of Birth/Sex: Treating RN: February 03, 1986 (37 y.o. F) Primary Care Kamrin Spath: Sanda Linger Other Clinician: Referring Marcianna Daily: Treating Velvie Thomaston/Extender: Madelyn Flavors in Treatment: 5 Wound Status Wound Number: 1 Primary Etiology: Lymphedema Wound Location: Left, Medial Foot Wound Status: Open Wounding Event: Gradually Appeared Comorbid History: Anemia, Sleep Apnea, Vasculitis, Neuropathy Date Acquired:  03/09/2023 Weeks Of Treatment: 5 Clustered Wound: Yes Photos LEELEE, BEARDEN R ([id number]) [phone].pdf Page 10 of 18 Wound Measurements Length: (cm) 0.5 Width: (cm) 4.6 Depth: (cm) 0.2 Clustered Quantity: 3 Area: (cm) 1.806 Volume: (cm) 0.361 % Reduction in Area: 97.5% % Reduction in Volume: 94.9% Tunneling: No Undermining: No Wound Description Classification: Unclassifiable Exudate Amount: Medium Exudate Type: Serosanguineous Exudate Color: red, brown Wound Bed Granulation Amount: None Present (0%) Exposed Structure Necrotic Amount: Large (67-100%) Fascia Exposed: No Necrotic Quality: Eschar, Adherent Slough Fat Layer (Subcutaneous Tissue) Exposed: Yes Tendon Exposed: No Muscle Exposed: No Joint Exposed: No Bone Exposed: No Periwound Skin Texture Texture Color No Abnormalities Noted: No No Abnormalities Noted: No Callus: No Atrophie Blanche: No Crepitus: No Cyanosis: No Excoriation: No Ecchymosis: No Induration: No Erythema: No Rash: No Hemosiderin Staining: No Scarring: No Mottled: No Pallor: No Moisture Rubor: No No Abnormalities Noted: No Dry / Scaly: No Maceration: No Treatment Notes Wound #1 (Foot) Wound Laterality: Left, Medial Cleanser Soap and Water Discharge Instruction: May shower and wash wound with dial antibacterial soap and water prior to dressing change. Vashe 5.8 (oz) Discharge Instruction: Cleanse the wound with Vashe prior to applying a clean dressing using gauze sponges, not tissue or cotton balls. Peri-Wound Care Triamcinolone 15 (g) Discharge Instruction: Use triamcinolone ****liberally*** Sween Lotion (Moisturizing lotion) Discharge Instruction: Apply moisturizing lotion as directed

## 2023-05-10 ENCOUNTER — Encounter (HOSPITAL_BASED_OUTPATIENT_CLINIC_OR_DEPARTMENT_OTHER): Payer: BC Managed Care – PPO | Attending: Internal Medicine | Admitting: Internal Medicine

## 2023-05-10 DIAGNOSIS — L97528 Non-pressure chronic ulcer of other part of left foot with other specified severity: Secondary | ICD-10-CM | POA: Diagnosis not present

## 2023-05-10 DIAGNOSIS — D649 Anemia, unspecified: Secondary | ICD-10-CM | POA: Insufficient documentation

## 2023-05-10 DIAGNOSIS — E538 Deficiency of other specified B group vitamins: Secondary | ICD-10-CM | POA: Insufficient documentation

## 2023-05-10 DIAGNOSIS — I890 Lymphedema, not elsewhere classified: Secondary | ICD-10-CM | POA: Diagnosis not present

## 2023-05-10 DIAGNOSIS — I872 Venous insufficiency (chronic) (peripheral): Secondary | ICD-10-CM | POA: Diagnosis not present

## 2023-05-10 DIAGNOSIS — G4730 Sleep apnea, unspecified: Secondary | ICD-10-CM | POA: Diagnosis not present

## 2023-05-10 DIAGNOSIS — I10 Essential (primary) hypertension: Secondary | ICD-10-CM | POA: Diagnosis not present

## 2023-05-10 DIAGNOSIS — G8929 Other chronic pain: Secondary | ICD-10-CM | POA: Diagnosis not present

## 2023-05-10 DIAGNOSIS — L97518 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 severity: Secondary | ICD-10-CM | POA: Insufficient documentation

## 2023-05-11 ENCOUNTER — Other Ambulatory Visit: Payer: Self-pay | Admitting: Internal Medicine

## 2023-05-11 DIAGNOSIS — I10 Essential (primary) hypertension: Secondary | ICD-10-CM

## 2023-05-11 DIAGNOSIS — I110 Hypertensive heart disease with heart failure: Secondary | ICD-10-CM

## 2023-05-11 NOTE — Progress Notes (Signed)
[name], [name] ([id number]) [id number] Page 1 of 12 Visit Report for 05/10/2023 Debridement Details Patient Name: Date of Service: GA MMA [name] 05/10/2023 9:45 A M Medical Record Number: [id number] Patient Account Number: [id number] Date of Birth/Sex: Treating RN: [birth year] (37 y.o. [name], [name] Primary Care Provider: Sanda Linger Other Clinician: Referring Provider: Treating Provider/Extender: Madelyn Flavors in Treatment: 6 Debridement Performed for Assessment: Wound #1 Left,Medial Foot Performed By: Clinician Shawn Stall, RN Debridement Type: Chemical/Enzymatic/Mechanical Agent Used: Santyl Level of Consciousness (Pre-procedure): Awake and Alert Pre-procedure Verification/Time Out No Taken: Percent of Wound Bed Debrided: Bleeding: None Response to Treatment: Procedure was tolerated well Level of Consciousness (Post- Awake and Alert procedure): Post Debridement Measurements of Total Wound Length: (cm) 0.7 Width: (cm) 0.5 Depth: (cm) 0.1 Volume: (cm) 0.027 Character of Wound/Ulcer Post Debridement: Requires Further Debridement Post Procedure Diagnosis Same as Pre-procedure Electronic Signature(s) Signed: 05/10/2023 4:50:34 PM By: Baltazar Najjar MD Signed: 05/11/2023 12:20:53 PM By: Shawn Stall RN, BSN Entered By: Shawn Stall on 05/10/2023 10:40:19 -------------------------------------------------------------------------------- Debridement Details Patient Name: Date of Service: [name], [name]. 05/10/2023 9:45 A M Medical Record Number: [id number] Patient Account Number: [id number] Date of Birth/Sex: Treating RN: 1985-10-08 (37 y.o. [name], [name] Primary Care Provider: Sanda Linger Other Clinician: Referring Provider: Treating Provider/Extender: Madelyn Flavors in Treatment: 6 Debridement Performed for Assessment: Wound #2 Left,Distal Foot Performed By: Clinician Shawn Stall, RN Debridement Type: Chemical/Enzymatic/Mechanical Agent Used: Santyl Level of Consciousness (Pre-procedure): Awake and Alert Pre-procedure Verification/Time Out No Taken: Percent of Wound Bed Debrided: Bleeding: None Response to Treatment: Procedure was tolerated well Level of Consciousness (Post- Awake and Alert procedure): [name], [name] ([id number]) [id number] Page 2 of 12 Post Debridement Measurements of Total Wound Length: (cm) 2.2 Width: (cm) 1.5 Depth: (cm) 0.1 Volume: (cm) 0.259 Character of Wound/Ulcer Post Debridement: Requires Further Debridement Post Procedure Diagnosis Same as Pre-procedure Electronic Signature(s) Signed: 05/10/2023 4:50:34 PM By: Baltazar Najjar MD Signed: 05/11/2023 12:20:53 PM By: Shawn Stall RN, BSN Entered By: Shawn Stall on 05/10/2023 10:40:37 -------------------------------------------------------------------------------- Debridement Details Patient Name: Date of Service: [name], [name]. 05/10/2023 9:45 A M Medical Record Number: [id number] Patient Account Number: [id number] Date of Birth/Sex: Treating RN: [birth year] (37 y.o. [name], [name] Primary Care Provider: Sanda Linger Other Clinician: Referring Provider: Treating Provider/Extender: Madelyn Flavors in Treatment: 6 Debridement Performed for Assessment: Wound #3 Left,Proximal,Dorsal Foot Performed By: Clinician Shawn Stall, RN Debridement Type: Chemical/Enzymatic/Mechanical Agent Used: Santyl Level of Consciousness (Pre-procedure): Awake and Alert Pre-procedure Verification/Time Out No Taken: Percent of Wound Bed Debrided: Bleeding: None Response to Treatment: Procedure was tolerated well Level of Consciousness (Post- Awake and Alert procedure): Post Debridement Measurements of Total Wound Length: (cm) 5.3 Width: (cm) 5.5 Depth: (cm) 0.1 Volume: (cm) 2.289 Character of Wound/Ulcer Post Debridement:  Requires Further Debridement Post Procedure Diagnosis Same as Pre-procedure Electronic Signature(s) Signed: 05/10/2023 4:50:34 PM By: Baltazar Najjar MD Signed: 05/11/2023 12:20:53 PM By: Shawn Stall RN, BSN Entered By: Shawn Stall on 05/10/2023 10:41:05 -------------------------------------------------------------------------------- Debridement Details Patient Name: Date of Service: [name], [name]. 05/10/2023 9:45 A M Medical Record Number: [id number] Patient Account Number: [id number] Date of Birth/Sex: Treating RN: 22-Apr-1986 (37 y.o. F) Primary Care Provider: Sanda Linger Other Clinician: [name] ([id number]) [id number] Page 3 of 12 Referring Provider: Treating Provider/Extender: Madelyn Flavors in Treatment: 6 Debridement Performed for Assessment: Wound #4 Right,Dorsal Foot Performed By:  Physician Maxwell Caul., MD The following information was scribed by: Shawn Stall The information was scribed for: Baltazar Najjar Debridement Type: Debridement Level of Consciousness (Pre-procedure): Awake and Alert Pre-procedure Verification/Time Out Yes - 10:30 Taken: Start Time: 10:31 Pain Control: Lidocaine 4% T opical Solution Percent of Wound Bed Debrided: 25% T Area Debrided (cm): otal 4.71 Tissue and other material debrided: Viable, Non-Viable, Slough, Subcutaneous, Slough Level: Skin/Subcutaneous Tissue Debridement Description: Excisional Instrument: Curette Bleeding: Minimum Hemostasis Achieved: Pressure End Time: 10:39 Procedural Pain: 0 Post Procedural Pain: 0 Response to Treatment: Procedure was tolerated well Level of Consciousness (Post- Awake and Alert procedure): Post Debridement Measurements of Total Wound Length: (cm) 6 Width: (cm) 4 Depth: (cm) 0.2 Volume: (cm) 3.77 Character of Wound/Ulcer Post Debridement: Requires Further Debridement Post Procedure Diagnosis Same as  Pre-procedure Electronic Signature(s) Signed: 05/10/2023 4:50:34 PM By: Baltazar Najjar MD Entered By: Baltazar Najjar on 05/10/2023 10:47:47 -------------------------------------------------------------------------------- HPI Details Patient Name: Date of Service: [name], [name]. 05/10/2023 9:45 A M Medical Record Number: [id number] Patient Account Number: [id number] Date of Birth/Sex: Treating RN: 08-28-1985 (37 y.o. F) Primary Care Provider: Sanda Linger Other Clinician: Referring Provider: Treating Provider/Extender: Madelyn Flavors in Treatment: 6 History of Present Illness HPI Description: ADMISSION 03/23/2023 This is a 37 year old woman who is Story is somewhat difficult to follow however she apparently has developed bilateral lower extremity swelling involving her feet and ankles roughly 3 weeks ago. This but this became associated with small painful areas on predominantly her left greater than right foot with exfoliation of the skin. She was admitted to hospital from 03/16/2023 through 03/19/2023. X-ray of the feet was negative however she was noted to have a sedimentation rate greater than 90. Although she was empirically given antibiotics at the start of the admission. She was seen by infectious disease who did not feel this was infectious. It was felt that she required a skin biopsy for possible vasculitis. Dermatology and rheumatology consults were suggested. She was discharged from the hospital on a gradually tapering dose of prednisone. She had cryoglobulins, complement levels measured. She did not have an active urinalysis. She has been soaking her feet in ice water and cold towels to relieve the pain. She saw her dermatologist who is in [place] yesterday for consideration of a skin biopsy. Per the patient's description the dermatologist did not wish to do a skin biopsy stating that he did not want to create another wound area although I do not have his or  her notes at this point and I do not know what diagnosis she was felt to have. She arrives in clinic today with discomfort in her feet. Exfoliating skin on the dorsal surfaces of both feet punched-out areas with necrosis especially on the left dorsal foot left lateral ankle. There is also areas on the right heel. Past medical history includes acute hepatitis question involving alcohol or hepatitis A, bilateral leg edema, obesity, hypertension, history of psoriasis on BARB, SHEAR ([id number]) [phone].pdf Page 4 of 12 chronic pain, apparently some form of idiopathic peripheral neuropathy, vitamin B12 deficiency ABIs in our clinic were 1.03 on the right 1.25 on the left Addendum 03/25/2023. I received the records from Dr. Cheree Ditto at [facility] dermatology in [place]. Looking at her record she felt that the issue was secondary to lymphedema and stasis dermatitis on both legs. I am not sure whether she looked at the areas on her feet which was the real area of concern. She specifically  stated that she did not think that this was related to her psoriasis. Compression stockings were suggested. No biopsy was done 9/24; as noted above I did receive records from Dr. Cheree Ditto at [facility] dermatology and [place]. She felt the skin changes on her feet were related to lymphedema and stasis dermatitis. The patient arrived back in clinic the skin on her dorsal feet looks a lot better and she is in a lot less pain. HOWEVER she does have multiple small punched- out areas covered in black eschar. I did not biopsy this when I saw her the first time as she was going to dermatology the next day in retrospect that may not have been the right decision. 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 9/30; patient arrives in clinic with no complaints of pain at all. This is quite an improvement from the  first time I saw her. T the punched out holes on her dorsal o feet and right ankle we have been using Santyl. The eschar is loosening to the point where it might be a reasonably easy debridement. We have also been using TCA and the skin on her feet looks a lot better. She is still on prednisone 40 mg. This was started tapering 60 mg 10 mg weekly in the hospital for vasculitis. Unfortunately no tissue biopsy was done. 10/8; again no pain. The skin in her bilateral dorsal feet looks better than when she first came into the clinic. We have been wrapping her there is no real edema. She comes into the clinic with the wraps falling all the way down however I am not sure what the issue here is. As it turns out nobody is following the prednisone dosage. She went to her primary doctor he did not really address this she is currently on 30 mg of prednisone. As we do not have a diagnosis here I am going to taper the prednisone a little more aggressively and follow the condition of her legs. If she develops other lesions then we will have to think about this but right now we are keeping her on high doses of prednisone without a diagnosis I simply do not agree with this. 10/15; I have tapered her down to 10 mg of prednisone I put in for 5 mg tablets enough for 5 days. Counseled her about steroid withdrawal also the implications for rapid deterioration in her underlying status. As I mentioned previously no concrete diagnosis is available. She did not receive a biopsy. 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 She arrived in clinic today with multiple areas on her bilateral dorsal feet smaller or healed but still some areas that have a necrotic surface although the covering eschar is certainly less adherent that when I first saw her 10/22; starting the 5 mg of prednisone today. Still using Santyl, Hydrofera Blue on the open areas on her feet and ankles. We are still putting her  in compression. Liberal use of triamcinolone under the compression The wounds are a lot better today many of them are healed. No new wound areas 10/28; will be finished her prednisone by the middle part of this week. Still using Santyl, Hydrofera Blue and TCA under compression to her bilateral feet. There are no open wounds. Almost all of her open areas have a clean surface except for 1 area on the right dorsal foot. She did  not wish to be debrided this week rather she stated she would put that off till next week 11/4; everything continues to be a lot better. Several of the punched-out wounds have healed including the area on the right lateral calcaneus. We continue with Hydrofera Blue Santyl TCA widely under compression. She is not having any ill effects of stopping her prednisone she feels well. Electronic Signature(s) Signed: 05/10/2023 4:50:34 PM By: Baltazar Najjar MD Entered By: Baltazar Najjar on 05/10/2023 10:49:42 -------------------------------------------------------------------------------- Physical Exam Details Patient Name: Date of Service: [name], [name]. 05/10/2023 9:45 A M Medical Record Number: [id number] Patient Account Number: [id number] Date of Birth/Sex: Treating RN: November 01, 1985 (37 y.o. F) Primary Care Provider: Sanda Linger Other Clinician: Referring Provider: Treating Provider/Extender: Madelyn Flavors in Treatment: 6 Constitutional Patient is hypertensive.. Pulse regular and within target range for patient.Marland Kitchen Respirations regular, non-labored and within target range.. Temperature is normal and within the target range for the patient.Marland Kitchen Appears in no distress. Notes Wound exam; the major wound on the left foot dorsally near the fourth and fifth toes is much smaller. The remaining larger area on the right medial foot had some of the black eschar removed. I removed slough and some subcutaneous tissue with a #3 curette to clean up the surface of  this last major wound on the right foot. Hemostasis with direct pressure Electronic Signature(s) [name], [name] ([id number]) [id number] Page 5 of 12 Signed: 05/10/2023 4:50:34 PM By: Baltazar Najjar MD Entered By: Baltazar Najjar on 05/10/2023 10:50:40 -------------------------------------------------------------------------------- Physician Orders Details Patient Name: Date of Service: [name], [name]. 05/10/2023 9:45 A M Medical Record Number: [id number] Patient Account Number: [id number] Date of Birth/Sex: Treating RN: April 03, 1986 (37 y.o. [name] Primary Care Provider: Sanda Linger Other Clinician: Referring Provider: Treating Provider/Extender: Madelyn Flavors in Treatment: 6 The following information was scribed by: Shawn Stall The information was scribed for: Baltazar Najjar Verbal / Phone Orders: No Diagnosis Coding Follow-up Appointments ppointment in 1 week. - Dr. Leanord Hawking - 05/17/2023 0945 Return A ppointment in 2 weeks. - Dr. Leanord Hawking - 05/24/2023 0815 Return A Other: - ********Start tapering off prednisone- 3 tablets a day for three days, 2 tablets a day for three days, 1 tablet a day for three days. Then at next appt may provide you with 5mg  tablets.************ ****COMPRESSION WRAPS STOP JUST BELOW THE CALVES.**** Anesthetic (In clinic) Topical Lidocaine 4% applied to wound bed Bathing/ Shower/ Hygiene May shower with protection but do not get wound dressing(s) wet. Protect dressing(s) with water repellant cover (for example, large plastic bag) or a cast cover and may then take shower. Wound Treatment Wound #1 - Foot Wound Laterality: Left, Medial Cleanser: Soap and Water 1 x Per Week/30 Days Discharge Instructions: May shower and wash wound with dial antibacterial soap and water prior to dressing change. Cleanser: Vashe 5.8 (oz) 1 x Per Week/30 Days Discharge Instructions: Cleanse the wound with Vashe  prior to applying a clean dressing using gauze sponges, not tissue or cotton balls. Peri-Wound Care: Triamcinolone 15 (g) 1 x Per Week/30 Days Discharge Instructions: Use triamcinolone ****liberally*** Peri-Wound Care: Sween Lotion (Moisturizing lotion) 1 x Per Week/30 Days Discharge Instructions: Apply moisturizing lotion as directed Topical: Triamcinolone 1 x Per Week/30 Days Discharge Instructions: Apply Triamcinolone as directed liberallly Prim Dressing: Hydrofera Blue Ready Transfer Foam, 2.5x2.5 (in/in) 1 x Per Week/30 Days ary Discharge Instructions: Apply directly to wound bed as directed Prim Dressing: Santyl Ointment 1 x  Per Week/30 Days ary Discharge Instructions: Apply nickel thick amount to wound bed as instructed Secondary Dressing: ABD Pad, 8x10 1 x Per Week/30 Days Discharge Instructions: Apply over primary dressing as directed. Secondary Dressing: Woven Gauze Sponge, Non-Sterile 4x4 in 1 x Per Week/30 Days Discharge Instructions: Apply over primary dressing as directed. Compression Wrap: Kerlix Roll 4.5x3.1 (in/yd) 1 x Per Week/30 Days Discharge Instructions: Apply Kerlix and Coban compression as directed. Compression Wrap: Coban Self-Adherent Wrap 4x5 (in/yd) 1 x Per Week/30 Days Discharge Instructions: Apply over Kerlix as directed. Wound #2 - Foot Wound Laterality: Left, Distal Cleanser: Soap and Water 1 x Per Week/30 Days [name], [name] ([id number]) [id number].pdf Page 6 of 12 Discharge Instructions: May shower and wash wound with dial antibacterial soap and water prior to dressing change. Cleanser: Vashe 5.8 (oz) 1 x Per Week/30 Days Discharge Instructions: Cleanse the wound with Vashe prior to applying a clean dressing using gauze sponges, not tissue or cotton balls. Peri-Wound Care: Triamcinolone 15 (g) 1 x Per Week/30 Days Discharge Instructions: Use triamcinolone ****liberally*** Peri-Wound Care: Sween Lotion (Moisturizing lotion) 1 x  Per Week/30 Days Discharge Instructions: Apply moisturizing lotion as directed Topical: Triamcinolone 1 x Per Week/30 Days Discharge Instructions: Apply Triamcinolone as directed liberallly Prim Dressing: Hydrofera Blue Ready Transfer Foam, 2.5x2.5 (in/in) 1 x Per Week/30 Days ary Discharge Instructions: Apply directly to wound bed as directed Prim Dressing: Santyl Ointment 1 x Per Week/30 Days ary Discharge Instructions: Apply nickel thick amount to wound bed as instructed Secondary Dressing: ABD Pad, 8x10 1 x Per Week/30 Days Discharge Instructions: Apply over primary dressing as directed. Secondary Dressing: Woven Gauze Sponge, Non-Sterile 4x4 in 1 x Per Week/30 Days Discharge Instructions: Apply over primary dressing as directed. Compression Wrap: Kerlix Roll 4.5x3.1 (in/yd) 1 x Per Week/30 Days Discharge Instructions: Apply Kerlix and Coban compression as directed. Compression Wrap: Coban Self-Adherent Wrap 4x5 (in/yd) 1 x Per Week/30 Days Discharge Instructions: Apply over Kerlix as directed. Wound #3 - Foot Wound Laterality: Dorsal, Left, Proximal Cleanser: Soap and Water 1 x Per Week/30 Days Discharge Instructions: May shower and wash wound with dial antibacterial soap and water prior to dressing change. Cleanser: Vashe 5.8 (oz) 1 x Per Week/30 Days Discharge Instructions: Cleanse the wound with Vashe prior to applying a clean dressing using gauze sponges, not tissue or cotton balls. Peri-Wound Care: Triamcinolone 15 (g) 1 x Per Week/30 Days Discharge Instructions: Use triamcinolone ****liberally*** Peri-Wound Care: Sween Lotion (Moisturizing lotion) 1 x Per Week/30 Days Discharge Instructions: Apply moisturizing lotion as directed Topical: Triamcinolone 1 x Per Week/30 Days Discharge Instructions: Apply Triamcinolone as directed liberallly Prim Dressing: Hydrofera Blue Ready Transfer Foam, 2.5x2.5 (in/in) 1 x Per Week/30 Days ary Discharge Instructions: Apply directly to wound  bed as directed Prim Dressing: Santyl Ointment 1 x Per Week/30 Days ary Discharge Instructions: Apply nickel thick amount to wound bed as instructed Secondary Dressing: ABD Pad, 8x10 1 x Per Week/30 Days Discharge Instructions: Apply over primary dressing as directed. Secondary Dressing: Woven Gauze Sponge, Non-Sterile 4x4 in 1 x Per Week/30 Days Discharge Instructions: Apply over primary dressing as directed. Compression Wrap: Kerlix Roll 4.5x3.1 (in/yd) 1 x Per Week/30 Days Discharge Instructions: Apply Kerlix and Coban compression as directed. Compression Wrap: Coban Self-Adherent Wrap 4x5 (in/yd) 1 x Per Week/30 Days Discharge Instructions: Apply over Kerlix as directed. Wound #4 - Foot Wound Laterality: Dorsal, Right Cleanser: Soap and Water 1 x Per Week/30 Days Discharge Instructions: May shower and wash wound with dial antibacterial  soap and water prior to dressing change. Cleanser: Vashe 5.8 (oz) 1 x Per Week/30 Days Discharge Instructions: Cleanse the wound with Vashe prior to applying a clean dressing using gauze sponges, not tissue or cotton balls. Peri-Wound Care: Triamcinolone 15 (g) 1 x Per Week/30 Days Discharge Instructions: Use triamcinolone ****liberally*** Peri-Wound Care: Sween Lotion (Moisturizing lotion) 1 x Per Week/30 Days [name], [name] ([id number]) [phone].pdf Page 7 of 12 Discharge Instructions: Apply moisturizing lotion as directed Topical: Triamcinolone 1 x Per Week/30 Days Discharge Instructions: Apply Triamcinolone as directed liberallly Prim Dressing: Hydrofera Blue Ready Transfer Foam, 2.5x2.5 (in/in) 1 x Per Week/30 Days ary Discharge Instructions: Apply directly to wound bed as directed Prim Dressing: Santyl Ointment 1 x Per Week/30 Days ary Discharge Instructions: Apply nickel thick amount to wound bed as instructed Secondary Dressing: ABD Pad, 8x10 1 x Per Week/30 Days Discharge Instructions: Apply over primary  dressing as directed. Secondary Dressing: Woven Gauze Sponge, Non-Sterile 4x4 in 1 x Per Week/30 Days Discharge Instructions: Apply over primary dressing as directed. Compression Wrap: Kerlix Roll 4.5x3.1 (in/yd) 1 x Per Week/30 Days Discharge Instructions: Apply Kerlix and Coban compression as directed. Compression Wrap: Coban Self-Adherent Wrap 4x5 (in/yd) 1 x Per Week/30 Days Discharge Instructions: Apply over Kerlix as directed. Electronic Signature(s) Signed: 05/10/2023 4:50:34 PM By: Baltazar Najjar MD Signed: 05/11/2023 12:20:53 PM By: Shawn Stall RN, BSN Entered By: Shawn Stall on 05/10/2023 10:41:47 -------------------------------------------------------------------------------- Problem List Details Patient Name: Date of Service: [name], [name]. 05/10/2023 9:45 A M Medical Record Number: [id number] Patient Account Number: [id number] Date of Birth/Sex: Treating RN: [birth year] (37 y.o. 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 05/10/2023 9:45 A M Medical Record Number: [id number] Patient Account Number:  [id number] Date of Birth/Sex: Treating RN: 1986-06-19 (37 y.o. F) Primary Care Provider: Sanda Linger Other Clinician: Referring Provider: Treating Provider/Extender: Madelyn Flavors in Treatment: 6 Subjective History of Present Illness (HPI) ADMISSION 03/23/2023 This is a 37 year old woman who is Story is somewhat difficult to follow however she apparently has developed bilateral lower extremity swelling involving her feet and ankles roughly 3 weeks ago. This but this became associated with small painful areas on predominantly her left greater than right foot with exfoliation of the skin. She was admitted to hospital from 03/16/2023 through 03/19/2023. X-ray of the feet was negative however she was noted to have a sedimentation rate greater than 90. Although she was empirically given antibiotics at the start of the admission. She was seen by infectious disease who did not feel this was infectious. It was felt that she required a skin biopsy for possible vasculitis. Dermatology and rheumatology consults were suggested. She was discharged from the hospital on a gradually tapering dose of prednisone. She had cryoglobulins, complement levels measured. She did not have an active urinalysis. She has been soaking her  feet in ice water and cold towels to relieve the pain. She saw her dermatologist who is in [place] yesterday for consideration of a skin biopsy. Per the patient's description the dermatologist did not wish to do a skin biopsy stating that he did not want to create another wound area although I do not have his or her notes at this point and I do not know what diagnosis she was felt to have. She arrives in clinic today with discomfort in her feet. Exfoliating skin on the dorsal surfaces of both feet punched-out areas with necrosis especially on the left dorsal foot left lateral ankle. There is also areas on the right heel. Past medical history includes acute hepatitis  question involving alcohol or hepatitis A, bilateral leg edema, obesity, hypertension, history of psoriasis on [place], chronic pain, apparently some form of idiopathic peripheral neuropathy, vitamin B12 deficiency ABIs in our clinic were 1.03 on the right 1.25 on the left Addendum 03/25/2023. I received the records from Dr. Cheree Ditto at [facility] dermatology in [place]. Looking at her record she felt that the issue was secondary to lymphedema and stasis dermatitis on both legs. I am not sure whether she looked at the areas on her feet which was the real area of concern. She specifically stated that she did not think that this was related to her psoriasis. Compression stockings were suggested. No biopsy was done 9/24; as noted above I did receive records from Dr. Cheree Ditto at [facility] dermatology and [place]. She felt the skin changes on her feet were related to lymphedema and stasis dermatitis. The patient arrived back in clinic the skin on her dorsal feet looks a lot better and she is in a lot less pain. HOWEVER she does have multiple small punched- out areas covered in black eschar. I did not biopsy this when I saw her the first time as she was going to dermatology the next day in retrospect that may not have been the right decision. 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 9/30; patient arrives in clinic with no complaints of pain at all. This is quite an improvement from the first time I saw her. T the punched out holes on her dorsal o feet and right ankle we have been using Santyl. The eschar is loosening to the point where it might be a reasonably easy debridement. We have also been using TCA and the skin on her feet looks a lot better. She is still on prednisone 40 mg. This was started tapering 60 mg 10 mg weekly in the hospital for vasculitis. Unfortunately no tissue biopsy was done. 10/8; again no pain. The  skin in her bilateral dorsal feet looks better than when she first came into the clinic. We have been wrapping her there is no real edema. She comes into the clinic with the wraps falling all the way down however I am not sure what the issue here is. As it turns out nobody is following the prednisone dosage. She went to her primary doctor he did not really address this she is currently on 30 mg of prednisone. As we do not have a diagnosis here I am going to taper the prednisone a little more aggressively and follow the condition of her legs. If she develops other lesions then we will have to think about this but right now we are keeping her on high doses of prednisone  without a diagnosis I simply do not agree with this. 10/15; I have tapered her down to 10 mg of prednisone I put in for 5 mg tablets enough for 5 days. Counseled her about steroid withdrawal also the implications for rapid deterioration in her underlying status. As I mentioned previously no concrete diagnosis is available. She did not receive a biopsy. 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 She arrived in clinic today with multiple areas on her bilateral dorsal feet smaller or healed but still some areas that have a necrotic surface although the covering eschar is certainly less adherent that when I first saw her 10/22; starting the 5 mg of prednisone today. Still using Santyl, Hydrofera Blue on the open areas on her feet and ankles. We are still putting her in compression. Liberal use of triamcinolone under the compression The wounds are a lot better today many of them are healed. No new wound areas 10/28; will be finished her prednisone by the middle part of this week. Still using Santyl, Hydrofera Blue and TCA under compression to her bilateral feet. There are no open wounds. Almost all of her open areas have a clean surface except for 1 area on the right dorsal foot. She did not wish to be debrided this  week rather she stated she would put that off till next week 11/4; everything continues to be a lot better. Several of the punched-out wounds have healed including the area on the right lateral calcaneus. We continue with Hydrofera Blue Santyl TCA widely under compression. She is not having any ill effects of stopping her prednisone she feels well. [name], [name] ([id number]) [id number] Page 9 of 12 Objective Constitutional Patient is hypertensive.. Pulse regular and within target range for patient.Marland Kitchen Respirations regular, non-labored and within target range.. Temperature is normal and within the target range for the patient.Marland Kitchen Appears in no distress. Vitals Time Taken: 10:07 AM, Height: 69 in, Weight: 272 lbs, BMI: 40.2, Temperature: 98.7 F, Pulse: 67 bpm, Respiratory Rate: 18 breaths/min, Blood Pressure: 141/91 mmHg. General Notes: Wound exam; the major wound on the left foot dorsally near the fourth and fifth toes is much smaller. The remaining larger area on the right medial foot had some of the black eschar removed. I removed slough and some subcutaneous tissue with a #3 curette to clean up the surface of this last major wound on the right foot. Hemostasis with direct pressure Integumentary (Hair, Skin) Wound #1 status is Open. Original cause of wound was Gradually Appeared. The date acquired was: 03/09/2023. The wound has been in treatment 6 weeks. The wound is located on the Left,Medial Foot. The wound measures 0.7cm length x 0.5cm width x 0.1cm depth; 0.275cm^2 area and 0.027cm^3 volume. There is Fat Layer (Subcutaneous Tissue) exposed. There is no tunneling or undermining noted. There is a medium amount of serosanguineous drainage noted. There is large (67-100%) granulation within the wound bed. There is a small (1-33%) amount of necrotic tissue within the wound bed including Eschar and Adherent Slough. The periwound skin appearance did not exhibit: Callus,  Crepitus, Excoriation, Induration, Rash, Scarring, Dry/Scaly, Maceration, Atrophie Blanche, Cyanosis, Ecchymosis, Hemosiderin Staining, Mottled, Pallor, Rubor, Erythema. Periwound temperature was noted as No Abnormality. Wound #2 status is Open. Original cause of wound was Gradually Appeared. The date acquired was: 03/09/2023. The wound has been in treatment 6 weeks. The wound is located on the Left,Distal Foot. The wound measures 2.2cm length x 1.5cm width x 0.1cm depth; 2.592cm^2  area and 0.259cm^3 volume. There is Fat Layer (Subcutaneous Tissue) exposed. There is no tunneling or undermining noted. There is a medium amount of serosanguineous drainage noted. There is large (67-100%) red, pink granulation within the wound bed. There is no necrotic tissue within the wound bed. The periwound skin appearance did not exhibit: Callus, Crepitus, Excoriation, Induration, Rash, Scarring, Dry/Scaly, Maceration, Atrophie Blanche, Cyanosis, Ecchymosis, Hemosiderin Staining, Mottled, Pallor, Rubor, Erythema. Periwound temperature was noted as No Abnormality. Wound #3 status is Open. Original cause of wound was Gradually Appeared. The date acquired was: 03/09/2023. The wound has been in treatment 6 weeks. The wound is located on the Left,Proximal,Dorsal Foot. The wound measures 5.3cm length x 5.5cm width x 0.1cm depth; 22.894cm^2 area and 2.289cm^3 volume. There is Fat Layer (Subcutaneous Tissue) exposed. There is no tunneling or undermining noted. There is a medium amount of serosanguineous drainage noted. There is large (67-100%) pink granulation within the wound bed. There is a small (1-33%) amount of necrotic tissue within the wound bed including Eschar and Adherent Slough. The periwound skin appearance did not exhibit: Callus, Crepitus, Excoriation, Induration, Rash, Scarring, Dry/Scaly, Maceration, Atrophie Blanche, Cyanosis, Ecchymosis, Hemosiderin Staining, Mottled, Pallor, Rubor, Erythema. Wound #4 status is  Open. Original cause of wound was Gradually Appeared. The date acquired was: 03/09/2023. The wound has been in treatment 6 weeks. The wound is located on the Right,Dorsal Foot. The wound measures 6cm length x 4cm width x 0.2cm depth; 18.85cm^2 area and 3.77cm^3 volume. There is Fat Layer (Subcutaneous Tissue) exposed. There is no tunneling or undermining noted. There is a medium amount of serosanguineous drainage noted. There is medium (34-66%) red, pink granulation within the wound bed. There is a medium (34-66%) amount of necrotic tissue within the wound bed including Adherent Slough. The periwound skin appearance did not exhibit: Callus, Crepitus, Excoriation, Induration, Rash, Scarring, Dry/Scaly, Maceration, Atrophie Blanche, Cyanosis, Ecchymosis, Hemosiderin Staining, Mottled, Pallor, Rubor, Erythema. Periwound temperature was noted as No Abnormality. Wound #5 status is Healed - Epithelialized. Original cause of wound was Gradually Appeared. The date acquired was: 03/09/2023. The wound has been in treatment 6 weeks. The wound is located on the Right,Lateral Calcaneus. The wound measures 0cm length x 0cm width x 0cm depth; 0cm^2 area and 0cm^3 volume. There is Fat Layer (Subcutaneous Tissue) exposed. There is no tunneling or undermining noted. There is a medium amount of serosanguineous drainage noted. There is no granulation within the wound bed. There is a large (67-100%) amount of necrotic tissue within the wound bed. The periwound skin appearance did not exhibit: Callus, Crepitus, Excoriation, Induration, Rash, Scarring, Dry/Scaly, Maceration, Atrophie Blanche, Cyanosis, Ecchymosis, Hemosiderin Staining, Mottled, Pallor, Rubor, Erythema. Periwound temperature was noted as No Abnormality. 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 There was a Chemical/Enzymatic/Mechanical debridement performed by Shawn Stall, RN.Marland Kitchen Agent used was [employer]. There was no bleeding. The procedure was tolerated well. Post Debridement Measurements: 0.7cm length x 0.5cm width x 0.1cm depth; 0.027cm^3 volume. Character of Wound/Ulcer Post Debridement requires further debridement. Post procedure Diagnosis Wound #1: Same as Pre-Procedure Wound #2 [name], [name] ([id number]) [phone].pdf Page 10 of 12 Pre-procedure diagnosis of Wound #2 is a Lymphedema located on the Left,Distal Foot . There was a Chemical/Enzymatic/Mechanical debridement performed  by Shawn Stall, RN.Marland Kitchen Agent used was [employer]. There was no bleeding. The procedure was tolerated well. Post Debridement Measurements: 2.2cm length x 1.5cm width x 0.1cm depth; 0.259cm^3 volume. Character of Wound/Ulcer Post Debridement requires further debridement. Post procedure Diagnosis Wound #2: Same as Pre-Procedure Wound #3 Pre-procedure diagnosis of Wound #3 is a Lymphedema located on the Left,Proximal,Dorsal Foot . There was a Chemical/Enzymatic/Mechanical debridement performed by Shawn Stall, RN.Marland Kitchen Agent used was [employer]. There was no bleeding. The procedure was tolerated well. Post Debridement Measurements: 5.3cm length x 5.5cm width x 0.1cm depth; 2.289cm^3 volume. Character of Wound/Ulcer Post Debridement requires further debridement. Post procedure Diagnosis Wound #3: Same as Pre-Procedure Wound #4 Pre-procedure diagnosis of Wound #4 is a Lymphedema located on the Right,Dorsal Foot . There was a Excisional Skin/Subcutaneous Tissue Debridement with a total area of 4.71 sq cm performed by Maxwell Caul., MD. With the following instrument(s): Curette to remove Viable and Non-Viable tissue/material. Material removed includes  Subcutaneous Tissue and Slough and after achieving pain control using Lidocaine 4% T opical Solution. A time out was conducted at 10:30, prior to the start of the procedure. A Minimum amount of bleeding was controlled with Pressure. The procedure was tolerated well with a pain level of 0 throughout and a pain level of 0 following the procedure. Post Debridement Measurements: 6cm length x 4cm width x 0.2cm depth; 3.77cm^3 volume. Character of Wound/Ulcer Post Debridement requires further debridement. Post procedure Diagnosis Wound #4: Same as Pre-Procedure Plan Follow-up Appointments: Return Appointment in 1 week. - Dr. Leanord Hawking - 05/17/2023 0945 Return Appointment in 2 weeks. - Dr. Leanord Hawking - 05/24/2023 0815 Other: - ********Start tapering off prednisone- 3 tablets a day for three days, 2 tablets a day for three days, 1 tablet a day for three days. Then at next appt may provide you with 5mg  tablets.************ ****COMPRESSION WRAPS STOP JUST BELOW THE CALVES.**** Anesthetic: (In clinic) Topical Lidocaine 4% applied to wound bed Bathing/ Shower/ Hygiene: May shower with protection but do not get wound dressing(s) wet. Protect dressing(s) with water repellant cover (for example, large plastic bag) or a cast cover and may then take shower. WOUND #1: - Foot Wound Laterality: Left, Medial Cleanser: Soap and Water 1 x Per Week/30 Days Discharge Instructions: May shower and wash wound with dial antibacterial soap and water prior to dressing change. Cleanser: Vashe 5.8 (oz) 1 x Per Week/30 Days Discharge Instructions: Cleanse the wound with Vashe prior to applying a clean dressing using gauze sponges, not tissue or cotton balls. Peri-Wound Care: Triamcinolone 15 (g) 1 x Per Week/30 Days Discharge Instructions: Use triamcinolone ****liberally*** Peri-Wound Care: Sween Lotion (Moisturizing lotion) 1 x Per Week/30 Days Discharge Instructions: Apply moisturizing lotion as directed Topical: Triamcinolone 1  x Per Week/30 Days Discharge Instructions: Apply Triamcinolone as directed liberallly Prim Dressing: Hydrofera Blue Ready Transfer Foam, 2.5x2.5 (in/in) 1 x Per Week/30 Days ary Discharge Instructions: Apply directly to wound bed as directed Prim Dressing: Santyl Ointment 1 x Per Week/30 Days ary Discharge Instructions: Apply nickel thick amount to wound bed as instructed Secondary Dressing: ABD Pad, 8x10 1 x Per Week/30 Days Discharge Instructions: Apply over primary dressing as directed. Secondary Dressing: Woven Gauze Sponge, Non-Sterile 4x4 in 1 x Per Week/30 Days Discharge Instructions: Apply over primary dressing as directed. Com pression Wrap: Kerlix Roll 4.5x3.1 (in/yd) 1 x Per Week/30 Days Discharge Instructions: Apply Kerlix and Coban compression as directed. Com pression Wrap: Coban Self-Adherent Wrap 4x5 (in/yd) 1 x Per Week/30 Days Discharge Instructions: Apply  over Kerlix as directed. WOUND #2: - Foot Wound Laterality: Left, Distal Cleanser: Soap and Water 1 x Per Week/30 Days Discharge Instructions: May shower and wash wound with dial antibacterial soap and water prior to dressing change. Cleanser: Vashe 5.8 (oz) 1 x Per Week/30 Days Discharge Instructions: Cleanse the wound with Vashe prior to applying a clean dressing using gauze sponges, not tissue or cotton balls. Peri-Wound Care: Triamcinolone 15 (g) 1 x Per Week/30 Days Discharge Instructions: Use triamcinolone ****liberally*** Peri-Wound Care: Sween Lotion (Moisturizing lotion) 1 x Per Week/30 Days Discharge Instructions: Apply moisturizing lotion as directed Topical: Triamcinolone 1 x Per Week/30 Days Discharge Instructions: Apply Triamcinolone as directed liberallly Prim Dressing: Hydrofera Blue Ready Transfer Foam, 2.5x2.5 (in/in) 1 x Per Week/30 Days ary Discharge Instructions: Apply directly to wound bed as directed Prim Dressing: Santyl Ointment 1 x Per Week/30 Days ary Discharge Instructions: Apply nickel  thick amount to wound bed as instructed Secondary Dressing: ABD Pad, 8x10 1 x Per Week/30 Days Discharge Instructions: Apply over primary dressing as directed. Secondary Dressing: Woven Gauze Sponge, Non-Sterile 4x4 in 1 x Per Week/30 Days Discharge Instructions: Apply over primary dressing as directed. Com pression Wrap: Kerlix Roll 4.5x3.1 (in/yd) 1 x Per Week/30 Days Discharge Instructions: Apply Kerlix and Coban compression as directed. Com pression Wrap: Coban Self-Adherent Wrap 4x5 (in/yd) 1 x Per Week/30 Days Discharge Instructions: Apply over Kerlix as directed. WOUND #3: - Foot Wound Laterality: Dorsal, Left, Proximal Cleanser: Soap and Water 1 x Per Week/30 Days Discharge Instructions: May shower and wash wound with dial antibacterial soap and water prior to dressing change. Cleanser: Vashe 5.8 (oz) 1 x Per Week/30 Days [name], [name] ([id number]) [phone].pdf Page 11 of 12 Discharge Instructions: Cleanse the wound with Vashe prior to applying a clean dressing using gauze sponges, not tissue or cotton balls. Peri-Wound Care: Triamcinolone 15 (g) 1 x Per Week/30 Days Discharge Instructions: Use triamcinolone ****liberally*** Peri-Wound Care: Sween Lotion (Moisturizing lotion) 1 x Per Week/30 Days Discharge Instructions: Apply moisturizing lotion as directed Topical: Triamcinolone 1 x Per Week/30 Days Discharge Instructions: Apply Triamcinolone as directed liberallly Prim Dressing: Hydrofera Blue Ready Transfer Foam, 2.5x2.5 (in/in) 1 x Per Week/30 Days ary Discharge Instructions: Apply directly to wound bed as directed Prim Dressing: Santyl Ointment 1 x Per Week/30 Days ary Discharge Instructions: Apply nickel thick amount to wound bed as instructed Secondary Dressing: ABD Pad, 8x10 1 x Per Week/30 Days Discharge Instructions: Apply over primary dressing as directed. Secondary Dressing: Woven Gauze Sponge, Non-Sterile 4x4 in 1 x Per Week/30  Days Discharge Instructions: Apply over primary dressing as directed. Com pression Wrap: Kerlix Roll 4.5x3.1 (in/yd) 1 x Per Week/30 Days Discharge Instructions: Apply Kerlix and Coban compression as directed. Com pression Wrap: Coban Self-Adherent Wrap 4x5 (in/yd) 1 x Per Week/30 Days Discharge Instructions: Apply over Kerlix as directed. WOUND #4: - Foot Wound Laterality: Dorsal, Right Cleanser: Soap and Water 1 x Per Week/30 Days Discharge Instructions: May shower and wash wound with dial antibacterial soap and water prior to dressing change. Cleanser: Vashe 5.8 (oz) 1 x Per Week/30 Days Discharge Instructions: Cleanse the wound with Vashe prior to applying a clean dressing using gauze sponges, not tissue or cotton balls. Peri-Wound Care: Triamcinolone 15 (g) 1 x Per Week/30 Days Discharge Instructions: Use triamcinolone ****liberally*** Peri-Wound Care: Sween Lotion (Moisturizing lotion) 1 x Per Week/30 Days Discharge Instructions: Apply moisturizing lotion as directed Topical: Triamcinolone 1 x Per Week/30 Days Discharge Instructions: Apply Triamcinolone as directed liberallly Prim  Dressing: Hydrofera Blue Ready Transfer Foam, 2.5x2.5 (in/in) 1 x Per Week/30 Days ary Discharge Instructions: Apply directly to wound bed as directed Prim Dressing: Santyl Ointment 1 x Per Week/30 Days ary Discharge Instructions: Apply nickel thick amount to wound bed as instructed Secondary Dressing: ABD Pad, 8x10 1 x Per Week/30 Days Discharge Instructions: Apply over primary dressing as directed. Secondary Dressing: Woven Gauze Sponge, Non-Sterile 4x4 in 1 x Per Week/30 Days Discharge Instructions: Apply over primary dressing as directed. Com pression Wrap: Kerlix Roll 4.5x3.1 (in/yd) 1 x Per Week/30 Days Discharge Instructions: Apply Kerlix and Coban compression as directed. Com pression Wrap: Coban Self-Adherent Wrap 4x5 (in/yd) 1 x Per Week/30 Days Discharge Instructions: Apply over Kerlix as  directed. 1. I continued with the same dressing which is Santyl Hydrofera Blue and TCA to the skin widely on the dorsal feet 2. Everything looks dramatically better here I will likely stop the TCA next time she is here 3. She has no ill effects of stopping the prednisone including no signs of steroid withdrawal and no signs of worsening wounds on her bilateral feet. Electronic Signature(s) Signed: 05/10/2023 4:50:34 PM By: Baltazar Najjar MD Entered By: Baltazar Najjar on 05/10/2023 10:51:57 -------------------------------------------------------------------------------- SuperBill Details Patient Name: Date of Service: [name], [name]. 05/10/2023 Medical Record Number: [id number] Patient Account Number: [id number] Date of Birth/Sex: Treating RN: Jul 03, 1986 (37 y.o. 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 Facility Procedures : [name], [name] CodeKAHLYN SHIPPEY ([id number] 1 I Description: 928) [phone] 1042 - DEB SUBQ TISSUE 20 SQ CM/< CD-10 Diagnosis Description L97.518 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 severity L95.8 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

## 2023-05-14 NOTE — Progress Notes (Signed)
KYELA, [name] ([id number]) [id number] Page 1 of 18 Visit Report for 05/10/2023 Arrival Information Details Patient Name: Date of Service: [name]. 05/10/2023 9:45 A M Medical Record Number: [id number] Patient Account Number: [id number] Date of Birth/Sex: Treating RN: 08-05-1985 (37 y.o. F) Primary Care Suellen Durocher: Sanda Linger Other Clinician: Referring Fowler Antos: Treating Tyrica Afzal/Extender: Madelyn Flavors in Treatment: 6 Visit Information History Since Last Visit Added or deleted any medications: No Patient Arrived: Ambulatory Any new allergies or adverse reactions: No Arrival Time: 09:57 Had a fall or experienced change in No Accompanied By: self activities of daily living that may affect Transfer Assistance: None risk of falls: Patient Identification Verified: Yes Signs or symptoms of abuse/neglect since last visito No Secondary Verification Process Completed: Yes Hospitalized since last visit: No Patient Requires Transmission-Based Precautions: No Implantable device outside of the clinic excluding No Patient Has Alerts: No cellular tissue based products placed in the center since last visit: Has Dressing in Place as Prescribed: No Has Compression in Place as Prescribed: No Pain Present Now: No Notes takes wraps off before appointment to shower Electronic Signature(s) Signed: 05/14/2023 12:52:25 PM By: Thayer Dallas Entered By: Thayer Dallas on 05/10/2023 10:02:18 -------------------------------------------------------------------------------- Encounter Discharge Information Details Patient Name: Date of Service: [name], [name] [name]. 05/10/2023 9:45 A M Medical Record Number: [id number] Patient Account Number: [id number] Date of Birth/Sex: Treating RN: 04-10-1986 (37 y.o. [name], [name] Primary Care Deveney Bayon: Sanda Linger Other Clinician: Referring Jeanetta Alonzo: Treating Jahmya Onofrio/Extender: Madelyn Flavors in Treatment: 6 Encounter Discharge Information Items Post Procedure Vitals Discharge Condition: Stable Temperature (F): 98.7 Ambulatory Status: Ambulatory Pulse (bpm): 67 Discharge Destination: Home Respiratory Rate (breaths/min): 18 Transportation: Private Auto Blood Pressure (mmHg): 141/91 Accompanied By: self Schedule Follow-up Appointment: Yes Clinical Summary of Care: Electronic Signature(s) Signed: 05/11/2023 12:20:53 PM By: Shawn Stall RN, BSN Entered By: Shawn Stall on 05/10/2023 10:43:53 Markus Jarvis [name] ([id number]) [id number] Page 2 of 18 -------------------------------------------------------------------------------- Lower Extremity Assessment Details Patient Name: Date of Service: [name]. 05/10/2023 9:45 A M Medical Record Number: [id number] Patient Account Number: [id number] Date of Birth/Sex: Treating RN: April 01, 1986 (37 y.o. F) Primary Care Murna Backer: Sanda Linger Other Clinician: Referring Tyliek Timberman: Treating Matison Nuccio/Extender: Madelyn Flavors in Treatment: 6 Edema Assessment Assessed: Kyra Searles: No] Franne Forts: No] Edema: [Left: Yes] [Right: Yes] Calf Left: Right: Point of Measurement: 39 cm From Medial Instep 48 cm 46.2 cm Ankle Left: Right: Point of Measurement: 9 cm From Medial Instep 25.5 cm 25 cm Vascular Assessment Extremity colors, hair growth, and conditions: Extremity Color: [Left:Hyperpigmented] [Right:Hyperpigmented] Hair Growth on Extremity: [Left:No] [Right:No] Temperature of Extremity: [Left:Warm] [Right:Warm] Capillary Refill: [Left:< 3 seconds] [Right:< 3 seconds] Dependent Rubor: [Left:No No] [Right:No No] Toe Nail Assessment Left: Right: Thick: No No Discolored: No No Deformed: No No Improper Length and Hygiene: No No Electronic Signature(s) Signed: 05/14/2023 12:52:25 PM By: Thayer Dallas Entered By: Thayer Dallas on 05/10/2023  10:20:19 -------------------------------------------------------------------------------- Multi Wound Chart Details Patient Name: Date of Service: [name], [name] [name]. 05/10/2023 9:45 A M Medical Record Number: [id number] Patient Account Number: [id number] Date of Birth/Sex: Treating RN: 06-Feb-1986 (37 y.o. F) Primary Care Geraldyn Shain: Sanda Linger Other Clinician: Referring Jaidyn Kuhl: Treating Tanganika Barradas/Extender: Madelyn Flavors in Treatment: 6 Vital Signs Height(in): 69 Pulse(bpm): 67 Weight(lbs): 272 Blood Pressure(mmHg): 141/91 Body Mass Index(BMI): 40.2 Temperature(F): 98.7 Respiratory Rate(breaths/min): 18 Guinta, Sarita [name] ([id number]) [id number] Page 3 of 18 [1:Photos:] Left, Medial Foot  Left, Distal Foot Left, Proximal, Dorsal Foot Wound Location: Gradually Appeared Gradually Appeared Gradually Appeared Wounding Event: Lymphedema Lymphedema Lymphedema Primary Etiology: Anemia, Sleep Apnea, Vasculitis, Anemia, Sleep Apnea, Vasculitis, Anemia, Sleep Apnea, Vasculitis, Comorbid History: Neuropathy Neuropathy Neuropathy 03/09/2023 03/09/2023 03/09/2023 Date A cquired: 6 6 6  Weeks of Treatment: Open Open Open Wound Status: No No No Wound Recurrence: Yes Yes Yes Clustered Wound: 1 1 3  Clustered Quantity: 0.7x0.5x0.1 2.2x1.5x0.1 5.3x5.5x0.1 Measurements L x W x D (cm) 0.275 2.592 22.894 A (cm) : rea 0.027 0.259 2.289 Volume (cm) : 99.60% 89.00% -40.10% % Reduction in A rea: 99.60% 89.00% 29.90% % Reduction in Volume: Partial Thickness Full Thickness Without Exposed Partial Thickness Classification: Support Structures Medium Medium Medium Exudate A mount: Serosanguineous Serosanguineous Serosanguineous Exudate Type: red, brown red, brown red, brown Exudate Color: Large (67-100%) Large (67-100%) Large (67-100%) Granulation A mount: N/A Red, Pink Pink Granulation Quality: Small (1-33%) None Present (0%) Small  (1-33%) Necrotic A mount: Eschar, Adherent Slough N/A Eschar, Adherent Slough Necrotic Tissue: Fat Layer (Subcutaneous Tissue): Yes Fat Layer (Subcutaneous Tissue): Yes Fat Layer (Subcutaneous Tissue): Yes Exposed Structures: Fascia: No Fascia: No Fascia: No Tendon: No Tendon: No Tendon: No Muscle: No Muscle: No Muscle: No Joint: No Joint: No Joint: No Bone: No Bone: No Bone: No Small (1-33%) Medium (34-66%) None Epithelialization: Chemical/Enzymatic/Mechanical Chemical/Enzymatic/Mechanical Chemical/Enzymatic/Mechanical Debridement: N/A N/A N/A Pain Control: N/A N/A N/A Tissue Debrided: N/A N/A N/A Level: N/A N/A N/A Debridement A (sq cm): rea N/A N/A N/A Instrument: None None None Bleeding: N/A N/A N/A Hemostasis A chieved: N/A N/A N/A Procedural Pain: N/A N/A N/A Post Procedural Pain: Debridement Treatment Response: Procedure was tolerated well Procedure was tolerated well Procedure was tolerated well Post Debridement Measurements L x 0.7x0.5x0.1 2.2x1.5x0.1 5.3x5.5x0.1 W x D (cm) 0.027 0.259 2.289 Post Debridement Volume: (cm) Excoriation: No Excoriation: No Excoriation: No Periwound Skin Texture: Induration: No Induration: No Induration: No Callus: No Callus: No Callus: No Crepitus: No Crepitus: No Crepitus: No Rash: No Rash: No Rash: No Scarring: No Scarring: No Scarring: No Maceration: No Maceration: No Maceration: No Periwound Skin Moisture: Dry/Scaly: No Dry/Scaly: No Dry/Scaly: No Atrophie Blanche: No Atrophie Blanche: No Atrophie Blanche: No Periwound Skin Color: Cyanosis: No Cyanosis: No Cyanosis: No Ecchymosis: No Ecchymosis: No Ecchymosis: No Erythema: No Erythema: No Erythema: No Hemosiderin Staining: No Hemosiderin Staining: No Hemosiderin Staining: No Mottled: No Mottled: No Mottled: No Pallor: No Pallor: No Pallor: No Rubor: No Rubor: No Rubor: No No Abnormality No Abnormality  N/A Temperature: Debridement Debridement Debridement Procedures Performed: Wound Number: 4 [id number] N/A Photos: N/A [name] ([id number]) [id number] Page 4 of 18 Right, Dorsal Foot Right, Lateral Calcaneus N/A Wound Location: Gradually Appeared Gradually Appeared N/A Wounding Event: Lymphedema Lymphedema N/A Primary Etiology: Anemia, Sleep Apnea, Vasculitis, Anemia, Sleep Apnea, Vasculitis, N/A Comorbid History: Neuropathy Neuropathy 03/09/2023 03/09/2023 N/A Date Acquired: 6 6 N/A Weeks of Treatment: Open Healed - Epithelialized N/A Wound Status: No No N/A Wound Recurrence: Yes No N/A Clustered Wound: 5 N/A N/A Clustered Quantity: 6x4x0.2 0x0x0 N/A Measurements L x W x D (cm) 18.85 0 N/A A (cm) : rea 3.77 0 N/A Volume (cm) : 50.00% 100.00% N/A % Reduction in A rea: 0.00% 100.00% N/A % Reduction in Volume: Full Thickness Without Exposed Full Thickness Without Exposed N/A Classification: Support Structures Support Structures Medium Medium N/A Exudate A mount: Serosanguineous Serosanguineous N/A Exudate Type: red, brown red, brown N/A Exudate Color: Medium (34-66%) None Present (0%) N/A Granulation A mount: Red, Pink N/A  N/A Granulation Quality: Medium (34-66%) Large (67-100%) N/A Necrotic A mount: Adherent Slough N/A N/A Necrotic Tissue: Fat Layer (Subcutaneous Tissue): Yes Fat Layer (Subcutaneous Tissue): Yes N/A Exposed Structures: Fascia: No Fascia: No Tendon: No Tendon: No Muscle: No Muscle: No Joint: No Joint: No Bone: No Bone: No Small (1-33%) Small (1-33%) N/A Epithelialization: Debridement - Selective/Open Wound N/A N/A Debridement: Pre-procedure Verification/Time Out 10:30 N/A N/A Taken: Lidocaine 4% Topical Solution N/A N/A Pain Control: Slough N/A N/A Tissue Debrided: Non-Viable Tissue N/A N/A Level: 4.71 N/A N/A Debridement A (sq cm): rea Curette N/A N/A Instrument: Minimum N/A  N/A Bleeding: Pressure N/A N/A Hemostasis A chieved: 0 N/A N/A Procedural Pain: 0 N/A N/A Post Procedural Pain: Procedure was tolerated well N/A N/A Debridement Treatment Response: 6x4x0.2 N/A N/A Post Debridement Measurements L x W x D (cm) 3.77 N/A N/A Post Debridement Volume: (cm) Excoriation: No Excoriation: No N/A Periwound Skin Texture: Induration: No Induration: No Callus: No Callus: No Crepitus: No Crepitus: No Rash: No Rash: No Scarring: No Scarring: No Maceration: No Maceration: No N/A Periwound Skin Moisture: Dry/Scaly: No Dry/Scaly: No Atrophie Blanche: No Atrophie Blanche: No N/A Periwound Skin Color: Cyanosis: No Cyanosis: No Ecchymosis: No Ecchymosis: No Erythema: No Erythema: No Hemosiderin Staining: No Hemosiderin Staining: No Mottled: No Mottled: No Pallor: No Pallor: No Rubor: No Rubor: No No Abnormality No Abnormality N/A Temperature: Debridement N/A N/A Procedures Performed: Treatment Notes Wound #1 (Foot) Wound Laterality: Left, Medial Cleanser Soap and Water Discharge Instruction: May shower and wash wound with dial antibacterial soap and water prior to dressing change. Vashe 5.8 (oz) Discharge Instruction: Cleanse the wound with Vashe prior to applying a clean dressing using gauze sponges, not tissue or cotton balls. Peri-Wound Care Triamcinolone 15 (g) Discharge Instruction: Use triamcinolone ****liberallyALOHA, KNIERIEM [name] ([id number]) [id number] Page 5 of 18 Sween Lotion (Moisturizing lotion) Discharge Instruction: Apply moisturizing lotion as directed Topical Triamcinolone Discharge Instruction: Apply Triamcinolone as directed liberallly Primary Dressing Hydrofera Blue Ready Transfer Foam, 2.5x2.5 (in/in) Discharge Instruction: Apply directly to wound bed as directed Santyl Ointment Discharge Instruction: Apply nickel thick amount to wound bed as instructed Secondary Dressing ABD Pad,  8x10 Discharge Instruction: Apply over primary dressing as directed. Woven Gauze Sponge, Non-Sterile 4x4 in Discharge Instruction: Apply over primary dressing as directed. Secured With Compression Wrap Kerlix Roll 4.5x3.1 (in/yd) Discharge Instruction: Apply Kerlix and Coban compression as directed. Coban Self-Adherent Wrap 4x5 (in/yd) Discharge Instruction: Apply over Kerlix as directed. Compression Stockings Add-Ons Wound #2 (Foot) Wound Laterality: Left, Distal Cleanser Soap and Water Discharge Instruction: May shower and wash wound with dial antibacterial soap and water prior to dressing change. Vashe 5.8 (oz) Discharge Instruction: Cleanse the wound with Vashe prior to applying a clean dressing using gauze sponges, not tissue or cotton balls. Peri-Wound Care Triamcinolone 15 (g) Discharge Instruction: Use triamcinolone ****liberally*** Sween Lotion (Moisturizing lotion) Discharge Instruction: Apply moisturizing lotion as directed Topical Triamcinolone Discharge Instruction: Apply Triamcinolone as directed liberallly Primary Dressing Hydrofera Blue Ready Transfer Foam, 2.5x2.5 (in/in) Discharge Instruction: Apply directly to wound bed as directed Santyl Ointment Discharge Instruction: Apply nickel thick amount to wound bed as instructed Secondary Dressing ABD Pad, 8x10 Discharge Instruction: Apply over primary dressing as directed. Woven Gauze Sponge, Non-Sterile 4x4 in Discharge Instruction: Apply over primary dressing as directed. Secured With Compression Wrap Kerlix Roll 4.5x3.1 (in/yd) Discharge Instruction: Apply Kerlix and Coban compression as directed. Coban Self-Adherent Wrap 4x5 (in/yd) Discharge Instruction: Apply over Kerlix as directed. Compression Stockings Add-Ons [place]ab[place], Belladonna [name] (  [id number]) [id number] Page 6 of 18 Wound #3 (Foot) Wound Laterality: Dorsal, Left, Proximal Cleanser Soap and Water Discharge Instruction: May  shower and wash wound with dial antibacterial soap and water prior to dressing change. Vashe 5.8 (oz) Discharge Instruction: Cleanse the wound with Vashe prior to applying a clean dressing using gauze sponges, not tissue or cotton balls. Peri-Wound Care Triamcinolone 15 (g) Discharge Instruction: Use triamcinolone ****liberally*** Sween Lotion (Moisturizing lotion) Discharge Instruction: Apply moisturizing lotion as directed Topical Triamcinolone Discharge Instruction: Apply Triamcinolone as directed liberallly Primary Dressing Hydrofera Blue Ready Transfer Foam, 2.5x2.5 (in/in) Discharge Instruction: Apply directly to wound bed as directed Santyl Ointment Discharge Instruction: Apply nickel thick amount to wound bed as instructed Secondary Dressing ABD Pad, 8x10 Discharge Instruction: Apply over primary dressing as directed. Woven Gauze Sponge, Non-Sterile 4x4 in Discharge Instruction: Apply over primary dressing as directed. Secured With Compression Wrap Kerlix Roll 4.5x3.1 (in/yd) Discharge Instruction: Apply Kerlix and Coban compression as directed. Coban Self-Adherent Wrap 4x5 (in/yd) Discharge Instruction: Apply over Kerlix as directed. Compression Stockings Add-Ons Wound #4 (Foot) Wound Laterality: Dorsal, Right Cleanser Soap and Water Discharge Instruction: May shower and wash wound with dial antibacterial soap and water prior to dressing change. Vashe 5.8 (oz) Discharge Instruction: Cleanse the wound with Vashe prior to applying a clean dressing using gauze sponges, not tissue or cotton balls. Peri-Wound Care Triamcinolone 15 (g) Discharge Instruction: Use triamcinolone ****liberally*** Sween Lotion (Moisturizing lotion) Discharge Instruction: Apply moisturizing lotion as directed Topical Triamcinolone Discharge Instruction: Apply Triamcinolone as directed liberallly Primary Dressing Hydrofera Blue Ready Transfer Foam, 2.5x2.5 (in/in) Discharge Instruction: Apply  directly to wound bed as directed Santyl Ointment Discharge Instruction: Apply nickel thick amount to wound bed as instructed Secondary Dressing ABD Pad, 8x10 Discharge Instruction: Apply over primary dressing as directed. Woven Gauze Sponge, Non-Sterile 4x4 in [place], [place] Dragon [name] ([id number]) [phone].pdf Page 7 of 18 Discharge Instruction: Apply over primary dressing as directed. Secured With Compression Wrap Kerlix Roll 4.5x3.1 (in/yd) Discharge Instruction: Apply Kerlix and Coban compression as directed. Coban Self-Adherent Wrap 4x5 (in/yd) Discharge Instruction: Apply over Kerlix as directed. Compression Stockings Add-Ons Wound #5 (Calcaneus) Wound Laterality: Right, Lateral Cleanser Peri-Wound Care Topical Primary Dressing Secondary Dressing Secured With Compression Wrap Compression Stockings Add-Ons Electronic Signature(s) Signed: 05/10/2023 4:50:34 PM By: Baltazar Najjar MD Entered By: Baltazar Najjar on 05/10/2023 10:46:43 -------------------------------------------------------------------------------- Multi-Disciplinary Care Plan Details Patient Name: Date of Service: [name], [name] [name]. 05/10/2023 9:45 A M Medical Record Number: [id number] Patient Account Number: [id number] Date of Birth/Sex: Treating RN: 08-05-1985 (37 y.o. [name], [name] Primary Care Tytianna Greenley: Sanda Linger Other Clinician: Referring Krishika Bugge: Treating Cj Beecher/Extender: Madelyn Flavors in Treatment: 6 Active Inactive Pain, Acute or Chronic Nursing Diagnoses: Pain, acute or chronic: actual or potential Potential alteration in comfort, pain Goals: Patient will verbalize adequate pain control and receive pain control interventions during procedures as needed Date Initiated: 03/23/2023 Target Resolution Date: 06/04/2023 Goal Status: Active Patient/caregiver will verbalize comfort level met Date Initiated: 03/23/2023 Date Inactivated:  04/13/2023 Target Resolution Date: 04/13/2023 Goal Status: Met Interventions: Encourage patient to take pain medications as prescribed Provide education on pain management JANIT, LATTIN ([id number]) [phone].pdf Page 8 of 18 Provision of support: recognize patient pain, provide comfort and support as needed Treatment Activities: Administer pain control measures as ordered : 03/23/2023 Notes: Wound/Skin Impairment Nursing Diagnoses: Knowledge deficit related to ulceration/compromised skin integrity Goals: Patient/caregiver will verbalize understanding of skin care regimen Date Initiated: 03/23/2023 Target Resolution Date: 06/04/2023 Goal Status:  Active Interventions: Assess patient/caregiver ability to obtain necessary supplies Assess patient/caregiver ability to perform ulcer/skin care regimen upon admission and as needed Provide education on ulcer and skin care Treatment Activities: Skin care regimen initiated : 03/23/2023 Topical wound management initiated : 03/23/2023 Notes: Electronic Signature(s) Signed: 05/11/2023 12:20:53 PM By: Shawn Stall RN, BSN Entered By: Shawn Stall on 05/10/2023 10:36:46 -------------------------------------------------------------------------------- Pain Assessment Details Patient Name: Date of Service: [name], [name] [name]. 05/10/2023 9:45 A M Medical Record Number: [id number] Patient Account Number: [id number] Date of Birth/Sex: Treating RN: Feb 12, 1986 (37 y.o. F) Primary Care Sheryle Vice: Sanda Linger Other Clinician: Referring Aavya Shafer: Treating Pastor Sgro/Extender: Madelyn Flavors in Treatment: 6 Active Problems Location of Pain Severity and Description of Pain Patient Has Paino No Site Locations Pain Management and Medication Current Pain Management: ATAVIA, VANBELLE ([id number]) [phone].pdf Page 9 of 18 Electronic Signature(s) Signed: 05/14/2023 12:52:25 PM By:  Thayer Dallas Entered By: Thayer Dallas on 05/10/2023 10:02:27 -------------------------------------------------------------------------------- Patient/Caregiver Education Details Patient Name: Date of Service: [name], [name] [name]. 11/4/2024andnbsp9:45 A M Medical Record Number: [id number] Patient Account Number: [id number] Date of Birth/Gender: Treating RN: Dec 15, 1985 (37 y.o. [name] Primary Care Physician: Sanda Linger Other Clinician: Referring Physician: Treating Physician/Extender: Madelyn Flavors in Treatment: 6 Education Assessment Education Provided To: Patient Education Topics Provided Wound/Skin Impairment: Handouts: Caring for Your Ulcer Methods: Explain/Verbal Responses: Reinforcements needed Electronic Signature(s) Signed: 05/11/2023 12:20:53 PM By: Shawn Stall RN, BSN Entered By: Shawn Stall on 05/10/2023 10:37:02 -------------------------------------------------------------------------------- Wound Assessment Details Patient Name: Date of Service: [name], [name] [name]. 05/10/2023 9:45 A M Medical Record Number: [id number] Patient Account Number: [id number] Date of Birth/Sex: Treating RN: 12/07/1985 (37 y.o. F) Primary Care Cieara Stierwalt: Sanda Linger Other Clinician: Referring Skylene Deremer: Treating Roe Wilner/Extender: Madelyn Flavors in Treatment: 6 Wound Status Wound Number: 1 Primary Etiology: Lymphedema Wound Location: Left, Medial Foot Wound Status: Open Wounding Event: Gradually Appeared Comorbid History: Anemia, Sleep Apnea, Vasculitis, Neuropathy Date Acquired: 03/09/2023 Weeks Of Treatment: 6 Clustered Wound: Yes Photos ROSHUNDRA, [name] ([id number]) [phone].pdf Page 10 of 18 Wound Measurements Length: (cm) Width: (cm) Depth: (cm) Clustered Quantity: Area: (cm) Volume: (cm) 0.7 % Reduction in Area: 99.6% 0.5 % Reduction in Volume: 99.6% 0.1 Epithelialization: Small  (1-33%) 1 Tunneling: No 0.275 Undermining: No 0.027 Wound Description Classification: Partial Thickness Exudate Amount: Medium Exudate Type: Serosanguineous Exudate Color: red, brown Foul Odor After Cleansing: No Slough/Fibrino Yes Wound Bed Granulation Amount: Large (67-100%) Exposed Structure Necrotic Amount: Small (1-33%) Fascia Exposed: No Necrotic Quality: Eschar, Adherent Slough Fat Layer (Subcutaneous Tissue) Exposed: Yes Tendon Exposed: No Muscle Exposed: No Joint Exposed: No Bone Exposed: No Periwound Skin Texture Texture Color No Abnormalities Noted: No No Abnormalities Noted: No Callus: No Atrophie Blanche: No Crepitus: No Cyanosis: No Excoriation: No Ecchymosis: No Induration: No Erythema: No Rash: No Hemosiderin Staining: No Scarring: No Mottled: No Pallor: No Moisture Rubor: No No Abnormalities Noted: No Dry / Scaly: No Temperature / Pain Maceration: No Temperature: No Abnormality Treatment Notes Wound #1 (Foot) Wound Laterality: Left, Medial Cleanser Soap and Water Discharge Instruction: May shower and wash wound with dial antibacterial soap and water prior to dressing change. Vashe 5.8 (oz) Discharge Instruction: Cleanse the wound with Vashe prior to applying a clean dressing using gauze sponges, not tissue or cotton balls. Peri-Wound Care Triamcinolone 15 (g) Discharge Instruction: Use triamcinolone ****liberally*** Sween Lotion (Moisturizing lotion) Discharge Instruction: Apply moisturizing lotion as directed Topical Triamcinolone Discharge Instruction:  Apply Triamcinolone as directed liberallly Primary Dressing Hydrofera Blue Ready Transfer Foam, 2.5x2.5 (in/in) Discharge Instruction: Apply directly to wound bed as directed BECKLEY, PRZYBYLSKI [name] ([id number]) [phone].pdf Page 11 of 18 Santyl Ointment Discharge Instruction: Apply nickel thick amount to wound bed as instructed Secondary Dressing ABD Pad,  8x10 Discharge Instruction: Apply over primary dressing as directed. Woven Gauze Sponge, Non-Sterile 4x4 in Discharge Instruction: Apply over primary dressing as directed. Secured With Compression Wrap Kerlix Roll 4.5x3.1 (in/yd) Discharge Instruction: Apply Kerlix and Coban compression as directed. Coban Self-Adherent Wrap 4x5 (in/yd) Discharge Instruction: Apply over Kerlix as directed. Compression Stockings Add-Ons Electronic Signature(s) Signed: 05/14/2023 12:52:25 PM By: Thayer Dallas Entered By: Thayer Dallas on 05/10/2023 10:15:52 -------------------------------------------------------------------------------- Wound Assessment Details Patient Name: Date of Service: [name], [name] [name]. 05/10/2023 9:45 A M Medical Record Number: [id number] Patient Account Number: [id number] Date of Birth/Sex: Treating RN: 1985-12-05 (37 y.o. F) Primary Care [name]: Sanda Linger Other Clinician: Referring Tomasita Beevers: Treating Annalynne Ibanez/Extender: Madelyn Flavors in Treatment: 6 Wound Status Wound Number: 2 Primary Etiology: Lymphedema Wound Location: Left, Distal Foot Wound Status: Open Wounding Event: Gradually Appeared Comorbid History: Anemia, Sleep Apnea, Vasculitis, Neuropathy Date Acquired: 03/09/2023 Weeks Of Treatment: 6 Clustered Wound: Yes Photos Wound Measurements Length: (cm) Width: (cm) Depth: (cm) Clustered Quantity: Area: (cm) Volume: (cm) 2.2 % Reduction in Area: 89% 1.5 % Reduction in Volume: 89% 0.1 Epithelialization: Medium (34-66%) 1 Tunneling: No 2.592 Undermining: No 0.259 Wound Description Yakubov, [name] ([id number]) Classification: Full Thickness Without Exposed Support Structures Exudate Amount: Medium Exudate Type: Serosanguineous Exudate Color: red, brown [id number].pdf Page 12 of 18 Foul Odor After Cleansing: No Slough/Fibrino Yes Wound Bed Granulation Amount: Large (67-100%) Exposed  Structure Granulation Quality: Red, Pink Fascia Exposed: No Necrotic Amount: None Present (0%) Fat Layer (Subcutaneous Tissue) Exposed: Yes Tendon Exposed: No Muscle Exposed: No Joint Exposed: No Bone Exposed: No Periwound Skin Texture Texture Color No Abnormalities Noted: No No Abnormalities Noted: No Callus: No Atrophie Blanche: No Crepitus: No Cyanosis: No Excoriation: No Ecchymosis: No Induration: No Erythema: No Rash: No Hemosiderin Staining: No Scarring: No Mottled: No Pallor: No Moisture Rubor: No No Abnormalities Noted: No Dry / Scaly: No Temperature / Pain Maceration: No Temperature: No Abnormality Treatment Notes Wound #2 (Foot) Wound Laterality: Left, Distal Cleanser Soap and Water Discharge Instruction: May shower and wash wound with dial antibacterial soap and water prior to dressing change. Vashe 5.8 (oz) Discharge Instruction: Cleanse the wound with Vashe prior to applying a clean dressing using gauze sponges, not tissue or cotton balls. Peri-Wound Care Triamcinolone 15 (g) Discharge Instruction: Use triamcinolone ****liberally*** Sween Lotion (Moisturizing lotion) Discharge Instruction: Apply moisturizing lotion as directed Topical Triamcinolone Discharge Instruction: Apply Triamcinolone as directed liberallly Primary Dressing Hydrofera Blue Ready Transfer Foam, 2.5x2.5 (in/in) Discharge Instruction: Apply directly to wound bed as directed Santyl Ointment Discharge Instruction: Apply nickel thick amount to wound bed as instructed Secondary Dressing ABD Pad, 8x10 Discharge Instruction: Apply over primary dressing as directed. Woven Gauze Sponge, Non-Sterile 4x4 in Discharge Instruction: Apply over primary dressing as directed. Secured With Compression Wrap Kerlix Roll 4.5x3.1 (in/yd) Discharge Instruction: Apply Kerlix and Coban compression as directed. Coban Self-Adherent Wrap 4x5 (in/yd) Discharge Instruction: Apply over Kerlix as  directed. Compression Stockings Add-Ons CHARLII, SIANEZ [name] ([id number]) [id number] Page 13 of 18 Electronic Signature(s) Signed: 05/14/2023 12:52:25 PM By: Thayer Dallas Entered By: Thayer Dallas on 05/10/2023 10:09:40 -------------------------------------------------------------------------------- Wound Assessment Details Patient Name: Date of Service: [name], [name] [name].  05/10/2023 9:45 A M Medical Record Number: [id number] Patient Account Number: [id number] Date of Birth/Sex: Treating RN: 02-Apr-1986 (37 y.o. F) Primary Care Lavonda Thal: Sanda Linger Other Clinician: Referring [name]: Treating Tennis Mckinnon/Extender: Madelyn Flavors in Treatment: 6 Wound Status Wound Number: 3 Primary Etiology: Lymphedema Wound Location: Left, Proximal, Dorsal Foot Wound Status: Open Wounding Event: Gradually Appeared Comorbid History: Anemia, Sleep Apnea, Vasculitis, Neuropathy Date Acquired: 03/09/2023 Weeks Of Treatment: 6 Clustered Wound: Yes Photos Wound Measurements Length: (cm) Width: (cm) Depth: (cm) Clustered Quantity: Area: (cm) Volume: (cm) 5.3 % Reduction in Area: -40.1% 5.5 % Reduction in Volume: 29.9% 0.1 Epithelialization: None 3 Tunneling: No 22.894 Undermining: No 2.289 Wound Description Classification: Partial Thickness Exudate Amount: Medium Exudate Type: Serosanguineous Exudate Color: red, brown Wound Bed Granulation Amount: Large (67-100%) Exposed Structure Granulation Quality: Pink Fascia Exposed: No Necrotic Amount: Small (1-33%) Fat Layer (Subcutaneous Tissue) Exposed: Yes Necrotic Quality: Eschar, Adherent Slough Tendon Exposed: No Muscle Exposed: No Joint Exposed: No Bone Exposed: No Periwound Skin Texture Texture Color No Abnormalities Noted: No No Abnormalities Noted: No Callus: No Atrophie Blanche: No Crepitus: No Cyanosis: No Excoriation: No Ecchymosis: No Induration: No Erythema: No Rash:  No Hemosiderin Staining: No Persaud, Karlina [name] ([id number]) [id number] Page 14 of 18 Scarring: No Mottled: No Pallor: No Moisture Rubor: No No Abnormalities Noted: No Dry / Scaly: No Maceration: No Treatment Notes Wound #3 (Foot) Wound Laterality: Dorsal, Left, Proximal Cleanser Soap and Water Discharge Instruction: May shower and wash wound with dial antibacterial soap and water prior to dressing change. Vashe 5.8 (oz) Discharge Instruction: Cleanse the wound with Vashe prior to applying a clean dressing using gauze sponges, not tissue or cotton balls. Peri-Wound Care Triamcinolone 15 (g) Discharge Instruction: Use triamcinolone ****liberally*** Sween Lotion (Moisturizing lotion) Discharge Instruction: Apply moisturizing lotion as directed Topical Triamcinolone Discharge Instruction: Apply Triamcinolone as directed liberallly Primary Dressing Hydrofera Blue Ready Transfer Foam, 2.5x2.5 (in/in) Discharge Instruction: Apply directly to wound bed as directed Santyl Ointment Discharge Instruction: Apply nickel thick amount to wound bed as instructed Secondary Dressing ABD Pad, 8x10 Discharge Instruction: Apply over primary dressing as directed. Woven Gauze Sponge, Non-Sterile 4x4 in Discharge Instruction: Apply over primary dressing as directed. Secured With Compression Wrap Kerlix Roll 4.5x3.1 (in/yd) Discharge Instruction: Apply Kerlix and Coban compression as directed. Coban Self-Adherent Wrap 4x5 (in/yd) Discharge Instruction: Apply over Kerlix as directed. Compression Stockings Add-Ons Electronic Signature(s) Signed: 05/14/2023 12:52:25 PM By: Thayer Dallas Entered By: Thayer Dallas on 05/10/2023 10:11:44 -------------------------------------------------------------------------------- Wound Assessment Details Patient Name: Date of Service: [name], [name] [name]. 05/10/2023 9:45 A M Medical Record Number: [id number] Patient Account Number:  [id number] Date of Birth/Sex: Treating RN: Jul 15, 1985 (37 y.o. F) Primary Care Naje Rice: Sanda Linger Other Clinician: Referring Kamel Haven: Treating Raeshaun Simson/Extender: Madelyn Flavors in Treatment: 6 Wound Status [name] ([id number]) [id number] Page 15 of 18 Wound Number: 4 Primary Etiology: Lymphedema Wound Location: Right, Dorsal Foot Wound Status: Open Wounding Event: Gradually Appeared Comorbid History: Anemia, Sleep Apnea, Vasculitis, Neuropathy Date Acquired: 03/09/2023 Weeks Of Treatment: 6 Clustered Wound: Yes Photos Wound Measurements Length: (cm) Width: (cm) Depth: (cm) Clustered Quantity: Area: (cm) Volume: (cm) 6 % Reduction in Area: 50% 4 % Reduction in Volume: 0% 0.2 Epithelialization: Small (1-33%) 5 Tunneling: No 18.85 Undermining: No 3.77 Wound Description Classification: Full Thickness Without Exposed Support Structures Exudate Amount: Medium Exudate Type: Serosanguineous Exudate Color: red, brown Foul Odor After Cleansing: No Slough/Fibrino Yes Wound Bed Granulation Amount: Medium (34-66%) Exposed Structure  Granulation Quality: Red, Pink Fascia Exposed: No Necrotic Amount: Medium (34-66%) Fat Layer (Subcutaneous Tissue) Exposed: Yes Necrotic Quality: Adherent Slough Tendon Exposed: No Muscle Exposed: No Joint Exposed: No Bone Exposed: No Periwound Skin Texture Texture Color No Abnormalities Noted: No No Abnormalities Noted: No Callus: No Atrophie Blanche: No Crepitus: No Cyanosis: No Excoriation: No Ecchymosis: No Induration: No Erythema: No Rash: No Hemosiderin Staining: No Scarring: No Mottled: No Pallor: No Moisture Rubor: No No Abnormalities Noted: No Dry / Scaly: No Temperature / Pain Maceration: No Temperature: No Abnormality Treatment Notes Wound #4 (Foot) Wound Laterality: Dorsal, Right Cleanser Soap and Water Discharge Instruction: May shower and wash wound  with dial antibacterial soap and water prior to dressing change. Vashe 5.8 (oz) Discharge Instruction: Cleanse the wound with Vashe prior to applying a clean dressing using gauze sponges, not tissue or cotton balls. Peri-Wound Care Triamcinolone 15 (g) Discharge Instruction: Use triamcinolone ****liberally*** Sween Lotion (Moisturizing lotion) Discharge Instruction: Apply moisturizing lotion as directed FAREEHA, OKULA [name] ([id number]) [phone].pdf Page 16 of 18 Topical Triamcinolone Discharge Instruction: Apply Triamcinolone as directed liberallly Primary Dressing Hydrofera Blue Ready Transfer Foam, 2.5x2.5 (in/in) Discharge Instruction: Apply directly to wound bed as directed Santyl Ointment Discharge Instruction: Apply nickel thick amount to wound bed as instructed Secondary Dressing ABD Pad, 8x10 Discharge Instruction: Apply over primary dressing as directed. Woven Gauze Sponge, Non-Sterile 4x4 in Discharge Instruction: Apply over primary dressing as directed. Secured With Compression Wrap Kerlix Roll 4.5x3.1 (in/yd) Discharge Instruction: Apply Kerlix and Coban compression as directed. Coban Self-Adherent Wrap 4x5 (in/yd) Discharge Instruction: Apply over Kerlix as directed. Compression Stockings Add-Ons Electronic Signature(s) Signed: 05/14/2023 12:52:25 PM By: Thayer Dallas Entered By: Thayer Dallas on 05/10/2023 10:14:24 -------------------------------------------------------------------------------- Wound Assessment Details Patient Name: Date of Service: [name], [name] [name]. 05/10/2023 9:45 A M Medical Record Number: [id number] Patient Account Number: [id number] Date of Birth/Sex: Treating RN: [birth year] (37 y.o. [name], [name] Primary Care Maryagnes Carrasco: Sanda Linger Other Clinician: Referring Henritta Mutz: Treating Ronne Stefanski/Extender: Madelyn Flavors in Treatment: 6 Wound Status Wound Number: 5 Primary Etiology:  Lymphedema Wound Location: Right, Lateral Calcaneus Wound Status: Healed - Epithelialized Wounding Event: Gradually Appeared Comorbid History: Anemia, Sleep Apnea, Vasculitis, Neuropathy Date Acquired: 03/09/2023 Weeks Of Treatment: 6 Clustered Wound: No Photos Wound Measurements Shelnutt, [name] ([id number]) Length: (cm) Width: (cm) Depth: (cm) Area: (cm) Volume: (cm) [id number] Page 17 of 18 0 % Reduction in Area: 100% 0 % Reduction in Volume: 100% 0 Epithelialization: Small (1-33%) 0 Tunneling: No 0 Undermining: No Wound Description Classification: Full Thickness Without Exposed Support Exudate Amount: Medium Exudate Type: Serosanguineous Exudate Color: red, brown Structures Foul Odor After Cleansing: No Slough/Fibrino Yes Wound Bed Granulation Amount: None Present (0%) Exposed Structure Necrotic Amount: Large (67-100%) Fascia Exposed: No Fat Layer (Subcutaneous Tissue) Exposed: Yes Tendon Exposed: No Muscle Exposed: No Joint Exposed: No Bone Exposed: No Periwound Skin Texture Texture Color No Abnormalities Noted: No No Abnormalities Noted: No Callus: No Atrophie Blanche: No Crepitus: No Cyanosis: No Excoriation: No Ecchymosis: No Induration: No Erythema: No Rash: No Hemosiderin Staining: No Scarring: No Mottled: No Pallor: No Moisture Rubor: No No Abnormalities Noted: No Dry / Scaly: No Temperature / Pain Maceration: No Temperature: No Abnormality Treatment Notes Wound #5 (Calcaneus) Wound Laterality: Right, Lateral Cleanser Peri-Wound Care Topical Primary Dressing Secondary Dressing Secured With Compression Wrap Compression Stockings Add-Ons Electronic Signature(s) Signed: 05/11/2023 12:20:53 PM By: Shawn Stall RN, BSN Entered By: Shawn Stall on 05/10/2023 10:37:30 -------------------------------------------------------------------------------- Vitals Details Patient  Name: Date of Service: [name]  [name]. 05/10/2023 9:45 A M Medical Record Number: [id number] Patient Account Number: [id number] Date of Birth/Sex: Treating RN: 12-Dec-1985 (37 y.o. F) Primary Care Kaan Tosh: Sanda Linger Other Clinician: Referring Johanan Skorupski: Treating Kayd Launer/Extender: Madelyn Flavors in Treatment: [address] [place] [name] ([id number]) [id number] Page 18 of 18 Vital Signs Time Taken: 10:07 Temperature (F): 98.7 Height (in): 69 Pulse (bpm): 67 Weight (lbs): 272 Respiratory Rate (breaths/min): 18 Body Mass Index (BMI): 40.2 Blood Pressure (mmHg): 141/91 Reference Range: 80 - 120 mg / dl Electronic Signature(s) Signed: 05/14/2023 12:52:25 PM By: Thayer Dallas Entered By: Thayer Dallas on 05/10/2023 10:07:30

## 2023-05-17 ENCOUNTER — Encounter (HOSPITAL_BASED_OUTPATIENT_CLINIC_OR_DEPARTMENT_OTHER): Payer: BC Managed Care – PPO | Admitting: Internal Medicine

## 2023-05-17 DIAGNOSIS — G4730 Sleep apnea, unspecified: Secondary | ICD-10-CM | POA: Diagnosis not present

## 2023-05-17 DIAGNOSIS — I872 Venous insufficiency (chronic) (peripheral): Secondary | ICD-10-CM | POA: Diagnosis not present

## 2023-05-17 DIAGNOSIS — G8929 Other chronic pain: Secondary | ICD-10-CM | POA: Diagnosis not present

## 2023-05-17 DIAGNOSIS — L97528 Non-pressure chronic ulcer of other part of left foot with other specified severity: Secondary | ICD-10-CM | POA: Diagnosis not present

## 2023-05-17 DIAGNOSIS — I890 Lymphedema, not elsewhere classified: Secondary | ICD-10-CM | POA: Diagnosis not present

## 2023-05-17 DIAGNOSIS — E538 Deficiency of other specified B group vitamins: Secondary | ICD-10-CM | POA: Diagnosis not present

## 2023-05-17 DIAGNOSIS — I10 Essential (primary) hypertension: Secondary | ICD-10-CM | POA: Diagnosis not present

## 2023-05-17 DIAGNOSIS — D649 Anemia, unspecified: Secondary | ICD-10-CM | POA: Diagnosis not present

## 2023-05-17 DIAGNOSIS — L97518 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 severity: Secondary | ICD-10-CM | POA: Diagnosis not present

## 2023-05-17 NOTE — Progress Notes (Signed)
[name], [name] R ([id number]) [id number] Page 1 of 11 Visit Report for 05/17/2023 Debridement Details Patient Name: Date of Service: [name] NDRA R. 05/17/2023 9:45 A M Medical Record Number: [id number] Patient Account Number: [id number] Date of Birth/Sex: Treating RN: January 22, 1986 (37 y.o. [name] Primary Care Provider: Sanda Linger Other Clinician: Referring Provider: Treating Provider/Extender: Madelyn Flavors in Treatment: 7 Debridement Performed for Assessment: Wound #1 Left,Medial Foot Performed By: Physician Maxwell Caul., MD The following information was scribed by: Redmond Pulling The information was scribed for: [name] Debridement Type: Debridement Level of Consciousness (Pre-procedure): Awake and Alert Pre-procedure Verification/Time Out Yes - 10:20 Taken: Start Time: 10:20 Pain Control: Lidocaine 4% T opical Solution Percent of Wound Bed Debrided: 100% T Area Debrided (cm): otal 0.03 Tissue and other material debrided: Non-Viable, Slough, Slough Level: Non-Viable Tissue Debridement Description: Selective/Open Wound Instrument: Curette Bleeding: Minimum Hemostasis Achieved: Pressure Response to Treatment: Procedure was tolerated well Level of Consciousness (Post- Awake and Alert procedure): Post Debridement Measurements of Total Wound Length: (cm) 0.2 Width: (cm) 0.2 Depth: (cm) 0.2 Volume: (cm) 0.006 Character of Wound/Ulcer Post Debridement: Improved Post Procedure Diagnosis Same as Pre-procedure Electronic Signature(s) Signed: 05/17/2023 4:37:17 PM By: [name] Najjar MD Signed: 05/17/2023 5:15:15 PM By: Redmond Pulling RN, BSN Entered By: Redmond Pulling on 05/17/2023 07:22:34 -------------------------------------------------------------------------------- Debridement Details Patient Name: Date of Service: [name], [name] 05/17/2023 9:45 A M Medical Record Number:  [id number] Patient Account Number: [id number] Date of Birth/Sex: Treating RN: 07-11-1985 (37 y.o. [name] Primary Care Provider: Sanda Linger Other Clinician: Referring Provider: Treating Provider/Extender: Madelyn Flavors in Treatment: 7 Debridement Performed for Assessment: Wound #2 Left,Distal Foot Performed By: Physician Maxwell Caul., MD Debridement Type: Debridement Level of Consciousness (Pre-procedure): Awake and Alert CHINARA, CUMBA R ([id number]) [id number] Page 2 of 11 Pre-procedure Verification/Time Out Yes - 10:20 Taken: Start Time: 10:20 Pain Control: Lidocaine 4% T opical Solution Percent of Wound Bed Debrided: 100% T Area Debrided (cm): otal 2.2 Tissue and other material debrided: Non-Viable, Slough, Slough Level: Non-Viable Tissue Debridement Description: Selective/Open Wound Instrument: Curette Bleeding: Minimum Hemostasis Achieved: Pressure Response to Treatment: Procedure was tolerated well Level of Consciousness (Post- Awake and Alert procedure): Post Debridement Measurements of Total Wound Length: (cm) 1.4 Width: (cm) 2 Depth: (cm) 0.1 Volume: (cm) 0.22 Character of Wound/Ulcer Post Debridement: Improved Post Procedure Diagnosis Same as Pre-procedure Electronic Signature(s) Signed: 05/17/2023 4:37:17 PM By: [name] Najjar MD Signed: 05/17/2023 5:15:15 PM By: Redmond Pulling RN, BSN Entered By: Redmond Pulling on 05/17/2023 07:23:34 -------------------------------------------------------------------------------- Debridement Details Patient Name: Date of Service: [name], [name] 05/17/2023 9:45 A M Medical Record Number: [id number] Patient Account Number: [id number] Date of Birth/Sex: Treating RN: Oct 30, 1985 (37 y.o. [name] Primary Care Provider: Sanda Linger Other Clinician: Referring Provider: Treating Provider/Extender: Madelyn Flavors in  Treatment: 7 Debridement Performed for Assessment: Wound #3 Left,Proximal,Dorsal Foot Performed By: Physician Maxwell Caul., MD The following information was scribed by: Redmond Pulling The information was scribed for: [name] Debridement Type: Debridement Level of Consciousness (Pre-procedure): Awake and Alert Pre-procedure Verification/Time Out Yes - 10:20 Taken: Start Time: 10:20 Pain Control: Lidocaine 4% T opical Solution Percent of Wound Bed Debrided: 100% T Area Debrided (cm): otal 0.13 Tissue and other material debrided: Non-Viable, Slough, Slough Level: Non-Viable Tissue Debridement Description: Selective/Open Wound Instrument: Curette Bleeding: Minimum Hemostasis Achieved: Pressure Response to Treatment: Procedure was tolerated well Level of  Consciousness (Post- Awake and Alert procedure): Post Debridement Measurements of Total Wound Length: (cm) 0.4 Width: (cm) 0.4 Depth: (cm) 0.1 Volume: (cm) 0.013 Character of Wound/Ulcer Post Debridement: Improved [name], [name] R ([id number]) [phone].pdf Page 3 of 11 Post Procedure Diagnosis Same as Pre-procedure Electronic Signature(s) Signed: 05/17/2023 4:37:17 PM By: [name] Najjar MD Signed: 05/17/2023 5:15:15 PM By: Redmond Pulling RN, BSN Entered By: Redmond Pulling on 05/17/2023 07:24:11 -------------------------------------------------------------------------------- HPI Details Patient Name: Date of Service: [name], [name] 05/17/2023 9:45 A M Medical Record Number: [id number] Patient Account Number: [id number] Date of Birth/Sex: Treating RN: Mar 11, 1986 (37 y.o. F) Primary Care Provider: Sanda Linger Other Clinician: Referring Provider: Treating Provider/Extender: Madelyn Flavors in Treatment: 7 History of Present Illness HPI Description: ADMISSION 03/23/2023 This is a 37 year old woman who is Story is somewhat difficult to follow however she  apparently has developed bilateral lower extremity swelling involving her feet and ankles roughly 3 weeks ago. This but this became associated with small painful areas on predominantly her left greater than right foot with exfoliation of the skin. She was admitted to hospital from 03/16/2023 through 03/19/2023. X-ray of the feet was negative however she was noted to have a sedimentation rate greater than 90. Although she was empirically given antibiotics at the start of the admission. She was seen by infectious disease who did not feel this was infectious. It was felt that she required a skin biopsy for possible vasculitis. Dermatology and rheumatology consults were suggested. She was discharged from the hospital on a gradually tapering dose of prednisone. She had cryoglobulins, complement levels measured. She did not have an active urinalysis. She has been soaking her feet in ice water and cold towels to relieve the pain. She saw her dermatologist who is in [place] yesterday for consideration of a skin biopsy. Per the patient's description the dermatologist did not wish to do a skin biopsy stating that he did not want to create another wound area although I do not have his or her notes at this point and I do not know what diagnosis she was felt to have. She arrives in clinic today with discomfort in her feet. Exfoliating skin on the dorsal surfaces of both feet punched-out areas with necrosis especially on the left dorsal foot left lateral ankle. There is also areas on the right heel. Past medical history includes acute hepatitis question involving alcohol or hepatitis A, bilateral leg edema, obesity, hypertension, history of psoriasis on Taltz, chronic pain, apparently some form of idiopathic peripheral neuropathy, vitamin B12 deficiency ABIs in our clinic were 1.03 on the right 1.25 on the left Addendum 03/25/2023. I received the records from Dr. Cheree Ditto at [facility] dermatology in [place]. Looking at her  record she felt that the issue was secondary to lymphedema and stasis dermatitis on both legs. I am not sure whether she looked at the areas on her feet which was the real area of concern. She specifically stated that she did not think that this was related to her psoriasis. Compression stockings were suggested. No biopsy was done 9/24; as noted above I did receive records from Dr. Cheree Ditto at [facility] dermatology and [place]. She felt the skin changes on her feet were related to lymphedema and stasis dermatitis. The patient arrived back in clinic the skin on her dorsal feet looks a lot better and she is in a lot less pain. HOWEVER she does have multiple small punched- out areas covered in black eschar. I did not biopsy this  when I saw her the first time as she was going to dermatology the next day in retrospect that may not have been the right decision. 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 9/30; patient arrives in clinic with no complaints of pain at all. This is quite an improvement from the first time I saw her. T the punched out holes on her dorsal o feet and right ankle we have been using Santyl. The eschar is loosening to the point where it might be a reasonably easy debridement. We have also been using TCA and the skin on her feet looks a lot better. She is still on prednisone 40 mg. This was started tapering 60 mg 10 mg weekly in the hospital for vasculitis. Unfortunately no tissue biopsy was done. 10/8; again no pain. The skin in her bilateral dorsal feet looks better than when she first came into the clinic. We have been wrapping her there is no real edema. She comes into the clinic with the wraps falling all the way down however I am not sure what the issue here is. As it turns out nobody is following the prednisone dosage. She went to her primary doctor he did not really address this she is  currently on 30 mg of prednisone. As we do not have a diagnosis here I am going to taper the prednisone a little more aggressively and follow the condition of her legs. If she develops other lesions then we will have to think about this but right now we are keeping her on high doses of prednisone without a diagnosis I simply do not agree with this. 10/15; I have tapered her down to 10 mg of prednisone I put in for 5 mg tablets enough for 5 days. Counseled her about steroid withdrawal also the implications for rapid deterioration in her underlying status. As I mentioned previously no concrete diagnosis is available. She did not receive a biopsy. 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 She arrived in clinic today with multiple areas on her bilateral dorsal feet smaller or healed but still some areas that have a necrotic surface although the covering eschar is certainly less adherent that when I first saw her 10/22; starting the 5 mg of prednisone today. Still using Santyl, Hydrofera Blue on the open areas on her feet and ankles. We are still putting her in [name], [name] R ([id number]) [id number] Page 4 of 11 compression. Liberal use of triamcinolone under the compression The wounds are a lot better today many of them are healed. No new wound areas 10/28; will be finished her prednisone by the middle part of this week. Still using Santyl, Hydrofera Blue and TCA under compression to her bilateral feet. There are no open wounds. Almost all of her open areas have a clean surface except for 1 area on the right dorsal foot. She did not wish to be debrided this week rather she stated she would put that off till next week 11/4; everything continues to be a lot better. Several of the punched-out wounds have healed including the area on the right lateral calcaneus. We continue with Hydrofera Blue Santyl TCA widely under compression. She is not having  any ill effects of stopping her prednisone she feels well. 11/11; the patient still has small open areas on the right dorsal foot, left medial foot left dorsal foot. No  new wounds. Some of these wounds from last time if healed. No ill effects of stopping her prednisone. Using [employer]) Signed: 05/17/2023 4:37:17 PM By: [name] Najjar MD Entered By: [name] on 05/17/2023 30:16:01 -------------------------------------------------------------------------------- Physical Exam Details Patient Name: Date of Service: [name], [name] 05/17/2023 9:45 A M Medical Record Number: [id number] Patient Account Number: [id number] Date of Birth/Sex: Treating RN: 1986-02-24 (37 y.o. F) Primary Care Provider: Sanda Linger Other Clinician: Referring Provider: Treating Provider/Extender: Madelyn Flavors in Treatment: 7 Constitutional Patient is hypertensive.. Pulse regular and within target range for patient.Marland Kitchen Respirations regular, non-labored and within target range.. Temperature is normal and within the target range for the patient.Marland Kitchen Appears in no distress. Notes Wound exam; the major wound on the left fourth fifth toe dorsally continues to contract. Small wounds on the left medial foot, the left medial great toe 2 small areas dorsally. All of this is better. I used a #3 curette to remove slough from the wound near the left fourth toe dorsally and the left medial foot. On the right, she has a cluster of 4 clean wounds dorsally no record no debridement was required in any of these areas Electronic Signature(s) Signed: 05/17/2023 4:37:17 PM By: [name] Najjar MD Entered By: [name] on 05/17/2023 07:30:44 -------------------------------------------------------------------------------- Physician Orders Details Patient Name: Date of Service: [name], [name] 05/17/2023 9:45 A M Medical Record Number: [id number] Patient  Account Number: [id number] Date of Birth/Sex: Treating RN: 07/20/1985 (37 y.o. [name] Primary Care Provider: Sanda Linger Other Clinician: Referring Provider: Treating Provider/Extender: Madelyn Flavors in Treatment: 7 Verbal / Phone Orders: No Diagnosis Coding Follow-up Appointments ppointment in 1 week. - Dr. Leanord Hawking - 05/24/2023 0815 Return A ppointment in 2 weeks. - Dr. Leanord Hawking - Return A Other: - ********Start tapering off prednisone- 3 tablets a day for three days, 2 tablets a day for three days, 1 tablet a day for three days. Then at next appt may provide you with 5mg  tablets.************ [name] ([id number]) [id number] Page 5 of 11 ****COMPRESSION WRAPS STOP JUST BELOW THE CALVES.**** Anesthetic (In clinic) Topical Lidocaine 4% applied to wound bed Bathing/ Shower/ Hygiene May shower with protection but do not get wound dressing(s) wet. Protect dressing(s) with water repellant cover (for example, large plastic bag) or a cast cover and may then take shower. Wound Treatment Wound #1 - Foot Wound Laterality: Left, Medial Cleanser: Soap and Water 1 x Per Week/30 Days Discharge Instructions: May shower and wash wound with dial antibacterial soap and water prior to dressing change. Cleanser: Vashe 5.8 (oz) 1 x Per Week/30 Days Discharge Instructions: Cleanse the wound with Vashe prior to applying a clean dressing using gauze sponges, not tissue or cotton balls. Peri-Wound Care: Sween Lotion (Moisturizing lotion) 1 x Per Week/30 Days Discharge Instructions: Apply moisturizing lotion as directed Prim Dressing: Hydrofera Blue Ready Transfer Foam, 2.5x2.5 (in/in) 1 x Per Week/30 Days ary Discharge Instructions: Apply directly to wound bed as directed Prim Dressing: Santyl Ointment 1 x Per Week/30 Days ary Discharge Instructions: Apply nickel thick amount to wound bed as instructed Secondary Dressing: ABD Pad, 8x10 1  x Per Week/30 Days Discharge Instructions: Apply over primary dressing as directed. Secondary Dressing: Woven Gauze Sponge, Non-Sterile 4x4 in 1 x Per Week/30 Days Discharge Instructions: Apply over primary dressing as directed. Compression Wrap: Kerlix Roll 4.5x3.1 (in/yd) 1 x Per Week/30 Days Discharge Instructions: Apply Kerlix and Coban  compression as directed. Compression Wrap: Coban Self-Adherent Wrap 4x5 (in/yd) 1 x Per Week/30 Days Discharge Instructions: Apply over Kerlix as directed. Wound #2 - Foot Wound Laterality: Left, Distal Cleanser: Soap and Water 1 x Per Week/30 Days Discharge Instructions: May shower and wash wound with dial antibacterial soap and water prior to dressing change. Cleanser: Vashe 5.8 (oz) 1 x Per Week/30 Days Discharge Instructions: Cleanse the wound with Vashe prior to applying a clean dressing using gauze sponges, not tissue or cotton balls. Peri-Wound Care: Sween Lotion (Moisturizing lotion) 1 x Per Week/30 Days Discharge Instructions: Apply moisturizing lotion as directed Prim Dressing: Hydrofera Blue Ready Transfer Foam, 2.5x2.5 (in/in) 1 x Per Week/30 Days ary Discharge Instructions: Apply directly to wound bed as directed Prim Dressing: Santyl Ointment 1 x Per Week/30 Days ary Discharge Instructions: Apply nickel thick amount to wound bed as instructed Secondary Dressing: ABD Pad, 8x10 1 x Per Week/30 Days Discharge Instructions: Apply over primary dressing as directed. Secondary Dressing: Woven Gauze Sponge, Non-Sterile 4x4 in 1 x Per Week/30 Days Discharge Instructions: Apply over primary dressing as directed. Compression Wrap: Kerlix Roll 4.5x3.1 (in/yd) 1 x Per Week/30 Days Discharge Instructions: Apply Kerlix and Coban compression as directed. Compression Wrap: Coban Self-Adherent Wrap 4x5 (in/yd) 1 x Per Week/30 Days Discharge Instructions: Apply over Kerlix as directed. Wound #3 - Foot Wound Laterality: Dorsal, Left, Proximal Cleanser:  Soap and Water 1 x Per Week/30 Days Discharge Instructions: May shower and wash wound with dial antibacterial soap and water prior to dressing change. Cleanser: Vashe 5.8 (oz) 1 x Per Week/30 Days Discharge Instructions: Cleanse the wound with Vashe prior to applying a clean dressing using gauze sponges, not tissue or cotton balls. Peri-Wound Care: Sween Lotion (Moisturizing lotion) 1 x Per Week/30 Days Discharge Instructions: Apply moisturizing lotion as directed Prim Dressing: Hydrofera Blue Ready Transfer Foam, 2.5x2.5 (in/in) ary 1 x Per Week/30 Days CALIANNE, HARAWAY R ([id number]) [phone].pdf Page 6 of 11 Discharge Instructions: Apply directly to wound bed as directed Prim Dressing: Santyl Ointment 1 x Per Week/30 Days ary Discharge Instructions: Apply nickel thick amount to wound bed as instructed Secondary Dressing: ABD Pad, 8x10 1 x Per Week/30 Days Discharge Instructions: Apply over primary dressing as directed. Secondary Dressing: Woven Gauze Sponge, Non-Sterile 4x4 in 1 x Per Week/30 Days Discharge Instructions: Apply over primary dressing as directed. Compression Wrap: Kerlix Roll 4.5x3.1 (in/yd) 1 x Per Week/30 Days Discharge Instructions: Apply Kerlix and Coban compression as directed. Compression Wrap: Coban Self-Adherent Wrap 4x5 (in/yd) 1 x Per Week/30 Days Discharge Instructions: Apply over Kerlix as directed. Wound #4 - Foot Wound Laterality: Dorsal, Right Cleanser: Soap and Water 1 x Per Week/30 Days Discharge Instructions: May shower and wash wound with dial antibacterial soap and water prior to dressing change. Cleanser: Vashe 5.8 (oz) 1 x Per Week/30 Days Discharge Instructions: Cleanse the wound with Vashe prior to applying a clean dressing using gauze sponges, not tissue or cotton balls. Peri-Wound Care: Sween Lotion (Moisturizing lotion) 1 x Per Week/30 Days Discharge Instructions: Apply moisturizing lotion as directed Prim Dressing:  Hydrofera Blue Ready Transfer Foam, 2.5x2.5 (in/in) 1 x Per Week/30 Days ary Discharge Instructions: Apply directly to wound bed as directed Prim Dressing: Santyl Ointment 1 x Per Week/30 Days ary Discharge Instructions: Apply nickel thick amount to wound bed as instructed Secondary Dressing: ABD Pad, 8x10 1 x Per Week/30 Days Discharge Instructions: Apply over primary dressing as directed. Secondary Dressing: Woven Gauze Sponge, Non-Sterile 4x4 in 1 x Per  Week/30 Days Discharge Instructions: Apply over primary dressing as directed. Compression Wrap: Kerlix Roll 4.5x3.1 (in/yd) 1 x Per Week/30 Days Discharge Instructions: Apply Kerlix and Coban compression as directed. Compression Wrap: Coban Self-Adherent Wrap 4x5 (in/yd) 1 x Per Week/30 Days Discharge Instructions: Apply over Kerlix as directed. Patient Medications llergies: Lyrica A Notifications Medication Indication Start End 05/17/2023 lidocaine DOSE topical 4 % cream - cream topical once daily Electronic Signature(s) Signed: 05/17/2023 4:37:17 PM By: [name] Najjar MD Signed: 05/17/2023 5:15:15 PM By: Redmond Pulling RN, BSN Entered By: Redmond Pulling on 05/17/2023 07:26:09 -------------------------------------------------------------------------------- Problem List Details Patient Name: Date of Service: [name], [name] 05/17/2023 9:45 A M Medical Record Number: [id number] Patient Account Number: [id number] Date of Birth/Sex: Treating RN: [birth year] (37 y.o. 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 05/17/2023 9:45 A M Medical Record Number: [id number] Patient Account Number: [id number] Date of Birth/Sex: Treating RN: Nov 27, 1985 (37 y.o. F) Primary Care Provider: Sanda Linger Other Clinician: Referring Provider: Treating Provider/Extender: Madelyn Flavors in Treatment: 7 Subjective History of Present Illness (HPI) ADMISSION 03/23/2023 This is a 37 year old woman who is Story is somewhat difficult to follow however she apparently has developed bilateral lower extremity swelling involving her feet and ankles roughly 3 weeks ago. This but this became associated with small painful areas on predominantly her left greater than right foot with exfoliation of the skin. She was admitted to hospital from 03/16/2023 through 03/19/2023. X-ray of the feet was negative however she was noted to have a sedimentation rate greater than 90. Although she was empirically given antibiotics at the start of the admission. She was seen by infectious disease who did not feel this was infectious. It was felt that she required a skin biopsy for possible vasculitis. Dermatology and rheumatology consults were suggested. She was discharged from the hospital on a gradually tapering dose of prednisone. She had cryoglobulins, complement levels measured. She did not have an active urinalysis. She has been soaking her feet in ice water and cold towels to relieve the pain. She saw her dermatologist who is in [place] yesterday for consideration of a  skin biopsy. Per the patient's description the dermatologist did not wish to do a skin biopsy stating that he did not want to create another wound area although I do not have his or her notes at this point and I do not know what diagnosis she was felt to have. She arrives in clinic today with discomfort in her feet. Exfoliating skin on the dorsal surfaces of both feet punched-out areas with necrosis especially on the left dorsal foot left lateral ankle. There is also areas on the right heel. Past medical history includes acute hepatitis question involving alcohol or hepatitis A, bilateral leg edema, obesity, hypertension, history of psoriasis on Taltz, chronic pain, apparently some form of idiopathic peripheral  neuropathy, vitamin B12 deficiency ABIs in our clinic were 1.03 on the right 1.25 on the left Addendum 03/25/2023. I received the records from Dr. Cheree Ditto at [facility] dermatology in [place]. Looking at her record she felt that the issue was secondary to lymphedema and stasis dermatitis on both legs. I am not sure whether she looked at the areas on her feet which was the real area of concern. She specifically stated that she did not think that this was related to her psoriasis. Compression stockings were suggested. No biopsy was done 9/24; as noted above I did receive records from Dr. Cheree Ditto at [facility] and [place]. She felt the skin changes on her feet were related to lymphedema and stasis dermatitis. The patient arrived back in clinic the skin on her dorsal feet looks a lot better and she is in a lot less pain. HOWEVER she does have multiple small punched- out areas covered in black eschar. RAVAN, WOLSKY R ([id number]) [id number] Page 8 of 11 I did not biopsy this when I saw her the first time as she was going to dermatology the next day in retrospect that may not have been the right decision. 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 9/30; patient arrives in clinic with no complaints of pain at all. This is quite an improvement from the first time I saw her. T the punched out holes on her dorsal o feet and right ankle we have been using Santyl. The eschar is loosening to the point where it might be a reasonably easy debridement. We have also been using TCA and the skin on her feet looks a lot better. She is still on prednisone 40 mg. This was started tapering 60 mg 10 mg weekly in the hospital for vasculitis. Unfortunately no tissue biopsy was done. 10/8; again no pain. The skin in her bilateral dorsal feet looks better than when she first came into the clinic. We have been wrapping her there is no real edema. She comes into the clinic with the wraps falling all the way down however I am not sure what the issue here is. As it turns out nobody is following the prednisone dosage. She went to her primary doctor he did not really address this she is currently on 30 mg of prednisone. As we do not have a diagnosis here I am going to taper the prednisone a little more aggressively and follow the condition of her legs. If she develops other lesions then we will have to think about this but right now we are keeping her on high doses of prednisone without a diagnosis I simply do not agree with this. 10/15; I have tapered her down to 10 mg of prednisone I put in for 5 mg tablets enough for 5 days. Counseled her about steroid withdrawal also the implications for rapid deterioration in her underlying status. As I mentioned previously no concrete diagnosis is available. She did not receive a biopsy. 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 She arrived in clinic today with multiple areas on her bilateral dorsal feet smaller or healed but still some areas that have a necrotic surface although the covering eschar is certainly less adherent that  when I first saw her 10/22; starting the 5 mg of prednisone today. Still using Santyl, Hydrofera Blue on the open areas on her feet and ankles. We  are still putting her in compression. Liberal use of triamcinolone under the compression The wounds are a lot better today many of them are healed. No new wound areas 10/28; will be finished her prednisone by the middle part of this week. Still using Santyl, Hydrofera Blue and TCA under compression to her bilateral feet. There are no open wounds. Almost all of her open areas have a clean surface except for 1 area on the right dorsal foot. She did not wish to be debrided this week rather she stated she would put that off till next week 11/4; everything continues to be a lot better. Several of the punched-out wounds have healed including the area on the right lateral calcaneus. We continue with Hydrofera Blue Santyl TCA widely under compression. She is not having any ill effects of stopping her prednisone she feels well. 11/11; the patient still has small open areas on the right dorsal foot, left medial foot left dorsal foot. No new wounds. Some of these wounds from last time if healed. No ill effects of stopping her prednisone. Using Hydrofera Blue and Santyl Objective Constitutional Patient is hypertensive.. Pulse regular and within target range for patient.Marland Kitchen Respirations regular, non-labored and within target range.. Temperature is normal and within the target range for the patient.Marland Kitchen Appears in no distress. Vitals Time Taken: 10:00 AM, Height: 69 in, Weight: 272 lbs, BMI: 40.2, Temperature: 98.1 F, Pulse: 120 bpm, Respiratory Rate: 18 breaths/min, Blood Pressure: 160/101 mmHg. General Notes: Wound exam; the major wound on the left fourth fifth toe dorsally continues to contract. Small wounds on the left medial foot, the left medial great toe 2 small areas dorsally. All of this is better. I used a #3 curette to remove slough from the wound near the left  fourth toe dorsally and the left medial foot. On the right, she has a cluster of 4 clean wounds dorsally no record no debridement was required in any of these areas Integumentary (Hair, Skin) Wound #1 status is Open. Original cause of wound was Gradually Appeared. The date acquired was: 03/09/2023. The wound has been in treatment 7 weeks. The wound is located on the Left,Medial Foot. The wound measures 0.2cm length x 0.2cm width x 0.2cm depth; 0.031cm^2 area and 0.006cm^3 volume. There is Fat Layer (Subcutaneous Tissue) exposed. There is no tunneling or undermining noted. There is a small amount of serosanguineous drainage noted. There is small (1-33%) granulation within the wound bed. There is a large (67-100%) amount of necrotic tissue within the wound bed including Eschar and Adherent Slough. The periwound skin appearance did not exhibit: Callus, Crepitus, Excoriation, Induration, Rash, Scarring, Dry/Scaly, Maceration, Atrophie Blanche, Cyanosis, Ecchymosis, Hemosiderin Staining, Mottled, Pallor, Rubor, Erythema. Periwound temperature was noted as No Abnormality. Wound #2 status is Open. Original cause of wound was Gradually Appeared. The date acquired was: 03/09/2023. The wound has been in treatment 7 weeks. The wound is located on the Left,Distal Foot. The wound measures 1.4cm length x 2cm width x 0.1cm depth; 2.199cm^2 area and 0.22cm^3 volume. There is Fat Layer (Subcutaneous Tissue) exposed. There is no tunneling or undermining noted. There is a medium amount of serosanguineous drainage noted. There is large (67-100%) red, pink granulation within the wound bed. There is no necrotic tissue within the wound bed. The periwound skin appearance did not exhibit: Callus, Crepitus, Excoriation, Induration, Rash, Scarring, Dry/Scaly, Maceration, Atrophie Blanche, Cyanosis, Ecchymosis, Hemosiderin Staining, Mottled, Pallor, Rubor, Erythema. Periwound temperature was noted as No Abnormality. Wound #3 status  is Open. Original  cause of wound was Gradually Appeared. The date acquired was: 03/09/2023. The wound has been in treatment 7 weeks. The wound is located on the Left,Proximal,Dorsal Foot. The wound measures 0.4cm length x 0.4cm width x 0.1cm depth; 0.126cm^2 area and 0.013cm^3 volume. There is Fat Layer (Subcutaneous Tissue) exposed. There is no tunneling or undermining noted. There is a small amount of serosanguineous drainage noted. There is large (67-100%) pink granulation within the wound bed. There is a small (1-33%) amount of necrotic tissue within the wound bed including Eschar and Adherent Slough. The periwound skin appearance did not exhibit: Callus, Crepitus, Excoriation, Induration, Rash, Scarring, Dry/Scaly, Maceration, Atrophie Blanche, Cyanosis, Ecchymosis, Hemosiderin Staining, Mottled, Pallor, Rubor, Erythema. Wound #4 status is Open. Original cause of wound was Gradually Appeared. The date acquired was: 03/09/2023. The wound has been in treatment 7 weeks. The wound is located on the Right,Dorsal Foot. The wound measures 4.5cm length x 4.5cm width x 0.2cm depth; 15.904cm^2 area and 3.181cm^3 volume. There is Fat Layer (Subcutaneous Tissue) exposed. There is no tunneling or undermining noted. There is a medium amount of serosanguineous drainage noted. There is medium (34-66%) red, pink granulation within the wound bed. There is a medium (34-66%) amount of necrotic tissue within the wound bed including Adherent KIMALA, MCCLEESE R ([id number]) [phone].pdf Page 9 of [address] The periwound skin appearance did not exhibit: Callus, Crepitus, Excoriation, Induration, Rash, Scarring, Dry/Scaly, Maceration, Atrophie Blanche, Cyanosis, Ecchymosis, Hemosiderin Staining, Mottled, Pallor, Rubor, Erythema. Periwound temperature was noted as No Abnormality. 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 There was a Selective/Open Wound Non-Viable Tissue Debridement with a total area of 0.03 sq cm performed by Maxwell Caul., MD. With the following instrument(s): Curette to remove Non-Viable tissue/material. Material removed includes [facility] after achieving pain control using Lidocaine 4% Topical Solution. No specimens were taken. A time out was conducted at 10:20, prior to the start of the procedure. A Minimum amount of bleeding was controlled with Pressure. The procedure was tolerated well. Post Debridement Measurements: 0.2cm length x 0.2cm width x 0.2cm depth; 0.006cm^3 volume. Character of Wound/Ulcer Post Debridement is improved. Post procedure Diagnosis Wound #1: Same as Pre-Procedure Wound #2 Pre-procedure diagnosis of Wound #2 is a Lymphedema located on the Left,Distal Foot . There was a Selective/Open Wound Non-Viable Tissue Debridement with a total area of 2.2 sq cm performed by Maxwell Caul., MD. With the following instrument(s): Curette to remove Non-Viable tissue/material. Material removed includes [facility] after achieving pain control using Lidocaine 4% Topical Solution. No specimens were taken. A time out was conducted at 10:20, prior to the start of the procedure. A Minimum amount of bleeding was controlled with Pressure. The procedure was tolerated well. Post Debridement Measurements: 1.4cm length x 2cm width x 0.1cm depth; 0.22cm^3 volume. Character of Wound/Ulcer Post Debridement is improved. Post procedure Diagnosis Wound #2: Same as Pre-Procedure Wound #3 Pre-procedure diagnosis of Wound #3 is a Lymphedema located on the Left,Proximal,Dorsal Foot . There was a Selective/Open Wound Non-Viable Tissue Debridement with a total area of 0.13  sq cm performed by Maxwell Caul., MD. With the following instrument(s): Curette to remove Non-Viable tissue/material. Material removed includes [facility] after achieving pain control using Lidocaine 4% Topical Solution. No specimens  were taken. A time out was conducted at 10:20, prior to the start of the procedure. A Minimum amount of bleeding was controlled with Pressure. The procedure was tolerated well. Post Debridement Measurements: 0.4cm length x 0.4cm width x 0.1cm depth; 0.013cm^3 volume. Character of Wound/Ulcer Post Debridement is improved. Post procedure Diagnosis Wound #3: Same as Pre-Procedure Plan Follow-up Appointments: Return Appointment in 1 week. - Dr. Leanord Hawking - 05/24/2023 0815 Return Appointment in 2 weeks. - Dr. Leanord Hawking - Other: - ********Start tapering off prednisone- 3 tablets a day for three days, 2 tablets a day for three days, 1 tablet a day for three days. Then at next appt may provide you with 5mg  tablets.************ ****COMPRESSION WRAPS STOP JUST BELOW THE CALVES.**** Anesthetic: (In clinic) Topical Lidocaine 4% applied to wound bed Bathing/ Shower/ Hygiene: May shower with protection but do not get wound dressing(s) wet. Protect dressing(s) with water repellant cover (for example, large plastic bag) or a cast cover and may then take shower. The following medication(s) was prescribed: lidocaine topical 4 % cream cream topical once daily was prescribed at facility WOUND #1: - Foot Wound Laterality: Left, Medial Cleanser: Soap and Water 1 x Per Week/30 Days Discharge Instructions: May shower and wash wound with dial antibacterial soap and water prior to dressing change. Cleanser: Vashe 5.8 (oz) 1 x Per Week/30 Days Discharge Instructions: Cleanse the wound with Vashe prior to applying a clean dressing using gauze sponges, not tissue or cotton balls. Peri-Wound Care: Sween Lotion (Moisturizing lotion) 1 x Per Week/30 Days Discharge Instructions: Apply moisturizing  lotion as directed Prim Dressing: Hydrofera Blue Ready Transfer Foam, 2.5x2.5 (in/in) 1 x Per Week/30 Days ary Discharge Instructions: Apply directly to wound bed as directed Prim Dressing: Santyl Ointment 1 x Per Week/30 Days ary Discharge Instructions: Apply nickel thick amount to wound bed as instructed Secondary Dressing: ABD Pad, 8x10 1 x Per Week/30 Days Discharge Instructions: Apply over primary dressing as directed. Secondary Dressing: Woven Gauze Sponge, Non-Sterile 4x4 in 1 x Per Week/30 Days Discharge Instructions: Apply over primary dressing as directed. Com pression Wrap: Kerlix Roll 4.5x3.1 (in/yd) 1 x Per Week/30 Days Discharge Instructions: Apply Kerlix and Coban compression as directed. Com pression Wrap: Coban Self-Adherent Wrap 4x5 (in/yd) 1 x Per Week/30 Days FENNA, FENNEMA R ([id number]) [phone].pdf Page 10 of 11 Discharge Instructions: Apply over Kerlix as directed. WOUND #2: - Foot Wound Laterality: Left, Distal Cleanser: Soap and Water 1 x Per Week/30 Days Discharge Instructions: May shower and wash wound with dial antibacterial soap and water prior to dressing change. Cleanser: Vashe 5.8 (oz) 1 x Per Week/30 Days Discharge Instructions: Cleanse the wound with Vashe prior to applying a clean dressing using gauze sponges, not tissue or cotton balls. Peri-Wound Care: Sween Lotion (Moisturizing lotion) 1 x Per Week/30 Days Discharge Instructions: Apply moisturizing lotion as directed Prim Dressing: Hydrofera Blue Ready Transfer Foam, 2.5x2.5 (in/in) 1 x Per Week/30 Days ary Discharge Instructions: Apply directly to wound bed as directed Prim Dressing: Santyl Ointment 1 x Per Week/30 Days ary Discharge Instructions: Apply nickel thick amount to wound bed as instructed Secondary Dressing: ABD Pad, 8x10 1 x Per Week/30 Days Discharge Instructions: Apply over primary dressing as directed. Secondary Dressing: Woven Gauze Sponge, Non-Sterile  4x4 in 1 x Per Week/30 Days Discharge Instructions: Apply over primary dressing as directed. Com pression Wrap: Kerlix Roll 4.5x3.1 (in/yd) 1 x Per Week/30 Days Discharge Instructions: Apply Kerlix and Coban compression as directed. Com pression Wrap: Coban Self-Adherent Wrap 4x5 (in/yd)  1 x Per Week/30 Days Discharge Instructions: Apply over Kerlix as directed. WOUND #3: - Foot Wound Laterality: Dorsal, Left, Proximal Cleanser: Soap and Water 1 x Per Week/30 Days Discharge Instructions: May shower and wash wound with dial antibacterial soap and water prior to dressing change. Cleanser: Vashe 5.8 (oz) 1 x Per Week/30 Days Discharge Instructions: Cleanse the wound with Vashe prior to applying a clean dressing using gauze sponges, not tissue or cotton balls. Peri-Wound Care: Sween Lotion (Moisturizing lotion) 1 x Per Week/30 Days Discharge Instructions: Apply moisturizing lotion as directed Prim Dressing: Hydrofera Blue Ready Transfer Foam, 2.5x2.5 (in/in) 1 x Per Week/30 Days ary Discharge Instructions: Apply directly to wound bed as directed Prim Dressing: Santyl Ointment 1 x Per Week/30 Days ary Discharge Instructions: Apply nickel thick amount to wound bed as instructed Secondary Dressing: ABD Pad, 8x10 1 x Per Week/30 Days Discharge Instructions: Apply over primary dressing as directed. Secondary Dressing: Woven Gauze Sponge, Non-Sterile 4x4 in 1 x Per Week/30 Days Discharge Instructions: Apply over primary dressing as directed. Com pression Wrap: Kerlix Roll 4.5x3.1 (in/yd) 1 x Per Week/30 Days Discharge Instructions: Apply Kerlix and Coban compression as directed. Com pression Wrap: Coban Self-Adherent Wrap 4x5 (in/yd) 1 x Per Week/30 Days Discharge Instructions: Apply over Kerlix as directed. WOUND #4: - Foot Wound Laterality: Dorsal, Right Cleanser: Soap and Water 1 x Per Week/30 Days Discharge Instructions: May shower and wash wound with dial antibacterial soap and water prior to  dressing change. Cleanser: Vashe 5.8 (oz) 1 x Per Week/30 Days Discharge Instructions: Cleanse the wound with Vashe prior to applying a clean dressing using gauze sponges, not tissue or cotton balls. Peri-Wound Care: Sween Lotion (Moisturizing lotion) 1 x Per Week/30 Days Discharge Instructions: Apply moisturizing lotion as directed Prim Dressing: Hydrofera Blue Ready Transfer Foam, 2.5x2.5 (in/in) 1 x Per Week/30 Days ary Discharge Instructions: Apply directly to wound bed as directed Prim Dressing: Santyl Ointment 1 x Per Week/30 Days ary Discharge Instructions: Apply nickel thick amount to wound bed as instructed Secondary Dressing: ABD Pad, 8x10 1 x Per Week/30 Days Discharge Instructions: Apply over primary dressing as directed. Secondary Dressing: Woven Gauze Sponge, Non-Sterile 4x4 in 1 x Per Week/30 Days Discharge Instructions: Apply over primary dressing as directed. Com pression Wrap: Kerlix Roll 4.5x3.1 (in/yd) 1 x Per Week/30 Days Discharge Instructions: Apply Kerlix and Coban compression as directed. Com pression Wrap: Coban Self-Adherent Wrap 4x5 (in/yd) 1 x Per Week/30 Days Discharge Instructions: Apply over Kerlix as directed. 1. I have continued with Santyl and Hydrofera Blue to all wound areas 2. I have withdrawn the triamcinolone I then applied applying to her dorsal feet since the first day I saw her. Electronic Signature(s) Signed: 05/17/2023 4:37:17 PM By: [name] Najjar MD Entered By: [name] on 05/17/2023 07:32:10 -------------------------------------------------------------------------------- SuperBill Details Patient Name: Date of Service: [name], [name] 05/17/2023 Medical Record Number: [id number] Patient Account Number: [id number] Date of Birth/Sex: Treating RN: [birth year] (37 y.o. 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 Facility Procedures : CPT4 Code: [id number] Description: 97597 - DEBRIDE WOUND 1ST 20 SQ CM OR < ICD-10 Diagnosis  Description L97.518 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 sever L97.528 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

## 2023-05-17 NOTE — Progress Notes (Signed)
HAYA, RINDAL R ([id number]) [id number] Page 1 of 13 Visit Report for 05/17/2023 Arrival Information Details Patient Name: Date of Service: [name] NDRA R. 05/17/2023 9:45 A M Medical Record Number: [id number] Patient Account Number: [id number] Date of Birth/Sex: Treating RN: Mar 21, 1986 (37 y.o. Orville Govern Primary Care Tylan Briguglio: Sanda Linger Other Clinician: Referring Jalil Lorusso: Treating Cass Vandermeulen/Extender: Madelyn Flavors in Treatment: 7 Visit Information History Since Last Visit Added or deleted any medications: No Patient Arrived: Ambulatory Any new allergies or adverse reactions: No Arrival Time: 10:00 Had a fall or experienced change in No Accompanied By: Self activities of daily living that may affect Transfer Assistance: None risk of falls: Patient Identification Verified: Yes Signs or symptoms of abuse/neglect since last visito No Secondary Verification Process Completed: Yes Hospitalized since last visit: No Patient Requires Transmission-Based Precautions: No Implantable device outside of the clinic excluding No Patient Has Alerts: No cellular tissue based products placed in the center since last visit: Has Dressing in Place as Prescribed: Yes Pain Present Now: No Electronic Signature(s) Signed: 05/17/2023 5:15:15 PM By: Redmond Pulling RN, BSN Entered By: Redmond Pulling on 05/17/2023 07:01:27 -------------------------------------------------------------------------------- Encounter Discharge Information Details Patient Name: Date of Service: [name] NDRA R. 05/17/2023 9:45 A M Medical Record Number: [id number] Patient Account Number: [id number] Date of Birth/Sex: Treating RN: 1986-06-04 (37 y.o. Orville Govern Primary Care Emerick Weatherly: Sanda Linger Other Clinician: Referring Annora Guderian: Treating Nikolaj Geraghty/Extender: Madelyn Flavors in Treatment: 7 Encounter Discharge Information  Items Post Procedure Vitals Discharge Condition: Stable Temperature (F): 98.1 Ambulatory Status: Ambulatory Pulse (bpm): 120 Discharge Destination: Home Respiratory Rate (breaths/min): 18 Transportation: Private Auto Blood Pressure (mmHg): 160/101 Accompanied By: self Schedule Follow-up Appointment: Yes Clinical Summary of Care: Patient Declined Electronic Signature(s) Signed: 05/17/2023 5:15:15 PM By: Redmond Pulling RN, BSN Entered By: Redmond Pulling on 05/17/2023 11:45:59 Hillis Range, Lenord Carbo ([id number]) [id number] Page 2 of 13 -------------------------------------------------------------------------------- Lower Extremity Assessment Details Patient Name: Date of Service: [name] NDRA R. 05/17/2023 9:45 A M Medical Record Number: [id number] Patient Account Number: [id number] Date of Birth/Sex: Treating RN: 1986-04-26 (37 y.o. Orville Govern Primary Care Stirling Orton: Sanda Linger Other Clinician: Referring Banyan Goodchild: Treating Jahnya Trindade/Extender: Madelyn Flavors in Treatment: 7 Edema Assessment Assessed: Kyra Searles: No] Franne Forts: No] Edema: [Left: Yes] [Right: Yes] Calf Left: Right: Point of Measurement: 39 cm From Medial Instep 49 cm 47.5 cm Ankle Left: Right: Point of Measurement: 9 cm From Medial Instep 24.5 cm 24.3 cm Vascular Assessment Pulses: Dorsalis Pedis Palpable: [Left:Yes] [Right:Yes] Extremity colors, hair growth, and conditions: Extremity Color: [Left:Hyperpigmented] [Right:Hyperpigmented] Hair Growth on Extremity: [Left:No] [Right:No] Temperature of Extremity: [Left:Warm] [Right:Warm] Capillary Refill: [Left:< 3 seconds] [Right:< 3 seconds] Dependent Rubor: [Left:No No] [Right:No No] Electronic Signature(s) Signed: 05/17/2023 5:15:15 PM By: Redmond Pulling RN, BSN Entered By: Redmond Pulling on 05/17/2023 07:07:20 -------------------------------------------------------------------------------- Multi Wound Chart  Details Patient Name: Date of Service: [name] NDRA R. 05/17/2023 9:45 A M Medical Record Number: [id number] Patient Account Number: [id number] Date of Birth/Sex: Treating RN: [birth year] (37 y.o. F) Primary Care Roselin Wiemann: Sanda Linger Other Clinician: Referring Crystalann Korf: Treating Evita Merida/Extender: Madelyn Flavors in Treatment: 7 Vital Signs Height(in): 69 Pulse(bpm): 120 Weight(lbs): 272 Blood Pressure(mmHg): 160/101 Body Mass Index(BMI): 40.2 Temperature(F): 98.1 Respiratory Rate(breaths/min): 18 [1:Photos:] [phone].pdf Page 3 of 13] Left, Medial Foot Left, Distal Foot Left, Proximal, Dorsal Foot Wound Location: Gradually Appeared Gradually Appeared Gradually Appeared Wounding Event: Lymphedema Lymphedema Lymphedema Primary Etiology: Anemia,  Sleep Apnea, Vasculitis, Anemia, Sleep Apnea, Vasculitis, Anemia, Sleep Apnea, Vasculitis, Comorbid History: Neuropathy Neuropathy Neuropathy 03/09/2023 03/09/2023 03/09/2023 Date Acquired: 7 7 7  Weeks of Treatment: Open Open Open Wound Status: No No No Wound Recurrence: Yes Yes Yes Clustered Wound: 1 1 3  Clustered Quantity: 0.2x0.2x0.2 1.4x2x0.1 0.4x0.4x0.1 Measurements L x W x D (cm) 0.031 2.199 0.126 A (cm) : rea 0.006 0.22 0.013 Volume (cm) : 100.00% 90.70% 99.20% % Reduction in A rea: 99.90% 90.70% 99.60% % Reduction in Volume: Partial Thickness Full Thickness Without Exposed Partial Thickness Classification: Support Structures Small Medium Small Exudate A mount: Serosanguineous Serosanguineous Serosanguineous Exudate Type: red, brown red, brown red, brown Exudate Color: Small (1-33%) Large (67-100%) Large (67-100%) Granulation A mount: N/A Red, Pink Pink Granulation Quality: Large (67-100%) None Present (0%) Small (1-33%) Necrotic A mount: Eschar, Adherent Slough N/A Eschar, Adherent Slough Necrotic Tissue: Fat Layer (Subcutaneous Tissue): Yes Fat Layer  (Subcutaneous Tissue): Yes Fat Layer (Subcutaneous Tissue): Yes Exposed Structures: Fascia: No Fascia: No Fascia: No Tendon: No Tendon: No Tendon: No Muscle: No Muscle: No Muscle: No Joint: No Joint: No Joint: No Bone: No Bone: No Bone: No Large (67-100%) Medium (34-66%) None Epithelialization: Debridement - Selective/Open Wound Debridement - Selective/Open Wound Debridement - Selective/Open Wound Debridement: Pre-procedure Verification/Time Out 10:20 10:20 10:20 Taken: Lidocaine 4% Topical Solution Lidocaine 4% Topical Solution Lidocaine 4% Topical Solution Pain Control: Principal Financial Tissue Debrided: Non-Viable Tissue Non-Viable Tissue Non-Viable Tissue Level: 0.03 2.2 0.13 Debridement A (sq cm): rea Curette Curette Curette Instrument: Minimum Minimum Minimum Bleeding: Pressure Pressure Pressure Hemostasis A chieved: Procedure was tolerated well Procedure was tolerated well Procedure was tolerated well Debridement Treatment Response: 0.2x0.2x0.2 1.4x2x0.1 0.4x0.4x0.1 Post Debridement Measurements L x W x D (cm) 0.006 0.22 0.013 Post Debridement Volume: (cm) Excoriation: No Excoriation: No Excoriation: No Periwound Skin Texture: Induration: No Induration: No Induration: No Callus: No Callus: No Callus: No Crepitus: No Crepitus: No Crepitus: No Rash: No Rash: No Rash: No Scarring: No Scarring: No Scarring: No Maceration: No Maceration: No Maceration: No Periwound Skin Moisture: Dry/Scaly: No Dry/Scaly: No Dry/Scaly: No Atrophie Blanche: No Atrophie Blanche: No Atrophie Blanche: No Periwound Skin Color: Cyanosis: No Cyanosis: No Cyanosis: No Ecchymosis: No Ecchymosis: No Ecchymosis: No Erythema: No Erythema: No Erythema: No Hemosiderin Staining: No Hemosiderin Staining: No Hemosiderin Staining: No Mottled: No Mottled: No Mottled: No Pallor: No Pallor: No Pallor: No Rubor: No Rubor: No Rubor: No No Abnormality No  Abnormality N/A Temperature: Debridement Debridement Debridement Procedures Performed: Wound Number: 4 N/A N/A Photos: N/A N/A Right, Dorsal Foot N/A N/A Wound Location: Gradually Appeared N/A N/A Wounding EventMADYSEN, RADHAKRISHNAN R ([id number]) [id number] Page 4 of 13 Lymphedema N/A N/A Primary Etiology: Anemia, Sleep Apnea, Vasculitis, N/A N/A Comorbid History: Neuropathy 03/09/2023 N/A N/A Date A cquired: 7 N/A N/A Weeks of Treatment: Open N/A N/A Wound Status: No N/A N/A Wound Recurrence: Yes N/A N/A Clustered Wound: 4 N/A N/A Clustered Quantity: 4.5x4.5x0.2 N/A N/A Measurements L x W x D (cm) 15.904 N/A N/A A (cm) : rea 3.181 N/A N/A Volume (cm) : 57.80% N/A N/A % Reduction in A rea: 15.60% N/A N/A % Reduction in Volume: Full Thickness Without Exposed N/A N/A Classification: Support Structures Medium N/A N/A Exudate A mount: Serosanguineous N/A N/A Exudate Type: red, brown N/A N/A Exudate Color: Medium (34-66%) N/A N/A Granulation A mount: Red, Pink N/A N/A Granulation Quality: Medium (34-66%) N/A N/A Necrotic A mount: Adherent Slough N/A N/A Necrotic Tissue: Fat Layer (Subcutaneous Tissue): Yes N/A  N/A Exposed Structures: Fascia: No Tendon: No Muscle: No Joint: No Bone: No Small (1-33%) N/A N/A Epithelialization: N/A N/A N/A Debridement: N/A N/A N/A Pain Control: N/A N/A N/A Tissue Debrided: N/A N/A N/A Level: N/A N/A N/A Debridement A (sq cm): rea N/A N/A N/A Instrument: N/A N/A N/A Bleeding: N/A N/A N/A Hemostasis A chieved: Debridement Treatment Response: N/A N/A N/A Post Debridement Measurements L x N/A N/A N/A W x D (cm) N/A N/A N/A Post Debridement Volume: (cm) Excoriation: No N/A N/A Periwound Skin Texture: Induration: No Callus: No Crepitus: No Rash: No Scarring: No Maceration: No N/A N/A Periwound Skin Moisture: Dry/Scaly: No Atrophie Blanche: No N/A N/A Periwound Skin  Color: Cyanosis: No Ecchymosis: No Erythema: No Hemosiderin Staining: No Mottled: No Pallor: No Rubor: No No Abnormality N/A N/A Temperature: N/A N/A N/A Procedures Performed: Treatment Notes Electronic Signature(s) Signed: 05/17/2023 4:37:17 PM By: Baltazar Najjar MD Entered By: Baltazar Najjar on 05/17/2023 07:26:08 -------------------------------------------------------------------------------- Multi-Disciplinary Care Plan Details Patient Name: Date of Service: [name] NDRA R. 05/17/2023 9:45 A M Medical Record Number: [id number] Patient Account Number: [id number] Date of Birth/Sex: Treating RN: December 27, 1985 (37 y.o. Orville Govern Primary Care Isidore Margraf: Sanda Linger Other Clinician: Referring Messi Twedt: Treating Alyssa Rotondo/Extender: Corbyn, [place], [place] R ([id number]) [id number] Page 5 of 13 Weeks in Treatment: 7 Active Inactive Pain, Acute or Chronic Nursing Diagnoses: Pain, acute or chronic: actual or potential Potential alteration in comfort, pain Goals: Patient will verbalize adequate pain control and receive pain control interventions during procedures as needed Date Initiated: 03/23/2023 Target Resolution Date: 06/04/2023 Goal Status: Active Patient/caregiver will verbalize comfort level met Date Initiated: 03/23/2023 Date Inactivated: 04/13/2023 Target Resolution Date: 04/13/2023 Goal Status: Met Interventions: Encourage patient to take pain medications as prescribed Provide education on pain management Provision of support: recognize patient pain, provide comfort and support as needed Treatment Activities: Administer pain control measures as ordered : 03/23/2023 Notes: Wound/Skin Impairment Nursing Diagnoses: Knowledge deficit related to ulceration/compromised skin integrity Goals: Patient/caregiver will verbalize understanding of skin care regimen Date Initiated: 03/23/2023 Target Resolution Date:  06/04/2023 Goal Status: Active Interventions: Assess patient/caregiver ability to obtain necessary supplies Assess patient/caregiver ability to perform ulcer/skin care regimen upon admission and as needed Provide education on ulcer and skin care Treatment Activities: Skin care regimen initiated : 03/23/2023 Topical wound management initiated : 03/23/2023 Notes: Electronic Signature(s) Signed: 05/17/2023 5:15:15 PM By: Redmond Pulling RN, BSN Entered By: Redmond Pulling on 05/17/2023 07:20:11 -------------------------------------------------------------------------------- Pain Assessment Details Patient Name: Date of Service: [name] NDRA R. 05/17/2023 9:45 A M Medical Record Number: [id number] Patient Account Number: [id number] Date of Birth/Sex: Treating RN: 1985-12-17 (37 y.o. Orville Govern Primary Care Shin Lamour: Sanda Linger Other Clinician: Referring Stacie Templin: Treating Imanuel Pruiett/Extender: Madelyn Flavors in Treatment: 7 Active Problems Location of Pain Severity and Description of Pain Patient Has Paino No ALEASHA, FELLING R ([id number]) [id number] Page 6 of 13 Patient Has Paino No Site Locations Pain Management and Medication Current Pain Management: Electronic Signature(s) Signed: 05/17/2023 5:15:15 PM By: Redmond Pulling RN, BSN Entered By: Redmond Pulling on 05/17/2023 07:02:17 -------------------------------------------------------------------------------- Patient/Caregiver Education Details Patient Name: Date of Service: [name] NDRA R. 11/11/2024andnbsp9:45 A M Medical Record Number: [id number] Patient Account Number: [id number] Date of Birth/Gender: Treating RN: 1986-04-03 (37 y.o. Orville Govern Primary Care Physician: Sanda Linger Other Clinician: Referring Physician: Treating Physician/Extender: Madelyn Flavors in Treatment: 7 Education Assessment Education Provided  To: Patient Education Topics Provided  Wound/Skin Impairment: Methods: Explain/Verbal Responses: State content correctly Electronic Signature(s) Signed: 05/17/2023 5:15:15 PM By: Redmond Pulling RN, BSN Entered By: Redmond Pulling on 05/17/2023 07:20:39 -------------------------------------------------------------------------------- Wound Assessment Details Patient Name: Date of Service: [name] NDRA R. 05/17/2023 9:45 A M Medical Record Number: [id number] Patient Account Number: [id number] Date of Birth/Sex: Treating RN: 1986/01/14 (37 y.o. [address] [place] R ([id number]) [id number] Page 7 of 13 Primary Care Lennart Gladish: Sanda Linger Other Clinician: Referring Haelee Bolen: Treating Tatem Holsonback/Extender: Madelyn Flavors in Treatment: 7 Wound Status Wound Number: 1 Primary Etiology: Lymphedema Wound Location: Left, Medial Foot Wound Status: Open Wounding Event: Gradually Appeared Comorbid History: Anemia, Sleep Apnea, Vasculitis, Neuropathy Date Acquired: 03/09/2023 Weeks Of Treatment: 7 Clustered Wound: Yes Photos Wound Measurements Length: (cm) Width: (cm) Depth: (cm) Clustered Quantity: Area: (cm) Volume: (cm) 0.2 % Reduction in Area: 100% 0.2 % Reduction in Volume: 99.9% 0.2 Epithelialization: Large (67-100%) 1 Tunneling: No 0.031 Undermining: No 0.006 Wound Description Classification: Partial Thickness Exudate Amount: Small Exudate Type: Serosanguineous Exudate Color: red, brown Foul Odor After Cleansing: No Slough/Fibrino Yes Wound Bed Granulation Amount: Small (1-33%) Exposed Structure Necrotic Amount: Large (67-100%) Fascia Exposed: No Necrotic Quality: Eschar, Adherent Slough Fat Layer (Subcutaneous Tissue) Exposed: Yes Tendon Exposed: No Muscle Exposed: No Joint Exposed: No Bone Exposed: No Periwound Skin Texture Texture Color No Abnormalities Noted: No No Abnormalities Noted: No Callus:  No Atrophie Blanche: No Crepitus: No Cyanosis: No Excoriation: No Ecchymosis: No Induration: No Erythema: No Rash: No Hemosiderin Staining: No Scarring: No Mottled: No Pallor: No Moisture Rubor: No No Abnormalities Noted: No Dry / Scaly: No Temperature / Pain Maceration: No Temperature: No Abnormality Treatment Notes Wound #1 (Foot) Wound Laterality: Left, Medial Cleanser Soap and Water Discharge Instruction: May shower and wash wound with dial antibacterial soap and water prior to dressing change. Vashe 5.8 (oz) Discharge Instruction: Cleanse the wound with Vashe prior to applying a clean dressing using gauze sponges, not tissue or cotton balls. DELISHA, PATIL R ([id number]) [id number] Page 8 of 13 Peri-Wound Care Sween Lotion (Moisturizing lotion) Discharge Instruction: Apply moisturizing lotion as directed Topical Primary Dressing Hydrofera Blue Ready Transfer Foam, 2.5x2.5 (in/in) Discharge Instruction: Apply directly to wound bed as directed Santyl Ointment Discharge Instruction: Apply nickel thick amount to wound bed as instructed Secondary Dressing ABD Pad, 8x10 Discharge Instruction: Apply over primary dressing as directed. Woven Gauze Sponge, Non-Sterile 4x4 in Discharge Instruction: Apply over primary dressing as directed. Secured With Compression Wrap Kerlix Roll 4.5x3.1 (in/yd) Discharge Instruction: Apply Kerlix and Coban compression as directed. Coban Self-Adherent Wrap 4x5 (in/yd) Discharge Instruction: Apply over Kerlix as directed. Compression Stockings Add-Ons Electronic Signature(s) Signed: 05/17/2023 5:15:15 PM By: Redmond Pulling RN, BSN Entered By: Redmond Pulling on 05/17/2023 07:10:45 -------------------------------------------------------------------------------- Wound Assessment Details Patient Name: Date of Service: [name] NDRA R. 05/17/2023 9:45 A M Medical Record Number: [id number] Patient Account  Number: [id number] Date of Birth/Sex: Treating RN: [birth year] (37 y.o. Orville Govern Primary Care Taiwan Talcott: Sanda Linger Other Clinician: Referring Karalyn Kadel: Treating Khris Jansson/Extender: Madelyn Flavors in Treatment: 7 Wound Status Wound Number: 2 Primary Etiology: Lymphedema Wound Location: Left, Distal Foot Wound Status: Open Wounding Event: Gradually Appeared Comorbid History: Anemia, Sleep Apnea, Vasculitis, Neuropathy Date Acquired: 03/09/2023 Weeks Of Treatment: 7 Clustered Wound: Yes Photos Wound Measurements AROOSH, VESTAL R ([id number]) Length: (cm) 1.4 Width: (cm) 2 Depth: (cm) 0.1 Clustered Quantity: 1 Area: (cm) 2.199 Volume: (cm) 0.22 [id number] Page 9 of 13 % Reduction  in Area: 90.7% % Reduction in Volume: 90.7% Epithelialization: Medium (34-66%) Tunneling: No Undermining: No Wound Description Classification: Full Thickness Without Exposed Support Structures Exudate Amount: Medium Exudate Type: Serosanguineous Exudate Color: red, brown Foul Odor After Cleansing: No Slough/Fibrino Yes Wound Bed Granulation Amount: Large (67-100%) Exposed Structure Granulation Quality: Red, Pink Fascia Exposed: No Necrotic Amount: None Present (0%) Fat Layer (Subcutaneous Tissue) Exposed: Yes Tendon Exposed: No Muscle Exposed: No Joint Exposed: No Bone Exposed: No Periwound Skin Texture Texture Color No Abnormalities Noted: No No Abnormalities Noted: No Callus: No Atrophie Blanche: No Crepitus: No Cyanosis: No Excoriation: No Ecchymosis: No Induration: No Erythema: No Rash: No Hemosiderin Staining: No Scarring: No Mottled: No Pallor: No Moisture Rubor: No No Abnormalities Noted: No Dry / Scaly: No Temperature / Pain Maceration: No Temperature: No Abnormality Treatment Notes Wound #2 (Foot) Wound Laterality: Left, Distal Cleanser Soap and Water Discharge Instruction: May shower and wash wound with  dial antibacterial soap and water prior to dressing change. Vashe 5.8 (oz) Discharge Instruction: Cleanse the wound with Vashe prior to applying a clean dressing using gauze sponges, not tissue or cotton balls. Peri-Wound Care Sween Lotion (Moisturizing lotion) Discharge Instruction: Apply moisturizing lotion as directed Topical Primary Dressing Hydrofera Blue Ready Transfer Foam, 2.5x2.5 (in/in) Discharge Instruction: Apply directly to wound bed as directed Santyl Ointment Discharge Instruction: Apply nickel thick amount to wound bed as instructed Secondary Dressing ABD Pad, 8x10 Discharge Instruction: Apply over primary dressing as directed. Woven Gauze Sponge, Non-Sterile 4x4 in Discharge Instruction: Apply over primary dressing as directed. Secured With Compression Wrap Kerlix Roll 4.5x3.1 (in/yd) Discharge Instruction: Apply Kerlix and Coban compression as directed. Coban Self-Adherent Wrap 4x5 (in/yd) Discharge Instruction: Apply over Kerlix as directed. Compression Stockings MAKHILA, PALADINO R ([id number]) [id number] Page 10 of 13 Add-Ons Electronic Signature(s) Signed: 05/17/2023 5:15:15 PM By: Redmond Pulling RN, BSN Entered By: Redmond Pulling on 05/17/2023 07:12:01 -------------------------------------------------------------------------------- Wound Assessment Details Patient Name: Date of Service: [name] NDRA R. 05/17/2023 9:45 A M Medical Record Number: [id number] Patient Account Number: [id number] Date of Birth/Sex: Treating RN: [birth year] (37 y.o. Orville Govern Primary Care Christe Tellez: Sanda Linger Other Clinician: Referring Juwaun Inskeep: Treating Arohi Salvatierra/Extender: Madelyn Flavors in Treatment: 7 Wound Status Wound Number: 3 Primary Etiology: Lymphedema Wound Location: Left, Proximal, Dorsal Foot Wound Status: Open Wounding Event: Gradually Appeared Comorbid History: Anemia, Sleep Apnea, Vasculitis,  Neuropathy Date Acquired: 03/09/2023 Weeks Of Treatment: 7 Clustered Wound: Yes Photos Wound Measurements Length: (cm) Width: (cm) Depth: (cm) Clustered Quantity: Area: (cm) Volume: (cm) 0.4 % Reduction in Area: 99.2% 0.4 % Reduction in Volume: 99.6% 0.1 Epithelialization: None 3 Tunneling: No 0.126 Undermining: No 0.013 Wound Description Classification: Partial Thickness Exudate Amount: Small Exudate Type: Serosanguineous Exudate Color: red, brown Foul Odor After Cleansing: No Slough/Fibrino Yes Wound Bed Granulation Amount: Large (67-100%) Exposed Structure Granulation Quality: Pink Fascia Exposed: No Necrotic Amount: Small (1-33%) Fat Layer (Subcutaneous Tissue) Exposed: Yes Necrotic Quality: Eschar, Adherent Slough Tendon Exposed: No Muscle Exposed: No Joint Exposed: No Bone Exposed: No Periwound Skin Texture Texture Color No Abnormalities Noted: No No Abnormalities Noted: No Callus: No Atrophie Blanche: No Crepitus: No Cyanosis: No TANICIA, WEATHERWAX R ([id number]) [id number] Page 11 of 13 Excoriation: No Ecchymosis: No Induration: No Erythema: No Rash: No Hemosiderin Staining: No Scarring: No Mottled: No Pallor: No Moisture Rubor: No No Abnormalities Noted: No Dry / Scaly: No Maceration: No Treatment Notes Wound #3 (Foot) Wound Laterality: Dorsal, Left, Proximal Cleanser Soap and Water Discharge Instruction:  May shower and wash wound with dial antibacterial soap and water prior to dressing change. Vashe 5.8 (oz) Discharge Instruction: Cleanse the wound with Vashe prior to applying a clean dressing using gauze sponges, not tissue or cotton balls. Peri-Wound Care Sween Lotion (Moisturizing lotion) Discharge Instruction: Apply moisturizing lotion as directed Topical Primary Dressing Hydrofera Blue Ready Transfer Foam, 2.5x2.5 (in/in) Discharge Instruction: Apply directly to wound bed as directed Santyl  Ointment Discharge Instruction: Apply nickel thick amount to wound bed as instructed Secondary Dressing ABD Pad, 8x10 Discharge Instruction: Apply over primary dressing as directed. Woven Gauze Sponge, Non-Sterile 4x4 in Discharge Instruction: Apply over primary dressing as directed. Secured With Compression Wrap Kerlix Roll 4.5x3.1 (in/yd) Discharge Instruction: Apply Kerlix and Coban compression as directed. Coban Self-Adherent Wrap 4x5 (in/yd) Discharge Instruction: Apply over Kerlix as directed. Compression Stockings Add-Ons Electronic Signature(s) Signed: 05/17/2023 5:15:15 PM By: Redmond Pulling RN, BSN Entered By: Redmond Pulling on 05/17/2023 07:13:54 -------------------------------------------------------------------------------- Wound Assessment Details Patient Name: Date of Service: [name] NDRA R. 05/17/2023 9:45 A M Medical Record Number: [id number] Patient Account Number: [id number] Date of Birth/Sex: Treating RN: 03/25/1986 (37 y.o. Orville Govern Primary Care Arneisha Kincannon: Sanda Linger Other Clinician: Referring Arisbeth Purrington: Treating Avel Ogawa/Extender: Madelyn Flavors in Treatment: 7 Wound Status Wound Number: 4 Primary Etiology: Lymphedema Wound Location: Right, Dorsal Foot Wound Status: Open Wounding Event: Gradually Appeared Comorbid History: Anemia, Sleep Apnea, Vasculitis, Neuropathy Enrique, Avyanna R ([id number]) [id number] Page 12 of 13 Date Acquired: 03/09/2023 Weeks Of Treatment: 7 Clustered Wound: Yes Photos Wound Measurements Length: (cm) 4.5 Width: (cm) 4.5 Depth: (cm) 0.2 Clustered Quantity: 4 Area: (cm) 15.904 Volume: (cm) 3.181 % Reduction in Area: 57.8% % Reduction in Volume: 15.6% Epithelialization: Small (1-33%) Tunneling: No Undermining: No Wound Description Classification: Full Thickness Without Exposed Support Structures Exudate Amount: Medium Exudate Type:  Serosanguineous Exudate Color: red, brown Foul Odor After Cleansing: No Slough/Fibrino Yes Wound Bed Granulation Amount: Medium (34-66%) Exposed Structure Granulation Quality: Red, Pink Fascia Exposed: No Necrotic Amount: Medium (34-66%) Fat Layer (Subcutaneous Tissue) Exposed: Yes Necrotic Quality: Adherent Slough Tendon Exposed: No Muscle Exposed: No Joint Exposed: No Bone Exposed: No Periwound Skin Texture Texture Color No Abnormalities Noted: No No Abnormalities Noted: No Callus: No Atrophie Blanche: No Crepitus: No Cyanosis: No Excoriation: No Ecchymosis: No Induration: No Erythema: No Rash: No Hemosiderin Staining: No Scarring: No Mottled: No Pallor: No Moisture Rubor: No No Abnormalities Noted: No Dry / Scaly: No Temperature / Pain Maceration: No Temperature: No Abnormality Treatment Notes Wound #4 (Foot) Wound Laterality: Dorsal, Right Cleanser Soap and Water Discharge Instruction: May shower and wash wound with dial antibacterial soap and water prior to dressing change. Vashe 5.8 (oz) Discharge Instruction: Cleanse the wound with Vashe prior to applying a clean dressing using gauze sponges, not tissue or cotton balls. Peri-Wound Care Sween Lotion (Moisturizing lotion) Discharge Instruction: Apply moisturizing lotion as directed Topical Primary Dressing [address] Ready Transfer Foam, 2.5x2.5 (in/in) Soulliere, Glendell R ([id number]) [id number] Page 13 of 13 Discharge Instruction: Apply directly to wound bed as directed Santyl Ointment Discharge Instruction: Apply nickel thick amount to wound bed as instructed Secondary Dressing ABD Pad, 8x10 Discharge Instruction: Apply over primary dressing as directed. Woven Gauze Sponge, Non-Sterile 4x4 in Discharge Instruction: Apply over primary dressing as directed. Secured With Compression Wrap Kerlix Roll 4.5x3.1 (in/yd) Discharge Instruction: Apply Kerlix and Coban  compression as directed. Coban Self-Adherent Wrap 4x5 (in/yd) Discharge Instruction: Apply over Kerlix as directed. Compression Stockings Add-Ons Electronic  Signature(s) Signed: 05/17/2023 5:15:15 PM By: Redmond Pulling RN, BSN Entered By: Redmond Pulling on 05/17/2023 07:15:42 -------------------------------------------------------------------------------- Vitals Details Patient Name: Date of Service: [name] NDRA R. 05/17/2023 9:45 A M Medical Record Number: [id number] Patient Account Number: [id number] Date of Birth/Sex: Treating RN: Apr 19, 1986 (37 y.o. Orville Govern Primary Care Niti Leisure: Sanda Linger Other Clinician: Referring Lasean Gorniak: Treating Emira Eubanks/Extender: Madelyn Flavors in Treatment: 7 Vital Signs Time Taken: 10:00 Temperature (F): 98.1 Height (in): 69 Pulse (bpm): 120 Weight (lbs): 272 Respiratory Rate (breaths/min): 18 Body Mass Index (BMI): 40.2 Blood Pressure (mmHg): 160/101 Reference Range: 80 - 120 mg / dl Electronic Signature(s) Signed: 05/17/2023 5:15:15 PM By: Redmond Pulling RN, BSN Entered By: Redmond Pulling on 05/17/2023 07:02:08

## 2023-05-21 DIAGNOSIS — E669 Obesity, unspecified: Secondary | ICD-10-CM | POA: Diagnosis not present

## 2023-05-21 DIAGNOSIS — G4700 Insomnia, unspecified: Secondary | ICD-10-CM | POA: Diagnosis not present

## 2023-05-21 DIAGNOSIS — F411 Generalized anxiety disorder: Secondary | ICD-10-CM | POA: Diagnosis not present

## 2023-05-21 DIAGNOSIS — F339 Major depressive disorder, recurrent, unspecified: Secondary | ICD-10-CM | POA: Diagnosis not present

## 2023-05-24 ENCOUNTER — Encounter (HOSPITAL_BASED_OUTPATIENT_CLINIC_OR_DEPARTMENT_OTHER): Payer: BC Managed Care – PPO | Admitting: Internal Medicine

## 2023-05-24 DIAGNOSIS — L97518 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 severity: Secondary | ICD-10-CM | POA: Diagnosis not present

## 2023-05-24 DIAGNOSIS — G4730 Sleep apnea, unspecified: Secondary | ICD-10-CM | POA: Diagnosis not present

## 2023-05-24 DIAGNOSIS — I890 Lymphedema, not elsewhere classified: Secondary | ICD-10-CM | POA: Diagnosis not present

## 2023-05-24 DIAGNOSIS — E538 Deficiency of other specified B group vitamins: Secondary | ICD-10-CM | POA: Diagnosis not present

## 2023-05-24 DIAGNOSIS — I10 Essential (primary) hypertension: Secondary | ICD-10-CM | POA: Diagnosis not present

## 2023-05-24 DIAGNOSIS — L97528 Non-pressure chronic ulcer of other part of left foot with other specified severity: Secondary | ICD-10-CM | POA: Diagnosis not present

## 2023-05-24 DIAGNOSIS — G8929 Other chronic pain: Secondary | ICD-10-CM | POA: Diagnosis not present

## 2023-05-24 DIAGNOSIS — I872 Venous insufficiency (chronic) (peripheral): Secondary | ICD-10-CM | POA: Diagnosis not present

## 2023-05-24 DIAGNOSIS — D649 Anemia, unspecified: Secondary | ICD-10-CM | POA: Diagnosis not present

## 2023-05-25 NOTE — Progress Notes (Signed)
[name], [name] ([id number]) [id number] Page 1 of 17 Visit Report for 05/24/2023 Arrival Information Details Patient Name: Date of Service: [name] [name]. 05/24/2023 8:15 A M Medical Record Number: [id number] Patient Account Number: [id number] Date of Birth/Sex: Treating RN: [birth year] (37 y.o. [name] Primary Care [name]: [name] Other Clinician: Referring [name]: Treating [name]/Extender: [name] in Treatment: 8 Visit Information History Since Last Visit Added or deleted any medications: No Patient Arrived: Ambulatory Any new allergies or adverse reactions: No Arrival Time: 08:28 Had a fall or experienced change in No Accompanied By: friend activities of daily living that may affect Transfer Assistance: None risk of falls: Patient Identification Verified: Yes Signs or symptoms of abuse/neglect since last visito No Patient Requires Transmission-Based Precautions: No Hospitalized since last visit: No Patient Has Alerts: No Implantable device outside of the clinic excluding No cellular tissue based products placed in the center since last visit: Has Dressing in Place as Prescribed: Yes Pain Present Now: Yes Electronic Signature(s) Signed: 05/25/2023 2:07:28 PM By: Karie Schwalbe RN Entered By: Karie Schwalbe on 05/24/2023 08:29:26 -------------------------------------------------------------------------------- Clinic Level of Care Assessment Details Patient Name: Date of Service: [name] MMA [name]. 05/24/2023 8:15 A M Medical Record Number: [id number] Patient Account Number: [id number] Date of Birth/Sex: Treating RN: 1985-12-30 (37 y.o. [name] Primary Care [name]: [name] Other Clinician: Referring [name]: Treating [name]/Extender: [name] in Treatment: 8 Clinic Level of Care Assessment Items TOOL 4 Quantity Score X- 1 0 Use when  only an EandM is performed on FOLLOW-UP visit ASSESSMENTS - Nursing Assessment / Reassessment X- 1 10 Reassessment of Co-morbidities (includes updates in patient status) X- 1 5 Reassessment of Adherence to Treatment Plan ASSESSMENTS - Wound and Skin A ssessment / Reassessment []  - 0 Simple Wound Assessment / Reassessment - one wound X- 4 5 Complex Wound Assessment / Reassessment - multiple wounds X- 1 10 Dermatologic / Skin Assessment (not related to wound area) ASSESSMENTS - Focused Assessment X- 1 5 Circumferential Edema Measurements - multi extremities []  - 0 Nutritional Assessment / Counseling / Intervention [name], [name] ([id number]) [id number] Page 2 of 17 []  - 0 Lower Extremity Assessment (monofilament, tuning fork, pulses) []  - 0 Peripheral Arterial Disease Assessment (using hand held doppler) ASSESSMENTS - Ostomy and/or Continence Assessment and Care []  - 0 Incontinence Assessment and Management []  - 0 Ostomy Care Assessment and Management (repouching, etc.) PROCESS - Coordination of Care []  - 0 Simple Patient / Family Education for ongoing care X- 1 20 Complex (extensive) Patient / Family Education for ongoing care X- 1 10 Staff obtains Chiropractor, Records, T Results / Process Orders est []  - 0 Staff telephones HHA, Nursing Homes / Clarify orders / etc []  - 0 Routine Transfer to another Facility (non-emergent condition) []  - 0 Routine Hospital Admission (non-emergent condition) []  - 0 New Admissions / Manufacturing engineer / Ordering NPWT Apligraf, etc. , []  - 0 Emergency Hospital Admission (emergent condition) []  - 0 Simple Discharge Coordination X- 1 15 Complex (extensive) Discharge Coordination PROCESS - Special Needs []  - 0 Pediatric / Minor Patient Management []  - 0 Isolation Patient Management []  - 0 Hearing / Language / Visual special needs []  - 0 Assessment of Community assistance (transportation, D/C  planning, etc.) []  - 0 Additional assistance / Altered mentation []  - 0 Support Surface(s) Assessment (bed, cushion, seat, etc.) INTERVENTIONS - Wound Cleansing / Measurement []  - 0 Simple Wound Cleansing -  one wound X- 4 5 Complex Wound Cleansing - multiple wounds X- 1 5 Wound Imaging (photographs - any number of wounds) []  - 0 Wound Tracing (instead of photographs) []  - 0 Simple Wound Measurement - one wound X- 4 5 Complex Wound Measurement - multiple wounds INTERVENTIONS - Wound Dressings []  - 0 Small Wound Dressing one or multiple wounds X- 2 15 Medium Wound Dressing one or multiple wounds []  - 0 Large Wound Dressing one or multiple wounds []  - 0 Application of Medications - topical []  - 0 Application of Medications - injection INTERVENTIONS - Miscellaneous []  - 0 External ear exam []  - 0 Specimen Collection (cultures, biopsies, blood, body fluids, etc.) []  - 0 Specimen(s) / Culture(s) sent or taken to Lab for analysis []  - 0 Patient Transfer (multiple staff / Nurse, adult / Similar devices) []  - 0 Simple Staple / Suture removal (25 or less) []  - 0 Complex Staple / Suture removal (26 or more) []  - 0 Hypo / Hyperglycemic Management (close monitor of Blood Glucose) [name], [name] ([id number]) [id number] Page 3 of 17 []  - 0 Ankle / Brachial Index (ABI) - do not check if billed separately X- 1 5 Vital Signs Has the patient been seen at the hospital within the last three years: Yes Total Score: 175 Level Of Care: New/Established - Level 5 Electronic Signature(s) Signed: 05/24/2023 5:09:00 PM By: [name] Stall RN, BSN Entered By: [name] on 05/24/2023 09:15:15 -------------------------------------------------------------------------------- Encounter Discharge Information Details Patient Name: Date of Service: [name] MMA [name]. 05/24/2023 8:15 A M Medical Record Number: [id number] Patient Account Number: [id number] Date of  Birth/Sex: Treating RN: 01/13/1986 (37 y.o. [name] Primary Care [name]: [name] Other Clinician: Referring Tylin Force: Treating Brayley Mackowiak/Extender: [name] in Treatment: 8 Encounter Discharge Information Items Discharge Condition: Stable Ambulatory Status: Ambulatory Discharge Destination: Home Transportation: Private Auto Accompanied By: mother Schedule Follow-up Appointment: Yes Clinical Summary of Care: Electronic Signature(s) Signed: 05/24/2023 5:09:00 PM By: [name] Stall RN, BSN Entered By: [name] on 05/24/2023 09:15:45 -------------------------------------------------------------------------------- Lower Extremity Assessment Details Patient Name: Date of Service: [name] MMA [name]. 05/24/2023 8:15 A M Medical Record Number: [id number] Patient Account Number: [id number] Date of Birth/Sex: Treating RN: Nov 17, 1985 (37 y.o. [name] Primary Care Cloris Flippo: [name] Other Clinician: Referring Temitayo Covalt: Treating Arleen Bar/Extender: [name] in Treatment: 8 Edema Assessment Assessed: Kyra Searles: No] Franne Forts: No] Edema: [Left: Yes] [Right: Yes] Calf Left: Right: Point of Measurement: 39 cm From Medial Instep 48.5 cm 45 cm Ankle Left: Right: Point of Measurement: 9 cm From Medial Instep 25 cm 24.8 cm Vascular Assessment [name], [name] ([id number]) [Right:[id number] Page 4 of 17] Pulses: Dorsalis Pedis Palpable: [Left:Yes] [Right:Yes] Posterior Tibial Palpable: [Left:Yes] [Right:Yes] Extremity colors, hair growth, and conditions: Extremity Color: [Left:Hyperpigmented] [Right:Hyperpigmented] Hair Growth on Extremity: [Left:No] [Right:No] Temperature of Extremity: [Left:Warm] [Right:Warm] Capillary Refill: [Left:< 3 seconds] [Right:< 3 seconds] Dependent Rubor: [Left:No No] [Right:No No] Electronic Signature(s) Signed: 05/25/2023 2:07:28 PM By: Karie Schwalbe RN Entered By: Karie Schwalbe on 05/24/2023 08:35:21 -------------------------------------------------------------------------------- Multi Wound Chart Details Patient Name: Date of Service: [name] MMA [name]. 05/24/2023 8:15 A M Medical Record Number: [id number] Patient Account Number: [id number] Date of Birth/Sex: Treating RN: October 07, 1985 (37 y.o. F) Primary Care Vivan Agostino: [name] Other Clinician: Referring Glendoris Nodarse: Treating Kristle Wesch/Extender: [name] in Treatment: 8 Vital Signs Height(in): 69 Pulse(bpm): 77 Weight(lbs): 272 Blood Pressure(mmHg): 166/117 Body Mass Index(BMI): 40.2 Temperature(F):  98.8 Respiratory Rate(breaths/min): 18 [1:Photos:] Left, Medial Foot Left, Distal Foot Left, Proximal, Dorsal Foot Wound Location: Gradually Appeared Gradually Appeared Gradually Appeared Wounding Event: Lymphedema Lymphedema Lymphedema Primary Etiology: Anemia, Sleep Apnea, Vasculitis, Anemia, Sleep Apnea, Vasculitis, Anemia, Sleep Apnea, Vasculitis, Comorbid History: Neuropathy Neuropathy Neuropathy 03/09/2023 03/09/2023 03/09/2023 Date Acquired: 8 8 8  Weeks of Treatment: Open Open Open Wound Status: No No No Wound Recurrence: Yes Yes Yes Clustered Wound: 1 1 3  Clustered Quantity: 0.3x0.3x0.1 1.2x2x0.1 0.4x0.4x0.1 Measurements L x W x D (cm) 0.071 1.885 0.126 A (cm) : rea 0.007 0.188 0.013 Volume (cm) : 99.90% 92.00% 99.20% % Reduction in Area: 99.90% 92.00% 99.60% % Reduction in Volume: Partial Thickness Full Thickness Without Exposed Partial Thickness Classification: Support Structures Small Medium Small Exudate A mount: Serosanguineous Serosanguineous Serosanguineous Exudate Type: red, brown red, brown red, brown Exudate Color: Small (1-33%) Large (67-100%) Large (67-100%) Granulation Amount: N/A Red, Pink Pink Granulation Quality: Large (67-100%) None Present (0%) Small (1-33%) Necrotic Amount: Eschar, Adherent  Slough N/A Eschar, Adherent Slough Necrotic TissueLOREEN, [name] ([id number]) [id number] Page 5 of 17 Fat Layer (Subcutaneous Tissue): Yes Fat Layer (Subcutaneous Tissue): Yes Fat Layer (Subcutaneous Tissue): Yes Exposed Structures: Fascia: No Fascia: No Fascia: No Tendon: No Tendon: No Tendon: No Muscle: No Muscle: No Muscle: No Joint: No Joint: No Joint: No Bone: No Bone: No Bone: No Large (67-100%) Medium (34-66%) None Epithelialization: Excoriation: No Excoriation: No Excoriation: No Periwound Skin Texture: Induration: No Induration: No Induration: No Callus: No Callus: No Callus: No Crepitus: No Crepitus: No Crepitus: No Rash: No Rash: No Rash: No Scarring: No Scarring: No Scarring: No Maceration: No Maceration: No Maceration: No Periwound Skin Moisture: Dry/Scaly: No Dry/Scaly: No Dry/Scaly: No Atrophie Blanche: No Atrophie Blanche: No Atrophie Blanche: No Periwound Skin Color: Cyanosis: No Cyanosis: No Cyanosis: No Ecchymosis: No Ecchymosis: No Ecchymosis: No Erythema: No Erythema: No Erythema: No Hemosiderin Staining: No Hemosiderin Staining: No Hemosiderin Staining: No Mottled: No Mottled: No Mottled: No Pallor: No Pallor: No Pallor: No Rubor: No Rubor: No Rubor: No No Abnormality No Abnormality N/A Temperature: Wound Number: 4 N/A N/A Photos: N/A N/A Right, Dorsal Foot N/A N/A Wound Location: Gradually Appeared N/A N/A Wounding Event: Lymphedema N/A N/A Primary Etiology: Anemia, Sleep Apnea, Vasculitis, N/A N/A Comorbid History: Neuropathy 03/09/2023 N/A N/A Date Acquired: 8 N/A N/A Weeks of Treatment: Open N/A N/A Wound Status: No N/A N/A Wound Recurrence: Yes N/A N/A Clustered Wound: 4 N/A N/A Clustered Quantity: 4.7x4.8x0.2 N/A N/A Measurements L x W x D (cm) 17.719 N/A N/A A (cm) : rea 3.544 N/A N/A Volume (cm) : 53.00% N/A N/A % Reduction in Area: 6.00% N/A N/A %  Reduction in Volume: Full Thickness Without Exposed N/A N/A Classification: Support Structures Medium N/A N/A Exudate A mount: Serosanguineous N/A N/A Exudate Type: red, brown N/A N/A Exudate Color: Medium (34-66%) N/A N/A Granulation Amount: Red, Pink N/A N/A Granulation Quality: Medium (34-66%) N/A N/A Necrotic Amount: Adherent Slough N/A N/A Necrotic Tissue: Fat Layer (Subcutaneous Tissue): Yes N/A N/A Exposed Structures: Fascia: No Tendon: No Muscle: No Joint: No Bone: No Small (1-33%) N/A N/A Epithelialization: Excoriation: No N/A N/A Periwound Skin Texture: Induration: No Callus: No Crepitus: No Rash: No Scarring: No Maceration: No N/A N/A Periwound Skin Moisture: Dry/Scaly: No Atrophie Blanche: No N/A N/A Periwound Skin Color: Cyanosis: No Ecchymosis: No Erythema: No Hemosiderin Staining: No Mottled: No Pallor: No Rubor: No No Abnormality N/A N/A TemperatureIBUKUNOLUWA, [name] ([id number]) [id number] Page 6 of 17 Treatment Notes  Wound #1 (Foot) Wound Laterality: Left, Medial Cleanser Soap and Water Discharge Instruction: May shower and wash wound with dial antibacterial soap and water prior to dressing change. Vashe 5.8 (oz) Discharge Instruction: Cleanse the wound with Vashe prior to applying a clean dressing using gauze sponges, not tissue or cotton balls. Peri-Wound Care Sween Lotion (Moisturizing lotion) Discharge Instruction: Apply moisturizing lotion as directed Topical Primary Dressing Promogran Prisma Matrix, 4.34 (sq in) (silver collagen) Discharge Instruction: Moisten collagen with saline or hydrogel Secondary Dressing ABD Pad, 8x10 Discharge Instruction: Apply over primary dressing as directed. Woven Gauze Sponge, Non-Sterile 4x4 in Discharge Instruction: Apply over primary dressing as directed. Secured With Compression Wrap Kerlix Roll 4.5x3.1 (in/yd) Discharge Instruction: Apply Kerlix and Coban  compression as directed. Coban Self-Adherent Wrap 4x5 (in/yd) Discharge Instruction: Apply over Kerlix as directed. Compression Stockings Add-Ons Wound #2 (Foot) Wound Laterality: Left, Distal Cleanser Soap and Water Discharge Instruction: May shower and wash wound with dial antibacterial soap and water prior to dressing change. Vashe 5.8 (oz) Discharge Instruction: Cleanse the wound with Vashe prior to applying a clean dressing using gauze sponges, not tissue or cotton balls. Peri-Wound Care Sween Lotion (Moisturizing lotion) Discharge Instruction: Apply moisturizing lotion as directed Topical Primary Dressing Promogran Prisma Matrix, 4.34 (sq in) (silver collagen) Discharge Instruction: Moisten collagen with saline or hydrogel Secondary Dressing ABD Pad, 8x10 Discharge Instruction: Apply over primary dressing as directed. Woven Gauze Sponge, Non-Sterile 4x4 in Discharge Instruction: Apply over primary dressing as directed. Secured With Compression Wrap Kerlix Roll 4.5x3.1 (in/yd) Discharge Instruction: Apply Kerlix and Coban compression as directed. Coban Self-Adherent Wrap 4x5 (in/yd) Discharge Instruction: Apply over Kerlix as directed. Compression Stockings Add-Ons [name], [name] ([id number]) [id number] Page 7 of 17 Wound #3 (Foot) Wound Laterality: Dorsal, Left, Proximal Cleanser Soap and Water Discharge Instruction: May shower and wash wound with dial antibacterial soap and water prior to dressing change. Vashe 5.8 (oz) Discharge Instruction: Cleanse the wound with Vashe prior to applying a clean dressing using gauze sponges, not tissue or cotton balls. Peri-Wound Care Sween Lotion (Moisturizing lotion) Discharge Instruction: Apply moisturizing lotion as directed Topical Primary Dressing Promogran Prisma Matrix, 4.34 (sq in) (silver collagen) Discharge Instruction: Moisten collagen with saline or hydrogel Secondary Dressing ABD Pad,  8x10 Discharge Instruction: Apply over primary dressing as directed. Woven Gauze Sponge, Non-Sterile 4x4 in Discharge Instruction: Apply over primary dressing as directed. Secured With Compression Wrap Kerlix Roll 4.5x3.1 (in/yd) Discharge Instruction: Apply Kerlix and Coban compression as directed. Coban Self-Adherent Wrap 4x5 (in/yd) Discharge Instruction: Apply over Kerlix as directed. Compression Stockings Add-Ons Wound #4 (Foot) Wound Laterality: Dorsal, Right Cleanser Soap and Water Discharge Instruction: May shower and wash wound with dial antibacterial soap and water prior to dressing change. Vashe 5.8 (oz) Discharge Instruction: Cleanse the wound with Vashe prior to applying a clean dressing using gauze sponges, not tissue or cotton balls. Peri-Wound Care Sween Lotion (Moisturizing lotion) Discharge Instruction: Apply moisturizing lotion as directed Topical Primary Dressing Promogran Prisma Matrix, 4.34 (sq in) (silver collagen) Discharge Instruction: Moisten collagen with saline or hydrogel Secondary Dressing ABD Pad, 8x10 Discharge Instruction: Apply over primary dressing as directed. Woven Gauze Sponge, Non-Sterile 4x4 in Discharge Instruction: Apply over primary dressing as directed. Secured With Compression Wrap Kerlix Roll 4.5x3.1 (in/yd) Discharge Instruction: Apply Kerlix and Coban compression as directed. Coban Self-Adherent Wrap 4x5 (in/yd) Discharge Instruction: Apply over Kerlix as directed. Compression Stockings Add-Ons [name], [name] ([id number]) [id number] Page 8 of 17 Electronic Signature(s) Signed: 05/24/2023 4:31:26 PM By:  Baltazar Najjar MD Entered By: Baltazar Najjar on 05/24/2023 09:23:08 -------------------------------------------------------------------------------- Multi-Disciplinary Care Plan Details Patient Name: Date of Service: [name] MMA [name]. 05/24/2023 8:15 A M Medical Record Number:  [id number] Patient Account Number: [id number] Date of Birth/Sex: Treating RN: 01/12/1986 (37 y.o. [name], [name] Primary Care Jax Abdelrahman: [name] Other Clinician: Referring Akeira Lahm: Treating Konstantin Lehnen/Extender: [name] in Treatment: 8 Active Inactive Pain, Acute or Chronic Nursing Diagnoses: Pain, acute or chronic: actual or potential Potential alteration in comfort, pain Goals: Patient will verbalize adequate pain control and receive pain control interventions during procedures as needed Date Initiated: 03/23/2023 Target Resolution Date: 06/04/2023 Goal Status: Active Patient/caregiver will verbalize comfort level met Date Initiated: 03/23/2023 Date Inactivated: 04/13/2023 Target Resolution Date: 04/13/2023 Goal Status: Met Interventions: Encourage patient to take pain medications as prescribed Provide education on pain management Provision of support: recognize patient pain, provide comfort and support as needed Treatment Activities: Administer pain control measures as ordered : 03/23/2023 Notes: Wound/Skin Impairment Nursing Diagnoses: Knowledge deficit related to ulceration/compromised skin integrity Goals: Patient/caregiver will verbalize understanding of skin care regimen Date Initiated: 03/23/2023 Target Resolution Date: 06/04/2023 Goal Status: Active Interventions: Assess patient/caregiver ability to obtain necessary supplies Assess patient/caregiver ability to perform ulcer/skin care regimen upon admission and as needed Provide education on ulcer and skin care Treatment Activities: Skin care regimen initiated : 03/23/2023 Topical wound management initiated : 03/23/2023 Notes: Electronic Signature(s) Signed: 05/24/2023 5:09:00 PM By: [name] Stall RN, BSN Entered By: [name] on 05/24/2023 09:11:34 [name] ([id number]) [id number] Page 9 of  17 -------------------------------------------------------------------------------- Pain Assessment Details Patient Name: Date of Service: [name] [name]. 05/24/2023 8:15 A M Medical Record Number: [id number] Patient Account Number: [id number] Date of Birth/Sex: Treating RN: 1985-08-05 (37 y.o. [name] Primary Care Jeanett Antonopoulos: [name] Other Clinician: Referring Audry Pecina: Treating Savalas Monje/Extender: [name] in Treatment: 8 Active Problems Location of Pain Severity and Description of Pain Patient Has Paino Yes Site Locations Pain Location: Generalized Pain With Dressing Change: No Duration of the Pain. Constant / Intermittento Constant Rate the pain. Current Pain Level: 4 Worst Pain Level: 10 Least Pain Level: 3 Tolerable Pain Level: 4 Pain Management and Medication Current Pain Management: Medication: Yes Cold Application: No Rest: Yes Massage: No Activity: No T.E.N.S.: No Heat Application: No Leg drop or elevation: No Is the Current Pain Management Adequate: Adequate How does your wound impact your activities of daily livingo Sleep: No Bathing: No Appetite: No Relationship With Others: No Bladder Continence: No Emotions: No Bowel Continence: No Work: No Toileting: No Drive: No Dressing: No Hobbies: No Electronic Signature(s) Signed: 05/25/2023 2:07:28 PM By: Karie Schwalbe RN Entered By: Karie Schwalbe on 05/24/2023 08:30:08 -------------------------------------------------------------------------------- Patient/Caregiver Education Details Patient Name: Date of Service: [name] MMA [name]. 11/18/2024andnbsp8:15 A M Medical Record Number: [id number] Patient Account Number: [id number] Date of Birth/Gender: Treating RN: [birth year] (37 y.o. [name] Primary Care Physician: [name] Other Clinician: Referring Physician: Treating Physician/Extender: [name] in  Treatment: [address], [name] ([id number]) [id number] Page 10 of 17 Education Assessment Education Provided To: Patient Education Topics Provided Wound/Skin Impairment: Handouts: Caring for Your Ulcer Methods: Explain/Verbal Responses: Reinforcements needed Electronic Signature(s) Signed: 05/24/2023 5:09:00 PM By: [name] Stall RN, BSN Entered By: [name] on 05/24/2023 09:11:48 -------------------------------------------------------------------------------- Wound Assessment Details Patient Name: Date of Service: [name] MMA [name]. 05/24/2023 8:15 A M Medical Record Number: [id number] Patient Account  Number: [id number] Date of Birth/Sex: Treating RN: 1985-07-11 (37 y.o. [name] Primary Care Brevyn Ring: [name] Other Clinician: Referring Arriyah Madej: Treating Andros Channing/Extender: [name] in Treatment: 8 Wound Status Wound Number: 1 Primary Etiology: Lymphedema Wound Location: Left, Medial Foot Wound Status: Open Wounding Event: Gradually Appeared Comorbid History: Anemia, Sleep Apnea, Vasculitis, Neuropathy Date Acquired: 03/09/2023 Weeks Of Treatment: 8 Clustered Wound: Yes Photos Wound Measurements Length: (cm) Width: (cm) Depth: (cm) Clustered Quantity: Area: (cm) Volume: (cm) 0.3 % Reduction in Area: 99.9% 0.3 % Reduction in Volume: 99.9% 0.1 Epithelialization: Large (67-100%) 1 Tunneling: No 0.071 Undermining: No 0.007 Wound Description Classification: Partial Thickness Exudate Amount: Small Exudate Type: Serosanguineous Exudate Color: red, brown Foul Odor After Cleansing: No Slough/Fibrino Yes Wound Bed Granulation Amount: Small (1-33%) Exposed [name], SWEM [name] ([id number]) [id number] Page 11 of 17 Necrotic Amount: Large (67-100%) Fascia Exposed: No Necrotic Quality: Eschar, Adherent Slough Fat Layer (Subcutaneous Tissue) Exposed: Yes Tendon  Exposed: No Muscle Exposed: No Joint Exposed: No Bone Exposed: No Periwound Skin Texture Texture Color No Abnormalities Noted: No No Abnormalities Noted: No Callus: No Atrophie Blanche: No Crepitus: No Cyanosis: No Excoriation: No Ecchymosis: No Induration: No Erythema: No Rash: No Hemosiderin Staining: No Scarring: No Mottled: No Pallor: No Moisture Rubor: No No Abnormalities Noted: No Dry / Scaly: No Temperature / Pain Maceration: No Temperature: No Abnormality Treatment Notes Wound #1 (Foot) Wound Laterality: Left, Medial Cleanser Soap and Water Discharge Instruction: May shower and wash wound with dial antibacterial soap and water prior to dressing change. Vashe 5.8 (oz) Discharge Instruction: Cleanse the wound with Vashe prior to applying a clean dressing using gauze sponges, not tissue or cotton balls. Peri-Wound Care Sween Lotion (Moisturizing lotion) Discharge Instruction: Apply moisturizing lotion as directed Topical Primary Dressing Promogran Prisma Matrix, 4.34 (sq in) (silver collagen) Discharge Instruction: Moisten collagen with saline or hydrogel Secondary Dressing ABD Pad, 8x10 Discharge Instruction: Apply over primary dressing as directed. Woven Gauze Sponge, Non-Sterile 4x4 in Discharge Instruction: Apply over primary dressing as directed. Secured With Compression Wrap Kerlix Roll 4.5x3.1 (in/yd) Discharge Instruction: Apply Kerlix and Coban compression as directed. Coban Self-Adherent Wrap 4x5 (in/yd) Discharge Instruction: Apply over Kerlix as directed. Compression Stockings Add-Ons Electronic Signature(s) Signed: 05/25/2023 2:07:28 PM By: Karie Schwalbe RN Entered By: Karie Schwalbe on 05/24/2023 08:54:48 -------------------------------------------------------------------------------- Wound Assessment Details Patient Name: Date of Service: [name] MMA [name]. 05/24/2023 8:15 A [place] Pugh, Lenord Carbo [id number]  [id number] Page 12 of 17 Medical Record Number: [id number] Patient Account Number: [id number] Date of Birth/Sex: Treating RN: 02-Aug-1985 (37 y.o. [name] Primary Care Azara Gemme: [name] Other Clinician: Referring Coralie Stanke: Treating Demonica Farrey/Extender: [name] in Treatment: 8 Wound Status Wound Number: 2 Primary Etiology: Lymphedema Wound Location: Left, Distal Foot Wound Status: Open Wounding Event: Gradually Appeared Comorbid History: Anemia, Sleep Apnea, Vasculitis, Neuropathy Date Acquired: 03/09/2023 Weeks Of Treatment: 8 Clustered Wound: Yes Photos Wound Measurements Length: (cm) Width: (cm) Depth: (cm) Clustered Quantity: Area: (cm) Volume: (cm) 1.2 % Reduction in Area: 92% 2 % Reduction in Volume: 92% 0.1 Epithelialization: Medium (34-66%) 1 Tunneling: No 1.885 Undermining: No 0.188 Wound Description Classification: Full Thickness Without Exposed Sup Exudate Amount: Medium Exudate Type: Serosanguineous Exudate Color: red, brown port Structures Foul Odor After Cleansing: No Slough/Fibrino Yes Wound Bed Granulation Amount: Large (67-100%) Exposed Structure Granulation Quality: Red, Pink Fascia Exposed: No Necrotic Amount: None Present (0%) Fat Layer (Subcutaneous Tissue) Exposed: Yes Tendon Exposed: No Muscle Exposed: No  Joint Exposed: No Bone Exposed: No Periwound Skin Texture Texture Color No Abnormalities Noted: No No Abnormalities Noted: No Callus: No Atrophie Blanche: No Crepitus: No Cyanosis: No Excoriation: No Ecchymosis: No Induration: No Erythema: No Rash: No Hemosiderin Staining: No Scarring: No Mottled: No Pallor: No Moisture Rubor: No No Abnormalities Noted: No Dry / Scaly: No Temperature / Pain Maceration: No Temperature: No Abnormality Treatment Notes Wound #2 (Foot) Wound Laterality: Left, Distal Cleanser Soap and Water Discharge Instruction: May shower and  wash wound with dial antibacterial soap and water prior to dressing change. Vashe 5.8 (oz) MELICENT, EISENHUTH [name] ([id number]) [id number] Page 13 of 17 Discharge Instruction: Cleanse the wound with Vashe prior to applying a clean dressing using gauze sponges, not tissue or cotton balls. Peri-Wound Care Sween Lotion (Moisturizing lotion) Discharge Instruction: Apply moisturizing lotion as directed Topical Primary Dressing Promogran Prisma Matrix, 4.34 (sq in) (silver collagen) Discharge Instruction: Moisten collagen with saline or hydrogel Secondary Dressing ABD Pad, 8x10 Discharge Instruction: Apply over primary dressing as directed. Woven Gauze Sponge, Non-Sterile 4x4 in Discharge Instruction: Apply over primary dressing as directed. Secured With Compression Wrap Kerlix Roll 4.5x3.1 (in/yd) Discharge Instruction: Apply Kerlix and Coban compression as directed. Coban Self-Adherent Wrap 4x5 (in/yd) Discharge Instruction: Apply over Kerlix as directed. Compression Stockings Add-Ons Electronic Signature(s) Signed: 05/25/2023 2:07:28 PM By: Karie Schwalbe RN Entered By: Karie Schwalbe on 05/24/2023 08:55:24 -------------------------------------------------------------------------------- Wound Assessment Details Patient Name: Date of Service: [name] MMA [name]. 05/24/2023 8:15 A M Medical Record Number: [id number] Patient Account Number: [id number] Date of Birth/Sex: Treating RN: 16-Jul-1985 (37 y.o. [name] Primary Care Takeira Yanes: [name] Other Clinician: Referring Tiannah Greenly: Treating Daleen Steinhaus/Extender: [name] in Treatment: 8 Wound Status Wound Number: 3 Primary Etiology: Lymphedema Wound Location: Left, Proximal, Dorsal Foot Wound Status: Open Wounding Event: Gradually Appeared Comorbid History: Anemia, Sleep Apnea, Vasculitis, Neuropathy Date Acquired: 03/09/2023 Weeks Of Treatment: 8 Clustered Wound:  Yes Photos Wound Measurements AMBERA, CONK [name] ([id number]) Length: (cm) 0.4 Width: (cm) 0.4 Depth: (cm) 0.1 Clustered Quantity: 3 Area: (cm) 0.126 Volume: (cm) 0.013 [id number] Page 14 of 17 % Reduction in Area: 99.2% % Reduction in Volume: 99.6% Epithelialization: None Tunneling: No Undermining: No Wound Description Classification: Partial Thickness Exudate Amount: Small Exudate Type: Serosanguineous Exudate Color: red, brown Foul Odor After Cleansing: No Slough/Fibrino Yes Wound Bed Granulation Amount: Large (67-100%) Exposed Structure Granulation Quality: Pink Fascia Exposed: No Necrotic Amount: Small (1-33%) Fat Layer (Subcutaneous Tissue) Exposed: Yes Necrotic Quality: Eschar, Adherent Slough Tendon Exposed: No Muscle Exposed: No Joint Exposed: No Bone Exposed: No Periwound Skin Texture Texture Color No Abnormalities Noted: No No Abnormalities Noted: No Callus: No Atrophie Blanche: No Crepitus: No Cyanosis: No Excoriation: No Ecchymosis: No Induration: No Erythema: No Rash: No Hemosiderin Staining: No Scarring: No Mottled: No Pallor: No Moisture Rubor: No No Abnormalities Noted: No Dry / Scaly: No Maceration: No Treatment Notes Wound #3 (Foot) Wound Laterality: Dorsal, Left, Proximal Cleanser Soap and Water Discharge Instruction: May shower and wash wound with dial antibacterial soap and water prior to dressing change. Vashe 5.8 (oz) Discharge Instruction: Cleanse the wound with Vashe prior to applying a clean dressing using gauze sponges, not tissue or cotton balls. Peri-Wound Care Sween Lotion (Moisturizing lotion) Discharge Instruction: Apply moisturizing lotion as directed Topical Primary Dressing Promogran Prisma Matrix, 4.34 (sq in) (silver collagen) Discharge Instruction: Moisten collagen with saline or hydrogel Secondary Dressing ABD Pad, 8x10 Discharge Instruction: Apply over primary dressing as  directed. Woven Gauze  Sponge, Non-Sterile 4x4 in Discharge Instruction: Apply over primary dressing as directed. Secured With Compression Wrap Kerlix Roll 4.5x3.1 (in/yd) Discharge Instruction: Apply Kerlix and Coban compression as directed. Coban Self-Adherent Wrap 4x5 (in/yd) Discharge Instruction: Apply over Kerlix as directed. Compression Stockings Add-Ons [name], [name] ([id number]) [id number] Page 15 of 17 Electronic Signature(s) Signed: 05/25/2023 2:07:28 PM By: Karie Schwalbe RN Entered By: Karie Schwalbe on 05/24/2023 08:56:06 -------------------------------------------------------------------------------- Wound Assessment Details Patient Name: Date of Service: [name] MMA [name]. 05/24/2023 8:15 A M Medical Record Number: [id number] Patient Account Number: [id number] Date of Birth/Sex: Treating RN: 11-05-1985 (37 y.o. [name] Primary Care Celita Aron: [name] Other Clinician: Referring Robey Massmann: Treating Gaylan Fauver/Extender: [name] in Treatment: 8 Wound Status Wound Number: 4 Primary Etiology: Lymphedema Wound Location: Right, Dorsal Foot Wound Status: Open Wounding Event: Gradually Appeared Comorbid History: Anemia, Sleep Apnea, Vasculitis, Neuropathy Date Acquired: 03/09/2023 Weeks Of Treatment: 8 Clustered Wound: Yes Photos Wound Measurements Length: (cm) Width: (cm) Depth: (cm) Clustered Quantity: Area: (cm) Volume: (cm) 4.7 % Reduction in Area: 53% 4.8 % Reduction in Volume: 6% 0.2 Epithelialization: Small (1-33%) 4 Tunneling: No 17.719 Undermining: No 3.544 Wound Description Classification: Full Thickness Without Exposed Supp Exudate Amount: Medium Exudate Type: Serosanguineous Exudate Color: red, brown ort Structures Foul Odor After Cleansing: No Slough/Fibrino Yes Wound Bed Granulation Amount: Medium (34-66%) Exposed Structure Granulation Quality: Red, Pink Fascia  Exposed: No Necrotic Amount: Medium (34-66%) Fat Layer (Subcutaneous Tissue) Exposed: Yes Necrotic Quality: Adherent Slough Tendon Exposed: No Muscle Exposed: No Joint Exposed: No Bone Exposed: No Periwound Skin Texture Texture Color No Abnormalities Noted: No No Abnormalities Noted: No Callus: No Atrophie Blanche: No Crepitus: No Cyanosis: No Excoriation: No Ecchymosis: No Induration: No Erythema: No Rash: No Hemosiderin Staining: No Carawan, Mallorie [name] ([id number]) [id number] Page 16 of 17 Scarring: No Mottled: No Pallor: No Moisture Rubor: No No Abnormalities Noted: No Dry / Scaly: No Temperature / Pain Maceration: No Temperature: No Abnormality Treatment Notes Wound #4 (Foot) Wound Laterality: Dorsal, Right Cleanser Soap and Water Discharge Instruction: May shower and wash wound with dial antibacterial soap and water prior to dressing change. Vashe 5.8 (oz) Discharge Instruction: Cleanse the wound with Vashe prior to applying a clean dressing using gauze sponges, not tissue or cotton balls. Peri-Wound Care Sween Lotion (Moisturizing lotion) Discharge Instruction: Apply moisturizing lotion as directed Topical Primary Dressing Promogran Prisma Matrix, 4.34 (sq in) (silver collagen) Discharge Instruction: Moisten collagen with saline or hydrogel Secondary Dressing ABD Pad, 8x10 Discharge Instruction: Apply over primary dressing as directed. Woven Gauze Sponge, Non-Sterile 4x4 in Discharge Instruction: Apply over primary dressing as directed. Secured With Compression Wrap Kerlix Roll 4.5x3.1 (in/yd) Discharge Instruction: Apply Kerlix and Coban compression as directed. Coban Self-Adherent Wrap 4x5 (in/yd) Discharge Instruction: Apply over Kerlix as directed. Compression Stockings Add-Ons Electronic Signature(s) Signed: 05/25/2023 2:07:28 PM By: Karie Schwalbe RN Entered By: Karie Schwalbe on 05/24/2023  08:56:39 -------------------------------------------------------------------------------- Vitals Details Patient Name: Date of Service: [name] MMA [name]. 05/24/2023 8:15 A M Medical Record Number: [id number] Patient Account Number: [id number] Date of Birth/Sex: Treating RN: 1985-10-21 (37 y.o. [name] Primary Care Fusaye Wachtel: [name] Other Clinician: Referring Amir Fick: Treating Kittie Krizan/Extender: [name] in Treatment: 8 Vital Signs Time Taken: 09:00 Temperature (F): 98.8 Height (in): 69 Pulse (bpm): 77 Weight (lbs): 272 Respiratory Rate (breaths/min): 18 Body Mass Index (BMI): 40.2 Blood Pressure (mmHg): 168/113 Reference Range: 80 - 120 mg / dl Whitworth, Loralye [name] (  [id number]) [id number] Page 17 of 17 Notes MD aware of B/P and CNM Electronic Signature(s) Signed: 05/25/2023 2:07:28 PM By: Karie Schwalbe RN Entered By: Karie Schwalbe on 05/24/2023 09:32:54

## 2023-05-25 NOTE — Progress Notes (Signed)
[name], [name] ([id number]) [id number] Page 1 of 8 Visit Report for 05/24/2023 HPI Details Patient Name: Date of Service: GA MMA [name] [name]. 05/24/2023 8:15 A M Medical Record Number: [id number] Patient Account Number: [id number] Date of Birth/Sex: Treating RN: Jul 23, 1985 (37 y.o. F) Primary Care Provider: Sanda Linger Other Clinician: Referring Provider: Treating Provider/Extender: Madelyn Flavors in Treatment: 8 History of Present Illness HPI Description: ADMISSION 03/23/2023 This is a 37 year old woman who is Story is somewhat difficult to follow however she apparently has developed bilateral lower extremity swelling involving her feet and ankles roughly 3 weeks ago. This but this became associated with small painful areas on predominantly her left greater than right foot with exfoliation of the skin. She was admitted to hospital from 03/16/2023 through 03/19/2023. X-ray of the feet was negative however she was noted to have a sedimentation rate greater than 90. Although she was empirically given antibiotics at the start of the admission. She was seen by infectious disease who did not feel this was infectious. It was felt that she required a skin biopsy for possible vasculitis. Dermatology and rheumatology consults were suggested. She was discharged from the hospital on a gradually tapering dose of prednisone. She had cryoglobulins, complement levels measured. She did not have an active urinalysis. She has been soaking her feet in ice water and cold towels to relieve the pain. She saw her dermatologist who is in [place] yesterday for consideration of a skin biopsy. Per the patient's description the dermatologist did not wish to do a skin biopsy stating that he did not want to create another wound area although I do not have his or her notes at this point and I do not know what diagnosis she was felt to have. She arrives in clinic today with  discomfort in her feet. Exfoliating skin on the dorsal surfaces of both feet punched-out areas with necrosis especially on the left dorsal foot left lateral ankle. There is also areas on the right heel. Past medical history includes acute hepatitis question involving alcohol or hepatitis A, bilateral leg edema, obesity, hypertension, history of psoriasis on Taltz, chronic pain, apparently some form of idiopathic peripheral neuropathy, vitamin B12 deficiency ABIs in our clinic were 1.03 on the right 1.25 on the left Addendum 03/25/2023. I received the records from Dr. Cheree Ditto at [facility] dermatology in [place]. Looking at her record she felt that the issue was secondary to lymphedema and stasis dermatitis on both legs. I am not sure whether she looked at the areas on her feet which was the real area of concern. She specifically stated that she did not think that this was related to her psoriasis. Compression stockings were suggested. No biopsy was done 9/24; as noted above I did receive records from Dr. Cheree Ditto at [facility] dermatology and [place]. She felt the skin changes on her feet were related to lymphedema and stasis dermatitis. The patient arrived back in clinic the skin on her dorsal feet looks a lot better and she is in a lot less pain. HOWEVER she does have multiple small punched- out areas covered in black eschar. I did not biopsy this when I saw her the first time as she was going to dermatology the next day in retrospect that may not have been the right decision. 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 9/30; patient arrives in clinic with no complaints  of pain at all. This is quite an improvement from the first time I saw her. T the punched out holes on her dorsal o feet and right ankle we have been using Santyl. The eschar is loosening to the point where it might be a reasonably easy debridement. We  have also been using TCA and the skin on her feet looks a lot better. She is still on prednisone 40 mg. This was started tapering 60 mg 10 mg weekly in the hospital for vasculitis. Unfortunately no tissue biopsy was done. 10/8; again no pain. The skin in her bilateral dorsal feet looks better than when she first came into the clinic. We have been wrapping her there is no real edema. She comes into the clinic with the wraps falling all the way down however I am not sure what the issue here is. As it turns out nobody is following the prednisone dosage. She went to her primary doctor he did not really address this she is currently on 30 mg of prednisone. As we do not have a diagnosis here I am going to taper the prednisone a little more aggressively and follow the condition of her legs. If she develops other lesions then we will have to think about this but right now we are keeping her on high doses of prednisone without a diagnosis I simply do not agree with this. 10/15; I have tapered her down to 10 mg of prednisone I put in for 5 mg tablets enough for 5 days. Counseled her about steroid withdrawal also the implications for rapid deterioration in her underlying status. As I mentioned previously no concrete diagnosis is available. She did not receive a biopsy. 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 She arrived in clinic today with multiple areas on her bilateral dorsal feet smaller or healed but still some areas that have a necrotic surface although the covering eschar is certainly less adherent that when I first saw her 10/22; starting the 5 mg of prednisone today. Still using Santyl, Hydrofera Blue on the open areas on her feet and ankles. We are still putting her in compression. Liberal use of triamcinolone under the compression The wounds are a lot better today many of them are healed. No new wound areas 10/28; will be finished her prednisone by the middle part of this  week. Still using Santyl, Hydrofera Blue and TCA under compression to her bilateral feet. There are no open wounds. Almost all of her open areas have a clean surface except for 1 area on the right dorsal foot. She did not wish to be debrided this week rather she stated she would put that off till next week 11/4; everything continues to be a lot better. Several of the punched-out wounds have healed including the area on the right lateral calcaneus. We continue with Hydrofera Blue Santyl TCA widely under compression. STESHA, OXLEY [name] ([id number]) [id number] Page 2 of 8 She is not having any ill effects of stopping her prednisone she feels well. 11/11; the patient still has small open areas on the right dorsal foot, left medial foot left dorsal foot. No new wounds. Some of these wounds from last time if healed. No ill effects of stopping her prednisone. Using [facility] and Santyl 11/18; 2 wounds on the right dorsal foot and 1 on the left dorsal foot closely proximal to the third and fourth toes. All of these wounds much healthier in terms of granulation no debridement is required  everything else is either closed or very close to it. There has been no new wounds no systemic symptoms Electronic Signature(s) Signed: 05/24/2023 4:31:26 PM By: Baltazar Najjar MD Entered By: Baltazar Najjar on 05/24/2023 09:24:32 -------------------------------------------------------------------------------- Physical Exam Details Patient Name: Date of Service: [name], [name] [name]. 05/24/2023 8:15 A M Medical Record Number: [id number] Patient Account Number: [id number] Date of Birth/Sex: Treating RN: 1986/02/16 (37 y.o. F) Primary Care Provider: Sanda Linger Other Clinician: Referring Provider: Treating Provider/Extender: Madelyn Flavors in Treatment: 8 Constitutional Patient is hypertensive.. Pulse regular and within target range for patient.Marland Kitchen Respirations  regular, non-labored and within target range.. Temperature is normal and within the target range for the patient.Marland Kitchen Appears in no distress. Notes Wound exam; 2 smaller wounds with clean granulated base on the right dorsal foot and the 1 on the left hand a similar condition just proximal to the third and fourth toe. No mechanical debridement is required. Most of the other wounds have healed some have slight eschar on the surface that I did not disturb. No new wounds Electronic Signature(s) Signed: 05/24/2023 4:31:26 PM By: Baltazar Najjar MD Entered By: Baltazar Najjar on 05/24/2023 09:27:43 -------------------------------------------------------------------------------- Physician Orders Details Patient Name: Date of Service: [name], [name] [name]. 05/24/2023 8:15 A M Medical Record Number: [id number] Patient Account Number: [id number] Date of Birth/Sex: Treating RN: 01-01-1986 (37 y.o. 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 - Dr. Leanord Hawking -front office to schedule Return A Nurse Visit: - 05/31/2023 1015 (already scheduled) LAVELLE, SHELBY [name] ([id number]) [id number] Page 3 of 8 Other: - Kerlix and coban stops at mid lower leg. Anesthetic (In clinic) Topical Lidocaine 4% applied to wound bed Bathing/ Shower/ Hygiene May shower with protection but do not get wound dressing(s) wet.  Protect dressing(s) with water repellant cover (for example, large plastic bag) or a cast cover and may then take shower. Edema Control - Orders / Instructions Elevate legs to the level of the heart or above for 30 minutes daily and/or when sitting for 3-4 times a day throughout the day. Avoid standing for long periods of time. Exercise regularly Wound Treatment Wound #1 - Foot Wound Laterality: Left, Medial Cleanser: Soap and Water 1 x Per Week/30 Days Discharge Instructions: May shower and wash wound with dial antibacterial soap and water prior to dressing change. Cleanser: Vashe 5.8 (oz) 1 x Per Week/30 Days Discharge Instructions: Cleanse the wound with Vashe prior to applying a clean dressing using gauze sponges, not tissue or cotton balls. Peri-Wound Care: Sween Lotion (Moisturizing lotion) 1 x Per Week/30 Days Discharge Instructions: Apply moisturizing lotion as directed Prim Dressing: Promogran Prisma Matrix, 4.34 (sq in) (silver collagen) 1 x Per Week/30 Days ary Discharge Instructions: Moisten collagen with saline or hydrogel Secondary Dressing: ABD Pad, 8x10 1 x Per Week/30 Days Discharge Instructions: Apply over primary dressing as directed. Secondary Dressing: Woven Gauze Sponge, Non-Sterile 4x4 in 1 x Per Week/30 Days Discharge Instructions: Apply over primary dressing as directed. Compression Wrap: Kerlix Roll 4.5x3.1 (in/yd)  1 x Per Week/30 Days Discharge Instructions: Apply Kerlix and Coban compression as directed. Compression Wrap: Coban Self-Adherent Wrap 4x5 (in/yd) 1 x Per Week/30 Days Discharge Instructions: Apply over Kerlix as directed. Wound #2 - Foot Wound Laterality: Left, Distal Cleanser: Soap and Water 1 x Per Week/30 Days Discharge Instructions: May shower and wash wound with dial antibacterial soap and water prior to dressing change. Cleanser: Vashe 5.8 (oz) 1 x Per Week/30 Days Discharge Instructions: Cleanse the wound with Vashe prior to applying a clean  dressing using gauze sponges, not tissue or cotton balls. Peri-Wound Care: Sween Lotion (Moisturizing lotion) 1 x Per Week/30 Days Discharge Instructions: Apply moisturizing lotion as directed Prim Dressing: Promogran Prisma Matrix, 4.34 (sq in) (silver collagen) 1 x Per Week/30 Days ary Discharge Instructions: Moisten collagen with saline or hydrogel Secondary Dressing: ABD Pad, 8x10 1 x Per Week/30 Days Discharge Instructions: Apply over primary dressing as directed. Secondary Dressing: Woven Gauze Sponge, Non-Sterile 4x4 in 1 x Per Week/30 Days Discharge Instructions: Apply over primary dressing as directed. Compression Wrap: Kerlix Roll 4.5x3.1 (in/yd) 1 x Per Week/30 Days Discharge Instructions: Apply Kerlix and Coban compression as directed. Compression Wrap: Coban Self-Adherent Wrap 4x5 (in/yd) 1 x Per Week/30 Days Discharge Instructions: Apply over Kerlix as directed. Wound #3 - Foot Wound Laterality: Dorsal, Left, Proximal Cleanser: Soap and Water 1 x Per Week/30 Days Discharge Instructions: May shower and wash wound with dial antibacterial soap and water prior to dressing change. Cleanser: Vashe 5.8 (oz) 1 x Per Week/30 Days Discharge Instructions: Cleanse the wound with Vashe prior to applying a clean dressing using gauze sponges, not tissue or cotton balls. Peri-Wound Care: Sween Lotion (Moisturizing lotion) 1 x Per Week/30 Days Discharge Instructions: Apply moisturizing lotion as directed Prim Dressing: Promogran Prisma Matrix, 4.34 (sq in) (silver collagen) 1 x Per Week/30 Days ary Discharge Instructions: Moisten collagen with saline or hydrogel Mohiuddin, Mariadelcarmen [name] ([id number]) [id number] Page 4 of 8 Secondary Dressing: ABD Pad, 8x10 1 x Per Week/30 Days Discharge Instructions: Apply over primary dressing as directed. Secondary Dressing: Woven Gauze Sponge, Non-Sterile 4x4 in 1 x Per Week/30 Days Discharge Instructions: Apply over primary dressing  as directed. Compression Wrap: Kerlix Roll 4.5x3.1 (in/yd) 1 x Per Week/30 Days Discharge Instructions: Apply Kerlix and Coban compression as directed. Compression Wrap: Coban Self-Adherent Wrap 4x5 (in/yd) 1 x Per Week/30 Days Discharge Instructions: Apply over Kerlix as directed. Wound #4 - Foot Wound Laterality: Dorsal, Right Cleanser: Soap and Water 1 x Per Week/30 Days Discharge Instructions: May shower and wash wound with dial antibacterial soap and water prior to dressing change. Cleanser: Vashe 5.8 (oz) 1 x Per Week/30 Days Discharge Instructions: Cleanse the wound with Vashe prior to applying a clean dressing using gauze sponges, not tissue or cotton balls. Peri-Wound Care: Sween Lotion (Moisturizing lotion) 1 x Per Week/30 Days Discharge Instructions: Apply moisturizing lotion as directed Prim Dressing: Promogran Prisma Matrix, 4.34 (sq in) (silver collagen) 1 x Per Week/30 Days ary Discharge Instructions: Moisten collagen with saline or hydrogel Secondary Dressing: ABD Pad, 8x10 1 x Per Week/30 Days Discharge Instructions: Apply over primary dressing as directed. Secondary Dressing: Woven Gauze Sponge, Non-Sterile 4x4 in 1 x Per Week/30 Days Discharge Instructions: Apply over primary dressing as directed. Compression Wrap: Kerlix Roll 4.5x3.1 (in/yd) 1 x Per Week/30 Days Discharge Instructions: Apply Kerlix and Coban compression as directed. Compression Wrap: Coban Self-Adherent Wrap 4x5 (in/yd) 1 x Per Week/30 Days Discharge Instructions: Apply over Kerlix as directed. Electronic Signature(s) Signed:  05/24/2023 4:31:26 PM By: Baltazar Najjar MD Signed: 05/24/2023 5:09:00 PM By: Shawn Stall RN, BSN Entered By: Shawn Stall on 05/24/2023 09:13:54 -------------------------------------------------------------------------------- Problem List Details Patient Name: Date of Service: [name], [name] [name]. 05/24/2023 8:15 A M Medical Record Number: [id number] Patient Account  Number: [id number] Date of Birth/Sex: Treating RN: [birth year] (37 y.o. 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 05/24/2023 8:15 A M Medical Record Number: [id number] Patient Account Number: [id number] Date of Birth/Sex: Treating RN: Feb 09, 1986 (37 y.o. F) Primary Care Provider: Sanda Linger Other Clinician: Referring Provider: Treating Provider/Extender: Madelyn Flavors in Treatment: 8 Subjective History of Present Illness (HPI) ADMISSION 03/23/2023 This is a 37 year old woman who is Story is somewhat difficult to follow however she apparently has developed bilateral lower extremity swelling involving her feet and ankles roughly 3 weeks ago. This but this became associated with small painful areas on predominantly her left greater than right foot with exfoliation of the skin. She was admitted to hospital from 03/16/2023 through 03/19/2023. X-ray of the feet was negative however she was noted to have a sedimentation rate greater than 90. Although she was empirically given antibiotics at the  start of the admission. She was seen by infectious disease who did not feel this was infectious. It was felt that she required a skin biopsy for possible vasculitis. Dermatology and rheumatology consults were suggested. She was discharged from the hospital on a gradually tapering dose of prednisone. She had cryoglobulins, complement levels measured. She did not have an active urinalysis. She has been soaking her feet in ice water and cold towels to relieve the pain. She saw her dermatologist who is in [place] yesterday for consideration of a skin biopsy. Per the patient's description the dermatologist did not wish to do a skin biopsy stating that he did not want to create another wound area although I do not have his or her notes at this point and I do not know what diagnosis she was felt to have. She arrives in clinic today with discomfort in her feet. Exfoliating skin on the dorsal surfaces of both feet punched-out areas with necrosis especially on the left dorsal foot left lateral ankle. There is also areas on the right heel. Past medical history includes acute hepatitis question involving alcohol or hepatitis A, bilateral leg edema, obesity, hypertension, history of psoriasis on Taltz, chronic pain, apparently some form of idiopathic peripheral neuropathy, vitamin B12 deficiency ABIs in our clinic were 1.03 on the right 1.25 on the left Addendum 03/25/2023. I received the records from Dr. Cheree Ditto at [facility] dermatology in [place]. Looking at her record she felt that the issue was secondary to lymphedema and stasis dermatitis on both legs. I am not sure whether she looked at the areas on her feet which was the real area of concern. She specifically  stated that she did not think that this was related to her psoriasis. Compression stockings were suggested. No biopsy was done 9/24; as noted above I did receive records from Dr. Cheree Ditto at [facility] dermatology and [place]. She felt the skin changes on her feet were  related to lymphedema and stasis dermatitis. The patient arrived back in clinic the skin on her dorsal feet looks a lot better and she is in a lot less pain. HOWEVER she does have multiple small punched- out areas covered in black eschar. I did not biopsy this when I saw her the first time as she was going to dermatology the next day in retrospect that may not have been the right decision. 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 9/30; patient arrives in clinic with no complaints of pain at all. This is quite an improvement from the first time I saw her. T the punched out holes on her dorsal o feet and right ankle we have been using Santyl. The eschar is loosening to the point where it might be a reasonably easy debridement. We have also been using TCA and the skin on her feet looks a lot better. She is still on prednisone 40 mg. This was started tapering 60 mg 10 mg weekly in the hospital for vasculitis. Unfortunately no tissue biopsy was done. 10/8; again no pain. The skin in her bilateral dorsal feet looks better than when she first came into the clinic. We have been wrapping her there is no real edema. She comes into the clinic with the wraps falling all the way down however I am not sure what the issue here is. As it turns out nobody is following the prednisone dosage. She went to her primary doctor he did not really address this she is currently on 30 mg of prednisone. As we do not have a diagnosis here I am going to taper the prednisone a little more aggressively and follow the condition of her legs. If she develops other lesions then we will have to think about this but right now we are keeping her on high doses of prednisone without a diagnosis I simply do not agree with this. 10/15; I have tapered her down to 10 mg of prednisone I put in for 5 mg tablets enough for 5 days. Counseled her about  steroid withdrawal also the implications for rapid deterioration in her underlying status. SHIVYA, SCHRAM [name] ([id number]) [id number] Page 6 of 8 As I mentioned previously no concrete diagnosis is available. She did not receive a biopsy. 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 She arrived in clinic today with multiple areas on her bilateral dorsal feet smaller or healed but still some areas that have a necrotic surface although the covering eschar is certainly less adherent that when I first saw her 10/22; starting the 5 mg of prednisone today. Still using Santyl, Hydrofera Blue on the open areas on her feet and ankles. We are still putting her in compression. Liberal use of triamcinolone under the compression The wounds are a lot better today many of them are healed. No new wound areas 10/28; will be finished her prednisone by the middle part of this week. Still using Santyl, Hydrofera Blue and TCA under compression to her bilateral feet. There are no open wounds. Almost all of her open areas have a clean surface except for  1 area on the right dorsal foot. She did not wish to be debrided this week rather she stated she would put that off till next week 11/4; everything continues to be a lot better. Several of the punched-out wounds have healed including the area on the right lateral calcaneus. We continue with Hydrofera Blue Santyl TCA widely under compression. She is not having any ill effects of stopping her prednisone she feels well. 11/11; the patient still has small open areas on the right dorsal foot, left medial foot left dorsal foot. No new wounds. Some of these wounds from last time if healed. No ill effects of stopping her prednisone. Using [facility] and Santyl 11/18; 2 wounds on the right dorsal foot and 1 on the left dorsal foot closely proximal to the third and fourth toes. All of these wounds much healthier in terms of  granulation no debridement is required everything else is either closed or very close to it. There has been no new wounds no systemic symptoms Objective Constitutional Patient is hypertensive.. Pulse regular and within target range for patient.Marland Kitchen Respirations regular, non-labored and within target range.. Temperature is normal and within the target range for the patient.Marland Kitchen Appears in no distress. Vitals Time Taken: 9:00 AM, Height: 69 in, Weight: 272 lbs, BMI: 40.2, Temperature: 98.8 F, Pulse: 77 bpm, Respiratory Rate: 18 breaths/min, Blood Pressure: 168/113 mmHg. General Notes: MD aware of B/P and CNM General Notes: Wound exam; 2 smaller wounds with clean granulated base on the right dorsal foot and the 1 on the left hand a similar condition just proximal to the third and fourth toe. No mechanical debridement is required. Most of the other wounds have healed some have slight eschar on the surface that I did not disturb. No new wounds Integumentary (Hair, Skin) Wound #1 status is Open. Original cause of wound was Gradually Appeared. The date acquired was: 03/09/2023. The wound has been in treatment 8 weeks. The wound is located on the Left,Medial Foot. The wound measures 0.3cm length x 0.3cm width x 0.1cm depth; 0.071cm^2 area and 0.007cm^3 volume. There is Fat Layer (Subcutaneous Tissue) exposed. There is no tunneling or undermining noted. There is a small amount of serosanguineous drainage noted. There is small (1-33%) granulation within the wound bed. There is a large (67-100%) amount of necrotic tissue within the wound bed including Eschar and Adherent Slough. The periwound skin appearance did not exhibit: Callus, Crepitus, Excoriation, Induration, Rash, Scarring, Dry/Scaly, Maceration, Atrophie Blanche, Cyanosis, Ecchymosis, Hemosiderin Staining, Mottled, Pallor, Rubor, Erythema. Periwound temperature was noted as No Abnormality. Wound #2 status is Open. Original cause of wound was Gradually  Appeared. The date acquired was: 03/09/2023. The wound has been in treatment 8 weeks. The wound is located on the Left,Distal Foot. The wound measures 1.2cm length x 2cm width x 0.1cm depth; 1.885cm^2 area and 0.188cm^3 volume. There is Fat Layer (Subcutaneous Tissue) exposed. There is no tunneling or undermining noted. There is a medium amount of serosanguineous drainage noted. There is large (67-100%) red, pink granulation within the wound bed. There is no necrotic tissue within the wound bed. The periwound skin appearance did not exhibit: Callus, Crepitus, Excoriation, Induration, Rash, Scarring, Dry/Scaly, Maceration, Atrophie Blanche, Cyanosis, Ecchymosis, Hemosiderin Staining, Mottled, Pallor, Rubor, Erythema. Periwound temperature was noted as No Abnormality. Wound #3 status is Open. Original cause of wound was Gradually Appeared. The date acquired was: 03/09/2023. The wound has been in treatment 8 weeks. The wound is located on the Left,Proximal,Dorsal Foot. The wound measures  0.4cm length x 0.4cm width x 0.1cm depth; 0.126cm^2 area and 0.013cm^3 volume. There is Fat Layer (Subcutaneous Tissue) exposed. There is no tunneling or undermining noted. There is a small amount of serosanguineous drainage noted. There is large (67-100%) pink granulation within the wound bed. There is a small (1-33%) amount of necrotic tissue within the wound bed including Eschar and Adherent Slough. The periwound skin appearance did not exhibit: Callus, Crepitus, Excoriation, Induration, Rash, Scarring, Dry/Scaly, Maceration, Atrophie Blanche, Cyanosis, Ecchymosis, Hemosiderin Staining, Mottled, Pallor, Rubor, Erythema. Wound #4 status is Open. Original cause of wound was Gradually Appeared. The date acquired was: 03/09/2023. The wound has been in treatment 8 weeks. The wound is located on the Right,Dorsal Foot. The wound measures 4.7cm length x 4.8cm width x 0.2cm depth; 17.719cm^2 area and 3.544cm^3 volume. There is Fat  Layer (Subcutaneous Tissue) exposed. There is no tunneling or undermining noted. There is a medium amount of serosanguineous drainage noted. There is medium (34-66%) red, pink granulation within the wound bed. There is a medium (34-66%) amount of necrotic tissue within the wound bed including Adherent Slough. The periwound skin appearance did not exhibit: Callus, Crepitus, Excoriation, Induration, Rash, Scarring, Dry/Scaly, Maceration, Atrophie Blanche, Cyanosis, Ecchymosis, Hemosiderin Staining, Mottled, Pallor, Rubor, Erythema. Periwound temperature was noted as No Abnormality. 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 - Dr. Leanord Hawking -front office to schedule Nurse Visit: - 05/31/2023 1015 (already scheduled) Other: - Kerlix and coban stops at mid lower leg. Anesthetic: (In clinic) Topical Lidocaine 4% applied to wound bed Bathing/ Shower/ Hygiene: May shower with protection but do not get wound dressing(s) wet. Protect dressing(s) with water repellant cover (for example, large plastic bag) or a cast cover and may then take shower. Edema Control - Orders / Instructions: Elevate legs to the level of the heart or above for 30 minutes daily and/or when sitting for 3-4 times a day throughout the day. Avoid standing for long periods of time. Exercise regularly WOUND #1: - Foot Wound Laterality: Left, Medial Cleanser: Soap and Water 1 x Per Week/30 Days Discharge Instructions: May shower and wash wound with dial antibacterial soap and water prior to dressing change. Cleanser: Vashe 5.8 (oz) 1 x Per Week/30 Days Discharge Instructions: Cleanse the wound with  Vashe prior to applying a clean dressing using gauze sponges, not tissue or cotton balls. Peri-Wound Care: Sween Lotion (Moisturizing lotion) 1 x Per Week/30 Days Discharge Instructions: Apply moisturizing lotion as directed Prim Dressing: Promogran Prisma Matrix, 4.34 (sq in) (silver collagen) 1 x Per Week/30 Days ary Discharge Instructions: Moisten collagen with saline or hydrogel Secondary Dressing: ABD Pad, 8x10 1 x Per Week/30 Days Discharge Instructions: Apply over primary dressing as directed. Secondary Dressing: Woven Gauze Sponge, Non-Sterile 4x4 in 1 x Per Week/30 Days Discharge Instructions: Apply over primary dressing as directed. Com pression Wrap: Kerlix Roll 4.5x3.1 (in/yd) 1 x Per Week/30 Days Discharge Instructions: Apply Kerlix and Coban compression as directed. Com pression Wrap: Coban Self-Adherent Wrap 4x5 (in/yd) 1 x Per Week/30 Days Discharge Instructions: Apply over Kerlix as directed. WOUND #2: - Foot Wound Laterality: Left, Distal Cleanser: Soap and Water 1 x Per Week/30 Days Discharge Instructions: May shower and wash wound with dial antibacterial soap and water prior  to dressing change. Cleanser: Vashe 5.8 (oz) 1 x Per Week/30 Days Discharge Instructions: Cleanse the wound with Vashe prior to applying a clean dressing using gauze sponges, not tissue or cotton balls. Peri-Wound Care: Sween Lotion (Moisturizing lotion) 1 x Per Week/30 Days Discharge Instructions: Apply moisturizing lotion as directed Prim Dressing: Promogran Prisma Matrix, 4.34 (sq in) (silver collagen) 1 x Per Week/30 Days ary Discharge Instructions: Moisten collagen with saline or hydrogel Secondary Dressing: ABD Pad, 8x10 1 x Per Week/30 Days Discharge Instructions: Apply over primary dressing as directed. Secondary Dressing: Woven Gauze Sponge, Non-Sterile 4x4 in 1 x Per Week/30 Days Discharge Instructions: Apply over primary dressing as directed. Com pression Wrap: Kerlix Roll 4.5x3.1  (in/yd) 1 x Per Week/30 Days Discharge Instructions: Apply Kerlix and Coban compression as directed. Com pression Wrap: Coban Self-Adherent Wrap 4x5 (in/yd) 1 x Per Week/30 Days Discharge Instructions: Apply over Kerlix as directed. WOUND #3: - Foot Wound Laterality: Dorsal, Left, Proximal Cleanser: Soap and Water 1 x Per Week/30 Days Discharge Instructions: May shower and wash wound with dial antibacterial soap and water prior to dressing change. Cleanser: Vashe 5.8 (oz) 1 x Per Week/30 Days Discharge Instructions: Cleanse the wound with Vashe prior to applying a clean dressing using gauze sponges, not tissue or cotton balls. Peri-Wound Care: Sween Lotion (Moisturizing lotion) 1 x Per Week/30 Days Discharge Instructions: Apply moisturizing lotion as directed Prim Dressing: Promogran Prisma Matrix, 4.34 (sq in) (silver collagen) 1 x Per Week/30 Days ary Discharge Instructions: Moisten collagen with saline or hydrogel Secondary Dressing: ABD Pad, 8x10 1 x Per Week/30 Days Discharge Instructions: Apply over primary dressing as directed. Secondary Dressing: Woven Gauze Sponge, Non-Sterile 4x4 in 1 x Per Week/30 Days Discharge Instructions: Apply over primary dressing as directed. Com pression Wrap: Kerlix Roll 4.5x3.1 (in/yd) 1 x Per Week/30 Days Discharge Instructions: Apply Kerlix and Coban compression as directed. Com pression Wrap: Coban Self-Adherent Wrap 4x5 (in/yd) 1 x Per Week/30 Days Discharge Instructions: Apply over Kerlix as directed. WOUND #4: - Foot Wound Laterality: Dorsal, Right Cleanser: Soap and Water 1 x Per Week/30 Days Discharge Instructions: May shower and wash wound with dial antibacterial soap and water prior to dressing change. Cleanser: Vashe 5.8 (oz) 1 x Per Week/30 Days Discharge Instructions: Cleanse the wound with Vashe prior to applying a clean dressing using gauze sponges, not tissue or cotton balls. Peri-Wound Care: Sween Lotion (Moisturizing lotion) 1 x Per  Week/30 Days Discharge Instructions: Apply moisturizing lotion as directed Prim Dressing: Promogran Prisma Matrix, 4.34 (sq in) (silver collagen) 1 x Per Week/30 Days ary Discharge Instructions: Moisten collagen with saline or hydrogel Secondary Dressing: ABD Pad, 8x10 1 x Per Week/30 Days Discharge Instructions: Apply over primary dressing as directed. Secondary Dressing: Woven Gauze Sponge, Non-Sterile 4x4 in 1 x Per Week/30 Days Discharge Instructions: Apply over primary dressing as directed. Com pression Wrap: Kerlix Roll 4.5x3.1 (in/yd) 1 x Per Week/30 Days Discharge Instructions: Apply Kerlix and Coban compression as directed. Com pression Wrap: Coban Self-Adherent Wrap 4x5 (in/yd) 1 x Per Week/30 Days Discharge Instructions: Apply over Kerlix as directed. [name], [name] ([id number]) [id number] Page 8 of 8 1) 3 wounds remain. They are clean. I changed the primary dressing to Prisma still under kerlix Coban 2 no new wounds. No problems with withdrawal of the TCA cream last week for the systemic steroids 2 weeks ago 3. The exact diagnosis here is unclear but I think this was some form of vasculopathy/vasculitis. Any recurrence of this would need  to be biopsied Electronic Signature(s) Signed: 05/26/2023 4:36:51 PM By: Shawn Stall RN, BSN Signed: 05/26/2023 4:37:54 PM By: Baltazar Najjar MD Previous Signature: 05/24/2023 4:31:26 PM Version By: Baltazar Najjar MD Entered By: Shawn Stall on 05/26/2023 16:31:55 -------------------------------------------------------------------------------- SuperBill Details Patient Name: Date of Service: [name], [name] [name]. 05/24/2023 Medical Record Number: [id number] Patient Account Number: [id number] Date of Birth/Sex: Treating RN: 1985-07-18 (37 y.o. 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 Facility Procedures : CPT4 Code: [id number] Description: 95188 - WOUND CARE VISIT-LEV 5 EST PT Modifier: Quantity: 1 Physician Procedures : CPT4 Code Description Modifier [id number] - WC PHYS LEVEL 3 - EST PT ICD-10 Diagnosis Description L97.518 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 severity L97.528 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

## 2023-05-31 ENCOUNTER — Encounter (HOSPITAL_BASED_OUTPATIENT_CLINIC_OR_DEPARTMENT_OTHER): Payer: BC Managed Care – PPO | Admitting: General Surgery

## 2023-05-31 DIAGNOSIS — G8929 Other chronic pain: Secondary | ICD-10-CM | POA: Diagnosis not present

## 2023-05-31 DIAGNOSIS — E538 Deficiency of other specified B group vitamins: Secondary | ICD-10-CM | POA: Diagnosis not present

## 2023-05-31 DIAGNOSIS — L97528 Non-pressure chronic ulcer of other part of left foot with other specified severity: Secondary | ICD-10-CM | POA: Diagnosis not present

## 2023-05-31 DIAGNOSIS — I872 Venous insufficiency (chronic) (peripheral): Secondary | ICD-10-CM | POA: Diagnosis not present

## 2023-05-31 DIAGNOSIS — I890 Lymphedema, not elsewhere classified: Secondary | ICD-10-CM | POA: Diagnosis not present

## 2023-05-31 DIAGNOSIS — D649 Anemia, unspecified: Secondary | ICD-10-CM | POA: Diagnosis not present

## 2023-05-31 DIAGNOSIS — I10 Essential (primary) hypertension: Secondary | ICD-10-CM | POA: Diagnosis not present

## 2023-05-31 DIAGNOSIS — G4730 Sleep apnea, unspecified: Secondary | ICD-10-CM | POA: Diagnosis not present

## 2023-05-31 DIAGNOSIS — L97518 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 severity: Secondary | ICD-10-CM | POA: Diagnosis not present

## 2023-05-31 NOTE — Progress Notes (Signed)
BELMIRA, HARNE [name] ([id number]) [id number] Page 1 of 1 Visit Report for 05/31/2023 SuperBill Details Patient Name: [name], [name]. Date of Service: 05/31/2023 Medical Record Number: [id number] Patient Account Number: [id number] Date of Birth/Sex: Treating RN: [birth year] (37 y.o. 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

## 2023-05-31 NOTE — Progress Notes (Signed)
[name], [name] ([id number]) [id number] Page 1 of 9 Visit Report for 05/31/2023 Arrival Information Details Patient Name: Date of Service: [name] MMA [name] 05/31/2023 10:15 A M Medical Record Number: [id number] Patient Account Number: [id number] Date of Birth/Sex: Treating RN: October 12, 1985 (37 y.o. F) Primary Care Staphany Ditton: Sanda Linger Other Clinician: Referring Gergory Biello: Treating Cincere Zorn/Extender: Royston Sinner in Treatment: 9 Visit Information History Since Last Visit Added or deleted any medications: No Patient Arrived: Ambulatory Any new allergies or adverse reactions: No Arrival Time: 10:29 Had a fall or experienced change in No Accompanied By: mother activities of daily living that may affect Transfer Assistance: None risk of falls: Patient Identification Verified: Yes Signs or symptoms of abuse/neglect since last visito No Secondary Verification Process Completed: Yes Hospitalized since last visit: No Patient Requires Transmission-Based Precautions: No Implantable device outside of the clinic excluding No Patient Has Alerts: No cellular tissue based products placed in the center since last visit: Has Dressing in Place as Prescribed: Yes Pain Present Now: No Electronic Signature(s) Signed: 05/31/2023 4:03:39 PM By: Thayer Dallas Entered By: Thayer Dallas on 05/31/2023 07:34:49 -------------------------------------------------------------------------------- Clinic Level of Care Assessment Details Patient Name: Date of Service: [name] MMA [name], [name] [name]. 05/31/2023 10:15 A M Medical Record Number: [id number] Patient Account Number: [id number] Date of Birth/Sex: Treating RN: September 06, 1985 (37 y.o. F) Primary Care Brooke Steinhilber: Sanda Linger Other Clinician: Referring Tonette Koehne: Treating Merranda Bolls/Extender: Royston Sinner in Treatment: 9 Clinic Level of Care Assessment Items TOOL 4 Quantity Score X- 1  0 Use when only an EandM is performed on FOLLOW-UP visit ASSESSMENTS - Nursing Assessment / Reassessment X- 1 10 Reassessment of Co-morbidities (includes updates in patient status) X- 1 5 Reassessment of Adherence to Treatment Plan ASSESSMENTS - Wound and Skin A ssessment / Reassessment []  - 0 Simple Wound Assessment / Reassessment - one wound []  - 0 Complex Wound Assessment / Reassessment - multiple wounds []  - 0 Dermatologic / Skin Assessment (not related to wound area) ASSESSMENTS - Focused Assessment []  - 0 Circumferential Edema Measurements - multi extremities []  - 0 Nutritional Assessment / Counseling / Intervention [name], [name] ([id number]) [id number] Page 2 of 9 []  - 0 Lower Extremity Assessment (monofilament, tuning fork, pulses) []  - 0 Peripheral Arterial Disease Assessment (using hand held doppler) ASSESSMENTS - Ostomy and/or Continence Assessment and Care []  - 0 Incontinence Assessment and Management []  - 0 Ostomy Care Assessment and Management (repouching, etc.) PROCESS - Coordination of Care X - Simple Patient / Family Education for ongoing care 1 15 []  - 0 Complex (extensive) Patient / Family Education for ongoing care []  - 0 Staff obtains Chiropractor, Records, T Results / Process Orders est []  - 0 Staff telephones HHA, Nursing Homes / Clarify orders / etc []  - 0 Routine Transfer to another Facility (non-emergent condition) []  - 0 Routine Hospital Admission (non-emergent condition) []  - 0 New Admissions / Manufacturing engineer / Ordering NPWT Apligraf, etc. , []  - 0 Emergency Hospital Admission (emergent condition) X- 1 10 Simple Discharge Coordination []  - 0 Complex (extensive) Discharge Coordination PROCESS - Special Needs []  - 0 Pediatric / Minor Patient Management []  - 0 Isolation Patient Management []  - 0 Hearing / Language / Visual special needs []  - 0 Assessment of Community assistance (transportation,  D/C planning, etc.) []  - 0 Additional assistance / Altered mentation []  - 0 Support Surface(s) Assessment (bed, cushion, seat, etc.) INTERVENTIONS - Wound Cleansing / Measurement []  - 0 Simple Wound Cleansing -  one wound X- 1 5 Complex Wound Cleansing - multiple wounds []  - 0 Wound Imaging (photographs - any number of wounds) []  - 0 Wound Tracing (instead of photographs) []  - 0 Simple Wound Measurement - one wound []  - 0 Complex Wound Measurement - multiple wounds INTERVENTIONS - Wound Dressings X - Small Wound Dressing one or multiple wounds 4 10 []  - 0 Medium Wound Dressing one or multiple wounds []  - 0 Large Wound Dressing one or multiple wounds []  - 0 Application of Medications - topical []  - 0 Application of Medications - injection INTERVENTIONS - Miscellaneous []  - 0 External ear exam []  - 0 Specimen Collection (cultures, biopsies, blood, body fluids, etc.) []  - 0 Specimen(s) / Culture(s) sent or taken to Lab for analysis []  - 0 Patient Transfer (multiple staff / Nurse, adult / Similar devices) []  - 0 Simple Staple / Suture removal (25 or less) []  - 0 Complex Staple / Suture removal (26 or more) []  - 0 Hypo / Hyperglycemic Management (close monitor of Blood Glucose) [name], [name] ([id number]) [id number] Page 3 of 9 []  - 0 Ankle / Brachial Index (ABI) - do not check if billed separately []  - 0 Vital Signs Has the patient been seen at the hospital within the last three years: Yes Total Score: 85 Level Of Care: New/Established - Level 3 Electronic Signature(s) Signed: 05/31/2023 4:03:39 PM By: Thayer Dallas Entered By: Thayer Dallas on 05/31/2023 08:14:15 -------------------------------------------------------------------------------- Encounter Discharge Information Details Patient Name: Date of Service: [name] MMA [name], [name] [name]. 05/31/2023 10:15 A M Medical Record Number: [id number] Patient Account Number: [id number] Date of  Birth/Sex: Treating RN: 1986/06/22 (37 y.o. F) Primary Care Kioni Stahl: Sanda Linger Other Clinician: Thayer Dallas Referring Brady Schiller: Treating Miles Leyda/Extender: Royston Sinner in Treatment: 9 Encounter Discharge Information Items Discharge Condition: Stable Ambulatory Status: Ambulatory Discharge Destination: Home Transportation: Private Auto Accompanied By: mother Schedule Follow-up Appointment: Yes Clinical Summary of Care: Electronic Signature(s) Signed: 05/31/2023 4:03:39 PM By: Thayer Dallas Entered By: Thayer Dallas on 05/31/2023 08:15:24 -------------------------------------------------------------------------------- Patient/Caregiver Education Details Patient Name: Date of Service: [name] MMA [name], [name] [name]. 11/25/2024andnbsp10:15 A M Medical Record Number: [id number] Patient Account Number: [id number] Date of Birth/Gender: Treating RN: February 07, 1986 (37 y.o. F) Primary Care Physician: Sanda Linger Other Clinician: Thayer Dallas Referring Physician: Treating Physician/Extender: Royston Sinner in Treatment: 9 Education Assessment Education Provided To: Patient Education Topics Provided Electronic Signature(s) Signed: 05/31/2023 4:03:39 PM By: Thayer Dallas Entered By: Thayer Dallas on 05/31/2023 08:15:05 Markus Jarvis [name] ([id number]) [id number] Page 4 of 9 -------------------------------------------------------------------------------- Wound Assessment Details Patient Name: Date of Service: [name] MMA [name] 05/31/2023 10:15 A M Medical Record Number: [id number] Patient Account Number: [id number] Date of Birth/Sex: Treating RN: Dec 13, 1985 (37 y.o. F) Primary Care Toluwani Ruder: Sanda Linger Other Clinician: Referring Damari Suastegui: Treating Jaz Laningham/Extender: Royston Sinner in Treatment: 9 Wound Status Wound Number: 1 Primary Etiology: Lymphedema Wound Location: Left,  Medial Foot Wound Status: Open Wounding Event: Gradually Appeared Date Acquired: 03/09/2023 Weeks Of Treatment: 9 Clustered Wound: Yes Wound Measurements Length: (cm) 0.3 Width: (cm) 0.3 Depth: (cm) 0.1 Area: (cm) 0.071 Volume: (cm) 0.007 % Reduction in Area: 99.9% % Reduction in Volume: 99.9% Wound Description Classification: Partial Thickness Exudate Amount: Small Exudate Type: Serosanguineous Exudate Color: red, brown Periwound Skin Texture Texture Color No Abnormalities Noted: No No Abnormalities Noted: No Moisture No Abnormalities Noted: No Treatment Notes Wound #1 (Foot) Wound Laterality: Left, Medial Cleanser Soap and Water Discharge  Instruction: May shower and wash wound with dial antibacterial soap and water prior to dressing change. Vashe 5.8 (oz) Discharge Instruction: Cleanse the wound with Vashe prior to applying a clean dressing using gauze sponges, not tissue or cotton balls. Peri-Wound Care Sween Lotion (Moisturizing lotion) Discharge Instruction: Apply moisturizing lotion as directed Topical Primary Dressing Promogran Prisma Matrix, 4.34 (sq in) (silver collagen) Discharge Instruction: Moisten collagen with saline or hydrogel Secondary Dressing ABD Pad, 8x10 Discharge Instruction: Apply over primary dressing as directed. Woven Gauze Sponge, Non-Sterile 4x4 in Discharge Instruction: Apply over primary dressing as directed. Secured With Compression Wrap Kerlix Roll 4.5x3.1 (in/yd) Discharge Instruction: Apply Kerlix and Coban compression as directed. [name], [name] ([id number]) [id number] Page 5 of 9 Coban Self-Adherent Wrap 4x5 (in/yd) Discharge Instruction: Apply over Kerlix as directed. Compression Stockings Add-Ons Electronic Signature(s) Signed: 05/31/2023 4:03:39 PM By: Thayer Dallas Entered By: Thayer Dallas on 05/31/2023  07:35:32 -------------------------------------------------------------------------------- Wound Assessment Details Patient Name: Date of Service: [name] MMA [name], [name] [name]. 05/31/2023 10:15 A M Medical Record Number: [id number] Patient Account Number: [id number] Date of Birth/Sex: Treating RN: 12/17/1985 (37 y.o. F) Primary Care Bayani Renteria: Sanda Linger Other Clinician: Referring Emmanuela Ghazi: Treating Jazmynn Pho/Extender: Royston Sinner in Treatment: 9 Wound Status Wound Number: 2 Primary Etiology: Lymphedema Wound Location: Left, Distal Foot Wound Status: Open Wounding Event: Gradually Appeared Date Acquired: 03/09/2023 Weeks Of Treatment: 9 Clustered Wound: Yes Wound Measurements Length: (cm) 1.2 Width: (cm) 2 Depth: (cm) 0.1 Area: (cm) 1.885 Volume: (cm) 0.188 % Reduction in Area: 92% % Reduction in Volume: 92% Wound Description Classification: Full Thickness Without Exposed Suppor Exudate Amount: Medium Exudate Type: Serosanguineous Exudate Color: red, brown t Structures Periwound Skin Texture Texture Color No Abnormalities Noted: No No Abnormalities Noted: No Moisture No Abnormalities Noted: No Treatment Notes Wound #2 (Foot) Wound Laterality: Left, Distal Cleanser Soap and Water Discharge Instruction: May shower and wash wound with dial antibacterial soap and water prior to dressing change. Vashe 5.8 (oz) Discharge Instruction: Cleanse the wound with Vashe prior to applying a clean dressing using gauze sponges, not tissue or cotton balls. Peri-Wound Care Sween Lotion (Moisturizing lotion) Discharge Instruction: Apply moisturizing lotion as directed Topical Primary Dressing Promogran Prisma Matrix, 4.34 (sq in) (silver collagen) [name], [name] ([id number]) [id number] Page 6 of 9 Discharge Instruction: Moisten collagen with saline or hydrogel Secondary Dressing ABD Pad, 8x10 Discharge Instruction: Apply over primary  dressing as directed. Woven Gauze Sponge, Non-Sterile 4x4 in Discharge Instruction: Apply over primary dressing as directed. Secured With Compression Wrap Kerlix Roll 4.5x3.1 (in/yd) Discharge Instruction: Apply Kerlix and Coban compression as directed. Coban Self-Adherent Wrap 4x5 (in/yd) Discharge Instruction: Apply over Kerlix as directed. Compression Stockings Add-Ons Electronic Signature(s) Signed: 05/31/2023 4:03:39 PM By: Thayer Dallas Entered By: Thayer Dallas on 05/31/2023 07:35:32 -------------------------------------------------------------------------------- Wound Assessment Details Patient Name: Date of Service: [name] MMA [name], [name] [name]. 05/31/2023 10:15 A M Medical Record Number: [id number] Patient Account Number: [id number] Date of Birth/Sex: Treating RN: 31-Mar-1986 (37 y.o. F) Primary Care Linde Wilensky: Sanda Linger Other Clinician: Referring Jonnathan Birman: Treating Netanya Yazdani/Extender: Royston Sinner in Treatment: 9 Wound Status Wound Number: 3 Primary Etiology: Lymphedema Wound Location: Left, Proximal, Dorsal Foot Wound Status: Open Wounding Event: Gradually Appeared Date Acquired: 03/09/2023 Weeks Of Treatment: 9 Clustered Wound: Yes Wound Measurements Length: (cm) 0.4 Width: (cm) 0.4 Depth: (cm) 0.1 Area: (cm) 0.126 Volume: (cm) 0.013 % Reduction in Area: 99.2% % Reduction in Volume: 99.6% Wound Description Classification: Partial Thickness Exudate Amount: Small Exudate  Type: Serosanguineous Exudate Color: red, brown Periwound Skin Texture Texture Color No Abnormalities Noted: No No Abnormalities Noted: No Moisture No Abnormalities Noted: No Treatment Notes Wound #3 (Foot) Wound Laterality: Dorsal, Left, Proximal [name], [name] ([id number]) [id number] Page 7 of 9 Soap and Water Discharge Instruction: May shower and wash wound with dial antibacterial soap and water prior to dressing  change. Vashe 5.8 (oz) Discharge Instruction: Cleanse the wound with Vashe prior to applying a clean dressing using gauze sponges, not tissue or cotton balls. Peri-Wound Care Sween Lotion (Moisturizing lotion) Discharge Instruction: Apply moisturizing lotion as directed Topical Primary Dressing Promogran Prisma Matrix, 4.34 (sq in) (silver collagen) Discharge Instruction: Moisten collagen with saline or hydrogel Secondary Dressing ABD Pad, 8x10 Discharge Instruction: Apply over primary dressing as directed. Woven Gauze Sponge, Non-Sterile 4x4 in Discharge Instruction: Apply over primary dressing as directed. Secured With Compression Wrap Kerlix Roll 4.5x3.1 (in/yd) Discharge Instruction: Apply Kerlix and Coban compression as directed. Coban Self-Adherent Wrap 4x5 (in/yd) Discharge Instruction: Apply over Kerlix as directed. Compression Stockings Add-Ons Electronic Signature(s) Signed: 05/31/2023 4:03:39 PM By: Thayer Dallas Entered By: Thayer Dallas on 05/31/2023 07:35:32 -------------------------------------------------------------------------------- Wound Assessment Details Patient Name: Date of Service: [name] MMA [name], [name] [name]. 05/31/2023 10:15 A M Medical Record Number: [id number] Patient Account Number: [id number] Date of Birth/Sex: Treating RN: Jan 26, 1986 (37 y.o. F) Primary Care Patrice Moates: Sanda Linger Other Clinician: Referring Nylen Creque: Treating Una Yeomans/Extender: Royston Sinner in Treatment: 9 Wound Status Wound Number: 4 Primary Etiology: Lymphedema Wound Location: Right, Dorsal Foot Wound Status: Open Wounding Event: Gradually Appeared Date Acquired: 03/09/2023 Weeks Of Treatment: 9 Clustered Wound: Yes Wound Measurements Length: (cm) 4.7 Width: (cm) 4.8 Depth: (cm) 0.2 Area: (cm) 17.719 Volume: (cm) 3.544 % Reduction in Area: 53% % Reduction in Volume: 6% Wound Description Classification: Full Thickness Without Exposed Support  Structures Exudate Amount: Medium Exudate Type: Serosanguineous Exudate Color: red, brown [name], [name] ([id number]) [id number] Page 8 of 9 Periwound Skin Texture Texture Color No Abnormalities Noted: No No Abnormalities Noted: No Moisture No Abnormalities Noted: No Treatment Notes Wound #4 (Foot) Wound Laterality: Dorsal, Right Cleanser Soap and Water Discharge Instruction: May shower and wash wound with dial antibacterial soap and water prior to dressing change. Vashe 5.8 (oz) Discharge Instruction: Cleanse the wound with Vashe prior to applying a clean dressing using gauze sponges, not tissue or cotton balls. Peri-Wound Care Sween Lotion (Moisturizing lotion) Discharge Instruction: Apply moisturizing lotion as directed Topical Primary Dressing Promogran Prisma Matrix, 4.34 (sq in) (silver collagen) Discharge Instruction: Moisten collagen with saline or hydrogel Secondary Dressing ABD Pad, 8x10 Discharge Instruction: Apply over primary dressing as directed. Woven Gauze Sponge, Non-Sterile 4x4 in Discharge Instruction: Apply over primary dressing as directed. Secured With Compression Wrap Kerlix Roll 4.5x3.1 (in/yd) Discharge Instruction: Apply Kerlix and Coban compression as directed. Coban Self-Adherent Wrap 4x5 (in/yd) Discharge Instruction: Apply over Kerlix as directed. Compression Stockings Add-Ons Electronic Signature(s) Signed: 05/31/2023 4:03:39 PM By: Thayer Dallas Entered By: Thayer Dallas on 05/31/2023 07:35:32 -------------------------------------------------------------------------------- Vitals Details Patient Name: Date of Service: [name] MMA [name], [name] [name]. 05/31/2023 10:15 A M Medical Record Number: [id number] Patient Account Number: [id number] Date of Birth/Sex: Treating RN: 1986-02-07 (37 y.o. F) Primary Care Amarie Tarte: Sanda Linger Other Clinician: Referring Blanche Gallien: Treating Preesha Benjamin/Extender: Royston Sinner in Treatment: 9 Vital Signs Time Taken: 11:10 Pulse (bpm): 106 Height (in): 69 Blood Pressure (mmHg): 157/98 Weight (lbs): 272 Reference Range: 80 - 120 mg / dl Body Mass Index (  BMI): 40.2 [name], [name] ([id number]) [id number] Page 9 of 9 Electronic Signature(s) Signed: 05/31/2023 4:03:39 PM By: Thayer Dallas Entered By: Thayer Dallas on 05/31/2023 08:14:39

## 2023-06-02 ENCOUNTER — Telehealth: Payer: Self-pay

## 2023-06-02 NOTE — Telephone Encounter (Signed)
Reaching out to patient for clarification on this new FMLA paperwork that her job has sent to [name]. Unfortunately I was unable to reach he patient. Left a very detailed message on the patient VM.

## 2023-06-07 ENCOUNTER — Encounter (HOSPITAL_BASED_OUTPATIENT_CLINIC_OR_DEPARTMENT_OTHER): Payer: BC Managed Care – PPO | Attending: Internal Medicine | Admitting: Internal Medicine

## 2023-06-07 DIAGNOSIS — L97518 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 severity: Secondary | ICD-10-CM | POA: Diagnosis not present

## 2023-06-07 DIAGNOSIS — L958 Other vasculitis limited to the skin: Secondary | ICD-10-CM | POA: Diagnosis not present

## 2023-06-07 DIAGNOSIS — L97528 Non-pressure chronic ulcer of other part of left foot with other specified severity: Secondary | ICD-10-CM | POA: Diagnosis not present

## 2023-06-07 DIAGNOSIS — I890 Lymphedema, not elsewhere classified: Secondary | ICD-10-CM | POA: Diagnosis not present

## 2023-06-09 NOTE — Progress Notes (Signed)
[name], [name] ([id number]) [id number] Page 1 of 10 Visit Report for 06/07/2023 Debridement Details Patient Name: Date of Service: [name] MMA [name] 06/07/2023 10:15 A M Medical Record Number: [id number] Patient Account Number: [id number] Date of Birth/Sex: Treating RN: [name]-[birth year] (37 y.o. F) Primary Care Provider: Sanda Linger Other Clinician: Referring Provider: Treating Provider/Extender: Madelyn Flavors in Treatment: 10 Debridement Performed for Assessment: Wound #1 Left,Medial Foot Performed By: Physician Maxwell Caul., MD The following information was scribed by: Zenaida Deed The information was scribed for: Baltazar Najjar Debridement Type: Debridement Level of Consciousness (Pre-procedure): Awake and Alert Pre-procedure Verification/Time Out Yes - 10:55 Taken: Start Time: 10:58 Pain Control: Lidocaine 4% Topical Solution Percent of Wound Bed Debrided: 100% T Area Debrided (cm): otal 0.07 Tissue and other material debrided: Non-Viable, Eschar Level: Non-Viable Tissue Debridement Description: Selective/Open Wound Instrument: Curette Bleeding: None Procedural Pain: 0 Post Procedural Pain: 0 Response to Treatment: Procedure was tolerated well Level of Consciousness (Post- Awake and Alert procedure): Post Debridement Measurements of Total Wound Length: (cm) 0.3 Width: (cm) 0.3 Depth: (cm) 0.1 Volume: (cm) 0.007 Character of Wound/Ulcer Post Debridement: Improved Post Procedure Diagnosis Same as Pre-procedure Electronic Signature(s) Signed: 06/07/2023 5:24:09 PM By: Baltazar Najjar MD Entered By: Baltazar Najjar on 06/07/2023 08:07:56 -------------------------------------------------------------------------------- HPI Details Patient Name: Date of Service: [name], [name] [name]. 06/07/2023 10:15 A M Medical Record Number: [id number] Patient Account Number: [id number] Date of Birth/Sex: Treating  RN: [name], [name] (37 y.o. F) Primary Care Provider: Sanda Linger Other Clinician: Referring Provider: Treating Provider/Extender: Madelyn Flavors in Treatment: 10 History of Present Illness [name], [name] ([id number]) [id number] Page 2 of 10 HPI Description: ADMISSION 03/23/2023 This is a 37 year old woman who is Story is somewhat difficult to follow however she apparently has developed bilateral lower extremity swelling involving her feet and ankles roughly 3 weeks ago. This but this became associated with small painful areas on predominantly her left greater than right foot with exfoliation of the skin. She was admitted to hospital from 03/16/2023 through 03/19/2023. X-ray of the feet was negative however she was noted to have a sedimentation rate greater than 90. Although she was empirically given antibiotics at the start of the admission. She was seen by infectious disease who did not feel this was infectious. It was felt that she required a skin biopsy for possible vasculitis. Dermatology and rheumatology consults were suggested. She was discharged from the hospital on a gradually tapering dose of prednisone. She had cryoglobulins, complement levels measured. She did not have an active urinalysis. She has been soaking her feet in ice water and cold towels to relieve the pain. She saw her dermatologist who is in [place] yesterday for consideration of a skin biopsy. Per the patient's description the dermatologist did not wish to do a skin biopsy stating that he did not want to create another wound area although I do not have his or her notes at this point and I do not know what diagnosis she was felt to have. She arrives in clinic today with discomfort in her feet. Exfoliating skin on the dorsal surfaces of both feet punched-out areas with necrosis especially on the left dorsal foot left lateral ankle. There is also areas on the right heel. Past  medical history includes acute hepatitis question involving alcohol or hepatitis A, bilateral leg edema, obesity, hypertension, history of psoriasis on Taltz, chronic pain, apparently some form of idiopathic peripheral neuropathy, vitamin B12 deficiency ABIs in our  clinic were 1.03 on the right 1.25 on the left Addendum 03/25/2023. I received the records from Dr. Cheree Ditto at [facility] dermatology in [place]. Looking at her record she felt that the issue was secondary to lymphedema and stasis dermatitis on both legs. I am not sure whether she looked at the areas on her feet which was the real area of concern. She specifically stated that she did not think that this was related to her psoriasis. Compression stockings were suggested. No biopsy was done 9/24; as noted above I did receive records from Dr. Cheree Ditto at [facility] dermatology and [place]. She felt the skin changes on her feet were related to lymphedema and stasis dermatitis. The patient arrived back in clinic the skin on her dorsal feet looks a lot better and she is in a lot less pain. HOWEVER she does have multiple small punched- out areas covered in black eschar. I did not biopsy this when I saw her the first time as she was going to dermatology the next day in retrospect that may not have been the right decision. 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 9/30; patient arrives in clinic with no complaints of pain at all. This is quite an improvement from the first time I saw her. T the punched out holes on her dorsal o feet and right ankle we have been using Santyl. The eschar is loosening to the point where it might be a reasonably easy debridement. We have also been using TCA and the skin on her feet looks a lot better. She is still on prednisone 40 mg. This was started tapering 60 mg 10 mg weekly in the hospital for vasculitis. Unfortunately no tissue  biopsy was done. 10/8; again no pain. The skin in her bilateral dorsal feet looks better than when she first came into the clinic. We have been wrapping her there is no real edema. She comes into the clinic with the wraps falling all the way down however I am not sure what the issue here is. As it turns out nobody is following the prednisone dosage. She went to her primary doctor he did not really address this she is currently on 30 mg of prednisone. As we do not have a diagnosis here I am going to taper the prednisone a little more aggressively and follow the condition of her legs. If she develops other lesions then we will have to think about this but right now we are keeping her on high doses of prednisone without a diagnosis I simply do not agree with this. 10/15; I have tapered her down to 10 mg of prednisone I put in for 5 mg tablets enough for 5 days. Counseled her about steroid withdrawal also the implications for rapid deterioration in her underlying status. As I mentioned previously no concrete diagnosis is available. She did not receive a biopsy. 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 She arrived in clinic today with multiple areas on her bilateral dorsal feet smaller or healed but still some areas that have a necrotic surface although the covering eschar is certainly less adherent that when I first saw her 10/22; starting the 5 mg of prednisone today. Still using Santyl, Hydrofera Blue on the open areas on her feet and ankles. We are still putting her in compression. Liberal use of triamcinolone under the compression The wounds are a  lot better today many of them are healed. No new wound areas 10/[name]; will be finished her prednisone by the middle part of this week. Still using Santyl, Hydrofera Blue and TCA under compression to her bilateral feet. There are no open wounds. Almost all of her open areas have a clean surface except for 1 area on the right dorsal  foot. She did not wish to be debrided this week rather she stated she would put that off till next week 11/4; everything continues to be a lot better. Several of the punched-out wounds have healed including the area on the right lateral calcaneus. We continue with Hydrofera Blue Santyl TCA widely under compression. She is not having any ill effects of stopping her prednisone she feels well. 11/11; the patient still has small open areas on the right dorsal foot, left medial foot left dorsal foot. No new wounds. Some of these wounds from last time if healed. No ill effects of stopping her prednisone. Using [facility] and Santyl 11/18; 2 wounds on the right dorsal foot and 1 on the left dorsal foot closely proximal to the third and fourth toes. All of these wounds much healthier in terms of granulation no debridement is required everything else is either closed or very close to it. There has been no new wounds no systemic symptoms 12/2; 2 wounds on the dorsal foot are smaller. She has 1 in the left dorsal foot and an eschared area on the left medial foot. She has not had any particular issues no new lesions are seen. She tells me she still has some pain in her feet that she relieves by walking around. 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 06/07/2023 10:15 A M Medical Record Number: [id number] Patient Account Number: [id number] Date of Birth/Sex: Treating RN: [name]-12-[name] (37 y.o. F) Primary Care Provider: Sanda Linger Other Clinician: Referring Provider: Treating  Provider/Extender: Madelyn Flavors in Treatment: 10 Constitutional Sitting or standing Blood Pressure is within target range for patient.. Pulse regular and within target range for patient.Marland Kitchen Respirations regular, non-labored and within target range.. Temperature is normal and within the target range for the patient.Marland Kitchen Appears in no distress. Notes Wound exam; clean granulated base on the right dorsal foot. Similarly on the left dorsal foot a small eschared area on the left medial foot I used a #3 curette to remove this the area underneath is not fully epithelialized but exceptionally closed. There is nothing here that really looks like return of her underlying precipitating illness [o Vasculitis] Electronic Signature(s) Signed: 06/07/2023 5:24:09 PM By: Baltazar Najjar MD Entered By: Baltazar Najjar on 06/07/2023 08:09:56 -------------------------------------------------------------------------------- Physician Orders Details Patient Name: Date of Service: [name], [name] [name]. 06/07/2023 10:15 A M Medical Record Number: [id number] Patient Account Number: [id number] Date of Birth/Sex: Treating RN: [name], [name] (37 y.o. 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 - Dr. Leanord Hawking -front office to schedule Return A Nurse Visit: - 1 week Other: - Kerlix and coban stops at mid lower  leg. Anesthetic (In clinic) Topical Lidocaine 4% applied to wound bed Bathing/ Shower/ Hygiene May shower with protection but do not get wound dressing(s) wet. Protect dressing(s) with water repellant cover (for example, large plastic bag) or a cast cover and may then take shower. Edema Control - Orders / Instructions Elevate legs to the level of the heart or above for 30 minutes daily and/or when sitting for 3-4 times a day throughout the day. Avoid standing for long periods of time. Exercise regularly Wound Treatment Wound #1 - Foot Wound Laterality: Left, Medial IYAHNA, LANGSAM [name] ([id number]) [id number] Page 4 of 10 Cleanser: Soap and Water 1 x Per Week/30 Days Discharge Instructions: May shower and wash wound with dial antibacterial soap and water prior to dressing change. Cleanser: Vashe 5.8 (oz) 1 x Per Week/30 Days Discharge Instructions: Cleanse the wound with Vashe prior to applying a clean dressing using gauze sponges, not tissue or cotton balls. Peri-Wound Care: Sween Lotion (Moisturizing lotion) 1 x Per Week/30 Days Discharge Instructions: Apply moisturizing lotion as directed Prim Dressing: Promogran Prisma Matrix, 4.34 (sq in) (silver collagen) 1 x Per Week/30 Days ary Discharge Instructions: Moisten collagen with saline or hydrogel Secondary Dressing: Woven Gauze Sponge, Non-Sterile 4x4 in 1 x Per Week/30 Days Discharge Instructions: Apply over primary dressing as directed. Compression Wrap: Kerlix Roll 4.5x3.1 (in/yd) 1 x Per Week/30 Days Discharge Instructions: Apply Kerlix and Coban compression as directed. Compression Wrap: Coban Self-Adherent Wrap 4x5 (in/yd) 1 x Per Week/30 Days Discharge Instructions: Apply over Kerlix as directed. Wound #2 - Foot Wound Laterality: Left, Distal Cleanser: Soap and Water 1 x Per Week/30 Days Discharge Instructions: May shower and wash wound with dial antibacterial soap and water prior to dressing  change. Cleanser: Vashe 5.8 (oz) 1 x Per Week/30 Days Discharge Instructions: Cleanse the wound with Vashe prior to applying a clean dressing using gauze sponges, not tissue or cotton balls. Peri-Wound Care: Sween Lotion (Moisturizing lotion) 1 x Per Week/30 Days Discharge Instructions: Apply moisturizing lotion as directed Prim Dressing: Promogran Prisma Matrix, 4.34 (sq in) (silver collagen) 1 x Per Week/30 Days ary Discharge Instructions: Moisten collagen with saline or hydrogel Secondary Dressing: Woven Gauze Sponge, Non-Sterile 4x4 in 1 x Per Week/30 Days Discharge Instructions: Apply over primary dressing as directed. Compression Wrap: Kerlix Roll 4.5x3.1 (in/yd) 1 x Per Week/30 Days Discharge Instructions: Apply Kerlix and Coban compression as directed. Compression Wrap: Coban Self-Adherent Wrap 4x5 (in/yd) 1 x Per Week/30 Days Discharge Instructions: Apply over Kerlix as directed. Wound #3 - Foot Wound Laterality: Dorsal, Left, Proximal Cleanser: Soap and Water 1 x Per Week/30 Days Discharge Instructions: May shower and wash wound with dial antibacterial soap and water prior to dressing change. Cleanser: Vashe 5.8 (oz) 1 x Per Week/30 Days Discharge Instructions: Cleanse the wound with Vashe prior to applying a clean dressing using gauze sponges, not tissue or cotton balls. Peri-Wound Care: Sween Lotion (Moisturizing lotion) 1 x Per Week/30 Days Discharge Instructions: Apply moisturizing lotion as directed Prim Dressing: Promogran Prisma Matrix, 4.34 (sq in) (silver collagen) 1 x Per Week/30 Days ary Discharge Instructions: Moisten collagen with saline or hydrogel Secondary Dressing: Woven Gauze Sponge, Non-Sterile 4x4 in 1 x Per  Week/30 Days Discharge Instructions: Apply over primary dressing as directed. Compression Wrap: Kerlix Roll 4.5x3.1 (in/yd) 1 x Per Week/30 Days Discharge Instructions: Apply Kerlix and Coban compression as directed. Compression Wrap: Coban Self-Adherent  Wrap 4x5 (in/yd) 1 x Per Week/30 Days Discharge Instructions: Apply over Kerlix as directed. Wound #4 - Foot Wound Laterality: Dorsal, Right Cleanser: Soap and Water 1 x Per Week/30 Days Discharge Instructions: May shower and wash wound with dial antibacterial soap and water prior to dressing change. Cleanser: Vashe 5.8 (oz) 1 x Per Week/30 Days Discharge Instructions: Cleanse the wound with Vashe prior to applying a clean dressing using gauze sponges, not tissue or cotton balls. Peri-Wound Care: Sween Lotion (Moisturizing lotion) 1 x Per Week/30 Days Discharge Instructions: Apply moisturizing lotion as directed Prim Dressing: Promogran Prisma Matrix, 4.34 (sq in) (silver collagen) ary 1 x Per Week/30 Days BRIANNIE, LAGOW [name] ([id number]) [phone].pdf Page 5 of 10 Discharge Instructions: Moisten collagen with saline or hydrogel Secondary Dressing: Woven Gauze Sponge, Non-Sterile 4x4 in 1 x Per Week/30 Days Discharge Instructions: Apply over primary dressing as directed. Compression Wrap: Kerlix Roll 4.5x3.1 (in/yd) 1 x Per Week/30 Days Discharge Instructions: Apply Kerlix and Coban compression as directed. Compression Wrap: Coban Self-Adherent Wrap 4x5 (in/yd) 1 x Per Week/30 Days Discharge Instructions: Apply over Kerlix as directed. Wound #6 - Foot Wound Laterality: Dorsal, Right, Distal Cleanser: Soap and Water 1 x Per Week/30 Days Discharge Instructions: May shower and wash wound with dial antibacterial soap and water prior to dressing change. Cleanser: Vashe 5.8 (oz) 1 x Per Week/30 Days Discharge Instructions: Cleanse the wound with Vashe prior to applying a clean dressing using gauze sponges, not tissue or cotton balls. Peri-Wound Care: Sween Lotion (Moisturizing lotion) 1 x Per Week/30 Days Discharge Instructions: Apply moisturizing lotion as directed Prim Dressing: Promogran Prisma Matrix, 4.34 (sq in) (silver collagen) 1 x Per Week/30 Days ary Discharge  Instructions: Moisten collagen with saline or hydrogel Secondary Dressing: Woven Gauze Sponge, Non-Sterile 4x4 in 1 x Per Week/30 Days Discharge Instructions: Apply over primary dressing as directed. Compression Wrap: Kerlix Roll 4.5x3.1 (in/yd) 1 x Per Week/30 Days Discharge Instructions: Apply Kerlix and Coban compression as directed. Compression Wrap: Coban Self-Adherent Wrap 4x5 (in/yd) 1 x Per Week/30 Days Discharge Instructions: Apply over Kerlix as directed. Electronic Signature(s) Signed: 06/07/2023 5:24:09 PM By: Baltazar Najjar MD Signed: 06/09/2023 11:53:[name] AM By: Zenaida Deed RN, BSN Entered By: Zenaida Deed on 06/07/2023 08:16:06 -------------------------------------------------------------------------------- Problem List Details Patient Name: Date of Service: [name], [name] [name]. 06/07/2023 10:15 A M Medical Record Number: [id number] Patient Account Number: [id number] Date of Birth/Sex: Treating RN: [name]-[birth year] (37 y.o. 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 06/07/2023 10:15 A  M Medical Record Number: [id number] Patient Account Number: [id number] Date of Birth/Sex: Treating RN: [name]/[birth year] (37 y.o. F) Primary Care Provider:  Sanda Linger Other Clinician: Referring Provider: Treating Provider/Extender: Madelyn Flavors in Treatment: 10 Subjective History of Present Illness (HPI) ADMISSION 03/23/2023 This is a 37 year old woman who is Story is somewhat difficult to follow however she apparently has developed bilateral lower extremity swelling involving her feet and ankles roughly 3 weeks ago. This but this became associated with small painful areas on predominantly her left greater than right foot with exfoliation of the skin. She was admitted to hospital from 03/16/2023 through 03/19/2023. X-ray of the feet was negative however she was noted to have a sedimentation rate greater than 90. Although she was empirically given antibiotics at the start of the admission. She was seen by infectious disease who did not feel this was infectious. It was felt that she required a skin biopsy for possible vasculitis. Dermatology and rheumatology consults were suggested. She was discharged from the hospital on a gradually tapering dose of prednisone. She had cryoglobulins, complement levels measured. She did not have an active urinalysis. She has been soaking her feet in ice water and cold towels to relieve the pain. She saw her dermatologist who is in [place] yesterday for consideration of a skin biopsy. Per the patient's description the dermatologist did not wish to do a skin biopsy stating that he did not want to create another wound area although I do not have his or her notes at this point and I do not know what diagnosis she was felt to have. She arrives in clinic today with discomfort in her feet. Exfoliating skin on the dorsal surfaces of both feet punched-out areas with necrosis especially on the left dorsal foot left lateral ankle. There is also areas on the  right heel. Past medical history includes acute hepatitis question involving alcohol or hepatitis A, bilateral leg edema, obesity, hypertension, history of psoriasis on Taltz, chronic pain, apparently some form of idiopathic peripheral neuropathy, vitamin B12 deficiency ABIs in our clinic were 1.03 on the right 1.25 on the left Addendum 03/25/2023. I received the records from Dr. Cheree Ditto at [facility] dermatology in [place]. Looking at her record she felt that the issue was secondary to lymphedema and stasis dermatitis on both legs. I am not sure whether she looked at the areas on her feet which was the real area of concern. She specifically stated that she did not think that this was related to her psoriasis. Compression stockings were suggested. No biopsy was done 9/24; as noted above I did receive records from Dr. Cheree Ditto at [facility] dermatology and [place]. She felt the skin changes on her feet were related to lymphedema and stasis dermatitis. The patient arrived back in clinic the skin on her dorsal feet looks a lot better and she is in a lot less pain. HOWEVER she does have multiple small punched- out areas covered in black eschar. I did not biopsy this when I saw her the first time as she was going to dermatology the next day in retrospect that may not have been the right decision. 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 9/30; patient arrives in clinic with no complaints of pain at all. This is quite an improvement from the first time I saw her. T the punched out holes on her dorsal o feet and right ankle we have been using Santyl. The eschar is loosening to the point where it might be a reasonably easy debridement. We  have also been using TCA and the skin on her feet looks a lot better. She is still on prednisone 40 mg. This was started tapering 60 mg 10 mg weekly in the hospital for vasculitis.  Unfortunately no tissue biopsy was done. 10/8; again no pain. The skin in her bilateral dorsal feet looks better than when she first came into the clinic. We have been wrapping her there is no real edema. She comes into the clinic with the wraps falling all the way down however I am not sure what the issue here is. As it turns out nobody is following the prednisone dosage. She went to her primary doctor he did not really address this she is currently on 30 mg of prednisone. As we do not have a diagnosis here I am going to taper the prednisone a little more aggressively and follow the condition of her legs. If she develops other lesions then we will have to think about this but right now we are keeping her on high doses of prednisone without a diagnosis I simply do not agree with this. 10/15; I have tapered her down to 10 mg of prednisone I put in for 5 mg tablets enough for 5 days. Counseled her about steroid withdrawal also the implications for rapid deterioration in her underlying status. As I mentioned previously no concrete diagnosis is available. She did not receive a biopsy. 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 She arrived in clinic today with multiple areas on her bilateral dorsal feet smaller or healed but still some areas that have a necrotic surface although the covering eschar is certainly less adherent that when I first saw her 10/22; starting the 5 mg of prednisone today. Still using Santyl, Hydrofera Blue on the open areas on her feet and ankles. We are still putting her in [place], [place] [name] ([id number]) [id number] Page 7 of 10 compression. Liberal use of triamcinolone under the compression The wounds are a lot better today many of them are healed. No new wound areas 10/[name]; will be finished her prednisone by the middle part of this week. Still using Santyl, Hydrofera Blue and TCA under compression to her bilateral feet.  There are no open wounds. Almost all of her open areas have a clean surface except for 1 area on the right dorsal foot. She did not wish to be debrided this week rather she stated she would put that off till next week 11/4; everything continues to be a lot better. Several of the punched-out wounds have healed including the area on the right lateral calcaneus. We continue with Hydrofera Blue Santyl TCA widely under compression. She is not having any ill effects of stopping her prednisone she feels well. 11/11; the patient still has small open areas on the right dorsal foot, left medial foot left dorsal foot. No new wounds. Some of these wounds from last time if healed. No ill effects of stopping her prednisone. Using [facility] and Santyl 11/18; 2 wounds on the right dorsal foot and 1 on the left dorsal foot closely proximal to the third and fourth toes. All of these wounds much healthier in terms of granulation no debridement is required everything else is either closed or very close to it. There has been no new wounds no systemic symptoms 12/2; 2 wounds on the dorsal foot are smaller. She has 1 in the left dorsal foot and an eschared area on the left medial foot. She has not had any  particular issues no new lesions are seen. She tells me she still has some pain in her feet that she relieves by walking around. 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 Pulse regular and within target range for patient.Marland Kitchen Respirations regular, non-labored and within target range.. Temperature is normal and within the target range for the patient.Marland Kitchen Appears in no distress. Vitals Time Taken: 10:15 AM, Height: 69 in, Weight: 272 lbs, BMI: 40.2, Temperature: 98.2 F, Pulse: 89 bpm, Respiratory Rate: 18 breaths/min, Blood Pressure: 114/75 mmHg. General Notes: Wound exam;  clean granulated base on the right dorsal foot. Similarly on the left dorsal foot a small eschared area on the left medial foot I used a #3 curette to remove this the area underneath is not fully epithelialized but exceptionally closed. There is nothing here that really looks like return of her underlying precipitating illness [o Vasculitis] Integumentary (Hair, Skin) Wound #1 status is Open. Original cause of wound was Gradually Appeared. The date acquired was: 03/09/2023. The wound has been in treatment 10 weeks. The wound is located on the Left,Medial Foot. The wound measures 0.1cm length x 0.1cm width x 0.1cm depth; 0.008cm^2 area and 0.001cm^3 volume. There is no tunneling or undermining noted. There is a none present amount of drainage noted. The wound margin is distinct with the outline attached to the wound base. There is no granulation within the wound bed. There is a large (67-100%) amount of necrotic tissue within the wound bed including Eschar. The periwound skin appearance had no abnormalities noted for texture. The periwound skin appearance had no abnormalities noted for moisture. The periwound skin appearance had no abnormalities noted for color. Periwound temperature was noted as No Abnormality. Wound #2 status is Open. Original cause of wound was Gradually Appeared. The date acquired was: 03/09/2023. The wound has been in treatment 10 weeks. The wound is located on the Left,Distal Foot. The wound measures 0.5cm length x 0.4cm width x 0.1cm depth; 0.157cm^2 area and 0.016cm^3 volume. There is Fat Layer (Subcutaneous Tissue) exposed. There is no tunneling or undermining noted. There is a small amount of serosanguineous drainage noted. The wound margin is flat and intact. There is large (67-100%) red granulation within the wound bed. There is no necrotic tissue within the wound bed. The periwound skin appearance had no abnormalities noted for texture. The periwound skin appearance had no  abnormalities noted for moisture. The periwound skin appearance had no abnormalities noted for color. Periwound temperature was noted as No Abnormality. Wound #3 status is Open. Original cause of wound was Gradually Appeared. The date acquired was: 03/09/2023. The wound has been in treatment 10 weeks. The wound is located on the Left,Proximal,Dorsal Foot. The wound measures 0.4cm length x 0.4cm width x 0.1cm depth; 0.126cm^2 area and 0.013cm^3 volume. There is a small amount of serosanguineous drainage noted. Wound #4 status is Open. Original cause of wound was Gradually Appeared. The date acquired was: 03/09/2023. The wound has been in treatment 10 weeks. The wound is located on the Right,Dorsal Foot. The wound measures 1.3cm length x 0.8cm width x 0.1cm depth; 0.817cm^2 area and 0.082cm^3 volume. There is Fat Layer (Subcutaneous Tissue) exposed. There is no tunneling or undermining noted. There is a medium amount of serosanguineous drainage noted. The wound margin is distinct with the outline attached to the wound base. There is medium (34-66%)  red granulation within the wound bed. There is a medium (34- 66%) amount of necrotic tissue within the wound bed including Adherent Slough. The periwound skin appearance had no abnormalities noted for texture. The periwound skin appearance had no abnormalities noted for moisture. The periwound skin appearance had no abnormalities noted for color. Periwound temperature was noted as No Abnormality. Wound #6 status is Open. Original cause of wound was Blister. The date acquired was: 06/07/2023. The wound is located on the Right,Distal,Dorsal Foot. The wound measures 0.8cm length x 1cm width x 0.1cm depth; 0.628cm^2 area and 0.063cm^3 volume. There is Fat Layer (Subcutaneous Tissue) exposed. There is no tunneling or undermining noted. There is a small amount of serosanguineous drainage noted. The wound margin is flat and intact. There is large (67-100%) red  granulation within the wound bed. There is a small (1-33%) amount of necrotic tissue within the wound bed including Adherent Slough. The periwound skin appearance had no abnormalities noted for texture. The periwound skin appearance had no abnormalities noted for moisture. The periwound skin appearance had no abnormalities noted for color. Periwound temperature was noted as No Abnormality. 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 There was a Selective/Open Wound Non-Viable Tissue Debridement with a total area of 0.07 sq cm performed by Maxwell Caul., MD. With the following instrument(s): Curette to remove Non-Viable tissue/material. Material removed includes Eschar after achieving pain control using Lidocaine 4% T opical Solution. No specimens were taken. A time out was conducted at 10:55, prior to the start of the procedure. There was no bleeding. The procedure was tolerated well with a pain level of 0 throughout and a pain level of 0 following the procedure. Post Debridement Measurements: 0.3cm length x 0.3cm width x 0.1cm depth; 0.007cm^3 volume. Character of Wound/Ulcer Post Debridement is improved. Post procedure Diagnosis Wound #1: Same as Pre-Procedure Plan Follow-up Appointments: Return Appointment in 2 weeks. - Dr. Leanord Hawking -front office to schedule Nurse Visit: - 1 week Other: - Kerlix and coban stops at mid lower leg. Anesthetic: (In clinic) Topical Lidocaine 4% applied to wound bed Bathing/ Shower/ Hygiene: May shower with protection but do not get wound dressing(s) wet. Protect dressing(s)  with water repellant cover (for example, large plastic bag) or a cast cover and may then take shower. Edema Control - Orders / Instructions: Elevate legs to the level of the heart or above for 30 minutes daily and/or when sitting for 3-4 times a day throughout the day. Avoid standing for long periods of time. Exercise regularly WOUND #1: - Foot Wound Laterality: Left, Medial Cleanser: Soap and Water 1 x Per Week/30 Days Discharge Instructions: May shower and wash wound with dial antibacterial soap and water prior to dressing change. Cleanser: Vashe 5.8 (oz) 1 x Per Week/30 Days Discharge Instructions: Cleanse the wound with Vashe prior to applying a clean dressing using gauze sponges, not tissue or cotton balls. Peri-Wound Care: Sween Lotion (Moisturizing lotion) 1 x Per Week/30 Days Discharge Instructions: Apply moisturizing lotion as directed Prim Dressing: Promogran Prisma Matrix, 4.34 (sq in) (silver collagen) 1 x Per Week/30 Days ary Discharge Instructions: Moisten collagen with saline or hydrogel Secondary Dressing: Woven Gauze Sponge, Non-Sterile 4x4  in 1 x Per Week/30 Days Discharge Instructions: Apply over primary dressing as directed. Com pression Wrap: Kerlix Roll 4.5x3.1 (in/yd) 1 x Per Week/30 Days Discharge Instructions: Apply Kerlix and Coban compression as directed. Com pression Wrap: Coban Self-Adherent Wrap 4x5 (in/yd) 1 x Per Week/30 Days Discharge Instructions: Apply over Kerlix as directed. WOUND #2: - Foot Wound Laterality: Left, Distal Cleanser: Soap and Water 1 x Per Week/30 Days Discharge Instructions: May shower and wash wound with dial antibacterial soap and water prior to dressing change. Cleanser: Vashe 5.8 (oz) 1 x Per Week/30 Days Discharge Instructions: Cleanse the wound with Vashe prior to applying a clean dressing using gauze sponges, not tissue or cotton balls. Peri-Wound Care: Sween Lotion (Moisturizing lotion) 1 x Per Week/30 Days Discharge  Instructions: Apply moisturizing lotion as directed Prim Dressing: Promogran Prisma Matrix, 4.34 (sq in) (silver collagen) 1 x Per Week/30 Days ary Discharge Instructions: Moisten collagen with saline or hydrogel Secondary Dressing: Woven Gauze Sponge, Non-Sterile 4x4 in 1 x Per Week/30 Days Discharge Instructions: Apply over primary dressing as directed. Com pression Wrap: Kerlix Roll 4.5x3.1 (in/yd) 1 x Per Week/30 Days Discharge Instructions: Apply Kerlix and Coban compression as directed. Com pression Wrap: Coban Self-Adherent Wrap 4x5 (in/yd) 1 x Per Week/30 Days Discharge Instructions: Apply over Kerlix as directed. WOUND #3: - Foot Wound Laterality: Dorsal, Left, Proximal Cleanser: Soap and Water 1 x Per Week/30 Days Discharge Instructions: May shower and wash wound with dial antibacterial soap and water prior to dressing change. Cleanser: Vashe 5.8 (oz) 1 x Per Week/30 Days Discharge Instructions: Cleanse the wound with Vashe prior to applying a clean dressing using gauze sponges, not tissue or cotton balls. Peri-Wound Care: Sween Lotion (Moisturizing lotion) 1 x Per Week/30 Days Discharge Instructions: Apply moisturizing lotion as directed Prim Dressing: Promogran Prisma Matrix, 4.34 (sq in) (silver collagen) 1 x Per Week/30 Days ary Discharge Instructions: Moisten collagen with saline or hydrogel Secondary Dressing: Woven Gauze Sponge, Non-Sterile 4x4 in 1 x Per Week/30 Days Discharge Instructions: Apply over primary dressing as directed. Com pression Wrap: Kerlix Roll 4.5x3.1 (in/yd) 1 x Per Week/30 Days Discharge Instructions: Apply Kerlix and Coban compression as directed. Com pression Wrap: Coban Self-Adherent Wrap 4x5 (in/yd) 1 x Per Week/30 Days Discharge Instructions: Apply over Kerlix as directed. WOUND #4: - Foot Wound Laterality: Dorsal, Right Cleanser: Soap and Water 1 x Per Week/30 Days Discharge Instructions: May shower and wash wound with dial antibacterial soap  and water prior to dressing change. Cleanser: Vashe 5.8 (oz) 1 x Per Week/30 Days Discharge Instructions: Cleanse the wound with Vashe prior to applying a clean dressing using gauze sponges, not tissue or cotton balls. Peri-Wound Care: Sween Lotion (Moisturizing lotion) 1 x Per Week/30 Days Discharge Instructions: Apply moisturizing lotion as directed Prim Dressing: Promogran Prisma Matrix, 4.34 (sq in) (silver collagen) 1 x Per Week/30 Days TESA, MAGISTRO [name] ([id number]) [phone].pdf Page 9 of 10 Discharge Instructions: Moisten collagen with saline or hydrogel Secondary Dressing: Woven Gauze Sponge, Non-Sterile 4x4 in 1 x Per Week/30 Days Discharge Instructions: Apply over primary dressing as directed. Com pression Wrap: Kerlix Roll 4.5x3.1 (in/yd) 1 x Per Week/30 Days Discharge Instructions: Apply Kerlix and Coban compression as directed. Com pression Wrap: Coban Self-Adherent Wrap 4x5 (in/yd) 1 x Per Week/30 Days Discharge Instructions: Apply over Kerlix as directed. WOUND #6: - Foot Wound Laterality: Dorsal, Right, Distal Cleanser: Soap and Water 1 x Per Week/30 Days Discharge Instructions: May shower and wash wound with dial antibacterial soap  and water prior to dressing change. Cleanser: Vashe 5.8 (oz) 1 x Per Week/30 Days Discharge Instructions: Cleanse the wound with Vashe prior to applying a clean dressing using gauze sponges, not tissue or cotton balls. Peri-Wound Care: Sween Lotion (Moisturizing lotion) 1 x Per Week/30 Days Discharge Instructions: Apply moisturizing lotion as directed Prim Dressing: Promogran Prisma Matrix, 4.34 (sq in) (silver collagen) 1 x Per Week/30 Days ary Discharge Instructions: Moisten collagen with saline or hydrogel Secondary Dressing: Woven Gauze Sponge, Non-Sterile 4x4 in 1 x Per Week/30 Days Discharge Instructions: Apply over primary dressing as directed. Com pression Wrap: Kerlix Roll 4.5x3.1 (in/yd) 1 x Per Week/30  Days Discharge Instructions: Apply Kerlix and Coban compression as directed. Com pression Wrap: Coban Self-Adherent Wrap 4x5 (in/yd) 1 x Per Week/30 Days Discharge Instructions: Apply over Kerlix as directed. 1. We have been using Prisma under an ABD, Kerlix Coban 2. All of these areas look as though they are slowly epithelializing. 3. No evidence of recurrence of the underlying precipitating condition here. 4. We had question brought up by long-term disability. T be truthful we were not even aware that the patient was working. This initially was prepared by his o primary physician. I think she should be able to return to work by the beginning of next month Psychologist, prison and probation services) Signed: 06/07/2023 5:24:09 PM By: Baltazar Najjar MD Signed: 06/09/2023 11:53:[name] AM By: Zenaida Deed RN, BSN Entered By: Zenaida Deed on 06/07/2023 08:16:33 -------------------------------------------------------------------------------- SuperBill Details Patient Name: Date of Service: [name], [name] [name]. 06/07/2023 Medical Record Number: [id number] Patient Account Number: [id number] Date of Birth/Sex: Treating RN: [birth year][name] (37 y.o. 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

## 2023-06-09 NOTE — Progress Notes (Signed)
[name], [name] ([id number]) [id number] Page 1 of 14 Visit Report for 06/07/2023 Arrival Information Details Patient Name: Date of Service: [name]. 06/07/2023 10:15 A M Medical Record Number: [id number] Patient Account Number: [id number] Date of Birth/Sex: Treating RN: Aug 14, 1985 (37 y.o. F) Primary Care [name]: [name] Other Clinician: Referring [name]: Treating [name]/Extender: [name] in Treatment: 10 Visit Information History Since Last Visit Added or deleted any medications: No Patient Arrived: Ambulatory Any new allergies or adverse reactions: No Arrival Time: 10:15 Had a fall or experienced change in No Accompanied By: mom activities of daily living that may affect Transfer Assistance: None risk of falls: Patient Identification Verified: Yes Signs or symptoms of abuse/neglect since last visito No Secondary Verification Process Completed: Yes Hospitalized since last visit: No Patient Requires Transmission-Based Precautions: No Implantable device outside of the clinic excluding No Patient Has Alerts: No cellular tissue based products placed in the center since last visit: Has Dressing in Place as Prescribed: Yes Has Compression in Place as Prescribed: Yes Pain Present Now: No Electronic Signature(s) Signed: 06/09/2023 11:53:29 AM By: [name] Entered By: [name] on 06/07/2023 10:31:09 -------------------------------------------------------------------------------- Encounter Discharge Information Details Patient Name: Date of Service: [name]. 06/07/2023 10:15 A M Medical Record Number: [id number] Patient Account Number: [id number] Date of Birth/Sex: Treating RN: 09/14/1985 (37 y.o. [name] Primary Care [name]: [name] Other Clinician: Referring [name]: Treating [name]/Extender: [name] in Treatment:  10 Encounter Discharge Information Items Post Procedure Vitals Discharge Condition: Stable Temperature (F): 98.2 Ambulatory Status: Ambulatory Pulse (bpm): 89 Discharge Destination: Home Respiratory Rate (breaths/min): 18 Transportation: Private Auto Blood Pressure (mmHg): 114/75 Accompanied By: mother Schedule Follow-up Appointment: Yes Clinical Summary of Care: Patient Declined Electronic Signature(s) Signed: 06/09/2023 11:53:29 AM By: [name] Entered By: [name] on 06/07/2023 11:17:34 [name] ([id number]) [id number] Page 2 of 14 -------------------------------------------------------------------------------- Lower Extremity Assessment Details Patient Name: Date of Service: [name]. 06/07/2023 10:15 A M Medical Record Number: [id number] Patient Account Number: [id number] Date of Birth/Sex: Treating RN: [birth year] (37 y.o. [name] Primary Care [name]: [name] Other Clinician: Referring [name]: Treating [name]/Extender: [name] in Treatment: 10 Edema Assessment Assessed: [name]: No] [name]: No] Edema: [Left: Yes] [Right: Yes] Calf Left: Right: Point of Measurement: 39 cm From Medial Instep 49 cm 47.5 cm Ankle Left: Right: Point of Measurement: 9 cm From Medial Instep 27 cm 25.5 cm Vascular Assessment Pulses: Dorsalis Pedis Palpable: [Left:Yes] [Right:Yes] Extremity colors, hair growth, and conditions: Extremity Color: [Left:Hyperpigmented] [Right:Hyperpigmented] Hair Growth on Extremity: [Left:No] [Right:No] Temperature of Extremity: [Left:Warm] [Right:Warm] Capillary Refill: [Left:< 3 seconds] [Right:< 3 seconds] Dependent Rubor: [Left:No No] [Right:No No] Electronic Signature(s) Signed: 06/09/2023 11:53:29 AM By: [name] Entered By: [name] on 06/07/2023  10:34:44 -------------------------------------------------------------------------------- Multi Wound Chart Details Patient Name: Date of Service: [name]. 06/07/2023 10:15 A M Medical Record Number: [id number] Patient Account Number: [id number] Date of Birth/Sex: Treating RN: 06-19-1986 (37 y.o. F) Primary Care [name]: [name] Other Clinician: Referring [name]: Treating [name]/Extender: [name] in Treatment: 10 Vital Signs Height(in): 69 Pulse(bpm): 89 Weight(lbs): 272 Blood Pressure(mmHg): 114/75 Body Mass Index(BMI): 40.2 Temperature(F): 98.2 Respiratory Rate(breaths/min): 18 [1:Photos:] [phone].pdf Page 3 of 14] Left, Medial Foot Left, Distal Foot Left, Proximal, Dorsal Foot Wound Location: Gradually Appeared Gradually Appeared Gradually Appeared Wounding Event: Lymphedema  Lymphedema Lymphedema Primary Etiology: Anemia, Sleep Apnea, Vasculitis, Anemia, Sleep Apnea, Vasculitis, Anemia, Sleep Apnea, Vasculitis, Comorbid History: Neuropathy Neuropathy Neuropathy 03/09/2023 03/09/2023 03/09/2023 Date Acquired: 10 10 10  Weeks of Treatment: Open Open Open Wound Status: No No No Wound Recurrence: Yes Yes Yes Clustered Wound: 0.1x0.1x0.1 0.5x0.4x0.1 0.4x0.4x0.1 Measurements L x W x D (cm) 0.008 0.157 0.126 A (cm) : rea 0.001 0.016 0.013 Volume (cm) : 100.00% 99.30% 99.20% % Reduction in A rea: 100.00% 99.30% 99.60% % Reduction in Volume: Partial Thickness Full Thickness Without Exposed Partial Thickness Classification: Support Structures None Present Small Small Exudate A mount: N/A Serosanguineous Serosanguineous Exudate Type: N/A red, brown red, brown Exudate Color: Distinct, outline attached Flat and Intact N/A Wound Margin: None Present (0%) Large (67-100%) N/A Granulation A mount: N/A Red N/A Granulation Quality: Large (67-100%) None Present (0%) N/A Necrotic A mount: Eschar  N/A N/A Necrotic Tissue: Fascia: No Fat Layer (Subcutaneous Tissue): Yes N/A Exposed Structures: Fat Layer (Subcutaneous Tissue): No Fascia: No Tendon: No Tendon: No Muscle: No Muscle: No Joint: No Joint: No Bone: No Bone: No Large (67-100%) Medium (34-66%) N/A Epithelialization: Debridement - Selective/Open Wound N/A N/A Debridement: Pre-procedure Verification/Time Out 10:55 N/A N/A Taken: Lidocaine 4% Topical Solution N/A N/A Pain Control: Necrotic/Eschar N/A N/A Tissue Debrided: Non-Viable Tissue N/A N/A Level: 0.07 N/A N/A Debridement A (sq cm): rea Curette N/A N/A Instrument: None N/A N/A Bleeding: 0 N/A N/A Procedural Pain: 0 N/A N/A Post Procedural Pain: Procedure was tolerated well N/A N/A Debridement Treatment Response: 0.3x0.3x0.1 N/A N/A Post Debridement Measurements L x W x D (cm) 0.007 N/A N/A Post Debridement Volume: (cm) No Abnormalities Noted No Abnormalities Noted Periwound Skin Texture: No Abnormalities Noted No Abnormalities Noted Periwound Skin Moisture: No Abnormalities Noted No Abnormalities Noted Periwound Skin Color: No Abnormality No Abnormality N/A Temperature: Debridement N/A N/A Procedures Performed: Wound Number: 4 6 N/A Photos: No Photos N/A Right, Dorsal Foot Right, Distal, Dorsal Foot N/A Wound Location: Gradually Appeared Blister N/A Wounding Event: Lymphedema Lymphedema N/A Primary Etiology: Anemia, Sleep Apnea, Vasculitis, Anemia, Sleep Apnea, Vasculitis, N/A Comorbid History: Neuropathy Neuropathy 03/09/2023 06/07/2023 N/A Date Acquired: 10 0 N/A Weeks of Treatment: Open Open N/A Wound Status: No No N/A Wound Recurrence: Yes No N/A Clustered Wound: 1.3x0.8x0.1 0.8x1x0.1 N/A Measurements L x W x D (cm) 0.817 0.628 N/A A (cm) : rea 0.082 0.063 N/A Volume (cm) : MAYTTE, HAMMERMEISTER [name] ([id number]) [id number] Page 4 of 14 97.80% N/A N/A % Reduction in Area: 97.80% N/A N/A %  Reduction in Volume: Full Thickness Without Exposed Full Thickness Without Exposed N/A Classification: Support Structures Support Structures Medium Small N/A Exudate A mount: Serosanguineous Serosanguineous N/A Exudate Type: red, brown red, brown N/A Exudate Color: Distinct, outline attached Flat and Intact N/A Wound Margin: Medium (34-66%) Large (67-100%) N/A Granulation A mount: Red Red N/A Granulation Quality: Medium (34-66%) Small (1-33%) N/A Necrotic A mount: Adherent Slough Adherent Slough N/A Necrotic Tissue: Fat Layer (Subcutaneous Tissue): Yes Fat Layer (Subcutaneous Tissue): Yes N/A Exposed Structures: Fascia: No Fascia: No Tendon: No Tendon: No Muscle: No Muscle: No Joint: No Joint: No Bone: No Bone: No Small (1-33%) Medium (34-66%) N/A Epithelialization: N/A N/A N/A Debridement: N/A N/A N/A Pain Control: N/A N/A N/A Tissue Debrided: N/A N/A N/A Level: N/A N/A N/A Debridement A (sq cm): rea N/A N/A N/A Instrument: N/A N/A N/A Bleeding: N/A N/A N/A Procedural Pain: N/A N/A N/A Post Procedural Pain: Debridement Treatment Response: N/A N/A N/A Post Debridement Measurements L x N/A N/A N/A  W x D (cm) N/A N/A N/A Post Debridement Volume: (cm) No Abnormalities Noted No Abnormalities Noted N/A Periwound Skin Texture: No Abnormalities Noted No Abnormalities Noted N/A Periwound Skin Moisture: No Abnormalities Noted No Abnormalities Noted N/A Periwound Skin Color: No Abnormality No Abnormality N/A Temperature: N/A N/A N/A Procedures Performed: Treatment Notes Electronic Signature(s) Signed: 06/07/2023 5:24:09 PM By: Baltazar Najjar MD Entered By: Baltazar Najjar on 06/07/2023 11:07:48 -------------------------------------------------------------------------------- Multi-Disciplinary Care Plan Details Patient Name: Date of Service: [name]. 06/07/2023 10:15 A M Medical Record Number: [id number] Patient Account Number:  [id number] Date of Birth/Sex: Treating RN: [birth year] (37 y.o. [name] Primary Care Krystalynn Ridgeway: [name] Other Clinician: Referring Gevin Perea: Treating Kisa Fujii/Extender: [name] in Treatment: 10 Multidisciplinary Care Plan reviewed with physician Active Inactive Pain, Acute or Chronic Nursing Diagnoses: Pain, acute or chronic: actual or potential Potential alteration in comfort, pain Goals: Patient will verbalize adequate pain control and receive pain control interventions during procedures as needed Date Initiated: 03/23/2023 Target Resolution Date: 07/02/2023 Goal Status: Active Patient/caregiver will verbalize comfort level met Date Initiated: 03/23/2023 Date Inactivated: 04/13/2023 Target Resolution Date: 04/13/2023 [name], [name] ([id number]) [phone].pdf Page 5 of 14 Goal Status: Met Interventions: Encourage patient to take pain medications as prescribed Provide education on pain management Provision of support: recognize patient pain, provide comfort and support as needed Treatment Activities: Administer pain control measures as ordered : 03/23/2023 Notes: Wound/Skin Impairment Nursing Diagnoses: Knowledge deficit related to ulceration/compromised skin integrity Goals: Patient/caregiver will verbalize understanding of skin care regimen Date Initiated: 03/23/2023 Target Resolution Date: 07/02/2023 Goal Status: Active Interventions: Assess patient/caregiver ability to obtain necessary supplies Assess patient/caregiver ability to perform ulcer/skin care regimen upon admission and as needed Provide education on ulcer and skin care Treatment Activities: Skin care regimen initiated : 03/23/2023 Topical wound management initiated : 03/23/2023 Notes: Electronic Signature(s) Signed: 06/09/2023 11:53:29 AM By: [name] Entered By: [name] on 06/07/2023  10:43:17 -------------------------------------------------------------------------------- Pain Assessment Details Patient Name: Date of Service: [name]. 06/07/2023 10:15 A M Medical Record Number: [id number] Patient Account Number: [id number] Date of Birth/Sex: Treating RN: Sep 20, 1985 (37 y.o. F) Primary Care Phillip Sandler: [name] Other Clinician: Referring Junius Faucett: Treating Madeeha Costantino/Extender: [name] in Treatment: 10 Active Problems Location of Pain Severity and Description of Pain Patient Has Paino No Site Locations Rate the pain. Current Pain Level: 0 Worst Pain Level: 4 Least Pain Level: 0 [name], [name] ([id number]) [id number].pdf Page 6 of 14 Character of Pain Describe the Pain: Aching Pain Management and Medication Current Pain Management: Medication: Yes Is the Current Pain Management Adequate: Adequate How does your wound impact your activities of daily livingo Sleep: No Bathing: No Appetite: No Relationship With Others: No Bladder Continence: No Emotions: No Bowel Continence: No Work: No Toileting: No Drive: No Dressing: No Hobbies: No Electronic Signature(s) Signed: 06/09/2023 11:53:29 AM By: [name] Entered By: [name] on 06/07/2023 10:31:49 -------------------------------------------------------------------------------- Patient/Caregiver Education Details Patient Name: Date of Service: [name]. 12/2/2024andnbsp10:15 A M Medical Record Number: [id number] Patient Account Number: [id number] Date of Birth/Gender: Treating RN: 1985-08-23 (37 y.o. [name] Primary Care Physician: [name] Other Clinician: Referring Physician: Treating Physician/Extender: [name] in Treatment: 10 Education Assessment Education Provided To: Patient Education Topics Provided Venous: Methods: Explain/Verbal Responses:  Reinforcements needed, State content correctly Wound/Skin Impairment: Methods: Explain/Verbal Responses: Reinforcements needed, State content correctly Electronic Signature(s)  Signed: 06/09/2023 11:53:29 AM By: [name] Entered By: [name] on 06/07/2023 10:43:40 -------------------------------------------------------------------------------- Wound Assessment Details Patient Name: Date of Service: [name]. 06/07/2023 10:15 A M Medical Record Number: [id number] Patient Account Number: [id number] Date of Birth/Sex: Treating RN: 19-Aug-1985 (37 y.o. F) Primary Care Shamona Wirtz: [name] Other Clinician: Referring Brandon Wiechman: Treating Analycia Khokhar/Extender: [name] in Treatment: [address] [place] [name] ([id number]) [id number] Page 7 of 14 Wound Status Wound Number: 1 Primary Etiology: Lymphedema Wound Location: Left, Medial Foot Wound Status: Open Wounding Event: Gradually Appeared Comorbid History: Anemia, Sleep Apnea, Vasculitis, Neuropathy Date Acquired: 03/09/2023 Weeks Of Treatment: 10 Clustered Wound: Yes Photos Wound Measurements Length: (cm) 0.1 Width: (cm) 0.1 Depth: (cm) 0.1 Area: (cm) 0.008 Volume: (cm) 0.001 % Reduction in Area: 100% % Reduction in Volume: 100% Epithelialization: Large (67-100%) Tunneling: No Undermining: No Wound Description Classification: Partial Thickness Wound Margin: Distinct, outline attached Exudate Amount: None Present Foul Odor After Cleansing: No Slough/Fibrino Yes Wound Bed Granulation Amount: None Present (0%) Exposed Structure Necrotic Amount: Large (67-100%) Fascia Exposed: No Necrotic Quality: Eschar Fat Layer (Subcutaneous Tissue) Exposed: No Tendon Exposed: No Muscle Exposed: No Joint Exposed: No Bone Exposed: No Periwound Skin Texture Texture Color No Abnormalities Noted: Yes No Abnormalities Noted: Yes Moisture Temperature / Pain No  Abnormalities Noted: Yes Temperature: No Abnormality Treatment Notes Wound #1 (Foot) Wound Laterality: Left, Medial Cleanser Soap and Water Discharge Instruction: May shower and wash wound with dial antibacterial soap and water prior to dressing change. Vashe 5.8 (oz) Discharge Instruction: Cleanse the wound with Vashe prior to applying a clean dressing using gauze sponges, not tissue or cotton balls. Peri-Wound Care Sween Lotion (Moisturizing lotion) Discharge Instruction: Apply moisturizing lotion as directed Topical Primary Dressing Promogran Prisma Matrix, 4.34 (sq in) (silver collagen) Discharge Instruction: Moisten collagen with saline or hydrogel Secondary Dressing Woven Gauze Sponge, Non-Sterile 4x4 in Discharge Instruction: Apply over primary dressing as directed. SHEERA, [name] ([id number]) [id number] Page 8 of 14 Secured With Compression Wrap Kerlix Roll 4.5x3.1 (in/yd) Discharge Instruction: Apply Kerlix and Coban compression as directed. Coban Self-Adherent Wrap 4x5 (in/yd) Discharge Instruction: Apply over Kerlix as directed. Compression Stockings Add-Ons Electronic Signature(s) Signed: 06/09/2023 11:53:29 AM By: [name] Entered By: [name] on 06/07/2023 10:39:01 -------------------------------------------------------------------------------- Wound Assessment Details Patient Name: Date of Service: [name]. 06/07/2023 10:15 A M Medical Record Number: [id number] Patient Account Number: [id number] Date of Birth/Sex: Treating RN: May 03, 1986 (37 y.o. [name] Primary Care Yajayra Feldt: [name] Other Clinician: Referring Delmer Kowalski: Treating Tyrone Pautsch/Extender: [name] in Treatment: 10 Wound Status Wound Number: 2 Primary Etiology: Lymphedema Wound Location: Left, Distal Foot Wound Status: Open Wounding Event: Gradually Appeared Comorbid History: Anemia, Sleep  Apnea, Vasculitis, Neuropathy Date Acquired: 03/09/2023 Weeks Of Treatment: 10 Clustered Wound: Yes Photos Wound Measurements Length: (cm) 0.5 Width: (cm) 0.4 Depth: (cm) 0.1 Area: (cm) 0.157 Volume: (cm) 0.016 % Reduction in Area: 99.3% % Reduction in Volume: 99.3% Epithelialization: Medium (34-66%) Tunneling: No Undermining: No Wound Description Classification: Full Thickness Without Exposed Sup Wound Margin: Flat and Intact Exudate Amount: Small Exudate Type: Serosanguineous Exudate Color: red, brown port Structures Wound Bed Granulation Amount: Large (67-100%) Exposed Structure Granulation Quality: Red Fascia Exposed: No Necrotic Amount: None Present (0%) Fat Layer (Subcutaneous Tissue) Exposed: Yes SHEREEN, VOYTKO [name] ([id number]) [id number] Page 9 of 14 Tendon Exposed: No Muscle Exposed: No Joint Exposed: No Bone Exposed: No Periwound Skin Texture  Texture Color No Abnormalities Noted: Yes No Abnormalities Noted: Yes Moisture Temperature / Pain No Abnormalities Noted: Yes Temperature: No Abnormality Treatment Notes Wound #2 (Foot) Wound Laterality: Left, Distal Cleanser Soap and Water Discharge Instruction: May shower and wash wound with dial antibacterial soap and water prior to dressing change. Vashe 5.8 (oz) Discharge Instruction: Cleanse the wound with Vashe prior to applying a clean dressing using gauze sponges, not tissue or cotton balls. Peri-Wound Care Sween Lotion (Moisturizing lotion) Discharge Instruction: Apply moisturizing lotion as directed Topical Primary Dressing Promogran Prisma Matrix, 4.34 (sq in) (silver collagen) Discharge Instruction: Moisten collagen with saline or hydrogel Secondary Dressing Woven Gauze Sponge, Non-Sterile 4x4 in Discharge Instruction: Apply over primary dressing as directed. Secured With Compression Wrap Kerlix Roll 4.5x3.1 (in/yd) Discharge Instruction: Apply Kerlix and Coban  compression as directed. Coban Self-Adherent Wrap 4x5 (in/yd) Discharge Instruction: Apply over Kerlix as directed. Compression Stockings Add-Ons Electronic Signature(s) Signed: 06/09/2023 11:53:29 AM By: [name] Entered By: [name] on 06/07/2023 10:37:40 -------------------------------------------------------------------------------- Wound Assessment Details Patient Name: Date of Service: [name]. 06/07/2023 10:15 A M Medical Record Number: [id number] Patient Account Number: [id number] Date of Birth/Sex: Treating RN: 1985-09-16 (37 y.o. F) Primary Care Guinn Delarosa: [name] Other Clinician: Referring Sierra Bissonette: Treating Jshon Ibe/Extender: [name] in Treatment: 10 Wound Status Wound Number: 3 Primary Etiology: Lymphedema Wound Location: Left, Proximal, Dorsal Foot Wound Status: Open Wounding Event: Gradually Appeared Comorbid History: Anemia, Sleep Apnea, Vasculitis, Neuropathy Date Acquired: 03/09/2023 [facility] Of Treatment: [address] [name] ([id number]) [phone].pdf Page 10 of 14 Clustered Wound: Yes Photos Wound Measurements Length: (cm) 0.4 Width: (cm) 0.4 Depth: (cm) 0.1 Area: (cm) 0.126 Volume: (cm) 0.013 % Reduction in Area: 99.2% % Reduction in Volume: 99.6% Wound Description Classification: Partial Thickness Exudate Amount: Small Exudate Type: Serosanguineous Exudate Color: red, brown Periwound Skin Texture Texture Color No Abnormalities Noted: No No Abnormalities Noted: No Moisture No Abnormalities Noted: No Treatment Notes Wound #3 (Foot) Wound Laterality: Dorsal, Left, Proximal Cleanser Soap and Water Discharge Instruction: May shower and wash wound with dial antibacterial soap and water prior to dressing change. Vashe 5.8 (oz) Discharge Instruction: Cleanse the wound with Vashe prior to applying a clean dressing using gauze sponges, not tissue or cotton  balls. Peri-Wound Care Sween Lotion (Moisturizing lotion) Discharge Instruction: Apply moisturizing lotion as directed Topical Primary Dressing Promogran Prisma Matrix, 4.34 (sq in) (silver collagen) Discharge Instruction: Moisten collagen with saline or hydrogel Secondary Dressing Woven Gauze Sponge, Non-Sterile 4x4 in Discharge Instruction: Apply over primary dressing as directed. Secured With Compression Wrap Kerlix Roll 4.5x3.1 (in/yd) Discharge Instruction: Apply Kerlix and Coban compression as directed. Coban Self-Adherent Wrap 4x5 (in/yd) Discharge Instruction: Apply over Kerlix as directed. Compression Stockings Add-Ons Electronic Signature(s) Signed: 06/07/2023 1:18:35 PM By: Luberta Robertson, [place] Dragon [name] [phone] Scrape [phone].pdf Page 11 of 14 Signed: 06/07/2023 1:18:35 PM Entered By: Dayton Scrape on 06/07/2023 10:30:25 -------------------------------------------------------------------------------- Wound Assessment Details Patient Name: Date of Service: [name]. 06/07/2023 10:15 A M Medical Record Number: [id number] Patient Account Number: [id number] Date of Birth/Sex: Treating RN: [birth year] (37 y.o. [name] Primary Care Gracelin Weisberg: [name] Other Clinician: Referring Mirren Gest: Treating Cohl Behrens/Extender: [name] in Treatment: 10 Wound Status Wound Number: 4 Primary Etiology: Lymphedema Wound Location: Right, Dorsal Foot Wound Status: Open Wounding Event: Gradually Appeared Comorbid History: Anemia, Sleep Apnea, Vasculitis, Neuropathy Date Acquired: 03/09/2023 Weeks Of Treatment: 10 Clustered Wound: Yes Photos Wound  Measurements Length: (cm) 1.3 Width: (cm) 0.8 Depth: (cm) 0.1 Area: (cm) 0.817 Volume: (cm) 0.082 % Reduction in Area: 97.8% % Reduction in Volume: 97.8% Epithelialization: Small (1-33%) Tunneling: No Undermining: No Wound  Description Classification: Full Thickness Without Exposed Suppor Wound Margin: Distinct, outline attached Exudate Amount: Medium Exudate Type: Serosanguineous Exudate Color: red, brown t Structures Foul Odor After Cleansing: No Slough/Fibrino Yes Wound Bed Granulation Amount: Medium (34-66%) Exposed Structure Granulation Quality: Red Fascia Exposed: No Necrotic Amount: Medium (34-66%) Fat Layer (Subcutaneous Tissue) Exposed: Yes Necrotic Quality: Adherent Slough Tendon Exposed: No Muscle Exposed: No Joint Exposed: No Bone Exposed: No Periwound Skin Texture Texture Color No Abnormalities Noted: Yes No Abnormalities Noted: Yes Moisture Temperature / Pain No Abnormalities Noted: Yes Temperature: No Abnormality Treatment Notes Wound #4 (Foot) Wound Laterality: Dorsal, Right [name], [name] ([id number]) [id number] Page 12 of 14 Cleanser Soap and Water Discharge Instruction: May shower and wash wound with dial antibacterial soap and water prior to dressing change. Vashe 5.8 (oz) Discharge Instruction: Cleanse the wound with Vashe prior to applying a clean dressing using gauze sponges, not tissue or cotton balls. Peri-Wound Care Sween Lotion (Moisturizing lotion) Discharge Instruction: Apply moisturizing lotion as directed Topical Primary Dressing Promogran Prisma Matrix, 4.34 (sq in) (silver collagen) Discharge Instruction: Moisten collagen with saline or hydrogel Secondary Dressing Woven Gauze Sponge, Non-Sterile 4x4 in Discharge Instruction: Apply over primary dressing as directed. Secured With Compression Wrap Kerlix Roll 4.5x3.1 (in/yd) Discharge Instruction: Apply Kerlix and Coban compression as directed. Coban Self-Adherent Wrap 4x5 (in/yd) Discharge Instruction: Apply over Kerlix as directed. Compression Stockings Add-Ons Electronic Signature(s) Signed: 06/09/2023 11:53:29 AM By: [name] Entered By: [name] on  06/07/2023 10:38:20 -------------------------------------------------------------------------------- Wound Assessment Details Patient Name: Date of Service: [name]. 06/07/2023 10:15 A M Medical Record Number: [id number] Patient Account Number: [id number] Date of Birth/Sex: Treating RN: 1986/03/05 (37 y.o. [name] Primary Care Annisten Manchester: [name] Other Clinician: Referring Solangel Mcmanaway: Treating Jissell Trafton/Extender: [name] in Treatment: 10 Wound Status Wound Number: 6 Primary Etiology: Lymphedema Wound Location: Right, Distal, Dorsal Foot Wound Status: Open Wounding Event: Blister Comorbid History: Anemia, Sleep Apnea, Vasculitis, Neuropathy Date Acquired: 06/07/2023 Weeks Of Treatment: 0 Clustered Wound: No Wound Measurements Length: (cm) 0.8 Width: (cm) 1 Depth: (cm) 0.1 Area: (cm) 0.628 Volume: (cm) 0.063 % Reduction in Area: % Reduction in Volume: Epithelialization: Medium (34-66%) Tunneling: No Undermining: No Wound Description Classification: Full Thickness Without Exposed Support Structures Wound Margin: Flat and Intact Exudate Amount: Small Exudate Type: Serosanguineous Mizrachi, [name] ([id number]) Exudate Color: red, brown Foul Odor After Cleansing: No Slough/Fibrino Yes [id number] Page 13 of 14 Wound Bed Granulation Amount: Large (67-100%) Exposed Structure Granulation Quality: Red Fascia Exposed: No Necrotic Amount: Small (1-33%) Fat Layer (Subcutaneous Tissue) Exposed: Yes Necrotic Quality: Adherent Slough Tendon Exposed: No Muscle Exposed: No Joint Exposed: No Bone Exposed: No Periwound Skin Texture Texture Color No Abnormalities Noted: Yes No Abnormalities Noted: Yes Moisture Temperature / Pain No Abnormalities Noted: Yes Temperature: No Abnormality Treatment Notes Wound #6 (Foot) Wound Laterality: Dorsal, Right, Distal Cleanser Soap and Water Discharge  Instruction: May shower and wash wound with dial antibacterial soap and water prior to dressing change. Vashe 5.8 (oz) Discharge Instruction: Cleanse the wound with Vashe prior to applying a clean dressing using gauze sponges, not tissue or cotton balls. Peri-Wound Care Sween Lotion (Moisturizing lotion) Discharge Instruction: Apply moisturizing lotion as directed Topical Primary Dressing Promogran Prisma Matrix, 4.34 (sq in) (  silver collagen) Discharge Instruction: Moisten collagen with saline or hydrogel Secondary Dressing Woven Gauze Sponge, Non-Sterile 4x4 in Discharge Instruction: Apply over primary dressing as directed. Secured With Compression Wrap Kerlix Roll 4.5x3.1 (in/yd) Discharge Instruction: Apply Kerlix and Coban compression as directed. Coban Self-Adherent Wrap 4x5 (in/yd) Discharge Instruction: Apply over Kerlix as directed. Compression Stockings Add-Ons Electronic Signature(s) Signed: 06/09/2023 11:53:29 AM By: [name] Entered By: [name] on 06/07/2023 10:40:47 -------------------------------------------------------------------------------- Vitals Details Patient Name: Date of Service: [name]. 06/07/2023 10:15 A M Medical Record Number: [id number] Patient Account Number: [id number] Date of Birth/Sex: Treating RN: [birth year] (37 y.o. F) Primary Care Houa Ackert: [name] Other Clinician: Referring Matylda Fehring: Treating Jamielynn Wigley/Extender: [name] in Treatment: [address], [name] ([id number]) [id number] Page 14 of 14 Vital Signs Time Taken: 10:15 Temperature (F): 98.2 Height (in): 69 Pulse (bpm): 89 Weight (lbs): 272 Respiratory Rate (breaths/min): 18 Body Mass Index (BMI): 40.2 Blood Pressure (mmHg): 114/75 Reference Range: 80 - 120 mg / dl Electronic Signature(s) Signed: 06/09/2023 11:53:29 AM By: [name] Entered By: [name] on 06/07/2023  10:45:06

## 2023-06-14 ENCOUNTER — Encounter (HOSPITAL_BASED_OUTPATIENT_CLINIC_OR_DEPARTMENT_OTHER): Payer: BC Managed Care – PPO | Admitting: Internal Medicine

## 2023-06-14 DIAGNOSIS — L958 Other vasculitis limited to the skin: Secondary | ICD-10-CM | POA: Diagnosis not present

## 2023-06-14 DIAGNOSIS — L97518 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 severity: Secondary | ICD-10-CM | POA: Diagnosis not present

## 2023-06-14 DIAGNOSIS — L97528 Non-pressure chronic ulcer of other part of left foot with other specified severity: Secondary | ICD-10-CM | POA: Diagnosis not present

## 2023-06-14 NOTE — Progress Notes (Signed)
[name], [name] ([id number]) [id number] Page 1 of 1 Visit Report for 06/14/2023 SuperBill Details Patient Name: [name], [name]. Date of Service: 06/14/2023 Medical Record Number: [id number] Patient Account Number: [id number] Date of Birth/Sex: Treating RN: 21-Feb-1986 (37 y.o. 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

## 2023-06-14 NOTE — Progress Notes (Signed)
MAXIME, NEEF R ([id number]) [id number] Page 1 of 10 Visit Report for 06/14/2023 Arrival Information Details Patient Name: Date of Service: [name] NDRA R. 06/14/2023 11:15 A M Medical Record Number: [id number] Patient Account Number: [id number] Date of Birth/Sex: Treating RN: Feb 04, 1986 (37 y.o. F) Primary Care Kailany Dinunzio: Sanda Linger Other Clinician: Thayer Dallas Referring Quinlyn Tep: Treating Olvin Rohr/Extender: Madelyn Flavors in Treatment: 11 Visit Information History Since Last Visit Added or deleted any medications: No Patient Arrived: Gilmer Mor Any new allergies or adverse reactions: No Arrival Time: 11:10 Had a fall or experienced change in No Accompanied By: mom activities of daily living that may affect Transfer Assistance: None risk of falls: Patient Requires Transmission-Based Precautions: No Signs or symptoms of abuse/neglect since last visito No Patient Has Alerts: No Hospitalized since last visit: No Implantable device outside of the clinic excluding No cellular tissue based products placed in the center since last visit: Has Dressing in Place as Prescribed: Yes Pain Present Now: No Electronic Signature(s) Signed: 06/14/2023 4:12:01 PM By: Thayer Dallas Entered By: Thayer Dallas on 06/14/2023 11:59:30 -------------------------------------------------------------------------------- Clinic Level of Care Assessment Details Patient Name: Date of Service: [name] NDRA R. 06/14/2023 11:15 A M Medical Record Number: [id number] Patient Account Number: [id number] Date of Birth/Sex: Treating RN: 1985-08-25 (37 y.o. F) Primary Care Dejae Bernet: Sanda Linger Other Clinician: Thayer Dallas Referring Richad Ramsay: Treating Braedon Sjogren/Extender: Madelyn Flavors in Treatment: 11 Clinic Level of Care Assessment Items TOOL 4 Quantity Score X- 1 0 Use when only an EandM is performed on FOLLOW-UP  visit ASSESSMENTS - Nursing Assessment / Reassessment X- 1 10 Reassessment of Co-morbidities (includes updates in patient status) X- 1 5 Reassessment of Adherence to Treatment Plan ASSESSMENTS - Wound and Skin A ssessment / Reassessment []  - 0 Simple Wound Assessment / Reassessment - one wound X- 3 5 Complex Wound Assessment / Reassessment - multiple wounds []  - 0 Dermatologic / Skin Assessment (not related to wound area) ASSESSMENTS - Focused Assessment []  - 0 Circumferential Edema Measurements - multi extremities []  - 0 Nutritional Assessment / Counseling / Intervention CORETTA, TRAMA R ([id number]) [id number] Page 2 of 10 []  - 0 Lower Extremity Assessment (monofilament, tuning fork, pulses) []  - 0 Peripheral Arterial Disease Assessment (using hand held doppler) ASSESSMENTS - Ostomy and/or Continence Assessment and Care []  - 0 Incontinence Assessment and Management []  - 0 Ostomy Care Assessment and Management (repouching, etc.) PROCESS - Coordination of Care X - Simple Patient / Family Education for ongoing care 1 15 []  - 0 Complex (extensive) Patient / Family Education for ongoing care []  - 0 Staff obtains Chiropractor, Records, T Results / Process Orders est []  - 0 Staff telephones HHA, Nursing Homes / Clarify orders / etc []  - 0 Routine Transfer to another Facility (non-emergent condition) []  - 0 Routine Hospital Admission (non-emergent condition) []  - 0 New Admissions / Manufacturing engineer / Ordering NPWT Apligraf, etc. , []  - 0 Emergency Hospital Admission (emergent condition) X- 1 10 Simple Discharge Coordination []  - 0 Complex (extensive) Discharge Coordination PROCESS - Special Needs []  - 0 Pediatric / Minor Patient Management []  - 0 Isolation Patient Management []  - 0 Hearing / Language / Visual special needs []  - 0 Assessment of Community assistance (transportation, D/C planning, etc.) []  - 0 Additional assistance /  Altered mentation []  - 0 Support Surface(s) Assessment (bed, cushion, seat, etc.) INTERVENTIONS - Wound Cleansing / Measurement []  - 0 Simple Wound Cleansing - one wound X- 3  5 Complex Wound Cleansing - multiple wounds []  - 0 Wound Imaging (photographs - any number of wounds) []  - 0 Wound Tracing (instead of photographs) []  - 0 Simple Wound Measurement - one wound []  - 0 Complex Wound Measurement - multiple wounds INTERVENTIONS - Wound Dressings X - Small Wound Dressing one or multiple wounds 3 10 []  - 0 Medium Wound Dressing one or multiple wounds []  - 0 Large Wound Dressing one or multiple wounds []  - 0 Application of Medications - topical []  - 0 Application of Medications - injection INTERVENTIONS - Miscellaneous []  - 0 External ear exam []  - 0 Specimen Collection (cultures, biopsies, blood, body fluids, etc.) []  - 0 Specimen(s) / Culture(s) sent or taken to Lab for analysis []  - 0 Patient Transfer (multiple staff / Nurse, adult / Similar devices) []  - 0 Simple Staple / Suture removal (25 or less) []  - 0 Complex Staple / Suture removal (26 or more) []  - 0 Hypo / Hyperglycemic Management (close monitor of Blood Glucose) Davalos, Veleta R ([id number]) [id number] Page 3 of 10 []  - 0 Ankle / Brachial Index (ABI) - do not check if billed separately []  - 0 Vital Signs Has the patient been seen at the hospital within the last three years: Yes Total Score: 100 Level Of Care: New/Established - Level 3 Electronic Signature(s) Signed: 06/14/2023 4:12:01 PM By: Thayer Dallas Entered By: Thayer Dallas on 06/14/2023 12:06:26 -------------------------------------------------------------------------------- Encounter Discharge Information Details Patient Name: Date of Service: [name], [name] 06/14/2023 11:15 A M Medical Record Number: [id number] Patient Account Number: [id number] Date of Birth/Sex: Treating RN: 1986-05-22 (37 y.o. F) Primary  Care Makai Dumond: Sanda Linger Other Clinician: Thayer Dallas Referring Ayyub Krall: Treating Merik Mignano/Extender: Madelyn Flavors in Treatment: 11 Encounter Discharge Information Items Discharge Condition: Stable Ambulatory Status: Ambulatory Discharge Destination: Home Transportation: Private Auto Accompanied By: mother Schedule Follow-up Appointment: Yes Clinical Summary of Care: Electronic Signature(s) Signed: 06/14/2023 4:12:01 PM By: Thayer Dallas Entered By: Thayer Dallas on 06/14/2023 12:07:29 -------------------------------------------------------------------------------- Patient/Caregiver Education Details Patient Name: Date of Service: [name], [name] 12/9/2024andnbsp11:15 A M Medical Record Number: [id number] Patient Account Number: [id number] Date of Birth/Gender: Treating RN: 03/13/1986 (37 y.o. F) Primary Care Physician: Sanda Linger Other Clinician: Thayer Dallas Referring Physician: Treating Physician/Extender: Madelyn Flavors in Treatment: 11 Education Assessment Education Provided To: Patient Education Topics Provided Electronic Signature(s) Signed: 06/14/2023 4:12:01 PM By: Thayer Dallas Entered By: Thayer Dallas on 06/14/2023 12:07:09 [name] ([id number]) [id number] Page 4 of 10 -------------------------------------------------------------------------------- Wound Assessment Details Patient Name: Date of Service: [name] NDRA R. 06/14/2023 11:15 A M Medical Record Number: [id number] Patient Account Number: [id number] Date of Birth/Sex: Treating RN: [birth year] (37 y.o. F) Primary Care Christyna Letendre: Sanda Linger Other Clinician: Thayer Dallas Referring Jacoby Zanni: Treating Vennie Waymire/Extender: Madelyn Flavors in Treatment: 11 Wound Status Wound Number: 1 Primary Etiology: Lymphedema Wound Location: Left, Medial Foot Wound Status: Open Wounding  Event: Gradually Appeared Date Acquired: 03/09/2023 Weeks Of Treatment: 11 Clustered Wound: Yes Wound Measurements Length: (cm) 0.1 Width: (cm) 0.1 Depth: (cm) 0.1 Area: (cm) 0.008 Volume: (cm) 0.001 % Reduction in Area: 100% % Reduction in Volume: 100% Wound Description Classification: Partial Thickness Exudate Amount: None Present Periwound Skin Texture Texture Color No Abnormalities Noted: No No Abnormalities Noted: No Moisture No Abnormalities Noted: No Treatment Notes Wound #1 (Foot) Wound Laterality: Left, Medial Cleanser Soap and Water Discharge Instruction: May shower and wash wound with dial  antibacterial soap and water prior to dressing change. Vashe 5.8 (oz) Discharge Instruction: Cleanse the wound with Vashe prior to applying a clean dressing using gauze sponges, not tissue or cotton balls. Peri-Wound Care Sween Lotion (Moisturizing lotion) Discharge Instruction: Apply moisturizing lotion as directed Topical Primary Dressing Promogran Prisma Matrix, 4.34 (sq in) (silver collagen) Discharge Instruction: Moisten collagen with saline or hydrogel Secondary Dressing Woven Gauze Sponge, Non-Sterile 4x4 in Discharge Instruction: Apply over primary dressing as directed. Secured With Compression Wrap Kerlix Roll 4.5x3.1 (in/yd) Discharge Instruction: Apply Kerlix and Coban compression as directed. Coban Self-Adherent Wrap 4x5 (in/yd) Discharge Instruction: Apply over Kerlix as directed. Compression Stockings MERSADIES, LEAK R ([id number]) [id number] Page 5 of 10 Add-Ons Electronic Signature(s) Signed: 06/14/2023 4:12:01 PM By: Thayer Dallas Entered By: Thayer Dallas on 06/14/2023 12:01:15 -------------------------------------------------------------------------------- Wound Assessment Details Patient Name: Date of Service: [name], [name] 06/14/2023 11:15 A M Medical Record Number: [id number] Patient Account Number:  [id number] Date of Birth/Sex: Treating RN: Jul 06, 1986 (37 y.o. F) Primary Care Dimetrius Montfort: Sanda Linger Other Clinician: Thayer Dallas Referring Anzleigh Slaven: Treating Eon Zunker/Extender: Madelyn Flavors in Treatment: 11 Wound Status Wound Number: 2 Primary Etiology: Lymphedema Wound Location: Left, Distal Foot Wound Status: Open Wounding Event: Gradually Appeared Date Acquired: 03/09/2023 Weeks Of Treatment: 11 Clustered Wound: Yes Wound Measurements Length: (cm) 0.5 Width: (cm) 0.4 Depth: (cm) 0.1 Area: (cm) 0.157 Volume: (cm) 0.016 % Reduction in Area: 99.3% % Reduction in Volume: 99.3% Wound Description Classification: Full Thickness Without Exposed Suppor Exudate Amount: Small Exudate Type: Serosanguineous Exudate Color: red, brown t Structures Periwound Skin Texture Texture Color No Abnormalities Noted: No No Abnormalities Noted: No Moisture No Abnormalities Noted: No Treatment Notes Wound #2 (Foot) Wound Laterality: Left, Distal Cleanser Soap and Water Discharge Instruction: May shower and wash wound with dial antibacterial soap and water prior to dressing change. Vashe 5.8 (oz) Discharge Instruction: Cleanse the wound with Vashe prior to applying a clean dressing using gauze sponges, not tissue or cotton balls. Peri-Wound Care Sween Lotion (Moisturizing lotion) Discharge Instruction: Apply moisturizing lotion as directed Topical Primary Dressing Promogran Prisma Matrix, 4.34 (sq in) (silver collagen) Discharge Instruction: Moisten collagen with saline or hydrogel Secondary Dressing Woven Gauze Sponge, Non-Sterile 4x4 in Discharge Instruction: Apply over primary dressing as directed. MELADEE, COPPINS R ([id number]) [id number] Page 6 of 10 Secured With Compression Wrap Kerlix Roll 4.5x3.1 (in/yd) Discharge Instruction: Apply Kerlix and Coban compression as directed. Coban Self-Adherent Wrap 4x5 (in/yd) Discharge  Instruction: Apply over Kerlix as directed. Compression Stockings Add-Ons Electronic Signature(s) Signed: 06/14/2023 4:12:01 PM By: Thayer Dallas Entered By: Thayer Dallas on 06/14/2023 12:01:15 -------------------------------------------------------------------------------- Wound Assessment Details Patient Name: Date of Service: [name], [name] 06/14/2023 11:15 A M Medical Record Number: [id number] Patient Account Number: [id number] Date of Birth/Sex: Treating RN: Nov 03, 1985 (37 y.o. F) Primary Care Gazelle Towe: Sanda Linger Other Clinician: Thayer Dallas Referring Torrion Witter: Treating Icis Budreau/Extender: Madelyn Flavors in Treatment: 11 Wound Status Wound Number: 3 Primary Etiology: Lymphedema Wound Location: Left, Proximal, Dorsal Foot Wound Status: Open Wounding Event: Gradually Appeared Date Acquired: 03/09/2023 Weeks Of Treatment: 11 Clustered Wound: Yes Wound Measurements Length: (cm) 0.4 Width: (cm) 0.4 Depth: (cm) 0.1 Area: (cm) 0.126 Volume: (cm) 0.013 % Reduction in Area: 99.2% % Reduction in Volume: 99.6% Wound Description Classification: Partial Thickness Exudate Amount: Small Exudate Type: Serosanguineous Exudate Color: red, brown Periwound Skin Texture Texture Color No Abnormalities Noted: No No Abnormalities Noted: No Moisture No Abnormalities Noted: No Treatment Notes  Wound #3 (Foot) Wound Laterality: Dorsal, Left, Proximal Cleanser Soap and Water Discharge Instruction: May shower and wash wound with dial antibacterial soap and water prior to dressing change. Vashe 5.8 (oz) Discharge Instruction: Cleanse the wound with Vashe prior to applying a clean dressing using gauze sponges, not tissue or cotton balls. Peri-Wound Care Sween Lotion (Moisturizing lotion) NUPUR, DEAS R ([id number]) [id number] Page 7 of 10 Discharge Instruction: Apply moisturizing lotion as directed Topical Primary  Dressing Promogran Prisma Matrix, 4.34 (sq in) (silver collagen) Discharge Instruction: Moisten collagen with saline or hydrogel Secondary Dressing Woven Gauze Sponge, Non-Sterile 4x4 in Discharge Instruction: Apply over primary dressing as directed. Secured With Compression Wrap Kerlix Roll 4.5x3.1 (in/yd) Discharge Instruction: Apply Kerlix and Coban compression as directed. Coban Self-Adherent Wrap 4x5 (in/yd) Discharge Instruction: Apply over Kerlix as directed. Compression Stockings Add-Ons Electronic Signature(s) Signed: 06/14/2023 4:12:01 PM By: Thayer Dallas Entered By: Thayer Dallas on 06/14/2023 12:01:15 -------------------------------------------------------------------------------- Wound Assessment Details Patient Name: Date of Service: [name], [name] 06/14/2023 11:15 A M Medical Record Number: [id number] Patient Account Number: [id number] Date of Birth/Sex: Treating RN: 02-23-1986 (37 y.o. F) Primary Care Austin Herd: Sanda Linger Other Clinician: Thayer Dallas Referring Nyjai Graff: Treating Larue Lightner/Extender: Madelyn Flavors in Treatment: 11 Wound Status Wound Number: 4 Primary Etiology: Lymphedema Wound Location: Right, Dorsal Foot Wound Status: Open Wounding Event: Gradually Appeared Date Acquired: 03/09/2023 Weeks Of Treatment: 11 Clustered Wound: Yes Wound Measurements Length: (cm) 1.3 Width: (cm) 0.8 Depth: (cm) 0.1 Area: (cm) 0.817 Volume: (cm) 0.082 % Reduction in Area: 97.8% % Reduction in Volume: 97.8% Wound Description Classification: Full Thickness Without Exposed Suppor Exudate Amount: Medium Exudate Type: Serosanguineous Exudate Color: red, brown t Structures Periwound Skin Texture Texture Color No Abnormalities Noted: No No Abnormalities Noted: No Moisture No Abnormalities Noted: No Treatment Notes TOCARA, MUSSER R ([id number]) [id number] Page 8 of 10 Wound #4 (Foot) Wound  Laterality: Dorsal, Right Cleanser Soap and Water Discharge Instruction: May shower and wash wound with dial antibacterial soap and water prior to dressing change. Vashe 5.8 (oz) Discharge Instruction: Cleanse the wound with Vashe prior to applying a clean dressing using gauze sponges, not tissue or cotton balls. Peri-Wound Care Sween Lotion (Moisturizing lotion) Discharge Instruction: Apply moisturizing lotion as directed Topical Primary Dressing Promogran Prisma Matrix, 4.34 (sq in) (silver collagen) Discharge Instruction: Moisten collagen with saline or hydrogel Secondary Dressing Woven Gauze Sponge, Non-Sterile 4x4 in Discharge Instruction: Apply over primary dressing as directed. Secured With Compression Wrap Kerlix Roll 4.5x3.1 (in/yd) Discharge Instruction: Apply Kerlix and Coban compression as directed. Coban Self-Adherent Wrap 4x5 (in/yd) Discharge Instruction: Apply over Kerlix as directed. Compression Stockings Add-Ons Electronic Signature(s) Signed: 06/14/2023 4:12:01 PM By: Thayer Dallas Entered By: Thayer Dallas on 06/14/2023 12:01:15 -------------------------------------------------------------------------------- Wound Assessment Details Patient Name: Date of Service: [name], [name] 06/14/2023 11:15 A M Medical Record Number: [id number] Patient Account Number: [id number] Date of Birth/Sex: Treating RN: 05-Nov-1985 (37 y.o. F) Primary Care Doreen Garretson: Sanda Linger Other Clinician: Thayer Dallas Referring Jayvin Hurrell: Treating Inocencio Roy/Extender: Madelyn Flavors in Treatment: 11 Wound Status Wound Number: 6 Primary Etiology: Lymphedema Wound Location: Right, Distal, Dorsal Foot Wound Status: Open Wounding Event: Blister Date Acquired: 06/07/2023 Weeks Of Treatment: 1 Clustered Wound: No Wound Measurements Length: (cm) 0.8 Width: (cm) 1 Depth: (cm) 0.1 Area: (cm) 0.628 Volume: (cm) 0.063 % Reduction in Area: 0% % Reduction in  Volume: 0% Wound Description Classification: Full Thickness Without Exposed Support Structures Exudate Amount: Small Exudate Type:  Serosanguineous Olheiser, Brittiany R ([id number]) Exudate Color: red, brown [id number] Page 9 of 10 Periwound Skin Texture Texture Color No Abnormalities Noted: No No Abnormalities Noted: No Moisture No Abnormalities Noted: No Treatment Notes Wound #6 (Foot) Wound Laterality: Dorsal, Right, Distal Cleanser Soap and Water Discharge Instruction: May shower and wash wound with dial antibacterial soap and water prior to dressing change. Vashe 5.8 (oz) Discharge Instruction: Cleanse the wound with Vashe prior to applying a clean dressing using gauze sponges, not tissue or cotton balls. Peri-Wound Care Sween Lotion (Moisturizing lotion) Discharge Instruction: Apply moisturizing lotion as directed Topical Primary Dressing Promogran Prisma Matrix, 4.34 (sq in) (silver collagen) Discharge Instruction: Moisten collagen with saline or hydrogel Secondary Dressing Woven Gauze Sponge, Non-Sterile 4x4 in Discharge Instruction: Apply over primary dressing as directed. Secured With Compression Wrap Kerlix Roll 4.5x3.1 (in/yd) Discharge Instruction: Apply Kerlix and Coban compression as directed. Coban Self-Adherent Wrap 4x5 (in/yd) Discharge Instruction: Apply over Kerlix as directed. Compression Stockings Add-Ons Electronic Signature(s) Signed: 06/14/2023 4:12:01 PM By: Thayer Dallas Entered By: Thayer Dallas on 06/14/2023 12:01:15 -------------------------------------------------------------------------------- Vitals Details Patient Name: Date of Service: [name], [name] 06/14/2023 11:15 A M Medical Record Number: [id number] Patient Account Number: [id number] Date of Birth/Sex: Treating RN: [birth year] (37 y.o. F) Primary Care Kaydie Petsch: Sanda Linger Other Clinician: Referring Aayliah Rotenberry: Treating Bartow Zylstra/Extender: Madelyn Flavors in Treatment: 11 Vital Signs Time Taken: 11:15 Reference Range: 80 - 120 mg / dl Height (in): 69 Weight (lbs): 272 Body Mass Index (BMI): 40.2 Electronic Signature(s) QUIARA, KORZENIEWSKI R ([id number]) [phone].pdf Page 10 of 10 Signed: 06/14/2023 4:12:01 PM By: Thayer Dallas Entered By: Thayer Dallas on 06/14/2023 11:59:46

## 2023-06-22 ENCOUNTER — Encounter (HOSPITAL_BASED_OUTPATIENT_CLINIC_OR_DEPARTMENT_OTHER): Payer: BC Managed Care – PPO | Admitting: Internal Medicine

## 2023-06-22 DIAGNOSIS — L97518 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 severity: Secondary | ICD-10-CM | POA: Diagnosis not present

## 2023-06-22 DIAGNOSIS — I890 Lymphedema, not elsewhere classified: Secondary | ICD-10-CM | POA: Diagnosis not present

## 2023-06-22 DIAGNOSIS — L958 Other vasculitis limited to the skin: Secondary | ICD-10-CM | POA: Diagnosis not present

## 2023-06-22 DIAGNOSIS — L97528 Non-pressure chronic ulcer of other part of left foot with other specified severity: Secondary | ICD-10-CM | POA: Diagnosis not present

## 2023-06-24 NOTE — Progress Notes (Signed)
MARCILLE, WALIGORSKI R ([id number]) [id number] Page 1 of 10 Visit Report for 06/22/2023 Arrival Information Details Patient Name: Date of Service: [name] NDRA R. 06/22/2023 9:45 A M Medical Record Number: [id number] Patient Account Number: [id number] Date of Birth/Sex: Treating RN: Dec 29, 1985 (37 y.o. F) Primary Care Samrat Hayward: Sanda Linger Other Clinician: Referring Sharronda Schweers: Treating Tyianna Menefee/Extender: Madelyn Flavors in Treatment: 13 Visit Information History Since Last Visit Added or deleted any medications: No Patient Arrived: Ambulatory Any new allergies or adverse reactions: No Arrival Time: 10:02 Had a fall or experienced change in No Accompanied By: mother activities of daily living that may affect Transfer Assistance: None risk of falls: Patient Identification Verified: Yes Signs or symptoms of abuse/neglect since last visito No Secondary Verification Process Completed: Yes Hospitalized since last visit: No Patient Requires Transmission-Based Precautions: No Implantable device outside of the clinic excluding No Patient Has Alerts: No cellular tissue based products placed in the center since last visit: Has Dressing in Place as Prescribed: Yes Pain Present Now: No Electronic Signature(s) Signed: 06/23/2023 5:28:18 PM By: Thayer Dallas Entered By: Thayer Dallas on 06/22/2023 10:03:19 -------------------------------------------------------------------------------- Clinic Level of Care Assessment Details Patient Name: Date of Service: [name] NDRA R. 06/22/2023 9:45 A M Medical Record Number: [id number] Patient Account Number: [id number] Date of Birth/Sex: Treating RN: 1986-02-28 (37 y.o. [name] Primary Care Godwin Tedesco: Sanda Linger Other Clinician: Referring Jorene Kaylor: Treating Yomar Mejorado/Extender: Madelyn Flavors in Treatment: 13 Clinic Level of Care Assessment Items TOOL 4 Quantity  Score X- 1 0 Use when only an EandM is performed on FOLLOW-UP visit ASSESSMENTS - Nursing Assessment / Reassessment X- 1 10 Reassessment of Co-morbidities (includes updates in patient status) X- 1 5 Reassessment of Adherence to Treatment Plan ASSESSMENTS - Wound and Skin A ssessment / Reassessment []  - 0 Simple Wound Assessment / Reassessment - one wound X- 6 5 Complex Wound Assessment / Reassessment - multiple wounds []  - 0 Dermatologic / Skin Assessment (not related to wound area) ASSESSMENTS - Focused Assessment []  - 0 Circumferential Edema Measurements - multi extremities []  - 0 Nutritional Assessment / Counseling / Intervention SUPREET, TIM R ([id number]) [id number] Page 2 of 10 []  - 0 Lower Extremity Assessment (monofilament, tuning fork, pulses) []  - 0 Peripheral Arterial Disease Assessment (using hand held doppler) ASSESSMENTS - Ostomy and/or Continence Assessment and Care []  - 0 Incontinence Assessment and Management []  - 0 Ostomy Care Assessment and Management (repouching, etc.) PROCESS - Coordination of Care []  - 0 Simple Patient / Family Education for ongoing care X- 1 20 Complex (extensive) Patient / Family Education for ongoing care X- 1 10 Staff obtains Chiropractor, Records, T Results / Process Orders est []  - 0 Staff telephones HHA, Nursing Homes / Clarify orders / etc []  - 0 Routine Transfer to another Facility (non-emergent condition) []  - 0 Routine Hospital Admission (non-emergent condition) []  - 0 New Admissions / Manufacturing engineer / Ordering NPWT Apligraf, etc. , []  - 0 Emergency Hospital Admission (emergent condition) []  - 0 Simple Discharge Coordination X- 1 15 Complex (extensive) Discharge Coordination PROCESS - Special Needs []  - 0 Pediatric / Minor Patient Management []  - 0 Isolation Patient Management []  - 0 Hearing / Language / Visual special needs []  - 0 Assessment of Community assistance  (transportation, D/C planning, etc.) []  - 0 Additional assistance / Altered mentation []  - 0 Support Surface(s) Assessment (bed, cushion, seat, etc.) INTERVENTIONS - Wound Cleansing / Measurement []  - 0 Simple Wound  Cleansing - one wound X- 6 5 Complex Wound Cleansing - multiple wounds X- 1 5 Wound Imaging (photographs - any number of wounds) []  - 0 Wound Tracing (instead of photographs) []  - 0 Simple Wound Measurement - one wound X- 6 5 Complex Wound Measurement - multiple wounds INTERVENTIONS - Wound Dressings []  - 0 Small Wound Dressing one or multiple wounds []  - 0 Medium Wound Dressing one or multiple wounds []  - 0 Large Wound Dressing one or multiple wounds []  - 0 Application of Medications - topical []  - 0 Application of Medications - injection INTERVENTIONS - Miscellaneous []  - 0 External ear exam []  - 0 Specimen Collection (cultures, biopsies, blood, body fluids, etc.) []  - 0 Specimen(s) / Culture(s) sent or taken to Lab for analysis []  - 0 Patient Transfer (multiple staff / Nurse, adult / Similar devices) []  - 0 Simple Staple / Suture removal (25 or less) []  - 0 Complex Staple / Suture removal (26 or more) []  - 0 Hypo / Hyperglycemic Management (close monitor of Blood Glucose) Bunda, Maudell R ([id number]) [id number] Page 3 of 10 []  - 0 Ankle / Brachial Index (ABI) - do not check if billed separately X- 1 5 Vital Signs Has the patient been seen at the hospital within the last three years: Yes Total Score: 160 Level Of Care: New/Established - Level 5 Electronic Signature(s) Signed: 06/23/2023 5:35:56 PM By: Shawn Stall RN, BSN Entered By: Shawn Stall on 06/22/2023 10:35:37 -------------------------------------------------------------------------------- Encounter Discharge Information Details Patient Name: Date of Service: [name], [name] 06/22/2023 9:45 A M Medical Record Number: [id number] Patient Account  Number: [id number] Date of Birth/Sex: Treating RN: 1986/04/01 (37 y.o. [name] Primary Care Theron Cumbie: Sanda Linger Other Clinician: Referring Sakara Lehtinen: Treating Evia Goldsmith/Extender: Madelyn Flavors in Treatment: 13 Encounter Discharge Information Items Discharge Condition: Stable Ambulatory Status: Ambulatory Discharge Destination: Home Transportation: Private Auto Accompanied By: mother Schedule Follow-up Appointment: No Clinical Summary of Care: Electronic Signature(s) Signed: 06/23/2023 5:35:56 PM By: Shawn Stall RN, BSN Entered By: Shawn Stall on 06/22/2023 10:35:58 -------------------------------------------------------------------------------- Lower Extremity Assessment Details Patient Name: Date of Service: [name], [name] 06/22/2023 9:45 A M Medical Record Number: [id number] Patient Account Number: [id number] Date of Birth/Sex: Treating RN: May 27, 1986 (37 y.o. F) Primary Care Rodneisha Bonnet: Sanda Linger Other Clinician: Referring Seanpatrick Maisano: Treating Alivea Gladson/Extender: Madelyn Flavors in Treatment: 13 Edema Assessment Assessed: Kyra Searles: No] Franne Forts: No] Edema: [Left: Yes] [Right: Yes] Calf Left: Right: Point of Measurement: 39 cm From Medial Instep 48 cm 47.5 cm Ankle Left: Right: Point of Measurement: 9 cm From Medial Instep 25.5 cm 24.5 cm Vascular Assessment Gatling, Markayla R ([id number]) [Right:[id number] Page 4 of 10] Extremity colors, hair growth, and conditions: Extremity Color: [Left:Hyperpigmented] [Right:Hyperpigmented] Hair Growth on Extremity: [Left:No] [Right:No] Temperature of Extremity: [Left:Warm] [Right:Warm] Capillary Refill: [Left:< 3 seconds] [Right:< 3 seconds] Dependent Rubor: [Left:No No] [Right:No No] Electronic Signature(s) Signed: 06/23/2023 5:28:18 PM By: Thayer Dallas Entered By: Thayer Dallas on 06/22/2023  10:12:27 -------------------------------------------------------------------------------- Multi-Disciplinary Care Plan Details Patient Name: Date of Service: [name], [name] 06/22/2023 9:45 A M Medical Record Number: [id number] Patient Account Number: [id number] Date of Birth/Sex: Treating RN: [birth year] (37 y.o. [name], [name] Primary Care Tarance Balan: Sanda Linger Other Clinician: Referring Friedrich Harriott: Treating Rodriquez Thorner/Extender: Madelyn Flavors in Treatment: 13 Multidisciplinary Care Plan reviewed with physician Active Inactive Electronic Signature(s) Signed: 06/23/2023 5:35:56 PM By: Shawn Stall RN, BSN Entered By: Shawn Stall on 06/22/2023 10:31:49 --------------------------------------------------------------------------------  Pain Assessment Details Patient Name: Date of Service: [name] NDRA R. 06/22/2023 9:45 A M Medical Record Number: [id number] Patient Account Number: [id number] Date of Birth/Sex: Treating RN: [birth year] (37 y.o. F) Primary Care Jenean Escandon: Sanda Linger Other Clinician: Referring Paysley Poplar: Treating Mattalynn Crandle/Extender: Madelyn Flavors in Treatment: 13 Active Problems Location of Pain Severity and Description of Pain Patient Has Paino No Site Locations [place], Morgan R ([id number]) [id number] Page 5 of 10 Pain Management and Medication Current Pain Management: Electronic Signature(s) Signed: 06/23/2023 5:28:18 PM By: Thayer Dallas Entered By: Thayer Dallas on 06/22/2023 10:11:42 -------------------------------------------------------------------------------- Patient/Caregiver Education Details Patient Name: Date of Service: [name], [name] 12/17/2024andnbsp9:45 A M Medical Record Number: [id number] Patient Account Number: [id number] Date of Birth/Gender: Treating RN: September 10, 1985 (37 y.o. [name] Primary Care Physician: Sanda Linger Other  Clinician: Referring Physician: Treating Physician/Extender: Madelyn Flavors in Treatment: 13 Education Assessment Education Provided To: Patient Education Topics Provided Electronic Signature(s) Signed: 06/23/2023 5:35:56 PM By: Shawn Stall RN, BSN Entered By: Shawn Stall on 06/22/2023 10:32:03 -------------------------------------------------------------------------------- Wound Assessment Details Patient Name: Date of Service: [name], [name] 06/22/2023 9:45 A M Medical Record Number: [id number] Patient Account Number: [id number] Date of Birth/Sex: Treating RN: Oct 07, 1985 (37 y.o. [name] Primary Care Donice Alperin: Sanda Linger Other Clinician: Referring Ronn Smolinsky: Treating Kyndle Schlender/Extender: Madelyn Flavors in Treatment: 13 Wound Status Wound Number: 1 Primary Etiology: Lymphedema KEVON, ARMAN R ([id number]) [id number] Page 6 of 10 Wound Location: Left, Medial Foot Wound Status: Healed - Epithelialized Wounding Event: Gradually Appeared Comorbid History: Anemia, Sleep Apnea, Vasculitis, Neuropathy Date Acquired: 03/09/2023 Weeks Of Treatment: 13 Clustered Wound: Yes Photos Wound Measurements Length: (cm) Width: (cm) Depth: (cm) Area: (cm) Volume: (cm) 0 % Reduction in Area: 100% 0 % Reduction in Volume: 100% 0 Epithelialization: Large (67-100%) 0 Tunneling: No 0 Undermining: No Wound Description Classification: Partial Thickness Exudate Amount: None Present Foul Odor After Cleansing: No Slough/Fibrino No Periwound Skin Texture Texture Color No Abnormalities Noted: No No Abnormalities Noted: No Moisture No Abnormalities Noted: No Electronic Signature(s) Signed: 06/23/2023 5:35:56 PM By: Shawn Stall RN, BSN Entered By: Shawn Stall on 06/22/2023 10:32:30 -------------------------------------------------------------------------------- Wound Assessment Details Patient  Name: Date of Service: [name], [name] 06/22/2023 9:45 A M Medical Record Number: [id number] Patient Account Number: [id number] Date of Birth/Sex: Treating RN: 1985/07/26 (37 y.o. [name] Primary Care Cheray Pardi: Sanda Linger Other Clinician: Referring Daun Rens: Treating Mendell Bontempo/Extender: Madelyn Flavors in Treatment: 13 Wound Status Wound Number: 2 Primary Etiology: Lymphedema Wound Location: Left, Distal Foot Wound Status: Healed - Epithelialized Wounding Event: Gradually Appeared Comorbid History: Anemia, Sleep Apnea, Vasculitis, Neuropathy Date Acquired: 03/09/2023 Weeks Of Treatment: 13 Clustered Wound: Yes Photos ZAELA, PROVENCHER R ([id number]) [phone].pdf Page 7 of 10 Wound Measurements Length: (cm) Width: (cm) Depth: (cm) Area: (cm) Volume: (cm) 0 % Reduction in Area: 100% 0 % Reduction in Volume: 100% 0 Epithelialization: Large (67-100%) 0 Tunneling: No 0 Undermining: No Wound Description Classification: Partial Thickness Exudate Amount: None Present Foul Odor After Cleansing: No Slough/Fibrino No Periwound Skin Texture Texture Color No Abnormalities Noted: No No Abnormalities Noted: No Moisture Temperature / Pain No Abnormalities Noted: No Temperature: No Abnormality Electronic Signature(s) Signed: 06/23/2023 5:35:56 PM By: Shawn Stall RN, BSN Entered By: Shawn Stall on 06/22/2023 10:32:30 -------------------------------------------------------------------------------- Wound Assessment Details Patient Name: Date of Service: [name], [name] 06/22/2023 9:45 A M Medical Record Number: [id number]  Patient Account Number: [id number] Date of Birth/Sex: Treating RN: [birth year] (37 y.o. [name] Primary Care Evita Merida: Sanda Linger Other Clinician: Referring Klay Sobotka: Treating Bilal Manzer/Extender: Madelyn Flavors in Treatment: 13 Wound Status Wound Number: 3 Primary  Etiology: Lymphedema Wound Location: Left, Proximal, Dorsal Foot Wound Status: Healed - Epithelialized Wounding Event: Gradually Appeared Comorbid History: Anemia, Sleep Apnea, Vasculitis, Neuropathy Date Acquired: 03/09/2023 Weeks Of Treatment: 13 Clustered Wound: Yes Photos LAVAE, ARCIDIACONO R ([id number]) [id number] Page 8 of 10 Wound Measurements Length: (cm) Width: (cm) Depth: (cm) Area: (cm) Volume: (cm) 0 % Reduction in Area: 100% 0 % Reduction in Volume: 100% 0 Epithelialization: Large (67-100%) 0 Tunneling: No 0 Undermining: No Wound Description Classification: Partial Thickness Exudate Amount: None Present Foul Odor After Cleansing: No Slough/Fibrino No Periwound Skin Texture Texture Color No Abnormalities Noted: No No Abnormalities Noted: No Moisture Temperature / Pain No Abnormalities Noted: No Temperature: No Abnormality Electronic Signature(s) Signed: 06/23/2023 5:35:56 PM By: Shawn Stall RN, BSN Entered By: Shawn Stall on 06/22/2023 10:32:31 -------------------------------------------------------------------------------- Wound Assessment Details Patient Name: Date of Service: [name], [name] 06/22/2023 9:45 A M Medical Record Number: [id number] Patient Account Number: [id number] Date of Birth/Sex: Treating RN: 1986-03-16 (37 y.o. [name], Millard.Loa Primary Care Fernand Sorbello: Sanda Linger Other Clinician: Referring Velencia Lenart: Treating Nekayla Heider/Extender: Madelyn Flavors in Treatment: 13 Wound Status Wound Number: 4 Primary Etiology: Lymphedema Wound Location: Right, Dorsal Foot Wound Status: Healed - Epithelialized Wounding Event: Gradually Appeared Comorbid History: Anemia, Sleep Apnea, Vasculitis, Neuropathy Date Acquired: 03/09/2023 Weeks Of Treatment: 13 Clustered Wound: Yes Photos Wound Measurements Length: (cm) Width: (cm) Depth: (cm) Area: (cm) Volume: (cm) 0 % Reduction in Area: 100% 0 %  Reduction in Volume: 100% 0 Epithelialization: Large (67-100%) 0 Tunneling: No 0 Undermining: No Wound Description Classification: Full Thickness Without Exposed Support St Exudate Amount: None Present KIMLEY, BITTLE R ([id number]) ructures Foul Odor After Cleansing: No Slough/Fibrino No [id number] Page 9 of 10 Periwound Skin Texture Texture Color No Abnormalities Noted: No No Abnormalities Noted: No Moisture Temperature / Pain No Abnormalities Noted: No Temperature: No Abnormality Electronic Signature(s) Signed: 06/23/2023 5:35:56 PM By: Shawn Stall RN, BSN Entered By: Shawn Stall on 06/22/2023 10:32:31 -------------------------------------------------------------------------------- Wound Assessment Details Patient Name: Date of Service: [name], [name] 06/22/2023 9:45 A M Medical Record Number: [id number] Patient Account Number: [id number] Date of Birth/Sex: Treating RN: October 06, 1985 (37 y.o. [name], Millard.Loa Primary Care Masiah Lewing: Sanda Linger Other Clinician: Referring Janelly Switalski: Treating Buzz Axel/Extender: Madelyn Flavors in Treatment: 13 Wound Status Wound Number: 6 Primary Etiology: Lymphedema Wound Location: Right, Distal, Dorsal Foot Wound Status: Healed - Epithelialized Wounding Event: Blister Comorbid History: Anemia, Sleep Apnea, Vasculitis, Neuropathy Date Acquired: 06/07/2023 Weeks Of Treatment: 2 Clustered Wound: No Photos Wound Measurements Length: (cm) Width: (cm) Depth: (cm) Area: (cm) Volume: (cm) 0 % Reduction in Area: 100% 0 % Reduction in Volume: 100% 0 Epithelialization: Large (67-100%) 0 Tunneling: No 0 Undermining: No Wound Description Classification: Full Thickness Without Exposed Support Structures Exudate Amount: None Present Foul Odor After Cleansing: No Slough/Fibrino No Periwound Skin Texture Texture Color No Abnormalities Noted: No No Abnormalities Noted: No Moisture  Temperature / Pain No Abnormalities Noted: No Temperature: No Abnormality Electronic Signature(s) Signed: 06/23/2023 5:35:56 PM By: Shawn Stall RN, BSN Entered By: Shawn Stall on 06/22/2023 10:32:31 [name] ([id number]) [id number] Page 10 of 10 -------------------------------------------------------------------------------- Vitals Details Patient Name: Date of Service: [name], [name] 06/22/2023 9:45  A M Medical Record Number: [id number] Patient Account Number: [id number] Date of Birth/Sex: Treating RN: [birth year] (37 y.o. F) Primary Care Orvilla Truett: Sanda Linger Other Clinician: Referring Danalee Flath: Treating Avarey Yaeger/Extender: Madelyn Flavors in Treatment: 13 Vital Signs Time Taken: 10:11 Temperature (F): 98.9 Height (in): 69 Pulse (bpm): 74 Weight (lbs): 272 Respiratory Rate (breaths/min): 18 Body Mass Index (BMI): 40.2 Blood Pressure (mmHg): 141/91 Reference Range: 80 - 120 mg / dl Electronic Signature(s) Signed: 06/23/2023 5:28:18 PM By: Thayer Dallas Entered By: Thayer Dallas on 06/22/2023 10:23:15

## 2023-06-24 NOTE — Progress Notes (Signed)
[name], PRUSHA ([id number]) [id number] Page 1 of 6 Visit Report for 06/22/2023 HPI Details Patient Name: Date of Service: GA MMA [name] NDRA R. 06/22/2023 9:45 A M Medical Record Number: [id number] Patient Account Number: [id number] Date of Birth/Sex: Treating RN: 1985-10-23 (37 y.o. F) Primary Care Provider: Sanda Linger Other Clinician: Referring Provider: Treating Provider/Extender: Madelyn Flavors in Treatment: 13 History of Present Illness HPI Description: ADMISSION 03/23/2023 This is a 37 year old woman who is Story is somewhat difficult to follow however she apparently has developed bilateral lower extremity swelling involving her feet and ankles roughly 3 weeks ago. This but this became associated with small painful areas on predominantly her left greater than right foot with exfoliation of the skin. She was admitted to hospital from 03/16/2023 through 03/19/2023. X-ray of the feet was negative however she was noted to have a sedimentation rate greater than 90. Although she was empirically given antibiotics at the start of the admission. She was seen by infectious disease who did not feel this was infectious. It was felt that she required a skin biopsy for possible vasculitis. Dermatology and rheumatology consults were suggested. She was discharged from the hospital on a gradually tapering dose of prednisone. She had cryoglobulins, complement levels measured. She did not have an active urinalysis. She has been soaking her feet in ice water and cold towels to relieve the pain. She saw her dermatologist who is in [place] yesterday for consideration of a skin biopsy. Per the patient's description the dermatologist did not wish to do a skin biopsy stating that he did not want to create another wound area although I do not have his or her notes at this point and I do not know what diagnosis she was felt to have. She arrives in clinic today with  discomfort in her feet. Exfoliating skin on the dorsal surfaces of both feet punched-out areas with necrosis especially on the left dorsal foot left lateral ankle. There is also areas on the right heel. Past medical history includes acute hepatitis question involving alcohol or hepatitis A, bilateral leg edema, obesity, hypertension, history of psoriasis on Taltz, chronic pain, apparently some form of idiopathic peripheral neuropathy, vitamin B12 deficiency ABIs in our clinic were 1.03 on the right 1.25 on the left Addendum 03/25/2023. I received the records from Dr. Cheree Ditto at [facility] dermatology in [place]. Looking at her record she felt that the issue was secondary to lymphedema and stasis dermatitis on both legs. I am not sure whether she looked at the areas on her feet which was the real area of concern. She specifically stated that she did not think that this was related to her psoriasis. Compression stockings were suggested. No biopsy was done 9/24; as noted above I did receive records from Dr. Cheree Ditto at [facility] dermatology and [place]. She felt the skin changes on her feet were related to lymphedema and stasis dermatitis. The patient arrived back in clinic the skin on her dorsal feet looks a lot better and she is in a lot less pain. HOWEVER she does have multiple small punched- out areas covered in black eschar. I did not biopsy this when I saw her the first time as she was going to dermatology the next day in retrospect that may not have been the right decision. 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 9/30; patient arrives in clinic with no complaints  of pain at all. This is quite an improvement from the first time I saw her. T the punched out holes on her dorsal o feet and right ankle we have been using Santyl. The eschar is loosening to the point where it might be a reasonably easy debridement. We  have also been using TCA and the skin on her feet looks a lot better. She is still on prednisone 40 mg. This was started tapering 60 mg 10 mg weekly in the hospital for vasculitis. Unfortunately no tissue biopsy was done. 10/8; again no pain. The skin in her bilateral dorsal feet looks better than when she first came into the clinic. We have been wrapping her there is no real edema. She comes into the clinic with the wraps falling all the way down however I am not sure what the issue here is. As it turns out nobody is following the prednisone dosage. She went to her primary doctor he did not really address this she is currently on 30 mg of prednisone. As we do not have a diagnosis here I am going to taper the prednisone a little more aggressively and follow the condition of her legs. If she develops other lesions then we will have to think about this but right now we are keeping her on high doses of prednisone without a diagnosis I simply do not agree with this. 10/15; I have tapered her down to 10 mg of prednisone I put in for 5 mg tablets enough for 5 days. Counseled her about steroid withdrawal also the implications for rapid deterioration in her underlying status. As I mentioned previously no concrete diagnosis is available. She did not receive a biopsy. 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 She arrived in clinic today with multiple areas on her bilateral dorsal feet smaller or healed but still some areas that have a necrotic surface although the covering eschar is certainly less adherent that when I first saw her 10/22; starting the 5 mg of prednisone today. Still using Santyl, Hydrofera Blue on the open areas on her feet and ankles. We are still putting her in compression. Liberal use of triamcinolone under the compression The wounds are a lot better today many of them are healed. No new wound areas 10/28; will be finished her prednisone by the middle part of this  week. Still using Santyl, Hydrofera Blue and TCA under compression to her bilateral feet. There are no open wounds. Almost all of her open areas have a clean surface except for 1 area on the right dorsal foot. She did not wish to be debrided this week rather she stated she would put that off till next week 11/4; everything continues to be a lot better. Several of the punched-out wounds have healed including the area on the right lateral calcaneus. We continue with Hydrofera Blue Santyl TCA widely under compression. THULA, KURCZEWSKI ([id number]) [id number] Page 2 of 6 She is not having any ill effects of stopping her prednisone she feels well. 11/11; the patient still has small open areas on the right dorsal foot, left medial foot left dorsal foot. No new wounds. Some of these wounds from last time if healed. No ill effects of stopping her prednisone. Using [facility] and Santyl 11/18; 2 wounds on the right dorsal foot and 1 on the left dorsal foot closely proximal to the third and fourth toes. All of these wounds much healthier in terms of granulation no debridement is required  everything else is either closed or very close to it. There has been no new wounds no systemic symptoms 12/2; 2 wounds on the dorsal foot are smaller. She has 1 in the left dorsal foot and an eschared area on the left medial foot. She has not had any particular issues no new lesions are seen. She tells me she still has some pain in her feet that she relieves by walking around. 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 She came here with a multitude of small punched-out wounds with extreme surface exfoliation, tremendous pain. She was admitted to hospital from 03/16/2023 through 03/19/2023. She had a sedimentation rate greater than 90. She was started on high dose of the  prednisone and suggesting a gradual taper. This was never biopsied. Her dermatologist is in [place]. Essentially I tapered her steroids and use topical steroids. All the wounds eventually closed over and the inflammation receded. When I first saw this I thought this was a vasculitic looking situation but this diagnosis was never proven. Everything is closed now. She is not having any pain Electronic Signature(s) Signed: 06/22/2023 4:32:13 PM By: Baltazar Najjar MD Entered By: Baltazar Najjar on 06/22/2023 10:48:35 -------------------------------------------------------------------------------- Physical Exam Details Patient Name: Date of Service: [name], [name] 06/22/2023 9:45 A M Medical Record Number: [id number] Patient Account Number: [id number] Date of Birth/Sex: Treating RN: 1986/04/06 (37 y.o. F) Primary Care Provider: Sanda Linger Other Clinician: Referring Provider: Treating Provider/Extender: Madelyn Flavors in Treatment: 13 Constitutional Patient is hypertensive.. Pulse regular and within target range for patient.Marland Kitchen Respirations regular, non-labored and within target range.. Temperature is normal and within the target range for the patient.Marland Kitchen Appears in no distress. Notes Wound exam; multiple scars from the punched-out areas on her bilateral feet mostly dorsally but also medially especially in the right heel. Nothing is open at present. There is no swelling no local or systemic skin reaction is obvious Electronic Signature(s) Signed: 06/22/2023 4:32:13 PM By: Baltazar Najjar MD Entered By: Baltazar Najjar on 06/22/2023 10:51:03 -------------------------------------------------------------------------------- Physician Orders Details Patient Name: Date of Service: [name], [name] 06/22/2023 9:45 A M Medical Record Number: [id number] Patient Account Number: [id number] Date of Birth/Sex: Treating RN: [birth year] (37 y.o. 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 closely monitor feet daily. lotion both feet and legs every night before bed. May return to work full time 07/09/2023 with no restrictions. Electronic Signature(s) Signed: 06/22/2023 4:32:13 PM By: Baltazar Najjar MD Signed: 06/23/2023 5:35:56 PM By: Shawn Stall RN, BSN Entered By: Shawn Stall on 06/22/2023 10:35:05 -------------------------------------------------------------------------------- Problem List Details Patient Name: Date of Service: [name], [name] 06/22/2023 9:45 A M Medical Record Number: [id number] Patient Account Number: [id number] Date of Birth/Sex: Treating  RN: Sep 17, 1985 (37 y.o. 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 06/22/2023 9:45 A M Medical Record Number: [id number] Patient Account Number: [id number] Date of Birth/Sex: Treating RN: [birth year] (37 y.o. F) Primary Care Provider: Sanda Linger Other Clinician: [name] ([id number]) [id number] Page 4 of 6 Referring Provider: Treating Provider/Extender: Madelyn Flavors in Treatment: 13 Subjective History of Present Illness (HPI) ADMISSION 03/23/2023 This is a 37 year old woman who is Story is somewhat difficult to follow however she apparently has developed bilateral lower extremity swelling involving her feet and ankles roughly 3 weeks ago. This but this became associated with small painful areas on predominantly her left greater than right foot with exfoliation of the skin. She was admitted to hospital from 03/16/2023 through 03/19/2023. X-ray of the feet was negative however she was noted to have a sedimentation rate greater than 90. Although she was empirically given antibiotics at the start of the admission. She was seen by infectious disease who did not feel this was infectious. It was felt that she required a skin biopsy for possible vasculitis. Dermatology and rheumatology consults were suggested. She was discharged from the hospital on a gradually tapering dose of prednisone. She had cryoglobulins, complement levels measured. She did not have an active urinalysis. She has been soaking her feet in ice water and cold towels to relieve the pain. She saw her dermatologist who is in [place] yesterday for consideration of a skin biopsy. Per the patient's description the dermatologist did not wish to do a skin  biopsy stating that he did not want to create another wound area although I do not have his or her notes at this point and I do not know what diagnosis she was felt to have. She arrives in clinic today with discomfort in her feet. Exfoliating skin on the dorsal surfaces of both feet punched-out areas with necrosis especially on the left dorsal foot left lateral ankle. There is also areas on the right heel. Past medical history includes acute hepatitis question involving alcohol or hepatitis A, bilateral leg edema, obesity, hypertension, history of psoriasis on Taltz, chronic pain, apparently some form of idiopathic peripheral neuropathy, vitamin B12 deficiency ABIs in our clinic were 1.03 on the right 1.25 on the left Addendum 03/25/2023. I received the records from Dr. Cheree Ditto at [facility] dermatology in [place]. Looking at her record she felt that the issue was secondary to lymphedema and stasis dermatitis on both legs. I am not sure whether she looked at the areas on her feet which was the real area of concern. She specifically stated that she did not think that this was related to her psoriasis. Compression stockings were suggested. No biopsy was done 9/24; as noted above I did receive records from Dr. Cheree Ditto at [facility] dermatology and [place]. She felt the skin changes on her feet were related to lymphedema and stasis dermatitis. The patient  arrived back in clinic the skin on her dorsal feet looks a lot better and she is in a lot less pain. HOWEVER she does have multiple small punched- out areas covered in black eschar. I did not biopsy this when I saw her the first time as she was going to dermatology the next day in retrospect that may not have been the right decision. 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 9/30; patient arrives in clinic with no complaints of pain at all. This is quite an improvement  from the first time I saw her. T the punched out holes on her dorsal o feet and right ankle we have been using Santyl. The eschar is loosening to the point where it might be a reasonably easy debridement. We have also been using TCA and the skin on her feet looks a lot better. She is still on prednisone 40 mg. This was started tapering 60 mg 10 mg weekly in the hospital for vasculitis. Unfortunately no tissue biopsy was done. 10/8; again no pain. The skin in her bilateral dorsal feet looks better than when she first came into the clinic. We have been wrapping her there is no real edema. She comes into the clinic with the wraps falling all the way down however I am not sure what the issue here is. As it turns out nobody is following the prednisone dosage. She went to her primary doctor he did not really address this she is currently on 30 mg of prednisone. As we do not have a diagnosis here I am going to taper the prednisone a little more aggressively and follow the condition of her legs. If she develops other lesions then we will have to think about this but right now we are keeping her on high doses of prednisone without a diagnosis I simply do not agree with this. 10/15; I have tapered her down to 10 mg of prednisone I put in for 5 mg tablets enough for 5 days. Counseled her about steroid withdrawal also the implications for rapid deterioration in her underlying status. As I mentioned previously no concrete diagnosis is available. She did not receive a biopsy. 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 She arrived in clinic today with multiple areas on her bilateral dorsal feet smaller or healed but still some areas that have a necrotic surface although the covering eschar is certainly less adherent that when I first saw her 10/22; starting the 5 mg of prednisone today. Still using Santyl, Hydrofera Blue on the open areas on her feet and ankles. We are still putting her  in compression. Liberal use of triamcinolone under the compression The wounds are a lot better today many of them are healed. No new wound areas 10/28; will be finished her prednisone by the middle part of this week. Still using Santyl, Hydrofera Blue and TCA under compression to her bilateral feet. There are no open wounds. Almost all of her open areas have a clean surface except for 1 area on the right dorsal foot. She did not wish to be debrided this week rather she stated she would put that off till next week 11/4; everything continues to be a lot better. Several of the punched-out wounds have healed including the area on the right lateral calcaneus. We continue with Hydrofera Blue Santyl TCA widely under compression. She is not  having any ill effects of stopping her prednisone she feels well. 11/11; the patient still has small open areas on the right dorsal foot, left medial foot left dorsal foot. No new wounds. Some of these wounds from last time if healed. No ill effects of stopping her prednisone. Using [facility] and Santyl 11/18; 2 wounds on the right dorsal foot and 1 on the left dorsal foot closely proximal to the third and fourth toes. All of these wounds much healthier in terms of granulation no debridement is required everything else is either closed or very close to it. There has been no new wounds no systemic symptoms 12/2; 2 wounds on the dorsal foot are smaller. She has 1 in the left dorsal foot and an eschared area on the left medial foot. She has not had any particular issues no new lesions are seen. She tells me she still has some pain in her feet that she relieves by walking around. 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 She came here with a multitude of small punched-out wounds with extreme surface exfoliation, tremendous pain. She was admitted  to hospital from 03/16/2023 through 03/19/2023. She had a sedimentation rate greater than 90. She was started on high dose of the prednisone and suggesting a gradual taper. This was never biopsied. Her dermatologist is in [place]. Essentially I tapered her steroids and use topical steroids. All the wounds eventually closed over and the inflammation receded. When I first saw this I thought this was a vasculitic looking situation but this diagnosis was never proven. Everything is closed now. She is not having any pain ADAYSIA, FAIDLEY R ([id number]) [id number] Page 5 of 6 Objective Constitutional Patient is hypertensive.. Pulse regular and within target range for patient.Marland Kitchen Respirations regular, non-labored and within target range.. Temperature is normal and within the target range for the patient.Marland Kitchen Appears in no distress. Vitals Time Taken: 10:11 AM, Height: 69 in, Weight: 272 lbs, BMI: 40.2, Temperature: 98.9 F, Pulse: 74 bpm, Respiratory Rate: 18 breaths/min, Blood Pressure: 141/91 mmHg. General Notes: Wound exam; multiple scars from the punched-out areas on her bilateral feet mostly dorsally but also medially especially in the right heel. Nothing is open at present. There is no swelling no local or systemic skin reaction is obvious Integumentary (Hair, Skin) Wound #1 status is Healed - Epithelialized. Original cause of wound was Gradually Appeared. The date acquired was: 03/09/2023. The wound has been in treatment 13 weeks. The wound is located on the Left,Medial Foot. The wound measures 0cm length x 0cm width x 0cm depth; 0cm^2 area and 0cm^3 volume. There is no tunneling or undermining noted. There is a none present amount of drainage noted. Wound #2 status is Healed - Epithelialized. Original cause of wound was Gradually Appeared. The date acquired was: 03/09/2023. The wound has been in treatment 13 weeks. The wound is located on the Left,Distal Foot. The wound measures 0cm  length x 0cm width x 0cm depth; 0cm^2 area and 0cm^3 volume. There is no tunneling or undermining noted. There is a none present amount of drainage noted. Periwound temperature was noted as No Abnormality. Wound #3 status is Healed - Epithelialized. Original cause of wound was Gradually Appeared. The date acquired was: 03/09/2023. The wound has been in treatment 13 weeks. The wound is located on the Left,Proximal,Dorsal Foot. The wound measures 0cm length  x 0cm width x 0cm depth; 0cm^2 area and 0cm^3 volume. There is no tunneling or undermining noted. There is a none present amount of drainage noted. Periwound temperature was noted as No Abnormality. Wound #4 status is Healed - Epithelialized. Original cause of wound was Gradually Appeared. The date acquired was: 03/09/2023. The wound has been in treatment 13 weeks. The wound is located on the Right,Dorsal Foot. The wound measures 0cm length x 0cm width x 0cm depth; 0cm^2 area and 0cm^3 volume. There is no tunneling or undermining noted. There is a none present amount of drainage noted. Periwound temperature was noted as No Abnormality. Wound #6 status is Healed - Epithelialized. Original cause of wound was Blister. The date acquired was: 06/07/2023. The wound has been in treatment 2 weeks. The wound is located on the Right,Distal,Dorsal Foot. The wound measures 0cm length x 0cm width x 0cm depth; 0cm^2 area and 0cm^3 volume. There is no tunneling or undermining noted. There is a none present amount of drainage noted. Periwound temperature was noted as No Abnormality. 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 closely monitor feet daily. lotion both feet  and legs every night before bed. May return to work full time 07/09/2023 with no restrictions. 1. The patient can be discharged from the clinic 2. The patient has psoriasis and has a dermatologist in [facility]. The areas on her feet were not psoriasis but she does have access to a dermatologist if this reoccurs. I think she would need a biopsy. 3. I do not think she has any specific secondary prevention we could offer. As noted this was not really a wound issue Electronic Signature(s) Signed: 06/22/2023 4:32:13 PM By: Baltazar Najjar MD Entered By: Baltazar Najjar on 06/22/2023 10:53:18 Hillis Range, Lenord Carbo ([id number]) [id number] Page 6 of 6 -------------------------------------------------------------------------------- SuperBill Details Patient Name: Date of Service: [place] MMA [name] NDRA R. 06/22/2023 Medical Record Number: [id number] Patient Account Number: [id number] Date of Birth/Sex: Treating RN: 11-09-1985 (37 y.o. 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 Facility Procedures : CPT4 Code: [id number] Description: 99215 - WOUND CARE VISIT-LEV 5 EST PT Modifier: Quantity: 1 Physician Procedures : CPT4 Code Description Modifier [id number] - WC PHYS LEVEL 2 - EST PT ICD-10 Diagnosis Description L97.518 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 severity L97.528 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

## 2023-06-25 ENCOUNTER — Other Ambulatory Visit: Payer: Self-pay | Admitting: Internal Medicine

## 2023-06-25 DIAGNOSIS — E876 Hypokalemia: Secondary | ICD-10-CM

## 2023-07-05 ENCOUNTER — Ambulatory Visit: Payer: BC Managed Care – PPO | Admitting: Internal Medicine

## 2023-07-05 ENCOUNTER — Encounter: Payer: Self-pay | Admitting: Internal Medicine

## 2023-07-05 VITALS — BP 138/86 | HR 80 | Temp 98.3°F | Resp 16 | Ht 69.0 in | Wt 303.8 lb

## 2023-07-05 DIAGNOSIS — R200 Anesthesia of skin: Secondary | ICD-10-CM | POA: Insufficient documentation

## 2023-07-05 DIAGNOSIS — Z124 Encounter for screening for malignant neoplasm of cervix: Secondary | ICD-10-CM

## 2023-07-05 DIAGNOSIS — D520 Dietary folate deficiency anemia: Secondary | ICD-10-CM | POA: Diagnosis not present

## 2023-07-05 DIAGNOSIS — J4489 Other specified chronic obstructive pulmonary disease: Secondary | ICD-10-CM

## 2023-07-05 DIAGNOSIS — E876 Hypokalemia: Secondary | ICD-10-CM

## 2023-07-05 DIAGNOSIS — I10 Essential (primary) hypertension: Secondary | ICD-10-CM

## 2023-07-05 DIAGNOSIS — E538 Deficiency of other specified B group vitamins: Secondary | ICD-10-CM

## 2023-07-05 DIAGNOSIS — R202 Paresthesia of skin: Secondary | ICD-10-CM

## 2023-07-05 DIAGNOSIS — E60 Dietary zinc deficiency: Secondary | ICD-10-CM

## 2023-07-05 DIAGNOSIS — Z0001 Encounter for general adult medical examination with abnormal findings: Secondary | ICD-10-CM | POA: Diagnosis not present

## 2023-07-05 DIAGNOSIS — Z9884 Bariatric surgery status: Secondary | ICD-10-CM

## 2023-07-05 LAB — CBC WITH DIFFERENTIAL/PLATELET
Basophils Absolute: 0 10*3/uL (ref 0.0–0.1)
Basophils Relative: 0.7 % (ref 0.0–3.0)
Eosinophils Absolute: 0.1 10*3/uL (ref 0.0–0.7)
Eosinophils Relative: 1.2 % (ref 0.0–5.0)
HCT: 33.1 % — ABNORMAL LOW (ref 36.0–46.0)
Hemoglobin: 10 g/dL — ABNORMAL LOW (ref 12.0–15.0)
Lymphocytes Relative: 18.1 % (ref 12.0–46.0)
Lymphs Abs: 0.9 10*3/uL (ref 0.7–4.0)
MCHC: 30.4 g/dL (ref 30.0–36.0)
MCV: 79.6 fL (ref 78.0–100.0)
Monocytes Absolute: 0.5 10*3/uL (ref 0.1–1.0)
Monocytes Relative: 10.8 % (ref 3.0–12.0)
Neutro Abs: 3.4 10*3/uL (ref 1.4–7.7)
Neutrophils Relative %: 69.2 % (ref 43.0–77.0)
Platelets: 340 10*3/uL (ref 150.0–400.0)
RBC: 4.16 Mil/uL (ref 3.87–5.11)
RDW: 28.1 % — ABNORMAL HIGH (ref 11.5–15.5)
WBC: 5 10*3/uL (ref 4.0–10.5)

## 2023-07-05 LAB — BASIC METABOLIC PANEL
BUN: 12 mg/dL (ref 6–23)
CO2: 24 meq/L (ref 19–32)
Calcium: 9.5 mg/dL (ref 8.4–10.5)
Chloride: 101 meq/L (ref 96–112)
Creatinine, Ser: 0.58 mg/dL (ref 0.40–1.20)
GFR: 115.15 mL/min (ref >60.00)
Glucose, Bld: 77 mg/dL (ref 70–99)
Potassium: 3.8 meq/L (ref 3.5–5.1)
Sodium: 138 meq/L (ref 135–145)

## 2023-07-05 LAB — VITAMIN B12: Vitamin B-12: 84 pg/mL — ABNORMAL LOW (ref 211–911)

## 2023-07-05 LAB — MAGNESIUM: Magnesium: 1.9 mg/dL (ref 1.5–2.5)

## 2023-07-05 MED ORDER — SPIRONOLACTONE 50 MG PO TABS: 50.0000 mg | ORAL_TABLET | Freq: Every day | ORAL | 0 refills | Status: DC

## 2023-07-05 MED ORDER — FOLIC ACID 1 MG PO TABS: 1.0000 mg | ORAL_TABLET | Freq: Every day | ORAL | 0 refills | Status: DC

## 2023-07-05 MED ORDER — LEVALBUTEROL TARTRATE 45 MCG/ACT IN AERO: INHALATION_SPRAY | RESPIRATORY_TRACT | 3 refills | Status: DC

## 2023-07-05 MED ORDER — NEBIVOLOL HCL 10 MG PO TABS
10.0000 mg | ORAL_TABLET | Freq: Every day | ORAL | 0 refills | Status: DC
Start: 2023-07-05 — End: 2023-08-09

## 2023-07-05 MED ORDER — ZINC GLUCONATE 50 MG PO TABS: 50.0000 mg | ORAL_TABLET | Freq: Every day | ORAL | 1 refills | Status: DC

## 2023-07-05 NOTE — Patient Instructions (Signed)

## 2023-07-05 NOTE — Progress Notes (Unsigned)
Subjective:  Patient ID: [name], female    DOB: April 03, 1986  Age: 37 y.o. MRN: [id number]  CC: Annual Exam and Hypertension   HPI Latecia Ippolito presents for a CPX and f/up ----  Discussed the use of AI scribe software for clinical note transcription with the patient, who gave verbal consent to proceed.  History of Present Illness   The patient, with a history of hypertension, asthma, and hepatitis, presents with concerns about recent episodes of high blood pressure. She reports that during her last few visits to the wound care clinic, her blood pressure was elevated, although it was measured as 138/86 during the current visit. She denies experiencing any symptoms typically associated with hypertension, such as headaches or blurred vision, but does note a possible increase in jitteriness.  In addition to hypertension, the patient has been experiencing wheezing and SOB, necessitating more frequent use of her albuterol inhaler. The wheezing occurs even at rest. The patient attributes this to residual effects from a previous bout of pneumonia and COVID-19.  The patient also reports numbness in her feet, which she believes may be related to her history of hepatitis. She expresses interest in seeking consultation with a specialist for neuropathy. Her hepatitis symptoms have otherwise improved, with her feet largely healed.       Outpatient Medications Prior to Visit  Medication Sig Dispense Refill   DULoxetine (CYMBALTA) 20 MG capsule Take 20 mg by mouth daily.     Ferric Maltol (ACCRUFER) 30 MG CAPS Take 1 capsule (30 mg total) by mouth in the morning and at bedtime. 180 capsule 1   folic acid (FOLVITE) 1 MG tablet Take 1 tablet (1 mg total) by mouth daily. 90 tablet 1   HYDROmorphone (DILAUDID) 2 MG tablet Take 1 tablet (2 mg total) by mouth every 6 (six) hours as needed for severe pain. 100 tablet 0   Ixekizumab (TALTZ) 80 MG/ML SOSY Inject 80 mg into the skin every 30  (thirty) days.     nebivolol (BYSTOLIC) 10 MG tablet Take 1 tablet (10 mg total) by mouth daily. 90 tablet 0   potassium chloride (KLOR-CON M10) 10 MEQ tablet Take 1 tablet (10 mEq total) by mouth 2 (two) times daily. 180 tablet 0   prazosin (MINIPRESS) 1 MG capsule Take 1 mg by mouth daily.     Vitamin D, Ergocalciferol, (DRISDOL) 1.25 MG (50000 UNIT) CAPS capsule Take 1 capsule (50,000 Units total) by mouth every 7 (seven) days. 12 capsule 0   levalbuterol (XOPENEX HFA) 45 MCG/ACT inhaler INHALE 2 PUFFS INTO THE LUNGS EVERY 6 HOURS AS NEEDED FOR WHEEZE 15 each 3   spironolactone (ALDACTONE) 25 MG tablet TAKE 1 TABLET (25 MG TOTAL) BY MOUTH DAILY. 90 tablet 0   zinc gluconate 50 MG tablet Take 1 tablet (50 mg total) by mouth daily. 90 tablet 1   doxepin (SINEQUAN) 50 MG capsule Take 50 mg by mouth at bedtime as needed (sleep).     No facility-administered medications prior to visit.    ROS Review of Systems  Objective:  BP 138/86 (BP Location: Left Arm, Patient Position: Sitting, Cuff Size: Large)   Pulse 80   Temp 98.3 F (36.8 C) (Oral)   Resp 16   Ht 5\' 9"  (1.753 m)   Wt (!) 303 lb 12.8 oz (137.8 kg)   LMP 06/29/2023   SpO2 94% Comment: nails too long unable to get  BMI 44.86 kg/m   BP Readings from Last 3  Encounters:  07/05/23 138/86  03/30/23 118/76  03/19/23 (!) 129/97    Wt Readings from Last 3 Encounters:  07/05/23 (!) 303 lb 12.8 oz (137.8 kg)  03/30/23 287 lb (130.2 kg)  03/16/23 276 lb 0.3 oz (125.2 kg)    Physical Exam  Lab Results  Component Value Date   WBC 11.3 (H) 03/18/2023   HGB 12.0 03/18/2023   HCT 39.3 03/18/2023   PLT 328 03/18/2023   GLUCOSE 94 03/30/2023   CHOL 193 02/11/2022   TRIG 134.0 02/11/2022   HDL 82.60 02/11/2022   LDLDIRECT 87.0 10/17/2020   LDLCALC 84 02/11/2022   ALT 16 03/30/2023   AST 18 03/30/2023   NA 140 03/30/2023   K 2.6 (LL) 03/30/2023   CL 96 03/30/2023   CREATININE 0.63 03/30/2023   BUN 11 03/30/2023   CO2 33  (H) 03/30/2023   TSH 2.14 02/02/2023   INR 1.0 03/16/2023   HGBA1C 4.5 (L) 02/02/2023       MR Abdomen W Wo Contrast Result Date: 03/24/2023 CLINICAL DATA:  Elevated liver function tests and alkaline phosphatase level. Morbid obesity. EXAM: MRI ABDOMEN WITHOUT AND WITH CONTRAST TECHNIQUE: Multiplanar multisequence MR imaging of the abdomen was performed both before and after the administration of intravenous contrast. CONTRAST:  10mL GADAVIST GADOBUTROL 1 MMOL/ML IV SOLN COMPARISON:  08/30/2022 FINDINGS: Lower chest: No acute findings. Hepatobiliary: Tiny sub-centimeter benign hemangiomas are seen in the anterior and posterior right hepatic lobe. Significant decrease in hepatic steatosis since prior study. Gallstones are seen, however there is no evidence of cholecystitis or biliary dilatation. Pancreas:  No mass or inflammatory changes. Spleen:  Within normal limits in size and appearance. Adrenals/Urinary Tract: No suspicious masses identified. No evidence of hydronephrosis. Stomach/Bowel: Unremarkable. Vascular/Lymphatic: No pathologically enlarged lymph nodes identified. No acute vascular findings. Other:  None. Musculoskeletal:  No suspicious bone lesions identified. IMPRESSION: Significant decrease in hepatic steatosis since prior study. Tiny benign hepatic hemangiomas. Cholelithiasis. No radiographic evidence of cholecystitis or biliary dilatation. Electronically Signed   By: Danae Orleans M.D.   On: 03/24/2023 12:40   DG Foot Complete Left Result Date: 03/16/2023 CLINICAL DATA:  Bilateral foot swelling EXAM: LEFT FOOT - COMPLETE 3+ VIEW COMPARISON:  None Available. FINDINGS: There is no evidence of fracture or dislocation. There is no evidence of arthropathy or other focal bone abnormality. S soft tissue swelling without emphysema. IMPRESSION: No acute osseous abnormality Electronically Signed   By: Jasmine Pang M.D.   On: 03/16/2023 15:27   DG Foot Complete Right Result Date:  03/16/2023 CLINICAL DATA:  Bilateral foot swelling for 1 month neuropathy open blisters EXAM: RIGHT FOOT COMPLETE - 3+ VIEW COMPARISON:  None Available. FINDINGS: No fracture or malalignment. No periostitis or osseous destructive change. Small plantar calcaneal spur IMPRESSION: No acute osseous abnormality. Electronically Signed   By: Jasmine Pang M.D.   On: 03/16/2023 15:20    Assessment & Plan:  Numbness and tingling of both lower extremities -     Ambulatory referral to Neurology  Asthmatic bronchitis , chronic (HCC) -     Levalbuterol Tartrate; INHALE 2 PUFFS INTO THE LUNGS EVERY 6 HOURS AS NEEDED FOR WHEEZE  Dispense: 15 each; Refill: 3  Chronic zinc deficiency -     Zinc Gluconate; Take 1 tablet (50 mg total) by mouth daily.  Dispense: 90 tablet; Refill: 1 -     CBC with Differential/Platelet; Future  Encounter for general adult medical examination with abnormal findings  Hypokalemia -  Spironolactone; Take 1 tablet (50 mg total) by mouth daily.  Dispense: 90 tablet; Refill: 0 -     Basic metabolic panel; Future -     Magnesium; Future -     AMB Referral [facility]BCI Care Management  Primary hypertension -     Spironolactone; Take 1 tablet (50 mg total) by mouth daily.  Dispense: 90 tablet; Refill: 0 -     Basic metabolic panel; Future -     Magnesium; Future -     AMB Referral [facility]BCI Care Management     Follow-up: No follow-ups on file.  Sanda Linger, MD

## 2023-07-08 ENCOUNTER — Telehealth: Payer: Self-pay

## 2023-07-08 ENCOUNTER — Other Ambulatory Visit: Payer: Self-pay | Admitting: Internal Medicine

## 2023-07-08 ENCOUNTER — Ambulatory Visit (INDEPENDENT_AMBULATORY_CARE_PROVIDER_SITE_OTHER): Payer: BC Managed Care – PPO

## 2023-07-08 DIAGNOSIS — F322 Major depressive disorder, single episode, severe without psychotic features: Secondary | ICD-10-CM

## 2023-07-08 DIAGNOSIS — F5104 Psychophysiologic insomnia: Secondary | ICD-10-CM | POA: Insufficient documentation

## 2023-07-08 DIAGNOSIS — E538 Deficiency of other specified B group vitamins: Secondary | ICD-10-CM

## 2023-07-08 DIAGNOSIS — F411 Generalized anxiety disorder: Secondary | ICD-10-CM

## 2023-07-08 MED ORDER — TRAZODONE HCL 100 MG PO TABS: 100.0000 mg | ORAL_TABLET | Freq: Every evening | ORAL | 0 refills | Status: DC | PRN

## 2023-07-08 MED ORDER — CYANOCOBALAMIN 1000 MCG/ML IJ SOLN
1000.0000 ug | Freq: Once | INTRAMUSCULAR | Status: AC
Start: 2023-07-08 — End: 2023-07-08
  Administered 2023-07-08: 1000 ug via INTRAMUSCULAR

## 2023-07-08 NOTE — Progress Notes (Signed)
 Patient seen in office today for B12 injection per Dr. Yetta Barre. B12 1000 mcg given in left IM and patient tolerated injection well today.

## 2023-07-09 NOTE — Telephone Encounter (Signed)
 Message take care of by Dr. Yetta Barre and patient informed today.

## 2023-07-09 NOTE — Telephone Encounter (Signed)
 Next weekly injections scheduled for this month.

## 2023-07-15 ENCOUNTER — Ambulatory Visit (INDEPENDENT_AMBULATORY_CARE_PROVIDER_SITE_OTHER): Payer: BC Managed Care – PPO

## 2023-07-15 DIAGNOSIS — E538 Deficiency of other specified B group vitamins: Secondary | ICD-10-CM | POA: Diagnosis not present

## 2023-07-15 MED ORDER — CYANOCOBALAMIN 1000 MCG/ML IJ SOLN
1000.0000 ug | Freq: Once | INTRAMUSCULAR | Status: AC
  Administered 2023-07-15: 1000 ug via INTRAMUSCULAR

## 2023-07-15 NOTE — Progress Notes (Signed)
 Patient visits today to receive her B12 injection. Patient was informed and tolerated well. Patient was notified to reach out to [name] if needed.

## 2023-07-22 ENCOUNTER — Ambulatory Visit (INDEPENDENT_AMBULATORY_CARE_PROVIDER_SITE_OTHER): Payer: BC Managed Care – PPO

## 2023-07-22 DIAGNOSIS — E538 Deficiency of other specified B group vitamins: Secondary | ICD-10-CM | POA: Diagnosis not present

## 2023-07-22 MED ORDER — CYANOCOBALAMIN 1000 MCG/ML IJ SOLN
1000.0000 ug | Freq: Once | INTRAMUSCULAR | Status: AC
  Administered 2023-07-22: 1000 ug via INTRAMUSCULAR

## 2023-07-22 NOTE — Progress Notes (Signed)
Patient visits today for their b-12 injection. Patient informed of what they had received and tolerated injection well. Patient notified to reach out to office if needed.

## 2023-07-28 ENCOUNTER — Other Ambulatory Visit: Payer: BC Managed Care – PPO | Admitting: Pharmacist

## 2023-07-28 ENCOUNTER — Other Ambulatory Visit: Payer: BC Managed Care – PPO

## 2023-07-28 DIAGNOSIS — I10 Essential (primary) hypertension: Secondary | ICD-10-CM

## 2023-07-28 NOTE — Patient Instructions (Signed)
It was a pleasure speaking with you today!  Please check your blood pressure at home and notify us if you are often getting readings above 130/80.  Feel free to call with any questions or concerns!  Arbutus Leas, PharmD, BCPS, CPP Clinical Pharmacist Practitioner [facility] [phone]

## 2023-07-28 NOTE — Progress Notes (Signed)
   07/28/2023 Name: Shenetha Gellner MRN: [id number] DOB: [birth year]  Chief Complaint  Patient presents with   Hypertension   Medication Management    Ethelda Dulworth is a 38 y.o. year old female who presented for a follow up telephone visit.   They were referred to the pharmacist by their PCP for assistance in managing hypertension and hypokalemia .     Subjective:  Care Team: Primary Care Provider: Etta Grandchild, MD ; Next Scheduled Visit: 07/05/2023 Wound clinic, next visit: 04/13/2023  Medication Access/Adherence  Current Pharmacy:  [id number], [place] - [address] [place] [address] Phone: [phone] Fax: [phone]    Patient reports affordability concerns with their medications: No  Patient reports access/transportation concerns to their pharmacy: No  Patient reports adherence concerns with their medications:  No       Hypertension/hypokalemia:  Current medications: spironolactone 50 mg daily, nebivolol 10 mg daily, prazosin 1 mg daily, potassium 10 mEQ BID   Patient does have a validated, automated, upper arm home BP cuff Pt notes she has checked it but can't remember the numbers   Objective:  BP Readings from Last 3 Encounters:  07/05/23 138/86  03/30/23 118/76  03/19/23 (!) 129/97     Lab Results  Component Value Date   HGBA1C 4.5 (L) 02/02/2023    Lab Results  Component Value Date   CREATININE 0.58 07/05/2023   BUN 12 07/05/2023   NA 138 07/05/2023   K 3.8 07/05/2023   CL 101 07/05/2023   CO2 24 07/05/2023    Lab Results  Component Value Date   CHOL 193 02/11/2022   HDL 82.60 02/11/2022   LDLCALC 84 02/11/2022   LDLDIRECT 87.0 10/17/2020   TRIG 134.0 02/11/2022   CHOLHDL 2 02/11/2022    Medications Reviewed Today   Medications were not reviewed in this encounter       Assessment/Plan:   Hypertension: - Currently uncontrolled, BP goal <130/80 - Reviewed BP goal  <130/80 - advised her to check at home and call if she is frequently getting readings above 130/80 _ Last BMP showed potassium back WNL    Follow Up Plan: PRN  Arbutus Leas, PharmD, BCPS, CPP Clinical Pharmacist Practitioner [facility] [phone]

## 2023-08-02 DIAGNOSIS — H18622 Keratoconus, unstable, left eye: Secondary | ICD-10-CM | POA: Diagnosis not present

## 2023-08-06 DIAGNOSIS — F339 Major depressive disorder, recurrent, unspecified: Secondary | ICD-10-CM | POA: Diagnosis not present

## 2023-08-06 DIAGNOSIS — E669 Obesity, unspecified: Secondary | ICD-10-CM | POA: Diagnosis not present

## 2023-08-06 DIAGNOSIS — F411 Generalized anxiety disorder: Secondary | ICD-10-CM | POA: Diagnosis not present

## 2023-08-06 DIAGNOSIS — G4700 Insomnia, unspecified: Secondary | ICD-10-CM | POA: Diagnosis not present

## 2023-08-09 ENCOUNTER — Other Ambulatory Visit: Payer: Self-pay | Admitting: Internal Medicine

## 2023-08-09 DIAGNOSIS — I10 Essential (primary) hypertension: Secondary | ICD-10-CM

## 2023-08-16 ENCOUNTER — Encounter: Payer: Self-pay | Admitting: Internal Medicine

## 2023-08-17 NOTE — Progress Notes (Unsigned)
Office Visit Note  Patient: [name]             Date of Birth: 1986-05-30           MRN: [id number]             PCP: Etta Grandchild, MD Referring: Etta Grandchild, MD Visit Date: 08/18/2023 Occupation: @GUAROCC @  Subjective:  No chief complaint on file.   History of Present Illness: [name] is a 38 y.o. female ***     Activities of Daily Living:  Patient reports morning stiffness for *** {minute/hour:19697}.   Patient {ACTIONS;DENIES/REPORTS:21021675::"Denies"} nocturnal pain.  Difficulty dressing/grooming: {ACTIONS;DENIES/REPORTS:21021675::"Denies"} Difficulty climbing stairs: {ACTIONS;DENIES/REPORTS:21021675::"Denies"} Difficulty getting out of chair: {ACTIONS;DENIES/REPORTS:21021675::"Denies"} Difficulty using hands for taps, buttons, cutlery, and/or writing: {ACTIONS;DENIES/REPORTS:21021675::"Denies"}  No Rheumatology ROS completed.   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   High serum parathyroid hormone (PTH) 02/11/2022   GAD (generalized anxiety disorder) 10/27/2021   Hypokalemia 10/01/2021   Asthmatic bronchitis , chronic (HCC) 01/09/2021   Current severe episode of major depressive disorder without psychotic features without prior episode (HCC) 10/17/2020   Dietary folate deficiency anemia 04/11/2020   Primary hypertension 04/10/2020   Obstructive sleep apnea syndrome  04/10/2020   PMS (premenstrual syndrome) 03/17/2018   Primary insomnia 07/28/2017   Morbid obesity with BMI of 50.0-59.9, adult (HCC) 07/28/2017   Chronic zinc deficiency 06/25/2015   B12 deficiency 06/20/2015   Vitamin D deficiency 06/20/2013   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      Family History  Problem Relation Age of Onset   Hypertension Father    Hypertension Mother    Alcohol abuse Neg Hx    Arthritis Neg Hx    Cancer Neg Hx    Early death Neg Hx    Heart disease Neg Hx    Hyperlipidemia Neg Hx    Kidney disease Neg Hx    Stroke Neg Hx    Past Surgical History:  Procedure Laterality Date   BREATH TEK H PYLORI  02/09/2012   Procedure: BREATH TEK H PYLORI;  Surgeon: Valarie Merino, MD;  Location: [facility];  Service: General;  Laterality: N/A;   GASTRIC BYPASS  08/29/2012   HERNIA REPAIR  2014   Social History   Social History Narrative   Are you right handed or left handed? Right Handed   Are you currently employed ? Yes  What is your current occupation? [employer]   Do you live at home alone? No   Who lives with you? Children    What type of home do you live in: 1 story or 2 story? Lives in two story home       Immunization History  Administered Date(s) Administered   DTaP 01/27/2006   MMR 01/17/2014   Tdap 01/16/2014     Objective: Vital Signs: There were no vitals taken for this visit.   Physical Exam    Musculoskeletal Exam: ***  CDAI Exam: CDAI Score: -- Patient Global: --; Provider Global: -- Swollen: --; Tender: -- Joint Exam 08/18/2023   No joint exam has been documented for this visit   There is currently no information documented on the homunculus. Go to the Rheumatology activity and complete the homunculus joint exam.  Investigation: No additional findings.  Imaging: No results found.  Recent Labs: Lab Results  Component Value Date   WBC 5.0 07/05/2023   HGB 10.0 (L) 07/05/2023   PLT 340.0 07/05/2023   NA 138 07/05/2023   K 3.8 07/05/2023   CL 101 07/05/2023   CO2 24 07/05/2023   GLUCOSE 77 07/05/2023   BUN 12 07/05/2023   CREATININE 0.58 07/05/2023   BILITOT 0.5 03/30/2023   ALKPHOS 84 03/30/2023   AST 18 03/30/2023   ALT 16 03/30/2023   PROT 7.0 03/30/2023   ALBUMIN 3.7 03/30/2023   CALCIUM 9.5 07/05/2023   GFRAA >60 03/17/2020    Speciality Comments: No specialty comments available.  Procedures:  No procedures performed Allergies: Lyrica [pregabalin]   Assessment / Plan:     Visit Diagnoses: No diagnosis found.  Orders: No orders of the defined types were placed in this encounter.  No orders of the defined types were placed in this encounter.   Face-to-face time spent with patient was *** minutes. Greater than 50% of time was spent in counseling and coordination of care.  Follow-Up Instructions: No follow-ups on file.   Fuller Plan, MD  Note - This record has been created using [employer].  Chart creation errors have been sought, but may not always  have been located. Such creation errors do not reflect on  the standard of medical care.

## 2023-08-18 ENCOUNTER — Encounter: Payer: Self-pay | Admitting: Internal Medicine

## 2023-08-18 ENCOUNTER — Ambulatory Visit: Payer: BC Managed Care – PPO | Attending: Internal Medicine | Admitting: Internal Medicine

## 2023-08-18 VITALS — BP 146/94 | HR 80 | Resp 14 | Ht 70.5 in | Wt 310.0 lb

## 2023-08-18 DIAGNOSIS — I776 Arteritis, unspecified: Secondary | ICD-10-CM

## 2023-08-18 DIAGNOSIS — R899 Unspecified abnormal finding in specimens from other organs, systems and tissues: Secondary | ICD-10-CM

## 2023-08-19 LAB — IGG, IGA, IGM
IgG (Immunoglobin G), Serum: 1470 mg/dL (ref 600–1640)
IgM, Serum: 33 mg/dL — ABNORMAL LOW (ref 50–300)
Immunoglobulin A: 252 mg/dL (ref 47–310)

## 2023-08-19 LAB — SEDIMENTATION RATE: Sed Rate: 14 mm/h (ref 0–20)

## 2023-08-19 LAB — C3 AND C4
C3 Complement: 156 mg/dL (ref 83–193)
C4 Complement: 20 mg/dL (ref 15–57)

## 2023-08-19 LAB — PROTEIN / CREATININE RATIO, URINE
Creatinine, Urine: 127 mg/dL (ref 20–275)
Protein/Creat Ratio: 110 mg/g{creat} (ref 24–184)
Protein/Creatinine Ratio: 0.11 mg/mg{creat} (ref 0.024–0.184)
Total Protein, Urine: 14 mg/dL (ref 5–24)

## 2023-08-26 ENCOUNTER — Ambulatory Visit: Payer: BC Managed Care – PPO

## 2023-09-03 ENCOUNTER — Ambulatory Visit (INDEPENDENT_AMBULATORY_CARE_PROVIDER_SITE_OTHER): Payer: BC Managed Care – PPO

## 2023-09-03 DIAGNOSIS — E538 Deficiency of other specified B group vitamins: Secondary | ICD-10-CM | POA: Diagnosis not present

## 2023-09-03 MED ORDER — CYANOCOBALAMIN 1000 MCG/ML IJ SOLN
1000.0000 ug | Freq: Once | INTRAMUSCULAR | Status: AC
  Administered 2023-09-03: 1000 ug via INTRAMUSCULAR

## 2023-09-03 NOTE — Progress Notes (Signed)
 Patient visits today to receive her B12 injection/vaccine. Patient was informed and tolerated well. Patient was notified to reach out to [name] if needed.

## 2023-09-14 DIAGNOSIS — Z111 Encounter for screening for respiratory tuberculosis: Secondary | ICD-10-CM | POA: Diagnosis not present

## 2023-09-14 DIAGNOSIS — Z79899 Other long term (current) drug therapy: Secondary | ICD-10-CM | POA: Diagnosis not present

## 2023-09-14 DIAGNOSIS — I890 Lymphedema, not elsewhere classified: Secondary | ICD-10-CM | POA: Diagnosis not present

## 2023-09-14 DIAGNOSIS — L400 Psoriasis vulgaris: Secondary | ICD-10-CM | POA: Diagnosis not present

## 2023-09-21 ENCOUNTER — Other Ambulatory Visit: Payer: Self-pay | Admitting: Internal Medicine

## 2023-09-21 DIAGNOSIS — E559 Vitamin D deficiency, unspecified: Secondary | ICD-10-CM

## 2023-09-21 DIAGNOSIS — G5793 Unspecified mononeuropathy of bilateral lower limbs: Secondary | ICD-10-CM

## 2023-09-21 DIAGNOSIS — G990 Autonomic neuropathy in diseases classified elsewhere: Secondary | ICD-10-CM

## 2023-09-21 DIAGNOSIS — Z9884 Bariatric surgery status: Secondary | ICD-10-CM

## 2023-09-21 DIAGNOSIS — I776 Arteritis, unspecified: Secondary | ICD-10-CM

## 2023-09-21 MED ORDER — VITAMIN D (ERGOCALCIFEROL) 1.25 MG (50000 UNIT) PO CAPS: 50000.0000 [IU] | ORAL_CAPSULE | ORAL | 0 refills | Status: DC

## 2023-09-22 MED ORDER — HYDROMORPHONE HCL 2 MG PO TABS
2.0000 mg | ORAL_TABLET | Freq: Four times a day (QID) | ORAL | 0 refills | Status: DC | PRN
Start: 2023-09-22 — End: 2023-10-25

## 2023-09-23 ENCOUNTER — Ambulatory Visit (INDEPENDENT_AMBULATORY_CARE_PROVIDER_SITE_OTHER): Payer: BC Managed Care – PPO

## 2023-09-23 DIAGNOSIS — E538 Deficiency of other specified B group vitamins: Secondary | ICD-10-CM | POA: Diagnosis not present

## 2023-09-23 MED ORDER — CYANOCOBALAMIN 1000 MCG/ML IJ SOLN
1000.0000 ug | Freq: Once | INTRAMUSCULAR | Status: AC
  Administered 2023-09-23: 1000 ug via INTRAMUSCULAR

## 2023-09-23 NOTE — Progress Notes (Signed)
 Patient visits today for their b-12 injection. Patient informed of what they had received and tolerated injection well. Patient notified to reach out to office if needed.

## 2023-09-24 ENCOUNTER — Telehealth: Payer: Self-pay | Admitting: Pharmacy Technician

## 2023-09-24 ENCOUNTER — Other Ambulatory Visit (HOSPITAL_COMMUNITY): Payer: Self-pay

## 2023-09-24 NOTE — Telephone Encounter (Signed)
 Prior Authorization form/request asks a question that requires your assistance. Please see the question below and advise accordingly. The PA will not be submitted until the necessary information is received.

## 2023-09-27 ENCOUNTER — Other Ambulatory Visit: Payer: Self-pay | Admitting: Internal Medicine

## 2023-09-27 ENCOUNTER — Other Ambulatory Visit (HOSPITAL_COMMUNITY): Payer: Self-pay

## 2023-09-27 DIAGNOSIS — E876 Hypokalemia: Secondary | ICD-10-CM

## 2023-09-27 NOTE — Telephone Encounter (Signed)
 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

## 2023-09-28 ENCOUNTER — Encounter: Payer: Self-pay | Admitting: Obstetrics and Gynecology

## 2023-09-28 ENCOUNTER — Other Ambulatory Visit (HOSPITAL_COMMUNITY): Payer: Self-pay

## 2023-09-28 ENCOUNTER — Other Ambulatory Visit (HOSPITAL_COMMUNITY)
Admission: RE | Admit: 2023-09-28 | Discharge: 2023-09-28 | Disposition: A | Discharge status: Home / Self Care | Source: Ambulatory Visit | Attending: Obstetrics and Gynecology | Admitting: Obstetrics and Gynecology

## 2023-09-28 ENCOUNTER — Ambulatory Visit (INDEPENDENT_AMBULATORY_CARE_PROVIDER_SITE_OTHER): Admitting: Obstetrics and Gynecology

## 2023-09-28 VITALS — BP 139/92 | HR 78 | Ht 69.0 in | Wt 307.6 lb

## 2023-09-28 DIAGNOSIS — Z3202 Encounter for pregnancy test, result negative: Secondary | ICD-10-CM | POA: Diagnosis not present

## 2023-09-28 DIAGNOSIS — Z113 Encounter for screening for infections with a predominantly sexual mode of transmission: Secondary | ICD-10-CM | POA: Diagnosis not present

## 2023-09-28 DIAGNOSIS — Z01419 Encounter for gynecological examination (general) (routine) without abnormal findings: Secondary | ICD-10-CM | POA: Diagnosis not present

## 2023-09-28 LAB — POCT URINE PREGNANCY: Preg Test, Ur: NEGATIVE

## 2023-09-28 NOTE — Progress Notes (Signed)
 Patient presents for Annual.  LMP: Patient's last menstrual period was 08/18/2023 (approximate).  Last pap: Date: 2018 Contraception: None Mammogram: Not yet indicated STD Screening: Accepts Flu Vaccine : N/A  CC:  annual, periods becoming irregular from time to time

## 2023-09-28 NOTE — Progress Notes (Signed)
 Subjective:     [name] is a 38 y.o. female P1 with LMP 08/18/2023 and BMI 45 who is here for a comprehensive physical exam. The patient reports no problems. She reports a monthly period lasting 3-4 days. She states it is not uncommon for her to be a week late in her cycle. She is sexually active using condoms for contraception. She denies pelvic pain or abnormal discharge. She denies urinary incontinence. She reports regular bowel movements. Patient is without any complaints  Past Medical History:  Diagnosis Date   Anemia    B12 deficiency 06/20/2015   Blood transfusion without reported diagnosis 07/07/2003   GAD (generalized anxiety disorder) 10/27/2021   Morbid obesity (HCC)    Sleep apnea    hx of   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     Past Surgical History:  Procedure Laterality Date   BREATH TEK H PYLORI  02/09/2012   Procedure: BREATH TEK H PYLORI;  Surgeon: Valarie Merino, MD;  Location: [facility];  Service: General;  Laterality: N/A;   GASTRIC BYPASS  08/29/2012   HERNIA REPAIR  2014   Family History  Problem Relation Age of Onset   Hypertension Mother    Hypertension Father    Asthma Brother    Alcohol abuse Neg Hx    Arthritis Neg Hx    Cancer Neg Hx    Early death Neg Hx    Heart disease Neg Hx    Hyperlipidemia Neg Hx    Kidney disease Neg Hx    Stroke Neg Hx    Social History   Socioeconomic History   Marital status: Single    Spouse name: Not on file   Number of children: Not on file   Years of education: Not on file   Highest education level: Some college, no degree  Occupational History   Not on file  Tobacco Use   Smoking status: Never    Passive exposure: Never   Smokeless tobacco: Never  Vaping Use   Vaping status: Former  Substance and Sexual Activity    Alcohol use: No   Drug use: No   Sexual activity: Not Currently    Partners: Male    Birth control/protection: Pill  Other Topics Concern   Not on file  Social History Narrative   Are you right handed or left handed? Right Handed   Are you currently employed ? Yes   What is your current occupation? [employer]   Do you live at home alone? No   Who lives with you? Children    What type of home do you live in: 1 story or 2 story? Lives in two story home       Social Drivers of Health   Financial Resource Strain: High Risk (02/01/2023)   Overall Financial Resource Strain (CARDIA)    Difficulty of Paying Living Expenses: Hard  Food Insecurity: No Food Insecurity (03/23/2023)   Hunger Vital Sign    Worried About Running Out of Food in the Last Year: Never true    Ran Out of Food in the Last Year: Never true  Recent Concern: Food Insecurity - Food Insecurity Present (02/01/2023)   Hunger Vital Sign    Worried About Running Out of Food in the Last Year: Sometimes true  Ran Out of Food in the Last Year: Never true  Transportation Needs: No Transportation Needs (03/23/2023)   PRAPARE - Administrator, Civil Service (Medical): No    Lack of Transportation (Non-Medical): No  Physical Activity: Unknown (02/01/2023)   Exercise Vital Sign    Days of Exercise per Week: 0 days    Minutes of Exercise per Session: Not on file  Stress: Stress Concern Present (02/01/2023)   [employer] of Occupational Health - Occupational Stress Questionnaire    Feeling of Stress : Very much  Social Connections: Socially Isolated (02/01/2023)   Social Connection and Isolation Panel [NHANES]    Frequency of Communication with Friends and Family: More than three times a week    Frequency of Social Gatherings with Friends and Family: Once a week    Attends Religious Services: Never    Database administrator or Organizations: No    Attends Engineer, structural: Not on file     Marital Status: Never married  Intimate Partner Violence: Not At Risk (03/17/2023)   Humiliation, Afraid, Rape, and Kick questionnaire    Fear of Current or Ex-Partner: No    Emotionally Abused: No    Physically Abused: No    Sexually Abused: No   Health Maintenance  Topic Date Due   Pneumococcal Vaccine 84-45 Years old (1 of 2 - PCV) Never done   Cervical Cancer Screening (HPV/Pap Cotest)  04/26/2022   DTaP/Tdap/Td (3 - Td or Tdap) 01/17/2024   Hepatitis C Screening  Completed   HIV Screening  Completed   HPV VACCINES  Aged Out   INFLUENZA VACCINE  Discontinued   COVID-19 Vaccine  Discontinued       Review of Systems Pertinent items noted in HPI and remainder of comprehensive ROS otherwise negative.   Objective:  Blood pressure (!) 139/92, pulse 78, height 5\' 9"  (1.753 m), weight (!) 307 lb 9.6 oz (139.5 kg), last menstrual period 08/18/2023.   GENERAL: Well-developed, well-nourished female in no acute distress.  HEENT: Normocephalic, atraumatic. Sclerae anicteric.  NECK: Supple. Normal thyroid.  LUNGS: Clear to auscultation bilaterally.  HEART: Regular rate and rhythm. BREASTS: Symmetric in size. No palpable masses or lymphadenopathy, skin changes, or nipple drainage. Bilateral rash under breasts ABDOMEN: Soft, nontender, nondistended. No organomegaly. PELVIC: Normal external female genitalia. Vagina is pink and rugated.  Normal discharge. Normal appearing cervix. Uterus is normal in size. No adnexal mass or tenderness. Chaperone present during the pelvic exam EXTREMITIES: No cyanosis, clubbing, or edema, 2+ distal pulses.     Assessment:    Healthy female exam.      Plan:    Pap smear collected STI screening per patient request Patient will be contacted with abnormal results Patient reports being treated for her rash  See After Visit Summary for Counseling Recommendations

## 2023-09-28 NOTE — Telephone Encounter (Signed)
 Pharmacy Patient Advocate Encounter  Received notification from [facility] that Prior Authorization for HYDROMORPHONE HCI 2MG   has been APPROVED from 09/28/23 to 03/26/24  The initial first fill must be a 7 day supply (28 tablets for 7 days)      PA #/Case ID/Reference #:  [id number]

## 2023-09-29 LAB — HIV ANTIBODY (ROUTINE TESTING W REFLEX): HIV Screen 4th Generation wRfx: NONREACTIVE

## 2023-09-29 LAB — HEPATITIS C ANTIBODY: Hep C Virus Ab: NONREACTIVE

## 2023-09-29 LAB — HEPATITIS B SURFACE ANTIGEN: Hepatitis B Surface Ag: NEGATIVE

## 2023-09-29 LAB — RPR: RPR Ser Ql: NONREACTIVE

## 2023-09-30 ENCOUNTER — Other Ambulatory Visit: Payer: Self-pay | Admitting: Internal Medicine

## 2023-09-30 DIAGNOSIS — E669 Obesity, unspecified: Secondary | ICD-10-CM | POA: Diagnosis not present

## 2023-09-30 DIAGNOSIS — F339 Major depressive disorder, recurrent, unspecified: Secondary | ICD-10-CM | POA: Diagnosis not present

## 2023-09-30 DIAGNOSIS — F411 Generalized anxiety disorder: Secondary | ICD-10-CM | POA: Diagnosis not present

## 2023-09-30 DIAGNOSIS — E876 Hypokalemia: Secondary | ICD-10-CM

## 2023-09-30 DIAGNOSIS — G4700 Insomnia, unspecified: Secondary | ICD-10-CM | POA: Diagnosis not present

## 2023-09-30 DIAGNOSIS — I10 Essential (primary) hypertension: Secondary | ICD-10-CM

## 2023-10-01 LAB — CYTOLOGY - PAP
Adequacy: ABSENT
Chlamydia: NEGATIVE
Comment: NEGATIVE
Comment: NEGATIVE
Comment: NEGATIVE
Comment: NEGATIVE
Comment: NEGATIVE
Comment: NORMAL
Diagnosis: NEGATIVE
HPV 16: NEGATIVE
HPV 18 / 45: NEGATIVE
High risk HPV: POSITIVE — AB
Neisseria Gonorrhea: NEGATIVE
Trichomonas: NEGATIVE

## 2023-10-05 MED ORDER — NUVESSA 1.3 % VA GEL: 1.0000 | Freq: Every evening | VAGINAL | 0 refills | Status: AC

## 2023-10-05 NOTE — Addendum Note (Signed)
 Addended by: Catalina Antigua on: 10/05/2023 07:39 AM   Modules accepted: Orders

## 2023-10-17 ENCOUNTER — Encounter: Payer: Self-pay | Admitting: Internal Medicine

## 2023-10-18 ENCOUNTER — Other Ambulatory Visit: Payer: Self-pay | Admitting: Internal Medicine

## 2023-10-18 DIAGNOSIS — E538 Deficiency of other specified B group vitamins: Secondary | ICD-10-CM

## 2023-10-21 ENCOUNTER — Ambulatory Visit: Payer: BC Managed Care – PPO

## 2023-10-22 ENCOUNTER — Telehealth: Payer: Self-pay | Admitting: Internal Medicine

## 2023-10-22 NOTE — Telephone Encounter (Signed)
 Copied from CRM [id number]. Topic: Clinical - Medication Question >> Oct 22, 2023  9:31 AM [name] wrote: Reason for CRM: Patient called regarding her Hydromorphone  prescription. She received a phone call from her preferred pharmacy stating the medication is on backorder. She also contacted other pharmacies and was informed that they are experiencing the same issue. The patient is requesting if Dr. Rochelle Chu can prescribe an alternative medication, as she previously tried Tramadol  and it did not relieve her pain.  Callback Number: [phone]

## 2023-10-25 ENCOUNTER — Other Ambulatory Visit: Payer: Self-pay | Admitting: Internal Medicine

## 2023-10-25 DIAGNOSIS — I776 Arteritis, unspecified: Secondary | ICD-10-CM | POA: Insufficient documentation

## 2023-10-25 DIAGNOSIS — E538 Deficiency of other specified B group vitamins: Secondary | ICD-10-CM

## 2023-10-25 MED ORDER — HYDROCODONE BITARTRATE ER 15 MG PO CP12: 1.0000 | ORAL_CAPSULE | Freq: Two times a day (BID) | ORAL | 0 refills | Status: DC | PRN

## 2023-10-25 NOTE — Telephone Encounter (Signed)
 PLEASE ADVISE.

## 2023-10-27 ENCOUNTER — Ambulatory Visit

## 2023-11-06 ENCOUNTER — Other Ambulatory Visit: Payer: Self-pay | Admitting: Internal Medicine

## 2023-11-06 DIAGNOSIS — I10 Essential (primary) hypertension: Secondary | ICD-10-CM

## 2023-11-11 DIAGNOSIS — Z79899 Other long term (current) drug therapy: Secondary | ICD-10-CM | POA: Diagnosis not present

## 2023-11-11 DIAGNOSIS — L400 Psoriasis vulgaris: Secondary | ICD-10-CM | POA: Diagnosis not present

## 2023-11-11 DIAGNOSIS — L281 Prurigo nodularis: Secondary | ICD-10-CM | POA: Diagnosis not present

## 2023-11-17 ENCOUNTER — Telehealth: Payer: Self-pay

## 2023-11-17 ENCOUNTER — Other Ambulatory Visit (HOSPITAL_COMMUNITY): Payer: Self-pay

## 2023-11-17 ENCOUNTER — Encounter (HOSPITAL_BASED_OUTPATIENT_CLINIC_OR_DEPARTMENT_OTHER): Attending: Internal Medicine | Admitting: Internal Medicine

## 2023-11-17 DIAGNOSIS — L97521 Non-pressure chronic ulcer of other part of left foot limited to breakdown of skin: Secondary | ICD-10-CM | POA: Diagnosis not present

## 2023-11-17 DIAGNOSIS — L409 Psoriasis, unspecified: Secondary | ICD-10-CM | POA: Insufficient documentation

## 2023-11-17 DIAGNOSIS — L97522 Non-pressure chronic ulcer of other part of left foot with fat layer exposed: Secondary | ICD-10-CM | POA: Diagnosis not present

## 2023-11-17 DIAGNOSIS — L958 Other vasculitis limited to the skin: Secondary | ICD-10-CM | POA: Insufficient documentation

## 2023-11-17 DIAGNOSIS — L959 Vasculitis limited to the skin, unspecified: Secondary | ICD-10-CM | POA: Diagnosis not present

## 2023-11-17 NOTE — Telephone Encounter (Signed)
 Pharmacy Patient Advocate Encounter  Received notification from [facility] that Prior Authorization for Hydrocodone  bitartrate 15 ER caps has been APPROVED from 11/17/23 to 05/15/24. Ran test claim, Copay is $0.00. This test claim was processed through [facility]- copay amounts may vary at other pharmacies due to pharmacy/plan contracts, or as the patient moves through the different stages of their insurance plan.   PA #/Case ID/Reference #: [id number]

## 2023-11-17 NOTE — Telephone Encounter (Signed)
 Patient has been made aware. Gave a verbal understanding.

## 2023-11-17 NOTE — Telephone Encounter (Signed)
 Pharmacy Patient Advocate Encounter   Received notification from Patient Pharmacy that prior authorization for Hydrocodone  bitartrate ER 15 caps is required/requested.   Insurance verification completed.   The patient is insured through [facility] .   Per test claim: PA required; PA submitted to above mentioned insurance via CoverMyMeds Key/confirmation #/EOC [id number] Status is pending

## 2023-11-18 DIAGNOSIS — L97521 Non-pressure chronic ulcer of other part of left foot limited to breakdown of skin: Secondary | ICD-10-CM | POA: Diagnosis not present

## 2023-11-25 ENCOUNTER — Encounter (HOSPITAL_BASED_OUTPATIENT_CLINIC_OR_DEPARTMENT_OTHER): Admitting: General Surgery

## 2023-11-25 DIAGNOSIS — L97521 Non-pressure chronic ulcer of other part of left foot limited to breakdown of skin: Secondary | ICD-10-CM | POA: Diagnosis not present

## 2023-11-25 DIAGNOSIS — L409 Psoriasis, unspecified: Secondary | ICD-10-CM | POA: Diagnosis not present

## 2023-11-25 DIAGNOSIS — L958 Other vasculitis limited to the skin: Secondary | ICD-10-CM | POA: Diagnosis not present

## 2023-11-26 DIAGNOSIS — L97521 Non-pressure chronic ulcer of other part of left foot limited to breakdown of skin: Secondary | ICD-10-CM | POA: Diagnosis not present

## 2023-12-07 ENCOUNTER — Encounter (HOSPITAL_BASED_OUTPATIENT_CLINIC_OR_DEPARTMENT_OTHER): Attending: General Surgery | Admitting: General Surgery

## 2023-12-07 DIAGNOSIS — L958 Other vasculitis limited to the skin: Secondary | ICD-10-CM | POA: Insufficient documentation

## 2023-12-07 DIAGNOSIS — L97521 Non-pressure chronic ulcer of other part of left foot limited to breakdown of skin: Secondary | ICD-10-CM | POA: Diagnosis not present

## 2023-12-07 DIAGNOSIS — L408 Other psoriasis: Secondary | ICD-10-CM | POA: Insufficient documentation

## 2023-12-07 DIAGNOSIS — M67471 Ganglion, right ankle and foot: Secondary | ICD-10-CM | POA: Diagnosis not present

## 2023-12-07 DIAGNOSIS — G4700 Insomnia, unspecified: Secondary | ICD-10-CM | POA: Diagnosis not present

## 2023-12-07 DIAGNOSIS — F411 Generalized anxiety disorder: Secondary | ICD-10-CM | POA: Diagnosis not present

## 2023-12-07 DIAGNOSIS — F339 Major depressive disorder, recurrent, unspecified: Secondary | ICD-10-CM | POA: Diagnosis not present

## 2023-12-07 DIAGNOSIS — E669 Obesity, unspecified: Secondary | ICD-10-CM | POA: Diagnosis not present

## 2023-12-13 ENCOUNTER — Ambulatory Visit (HOSPITAL_BASED_OUTPATIENT_CLINIC_OR_DEPARTMENT_OTHER): Admitting: General Surgery

## 2023-12-16 ENCOUNTER — Ambulatory Visit: Admitting: Internal Medicine

## 2023-12-22 ENCOUNTER — Telehealth: Payer: Self-pay | Admitting: Internal Medicine

## 2023-12-22 DIAGNOSIS — J4489 Other specified chronic obstructive pulmonary disease: Secondary | ICD-10-CM

## 2023-12-27 NOTE — Telephone Encounter (Signed)
 Copied from CRM [id number]. Topic: Clinical - Prescription Issue >> Dec 27, 2023 10:46 AM [name] wrote: Reason for RMF:Ejupzwu called in regarding  levalbuterol  (XOPENEX  HFA) 45 MCG/ACT inhaler , stated she has requested a refilled on the 18th and still hasn't received it and stated that she really needs is

## 2023-12-28 ENCOUNTER — Ambulatory Visit (HOSPITAL_BASED_OUTPATIENT_CLINIC_OR_DEPARTMENT_OTHER): Admitting: General Surgery

## 2023-12-29 NOTE — Telephone Encounter (Signed)
Prescription has since been filled.

## 2024-01-05 ENCOUNTER — Encounter (HOSPITAL_BASED_OUTPATIENT_CLINIC_OR_DEPARTMENT_OTHER): Attending: General Surgery | Admitting: General Surgery

## 2024-01-05 DIAGNOSIS — M67471 Ganglion, right ankle and foot: Secondary | ICD-10-CM | POA: Insufficient documentation

## 2024-01-05 DIAGNOSIS — L958 Other vasculitis limited to the skin: Secondary | ICD-10-CM | POA: Insufficient documentation

## 2024-01-05 DIAGNOSIS — L409 Psoriasis, unspecified: Secondary | ICD-10-CM | POA: Insufficient documentation

## 2024-01-05 DIAGNOSIS — Z6841 Body Mass Index (BMI) 40.0 and over, adult: Secondary | ICD-10-CM | POA: Insufficient documentation

## 2024-01-05 DIAGNOSIS — Z79899 Other long term (current) drug therapy: Secondary | ICD-10-CM | POA: Diagnosis not present

## 2024-01-05 DIAGNOSIS — L97521 Non-pressure chronic ulcer of other part of left foot limited to breakdown of skin: Secondary | ICD-10-CM | POA: Diagnosis not present

## 2024-01-06 DIAGNOSIS — L97521 Non-pressure chronic ulcer of other part of left foot limited to breakdown of skin: Secondary | ICD-10-CM | POA: Diagnosis not present

## 2024-01-19 ENCOUNTER — Ambulatory Visit (HOSPITAL_BASED_OUTPATIENT_CLINIC_OR_DEPARTMENT_OTHER): Admitting: General Surgery

## 2024-01-21 ENCOUNTER — Encounter: Payer: Self-pay | Admitting: Advanced Practice Midwife

## 2024-01-21 DIAGNOSIS — F411 Generalized anxiety disorder: Secondary | ICD-10-CM | POA: Diagnosis not present

## 2024-01-21 DIAGNOSIS — G4700 Insomnia, unspecified: Secondary | ICD-10-CM | POA: Diagnosis not present

## 2024-01-21 DIAGNOSIS — E669 Obesity, unspecified: Secondary | ICD-10-CM | POA: Diagnosis not present

## 2024-01-21 DIAGNOSIS — F339 Major depressive disorder, recurrent, unspecified: Secondary | ICD-10-CM | POA: Diagnosis not present

## 2024-03-09 ENCOUNTER — Other Ambulatory Visit: Payer: Self-pay | Admitting: Internal Medicine

## 2024-03-09 DIAGNOSIS — J4489 Other specified chronic obstructive pulmonary disease: Secondary | ICD-10-CM

## 2024-03-14 ENCOUNTER — Encounter (HOSPITAL_BASED_OUTPATIENT_CLINIC_OR_DEPARTMENT_OTHER): Attending: General Surgery | Admitting: General Surgery

## 2024-03-14 DIAGNOSIS — L409 Psoriasis, unspecified: Secondary | ICD-10-CM | POA: Diagnosis not present

## 2024-03-14 DIAGNOSIS — M67471 Ganglion, right ankle and foot: Secondary | ICD-10-CM | POA: Diagnosis not present

## 2024-03-14 DIAGNOSIS — L958 Other vasculitis limited to the skin: Secondary | ICD-10-CM | POA: Diagnosis not present

## 2024-03-14 DIAGNOSIS — L97522 Non-pressure chronic ulcer of other part of left foot with fat layer exposed: Secondary | ICD-10-CM | POA: Insufficient documentation

## 2024-03-14 NOTE — Telephone Encounter (Signed)
 MyChart message sent to patient to call office and set up an in-office appt. Last OV was 07/05/23.

## 2024-03-20 ENCOUNTER — Other Ambulatory Visit: Payer: Self-pay | Admitting: Internal Medicine

## 2024-03-20 DIAGNOSIS — I776 Arteritis, unspecified: Secondary | ICD-10-CM

## 2024-03-20 DIAGNOSIS — G63 Polyneuropathy in diseases classified elsewhere: Secondary | ICD-10-CM

## 2024-03-20 NOTE — Telephone Encounter (Signed)
 Copied from CRM [id number]. Topic: Clinical - Medication Refill >> Mar 20, 2024 11:35 AM Chiquita SQUIBB wrote: Medication:  HYDROcodone  Bitartrate ER HYDROcodone  Bitartrate ER 15 MG CP12  Patient is requesting this as soon as possible   Has the patient contacted their pharmacy? Yes (Agent: If no, request that the patient contact the pharmacy for the refill. If patient does not wish to contact the pharmacy document the reason why and proceed with request.) (Agent: If yes, when and what did the pharmacy advise?)  This is the patient's preferred pharmacy:  [phone] [place] - [address] [place] [address] Phone: [phone] Fax: [phone]   Is this the correct pharmacy for this prescription? Yes If no, delete pharmacy and type the correct one.   Has the prescription been filled recently? No  Is the patient out of the medication? Yes  Has the patient been seen for an appointment in the last year OR does the patient have an upcoming appointment? Yes  Can we respond through MyChart? Yes  Agent: Please be advised that Rx refills may take up to 3 business days. We ask that you follow-up with your pharmacy.

## 2024-03-28 ENCOUNTER — Encounter (HOSPITAL_BASED_OUTPATIENT_CLINIC_OR_DEPARTMENT_OTHER): Admitting: General Surgery

## 2024-04-18 ENCOUNTER — Ambulatory Visit: Admitting: Internal Medicine

## 2024-05-15 DIAGNOSIS — G4700 Insomnia, unspecified: Secondary | ICD-10-CM | POA: Diagnosis not present

## 2024-05-15 DIAGNOSIS — F339 Major depressive disorder, recurrent, unspecified: Secondary | ICD-10-CM | POA: Diagnosis not present

## 2024-05-15 DIAGNOSIS — F411 Generalized anxiety disorder: Secondary | ICD-10-CM | POA: Diagnosis not present

## 2024-05-15 DIAGNOSIS — E669 Obesity, unspecified: Secondary | ICD-10-CM | POA: Diagnosis not present

## 2024-05-16 DIAGNOSIS — L400 Psoriasis vulgaris: Secondary | ICD-10-CM | POA: Diagnosis not present

## 2024-05-16 DIAGNOSIS — Z79899 Other long term (current) drug therapy: Secondary | ICD-10-CM | POA: Diagnosis not present

## 2024-05-16 DIAGNOSIS — Z79620 Long term (current) use of immunosuppressive biologic: Secondary | ICD-10-CM | POA: Diagnosis not present

## 2024-05-16 DIAGNOSIS — L281 Prurigo nodularis: Secondary | ICD-10-CM | POA: Diagnosis not present

## 2024-05-19 ENCOUNTER — Other Ambulatory Visit: Payer: Self-pay | Admitting: Internal Medicine

## 2024-05-19 DIAGNOSIS — J4489 Other specified chronic obstructive pulmonary disease: Secondary | ICD-10-CM

## 2024-05-30 DIAGNOSIS — Z79899 Other long term (current) drug therapy: Secondary | ICD-10-CM | POA: Diagnosis not present

## 2024-06-12 ENCOUNTER — Inpatient Hospital Stay (HOSPITAL_COMMUNITY)

## 2024-06-12 ENCOUNTER — Encounter (HOSPITAL_COMMUNITY): Payer: Self-pay

## 2024-06-12 ENCOUNTER — Emergency Department (HOSPITAL_COMMUNITY)

## 2024-06-12 ENCOUNTER — Other Ambulatory Visit: Payer: Self-pay

## 2024-06-12 ENCOUNTER — Other Ambulatory Visit: Payer: Self-pay | Admitting: Internal Medicine

## 2024-06-12 ENCOUNTER — Inpatient Hospital Stay (HOSPITAL_COMMUNITY)
Admission: EM | Admit: 2024-06-12 | Discharge: 2024-06-17 | DRG: 193 | Disposition: A | Discharge status: Home / Self Care | Attending: Internal Medicine | Admitting: Internal Medicine

## 2024-06-12 DIAGNOSIS — J9601 Acute respiratory failure with hypoxia: Secondary | ICD-10-CM | POA: Diagnosis present

## 2024-06-12 DIAGNOSIS — J811 Chronic pulmonary edema: Secondary | ICD-10-CM | POA: Diagnosis not present

## 2024-06-12 DIAGNOSIS — E538 Deficiency of other specified B group vitamins: Secondary | ICD-10-CM | POA: Diagnosis present

## 2024-06-12 DIAGNOSIS — Z888 Allergy status to other drugs, medicaments and biological substances status: Secondary | ICD-10-CM

## 2024-06-12 DIAGNOSIS — Z8616 Personal history of COVID-19: Secondary | ICD-10-CM

## 2024-06-12 DIAGNOSIS — Z6841 Body Mass Index (BMI) 40.0 and over, adult: Secondary | ICD-10-CM

## 2024-06-12 DIAGNOSIS — L2084 Intrinsic (allergic) eczema: Secondary | ICD-10-CM | POA: Diagnosis not present

## 2024-06-12 DIAGNOSIS — F411 Generalized anxiety disorder: Secondary | ICD-10-CM | POA: Diagnosis not present

## 2024-06-12 DIAGNOSIS — L400 Psoriasis vulgaris: Secondary | ICD-10-CM | POA: Diagnosis present

## 2024-06-12 DIAGNOSIS — J45909 Unspecified asthma, uncomplicated: Secondary | ICD-10-CM | POA: Diagnosis present

## 2024-06-12 DIAGNOSIS — G629 Polyneuropathy, unspecified: Secondary | ICD-10-CM | POA: Diagnosis present

## 2024-06-12 DIAGNOSIS — Z1152 Encounter for screening for COVID-19: Secondary | ICD-10-CM

## 2024-06-12 DIAGNOSIS — D649 Anemia, unspecified: Secondary | ICD-10-CM | POA: Diagnosis present

## 2024-06-12 DIAGNOSIS — Z9889 Other specified postprocedural states: Secondary | ICD-10-CM

## 2024-06-12 DIAGNOSIS — I110 Hypertensive heart disease with heart failure: Secondary | ICD-10-CM | POA: Diagnosis present

## 2024-06-12 DIAGNOSIS — E876 Hypokalemia: Secondary | ICD-10-CM | POA: Diagnosis not present

## 2024-06-12 DIAGNOSIS — J189 Pneumonia, unspecified organism: Principal | ICD-10-CM | POA: Diagnosis present

## 2024-06-12 DIAGNOSIS — Z825 Family history of asthma and other chronic lower respiratory diseases: Secondary | ICD-10-CM | POA: Diagnosis not present

## 2024-06-12 DIAGNOSIS — Z9884 Bariatric surgery status: Secondary | ICD-10-CM

## 2024-06-12 DIAGNOSIS — I509 Heart failure, unspecified: Principal | ICD-10-CM

## 2024-06-12 DIAGNOSIS — Z8249 Family history of ischemic heart disease and other diseases of the circulatory system: Secondary | ICD-10-CM | POA: Diagnosis not present

## 2024-06-12 DIAGNOSIS — J188 Other pneumonia, unspecified organism: Secondary | ICD-10-CM

## 2024-06-12 DIAGNOSIS — Z79899 Other long term (current) drug therapy: Secondary | ICD-10-CM

## 2024-06-12 DIAGNOSIS — G4730 Sleep apnea, unspecified: Secondary | ICD-10-CM | POA: Diagnosis present

## 2024-06-12 DIAGNOSIS — R918 Other nonspecific abnormal finding of lung field: Secondary | ICD-10-CM | POA: Diagnosis not present

## 2024-06-12 DIAGNOSIS — E518 Other manifestations of thiamine deficiency: Secondary | ICD-10-CM | POA: Diagnosis present

## 2024-06-12 DIAGNOSIS — Z803 Family history of malignant neoplasm of breast: Secondary | ICD-10-CM

## 2024-06-12 DIAGNOSIS — I5033 Acute on chronic diastolic (congestive) heart failure: Secondary | ICD-10-CM | POA: Diagnosis present

## 2024-06-12 DIAGNOSIS — I5021 Acute systolic (congestive) heart failure: Secondary | ICD-10-CM | POA: Diagnosis not present

## 2024-06-12 DIAGNOSIS — R0602 Shortness of breath: Secondary | ICD-10-CM | POA: Diagnosis not present

## 2024-06-12 DIAGNOSIS — I517 Cardiomegaly: Secondary | ICD-10-CM | POA: Diagnosis not present

## 2024-06-12 DIAGNOSIS — R0989 Other specified symptoms and signs involving the circulatory and respiratory systems: Secondary | ICD-10-CM | POA: Diagnosis not present

## 2024-06-12 DIAGNOSIS — J984 Other disorders of lung: Secondary | ICD-10-CM | POA: Diagnosis not present

## 2024-06-12 LAB — BASIC METABOLIC PANEL WITH GFR
Anion gap: 12 (ref 5–15)
BUN: 9 mg/dL (ref 6–20)
CO2: 25 mmol/L (ref 22–32)
Calcium: 8.8 mg/dL — ABNORMAL LOW (ref 8.9–10.3)
Chloride: 100 mmol/L (ref 98–111)
Creatinine, Ser: 0.55 mg/dL (ref 0.44–1.00)
GFR, Estimated: >60 mL/min (ref >60)
Glucose, Bld: 102 mg/dL — ABNORMAL HIGH (ref 70–99)
Potassium: 3.9 mmol/L (ref 3.5–5.1)
Sodium: 138 mmol/L (ref 135–145)

## 2024-06-12 LAB — CBC
HCT: 26.9 % — ABNORMAL LOW (ref 36.0–46.0)
Hemoglobin: 7.5 g/dL — ABNORMAL LOW (ref 12.0–15.0)
MCH: 22.7 pg — ABNORMAL LOW (ref 26.0–34.0)
MCHC: 27.9 g/dL — ABNORMAL LOW (ref 30.0–36.0)
MCV: 81.3 fL (ref 80.0–100.0)
Platelets: 209 K/uL (ref 150–400)
RBC: 3.31 MIL/uL — ABNORMAL LOW (ref 3.87–5.11)
RDW: 25.5 % — ABNORMAL HIGH (ref 11.5–15.5)
WBC: 7.9 K/uL (ref 4.0–10.5)
nRBC: 0 % (ref 0.0–0.2)

## 2024-06-12 LAB — IRON AND TIBC
Iron: 65 ug/dL (ref 28–170)
Saturation Ratios: 10 % — ABNORMAL LOW (ref 10.4–31.8)
TIBC: 636 ug/dL — ABNORMAL HIGH (ref 250–450)
UIBC: 570 ug/dL

## 2024-06-12 LAB — D-DIMER, QUANTITATIVE: D-Dimer, Quant: 0.58 ug{FEU}/mL — ABNORMAL HIGH (ref 0.00–0.50)

## 2024-06-12 LAB — RESP PANEL BY RT-PCR (RSV, FLU A&B, COVID)  RVPGX2
Influenza A by PCR: NEGATIVE
Influenza B by PCR: NEGATIVE
Resp Syncytial Virus by PCR: NEGATIVE
SARS Coronavirus 2 by RT PCR: NEGATIVE

## 2024-06-12 LAB — VITAMIN B12: Vitamin B-12: 243 pg/mL (ref 180–914)

## 2024-06-12 LAB — ECHOCARDIOGRAM COMPLETE
Area-P 1/2: 4.21 cm2
Est EF: >75
Height: 69 in
S' Lateral: 3.3 cm
Weight: 5713.6 [oz_av]

## 2024-06-12 LAB — T4, FREE: Free T4: 1.08 ng/dL (ref 0.61–1.12)

## 2024-06-12 LAB — FOLATE: Folate: 8.5 ng/mL (ref >5.9)

## 2024-06-12 LAB — RETICULOCYTES
Immature Retic Fract: 15 % (ref 2.3–15.9)
RBC.: 3.34 MIL/uL — ABNORMAL LOW (ref 3.87–5.11)
Retic Count, Absolute: 50.4 K/uL (ref 19.0–186.0)
Retic Ct Pct: 1.5 % (ref 0.4–3.1)

## 2024-06-12 LAB — PROCALCITONIN: Procalcitonin: <0.1 ng/mL

## 2024-06-12 LAB — PRO BRAIN NATRIURETIC PEPTIDE: Pro Brain Natriuretic Peptide: 1508 pg/mL — ABNORMAL HIGH (ref <300.0)

## 2024-06-12 LAB — FERRITIN: Ferritin: 11 ng/mL (ref 11–307)

## 2024-06-12 LAB — TSH: TSH: 1.92 u[IU]/mL (ref 0.350–4.500)

## 2024-06-12 LAB — HCG, SERUM, QUALITATIVE: Preg, Serum: NEGATIVE

## 2024-06-12 LAB — TROPONIN T, HIGH SENSITIVITY
Troponin T High Sensitivity: <15 ng/L (ref 0–19)
Troponin T High Sensitivity: <15 ng/L (ref 0–19)

## 2024-06-12 MED ORDER — SODIUM CHLORIDE 0.9 % IV SOLN
500.0000 mg | Freq: Once | INTRAVENOUS | Status: AC
  Administered 2024-06-12: 500 mg via INTRAVENOUS
  Filled 2024-06-12: qty 5

## 2024-06-12 MED ORDER — SODIUM CHLORIDE 0.9 % IV SOLN
1.0000 g | Freq: Once | INTRAVENOUS | Status: AC
  Administered 2024-06-12: 1 g via INTRAVENOUS
  Filled 2024-06-12: qty 10

## 2024-06-12 MED ORDER — ONDANSETRON HCL 4 MG/2ML IJ SOLN: 4.0000 mg | Freq: Four times a day (QID) | INTRAMUSCULAR | Status: DC | PRN

## 2024-06-12 MED ORDER — PERFLUTREN LIPID MICROSPHERE
1.0000 mL | INTRAVENOUS | Status: AC | PRN
  Administered 2024-06-12: 3 mL via INTRAVENOUS

## 2024-06-12 MED ORDER — ONDANSETRON HCL 4 MG PO TABS: 4.0000 mg | ORAL_TABLET | Freq: Four times a day (QID) | ORAL | Status: DC | PRN

## 2024-06-12 MED ORDER — IOHEXOL 350 MG/ML SOLN
75.0000 mL | Freq: Once | INTRAVENOUS | Status: AC | PRN
  Administered 2024-06-12: 75 mL via INTRAVENOUS

## 2024-06-12 MED ORDER — OXYCODONE HCL 5 MG PO TABS
5.0000 mg | ORAL_TABLET | ORAL | Status: DC | PRN
  Administered 2024-06-13 (×5): 5 mg via ORAL
  Filled 2024-06-12 (×5): qty 1

## 2024-06-12 MED ORDER — LORATADINE 10 MG PO TABS: 10.0000 mg | ORAL_TABLET | Freq: Every day | ORAL | Status: DC

## 2024-06-12 MED ORDER — NEBIVOLOL HCL 10 MG PO TABS
10.0000 mg | ORAL_TABLET | Freq: Once | ORAL | Status: AC
  Administered 2024-06-12: 10 mg via ORAL
  Filled 2024-06-12: qty 1

## 2024-06-12 MED ORDER — SODIUM CHLORIDE 0.9 % IV SOLN: 1.0000 g | INTRAVENOUS | Status: DC

## 2024-06-12 MED ORDER — TRAZODONE HCL 50 MG PO TABS
25.0000 mg | ORAL_TABLET | Freq: Every evening | ORAL | Status: DC | PRN
  Administered 2024-06-12 – 2024-06-16 (×4): 25 mg via ORAL
  Filled 2024-06-12 (×5): qty 1

## 2024-06-12 MED ORDER — HYDRALAZINE HCL 20 MG/ML IJ SOLN
5.0000 mg | Freq: Four times a day (QID) | INTRAMUSCULAR | Status: DC | PRN
  Administered 2024-06-12 – 2024-06-13 (×2): 5 mg via INTRAVENOUS
  Filled 2024-06-12 (×3): qty 1

## 2024-06-12 MED ORDER — NITROGLYCERIN 2 % TD OINT
0.5000 [in_us] | TOPICAL_OINTMENT | Freq: Once | TRANSDERMAL | Status: AC
  Administered 2024-06-12: 0.5 [in_us] via TOPICAL
  Filled 2024-06-12: qty 1

## 2024-06-12 MED ORDER — ACETAMINOPHEN 325 MG PO TABS
650.0000 mg | ORAL_TABLET | Freq: Four times a day (QID) | ORAL | Status: DC | PRN
  Administered 2024-06-13 – 2024-06-17 (×8): 650 mg via ORAL
  Filled 2024-06-12 (×8): qty 2

## 2024-06-12 MED ORDER — ENOXAPARIN SODIUM 40 MG/0.4ML IJ SOSY
40.0000 mg | PREFILLED_SYRINGE | Freq: Every day | INTRAMUSCULAR | Status: DC
  Administered 2024-06-12 – 2024-06-16 (×5): 40 mg via SUBCUTANEOUS
  Filled 2024-06-12 (×5): qty 0.4

## 2024-06-12 MED ORDER — SODIUM CHLORIDE 0.9 % IV SOLN
500.0000 mg | INTRAVENOUS | Status: AC
  Administered 2024-06-13 – 2024-06-16 (×4): 500 mg via INTRAVENOUS
  Filled 2024-06-12 (×4): qty 5

## 2024-06-12 MED ORDER — CETIRIZINE HCL 10 MG PO TABS
10.0000 mg | ORAL_TABLET | Freq: Every day | ORAL | Status: DC
  Administered 2024-06-12 – 2024-06-17 (×6): 10 mg via ORAL
  Filled 2024-06-12 (×6): qty 1

## 2024-06-12 MED ORDER — ALBUTEROL SULFATE (2.5 MG/3ML) 0.083% IN NEBU
2.5000 mg | INHALATION_SOLUTION | RESPIRATORY_TRACT | Status: DC | PRN
  Administered 2024-06-12 – 2024-06-13 (×3): 2.5 mg via RESPIRATORY_TRACT
  Filled 2024-06-12 (×3): qty 3

## 2024-06-12 MED ORDER — FUROSEMIDE 10 MG/ML IJ SOLN
40.0000 mg | Freq: Once | INTRAMUSCULAR | Status: AC
  Administered 2024-06-12: 40 mg via INTRAVENOUS
  Filled 2024-06-12: qty 4

## 2024-06-12 MED ORDER — IPRATROPIUM-ALBUTEROL 0.5-2.5 (3) MG/3ML IN SOLN
3.0000 mL | Freq: Once | RESPIRATORY_TRACT | Status: AC
  Administered 2024-06-12: 3 mL via RESPIRATORY_TRACT
  Filled 2024-06-12: qty 3

## 2024-06-12 MED ORDER — ACETAMINOPHEN 650 MG RE SUPP: 650.0000 mg | Freq: Four times a day (QID) | RECTAL | Status: DC | PRN

## 2024-06-12 NOTE — Progress Notes (Signed)
  Echocardiogram 2D Echocardiogram has been performed.  Koleen KANDICE Popper, RDCS 06/12/2024, 3:56 PM

## 2024-06-12 NOTE — ED Triage Notes (Signed)
 Pt. Arrives pov c/o SOB and a cough. Pt. States that she uses an inhaler and has run out. Has had symptoms x2 days along with body aches. Pt. Tires faster on exertion. Denies being around anyone who has been sick. Endorses chest tightness and pain.

## 2024-06-12 NOTE — ED Provider Notes (Signed)
 [place] EMERGENCY DEPARTMENT AT [facility] Provider Note   CSN: [id number] Arrival date & time: 06/12/24  9359     Patient presents with: Shortness of Breath   [name] is a 38 y.o. female.  Patient past history significant for asthmatic bronchitis, iron  deficiency anemia, obesity, hypertension, intrinsic eczema, psoriasis here with concerns of shortness of breath.  Reports that she began experiencing shortness of breath and cough about 1 to 2 days ago.  Was using her home albuterol  but has run out of this.  States that she felt that she was needed to use her albuterol  more frequently with this current episode but states that this was not helping as much typically would.  Did endorse some feelings of bodyaches but denies productive cough, fever, chills, nausea, vomiting, diarrhea.  She does endorse some chest tightness on coughing.  She does also note that she has felt more fatigued and tires faster on exertion.   Shortness of Breath      Prior to Admission medications   Medication Sig Start Date End Date Taking? Authorizing Provider  DULoxetine  (CYMBALTA ) 20 MG capsule Take 20 mg by mouth daily.    [provider]  Ferric Maltol  (ACCRUFER ) 30 MG CAPS Take 1 capsule (30 mg total) by mouth in the morning and at bedtime. 02/03/23   Joshua Debby CROME, MD  folic acid  (FOLVITE ) 1 MG tablet Take 1 tablet (1 mg total) by mouth daily. 07/05/23   Joshua Debby CROME, MD  HYDROcodone  Bitartrate ER 15 MG CP12 Take 1 capsule by mouth 2 (two) times daily as needed. 10/25/23   Joshua Debby CROME, MD  levalbuterol  (XOPENEX  HFA) 45 MCG/ACT inhaler INHALE 2 PUFFS BY MOUTH INTO THE LUNGS EVERY 6 HOURS AS NEEDED FOR WHEEZE. PATIENT WILL NEED TO FOLLOW UP WITH PCP FOR FUTURE REFILLS 03/14/24   Joshua Debby CROME, MD  nebivolol  (BYSTOLIC ) 10 MG tablet TAKE 1 TABLET BY MOUTH EVERY DAY 08/09/23   Joshua Debby CROME, MD  potassium chloride  (KLOR-CON  M10) 10 MEQ tablet Take 1 tablet (10 mEq total)  by mouth 2 (two) times daily. 02/03/23   Joshua Debby CROME, MD  prazosin (MINIPRESS) 2 MG capsule Take 2 mg by mouth at bedtime. 07/27/23   [provider]  spironolactone  (ALDACTONE ) 50 MG tablet Take 1 tablet (50 mg total) by mouth daily. 07/05/23   Joshua Debby CROME, MD  traZODone  (DESYREL ) 100 MG tablet Take 1 tablet (100 mg total) by mouth at bedtime as needed for sleep. 07/08/23   Joshua Debby CROME, MD  Vitamin D , Ergocalciferol , (DRISDOL ) 1.25 MG (50000 UNIT) CAPS capsule Take 1 capsule (50,000 Units total) by mouth every 7 (seven) days. 09/21/23   Joshua Debby CROME, MD  zinc  gluconate 50 MG tablet Take 1 tablet (50 mg total) by mouth daily. 07/05/23   Joshua Debby CROME, MD  FLUoxetine  (PROZAC ) 10 MG tablet Take 1 tablet (10 mg total) by mouth daily. Patient not taking: Reported on 02/01/2019 03/17/18 02/01/19  Joshua Debby CROME, MD  phentermine  37.5 MG capsule Take 1 capsule (37.5 mg total) by mouth every morning. Patient not taking: Reported on 02/01/2019 10/20/18 02/01/19  Joshua Debby CROME, MD    Allergies: Lyrica  [pregabalin ]    Review of Systems  Respiratory:  Positive for shortness of breath.   All other systems reviewed and are negative.   Updated Vital Signs BP (!) 189/98   Pulse (!) 108   Temp 99.1 F (37.3 C) (Oral)   Resp ROLLEN)  22   Ht 5' 9 (1.753 m)   Wt (!) 138.3 kg   LMP 05/13/2024 (Approximate)   SpO2 96%   BMI 45.04 kg/m   Physical Exam Vitals reviewed.  Constitutional:      General: She is not in acute distress.    Appearance: She is well-developed.  HENT:     Head: Normocephalic and atraumatic.  Eyes:     Conjunctiva/sclera: Conjunctivae normal.  Cardiovascular:     Rate and Rhythm: Normal rate and regular rhythm.     Heart sounds: No murmur heard. Pulmonary:     Effort: Pulmonary effort is normal. No respiratory distress.     Breath sounds: Examination of the right-upper field reveals rales. Rales present. No decreased breath sounds, wheezing or rhonchi.   Chest:     Chest wall: No tenderness or edema.  Abdominal:     Palpations: Abdomen is soft.     Tenderness: There is no abdominal tenderness.  Musculoskeletal:        General: No swelling.     Cervical back: Neck supple.     Comments: Trace edema in bilateral lower legs.  Skin:    General: Skin is warm and dry.     Capillary Refill: Capillary refill takes less than 2 seconds.  Neurological:     Mental Status: She is alert.  Psychiatric:        Mood and Affect: Mood normal.     (all labs ordered are listed, but only abnormal results are displayed) Labs Reviewed  BASIC METABOLIC PANEL WITH GFR - Abnormal; Notable for the following components:      Result Value   Glucose, Bld 102 (*)    Calcium 8.8 (*)    All other components within normal limits  CBC - Abnormal; Notable for the following components:   RBC 3.31 (*)    Hemoglobin 7.5 (*)    HCT 26.9 (*)    MCH 22.7 (*)    MCHC 27.9 (*)    RDW 25.5 (*)    All other components within normal limits  PRO BRAIN NATRIURETIC PEPTIDE - Abnormal; Notable for the following components:   Pro Brain Natriuretic Peptide 1,508.0 (*)    All other components within normal limits  D-DIMER, QUANTITATIVE (NOT AT ) - Abnormal; Notable for the following components:   D-Dimer, Quant 0.58 (*)    All other components within normal limits  RESP PANEL BY RT-PCR (RSV, FLU A&B, COVID)  RVPGX2  HCG, SERUM, QUALITATIVE  TSH  T4, FREE  PROCALCITONIN  TROPONIN T, HIGH SENSITIVITY  TROPONIN T, HIGH SENSITIVITY    EKG: EKG Interpretation Date/Time:  Monday June 12 2024 06:53:06 EST Ventricular Rate:  112 PR Interval:  197 QRS Duration:  81 QT Interval:  342 QTC Calculation: 467 R Axis:   24  Text Interpretation: Sinus tachycardia Prominent P waves, nondiagnostic Probable anterior infarct, age indeterminate Lateral leads are also involved Confirmed by Mannie Pac [phone]) on 06/12/2024 6:58:37 AM  Radiology: CT Angio Chest PE W  and/or Wo Contrast Result Date: 06/12/2024 CLINICAL DATA:  Shortness of breath and cough. EXAM: CT ANGIOGRAPHY CHEST WITH CONTRAST TECHNIQUE: Multidetector CT imaging of the chest was performed using the standard protocol during bolus administration of intravenous contrast. Multiplanar CT image reconstructions and MIPs were obtained to evaluate the vascular anatomy. RADIATION DOSE REDUCTION: This exam was performed according to the departmental dose-optimization program which includes automated exposure control, adjustment of the mA and/or kV according to patient size and/or  use of iterative reconstruction technique. CONTRAST:  75mL OMNIPAQUE  IOHEXOL  350 MG/ML SOLN COMPARISON:  High-resolution chest CT 05/07/2020 FINDINGS: Cardiovascular: The heart is enlarged. No substantial pericardial effusion. No thoracic aortic aneurysm. There is no filling defect within the opacified pulmonary arteries to suggest the presence of an acute pulmonary embolus. Assessment of subsegmental pulmonary arteries to the lower lobes bilaterally is degraded by bolus timing and breathing motion. Mediastinum/Nodes: No mediastinal lymphadenopathy. There is no hilar lymphadenopathy. The esophagus has normal imaging features. There is no axillary lymphadenopathy. Lungs/Pleura: Patchy and nodular airspace disease is seen in both upper lungs with some peripheral sparing. Similar but less advanced patchy and nodular airspace disease is seen in the right middle and both lower lobes. No substantial pleural effusion. Upper Abdomen: Calcified gallstones evident. Sequelae of gastric bypass. Musculoskeletal: No worrisome lytic or sclerotic osseous abnormality. Review of the MIP images confirms the above findings. IMPRESSION: 1. No CT evidence for acute pulmonary embolus. Assessment of subsegmental pulmonary arteries to the lower lobes bilaterally is degraded by bolus timing and breathing motion. 2. Patchy and nodular airspace disease in both upper lungs  with some peripheral sparing. Similar but less advanced patchy and nodular airspace disease is seen in the right middle and both lower lobes. Imaging features are compatible with multifocal pneumonia. While metastatic disease could have this appearance, patient age and disease distribution favors infectious/inflammatory etiology 3. Cholelithiasis. Electronically Signed   By: Camellia Candle M.D.   On: 06/12/2024 11:42   DG Chest 2 View Result Date: 06/12/2024 CLINICAL DATA:  Shortness of breath beginning yesterday. EXAM: CHEST - 2 VIEW COMPARISON:  04/10/2020 FINDINGS: Stable mild cardiomegaly. New heterogeneous asymmetric airspace disease is seen, involving right lung greater than left, suspicious for edema or less likely infection. No pleural effusion. IMPRESSION: New heterogeneous asymmetric airspace disease, right lung greater than left, suspicious for edema or less likely infection. Electronically Signed   By: Norleen DELENA Kil M.D.   On: 06/12/2024 08:24     Procedures   Medications Ordered in the ED  cefTRIAXone  (ROCEPHIN ) 1 g in sodium chloride  0.9 % 100 mL IVPB (1 g Intravenous New Bag/Given 06/12/24 1208)  azithromycin  (ZITHROMAX ) 500 mg in sodium chloride  0.9 % 250 mL IVPB (has no administration in time range)  azithromycin  (ZITHROMAX ) 500 mg in sodium chloride  0.9 % 250 mL IVPB (has no administration in time range)  cefTRIAXone  (ROCEPHIN ) 1 g in sodium chloride  0.9 % 100 mL IVPB (has no administration in time range)  ipratropium-albuterol  (DUONEB) 0.5-2.5 (3) MG/3ML nebulizer solution 3 mL (3 mLs Nebulization Given 06/12/24 0808)  nebivolol  (BYSTOLIC ) tablet 10 mg (10 mg Oral Given 06/12/24 0857)  furosemide  (LASIX ) injection 40 mg (40 mg Intravenous Given 06/12/24 1050)  iohexol  (OMNIPAQUE ) 350 MG/ML injection 75 mL (75 mLs Intravenous Contrast Given 06/12/24 1032)  nitroGLYCERIN  (NITROGLYN) 2 % ointment 0.5 inch (0.5 inches Topical Given 06/12/24 1116)                                     Medical Decision Making Amount and/or Complexity of Data Reviewed Labs: ordered. Radiology: ordered.  Risk Prescription drug management. Decision regarding hospitalization.   This patient presents to the ED for concern of shortness of breath, this involves an extensive number of treatment options, and is a complaint that carries with it a high risk of complications and morbidity.  The differential diagnosis includes pneumonia, bronchitis, PE, ACS, CHF  Co morbidities that complicate the patient evaluation  Asthmatic bronchitis, iron  deficiency anemia, obesity, hypertension, transient eczema, psoriasis   Additional history obtained:  Additional history obtained from chart review   Lab Tests:  I Ordered, and personally interpreted labs.  The pertinent results include: CBC downtrending hemoglobin down to 7.5, BMP unremarkable with exception of mild hypocalcemia at 8.8, respiratory panel negative for COVID-19, points, RSV, hCG negative, proBNP elevated at 1508, D-dimer slightly elevated at 0.58, troponin initially negative at less than 15, TSH unremarkable at 1.92, free T4 pending   Imaging Studies ordered:  I ordered imaging studies including chest x-ray, CT angio chest I independently visualized and interpreted imaging which showed New heterogeneous asymmetric airspace disease, right lung greater than left, suspicious for edema or less likely infection.No CT evidence for acute pulmonary embolus. Assessment of subsegmental pulmonary arteries to the lower lobes bilaterally is degraded by bolus timing and breathing motion. 2. Patchy and nodular airspace disease in both upper lungs with some peripheral sparing. Similar but less advanced patchy and nodular airspace disease is seen in the right middle and both lower lobes. Imaging features are compatible with multifocal pneumonia. While metastatic disease could have this appearance, patient age and disease distribution favors  infectious/inflammatory etiology 3. Cholelithiasis. I agree with the radiologist interpretation   Cardiac Monitoring: / EKG:  The patient was maintained on a cardiac monitor.  I personally viewed and interpreted the cardiac monitored which showed an underlying rhythm of: Sinus tachycardia   Consultations Obtained:  I requested consultation with the hospitalist,  and discussed lab and imaging findings as well as pertinent plan - they recommend: Spoke with Dr. Roxane, Hospitalist, will be admitting patient.   Problem List / ED Course / Critical interventions / Medication management  Patient with past history significant for obesity, iron  deficiency anemia, asthmatic bronchitis, transient eczema, psoriasis presents ED with concerns of shortness of breath.  Reports that this has been ongoing for the last 2 days or so.  Has tried using home albuterol  without significant proved in symptoms.  No history of PE or DVT.  Not on blood thinners.  Denies any hemoptysis. Physical exam reveals rales to the right upper lung field but no other abnormal findings.  Trace edema in bilateral lower extremities patient reports this is somewhat her baseline.  based on her findings, PE is not fully excluded.  Will add on D-dimer.  EKG is thankfully reassuring but will workup for possible underlying source of her current shortness of breath will also treat symptomatically for possible asthmatic bronchitis. Patient's chest x-ray shows asymmetric airspace disease with right greater than left.  I do not change her chest given slightly elevated D-dimer which did not show signs of PE but did finding patchy nodular airspace disease to both the upper lungs and with peripheral sparing.  Could represent multifocal pneumonia although metastatic disease is not fully excluded but given patient's age and lack of risk factors, less likely infectious inflammatory conditions more likely. Based on workup,  patient given 40 mg of Lasix  as  she is Lasix  nave.  Also started on Rocephin  and Zithromax .  Will consult hospitalist for admission. Spoke with Dr. Layvonne, hospitalist, who admitting patient. I ordered medication including DuoNeb, Lasix , nitroglycerin , Rocephin , azithromycin  for shortness of breath, CHF, multifocal pneumonia Reevaluation of the patient after these medicines showed that the patient stayed the same I have reviewed the patients home medicines and have made adjustments as needed   Social Determinants of Health:  None  Test / Admission - Considered:  Patient requiring admission.  Final diagnoses:  Acute congestive heart failure, unspecified heart failure type [facility])  Multifocal pneumonia  Acute hypoxic respiratory failure [facility])    ED Discharge Orders     None          Cecily Legrand LABOR, PA-C 06/12/24 1224    Mannie Pac T, DO 06/13/24 [phone]

## 2024-06-12 NOTE — ED Notes (Signed)
 Purewick did not work for patient so bedside commode was obtained.

## 2024-06-12 NOTE — ED Notes (Signed)
 Patient room air sat 84% placed on oxygen  at 2L [place], sat 94%.

## 2024-06-12 NOTE — ED Notes (Signed)
 Patient has small lesions all over body noted on arrival.  Recedived approval for her to take her home medication for itching.

## 2024-06-12 NOTE — H&P (Signed)
 History and Physical  [name] FMW:[id number] DOB: [birth year] DOA: 06/12/2024  PCP: Joshua Debby CROME, MD   Chief Complaint: Shortness of breath  HPI: [name] is a 38 y.o. female with medical history significant for plaque psoriasis, generalized anxiety, morbid obesity, heart failure with preserved EF being admitted to the hospital with 2 days of cough and shortness of breath concerning for acute on chronic heart failure with preserved EF.  History is provided by the patient, who states that she was in her usual state of health until a couple days ago, when she started having nonproductive cough, shortness of breath which is worsened with exertion.  Denies any sick contacts, fevers, chest pain.  She sometimes gets lower extremity edema when her psoriasis flares up, however a flare has recently ended, so her leg swelling has improved.  She denies any orthopnea.    Review of Systems: Please see HPI for pertinent positives and negatives. A complete 10 system review of systems are otherwise negative.  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     Past Surgical History:  Procedure Laterality Date   BREATH SHIRLEAN VEAR LORA  02/09/2012   Procedure: BREATH TEK VEAR LORA;  Surgeon: Donnice KATHEE Lunger, MD;  Location: [facility];  Service: General;  Laterality: N/A;   GASTRIC BYPASS  08/29/2012   HERNIA REPAIR  2014   Social History:  reports that she has never smoked. She has never been exposed to tobacco smoke. She has never used smokeless tobacco. She reports that she does not drink alcohol and  does not use drugs.  Allergies  Allergen Reactions   Lyrica  [Pregabalin ] Swelling, Rash and Other (See Comments)    Swelling is in the feet    Family History  Problem Relation Age of Onset   Breast cancer Maternal Grandmother 40   Hypertension Father    Hypertension Mother    Asthma Brother    Alcohol abuse Neg Hx    Arthritis Neg Hx    Cancer Neg Hx    Early death Neg Hx    Heart disease Neg Hx    Hyperlipidemia Neg Hx    Kidney disease Neg Hx    Stroke Neg Hx      Prior to Admission medications   Medication Sig Start Date End Date Taking? Authorizing Provider  DULoxetine  (CYMBALTA ) 20 MG capsule Take 20 mg by mouth daily.    [provider]  Ferric Maltol  (ACCRUFER ) 30 MG CAPS Take 1 capsule (30 mg total) by mouth in the morning and at bedtime. 02/03/23   Joshua Debby CROME, MD  folic acid  (FOLVITE ) 1 MG tablet Take 1 tablet (1 mg total) by mouth daily. 07/05/23   Joshua Debby CROME, MD  HYDROcodone  Bitartrate ER 15 MG CP12 Take 1 capsule by mouth 2 (two) times daily as needed. 10/25/23   Joshua Debby CROME, MD  levalbuterol  (XOPENEX  HFA) 45 MCG/ACT inhaler INHALE 2 PUFFS BY MOUTH INTO THE LUNGS EVERY 6 HOURS AS NEEDED FOR WHEEZE. PATIENT WILL NEED TO FOLLOW UP WITH PCP FOR  FUTURE REFILLS 03/14/24   Joshua Debby CROME, MD  nebivolol  (BYSTOLIC ) 10 MG tablet TAKE 1 TABLET BY MOUTH EVERY DAY 08/09/23   Joshua Debby CROME, MD  potassium chloride  (KLOR-CON  M10) 10 MEQ tablet Take 1 tablet (10 mEq total) by mouth 2 (two) times daily. 02/03/23   Joshua Debby CROME, MD  prazosin (MINIPRESS) 2 MG capsule Take 2 mg by mouth at bedtime. 07/27/23   [provider]  spironolactone  (ALDACTONE ) 50 MG tablet Take 1 tablet (50 mg total) by mouth daily. 07/05/23   Joshua Debby CROME, MD  traZODone  (DESYREL ) 100 MG tablet Take 1 tablet (100 mg total) by mouth at bedtime as needed for sleep. 07/08/23   Joshua Debby CROME, MD  Vitamin D , Ergocalciferol , (DRISDOL ) 1.25 MG (50000 UNIT) CAPS capsule Take 1 capsule  (50,000 Units total) by mouth every 7 (seven) days. 09/21/23   Joshua Debby CROME, MD  zinc  gluconate 50 MG tablet Take 1 tablet (50 mg total) by mouth daily. 07/05/23   Joshua Debby CROME, MD  FLUoxetine  (PROZAC ) 10 MG tablet Take 1 tablet (10 mg total) by mouth daily. Patient not taking: Reported on 02/01/2019 03/17/18 02/01/19  Joshua Debby CROME, MD  phentermine  37.5 MG capsule Take 1 capsule (37.5 mg total) by mouth every morning. Patient not taking: Reported on 02/01/2019 10/20/18 02/01/19  Joshua Debby CROME, MD    Physical Exam: BP (!) 189/98   Pulse (!) 108   Temp 99.1 F (37.3 C) (Oral)   Resp (!) 22   Ht 5' 9 (1.753 m)   Wt (!) 138.3 kg   LMP 05/13/2024 (Approximate)   SpO2 96%   BMI 45.04 kg/m  General:  Alert, oriented, calm, in no acute distress, wearing 2 L nasal cannula oxygen .  Morbidly obese, speaking in full sentences, no cough. Cardiovascular: RRR, no murmurs or rubs, no peripheral edema  Respiratory: Breath sounds are distant, but clear to auscultation bilaterally in the upper lung zones, with some faint crackles at the bilateral bases.  No tachypnea, wheezing or rhonchi. Abdomen: soft, nontender, nondistended, normal bowel tones heard  Skin: dry, healing areas of plaque psoriasis, no open draining wounds, or evidence of cellulitis Musculoskeletal: no joint effusions, normal range of motion  Psychiatric: appropriate affect, normal speech  Neurologic: extraocular muscles intact, clear speech, moving all extremities with intact sensorium         Labs on Admission:  Basic Metabolic Panel: Recent Labs  Lab 06/12/24 0745  NA 138  K 3.9  CL 100  CO2 25  GLUCOSE 102*  BUN 9  CREATININE 0.55  CALCIUM 8.8*   Liver Function Tests: No results for input(s): AST, ALT, ALKPHOS, BILITOT, PROT, ALBUMIN in the last 168 hours. No results for input(s): LIPASE, AMYLASE in the last 168 hours. No results for input(s): AMMONIA in the last 168 hours. CBC: Recent Labs   Lab 06/12/24 0745  WBC 7.9  HGB 7.5*  HCT 26.9*  MCV 81.3  PLT 209   Cardiac Enzymes: No results for input(s): CKTOTAL, CKMB, CKMBINDEX, TROPONINI in the last 168 hours. BNP (last 3 results) No results for input(s): BNP in the last 8760 hours.  ProBNP (last 3 results) Recent Labs    06/12/24 0745  PROBNP 1,508.0*    CBG: No results for input(s): GLUCAP in the last 168 hours.  Radiological Exams on Admission: CT Angio Chest PE W and/or Wo Contrast Result Date: 06/12/2024 CLINICAL DATA:  Shortness of breath and cough. EXAM: CT ANGIOGRAPHY CHEST WITH CONTRAST  TECHNIQUE: Multidetector CT imaging of the chest was performed using the standard protocol during bolus administration of intravenous contrast. Multiplanar CT image reconstructions and MIPs were obtained to evaluate the vascular anatomy. RADIATION DOSE REDUCTION: This exam was performed according to the departmental dose-optimization program which includes automated exposure control, adjustment of the mA and/or kV according to patient size and/or use of iterative reconstruction technique. CONTRAST:  75mL OMNIPAQUE  IOHEXOL  350 MG/ML SOLN COMPARISON:  High-resolution chest CT 05/07/2020 FINDINGS: Cardiovascular: The heart is enlarged. No substantial pericardial effusion. No thoracic aortic aneurysm. There is no filling defect within the opacified pulmonary arteries to suggest the presence of an acute pulmonary embolus. Assessment of subsegmental pulmonary arteries to the lower lobes bilaterally is degraded by bolus timing and breathing motion. Mediastinum/Nodes: No mediastinal lymphadenopathy. There is no hilar lymphadenopathy. The esophagus has normal imaging features. There is no axillary lymphadenopathy. Lungs/Pleura: Patchy and nodular airspace disease is seen in both upper lungs with some peripheral sparing. Similar but less advanced patchy and nodular airspace disease is seen in the right middle and both lower lobes. No  substantial pleural effusion. Upper Abdomen: Calcified gallstones evident. Sequelae of gastric bypass. Musculoskeletal: No worrisome lytic or sclerotic osseous abnormality. Review of the MIP images confirms the above findings. IMPRESSION: 1. No CT evidence for acute pulmonary embolus. Assessment of subsegmental pulmonary arteries to the lower lobes bilaterally is degraded by bolus timing and breathing motion. 2. Patchy and nodular airspace disease in both upper lungs with some peripheral sparing. Similar but less advanced patchy and nodular airspace disease is seen in the right middle and both lower lobes. Imaging features are compatible with multifocal pneumonia. While metastatic disease could have this appearance, patient age and disease distribution favors infectious/inflammatory etiology 3. Cholelithiasis. Electronically Signed   By: Camellia Candle M.D.   On: 06/12/2024 11:42   DG Chest 2 View Result Date: 06/12/2024 CLINICAL DATA:  Shortness of breath beginning yesterday. EXAM: CHEST - 2 VIEW COMPARISON:  04/10/2020 FINDINGS: Stable mild cardiomegaly. New heterogeneous asymmetric airspace disease is seen, involving right lung greater than left, suspicious for edema or less likely infection. No pleural effusion. IMPRESSION: New heterogeneous asymmetric airspace disease, right lung greater than left, suspicious for edema or less likely infection. Electronically Signed   By: Norleen DELENA Kil M.D.   On: 06/12/2024 08:24   Assessment/Plan [name] is a 38 y.o. female with medical history significant for plaque psoriasis, generalized anxiety, morbid obesity, heart failure with preserved EF being admitted to the hospital with 2 days of cough and shortness of breath concerning for acute on chronic heart failure with preserved EF.    Acute hypoxic respiratory failure-suspected likely due to acute diastolic congestive heart failure, possible contribution of early pneumonia.  Note viral respiratory panel  is negative. -Inpatient admission -Monitor on telemetry -Supplemental oxygen , wean as tolerated -Treat potential heart failure versus pneumonia as below  Acute on chronic heart failure with preserved EF-patient had echo September 2024 which showed grade 1 diastolic dysfunction.  Not currently on diuretics.  Presents with progressive dyspnea with exertion, elevated BNP.  She received 40 mg IV Lasix  x 1 in the ER. -Monitor on telemetry -Heart healthy diet -Update 2D echo -Monitor for clinical response, and plan to redose diuretic in the morning as indicated  Community-acquired pneumonia-suspected due to cough, though patient has no fever or leukocytosis.  CT findings as above with possible early infection. -Check procalcitonin -Continue empiric IV azithromycin  and Rocephin  for the time being  Chronic  anemia-unclear etiology, patient denies any recent blood loss.  She does have a history of heavy menses in the past, but no longer.  Possible anemia of chronic disease given her history of vasculitis and psoriasis. -Check anemia panel -Follow hemoglobin with daily labs  DVT prophylaxis: Lovenox      Code Status: Full Code  Consults called: None  Admission status: The appropriate patient status for this patient is INPATIENT. Inpatient status is judged to be reasonable and necessary in order to provide the required intensity of service to ensure the patient's safety. The patient's presenting symptoms, physical exam findings, and initial radiographic and laboratory data in the context of their chronic comorbidities is felt to place them at high risk for further clinical deterioration. Furthermore, it is not anticipated that the patient will be medically stable for discharge from the hospital within 2 midnights of admission.    I certify that at the point of admission it is my clinical judgment that the patient will require inpatient hospital care spanning beyond 2 midnights from the point of  admission due to high intensity of service, high risk for further deterioration and high frequency of surveillance required  Time spent: 56 minutes  [name] CHRISTELLA Gail MD [facility] Pager [phone]  If 7PM-7AM, please contact night-coverage [email or web address] Password [facility]  06/12/2024, 12:55 PM

## 2024-06-13 LAB — CBC
HCT: 24.9 % — ABNORMAL LOW (ref 36.0–46.0)
Hemoglobin: 7 g/dL — ABNORMAL LOW (ref 12.0–15.0)
MCH: 22.6 pg — ABNORMAL LOW (ref 26.0–34.0)
MCHC: 28.1 g/dL — ABNORMAL LOW (ref 30.0–36.0)
MCV: 80.3 fL (ref 80.0–100.0)
Platelets: 176 K/uL (ref 150–400)
RBC: 3.1 MIL/uL — ABNORMAL LOW (ref 3.87–5.11)
RDW: 25.5 % — ABNORMAL HIGH (ref 11.5–15.5)
WBC: 8.4 K/uL (ref 4.0–10.5)
nRBC: 0 % (ref 0.0–0.2)

## 2024-06-13 LAB — BASIC METABOLIC PANEL WITH GFR
Anion gap: 10 (ref 5–15)
BUN: 8 mg/dL (ref 6–20)
CO2: 27 mmol/L (ref 22–32)
Calcium: 8.9 mg/dL (ref 8.9–10.3)
Chloride: 101 mmol/L (ref 98–111)
Creatinine, Ser: 0.58 mg/dL (ref 0.44–1.00)
GFR, Estimated: >60 mL/min (ref >60)
Glucose, Bld: 93 mg/dL (ref 70–99)
Potassium: 3.3 mmol/L — ABNORMAL LOW (ref 3.5–5.1)
Sodium: 138 mmol/L (ref 135–145)

## 2024-06-13 MED ORDER — IPRATROPIUM-ALBUTEROL 0.5-2.5 (3) MG/3ML IN SOLN
3.0000 mL | Freq: Four times a day (QID) | RESPIRATORY_TRACT | Status: DC
  Administered 2024-06-13 – 2024-06-15 (×8): 3 mL via RESPIRATORY_TRACT
  Filled 2024-06-13 (×8): qty 3

## 2024-06-13 MED ORDER — FUROSEMIDE 10 MG/ML IJ SOLN
40.0000 mg | Freq: Once | INTRAMUSCULAR | Status: AC
  Administered 2024-06-13: 40 mg via INTRAVENOUS
  Filled 2024-06-13: qty 4

## 2024-06-13 MED ORDER — SODIUM CHLORIDE 0.9 % IV SOLN
2.0000 g | INTRAVENOUS | Status: AC
  Administered 2024-06-13 – 2024-06-16 (×4): 2 g via INTRAVENOUS
  Filled 2024-06-13 (×4): qty 20

## 2024-06-13 MED ORDER — CYANOCOBALAMIN 1000 MCG/ML IJ SOLN
1000.0000 ug | Freq: Once | INTRAMUSCULAR | Status: AC
  Administered 2024-06-13: 1000 ug via INTRAMUSCULAR
  Filled 2024-06-13: qty 1

## 2024-06-13 MED ORDER — POTASSIUM CHLORIDE CRYS ER 20 MEQ PO TBCR
40.0000 meq | EXTENDED_RELEASE_TABLET | Freq: Once | ORAL | Status: AC
  Administered 2024-06-13: 40 meq via ORAL
  Filled 2024-06-13: qty 2

## 2024-06-13 MED ORDER — DUPILUMAB 300 MG/2ML ~~LOC~~ SOAJ
300.0000 mg | SUBCUTANEOUS | Status: DC
  Administered 2024-06-13: 300 mg via SUBCUTANEOUS
  Filled 2024-06-13: qty 2

## 2024-06-13 MED ORDER — GUAIFENESIN ER 600 MG PO TB12
600.0000 mg | ORAL_TABLET | Freq: Two times a day (BID) | ORAL | Status: DC
  Administered 2024-06-13 – 2024-06-14 (×3): 600 mg via ORAL
  Filled 2024-06-13 (×3): qty 1

## 2024-06-13 MED ORDER — VITAMIN B-12 1000 MCG PO TABS
1000.0000 ug | ORAL_TABLET | Freq: Every day | ORAL | Status: DC
  Administered 2024-06-14 – 2024-06-17 (×4): 1000 ug via ORAL
  Filled 2024-06-13 (×4): qty 1

## 2024-06-13 MED ORDER — HYDRALAZINE HCL 20 MG/ML IJ SOLN
10.0000 mg | INTRAMUSCULAR | Status: DC | PRN
  Administered 2024-06-13: 10 mg via INTRAVENOUS
  Filled 2024-06-13: qty 1

## 2024-06-13 MED ORDER — NEBIVOLOL HCL 10 MG PO TABS
10.0000 mg | ORAL_TABLET | Freq: Every day | ORAL | Status: DC
  Administered 2024-06-13 – 2024-06-17 (×5): 10 mg via ORAL
  Filled 2024-06-13 (×5): qty 1

## 2024-06-13 MED ORDER — CAMPHOR-MENTHOL 0.5-0.5 % EX LOTN
TOPICAL_LOTION | CUTANEOUS | Status: DC | PRN
  Filled 2024-06-13: qty 222

## 2024-06-13 NOTE — Progress Notes (Signed)
 PROGRESS NOTE  [name]  FMW:[id number] DOB: Nov 25, 1985 DOA: 06/12/2024 PCP: Joshua Debby CROME, MD   Brief Narrative: Patient is a 38 year old female with history of plaque psoriasis, generalized anxiety, morbid obesity, HFpEF, chronic normocytic anemia who presented with 2-day history of cough, shortness of breath.  Required 2 to 3 L of oxygen  per minute on presentation.  CT angiogram did not show any PE but showed patchy and nodular airspace disease in both upper lungs with some peripheral sparing consistent with multifocal pneumonia.  Patient was started on antibiotics.  Clinically improving  Assessment & Plan:  Active Problems:   Acute respiratory failure with hypoxia (HCC)   Acute hypoxic respiratory failure: Presented with shortness of breath, cough.  Requiring 2 to 3 L of oxygen  per minute.  Not on oxygen  at home.  Likely from pneumonia.  Continue to wean oxygen .  Continue bronchodilators as needed.  Continue current medications  Acute on chronic HFpEF: Elevated BNP on presentation.  2D echo showed EF more than 75%, no regional wall motion abnormality, mild left ventricular hypertrophy, grade 1 left ventricular diastolic dysfunction.  Given a dose of Lasix  IV 40 mg once. Will monitor volume status.  Will give another dose of IV Lasix .  Community-acquired pneumonia: No fever or leukocytosis.  CT imaging consistent with multifocal pneumonia.  Continue azithromycin , ceftriaxone .  Afebrile ,no leukocytosis  Chronic normocytic anemia/vitamin B12 deficiency: History of menorrhagia in the past.  Hemoglobin in the range of 7.  Iron  panel optimal.  But showed vitamin B12 of  243.  Continue supplementation.  Continue monitoring.  Hypertension: Takes Bystolic  at home.  Restarted.  Hypertensive this morning.  We might need to add another medication if needed.  Hypokalemia: Supplemented with potassium  Morbid obesity: BMI of 52.7  History of psoriasis: Follows with dermatologist.  On  dupilumab       DVT prophylaxis:enoxaparin  (LOVENOX ) injection 40 mg Start: 06/12/24 2200     Code Status: Full Code  Family Communication: Mother at bedside  Patient status: Inpatient  Patient is from : Home  Anticipated discharge to: Home  Estimated DC date: 1 to 2 days   Consultants: None  Procedures: None  Antimicrobials:  Anti-infectives (From admission, onward)    Start     Dose/Rate Route Frequency Ordered Stop   06/13/24 1200  cefTRIAXone  (ROCEPHIN ) 1 g in sodium chloride  0.9 % 100 mL IVPB        1 g 200 mL/hr over 30 Minutes Intravenous Every 24 hours 06/12/24 1221 06/17/24 1159   06/13/24 1000  azithromycin  (ZITHROMAX ) 500 mg in sodium chloride  0.9 % 250 mL IVPB        500 mg 250 mL/hr over 60 Minutes Intravenous Every 24 hours 06/12/24 1221 06/17/24 0959   06/12/24 1200  cefTRIAXone  (ROCEPHIN ) 1 g in sodium chloride  0.9 % 100 mL IVPB        1 g 200 mL/hr over 30 Minutes Intravenous  Once 06/12/24 1153 06/12/24 1255   06/12/24 1200  azithromycin  (ZITHROMAX ) 500 mg in sodium chloride  0.9 % 250 mL IVPB        500 mg 250 mL/hr over 60 Minutes Intravenous  Once 06/12/24 1153 06/13/24 0740       Subjective: Patient seen and examined at bedside today.  Overall comfortable.  Lying on bed.  On 2 to 3 L of oxygen  per minute.  Denies worsening shortness of breath or cough.  Still has wet cough.  Slightly sleepy/drowsy but not in respiratory distress.  Mother at bedside  Objective: Vitals:   06/12/24 2358 06/13/24 0140 06/13/24 0141 06/13/24 0402  BP: (!) 167/111 (!) 173/89  (!) 158/98  Pulse: (!) 109   95  Resp: (!) 28 (!) 22 19 (!) 22  Temp: 98.8 F (37.1 C)   98 F (36.7 C)  TempSrc: Oral   Oral  SpO2: 95%   98%  Weight:      Height:        Intake/Output Summary (Last 24 hours) at 06/13/2024 0754 Last data filed at 06/12/2024 1459 Gross per 24 hour  Intake 271.24 ml  Output --  Net 271.24 ml   Filed Weights   06/12/24 0651 06/12/24 1418  Weight:  (!) 138.3 kg (!) 162 kg    Examination:  General exam: Overall comfortable, not in distress, morbidly obese HEENT: PERRL Respiratory system: Bilateral scattered rhonchi Cardiovascular system: S1 & S2 heard, RRR.  Gastrointestinal system: Abdomen is nondistended, soft and nontender. Central nervous system: Alert and oriented Extremities: No edema, no clubbing ,no cyanosis Skin: Scattered psoriatic lesions,no icterus     Data Reviewed: I have personally reviewed following labs and imaging studies  CBC: Recent Labs  Lab 06/12/24 0745 06/13/24 0414  WBC 7.9 8.4  HGB 7.5* 7.0*  HCT 26.9* 24.9*  MCV 81.3 80.3  PLT 209 176   Basic Metabolic Panel: Recent Labs  Lab 06/12/24 0745 06/13/24 0414  NA 138 138  K 3.9 3.3*  CL 100 101  CO2 25 27  GLUCOSE 102* 93  BUN 9 8  CREATININE 0.55 0.58  CALCIUM 8.8* 8.9     Recent Results (from the past 240 hours)  Resp panel by RT-PCR (RSV, Flu A&B, Covid) Anterior Nasal Swab     Status: None   Collection Time: 06/12/24  6:53 AM   Specimen: Anterior Nasal Swab  Result Value Ref Range Status   SARS Coronavirus 2 by RT PCR NEGATIVE NEGATIVE Final    Comment: (NOTE) SARS-CoV-2 target nucleic acids are NOT DETECTED.  The SARS-CoV-2 RNA is generally detectable in upper respiratory specimens during the acute phase of infection. The lowest concentration of SARS-CoV-2 viral copies this assay can detect is 138 copies/mL. A negative result does not preclude SARS-Cov-2 infection and should not be used as the sole basis for treatment or other patient management decisions. A negative result may occur with  improper specimen collection/handling, submission of specimen other than nasopharyngeal swab, presence of viral mutation(s) within the areas targeted by this assay, and inadequate number of viral copies(<138 copies/mL). A negative result must be combined with clinical observations, patient history, and epidemiological information. The  expected result is Negative.  Fact Sheet for Patients:  bloggercourse.com  Fact Sheet for Healthcare Providers:  seriousbroker.it  This test is no t yet approved or cleared by the [place]  FDA and  has been authorized for detection and/or diagnosis of SARS-CoV-2 by FDA under an Emergency Use Authorization (EUA). This EUA will remain  in effect (meaning this test can be used) for the duration of the COVID-19 declaration under Section 564(b)(1) of the Act, 21 U.S.C.section 360bbb-3(b)(1), unless the authorization is terminated  or revoked sooner.       Influenza A by PCR NEGATIVE NEGATIVE Final   Influenza B by PCR NEGATIVE NEGATIVE Final    Comment: (NOTE) The Xpert Xpress SARS-CoV-2/FLU/RSV plus assay is intended as an aid in the diagnosis of influenza from Nasopharyngeal swab specimens and should not be used as a sole basis for treatment.  Nasal washings and aspirates are unacceptable for Xpert Xpress SARS-CoV-2/FLU/RSV testing.  Fact Sheet for Patients: bloggercourse.com  Fact Sheet for Healthcare Providers: seriousbroker.it  This test is not yet approved or cleared by the [place]  FDA and has been authorized for detection and/or diagnosis of SARS-CoV-2 by FDA under an Emergency Use Authorization (EUA). This EUA will remain in effect (meaning this test can be used) for the duration of the COVID-19 declaration under Section 564(b)(1) of the Act, 21 U.S.C. section 360bbb-3(b)(1), unless the authorization is terminated or revoked.     Resp Syncytial Virus by PCR NEGATIVE NEGATIVE Final    Comment: (NOTE) Fact Sheet for Patients: bloggercourse.com  Fact Sheet for Healthcare Providers: seriousbroker.it  This test is not yet approved or cleared by the [place]  FDA and has been authorized for detection and/or  diagnosis of SARS-CoV-2 by FDA under an Emergency Use Authorization (EUA). This EUA will remain in effect (meaning this test can be used) for the duration of the COVID-19 declaration under Section 564(b)(1) of the Act, 21 U.S.C. section 360bbb-3(b)(1), unless the authorization is terminated or revoked.  Performed at [facility], [address], [place], [place] [address]      Radiology Studies: ECHOCARDIOGRAM COMPLETE Result Date: 06/12/2024    ECHOCARDIOGRAM REPORT   Patient Name:   [name] [facility] Date of Exam: 06/12/2024 Medical Rec #:  [id number]             Height:       69.0 in Accession #:    [id number]            Weight:       357.1 lb Date of Birth:  1985/08/28              BSA:          2.643 m Patient Age:    38 years              BP:           189/98 mmHg Patient Gender: F                     HR:           93 bpm. Exam Location:  Inpatient Procedure: 2D Echo, Cardiac Doppler, Color Doppler and Intracardiac            Opacification Agent (Both Spectral and Color Flow Doppler were            utilized during procedure). Indications:    CHF- Acute Systolic I50.21  History:        Patient has prior history of Echocardiogram examinations, most                 recent 03/24/2023. Risk Factors:Hypertension and Sleep Apnea.  Sonographer:    Koleen Popper RDCS Referring Phys: [id number] MIR [facility]  Sonographer Comments: Patient is obese. Image acquisition challenging due to patient body habitus. IMPRESSIONS  1. Left ventricular ejection fraction, by estimation, is >75%. Left ventricular ejection fraction by PLAX is 75 %. The left ventricle has hyperdynamic function. The left ventricle has no regional wall motion abnormalities. There is mild left ventricular  hypertrophy. Left ventricular diastolic parameters are consistent with Grade I diastolic dysfunction (impaired relaxation).  2. Right ventricular systolic function is normal. The right ventricular size is normal.  3. The  mitral valve is normal in structure. No evidence of mitral valve regurgitation.  4. The aortic valve is tricuspid.  Aortic valve regurgitation is not visualized. No aortic stenosis is present.  5. Aortic dilatation noted. There is mild dilatation of the ascending aorta, measuring 40 mm.  6. The inferior vena cava is normal in size with greater than 50% respiratory variability, suggesting right atrial pressure of 3 mmHg. Comparison(s): Changes from prior study are noted. 03/24/2023: LVEF 60-65%. FINDINGS  Left Ventricle: Left ventricular ejection fraction, by estimation, is >75%. Left ventricular ejection fraction by PLAX is 75 %. The left ventricle has hyperdynamic function. The left ventricle has no regional wall motion abnormalities. Definity  contrast  agent was given IV to delineate the left ventricular endocardial borders. The left ventricular internal cavity size was normal in size. There is mild left ventricular hypertrophy. Left ventricular diastolic parameters are consistent with Grade I diastolic dysfunction (impaired relaxation). Indeterminate filling pressures. Right Ventricle: The right ventricular size is normal. No increase in right ventricular wall thickness. Right ventricular systolic function is normal. Left Atrium: Left atrial size was normal in size. Right Atrium: Right atrial size was normal in size. Pericardium: There is no evidence of pericardial effusion. Mitral Valve: The mitral valve is normal in structure. No evidence of mitral valve regurgitation. Tricuspid Valve: The tricuspid valve is grossly normal. Tricuspid valve regurgitation is trivial. Aortic Valve: The aortic valve is tricuspid. Aortic valve regurgitation is not visualized. No aortic stenosis is present. Pulmonic Valve: The pulmonic valve was not well visualized. Pulmonic valve regurgitation is not visualized. Aorta: Aortic dilatation noted. There is mild dilatation of the ascending aorta, measuring 40 mm. Venous: The inferior vena  cava is normal in size with greater than 50% respiratory variability, suggesting right atrial pressure of 3 mmHg. IAS/Shunts: No atrial level shunt detected by color flow Doppler.  LEFT VENTRICLE PLAX 2D LV EF:         Left            Diastology                ventricular     LV e' medial:    9.68 cm/s                ejection        LV E/e' medial:  9.7                fraction by     LV e' lateral:   10.20 cm/s                PLAX is 75      LV E/e' lateral: 9.2                %. LVIDd:         5.90 cm LVIDs:         3.30 cm LV PW:         1.20 cm LV IVS:        1.20 cm LVOT diam:     2.30 cm LV SV:         100 LV SV Index:   38 LVOT Area:     4.15 cm  RIGHT VENTRICLE             IVC RV S prime:     18.30 cm/s  IVC diam: 2.00 cm TAPSE (M-mode): 2.4 cm LEFT ATRIUM             Index LA diam:        4.20 cm 1.59 cm/m LA Vol (A2C):   66.3 ml  25.08 ml/m LA Vol (A4C):   68.0 ml 25.73 ml/m LA Biplane Vol: 67.2 ml 25.42 ml/m  AORTIC VALVE LVOT Vmax:   134.00 cm/s LVOT Vmean:  87.100 cm/s LVOT VTI:    0.240 m  AORTA Ao Root diam: 3.30 cm Ao Asc diam:  4.00 cm MITRAL VALVE MV Area (PHT): 4.21 cm     SHUNTS MV Decel Time: 180 msec     Systemic VTI:  0.24 m MV E velocity: 94.30 cm/s   Systemic Diam: 2.30 cm MV A velocity: 140.00 cm/s MV E/A ratio:  0.67 Vinie Maxcy MD Electronically signed by Vinie Maxcy MD Signature Date/Time: 06/12/2024/5:06:50 PM    Final    CT Angio Chest PE W and/or Wo Contrast Result Date: 06/12/2024 CLINICAL DATA:  Shortness of breath and cough. EXAM: CT ANGIOGRAPHY CHEST WITH CONTRAST TECHNIQUE: Multidetector CT imaging of the chest was performed using the standard protocol during bolus administration of intravenous contrast. Multiplanar CT image reconstructions and MIPs were obtained to evaluate the vascular anatomy. RADIATION DOSE REDUCTION: This exam was performed according to the departmental dose-optimization program which includes automated exposure control, adjustment of the mA and/or  kV according to patient size and/or use of iterative reconstruction technique. CONTRAST:  75mL OMNIPAQUE  IOHEXOL  350 MG/ML SOLN COMPARISON:  High-resolution chest CT 05/07/2020 FINDINGS: Cardiovascular: The heart is enlarged. No substantial pericardial effusion. No thoracic aortic aneurysm. There is no filling defect within the opacified pulmonary arteries to suggest the presence of an acute pulmonary embolus. Assessment of subsegmental pulmonary arteries to the lower lobes bilaterally is degraded by bolus timing and breathing motion. Mediastinum/Nodes: No mediastinal lymphadenopathy. There is no hilar lymphadenopathy. The esophagus has normal imaging features. There is no axillary lymphadenopathy. Lungs/Pleura: Patchy and nodular airspace disease is seen in both upper lungs with some peripheral sparing. Similar but less advanced patchy and nodular airspace disease is seen in the right middle and both lower lobes. No substantial pleural effusion. Upper Abdomen: Calcified gallstones evident. Sequelae of gastric bypass. Musculoskeletal: No worrisome lytic or sclerotic osseous abnormality. Review of the MIP images confirms the above findings. IMPRESSION: 1. No CT evidence for acute pulmonary embolus. Assessment of subsegmental pulmonary arteries to the lower lobes bilaterally is degraded by bolus timing and breathing motion. 2. Patchy and nodular airspace disease in both upper lungs with some peripheral sparing. Similar but less advanced patchy and nodular airspace disease is seen in the right middle and both lower lobes. Imaging features are compatible with multifocal pneumonia. While metastatic disease could have this appearance, patient age and disease distribution favors infectious/inflammatory etiology 3. Cholelithiasis. Electronically Signed   By: Camellia Candle M.D.   On: 06/12/2024 11:42   DG Chest 2 View Result Date: 06/12/2024 CLINICAL DATA:  Shortness of breath beginning yesterday. EXAM: CHEST - 2 VIEW  COMPARISON:  04/10/2020 FINDINGS: Stable mild cardiomegaly. New heterogeneous asymmetric airspace disease is seen, involving right lung greater than left, suspicious for edema or less likely infection. No pleural effusion. IMPRESSION: New heterogeneous asymmetric airspace disease, right lung greater than left, suspicious for edema or less likely infection. Electronically Signed   By: Norleen DELENA Kil M.D.   On: 06/12/2024 08:24    Scheduled Meds:  cetirizine   10 mg Oral Daily   enoxaparin  (LOVENOX ) injection  40 mg Subcutaneous QHS   Continuous Infusions:  azithromycin      cefTRIAXone  (ROCEPHIN )  IV       LOS: 1 day   Ivonne Mustache, MD [facility] P12/03/2024, 7:54 AM

## 2024-06-13 NOTE — Plan of Care (Signed)

## 2024-06-14 ENCOUNTER — Encounter (HOSPITAL_COMMUNITY): Payer: Self-pay | Admitting: Internal Medicine

## 2024-06-14 DIAGNOSIS — J9601 Acute respiratory failure with hypoxia: Secondary | ICD-10-CM | POA: Diagnosis not present

## 2024-06-14 LAB — CBC
HCT: 25.3 % — ABNORMAL LOW (ref 36.0–46.0)
Hemoglobin: 7.1 g/dL — ABNORMAL LOW (ref 12.0–15.0)
MCH: 23 pg — ABNORMAL LOW (ref 26.0–34.0)
MCHC: 28.1 g/dL — ABNORMAL LOW (ref 30.0–36.0)
MCV: 81.9 fL (ref 80.0–100.0)
Platelets: 152 K/uL (ref 150–400)
RBC: 3.09 MIL/uL — ABNORMAL LOW (ref 3.87–5.11)
RDW: 25.6 % — ABNORMAL HIGH (ref 11.5–15.5)
WBC: 9 K/uL (ref 4.0–10.5)
nRBC: 0 % (ref 0.0–0.2)

## 2024-06-14 LAB — PREPARE RBC (CROSSMATCH)

## 2024-06-14 LAB — BASIC METABOLIC PANEL WITH GFR
Anion gap: 9 (ref 5–15)
BUN: 10 mg/dL (ref 6–20)
CO2: 27 mmol/L (ref 22–32)
Calcium: 8.7 mg/dL — ABNORMAL LOW (ref 8.9–10.3)
Chloride: 102 mmol/L (ref 98–111)
Creatinine, Ser: 0.59 mg/dL (ref 0.44–1.00)
GFR, Estimated: >60 mL/min (ref >60)
Glucose, Bld: 111 mg/dL — ABNORMAL HIGH (ref 70–99)
Potassium: 3.8 mmol/L (ref 3.5–5.1)
Sodium: 137 mmol/L (ref 135–145)

## 2024-06-14 LAB — HEMOGLOBIN AND HEMATOCRIT, BLOOD
HCT: 29.7 % — ABNORMAL LOW (ref 36.0–46.0)
Hemoglobin: 8.3 g/dL — ABNORMAL LOW (ref 12.0–15.0)

## 2024-06-14 MED ORDER — SODIUM CHLORIDE 0.9% IV SOLUTION: Freq: Once | INTRAVENOUS | Status: AC

## 2024-06-14 MED ORDER — OXYCODONE HCL 5 MG PO TABS
5.0000 mg | ORAL_TABLET | ORAL | Status: DC | PRN
  Administered 2024-06-14 – 2024-06-15 (×5): 10 mg via ORAL
  Administered 2024-06-15: 5 mg via ORAL
  Administered 2024-06-15 – 2024-06-17 (×7): 10 mg via ORAL
  Filled 2024-06-14 (×13): qty 2

## 2024-06-14 MED ORDER — LIDOCAINE 4 % EX CREA
TOPICAL_CREAM | Freq: Two times a day (BID) | CUTANEOUS | Status: DC | PRN
  Filled 2024-06-14: qty 5

## 2024-06-14 MED ORDER — FUROSEMIDE 10 MG/ML IJ SOLN
40.0000 mg | Freq: Two times a day (BID) | INTRAMUSCULAR | Status: AC
  Administered 2024-06-14 (×2): 40 mg via INTRAVENOUS
  Filled 2024-06-14 (×2): qty 4

## 2024-06-14 MED ORDER — GUAIFENESIN ER 600 MG PO TB12
1200.0000 mg | ORAL_TABLET | Freq: Two times a day (BID) | ORAL | Status: DC
  Administered 2024-06-14 – 2024-06-17 (×6): 1200 mg via ORAL
  Filled 2024-06-14 (×6): qty 2

## 2024-06-14 NOTE — Progress Notes (Signed)
°   06/14/24 0824  TOC Brief Assessment  Insurance and Status Reviewed  Patient has primary care physician Yes  Home environment has been reviewed home with self  Prior level of function: independent  Prior/Current Home Services No current home services  Social Drivers of Health Review SDOH reviewed no interventions necessary  Readmission risk has been reviewed Yes  Transition of care needs no transition of care needs at this time

## 2024-06-14 NOTE — Progress Notes (Signed)
 Heart Failure Nurse Navigator Progress Note    Attempted to call patient on her cell phone and her Hospital room phone. No answer. Will try back to speak with patient and do Heart Failure education and to schedule a HF TOC hospital follow up appointment. Reached out to patients Nurse to bring her a Living Better with Heart Failure  Book.   Stephane Haddock, BSN, Scientist, Clinical (histocompatibility And Immunogenetics) Only

## 2024-06-14 NOTE — Progress Notes (Signed)
 Heart Failure Nurse Navigator Progress Note  PCP: Joshua Debby CROME, MD PCP-Cardiologist: None Admission Diagnosis: Acute congestive heart failure, Multifocal pneumonia, Acute hypoxic respiratory failure.  Admitted from: Home  Presentation:   [name] presented with shortness of breath and cough for 1-2 days. Reports she ran out of her inhaler, and has had body aches, and fatigued with exertion. States that she felt that she was needed to use her albuterol  more frequently with this current episode but states that this was not helping as much typically would.  BP 189/98, HR 108, Pro BNP 1,508, BMI 52.73, D-Dimer 0.58, CT angiogram did not show any PE but showed patchy and nodular airspace disease in both upper lungs with some peripheral sparing consistent with multifocal pneumonia.  Patient was started on antibiotics. 2D echo showed EF more than 75%, no regional wall motion abnormality, mild left ventricular hypertrophy, grade 1 left ventricular diastolic dysfunction.  Given a dose of Lasix  IV 40 mg once.   Patient was called at [facility], a Living Better with Heart Failure Book was provided. Education done on the sign and symptoms of heart failure, daily weights, when to call her doctor or go to the ED. Diet/ fluid restrictions, Patient reported to using salt, drinking dr. Nunzio and typically over 64 oz of fluids daily, and the use of canned foods. Continued education on taking all medications as prescribed , patient stated she does forget to take her medications at times. Continued with the importance of attending all medical appointments. Patient verbalized her understanding of all education. A HF TOC appointment was scheduled for 06/21/2024 @.   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       Social History   Socioeconomic History   Marital status: Single    Spouse name: Not on file   Number of children: Not on file   Years of education: Not on file   Highest education level: Some college, no degree  Occupational History   Not on file  Tobacco Use   Smoking status: Never    Passive exposure: Never   Smokeless tobacco: Never  Vaping Use   Vaping status: Former  Substance and Sexual Activity   Alcohol use: No   Drug use: No   Sexual activity: Not Currently    Partners: Male    Birth control/protection: Pill  Other Topics Concern   Not on file  Social History Narrative   Are you right handed or left handed? Right Handed   Are you currently employed ? Yes   What is your current occupation? [employer]   Do you live at home alone? No   Who lives with you? Children    What type of home do you live in: 1 story or 2 story? Lives in two story home       Social Drivers of Health   Financial Resource Strain: High Risk (02/01/2023)   Overall Financial Resource Strain (CARDIA)    Difficulty of Paying Living Expenses: Hard  Food Insecurity: No Food Insecurity (06/12/2024)   Hunger Vital Sign    Worried About Running Out of Food in the Last Year: Never true    Ran Out of Food in the Last Year: Never true  Transportation Needs: No Transportation Needs (06/12/2024)   PRAPARE - Administrator, Civil Service (Medical): No    Lack of Transportation (Non-Medical): No  Physical Activity: Unknown (02/01/2023)   Exercise Vital Sign    Days of Exercise per Week: 0 days    Minutes of Exercise per Session: Not on file  Stress: Stress Concern Present (02/01/2023)   [employer] of Occupational Health - Occupational  Stress Questionnaire    Feeling of Stress : Very much  Social Connections: Patient Declined (06/12/2024)   Social Connection and Isolation Panel    Frequency of Communication with Friends and Family: Patient declined    Frequency of Social Gatherings with Friends and Family: Patient declined    Attends Religious Services: Patient declined    Database Administrator or Organizations: Patient declined    Attends Engineer, Structural: Patient declined    Marital Status: Patient declined   Education Assessment and Provision:  Detailed education and instructions provided on heart failure disease management including the following:  Signs and symptoms of Heart Failure When to call the physician Importance of daily weights Low sodium diet Fluid restriction Medication management Anticipated future follow-up appointments  Patient education given on each of the above topics.  Patient acknowledges understanding via teach back method and acceptance of all instructions.  Education Materials:  Living Better With Heart Failure Booklet, HF zone tool, & Daily Weight Tracker Tool.  Patient has scale at home: Yes Patient has pill box at home: Yes    High Risk Criteria for Readmission and/or Poor Patient Outcomes: Heart failure hospital admissions (last 6 months): 1  No Show rate: 14% Difficult social situation: No, Lives with her sister and Nephew Demonstrates medication adherence: No, per patient , forgets to take them  Primary Language: English  Literacy level: Reading, writing, and comprehension   Barriers of Care:   Medication compliance Diet/ fluid restrictions ( some salt and soda ) Daily weights  Considerations/Referrals:   Referral made to Heart Failure Pharmacist Stewardship: NA, from [facility]  Referral made to Heart Failure CSW/NCM TOC: NA Referral made to Heart & Vascular TOC clinic: Yes, 06/21/2024 @ 9 am   Items for Follow-up on DC/TOC: Medication  compliance Diet/ fluid restrictions/ daily weights Continued HF education    Stephane Haddock, BSN, RN Heart Failure Print Production Planner Chat Only

## 2024-06-14 NOTE — Progress Notes (Signed)
 PT Cancellation Note  Patient Details Name: Avia Merkley MRN: [id number] DOB: [birth year]   Cancelled Treatment:    Reason Eval/Treat Not Completed: Medical issues which prohibited therapy  Patient reports that she just got up and back to bed. Noted dyspnea3-4/4. PT will check back another time. Darice Potters PT Acute Rehabilitation Services Office [phone] Potters Darice Norris 06/14/2024, 5:10 PM

## 2024-06-14 NOTE — Progress Notes (Signed)
 PROGRESS NOTE  [name]  FMW:[id number] DOB: [birth year] DOA: 06/12/2024 PCP: Joshua Debby CROME, MD   Brief Narrative: Patient is a 38 year old female with history of plaque psoriasis, generalized anxiety, morbid obesity, HFpEF, chronic normocytic anemia who presented with 2-day history of cough, shortness of breath.  Required 2 to 3 L of oxygen  per minute on presentation.  CT angiogram did not show any PE but showed patchy and nodular airspace disease in both upper lungs with some peripheral sparing consistent with multifocal pneumonia.  Patient was started on antibiotics.  Clinically improving but still requiring 2 to 3 days of oxygen  per minute.  Working on weaning the oxygen  today.  Assessment & Plan:  Active Problems:   Acute respiratory failure with hypoxia (HCC)   Acute hypoxic respiratory failure: Presented with shortness of breath, cough.  Requiring 2 to 3 L of oxygen  per minute.  Not on oxygen  at home.  Likely from pneumonia or mild volume overload.  Continue to wean oxygen .  Continue bronchodilators as needed.  Continue current medications.  Continue mucolytics  Acute on chronic HFpEF: Elevated BNP on presentation.  2D echo showed EF more than 75%, no regional wall motion abnormality, mild left ventricular hypertrophy, grade 1 left ventricular diastolic dysfunction.  Will give her 2 doses  d of Lasix  40 mg IV today.   Will monitor volume status.    Community-acquired pneumonia: No fever or leukocytosis.  CT imaging consistent with multifocal pneumonia.  Continue azithromycin , ceftriaxone .  Afebrile ,no leukocytosis.  Continue bronchodilators  Chronic normocytic anemia/vitamin B12 deficiency: History of menorrhagia in the past.  Hemoglobin in the range of 7.  Iron  panel optimal.  But showed vitamin B12 of  243.  Continue supplementation.  Given a unit of PRBC  Hypertension: Takes Bystolic  at home.  Restarted.    Hypokalemia: Supplemented with potassium  Morbid obesity:  BMI of 52.7  History of psoriasis: Follows with dermatologist.  On dupilumab       DVT prophylaxis:enoxaparin  (LOVENOX ) injection 40 mg Start: 06/12/24 2200     Code Status: Full Code  Family Communication: Mother at bedside  Patient status: Inpatient  Patient is from : Home  Anticipated discharge to: Home  Estimated DC date: 1 to 2 days   Consultants: None  Procedures: None  Antimicrobials:  Anti-infectives (From admission, onward)    Start     Dose/Rate Route Frequency Ordered Stop   06/13/24 1200  cefTRIAXone  (ROCEPHIN ) 1 g in sodium chloride  0.9 % 100 mL IVPB  Status:  Discontinued        1 g 200 mL/hr over 30 Minutes Intravenous Every 24 hours 06/12/24 1221 06/13/24 1048   06/13/24 1200  cefTRIAXone  (ROCEPHIN ) 2 g in sodium chloride  0.9 % 100 mL IVPB        2 g 200 mL/hr over 30 Minutes Intravenous Every 24 hours 06/13/24 1048 06/17/24 1159   06/13/24 1000  azithromycin  (ZITHROMAX ) 500 mg in sodium chloride  0.9 % 250 mL IVPB        500 mg 250 mL/hr over 60 Minutes Intravenous Every 24 hours 06/12/24 1221 06/17/24 0959   06/12/24 1200  cefTRIAXone  (ROCEPHIN ) 1 g in sodium chloride  0.9 % 100 mL IVPB        1 g 200 mL/hr over 30 Minutes Intravenous  Once 06/12/24 1153 06/12/24 1255   06/12/24 1200  azithromycin  (ZITHROMAX ) 500 mg in sodium chloride  0.9 % 250 mL IVPB        500 mg 250 mL/hr over  60 Minutes Intravenous  Once 06/12/24 1153 06/13/24 0740       Subjective: Patient seen and examined at bedside today.  Hemodynamically stable.  On 2 L of oxygen  per minute.  Feels slightly better today.  He still has some wet cough.  Lungs sounds better on auscultation this morning.  Denies any worsening shortness of breath or cough  Objective: Vitals:   06/13/24 1740 06/13/24 2032 06/14/24 0530 06/14/24 0805  BP: 124/78 130/75 (!) 145/83   Pulse: 99 95 94   Resp: (!) 22 20 (!) 21   Temp: 98.9 F (37.2 C) 99.1 F (37.3 C) 99.2 F (37.3 C)   TempSrc: Oral Oral  Oral   SpO2: 97% 95% 95% 96%  Weight:      Height:        Intake/Output Summary (Last 24 hours) at 06/14/2024 1103 Last data filed at 06/14/2024 0845 Gross per 24 hour  Intake 1550 ml  Output --  Net 1550 ml   Filed Weights   06/12/24 0651 06/12/24 1418  Weight: (!) 138.3 kg (!) 162 kg    Examination:  General exam: Overall comfortable, not in distress, morbidly obese HEENT: PERRL Respiratory system: Mildly diminished sounds bilaterally Cardiovascular system: S1 & S2 heard, RRR.  Gastrointestinal system: Abdomen is nondistended, soft and nontender. Central nervous system: Alert and oriented Extremities: No edema, no clubbing ,no cyanosis Skin: Psoriatic lesions, no ulcers,no icterus     Data Reviewed: I have personally reviewed following labs and imaging studies  CBC: Recent Labs  Lab 06/12/24 0745 06/13/24 0414 06/14/24 0424  WBC 7.9 8.4 9.0  HGB 7.5* 7.0* 7.1*  HCT 26.9* 24.9* 25.3*  MCV 81.3 80.3 81.9  PLT 209 176 152   Basic Metabolic Panel: Recent Labs  Lab 06/12/24 0745 06/13/24 0414 06/14/24 0424  NA 138 138 137  K 3.9 3.3* 3.8  CL 100 101 102  CO2 25 27 27   GLUCOSE 102* 93 111*  BUN 9 8 10   CREATININE 0.55 0.58 0.59  CALCIUM 8.8* 8.9 8.7*     Recent Results (from the past 240 hours)  Resp panel by RT-PCR (RSV, Flu A&B, Covid) Anterior Nasal Swab     Status: None   Collection Time: 06/12/24  6:53 AM   Specimen: Anterior Nasal Swab  Result Value Ref Range Status   SARS Coronavirus 2 by RT PCR NEGATIVE NEGATIVE Final    Comment: (NOTE) SARS-CoV-2 target nucleic acids are NOT DETECTED.  The SARS-CoV-2 RNA is generally detectable in upper respiratory specimens during the acute phase of infection. The lowest concentration of SARS-CoV-2 viral copies this assay can detect is 138 copies/mL. A negative result does not preclude SARS-Cov-2 infection and should not be used as the sole basis for treatment or other patient management decisions. A  negative result may occur with  improper specimen collection/handling, submission of specimen other than nasopharyngeal swab, presence of viral mutation(s) within the areas targeted by this assay, and inadequate number of viral copies(<138 copies/mL). A negative result must be combined with clinical observations, patient history, and epidemiological information. The expected result is Negative.  Fact Sheet for Patients:  bloggercourse.com  Fact Sheet for Healthcare Providers:  seriousbroker.it  This test is no t yet approved or cleared by the [place]  FDA and  has been authorized for detection and/or diagnosis of SARS-CoV-2 by FDA under an Emergency Use Authorization (EUA). This EUA will remain  in effect (meaning this test can be used) for the duration of the  COVID-19 declaration under Section 564(b)(1) of the Act, 21 U.S.C.section 360bbb-3(b)(1), unless the authorization is terminated  or revoked sooner.       Influenza A by PCR NEGATIVE NEGATIVE Final   Influenza B by PCR NEGATIVE NEGATIVE Final    Comment: (NOTE) The Xpert Xpress SARS-CoV-2/FLU/RSV plus assay is intended as an aid in the diagnosis of influenza from Nasopharyngeal swab specimens and should not be used as a sole basis for treatment. Nasal washings and aspirates are unacceptable for Xpert Xpress SARS-CoV-2/FLU/RSV testing.  Fact Sheet for Patients: bloggercourse.com  Fact Sheet for Healthcare Providers: seriousbroker.it  This test is not yet approved or cleared by the [place]  FDA and has been authorized for detection and/or diagnosis of SARS-CoV-2 by FDA under an Emergency Use Authorization (EUA). This EUA will remain in effect (meaning this test can be used) for the duration of the COVID-19 declaration under Section 564(b)(1) of the Act, 21 U.S.C. section 360bbb-3(b)(1), unless the authorization  is terminated or revoked.     Resp Syncytial Virus by PCR NEGATIVE NEGATIVE Final    Comment: (NOTE) Fact Sheet for Patients: bloggercourse.com  Fact Sheet for Healthcare Providers: seriousbroker.it  This test is not yet approved or cleared by the [place]  FDA and has been authorized for detection and/or diagnosis of SARS-CoV-2 by FDA under an Emergency Use Authorization (EUA). This EUA will remain in effect (meaning this test can be used) for the duration of the COVID-19 declaration under Section 564(b)(1) of the Act, 21 U.S.C. section 360bbb-3(b)(1), unless the authorization is terminated or revoked.  Performed at [facility], [address] [place], [place] [address]      Radiology Studies: ECHOCARDIOGRAM COMPLETE Result Date: 06/12/2024    ECHOCARDIOGRAM REPORT   Patient Name:   [name] [facility] Date of Exam: 06/12/2024 Medical Rec #:  [id number]             Height:       69.0 in Accession #:    [id number]            Weight:       357.1 lb Date of Birth:  [birth year]              BSA:          2.643 m Patient Age:    38 years              BP:           189/98 mmHg Patient Gender: F                     HR:           93 bpm. Exam Location:  Inpatient Procedure: 2D Echo, Cardiac Doppler, Color Doppler and Intracardiac            Opacification Agent (Both Spectral and Color Flow Doppler were            utilized during procedure). Indications:    CHF- Acute Systolic I50.21  History:        Patient has prior history of Echocardiogram examinations, most                 recent 03/24/2023. Risk Factors:Hypertension and Sleep Apnea.  Sonographer:    Koleen Popper RDCS Referring Phys: [id number] MIR M [facility]  Sonographer Comments: Patient is obese. Image acquisition challenging due to patient body habitus. IMPRESSIONS  1. Left ventricular ejection fraction, by estimation, is >75%. Left ventricular ejection  fraction by  PLAX is 75 %. The left ventricle has hyperdynamic function. The left ventricle has no regional wall motion abnormalities. There is mild left ventricular  hypertrophy. Left ventricular diastolic parameters are consistent with Grade I diastolic dysfunction (impaired relaxation).  2. Right ventricular systolic function is normal. The right ventricular size is normal.  3. The mitral valve is normal in structure. No evidence of mitral valve regurgitation.  4. The aortic valve is tricuspid. Aortic valve regurgitation is not visualized. No aortic stenosis is present.  5. Aortic dilatation noted. There is mild dilatation of the ascending aorta, measuring 40 mm.  6. The inferior vena cava is normal in size with greater than 50% respiratory variability, suggesting right atrial pressure of 3 mmHg. Comparison(s): Changes from prior study are noted. 03/24/2023: LVEF 60-65%. FINDINGS  Left Ventricle: Left ventricular ejection fraction, by estimation, is >75%. Left ventricular ejection fraction by PLAX is 75 %. The left ventricle has hyperdynamic function. The left ventricle has no regional wall motion abnormalities. Definity  contrast  agent was given IV to delineate the left ventricular endocardial borders. The left ventricular internal cavity size was normal in size. There is mild left ventricular hypertrophy. Left ventricular diastolic parameters are consistent with Grade I diastolic dysfunction (impaired relaxation). Indeterminate filling pressures. Right Ventricle: The right ventricular size is normal. No increase in right ventricular wall thickness. Right ventricular systolic function is normal. Left Atrium: Left atrial size was normal in size. Right Atrium: Right atrial size was normal in size. Pericardium: There is no evidence of pericardial effusion. Mitral Valve: The mitral valve is normal in structure. No evidence of mitral valve regurgitation. Tricuspid Valve: The tricuspid valve is grossly normal. Tricuspid valve  regurgitation is trivial. Aortic Valve: The aortic valve is tricuspid. Aortic valve regurgitation is not visualized. No aortic stenosis is present. Pulmonic Valve: The pulmonic valve was not well visualized. Pulmonic valve regurgitation is not visualized. Aorta: Aortic dilatation noted. There is mild dilatation of the ascending aorta, measuring 40 mm. Venous: The inferior vena cava is normal in size with greater than 50% respiratory variability, suggesting right atrial pressure of 3 mmHg. IAS/Shunts: No atrial level shunt detected by color flow Doppler.  LEFT VENTRICLE PLAX 2D LV EF:         Left            Diastology                ventricular     LV e' medial:    9.68 cm/s                ejection        LV E/e' medial:  9.7                fraction by     LV e' lateral:   10.20 cm/s                PLAX is 75      LV E/e' lateral: 9.2                %. LVIDd:         5.90 cm LVIDs:         3.30 cm LV PW:         1.20 cm LV IVS:        1.20 cm LVOT diam:     2.30 cm LV SV:         100 LV SV Index:  38 LVOT Area:     4.15 cm  RIGHT VENTRICLE             IVC RV S prime:     18.30 cm/s  IVC diam: 2.00 cm TAPSE (M-mode): 2.4 cm LEFT ATRIUM             Index LA diam:        4.20 cm 1.59 cm/m LA Vol (A2C):   66.3 ml 25.08 ml/m LA Vol (A4C):   68.0 ml 25.73 ml/m LA Biplane Vol: 67.2 ml 25.42 ml/m  AORTIC VALVE LVOT Vmax:   134.00 cm/s LVOT Vmean:  87.100 cm/s LVOT VTI:    0.240 m  AORTA Ao Root diam: 3.30 cm Ao Asc diam:  4.00 cm MITRAL VALVE MV Area (PHT): 4.21 cm     SHUNTS MV Decel Time: 180 msec     Systemic VTI:  0.24 m MV E velocity: 94.30 cm/s   Systemic Diam: 2.30 cm MV A velocity: 140.00 cm/s MV E/A ratio:  0.67 Vinie Maxcy MD Electronically signed by Vinie Maxcy MD Signature Date/Time: 06/12/2024/5:06:50 PM    Final     Scheduled Meds:  sodium chloride    Intravenous Once   cetirizine   10 mg Oral Daily   vitamin B-12  1,000 mcg Oral Daily   Dupilumab   300 mg Subcutaneous Q14 Days   enoxaparin   (LOVENOX ) injection  40 mg Subcutaneous QHS   furosemide   40 mg Intravenous Q12H   guaiFENesin   1,200 mg Oral BID   ipratropium-albuterol   3 mL Nebulization Q6H   nebivolol   10 mg Oral Daily   Continuous Infusions:  azithromycin  500 mg (06/14/24 1000)   cefTRIAXone  (ROCEPHIN )  IV 2 g (06/13/24 1141)     LOS: 2 days   Ivonne Mustache, MD [facility] P12/04/2024, 11:03 AM

## 2024-06-15 ENCOUNTER — Inpatient Hospital Stay (HOSPITAL_COMMUNITY)

## 2024-06-15 DIAGNOSIS — J9601 Acute respiratory failure with hypoxia: Secondary | ICD-10-CM | POA: Diagnosis not present

## 2024-06-15 LAB — BPAM RBC
Blood Product Expiration Date: 202601052359
ISSUE DATE / TIME: 202512101304
Unit Type and Rh: 7300

## 2024-06-15 LAB — BASIC METABOLIC PANEL WITH GFR
Anion gap: 10 (ref 5–15)
BUN: 7 mg/dL (ref 6–20)
CO2: 26 mmol/L (ref 22–32)
Calcium: 8.9 mg/dL (ref 8.9–10.3)
Chloride: 100 mmol/L (ref 98–111)
Creatinine, Ser: 0.66 mg/dL (ref 0.44–1.00)
GFR, Estimated: >60 mL/min (ref >60)
Glucose, Bld: 88 mg/dL (ref 70–99)
Potassium: 3.8 mmol/L (ref 3.5–5.1)
Sodium: 135 mmol/L (ref 135–145)

## 2024-06-15 LAB — CBC
HCT: 27 % — ABNORMAL LOW (ref 36.0–46.0)
Hemoglobin: 7.6 g/dL — ABNORMAL LOW (ref 12.0–15.0)
MCH: 23.5 pg — ABNORMAL LOW (ref 26.0–34.0)
MCHC: 28.1 g/dL — ABNORMAL LOW (ref 30.0–36.0)
MCV: 83.3 fL (ref 80.0–100.0)
Platelets: 155 K/uL (ref 150–400)
RBC: 3.24 MIL/uL — ABNORMAL LOW (ref 3.87–5.11)
RDW: 24.7 % — ABNORMAL HIGH (ref 11.5–15.5)
WBC: 10.2 K/uL (ref 4.0–10.5)
nRBC: 0 % (ref 0.0–0.2)

## 2024-06-15 LAB — TYPE AND SCREEN
ABO/RH(D): B POS
Antibody Screen: NEGATIVE
Unit division: 0

## 2024-06-15 MED ORDER — FUROSEMIDE 10 MG/ML IJ SOLN
40.0000 mg | Freq: Two times a day (BID) | INTRAMUSCULAR | Status: AC
  Administered 2024-06-15 (×2): 40 mg via INTRAVENOUS
  Filled 2024-06-15 (×2): qty 4

## 2024-06-15 MED ORDER — FUROSEMIDE 10 MG/ML IJ SOLN: 40.0000 mg | Freq: Once | INTRAMUSCULAR | Status: DC

## 2024-06-15 MED ORDER — IPRATROPIUM-ALBUTEROL 0.5-2.5 (3) MG/3ML IN SOLN
3.0000 mL | Freq: Three times a day (TID) | RESPIRATORY_TRACT | Status: DC
  Administered 2024-06-15 (×2): 3 mL via RESPIRATORY_TRACT
  Filled 2024-06-15 (×3): qty 3

## 2024-06-15 NOTE — Evaluation (Signed)
 Physical Therapy Evaluation Patient Details Name: [name] MRN: [id number] DOB: March 29, 1986 Today's Date: 06/15/2024  History of Present Illness  Patient is a 38 y/o female admitted 06/12/24 with cough SOB and concern for CHF exacerbation.  PMH positive for plaque psoriasis, GAD, morbid obesity, HF with preserved EF, sleep apnea, gastric bypass.  Clinical Impression  Patient presents with mobility limited due to decreased cardiorespiratory endurance and limited activity tolerance.  Noted dyspnea 3/4 with mobility and SpO2 drop to 83% while ambulating on 4L O2.  She is able to recover with cues and standing rest break.  PT will continue to follow while admitted, though likely no PT follow up needs at d/c.         If plan is discharge home, recommend the following: Help with stairs or ramp for entrance;Assistance with cooking/housework   Can travel by private vehicle        Equipment Recommendations None recommended by PT  Recommendations for Other Services       Functional Status Assessment Patient has had a recent decline in their functional status and demonstrates the ability to make significant improvements in function in a reasonable and predictable amount of time.     Precautions / Restrictions Precautions Recall of Precautions/Restrictions: Intact Precaution/Restrictions Comments: watch SpO2 (O2 dependent)      Mobility  Bed Mobility Overal bed mobility: Modified Independent                  Transfers Overall transfer level: Modified independent Equipment used: None                    Ambulation/Gait Ambulation/Gait assistance: Contact guard assist, Supervision Gait Distance (Feet): 150 Feet Assistive device: None Gait Pattern/deviations: Step-through pattern, Decreased stride length       General Gait Details: cues throughout for self monitoring noting SOB and SpO2 on 4L O2 dropping to 83% improves to 90% with cue for PLB and standing  rest x 20-30 sec  Stairs            Wheelchair Mobility     Tilt Bed    Modified Rankin (Stroke Patients Only)       Balance Overall balance assessment: Modified Independent                                           Pertinent Vitals/Pain Pain Assessment Pain Assessment: 0-10 Pain Score: 6  Pain Location: both legs and feet Pain Descriptors / Indicators: Aching, Burning, Sharp Pain Intervention(s): Monitored during session    Home Living Family/patient expects to be discharged to:: Private residence Living Arrangements: Children;Other relatives (sister, nephew, son) Available Help at Discharge: Family Type of Home: House Home Access: Level entry     Alternate Level Stairs-Number of Steps: 15 Home Layout: Two level Home Equipment: Agricultural [employer] (2 wheels)      Prior Function Prior Level of Function : Independent/Modified Independent               ADLs Comments: independent with self care, recently struggling to do things on her own; drives sometimes, 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)     Extremity/Trunk Assessment   Upper Extremity Assessment Upper Extremity Assessment: Overall WFL for tasks assessed    Lower Extremity Assessment Lower Extremity Assessment: RLE deficits/detail;LLE deficits/detail RLE Deficits / Details: noted edema in legs and covered  with lesions from psoriasis LLE Deficits / Details: noted edema in legs and covered with lesions from psoriasis    Cervical / Trunk Assessment Cervical / Trunk Assessment: Normal  Communication   Communication Communication: No apparent difficulties    Cognition Arousal: Alert Behavior During Therapy: WFL for tasks assessed/performed   PT - Cognitive impairments: No apparent impairments                         Following commands: Intact       Cueing Cueing Techniques: Verbal cues     General Comments General comments (skin integrity,  edema, etc.): walks to bathroom on her own, mother in the room and supportive    Exercises Other Exercises Other Exercises: issued incentive spirometer and educated in technique, to complete 5x every hour awake   Assessment/Plan    PT Assessment Patient needs continued PT services  PT Problem List Decreased activity tolerance;Decreased mobility;Cardiopulmonary status limiting activity       PT Treatment Interventions Gait training;Stair training;Patient/family education;Functional mobility training;Therapeutic activities;Therapeutic exercise    PT Goals (Current goals can be found in the Care Plan section)  Acute Rehab PT Goals Patient Stated Goal: return to independent PT Goal Formulation: With patient/family Time For Goal Achievement: 06/29/24 Potential to Achieve Goals: Good    Frequency Min 3X/week     Co-evaluation               AM-PAC PT 6 Clicks Mobility  Outcome Measure Help needed turning from your back to your side while in a flat bed without using bedrails?: None Help needed moving from lying on your back to sitting on the side of a flat bed without using bedrails?: None Help needed moving to and from a bed to a chair (including a wheelchair)?: None Help needed standing up from a chair using your arms (e.g., wheelchair or bedside chair)?: None Help needed to walk in hospital room?: None Help needed climbing 3-5 steps with a railing? : A Little 6 Click Score: 23    End of Session Equipment Utilized During Treatment: Oxygen  Activity Tolerance: Patient limited by fatigue Patient left: in bed;with call bell/phone within reach;with family/visitor present   PT Visit Diagnosis: Difficulty in walking, not elsewhere classified (R26.2)    Time: 8764-8692 PT Time Calculation (min) (ACUTE ONLY): 32 min   Charges:   PT Evaluation $PT Eval Moderate Complexity: 1 Mod PT Treatments $Gait Training: 8-22 mins PT General Charges $$ ACUTE PT VISIT: 1 Visit          Micheline Portal, PT Acute Rehabilitation Services [phone] 06/15/2024   Montie Portal 06/15/2024, 2:00 PM

## 2024-06-15 NOTE — Progress Notes (Signed)
 PROGRESS NOTE  [name]  FMW:[id number] DOB: 1985-10-22 DOA: 06/12/2024 PCP: Joshua Debby CROME, MD   Brief Narrative: Patient is a 38 year old female with history of plaque psoriasis, generalized anxiety, morbid obesity, HFpEF, chronic normocytic anemia who presented with 2-day history of cough, shortness of breath.  Required 2 to 3 L of oxygen  per minute on presentation.  CT angiogram did not show any PE but showed patchy and nodular airspace disease in both upper lungs with some peripheral sparing consistent with multifocal pneumonia.  Patient was started on antibiotics.  Clinically improving but still requiring 2 to 3 L of oxygen  per minute.  Working on weaning the oxygen  before dc  Assessment & Plan:  Active Problems:   Acute respiratory failure with hypoxia (HCC)   Acute hypoxic respiratory failure: Presented with shortness of breath, cough.  Requiring 2  L of oxygen  per minute.  Not on oxygen  at home.  Likely from pneumonia or mild volume overload.  Continue to wean oxygen .  Continue bronchodilators as needed.  Continue current medications.  Continue mucolytics  Acute on chronic HFpEF: Elevated BNP on presentation.  2D echo showed EF more than 75%, no regional wall motion abnormality, mild left ventricular hypertrophy, grade 1 left ventricular diastolic dysfunction.  Will give additional 2 doses of IV Lasix  40 mg  today.  Might need oral Lasix  at home.  She will follow-up with CHF team as an outpatient.Has an appointment already  Community-acquired pneumonia: No fever or leukocytosis.  CT imaging consistent with multifocal pneumonia.  Continue azithromycin , ceftriaxone .  Afebrile ,no leukocytosis.  Continue bronchodilators.  Will check follow-up chest x-ray today.  Chronic normocytic anemia/vitamin B12 deficiency: History of menorrhagia in the past.  Hemoglobin in the range of 7.  Iron  panel optimal.  But showed vitamin B12 of  243.  Continue supplementation.  Given a unit of  PRBC.  Hemoglobin is  currently stable in the range of 7  Hypertension: Takes Bystolic  at home.  Restarted.    Hypokalemia: Supplemented with potassium  Morbid obesity: BMI of 52.7  History of psoriasis: Follows with dermatologist.  On dupilumab   Deconditioning/weakness: PT consulted      DVT prophylaxis:enoxaparin  (LOVENOX ) injection 40 mg Start: 06/12/24 2200     Code Status: Full Code  Family Communication: Mother at bedside  Patient status: Inpatient  Patient is from : Home  Anticipated discharge to: Home  Estimated DC date: 1 to 2 days   Consultants: None  Procedures: None  Antimicrobials:  Anti-infectives (From admission, onward)    Start     Dose/Rate Route Frequency Ordered Stop   06/13/24 1200  cefTRIAXone  (ROCEPHIN ) 1 g in sodium chloride  0.9 % 100 mL IVPB  Status:  Discontinued        1 g 200 mL/hr over 30 Minutes Intravenous Every 24 hours 06/12/24 1221 06/13/24 1048   06/13/24 1200  cefTRIAXone  (ROCEPHIN ) 2 g in sodium chloride  0.9 % 100 mL IVPB        2 g 200 mL/hr over 30 Minutes Intravenous Every 24 hours 06/13/24 1048 06/17/24 1159   06/13/24 1000  azithromycin  (ZITHROMAX ) 500 mg in sodium chloride  0.9 % 250 mL IVPB        500 mg 250 mL/hr over 60 Minutes Intravenous Every 24 hours 06/12/24 1221 06/17/24 0959   06/12/24 1200  cefTRIAXone  (ROCEPHIN ) 1 g in sodium chloride  0.9 % 100 mL IVPB        1 g 200 mL/hr over 30 Minutes Intravenous  Once  06/12/24 1153 06/12/24 1255   06/12/24 1200  azithromycin  (ZITHROMAX ) 500 mg in sodium chloride  0.9 % 250 mL IVPB        500 mg 250 mL/hr over 60 Minutes Intravenous  Once 06/12/24 1153 06/13/24 0740       Subjective: Patient seen and examined at bedside today.  Lying in bed.  Overall looks comfortable.  Feels better today.  Cough is better.  Still on 2 L of oxygen  per minute.  Desaturated on weaning yesterday.  Has trace bilateral lower extremity pitting edema  Objective: Vitals:   06/14/24 1527  06/14/24 2109 06/15/24 0622 06/15/24 0915  BP: 139/70 129/70 (!) 153/88   Pulse: 95 92 89   Resp:  20 20   Temp: 98.4 F (36.9 C) 97.7 F (36.5 C) 99.9 F (37.7 C)   TempSrc: Oral Oral Oral   SpO2: 95% 95% 94% 96%  Weight:      Height:        Intake/Output Summary (Last 24 hours) at 06/15/2024 1208 Last data filed at 06/15/2024 0600 Gross per 24 hour  Intake 1632 ml  Output 5500 ml  Net -3868 ml   Filed Weights   06/12/24 0651 06/12/24 1418  Weight: (!) 138.3 kg (!) 162 kg    Examination:   General exam: Overall comfortable, not in distress, morbidly obese HEENT: PERRL Respiratory system: Mildly air sounds bilaterally, no wheezing or crackles Cardiovascular system: S1 & S2 heard, RRR.  Gastrointestinal system: Abdomen is nondistended, soft and nontender. Central nervous system: Alert and oriented Extremities: Trace bilateral lower extremity pitting edema, no clubbing ,no cyanosis Skin: No rashes, no ulcers,no icterus    , psoriatic lesions   Data Reviewed: I have personally reviewed following labs and imaging studies  CBC: Recent Labs  Lab 06/12/24 0745 06/13/24 0414 06/14/24 0424 06/14/24 2123 06/15/24 0423  WBC 7.9 8.4 9.0  --  10.2  HGB 7.5* 7.0* 7.1* 8.3* 7.6*  HCT 26.9* 24.9* 25.3* 29.7* 27.0*  MCV 81.3 80.3 81.9  --  83.3  PLT 209 176 152  --  155   Basic Metabolic Panel: Recent Labs  Lab 06/12/24 0745 06/13/24 0414 06/14/24 0424  NA 138 138 137  K 3.9 3.3* 3.8  CL 100 101 102  CO2 25 27 27   GLUCOSE 102* 93 111*  BUN 9 8 10   CREATININE 0.55 0.58 0.59  CALCIUM 8.8* 8.9 8.7*     Recent Results (from the past 240 hours)  Resp panel by RT-PCR (RSV, Flu A&B, Covid) Anterior Nasal Swab     Status: None   Collection Time: 06/12/24  6:53 AM   Specimen: Anterior Nasal Swab  Result Value Ref Range Status   SARS Coronavirus 2 by RT PCR NEGATIVE NEGATIVE Final    Comment: (NOTE) SARS-CoV-2 target nucleic acids are NOT DETECTED.  The  SARS-CoV-2 RNA is generally detectable in upper respiratory specimens during the acute phase of infection. The lowest concentration of SARS-CoV-2 viral copies this assay can detect is 138 copies/mL. A negative result does not preclude SARS-Cov-2 infection and should not be used as the sole basis for treatment or other patient management decisions. A negative result may occur with  improper specimen collection/handling, submission of specimen other than nasopharyngeal swab, presence of viral mutation(s) within the areas targeted by this assay, and inadequate number of viral copies(<138 copies/mL). A negative result must be combined with clinical observations, patient history, and epidemiological information. The expected result is Negative.  Fact Sheet for  Patients:  bloggercourse.com  Fact Sheet for Healthcare Providers:  seriousbroker.it  This test is no t yet approved or cleared by the [place]  FDA and  has been authorized for detection and/or diagnosis of SARS-CoV-2 by FDA under an Emergency Use Authorization (EUA). This EUA will remain  in effect (meaning this test can be used) for the duration of the COVID-19 declaration under Section 564(b)(1) of the Act, 21 U.S.C.section 360bbb-3(b)(1), unless the authorization is terminated  or revoked sooner.       Influenza A by PCR NEGATIVE NEGATIVE Final   Influenza B by PCR NEGATIVE NEGATIVE Final    Comment: (NOTE) The Xpert Xpress SARS-CoV-2/FLU/RSV plus assay is intended as an aid in the diagnosis of influenza from Nasopharyngeal swab specimens and should not be used as a sole basis for treatment. Nasal washings and aspirates are unacceptable for Xpert Xpress SARS-CoV-2/FLU/RSV testing.  Fact Sheet for Patients: bloggercourse.com  Fact Sheet for Healthcare Providers: seriousbroker.it  This test is not yet approved or  cleared by the [place]  FDA and has been authorized for detection and/or diagnosis of SARS-CoV-2 by FDA under an Emergency Use Authorization (EUA). This EUA will remain in effect (meaning this test can be used) for the duration of the COVID-19 declaration under Section 564(b)(1) of the Act, 21 U.S.C. section 360bbb-3(b)(1), unless the authorization is terminated or revoked.     Resp Syncytial Virus by PCR NEGATIVE NEGATIVE Final    Comment: (NOTE) Fact Sheet for Patients: bloggercourse.com  Fact Sheet for Healthcare Providers: seriousbroker.it  This test is not yet approved or cleared by the [place]  FDA and has been authorized for detection and/or diagnosis of SARS-CoV-2 by FDA under an Emergency Use Authorization (EUA). This EUA will remain in effect (meaning this test can be used) for the duration of the COVID-19 declaration under Section 564(b)(1) of the Act, 21 U.S.C. section 360bbb-3(b)(1), unless the authorization is terminated or revoked.  Performed at [facility], [address] [place], [place] [address]      Radiology Studies: No results found.   Scheduled Meds:  cetirizine   10 mg Oral Daily   vitamin B-12  1,000 mcg Oral Daily   Dupilumab   300 mg Subcutaneous Q14 Days   enoxaparin  (LOVENOX ) injection  40 mg Subcutaneous QHS   furosemide   40 mg Intravenous Once   guaiFENesin   1,200 mg Oral BID   ipratropium-albuterol   3 mL Nebulization TID   nebivolol   10 mg Oral Daily   Continuous Infusions:  azithromycin  500 mg (06/15/24 0946)   cefTRIAXone  (ROCEPHIN )  IV 2 g (06/14/24 1146)     LOS: 3 days   Ivonne Mustache, MD [facility] P12/05/2024, 12:08 PM

## 2024-06-16 DIAGNOSIS — J9601 Acute respiratory failure with hypoxia: Secondary | ICD-10-CM | POA: Diagnosis not present

## 2024-06-16 LAB — CBC WITH DIFFERENTIAL/PLATELET
Abs Immature Granulocytes: 0.11 K/uL — ABNORMAL HIGH (ref 0.00–0.07)
Basophils Absolute: 0.1 K/uL (ref 0.0–0.1)
Basophils Relative: 1 %
Eosinophils Absolute: 0.6 K/uL — ABNORMAL HIGH (ref 0.0–0.5)
Eosinophils Relative: 6 %
HCT: 27.2 % — ABNORMAL LOW (ref 36.0–46.0)
Hemoglobin: 7.5 g/dL — ABNORMAL LOW (ref 12.0–15.0)
Immature Granulocytes: 1 %
Lymphocytes Relative: 10 %
Lymphs Abs: 1 K/uL (ref 0.7–4.0)
MCH: 23.7 pg — ABNORMAL LOW (ref 26.0–34.0)
MCHC: 27.6 g/dL — ABNORMAL LOW (ref 30.0–36.0)
MCV: 86.1 fL (ref 80.0–100.0)
Monocytes Absolute: 1.2 K/uL — ABNORMAL HIGH (ref 0.1–1.0)
Monocytes Relative: 12 %
Neutro Abs: 7.5 K/uL (ref 1.7–7.7)
Neutrophils Relative %: 70 %
Platelets: 144 K/uL — ABNORMAL LOW (ref 150–400)
RBC: 3.16 MIL/uL — ABNORMAL LOW (ref 3.87–5.11)
RDW: 25.8 % — ABNORMAL HIGH (ref 11.5–15.5)
Smear Review: NORMAL
WBC: 10.5 K/uL (ref 4.0–10.5)
nRBC: 0.4 % — ABNORMAL HIGH (ref 0.0–0.2)

## 2024-06-16 LAB — CBC
HCT: 26.3 % — ABNORMAL LOW (ref 36.0–46.0)
Hemoglobin: 7.4 g/dL — ABNORMAL LOW (ref 12.0–15.0)
MCH: 23.7 pg — ABNORMAL LOW (ref 26.0–34.0)
MCHC: 28.1 g/dL — ABNORMAL LOW (ref 30.0–36.0)
MCV: 84.3 fL (ref 80.0–100.0)
Platelets: 140 K/uL — ABNORMAL LOW (ref 150–400)
RBC: 3.12 MIL/uL — ABNORMAL LOW (ref 3.87–5.11)
RDW: 25.7 % — ABNORMAL HIGH (ref 11.5–15.5)
WBC: 10.4 K/uL (ref 4.0–10.5)
nRBC: 0.5 % — ABNORMAL HIGH (ref 0.0–0.2)

## 2024-06-16 LAB — BASIC METABOLIC PANEL WITH GFR
Anion gap: 10 (ref 5–15)
BUN: 9 mg/dL (ref 6–20)
CO2: 28 mmol/L (ref 22–32)
Calcium: 8.8 mg/dL — ABNORMAL LOW (ref 8.9–10.3)
Chloride: 100 mmol/L (ref 98–111)
Creatinine, Ser: 0.64 mg/dL (ref 0.44–1.00)
GFR, Estimated: >60 mL/min (ref >60)
Glucose, Bld: 94 mg/dL (ref 70–99)
Potassium: 3.5 mmol/L (ref 3.5–5.1)
Sodium: 138 mmol/L (ref 135–145)

## 2024-06-16 LAB — PRO BRAIN NATRIURETIC PEPTIDE: Pro Brain Natriuretic Peptide: 445 pg/mL — ABNORMAL HIGH (ref <300.0)

## 2024-06-16 MED ORDER — IPRATROPIUM-ALBUTEROL 0.5-2.5 (3) MG/3ML IN SOLN: 3.0000 mL | Freq: Four times a day (QID) | RESPIRATORY_TRACT | Status: DC | PRN

## 2024-06-16 NOTE — Progress Notes (Signed)
 PROGRESS NOTE  [name]  FMW:[id number] DOB: [birth year] DOA: 06/12/2024 PCP: Joshua Debby CROME, MD   Brief Narrative: Patient is a 38 year old female with history of plaque psoriasis, generalized anxiety, morbid obesity, HFpEF, chronic normocytic anemia who presented with 2-day history of cough, shortness of breath.  Required 2 to 3 L of oxygen  per minute on presentation.  CT angiogram did not show any PE but showed patchy and nodular airspace disease in both upper lungs with some peripheral sparing consistent with multifocal pneumonia.  Patient was started on antibiotics.  Clinically improving but still requiring 2 to 3 L of oxygen  per minute.  Working on weaning the oxygen  before dc  06/16/2024: Patient seen alongside patient's nurse.  Patient seems to be improving.  Prior to presentation, patient reported shortness of breath, dyspnea on minimal exertion and leg edema.  Not clear on orthopnea.  Denied fever or chills.  Denied productive cough.  Procalcitonin of less than 0.1.  proBNP is improving.  Patient was not on home oxygen  prior to presentation.  The only time patient has been on oxygen  was when she had COVID in 2021.  Wean of supplemental oxygen .  Check CBC with differential (patient is Dupixent , started about 4 to 6 weeks ago.  Check eosinophil level).  PT/OT consult.  Patient denies prior diagnosis of OSA.  BMI of 52 kg/m.  She may benefit from Ozempic or Mounjaro.  Consider repeating sleep studies.  Assessment & Plan:  Principal Problem:   Acute respiratory failure with hypoxia (HCC)   Acute hypoxic respiratory failure:  -Presented with shortness of breath, cough.  Requiring 2  L of oxygen  per minute.  Not on oxygen  at home.  Likely from pneumonia or mild volume overload.  Continue to wean oxygen .  Continue bronchodilators as needed.  Continue current medications.  Continue mucolytics 06/16/2024: Gradually wean patient off supplemental oxygen .  Keep O2 sat greater than 91%.   Assess need for home oxygen  prior to discharge.  Acute on chronic HFpEF: Elevated BNP on presentation.  2D echo showed EF more than 75%, no regional wall motion abnormality, mild left ventricular hypertrophy, grade 1 left ventricular diastolic dysfunction.  Will give additional 2 doses of IV Lasix  40 mg  today.  Might need oral Lasix  at home.  She will follow-up with CHF team as an outpatient.Has an appointment already  Community-acquired pneumonia:  -No fever or leukocytosis.   -CT imaging suggestive of multifocal pneumonia.  However, see below, high resolution CT Chest done in November 2021. -Patient is afebrile, no leukocytosis.   -Negative procalcitonin.   -Dupixent  was restarted recently.  Check CBC with differential (rule out eosinophilia).  -Patient has history of COVID in 2021.  Patient will follow up with pulmonary team on discharge (see below)  High-resolution CT chest without contrast done on 05/07/2020 revealed: minimal patchy ground-glass opacities throughout both lungs without significant regions of reticulation, bronchiectasis or distortion. 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 Consider follow-up high-resolution chest CT study in 12 months to assess temporal pattern stability, as clinically warranted.  Chronic normocytic anemia/vitamin B12 deficiency:  -History of menorrhagia in the past.   -Hemoglobin of 7.5 g/dL today. - Recent iron  panel revealed iron  of 65, TSAT of 10%, TIBC of 636.  Likely from blood loss (history of menorrhagia noted). - Folate of 8.5.  - Vitamin B12 of 243.   -Start patient on Ferrous sulfate   -Continue vitamin B12 -Pelvic ultrasound on discharge to rule out  uterine fibroid  Hypertension:  -Controlled    Hypokalemia:  -Potassium of 3.5 today.  Morbid obesity:  BMI of 52.7 Diet and exercise PCP to consider [place] Ozempic or Mounjaro (ie if not contra indicated)  History of  psoriasis:  Follows with dermatologist.  On dupilumab   Deconditioning/weakness:  -PT consulted      DVT prophylaxis:enoxaparin  (LOVENOX ) injection 40 mg Start: 06/12/24 2200     Code Status: Full Code  Family Communication:    Patient status: Inpatient  Patient is from : Home  Anticipated discharge to: Home  Estimated DC date: 1 to 2 days   Consultants: None  Procedures: None  Antimicrobials:  Anti-infectives (From admission, onward)    Start     Dose/Rate Route Frequency Ordered Stop   06/13/24 1200  cefTRIAXone  (ROCEPHIN ) 1 g in sodium chloride  0.9 % 100 mL IVPB  Status:  Discontinued        1 g 200 mL/hr over 30 Minutes Intravenous Every 24 hours 06/12/24 1221 06/13/24 1048   06/13/24 1200  cefTRIAXone  (ROCEPHIN ) 2 g in sodium chloride  0.9 % 100 mL IVPB        2 g 200 mL/hr over 30 Minutes Intravenous Every 24 hours 06/13/24 1048 06/16/24 1329   06/13/24 1000  azithromycin  (ZITHROMAX ) 500 mg in sodium chloride  0.9 % 250 mL IVPB        500 mg 250 mL/hr over 60 Minutes Intravenous Every 24 hours 06/12/24 1221 06/16/24 1017   06/12/24 1200  cefTRIAXone  (ROCEPHIN ) 1 g in sodium chloride  0.9 % 100 mL IVPB        1 g 200 mL/hr over 30 Minutes Intravenous  Once 06/12/24 1153 06/12/24 1255   06/12/24 1200  azithromycin  (ZITHROMAX ) 500 mg in sodium chloride  0.9 % 250 mL IVPB        500 mg 250 mL/hr over 60 Minutes Intravenous  Once 06/12/24 1153 06/13/24 0740       Subjective: Patient seen and examined at bedside today.  Lying in bed.  Overall looks comfortable.  Feels better today.  Cough is better.  Still on 2 L of oxygen  per minute.  Desaturated on weaning yesterday.  Has trace bilateral lower extremity pitting edema  Objective: Vitals:   06/15/24 1312 06/15/24 1357 06/15/24 2055 06/16/24 0453  BP: 127/73  (!) 144/77 137/61  Pulse: 79  84 87  Resp: 20  18 18   Temp: 99.2 F (37.3 C)  100 F (37.8 C) 98.2 F (36.8 C)  TempSrc: Oral  Oral Oral  SpO2: 98% 98%  99% 100%  Weight:      Height:        Intake/Output Summary (Last 24 hours) at 06/16/2024 1354 Last data filed at 06/16/2024 0700 Gross per 24 hour  Intake 1000 ml  Output 2700 ml  Net -1700 ml   Filed Weights   06/12/24 0651 06/12/24 1418  Weight: (!) 138.3 kg (!) 162 kg    Examination:   General exam: Patient is morbidly obese.  Not in any distress.  Awake and alert.   HEENT: Patient is pale.  Respiratory system: Adequate air entry. Cardiovascular system: S1 & S2 heard, RRR.  Gastrointestinal system: Abdomen is morbidly obese.  Abdomen is soft and nontender.   Central nervous system: Awake and alert.   Extremities: No leg edema.   Skin: Psoriatic lesions.     Data Reviewed: I have personally reviewed following labs and imaging studies  CBC: Recent Labs  Lab 06/12/24 0745 06/13/24 0414  06/14/24 0424 06/14/24 2123 06/15/24 0423 06/16/24 0621  WBC 7.9 8.4 9.0  --  10.2 10.4  HGB 7.5* 7.0* 7.1* 8.3* 7.6* 7.4*  HCT 26.9* 24.9* 25.3* 29.7* 27.0* 26.3*  MCV 81.3 80.3 81.9  --  83.3 84.3  PLT 209 176 152  --  155 140*   Basic Metabolic Panel: Recent Labs  Lab 06/12/24 0745 06/13/24 0414 06/14/24 0424 06/15/24 1228 06/16/24 0401  NA 138 138 137 135 138  K 3.9 3.3* 3.8 3.8 3.5  CL 100 101 102 100 100  CO2 25 27 27 26 28   GLUCOSE 102* 93 111* 88 94  BUN 9 8 10 7 9   CREATININE 0.55 0.58 0.59 0.66 0.64  CALCIUM 8.8* 8.9 8.7* 8.9 8.8*     Recent Results (from the past 240 hours)  Resp panel by RT-PCR (RSV, Flu A&B, Covid) Anterior Nasal Swab     Status: None   Collection Time: 06/12/24  6:53 AM   Specimen: Anterior Nasal Swab  Result Value Ref Range Status   SARS Coronavirus 2 by RT PCR NEGATIVE NEGATIVE Final    Comment: (NOTE) SARS-CoV-2 target nucleic acids are NOT DETECTED.  The SARS-CoV-2 RNA is generally detectable in upper respiratory specimens during the acute phase of infection. The lowest concentration of SARS-CoV-2 viral copies this assay  can detect is 138 copies/mL. A negative result does not preclude SARS-Cov-2 infection and should not be used as the sole basis for treatment or other patient management decisions. A negative result may occur with  improper specimen collection/handling, submission of specimen other than nasopharyngeal swab, presence of viral mutation(s) within the areas targeted by this assay, and inadequate number of viral copies(<138 copies/mL). A negative result must be combined with clinical observations, patient history, and epidemiological information. The expected result is Negative.  Fact Sheet for Patients:  bloggercourse.com  Fact Sheet for Healthcare Providers:  seriousbroker.it  This test is no t yet approved or cleared by the [place]  FDA and  has been authorized for detection and/or diagnosis of SARS-CoV-2 by FDA under an Emergency Use Authorization (EUA). This EUA will remain  in effect (meaning this test can be used) for the duration of the COVID-19 declaration under Section 564(b)(1) of the Act, 21 U.S.C.section 360bbb-3(b)(1), unless the authorization is terminated  or revoked sooner.       Influenza A by PCR NEGATIVE NEGATIVE Final   Influenza B by PCR NEGATIVE NEGATIVE Final    Comment: (NOTE) The Xpert Xpress SARS-CoV-2/FLU/RSV plus assay is intended as an aid in the diagnosis of influenza from Nasopharyngeal swab specimens and should not be used as a sole basis for treatment. Nasal washings and aspirates are unacceptable for Xpert Xpress SARS-CoV-2/FLU/RSV testing.  Fact Sheet for Patients: bloggercourse.com  Fact Sheet for Healthcare Providers: seriousbroker.it  This test is not yet approved or cleared by the [place]  FDA and has been authorized for detection and/or diagnosis of SARS-CoV-2 by FDA under an Emergency Use Authorization (EUA). This EUA will  remain in effect (meaning this test can be used) for the duration of the COVID-19 declaration under Section 564(b)(1) of the Act, 21 U.S.C. section 360bbb-3(b)(1), unless the authorization is terminated or revoked.     Resp Syncytial Virus by PCR NEGATIVE NEGATIVE Final    Comment: (NOTE) Fact Sheet for Patients: bloggercourse.com  Fact Sheet for Healthcare Providers: seriousbroker.it  This test is not yet approved or cleared by the [place]  FDA and has been authorized for detection and/or diagnosis  of SARS-CoV-2 by FDA under an Emergency Use Authorization (EUA). This EUA will remain in effect (meaning this test can be used) for the duration of the COVID-19 declaration under Section 564(b)(1) of the Act, 21 U.S.C. section 360bbb-3(b)(1), unless the authorization is terminated or revoked.  Performed at [facility], [address] [place], [place] [address]      Radiology Studies: DG CHEST PORT 1 VIEW Result Date: 06/15/2024 CLINICAL DATA:  Shortness of breath. EXAM: PORTABLE CHEST 1 VIEW COMPARISON:  Chest radiograph dated 06/12/2024. FINDINGS: Cardiomegaly with vascular congestion and edema. Pneumonia is not excluded. No pleural effusion pneumothorax. No acute osseous pathology. IMPRESSION: Similar appearance of CHF.  Pneumonia is not excluded. Electronically Signed   By: Vanetta Chou M.D.   On: 06/15/2024 18:07     Scheduled Meds:  cetirizine   10 mg Oral Daily   vitamin B-12  1,000 mcg Oral Daily   Dupilumab   300 mg Subcutaneous Q14 Days   enoxaparin  (LOVENOX ) injection  40 mg Subcutaneous QHS   guaiFENesin   1,200 mg Oral BID   ipratropium-albuterol   3 mL Nebulization TID   nebivolol   10 mg Oral Daily   Continuous Infusions:  Time spent: 55 minutes.   LOS: 4 days   Leatrice LILLETTE Chapel, MD [facility] P12/06/2024, 1:54 PM

## 2024-06-16 NOTE — Progress Notes (Signed)
 Physical Therapy Treatment Patient Details Name: [name] MRN: [id number] DOB: 16-Jul-1985 Today's Date: 06/16/2024   History of Present Illness Patient is a 38 y/o female admitted 06/12/24 with cough SOB and concern for CHF exacerbation.  PMH positive for plaque psoriasis, GAD, morbid obesity, HF with preserved EF, sleep apnea, gastric bypass.    PT Comments  Pt making excellent progress today.  She was able to ambulate 300' on 2 L (was on 4 yesterday) and sats maintained >94% (yesterday dropped into 80's).  Pt reports feeling better today.  Educated on gradually increasing endurance, rest breaks, and safety.  Continue POC.  No PT needs at d/c.     If plan is discharge home, recommend the following: Help with stairs or ramp for entrance;Assistance with cooking/housework   Can travel by private vehicle        Equipment Recommendations  None recommended by PT    Recommendations for Other Services       Precautions / Restrictions Precautions Precautions: None     Mobility  Bed Mobility Overal bed mobility: Modified Independent                  Transfers Overall transfer level: Modified independent Equipment used: None                    Ambulation/Gait Ambulation/Gait assistance: Supervision Gait Distance (Feet): 300 Feet Assistive device: None Gait Pattern/deviations: Step-through pattern, Decreased stride length Gait velocity: decreased but functional     General Gait Details: Pt on 2 L O2 with sats >94% throughout, pt able to talk at times wtih activity wtihout sats dropping   Stairs             Wheelchair Mobility     Tilt Bed    Modified Rankin (Stroke Patients Only)       Balance Overall balance assessment: Modified Independent                                          Communication    Cognition Arousal: Alert Behavior During Therapy: WFL for tasks assessed/performed   PT - Cognitive impairments:  No apparent impairments                                Cueing    Exercises      General Comments General comments (skin integrity, edema, etc.): Pt has been mobilizing in room on her own.  Educated on gradually increasing endurance, taking rest breaks as needed, and recommending frequent short bouts of activity throughout the day.      Pertinent Vitals/Pain Pain Assessment Pain Assessment: No/denies pain    Home Living                          Prior Function            PT Goals (current goals can now be found in the care plan section) Progress towards PT goals: Progressing toward goals    Frequency    Min 1X/week      PT Plan      Co-evaluation              AM-PAC PT 6 Clicks Mobility   Outcome Measure  Help needed turning from your back to your side  while in a flat bed without using bedrails?: None Help needed moving from lying on your back to sitting on the side of a flat bed without using bedrails?: None Help needed moving to and from a bed to a chair (including a wheelchair)?: None Help needed standing up from a chair using your arms (e.g., wheelchair or bedside chair)?: None Help needed to walk in hospital room?: None Help needed climbing 3-5 steps with a railing? : A Little 6 Click Score: 23    End of Session Equipment Utilized During Treatment: Oxygen  Activity Tolerance: Patient tolerated treatment well Patient left: Other (comment) (sitting on window bench) Nurse Communication: Mobility status PT Visit Diagnosis: Difficulty in walking, not elsewhere classified (R26.2)     Time: 8577-8557 PT Time Calculation (min) (ACUTE ONLY): 20 min  Charges:    $Gait Training: 8-22 mins PT General Charges $$ ACUTE PT VISIT: 1 Visit                     Benjiman, PT Acute Rehab Services [facility] Re[facility] [phone]    Benjiman VEAR Mulberry 06/16/2024, 3:21 PM

## 2024-06-17 ENCOUNTER — Other Ambulatory Visit: Payer: Self-pay | Admitting: Internal Medicine

## 2024-06-17 ENCOUNTER — Other Ambulatory Visit (HOSPITAL_COMMUNITY): Payer: Self-pay

## 2024-06-17 MED ORDER — FERROUS SULFATE 325 (65 FE) MG PO TABS
325.0000 mg | ORAL_TABLET | Freq: Every day | ORAL | Status: DC
  Administered 2024-06-17: 325 mg via ORAL
  Filled 2024-06-17: qty 1

## 2024-06-17 MED ORDER — TRAZODONE HCL 50 MG PO TABS
25.0000 mg | ORAL_TABLET | Freq: Every evening | ORAL | 1 refills | Status: AC | PRN
  Filled 2024-06-17: qty 30, 60d supply, fill #0

## 2024-06-17 MED ORDER — FERROUS SULFATE 325 (65 FE) MG PO TABS
325.0000 mg | ORAL_TABLET | Freq: Every day | ORAL | 2 refills | Status: AC
  Filled 2024-06-17: qty 30, 30d supply, fill #0

## 2024-06-17 MED ORDER — LIDOCAINE 4 % EX CREA
TOPICAL_CREAM | Freq: Two times a day (BID) | CUTANEOUS | 0 refills | Status: AC | PRN
  Filled 2024-06-17: qty 30, fill #0

## 2024-06-17 MED ORDER — METOPROLOL SUCCINATE ER 100 MG PO TB24
100.0000 mg | ORAL_TABLET | Freq: Every day | ORAL | 1 refills | Status: DC
  Filled 2024-06-17: qty 30, 30d supply, fill #0

## 2024-06-17 MED ORDER — CYANOCOBALAMIN 1000 MCG PO TABS
1000.0000 ug | ORAL_TABLET | Freq: Every day | ORAL | 2 refills | Status: AC
  Filled 2024-06-17: qty 30, 30d supply, fill #0

## 2024-06-17 MED ORDER — LEVALBUTEROL TARTRATE 45 MCG/ACT IN AERO
INHALATION_SPRAY | RESPIRATORY_TRACT | 1 refills | Status: DC
  Filled 2024-06-17: qty 15, 30d supply, fill #0

## 2024-06-17 MED ORDER — CAMPHOR-MENTHOL 0.5-0.5 % EX LOTN
TOPICAL_LOTION | CUTANEOUS | 0 refills | Status: AC | PRN
  Filled 2024-06-17: qty 222, fill #0

## 2024-06-17 MED ORDER — OXYCODONE HCL 5 MG PO TABS: 5.0000 mg | ORAL_TABLET | ORAL | 0 refills | Status: AC | PRN

## 2024-06-17 MED ORDER — SPIRONOLACTONE 50 MG PO TABS
50.0000 mg | ORAL_TABLET | Freq: Every day | ORAL | 1 refills | Status: AC
  Filled 2024-06-17: qty 30, 30d supply, fill #0

## 2024-06-17 MED ORDER — CETIRIZINE HCL 10 MG PO TABS
10.0000 mg | ORAL_TABLET | Freq: Every day | ORAL | 1 refills | Status: AC
  Filled 2024-06-17: qty 30, 30d supply, fill #0

## 2024-06-17 MED ORDER — OXYCODONE HCL 5 MG PO TABS
5.0000 mg | ORAL_TABLET | ORAL | 0 refills | Status: DC | PRN
  Filled 2024-06-17: qty 30, 3d supply, fill #0

## 2024-06-17 MED ORDER — FERROUS SULFATE 325 (65 FE) MG PO TBEC
325.0000 mg | DELAYED_RELEASE_TABLET | Freq: Every day | ORAL | Status: DC
  Filled 2024-06-17: qty 1

## 2024-06-17 MED ORDER — TORSEMIDE 20 MG PO TABS
20.0000 mg | ORAL_TABLET | Freq: Every day | ORAL | 0 refills | Status: DC
  Filled 2024-06-17: qty 30, 30d supply, fill #0

## 2024-06-17 NOTE — Progress Notes (Signed)
 AVS given to patient and explained at the bedside. Medications and follow up appointments have been explained with pt verbalizing understanding.

## 2024-06-17 NOTE — Progress Notes (Signed)
 Mobility Specialist Progress Note:  RA Pre-Mobility: 93% SPO2 During Mobility: 91-92% SPO2 Post-Mobility: 94% SPO2  06/17/24 1143  Mobility  Activity Ambulated independently  Level of Assistance Independent  Assistive Device None  Distance Ambulated (ft) 300 ft  Activity Response Tolerated well  Mobility Referral Yes  Mobility visit 1 Mobility  Mobility Specialist Start Time (ACUTE ONLY) 1112  Mobility Specialist Stop Time (ACUTE ONLY) 1123  Mobility Specialist Time Calculation (min) (ACUTE ONLY) 11 min   Pt was received in bed and agreed to mobility. No complaints during ambulation. Returned to bed with all needs met. Left in room with family.  [employer] - Mobility Specialist

## 2024-06-17 NOTE — Discharge Summary (Signed)
 Physician Discharge Summary  Patient ID: [name] MRN: [id number] DOB/AGE: September 09, 1985 38 y.o.  Admit date: 06/12/2024 Discharge date: 06/17/2024  Admission Diagnoses:  Discharge Diagnoses:  Principal Problem:   Acute respiratory failure with hypoxia [facility])   Discharged Condition: stable  Hospital Course: Patient is a 38 year old female with history of plaque psoriasis, generalized anxiety, morbid obesity, HFpEF, chronic normocytic anemia who presented with 2-day history of cough, shortness of breath.  Required 2 to 3 L of oxygen  per minute on presentation.  CT angiogram did not show any PE but showed patchy and nodular airspace disease in both upper lungs with some peripheral sparing consistent with multifocal pneumonia.  Patient was started on antibiotics.  Clinically improving but still requiring 2 to 3 L of oxygen  per minute.    Acute hypoxic respiratory failure:  -Presented with shortness of breath, cough.  Requiring 2  L of oxygen  per minute.  Not on oxygen  at home.  Likely from pneumonia or mild volume overload.  Continue to wean oxygen .  Continue bronchodilators as needed.  Continue current medications.  Continue mucolytics 06/16/2024: Gradually wean patient off supplemental oxygen .  Keep O2 sat greater than 91%.  Assess need for home oxygen  prior to discharge.   Acute on chronic HFpEF: Elevated BNP on presentation.  2D echo showed EF more than 75%, no regional wall motion abnormality, mild left ventricular hypertrophy, grade 1 left ventricular diastolic dysfunction.  Will give additional 2 doses of IV Lasix  40 mg  today.  Might need oral Lasix  at home.  She will follow-up with CHF team as an outpatient.Has an appointment already   Community-acquired pneumonia:  -No fever or leukocytosis.   -CT imaging suggestive of multifocal pneumonia.  However, see below, high resolution CT Chest done in November 2021. -Patient is afebrile, no leukocytosis.   -Negative procalcitonin.    -Dupixent  was restarted recently.  Check CBC with differential (rule out eosinophilia).  -Patient has history of COVID in 2021.  Patient will follow up with pulmonary team on discharge (see below)   High-resolution CT chest without contrast done on 05/07/2020 revealed: minimal patchy ground-glass opacities throughout both lungs without significant regions of reticulation, bronchiectasis or distortion. 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 Consider follow-up high-resolution chest CT study in 12 months to assess temporal pattern stability, as clinically warranted.   Chronic normocytic anemia/vitamin B12 deficiency:  -History of menorrhagia in the past.   -Hemoglobin of 7.5 g/dL today. - Recent iron  panel revealed iron  of 65, TSAT of 10%, TIBC of 636.  Likely from blood loss (history of menorrhagia noted). - Folate of 8.5.  - Vitamin B12 of 243.   -Start patient on Ferrous sulfate   -Continue vitamin B12 -Pelvic ultrasound on discharge to rule out uterine fibroid   Hypertension:  -Controlled     Hypokalemia:  -Potassium of 3.5 today.   Morbid obesity:  BMI of 52.7 Diet and exercise PCP to consider Coram Ozempic or Mounjaro (ie if not contra indicated)   History of psoriasis:  Follows with dermatologist.  On dupilumab    Deconditioning/weakness:  -PT consulted    Consults: None  Significant Diagnostic Studies:  CT ANGIOGRAPHY CHEST WITH CONTRAST: 1. No CT evidence for acute pulmonary embolus. Assessment of subsegmental pulmonary arteries to the lower lobes bilaterally is degraded by bolus timing and breathing motion. 2. Patchy and nodular airspace disease in both upper lungs with some peripheral sparing. Similar but less advanced patchy and nodular airspace disease is  seen in the right middle and both lower lobes. Imaging features are compatible with multifocal pneumonia. While metastatic disease could have this  appearance, patient age and disease distribution favors infectious/inflammatory etiology 3. Cholelithiasis.     Electronically Signed   By: Camellia Candle M.D.   On: 06/12/2024 11:42    Discharge Exam: Blood pressure 136/86, pulse 74, temperature 99.2 F (37.3 C), temperature source Oral, resp. rate 18, height 5' 9 (1.753 m), weight (!) 162 kg, last menstrual period 05/13/2024, SpO2 93%.   Disposition: Discharge disposition: 01-Home or Self Care       Discharge Instructions     Increase activity slowly   Complete by: As directed       Allergies as of 06/17/2024       Reactions   Lyrica  [pregabalin ] Swelling, Rash, Other (See Comments)   Swelling is in the feet        Medication List     STOP taking these medications    ACCRUFeR  30 MG Caps Generic drug: Ferric Maltol    doxepin 50 MG capsule Commonly known as: SINEQUAN   folic acid  1 MG tablet Commonly known as: FOLVITE    HYDROcodone  Bitartrate ER 15 MG Cp12   levocetirizine 5 MG tablet Commonly known as: XYZAL   nebivolol  10 MG tablet Commonly known as: BYSTOLIC    potassium chloride  10 MEQ tablet Commonly known as: Klor-Con  M10   prazosin 2 MG capsule Commonly known as: MINIPRESS   zinc  gluconate 50 MG tablet       TAKE these medications    camphor-menthol  lotion Commonly known as: SARNA Apply topically as needed for itching.   cetirizine  10 MG tablet Commonly known as: ZYRTEC  Take 1 tablet (10 mg total) by mouth daily. Start taking on: June 18, 2024   cyanocobalamin  1000 MCG tablet Take 1 tablet (1,000 mcg total) by mouth daily. Start taking on: June 18, 2024   DULoxetine  20 MG capsule Commonly known as: CYMBALTA  Take 20 mg by mouth daily as needed.   Dupixent  300 MG/2ML Soaj Generic drug: Dupilumab  Inject 300 mg into the skin every 14 (fourteen) days.   ferrous sulfate  325 (65 FE) MG tablet Take 1 tablet (325 mg total) by mouth daily with breakfast. Start taking  on: June 18, 2024   levalbuterol  45 MCG/ACT inhaler Commonly known as: XOPENEX  HFA INHALE 2 PUFFS BY MOUTH INTO THE LUNGS EVERY 6 HOURS AS NEEDED FOR WHEEZE. Patient will need to follow up with PCP for future refills   lidocaine  4 % cream Commonly known as: LMX Apply topically 2 (two) times daily as needed (plaque psoriasis flare pain).   metoprolol  succinate 100 MG 24 hr tablet Commonly known as: Toprol  XL Take 1 tablet (100 mg total) by mouth daily. Take with or immediately following a meal.   oxyCODONE  5 MG immediate release tablet Commonly known as: Oxy IR/ROXICODONE  Take 1-2 tablets (5-10 mg total) by mouth every 4 (four) hours as needed for moderate pain (pain score 4-6) or severe pain (pain score 7-10).   spironolactone  50 MG tablet Commonly known as: Aldactone  Take 1 tablet (50 mg total) by mouth daily.   torsemide  20 MG tablet Commonly known as: DEMADEX  Take 1 tablet (20 mg total) by mouth daily.   traZODone  50 MG tablet Commonly known as: DESYREL  Take 0.5 tablets (25 mg total) by mouth at bedtime as needed for sleep. What changed:  medication strength how much to take   Vitamin D  (Ergocalciferol ) 1.25 MG (50000 UNIT) Caps  capsule Commonly known as: DRISDOL  Take 1 capsule (50,000 Units total) by mouth every 7 (seven) days.        Follow-up Information     [facility]. Go in 7 day(s).   Specialty: Cardiology Why: Hospital follow up 06/21/2024 @ 9 am PLEASE bring a current medication list to appointment FREE valet parking, Entrance C, off [address] [facility] entrance Contact information: [address] [place]  [phone]        GLP-1 referral. Schedule an appointment as soon as possible for a visit.   Why: Follow up with PCP about GLP-1 recommended by Attending               Time spent: 35 minutes.  SignedBETHA Leatrice LILLETTE Rosario 06/17/2024, 3:32 PM

## 2024-06-17 NOTE — Progress Notes (Signed)
 Discharge meds in a secure bag delivered to patient in room by this RN

## 2024-06-18 ENCOUNTER — Other Ambulatory Visit (HOSPITAL_COMMUNITY): Payer: Self-pay

## 2024-06-19 ENCOUNTER — Other Ambulatory Visit: Payer: Self-pay | Admitting: Internal Medicine

## 2024-06-19 ENCOUNTER — Telehealth: Payer: Self-pay | Admitting: *Deleted

## 2024-06-19 DIAGNOSIS — J4489 Other specified chronic obstructive pulmonary disease: Secondary | ICD-10-CM

## 2024-06-19 NOTE — Transitions of Care (Post Inpatient/ED Visit) (Signed)
° °  06/19/2024  Name: [name] MRN: [id number] DOB: 1986/04/24  Today's TOC FU Call Status: Today's TOC FU Call Status:: Unsuccessful Call (1st Attempt) Unsuccessful Call (1st Attempt) Date: 06/19/24  Attempted to reach the patient regarding the most recent Inpatient visit.  Left HIPAA compliant voice message   Follow Up Plan: Additional outreach attempts will be made to reach the patient to complete the Transitions of Care (Post Inpatient/ED visit) call.   Pls call/ message for questions,  Malayjah Otoole Mckinney Terez Freimark, RN, BSN, CCRN Alumnus RN Care Manager  Transitions of Care  [facility] - [facility] [phone]: direct office

## 2024-06-19 NOTE — Telephone Encounter (Signed)
 Copied from CRM #[id number]. Topic: Clinical - Medication Refill >> Jun 19, 2024  9:08 AM Vena HERO wrote: Medication: levalbuterol  (XOPENEX  HFA) 45 MCG/ACT inhaler  Has the patient contacted their pharmacy? No (Agent: If no, request that the patient contact the pharmacy for the refill. If patient does not wish to contact the pharmacy document the reason why and proceed with request.) (Agent: If yes, when and what did the pharmacy advise?) Call provider  This is the patient's preferred pharmacy:  [facility] [phone] [facility], [place] - [address] [place] [address] Phone: [phone] Fax: [phone]  Is this the correct pharmacy for this prescription? Yes If no, delete pharmacy and type the correct one.   Has the prescription been filled recently? No  Is the patient out of the medication? Yes  Has the patient been seen for an appointment in the last year OR does the patient have an upcoming appointment? Yes, next appt 07/19/24 on wait list  Can we respond through MyChart? Yes  Agent: Please be advised that Rx refills may take up to 3 business days. We ask that you follow-up with your pharmacy.

## 2024-06-19 NOTE — Progress Notes (Signed)
 HEART & VASCULAR TRANSITION OF CARE CONSULT NOTE   Referring Physician: PCP: Joshua Debby CROME, MD  Cardiologist: None  HPI: Referred to clinic by *** for heart failure consultation.   [name] is a 38 y.o. female with history of HTN, anemia, plaque psoriasis, anxiety, obesity, and HFpEF.   She was diagnosed with HFpEF in 9/24, managed by PCP. She presented with progressive cough shortness of breath, hypertension, and hypoxia. CTPE negative, although was consistent with possible PNA. Echo showed EF >75%, G1DD, nl RV. She was diuresed and started on torsemide , spirolactone, and toprol  XL   Today {he/she/they:23295} presents for transition of care visit. Overall feeling ***. NYHA ***. Reports {Symptoms; cardiac:12860::dyspnea,fatigue}. Denies {Symptoms; cardiac:12860::chest pain,dyspnea,fatigue,near-syncope,orthopnea,palpitations,dizziness,abnormal bleeding}. Able to perform ADLs. Appetite okay. Weight at home ***. BP at home***. Compliant with all medications. Denies ETOH, tobacco, or drug use.    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      Current Outpatient Medications  Medication Sig Dispense Refill   camphor-menthol  (SARNA) lotion Apply topically as needed for itching. 222 mL 0   cetirizine  (ZYRTEC ) 10 MG tablet Take 1 tablet (10 mg total) by mouth daily. 30 tablet 1   cyanocobalamin  1000 MCG tablet Take 1 tablet (1,000 mcg total) by mouth daily. 30 tablet 2   DULoxetine  (CYMBALTA ) 20 MG capsule Take 20 mg by mouth daily as needed.      Dupilumab  (DUPIXENT ) 300 MG/2ML SOAJ Inject 300 mg into the skin every 14 (fourteen) days.     ferrous sulfate  325 (65 FE) MG tablet Take 1 tablet (325 mg total) by mouth daily with breakfast. 30 tablet 2   levalbuterol  (XOPENEX  HFA) 45 MCG/ACT inhaler INHALE 2 PUFFS BY MOUTH INTO THE LUNGS EVERY 6 HOURS AS NEEDED FOR WHEEZE. Patient will need to follow up with PCP for future refills 15 g 1   lidocaine  (LMX) 4 % cream Apply topically 2 (two) times daily as needed (plaque psoriasis flare pain). 30 g 0   metoprolol  succinate (TOPROL  XL) 100 MG 24 hr tablet Take 1 tablet (100 mg total) by mouth daily. Take with or immediately following a meal. 30 tablet 1   oxyCODONE  (OXY IR/ROXICODONE ) 5 MG immediate release tablet Take 1-2 tablets (5-10 mg total) by mouth every 4 (four) hours as needed for moderate pain (pain score 4-6) or severe pain (pain score 7-10). 30 tablet 0   spironolactone  (ALDACTONE ) 50 MG tablet Take 1 tablet (50 mg total) by mouth daily. 30 tablet 1   torsemide  (DEMADEX ) 20 MG tablet Take 1 tablet (20 mg total) by mouth daily. 30 tablet 0   traZODone  (DESYREL ) 50 MG tablet Take 0.5 tablets (25 mg total) by mouth at bedtime as needed for sleep. 30 tablet 1   Vitamin D , Ergocalciferol , (DRISDOL ) 1.25 MG (50000 UNIT) CAPS capsule Take 1 capsule (50,000 Units total) by mouth every 7 (seven) days. 12 capsule 0   No current facility-administered medications for this visit.    Allergies[1]  Social History   Socioeconomic History  Marital status: Single    Spouse name: Not on file   Number of children: 2   Years of education: Not on file   Highest education level: Some college, no degree  Occupational History   Not on file  Tobacco Use   Smoking status: Never    Passive exposure: Never   Smokeless tobacco: Never  Vaping Use   Vaping status: Former  Substance and Sexual Activity   Alcohol use: Not Currently    Comment: socially   Drug use: No   Sexual activity: Not  Currently    Partners: Male    Birth control/protection: Pill  Other Topics Concern   Not on file  Social History Narrative   Are you right handed or left handed? Right Handed   Are you currently employed ? Yes   What is your current occupation? [employer]   Do you live at home alone? No   Who lives with you? Children    What type of home do you live in: 1 story or 2 story? Lives in two story home       Social Drivers of Health   Tobacco Use: Low Risk (06/12/2024)   Patient History    Smoking Tobacco Use: Never    Smokeless Tobacco Use: Never    Passive Exposure: Never  Financial Resource Strain: Low Risk (06/14/2024)   Overall Financial Resource Strain (CARDIA)    Difficulty of Paying Living Expenses: Not very hard  Food Insecurity: No Food Insecurity (06/12/2024)   Epic    Worried About Programme Researcher, Broadcasting/film/video in the Last Year: Never true    Ran Out of Food in the Last Year: Never true  Transportation Needs: No Transportation Needs (06/14/2024)   Epic    Lack of Transportation (Medical): No    Lack of Transportation (Non-Medical): No  Physical Activity: Unknown (02/01/2023)   Exercise Vital Sign    Days of Exercise per Week: 0 days    Minutes of Exercise per Session: Not on file  Stress: Stress Concern Present (02/01/2023)   [employer] of Occupational Health - Occupational Stress Questionnaire    Feeling of Stress : Very much  Social Connections: Patient Declined (06/12/2024)   Social Connection and Isolation Panel    Frequency of Communication with Friends and Family: Patient declined    Frequency of Social Gatherings with Friends and Family: Patient declined    Attends Religious Services: Patient declined    Active Member of Clubs or Organizations: Patient declined    Attends Banker Meetings: Patient declined    Marital Status: Patient declined  Intimate Partner Violence: Not At Risk (06/12/2024)   Epic    Fear of Current or Ex-Partner: No     Emotionally Abused: No    Physically Abused: No    Sexually Abused: No  Depression (PHQ2-9): High Risk (07/05/2023)   Depression (PHQ2-9)    PHQ-2 Score: 11  Alcohol Screen: Low Risk (06/14/2024)   Alcohol Screen    Last Alcohol Screening Score (AUDIT): 1  Housing: Unknown (06/14/2024)   Epic    Unable to Pay for Housing in the Last Year: No    Number of Times Moved in the Last Year: Not on file    Homeless in the Last Year: No  Utilities: Not At Risk (06/12/2024)   Epic    Threatened with loss of utilities: No  Health Literacy: Not on file    Family History  Problem Relation Age of Onset  Breast cancer Maternal Grandmother 40   Hypertension Father    Hypertension Mother    Asthma Brother    Alcohol abuse Neg Hx    Arthritis Neg Hx    Cancer Neg Hx    Early death Neg Hx    Heart disease Neg Hx    Hyperlipidemia Neg Hx    Kidney disease Neg Hx    Stroke Neg Hx     There were no vitals filed for this visit.  There were no vitals filed for this visit.  PHYSICAL EXAM: General: *** appearing. No distress  Cardiac: JVP ~*** cm. No murmurs  Resp: Lung sounds clear and equal B/L Abdomen: Soft, non-distended.  Extremities: Warm and dry.  *** edema.  Neuro: A&O x3. Affect pleasant.   ECG (personally reviewed):  ASSESSMENT & PLAN:  Chronic HFpEF - Diagnoses in 2024. Echo 12/25 with EF >70%, G1DD - High output heart failure, likely related to obesity, although would benefit from repeat sleep study and PFTs. - NYHA ***. *** - continue *** - GDMT: ? blocker: toprol  XL 100 mg daily *** decrease (no other indication) ARB/ARNI: MRA: continue spirolactone 50 mg daily SGLT2i:    Referred to HFSW (PCP, Medications, Transportation, ETOH Abuse, Drug Abuse, Insurance, Surveyor, Quantity ): {yes/no:20286} Refer to Pharmacy: {yes/no:20286} Refer to Home Health: {yes/no:20286} Refer to Advanced Heart Failure Clinic: {yes/no:20286}  Refer to General Cardiology:  {yes/no:20286}  Follow up with ***  Jacquez Sheetz, NP 06/19/2024     [1]  Allergies Allergen Reactions   Lyrica  [Pregabalin ] Swelling, Rash and Other (See Comments)    Swelling is in the feet

## 2024-06-20 ENCOUNTER — Telehealth: Payer: Self-pay

## 2024-06-20 ENCOUNTER — Telehealth (HOSPITAL_COMMUNITY): Payer: Self-pay

## 2024-06-20 NOTE — Transitions of Care (Post Inpatient/ED Visit) (Signed)
° °  06/20/2024  Name: [name] MRN: [id number] DOB: January 27, 1986  Today's TOC FU Call Status: Today's TOC FU Call Status:: Unsuccessful Call (2nd Attempt) Unsuccessful Call (2nd Attempt) Date: 06/20/24  Attempted to reach the patient regarding the most recent Inpatient/ED visit.  Follow Up Plan: Additional outreach attempts will be made to reach the patient to complete the Transitions of Care (Post Inpatient/ED visit) call.   Alaura Schippers J. Quincey Quesinberry RN, MSN [facility], [facility] RN Care Manager Direct Dial: [phone]  Fax: [phone] Website: [email or web address]

## 2024-06-20 NOTE — Telephone Encounter (Signed)
 Called to confirm/remind patient of their appointment at the Advanced Heart Failure Clinic on 06/21/2024.   Appointment:   [x] Confirmed  [] Left mess   [] No answer/No voice mail  [] VM Full/unable to leave message  [] Phone not in service  Patient reminded to bring all medications and/or complete list.  Confirmed patient has transportation. Gave directions, instructed to utilize valet parking.

## 2024-06-21 ENCOUNTER — Telehealth (HOSPITAL_COMMUNITY): Payer: Self-pay

## 2024-06-21 ENCOUNTER — Ambulatory Visit: Admitting: Emergency Medicine

## 2024-06-21 ENCOUNTER — Other Ambulatory Visit (HOSPITAL_COMMUNITY): Payer: Self-pay

## 2024-06-21 ENCOUNTER — Encounter (HOSPITAL_COMMUNITY): Payer: Self-pay

## 2024-06-21 ENCOUNTER — Ambulatory Visit (HOSPITAL_COMMUNITY): Payer: Self-pay | Admitting: Cardiology

## 2024-06-21 ENCOUNTER — Telehealth: Payer: Self-pay

## 2024-06-21 ENCOUNTER — Encounter: Payer: Self-pay | Admitting: Emergency Medicine

## 2024-06-21 ENCOUNTER — Ambulatory Visit (HOSPITAL_COMMUNITY): Admit: 2024-06-21 | Discharge: 2024-06-21 | Attending: Cardiology

## 2024-06-21 VITALS — BP 140/80 | HR 79 | Temp 97.5°F | Ht 70.0 in | Wt 343.0 lb

## 2024-06-21 VITALS — BP 142/82 | HR 84 | Ht 70.0 in | Wt 344.2 lb

## 2024-06-21 DIAGNOSIS — E66813 Obesity, class 3: Secondary | ICD-10-CM

## 2024-06-21 DIAGNOSIS — L400 Psoriasis vulgaris: Secondary | ICD-10-CM | POA: Insufficient documentation

## 2024-06-21 DIAGNOSIS — D649 Anemia, unspecified: Secondary | ICD-10-CM | POA: Diagnosis not present

## 2024-06-21 DIAGNOSIS — Z8249 Family history of ischemic heart disease and other diseases of the circulatory system: Secondary | ICD-10-CM | POA: Insufficient documentation

## 2024-06-21 DIAGNOSIS — J4489 Other specified chronic obstructive pulmonary disease: Secondary | ICD-10-CM

## 2024-06-21 DIAGNOSIS — I5032 Chronic diastolic (congestive) heart failure: Secondary | ICD-10-CM | POA: Insufficient documentation

## 2024-06-21 DIAGNOSIS — Z83438 Family history of other disorder of lipoprotein metabolism and other lipidemia: Secondary | ICD-10-CM | POA: Diagnosis not present

## 2024-06-21 DIAGNOSIS — F419 Anxiety disorder, unspecified: Secondary | ICD-10-CM | POA: Insufficient documentation

## 2024-06-21 DIAGNOSIS — Z6841 Body Mass Index (BMI) 40.0 and over, adult: Secondary | ICD-10-CM | POA: Insufficient documentation

## 2024-06-21 DIAGNOSIS — R0683 Snoring: Secondary | ICD-10-CM | POA: Insufficient documentation

## 2024-06-21 DIAGNOSIS — E669 Obesity, unspecified: Secondary | ICD-10-CM | POA: Diagnosis not present

## 2024-06-21 DIAGNOSIS — I110 Hypertensive heart disease with heart failure: Secondary | ICD-10-CM | POA: Diagnosis not present

## 2024-06-21 DIAGNOSIS — G4700 Insomnia, unspecified: Secondary | ICD-10-CM | POA: Diagnosis not present

## 2024-06-21 DIAGNOSIS — F411 Generalized anxiety disorder: Secondary | ICD-10-CM | POA: Diagnosis not present

## 2024-06-21 DIAGNOSIS — Z09 Encounter for follow-up examination after completed treatment for conditions other than malignant neoplasm: Secondary | ICD-10-CM

## 2024-06-21 DIAGNOSIS — I5030 Unspecified diastolic (congestive) heart failure: Secondary | ICD-10-CM | POA: Insufficient documentation

## 2024-06-21 DIAGNOSIS — F339 Major depressive disorder, recurrent, unspecified: Secondary | ICD-10-CM | POA: Diagnosis not present

## 2024-06-21 LAB — LIPID PANEL
Cholesterol: 157 mg/dL (ref 0–200)
HDL: 42 mg/dL (ref >40)
LDL Cholesterol: 81 mg/dL (ref 0–99)
Total CHOL/HDL Ratio: 3.8 ratio
Triglycerides: 174 mg/dL — ABNORMAL HIGH (ref <150)
VLDL: 35 mg/dL (ref 0–40)

## 2024-06-21 LAB — BASIC METABOLIC PANEL WITH GFR
Anion gap: 12 (ref 5–15)
BUN: 8 mg/dL (ref 6–20)
CO2: 27 mmol/L (ref 22–32)
Calcium: 9.3 mg/dL (ref 8.9–10.3)
Chloride: 101 mmol/L (ref 98–111)
Creatinine, Ser: 0.63 mg/dL (ref 0.44–1.00)
GFR, Estimated: >60 mL/min (ref >60)
Glucose, Bld: 85 mg/dL (ref 70–99)
Potassium: 3.8 mmol/L (ref 3.5–5.1)
Sodium: 140 mmol/L (ref 135–145)

## 2024-06-21 LAB — PRO BRAIN NATRIURETIC PEPTIDE: Pro Brain Natriuretic Peptide: 635 pg/mL — ABNORMAL HIGH (ref <300.0)

## 2024-06-21 LAB — HEMOGLOBIN A1C
Hgb A1c MFr Bld: 5 % (ref 4.8–5.6)
Mean Plasma Glucose: 96.8 mg/dL

## 2024-06-21 MED ORDER — METOPROLOL SUCCINATE ER 50 MG PO TB24: 50.0000 mg | ORAL_TABLET | Freq: Every day | ORAL | 5 refills | Status: AC

## 2024-06-21 MED ORDER — OXYCODONE HCL 5 MG PO TABS: 5.0000 mg | ORAL_TABLET | Freq: Four times a day (QID) | ORAL | 0 refills | Status: DC | PRN

## 2024-06-21 MED ORDER — LEVALBUTEROL TARTRATE 45 MCG/ACT IN AERO: INHALATION_SPRAY | RESPIRATORY_TRACT | 1 refills | Status: DC

## 2024-06-21 NOTE — Progress Notes (Signed)
 [name] 38 y.o.   Chief Complaint  Patient presents with   Follow-up    Patient was being treated for her psoriasis pain and was needing more oxycodone  to help with her pain she only have two pills left     HISTORY OF PRESENT ILLNESS: Here for hospital discharge follow-up.  Patient of Dr. Debby Molt. This is a 38 y.o. female recently admitted with congestive heart failure Here for follow-up.  Requesting additional prescription for OxyContin  that was given upon discharge to treat psoriasis pain. Was able to follow-up with cardiologist this morning Cardiologist assessment and plan as follows:  ASSESSMENT & PLAN:   Chronic HFpEF - Diagnoses in 2024. Echo 12/25 with EF >70%, G1DD - High output heart failure, likely related to obesity, although would benefit from repeat sleep study and PFTs. - NYHA II. Mildly volume up to euvolemic, difficult on exam. BMET/BNP - continue torsemide  20 mg daily - GDMT: ? blocker: decrease toprol  XL to 50 mg daily (not indicated with HFpEF, no other indication) ARB/ARNI: not indicated, can add if hypertensive MRA: continue spirolactone 50 mg daily SGLT2i: would like to start, however farxiga/jardiance $400-500/month due to high deductible. Consult to SW for medication assistance.   Obesity  - Body mass index is 49.39 kg/m. - refer to PharmD for GLP1   Suspect OSA - snoring, poor sleep quality with frequent waking - refer to pulm for sleep study   Family history of HLD - check lipid panel, A1C (has not eaten yet today)   Referred to HFSW (Medication assistance): Yes Refer to Pharmacy: No Refer to Home Health: No Refer to [facility]: No  Refer to General Cardiology: Yes   Gifted pill box, pill splitter, and scale.    Follow up with [facility]. Referral and message sent to office.    Jordan Lee, NP 06/21/2024 HPI   Prior to Admission medications  Medication Sig Start Date End Date Taking?  Authorizing Provider  camphor-menthol  [facility]) lotion Apply topically as needed for itching. 06/17/24  Yes Rosario Leatrice FERNS, MD  cetirizine  (ZYRTEC ) 10 MG tablet Take 1 tablet (10 mg total) by mouth daily. 06/18/24  Yes Rosario Leatrice FERNS, MD  cyanocobalamin  1000 MCG tablet Take 1 tablet (1,000 mcg total) by mouth daily. 06/18/24  Yes Rosario Leatrice I, MD  DULoxetine  (CYMBALTA ) 20 MG capsule Take 20 mg by mouth daily as needed.   Yes [provider]  Dupilumab  (DUPIXENT ) 300 MG/2ML SOAJ Inject 300 mg into the skin every 14 (fourteen) days.   Yes [provider]  ferrous sulfate  325 (65 FE) MG tablet Take 1 tablet (325 mg total) by mouth daily with breakfast. 06/18/24  Yes Rosario Leatrice FERNS, MD  folic acid  (FOLVITE ) 1 MG tablet Take 1 mg by mouth daily.   Yes [provider]  lidocaine  (LMX) 4 % cream Apply topically 2 (two) times daily as needed (plaque psoriasis flare pain). 06/17/24  Yes Rosario Leatrice FERNS, MD  metoprolol  succinate (TOPROL  XL) 50 MG 24 hr tablet Take 1 tablet (50 mg total) by mouth daily. Take with or immediately following a meal. 06/21/24  Yes Lee, Jordan, NP  nebivolol  (BYSTOLIC ) 10 MG tablet Take 10 mg by mouth daily.   Yes [provider]  oxyCODONE  (OXY IR/ROXICODONE ) 5 MG immediate release tablet Take 1-2 tablets (5-10 mg total) by mouth every 4 (four) hours as needed for moderate pain (pain score 4-6) or severe pain (pain score 7-10). 06/17/24  Yes Ogbata,  Leatrice FERNS, MD  potassium chloride  (KLOR-CON  M) 10 MEQ tablet Take 10 mEq by mouth 2 (two) times daily.   Yes [provider]  prazosin (MINIPRESS) 1 MG capsule Take 1 mg by mouth at bedtime.   Yes [provider]  spironolactone  (ALDACTONE ) 50 MG tablet Take 1 tablet (50 mg total) by mouth daily. 06/17/24  Yes Rosario Leatrice FERNS, MD  torsemide  (DEMADEX ) 20 MG tablet Take 1 tablet (20 mg total) by mouth daily. 06/17/24 07/17/24 Yes Rosario Leatrice FERNS, MD   traZODone  (DESYREL ) 50 MG tablet Take 0.5 tablets (25 mg total) by mouth at bedtime as needed for sleep. 06/17/24  Yes Rosario Leatrice FERNS, MD  Vitamin D , Ergocalciferol , (DRISDOL ) 1.25 MG (50000 UNIT) CAPS capsule Take 1 capsule (50,000 Units total) by mouth every 7 (seven) days. 09/21/23  Yes Joshua Debby CROME, MD  levalbuterol  (XOPENEX  HFA) 45 MCG/ACT inhaler INHALE 2 PUFFS BY MOUTH INTO THE LUNGS EVERY 6 HOURS AS NEEDED FOR WHEEZE. Patient will need to follow up with PCP for future refills 06/21/24   Purcell Emil Schanz, MD  FLUoxetine  (PROZAC ) 10 MG tablet Take 1 tablet (10 mg total) by mouth daily. Patient not taking: Reported on 02/01/2019 03/17/18 02/01/19  Joshua Debby CROME, MD  phentermine  37.5 MG capsule Take 1 capsule (37.5 mg total) by mouth every morning. 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   Screening for cervical cancer 02/03/2023   Encounter for initial prescription of contraceptives 02/03/2023   Intrinsic eczema 07/25/2022   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   High serum parathyroid hormone (PTH) 02/11/2022   GAD (generalized anxiety disorder) 10/27/2021   Hypokalemia 10/01/2021   Asthmatic bronchitis , chronic (HCC) 01/09/2021   Current severe episode of major depressive disorder without psychotic features without prior episode (HCC) 10/17/2020   Dietary folate deficiency anemia 04/11/2020   Primary hypertension 04/10/2020   Obstructive sleep  apnea syndrome 04/10/2020   PMS (premenstrual syndrome) 03/17/2018   Primary insomnia 07/28/2017   Morbid obesity with BMI of 50.0-59.9, adult (HCC) 07/28/2017   Chronic zinc  deficiency 06/25/2015   B12 deficiency 06/20/2015   Vitamin D  deficiency 06/20/2013   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      Past Surgical History:  Procedure Laterality Date   BREATH SHIRLEAN VEAR LORA  02/09/2012   Procedure: BREATH TEK VEAR LORA;  Surgeon: Donnice KATHEE Lunger, MD;  Location: [facility];  Service: General;  Laterality: N/A;  GASTRIC BYPASS  08/29/2012   HERNIA REPAIR  2014    Social History   Socioeconomic History   Marital status: Single    Spouse name: Not on file   Number of children: 2   Years of education: Not on file   Highest education level: Some college, no degree  Occupational History   Not on file  Tobacco Use   Smoking status: Never    Passive exposure: Never   Smokeless tobacco: Never  Vaping Use   Vaping status: Former  Substance and Sexual Activity   Alcohol use: Not Currently    Comment: socially   Drug use: No   Sexual activity: Not Currently    Partners: Male    Birth control/protection: Pill  Other Topics Concern   Not on file  Social History Narrative   Are you right handed or left handed? Right Handed   Are you currently employed ? Yes   What is your current occupation? [employer]   Do you live at home alone? No   Who lives  with you? Children    What type of home do you live in: 1 story or 2 story? Lives in two story home       Social Drivers of Health   Tobacco Use: Low Risk (06/21/2024)   Patient History    Smoking Tobacco Use: Never    Smokeless Tobacco Use: Never    Passive Exposure: Never  Financial Resource Strain: Low Risk (06/14/2024)   Overall Financial Resource Strain (CARDIA)    Difficulty of Paying Living Expenses: Not very hard  Food Insecurity: No Food Insecurity (06/12/2024)   Epic    Worried About Programme Researcher, Broadcasting/film/video in the Last Year: Never true    Ran Out of Food in the Last Year: Never true  Transportation Needs: No Transportation Needs (06/14/2024)   Epic    Lack of Transportation (Medical): No    Lack of Transportation (Non-Medical): No  Physical Activity: Unknown (02/01/2023)   Exercise Vital Sign    Days of Exercise per Week: 0 days    Minutes of Exercise per Session: Not on file  Stress: Stress Concern Present (02/01/2023)   [employer] of Occupational Health - Occupational Stress Questionnaire    Feeling of Stress : Very much  Social Connections: Patient Declined (06/12/2024)   Social Connection and Isolation Panel    Frequency of Communication with Friends and Family: Patient declined    Frequency of Social Gatherings with Friends and Family: Patient declined    Attends Religious Services: Patient declined    Active Member of Clubs or Organizations: Patient declined    Attends Banker Meetings: Patient declined    Marital Status: Patient declined  Intimate Partner Violence: Not At Risk (06/12/2024)   Epic    Fear of Current or Ex-Partner: No    Emotionally Abused: No    Physically Abused: No    Sexually Abused: No  Depression (PHQ2-9): Low Risk (06/21/2024)   Depression (PHQ2-9)    PHQ-2 Score: 0  Alcohol Screen: Low Risk (06/14/2024)   Alcohol Screen    Last Alcohol Screening Score (AUDIT): 1  Housing: Unknown (06/14/2024)   Epic    Unable to Pay  for Housing in the Last Year: No    Number of Times Moved in the Last Year: Not on file    Homeless in the Last Year: No  Utilities: Not At Risk (06/12/2024)   Epic    Threatened with  loss of utilities: No  Health Literacy: Not on file    Family History  Problem Relation Age of Onset   Breast cancer Maternal Grandmother 40   Hypertension Father    Hypertension Mother    Asthma Brother    Alcohol abuse Neg Hx    Arthritis Neg Hx    Cancer Neg Hx    Early death Neg Hx    Heart disease Neg Hx    Hyperlipidemia Neg Hx    Kidney disease Neg Hx    Stroke Neg Hx      Review of Systems  Constitutional: Negative.  Negative for chills and fever.  HENT: Negative.  Negative for congestion and sore throat.   Respiratory: Negative.  Negative for cough and shortness of breath.   Cardiovascular: Negative.  Negative for chest pain and palpitations.  Gastrointestinal:  Negative for abdominal pain, diarrhea, nausea and vomiting.  Genitourinary: Negative.  Negative for dysuria and hematuria.  Skin: Negative.  Negative for rash.  Neurological: Negative.  Negative for dizziness and headaches.  All other systems reviewed and are negative.   Vitals:   06/21/24 1041 06/21/24 1049  BP: (!) 140/80 (!) 140/80  Pulse: 79   Temp: (!) 97.5 F (36.4 C)     Physical Exam Vitals reviewed.  Constitutional:      Appearance: She is obese.  HENT:     Head: Normocephalic.     Mouth/Throat:     Mouth: Mucous membranes are moist.     Pharynx: Oropharynx is clear.  Eyes:     Extraocular Movements: Extraocular movements intact.     Pupils: Pupils are equal, round, and reactive to light.  Cardiovascular:     Rate and Rhythm: Normal rate and regular rhythm.     Pulses: Normal pulses.     Heart sounds: Normal heart sounds.  Pulmonary:     Effort: Pulmonary effort is normal.     Breath sounds: Normal breath sounds.  Musculoskeletal:     Cervical back: No tenderness.  Lymphadenopathy:      Cervical: No cervical adenopathy.  Skin:    General: Skin is warm and dry.     Capillary Refill: Capillary refill takes less than 2 seconds.  Neurological:     General: No focal deficit present.     Mental Status: She is alert and oriented to person, place, and time.  Psychiatric:        Mood and Affect: Mood normal.        Behavior: Behavior normal.      ASSESSMENT & PLAN: A total of 40 minutes was spent with the patient and counseling/coordination of care regarding preparing for this visit, review of most recent office visit notes, review of cardiologist office visit notes from earlier today, review of multiple chronic medical conditions and their management, review of all medications, review of most recent bloodwork results, review of health maintenance items, education on nutrition, prognosis, documentation, and need for follow up.   Problem List Items Addressed This Visit       Cardiovascular and Mediastinum   LVH (left ventricular hypertrophy) due to hypertensive disease, with heart failure (HCC)   BP Readings from Last 3 Encounters:  06/21/24 (!) 140/80  06/21/24 (!) 142/82  06/17/24 136/86  Presently on metoprolol  succinate 50 mg daily and Aldactone  50 mg daily Also on torsemide  20 mg daily        Chronic heart failure with preserved ejection fraction (HCC) - Primary   As per  cardiologist note this morning: Diagnoses in 2024. Echo 12/25 with EF >70%, G1DD - High output heart failure, likely related to obesity, although would benefit from repeat sleep study and PFTs. - NYHA II. Mildly volume up to euvolemic, difficult on exam. BMET/BNP - continue torsemide  20 mg daily - GDMT: ? blocker: decrease toprol  XL to 50 mg daily (not indicated with HFpEF, no other indication) ARB/ARNI: not indicated, can add if hypertensive MRA: continue spirolactone 50 mg daily SGLT2i: would like to start, however farxiga/jardiance $400-500/month due to high deductible. Consult to SW for  medication assistance.          Respiratory   Asthmatic bronchitis , chronic (HCC)   Clinically stable.  No concerns. Continues Xopenex  as needed      Relevant Medications   levalbuterol  (XOPENEX  HFA) 45 MCG/ACT inhaler     Other   Morbid obesity with BMI of 50.0-59.9, adult (HCC)   Diet and nutrition discussed Advised to decrease amount of daily carbohydrate intake and daily calories and increase amount of plant-based protein in her diet Plan is to get her started on GLP-1 agonist      Other Visit Diagnoses       Hospital discharge follow-up          Patient Instructions  Heart Failure: How to Manage Heart failure is a long-term condition where your heart can't pump enough blood through your body. When this happens, parts of your body don't get the blood and oxygen  they need. There's no cure for heart failure, so it's important to take good care of yourself and follow the treatment plan you set with your health care provider. If you're living with heart failure, there are ways to help you manage the disease. How to manage lifestyle changes Living with heart failure requires you to make changes in your daily life. Your health care team will teach you about the changes you need to make. These changes can help improve your symptoms and lower your risk of going to the hospital. Work with your provider to create a plan for you. Activity Ask your provider about cardiac rehabilitation programs. These programs include physical activity. If no cardiac rehab program is available, ask your provider what exercises are safe for you to do. Ask what things are safe for you to do at home. Ask when you can go back to work or school. Pace your daily activities and allow time for rest. Managing stress It's normal to have many emotions when you're told you have heart failure. You may have fear, sadness, and anger. If you need help coping with any of these emotions, let your provider know. Here  are some ways to help yourself manage these emotions: Talk to your friends and family about your condition. They can give you support and guidance. Explain your symptoms to them. If comfortable, you can invite them to attend appointments with you. Join a support group for people with heart failure. Talking with others who have the same symptoms may give you new ways of coping with your disease and your emotions. Accept help from others. Do not be ashamed if you need help. Use stress management techniques. Try things like meditation, breathing exercises, or listening to relaxing music. Conditions like depression and anxiety are common in people with heart failure. Pay attention to changes in your mood, emotions, and stress levels. Tell your provider if you have any of the following symptoms: Trouble sleeping or a change in your sleeping patterns. Feeling sad or  depressed. Losing interest in activities you normally enjoy. Feeling irritable or crying for no reason. Often worrying about the future. Work If heart failure affects your ability to work, you may need to talk with your provider about making a plan for changes. This may include: Reducing your work hours. Finding tasks that require less effort. Planning rest periods during work hours. Travel Talk with your provider if you plan to travel. There may be times when your provider suggests that you don't travel or you wait until your condition has improved. Bring your medicine and a list of your medicines. If you're traveling by public transportation (airplane, train, bus), keep your medicines with you in a carry-on bag. If you have special needs, contact the transportation company prior to traveling. This may include needs related to diet, oxygen , a wheelchair, a seating request, or help with luggage. Think about finding a medical facility in the area you'll be traveling to. Find out what your health insurance will cover. If you use oxygen ,  make sure you bring enough oxygen  with you. If you have a battery-powered oxygen  device, bring a fully charged extra battery with you. If you have an implanted device, bring a note from your provider and tell the security screening workers that you have the device. You may need to go through special screening. Sexual activity  Ask your provider when it's safe for you to resume sexual activity. You may need to start slowly and increase intimacy over time. Regular exercise can benefit your sex life by building strength and endurance. Sleep If your condition affects your sleep, find ways to improve how well you sleep at night. These tips may help: Sleep lying on your side, or sleep with your head raised. Try: Raising the head of your bed. Using more than one pillow. Ask your provider about screening for sleep apnea. Try to go to sleep and wake up at the same times every day. Sleep in a dark, cool room. Do not exercise or eat for a few hours before bedtime. Plan rest periods during the day. But don't take long naps.  Where to find support In addition to talking with your family or friends, consider talking with: A mental health professional or therapist. A member of your church, faith, or community group. Other sources of support include: Local support groups. Ask your provider about groups near you. Online support groups, such as those found through: Heart Failure Society of America: hfsa.org American Heart Association: supportnetwork.heart.org Local community agencies or social agencies. A palliative care specialist. Palliative care can help manage symptoms, promote comfort, improve quality of life, and maintain dignity. Where to find more information To learn more, go to these websites: Centers for Disease Control and Prevention at tonerpromos.no. Then: Click Health Topics. Type heart failure in the search box. American Heart Association: heart.org National Heart, Lung, and Blood  Institute: buffalodrycleaner.gl Contact a health care provider if: You have trouble breathing when you're active. You have swelling in your feet or legs. You gain 2-3 lb (0.9-1.4 kg) in 24 hours, or 5 lb (2.3 kg) in a week. You have a dry cough. You're not able to take part in your usual physical activities. You feel dizzy or light-headed when you stand up. You have trouble sleeping. You have a decrease in appetite. You have symptoms of depression or anxiety. Get help right away if: You have trouble breathing when resting. You have trouble staying awake or you feel confused. You have chest pain. You faint. You  have fast or irregular heartbeats. These symptoms may be an emergency. Call 911 right away. Do not wait to see if the symptoms will go away. Do not drive yourself to the hospital. This information is not intended to replace advice given to you by your health care provider. Make sure you discuss any questions you have with your health care provider. Document Revised: 02/04/2023 Document Reviewed: 02/04/2023 Elsevier Patient Education  2024 Elsevier Inc.     Emil Schaumann, MD [facility]    [1]  Allergies Allergen Reactions   Lyrica  [Pregabalin ] Swelling, Rash and Other (See Comments)    Swelling is in the feet

## 2024-06-21 NOTE — Patient Instructions (Addendum)
 Medication Changes:  DECREASE METOPROLOL  SUCCINATE TO 50MG  ONCE DAILY   Lab Work:  Labs done today, your results will be available in MyChart, we will contact you for abnormal readings.  Referrals:  YOU HAVE BEEN REFERRED TO PHARMACY FOR GLP-1 THEY WILL REACH OUT TO YOU OR CALL TO ARRANGE THIS. PLEASE CALL US  WITH ANY CONCERNS   YOU HAVE BEEN REFERRED TO GENERAL CARDIOLOGY  THEY WILL REACH OUT TO YOU OR CALL TO ARRANGE THIS. PLEASE CALL US  WITH ANY CONCERNS   YOU HAVE BEEN REFERRED TO PULMONOLOGY FOR SLEEP THEY WILL REACH OUT TO YOU OR CALL TO ARRANGE THIS. PLEASE CALL US  WITH ANY CONCERNS   Follow-Up in: WITH GENERAL CARDIOLOGY THEY WILL CALL TO SCHEDULE IF YOU NEED TO CALL THERE THE NUMBER IS [phone]  At the [facility], you and your health needs are our priority. We have a designated team specialized in the treatment of Heart Failure. This Care Team includes your primary Heart Failure Specialized Cardiologist (physician), Advanced Practice Providers (APPs- Physician Assistants and Nurse Practitioners), and Pharmacist who all work together to provide you with the care you need, when you need it.   You may see any of the following providers on your designated Care Team at your next follow up:  Dr. Toribio Fuel Dr. Ezra Shuck Dr. Odis Brownie Greig Mosses, NP Caffie Shed, [place] [facility], [place] Beckey Coe, NP Jordan Lee, NP Tinnie Redman, PharmD   Please be sure to bring in all your medications bottles to every appointment.   Need to Contact Us :  If you have any questions or concerns before your next appointment please send us  a message through [place] or call our office at [phone].    TO LEAVE A MESSAGE FOR THE NURSE SELECT OPTION 2, PLEASE LEAVE A MESSAGE INCLUDING: YOUR NAME DATE OF BIRTH CALL BACK NUMBER REASON FOR CALL**this is important as we prioritize the call backs  YOU WILL RECEIVE A CALL BACK THE SAME DAY AS  LONG AS YOU CALL BEFORE 4:00 PM

## 2024-06-21 NOTE — Assessment & Plan Note (Signed)
 Diet and nutrition discussed Advised to decrease amount of daily carbohydrate intake and daily calories and increase amount of plant-based protein in her diet Plan is to get her started on GLP-1 agonist

## 2024-06-21 NOTE — Assessment & Plan Note (Signed)
 As per cardiologist note this morning: Diagnoses in 2024. Echo 12/25 with EF >70%, G1DD - High output heart failure, likely related to obesity, although would benefit from repeat sleep study and PFTs. - NYHA II. Mildly volume up to euvolemic, difficult on exam. BMET/BNP - continue torsemide  20 mg daily - GDMT: ? blocker: decrease toprol  XL to 50 mg daily (not indicated with HFpEF, no other indication) ARB/ARNI: not indicated, can add if hypertensive MRA: continue spirolactone 50 mg daily SGLT2i: would like to start, however farxiga/jardiance $400-500/month due to high deductible. Consult to SW for medication assistance.

## 2024-06-21 NOTE — Patient Instructions (Signed)
 Heart Failure: How to Manage Heart failure is a long-term condition where your heart can't pump enough blood through your body. When this happens, parts of your body don't get the blood and oxygen they need. There's no cure for heart failure, so it's important to take good care of yourself and follow the treatment plan you set with your health care provider. If you're living with heart failure, there are ways to help you manage the disease. How to manage lifestyle changes Living with heart failure requires you to make changes in your daily life. Your health care team will teach you about the changes you need to make. These changes can help improve your symptoms and lower your risk of going to the hospital. Work with your provider to create a plan for you. Activity Ask your provider about cardiac rehabilitation programs. These programs include physical activity. If no cardiac rehab program is available, ask your provider what exercises are safe for you to do. Ask what things are safe for you to do at home. Ask when you can go back to work or school. Pace your daily activities and allow time for rest. Managing stress It's normal to have many emotions when you're told you have heart failure. You may have fear, sadness, and anger. If you need help coping with any of these emotions, let your provider know. Here are some ways to help yourself manage these emotions: Talk to your friends and family about your condition. They can give you support and guidance. Explain your symptoms to them. If comfortable, you can invite them to attend appointments with you. Join a support group for people with heart failure. Talking with others who have the same symptoms may give you new ways of coping with your disease and your emotions. Accept help from others. Do not be ashamed if you need help. Use stress management techniques. Try things like meditation, breathing exercises, or listening to relaxing music. Conditions  like depression and anxiety are common in people with heart failure. Pay attention to changes in your mood, emotions, and stress levels. Tell your provider if you have any of the following symptoms: Trouble sleeping or a change in your sleeping patterns. Feeling sad or depressed. Losing interest in activities you normally enjoy. Feeling irritable or crying for no reason. Often worrying about the future. Work If heart failure affects your ability to work, you may need to talk with your provider about making a plan for changes. This may include: Reducing your work hours. Finding tasks that require less effort. Planning rest periods during work hours. Travel Talk with your provider if you plan to travel. There may be times when your provider suggests that you don't travel or you wait until your condition has improved. Bring your medicine and a list of your medicines. If you're traveling by public transportation (airplane, train, bus), keep your medicines with you in a carry-on bag. If you have special needs, contact the transportation company prior to traveling. This may include needs related to diet, oxygen, a wheelchair, a seating request, or help with luggage. Think about finding a medical facility in the area you'll be traveling to. Find out what your health insurance will cover. If you use oxygen, make sure you bring enough oxygen with you. If you have a battery-powered oxygen device, bring a fully charged extra battery with you. If you have an implanted device, bring a note from your provider and tell the security screening workers that you have the device. You may  need to go through special screening. Sexual activity  Ask your provider when it's safe for you to resume sexual activity. You may need to start slowly and increase intimacy over time. Regular exercise can benefit your sex life by building strength and endurance. Sleep If your condition affects your sleep, find ways to improve  how well you sleep at night. These tips may help: Sleep lying on your side, or sleep with your head raised. Try: Raising the head of your bed. Using more than one pillow. Ask your provider about screening for sleep apnea. Try to go to sleep and wake up at the same times every day. Sleep in a dark, cool room. Do not exercise or eat for a few hours before bedtime. Plan rest periods during the day. But don't take long naps.  Where to find support In addition to talking with your family or friends, consider talking with: A mental health professional or therapist. A member of your church, faith, or community group. Other sources of support include: Local support groups. Ask your provider about groups near you. Online support groups, such as those found through: Heart Failure Society of America: hfsa.org American Heart Association: supportnetwork.heart.org Local community agencies or social agencies. A palliative care specialist. Palliative care can help manage symptoms, promote comfort, improve quality of life, and maintain dignity. Where to find more information To learn more, go to these websites: Centers for Disease Control and Prevention at TonerPromos.no. Then: Click Health Topics. Type "heart failure" in the search box. American Heart Association: heart.org National Heart, Lung, and Blood Institute: BuffaloDryCleaner.gl Contact a health care provider if: You have trouble breathing when you're active. You have swelling in your feet or legs. You gain 2-3 lb (0.9-1.4 kg) in 24 hours, or 5 lb (2.3 kg) in a week. You have a dry cough. You're not able to take part in your usual physical activities. You feel dizzy or light-headed when you stand up. You have trouble sleeping. You have a decrease in appetite. You have symptoms of depression or anxiety. Get help right away if: You have trouble breathing when resting. You have trouble staying awake or you feel confused. You have chest pain. You  faint. You have fast or irregular heartbeats. These symptoms may be an emergency. Call 911 right away. Do not wait to see if the symptoms will go away. Do not drive yourself to the hospital. This information is not intended to replace advice given to you by your health care provider. Make sure you discuss any questions you have with your health care provider. Document Revised: 02/04/2023 Document Reviewed: 02/04/2023 Elsevier Patient Education  2024 [employer].

## 2024-06-21 NOTE — Telephone Encounter (Signed)
 Advanced Heart Failure Patient Advocate Encounter  Test billing for this patient's current coverage ([employer]) returns a $591.04 copay for 30 day supply of Jardiance. This patient has an unmet deductible of [phone]. Patient would be eligible to use savings card, however Jardiance has a max benefit of $175 per month, so medication would likely still be unaffordable to patient.  Test billing for Dapagliflozin also returns pricing of $519.39 for 30 day supply, and as a generic, would not be eligible for savings card.  This test claim was processed through [facility]- copay amounts may vary at other pharmacies due to pharmacy/plan contracts, or as the patient moves through the different stages of their insurance plan.  Rachel DEL, CPhT Rx Patient Advocate Phone: [phone]

## 2024-06-21 NOTE — Assessment & Plan Note (Signed)
 Clinically stable.  No concerns. Continues Xopenex  as needed

## 2024-06-21 NOTE — Transitions of Care (Post Inpatient/ED Visit) (Signed)
° °  06/21/2024  Name: Echo Propp MRN: [id number] DOB: Feb 19, 1986  Today's TOC FU Call Status: Today's TOC FU Call Status:: Successful TOC FU Call Completed TOC FU Call Complete Date: 06/21/24  Patient's Name and Date of Birth confirmed. Name, DOB  Transition Care Management Follow-up Telephone Call How have you been since you were released from the hospital?: Better Any questions or concerns?: No  Items Reviewed: Did you receive and understand the discharge instructions provided?: Yes Medications obtained,verified, and reconciled?: Yes (Medications Reviewed)  Medications Reviewed Today: verbally reports that she has her medications. Declines review. Medications Reviewed Today   Medications were not reviewed in this encounter     Placed call to patient for TOC.. Reviewed with patient purpose of call.  She states that she understands her instructions. Reports she has all her medications and declines to complete call.    Alan Ee, RN, BSN, CEN [employer]- Transition of Care Team.  [facility] [phone]

## 2024-06-21 NOTE — Assessment & Plan Note (Signed)
 BP Readings from Last 3 Encounters:  06/21/24 (!) 140/80  06/21/24 (!) 142/82  06/17/24 136/86  Presently on metoprolol  succinate 50 mg daily and Aldactone  50 mg daily Also on torsemide  20 mg daily

## 2024-06-26 ENCOUNTER — Telehealth (HOSPITAL_COMMUNITY): Payer: Self-pay | Admitting: Licensed Clinical Social Worker

## 2024-06-26 NOTE — Telephone Encounter (Signed)
 CSW called pt to discuss possible med assistance- unable to reach- left VM requesting return call  Andriette HILARIO Leech, LCSW Clinical Social Worker [facility] Desk#: [phone] Cell#: [phone]

## 2024-07-03 ENCOUNTER — Other Ambulatory Visit (HOSPITAL_COMMUNITY): Payer: Self-pay

## 2024-07-03 ENCOUNTER — Telehealth (HOSPITAL_COMMUNITY): Payer: Self-pay | Admitting: Licensed Clinical Social Worker

## 2024-07-03 ENCOUNTER — Telehealth (HOSPITAL_COMMUNITY): Payer: Self-pay

## 2024-07-03 NOTE — Telephone Encounter (Signed)
 Advanced Heart Failure Patient Advocate Encounter  Review of patient chart for potential medication assistance. Patient has a HDHP that is not met causing all medications to be expensive / unaffordable.  Review of patient chart shows that pt is currently taking Dupixent . Dupixent  has a copay card that will cover up to $13,000 annual, which would help patient meet / exceed deductible limits.  Spoke to patient by phone, patient does currently have samples of Dupixent  provided but will contact prescribers office to see if prior authorization can be approved. If approved patient will be able to use copay savings card to meet deductible which should lower the price of all other current medications.  Patient expressed understanding.  Rachel DEL, CPhT Rx Patient Advocate Phone: [phone]

## 2024-07-03 NOTE — Telephone Encounter (Signed)
 H&V Care Navigation CSW Progress Note  Clinical Social Worker spoke with pt regarding med cost concerns.  Pt lives with dependent daughter and makes about $3000/month after taxes so is over limit for Medicaid at this time.  No other med assistance options CSW can refer to so informed patient advocate of ineligibility for medicaid so they could follow as appropriate or we could consult medical team for alternative medication options.  Andriette HILARIO Leech, LCSW Clinical Social Worker [facility] Desk#: [phone] Cell#: [phone]

## 2024-07-03 NOTE — Telephone Encounter (Signed)
 CSW attempted to call pt to discuss possible medicaid and medication assistance options.  Unable to reach- left message to request call back  Andriette HILARIO Leech, LCSW Clinical Social Worker [facility] Desk#: [phone] Cell#: [phone]

## 2024-07-10 ENCOUNTER — Encounter: Payer: Self-pay | Admitting: Internal Medicine

## 2024-07-10 ENCOUNTER — Ambulatory Visit: Admitting: Internal Medicine

## 2024-07-10 ENCOUNTER — Ambulatory Visit: Payer: Self-pay | Admitting: Internal Medicine

## 2024-07-10 VITALS — BP 160/96 | HR 87 | Temp 98.5°F | Resp 16 | Ht 70.0 in | Wt 343.2 lb

## 2024-07-10 DIAGNOSIS — D649 Anemia, unspecified: Secondary | ICD-10-CM

## 2024-07-10 DIAGNOSIS — D696 Thrombocytopenia, unspecified: Secondary | ICD-10-CM | POA: Diagnosis not present

## 2024-07-10 DIAGNOSIS — D510 Vitamin B12 deficiency anemia due to intrinsic factor deficiency: Secondary | ICD-10-CM

## 2024-07-10 DIAGNOSIS — I5032 Chronic diastolic (congestive) heart failure: Secondary | ICD-10-CM

## 2024-07-10 DIAGNOSIS — E5111 Dry beriberi: Secondary | ICD-10-CM

## 2024-07-10 DIAGNOSIS — E559 Vitamin D deficiency, unspecified: Secondary | ICD-10-CM | POA: Diagnosis not present

## 2024-07-10 DIAGNOSIS — Z9884 Bariatric surgery status: Secondary | ICD-10-CM | POA: Diagnosis not present

## 2024-07-10 DIAGNOSIS — I10 Essential (primary) hypertension: Secondary | ICD-10-CM

## 2024-07-10 DIAGNOSIS — Z23 Encounter for immunization: Secondary | ICD-10-CM

## 2024-07-10 DIAGNOSIS — G63 Polyneuropathy in diseases classified elsewhere: Secondary | ICD-10-CM | POA: Diagnosis not present

## 2024-07-10 LAB — CBC WITH DIFFERENTIAL/PLATELET
Basophils Absolute: 0 K/uL (ref 0.0–0.1)
Basophils Relative: 0.5 % (ref 0.0–3.0)
Eosinophils Absolute: 0.2 K/uL (ref 0.0–0.7)
Eosinophils Relative: 2.6 % (ref 0.0–5.0)
HCT: 33.1 % — ABNORMAL LOW (ref 36.0–46.0)
Hemoglobin: 9.8 g/dL — ABNORMAL LOW (ref 12.0–15.0)
Lymphocytes Relative: 28.5 % (ref 12.0–46.0)
Lymphs Abs: 1.9 K/uL (ref 0.7–4.0)
MCHC: 29.8 g/dL — ABNORMAL LOW (ref 30.0–36.0)
MCV: 71.5 fl — ABNORMAL LOW (ref 78.0–100.0)
Monocytes Absolute: 0.5 K/uL (ref 0.1–1.0)
Monocytes Relative: 7.3 % (ref 3.0–12.0)
Neutro Abs: 4.2 K/uL (ref 1.4–7.7)
Neutrophils Relative %: 61.1 % (ref 43.0–77.0)
Platelets: 200 K/uL (ref 150.0–400.0)
RBC: 4.62 Mil/uL (ref 3.87–5.11)
RDW: 26.5 % — ABNORMAL HIGH (ref 11.5–15.5)
WBC: 6.8 K/uL (ref 4.0–10.5)

## 2024-07-10 LAB — BASIC METABOLIC PANEL WITH GFR
BUN: 8 mg/dL (ref 6–23)
CO2: 24 meq/L (ref 19–32)
Calcium: 9 mg/dL (ref 8.4–10.5)
Chloride: 100 meq/L (ref 96–112)
Creatinine, Ser: 0.54 mg/dL (ref 0.40–1.20)
GFR: 116.31 mL/min
Glucose, Bld: 85 mg/dL (ref 70–99)
Potassium: 3.6 meq/L (ref 3.5–5.1)
Sodium: 135 meq/L (ref 135–145)

## 2024-07-10 LAB — VITAMIN D 25 HYDROXY (VIT D DEFICIENCY, FRACTURES): VITD: 17.87 ng/mL — ABNORMAL LOW (ref 30.00–100.00)

## 2024-07-10 MED ORDER — INSULIN PEN NEEDLE 32G X 6 MM MISC: 1.0000 | 0 refills | Status: AC

## 2024-07-10 MED ORDER — SEMAGLUTIDE-WEIGHT MANAGEMENT 1 MG/0.5ML ~~LOC~~ SOAJ: 1.0000 mg | SUBCUTANEOUS | 0 refills | Status: AC

## 2024-07-10 MED ORDER — TORSEMIDE 20 MG PO TABS: 20.0000 mg | ORAL_TABLET | Freq: Every day | ORAL | 0 refills | Status: AC

## 2024-07-10 MED ORDER — SEMAGLUTIDE-WEIGHT MANAGEMENT 1.7 MG/0.75ML ~~LOC~~ SOAJ: 1.7000 mg | SUBCUTANEOUS | 0 refills | Status: AC

## 2024-07-10 MED ORDER — SEMAGLUTIDE-WEIGHT MANAGEMENT 2.4 MG/0.75ML ~~LOC~~ SOAJ: 2.4000 mg | SUBCUTANEOUS | 0 refills | Status: AC

## 2024-07-10 MED ORDER — SEMAGLUTIDE-WEIGHT MANAGEMENT 0.5 MG/0.5ML ~~LOC~~ SOAJ: 0.5000 mg | SUBCUTANEOUS | 0 refills | Status: AC

## 2024-07-10 MED ORDER — SEMAGLUTIDE-WEIGHT MANAGEMENT 0.25 MG/0.5ML ~~LOC~~ SOAJ: 0.2500 mg | SUBCUTANEOUS | 0 refills | Status: AC

## 2024-07-10 MED ORDER — CHOLECALCIFEROL 1.25 MG (50000 UT) PO CAPS: 50000.0000 [IU] | ORAL_CAPSULE | ORAL | 0 refills | Status: AC

## 2024-07-10 MED ORDER — CYANOCOBALAMIN 1000 MCG/ML IJ SOLN
1000.0000 ug | Freq: Once | INTRAMUSCULAR | Status: AC
  Administered 2024-07-10: 1000 ug via INTRAMUSCULAR

## 2024-07-10 NOTE — Patient Instructions (Signed)
 Hypertension, Adult High blood pressure (hypertension) is when the force of blood pumping through the arteries is too strong. The arteries are the blood vessels that carry blood from the heart throughout the body. Hypertension forces the heart to work harder to pump blood and may cause arteries to become narrow or stiff. Untreated or uncontrolled hypertension can lead to a heart attack, heart failure, a stroke, kidney disease, and other problems. A blood pressure reading consists of a higher number over a lower number. Ideally, your blood pressure should be below 120/80. The first ("top") number is called the systolic pressure. It is a measure of the pressure in your arteries as your heart beats. The second ("bottom") number is called the diastolic pressure. It is a measure of the pressure in your arteries as the heart relaxes. What are the causes? The exact cause of this condition is not known. There are some conditions that result in high blood pressure. What increases the risk? Certain factors may make you more likely to develop high blood pressure. Some of these risk factors are under your control, including: Smoking. Not getting enough exercise or physical activity. Being overweight. Having too much fat, sugar, calories, or salt (sodium) in your diet. Drinking too much alcohol. Other risk factors include: Having a personal history of heart disease, diabetes, high cholesterol, or kidney disease. Stress. Having a family history of high blood pressure and high cholesterol. Having obstructive sleep apnea. Age. The risk increases with age. What are the signs or symptoms? High blood pressure may not cause symptoms. Very high blood pressure (hypertensive crisis) may cause: Headache. Fast or irregular heartbeats (palpitations). Shortness of breath. Nosebleed. Nausea and vomiting. Vision changes. Severe chest pain, dizziness, and seizures. How is this diagnosed? This condition is diagnosed by  measuring your blood pressure while you are seated, with your arm resting on a flat surface, your legs uncrossed, and your feet flat on the floor. The cuff of the blood pressure monitor will be placed directly against the skin of your upper arm at the level of your heart. Blood pressure should be measured at least twice using the same arm. Certain conditions can cause a difference in blood pressure between your right and left arms. If you have a high blood pressure reading during one visit or you have normal blood pressure with other risk factors, you may be asked to: Return on a different day to have your blood pressure checked again. Monitor your blood pressure at home for 1 week or longer. If you are diagnosed with hypertension, you may have other blood or imaging tests to help your health care provider understand your overall risk for other conditions. How is this treated? This condition is treated by making healthy lifestyle changes, such as eating healthy foods, exercising more, and reducing your alcohol intake. You may be referred for counseling on a healthy diet and physical activity. Your health care provider may prescribe medicine if lifestyle changes are not enough to get your blood pressure under control and if: Your systolic blood pressure is above 130. Your diastolic blood pressure is above 80. Your personal target blood pressure may vary depending on your medical conditions, your age, and other factors. Follow these instructions at home: Eating and drinking  Eat a diet that is high in fiber and potassium, and low in sodium, added sugar, and fat. An example of this eating plan is called the DASH diet. DASH stands for Dietary Approaches to Stop Hypertension. To eat this way: Eat  plenty of fresh fruits and vegetables. Try to fill one half of your plate at each meal with fruits and vegetables. Eat whole grains, such as whole-wheat pasta, brown rice, or whole-grain bread. Fill about one  fourth of your plate with whole grains. Eat or drink low-fat dairy products, such as skim milk or low-fat yogurt. Avoid fatty cuts of meat, processed or cured meats, and poultry with skin. Fill about one fourth of your plate with lean proteins, such as fish, chicken without skin, beans, eggs, or tofu. Avoid pre-made and processed foods. These tend to be higher in sodium, added sugar, and fat. Reduce your daily sodium intake. Many people with hypertension should eat less than 1,500 mg of sodium a day. Do not drink alcohol if: Your health care provider tells you not to drink. You are pregnant, may be pregnant, or are planning to become pregnant. If you drink alcohol: Limit how much you have to: 0-1 drink a day for women. 0-2 drinks a day for men. Know how much alcohol is in your drink. In the [place] one drink equals one 12 oz bottle of beer (355 mL), one 5 oz glass of wine (148 mL), or one 1 oz glass of hard liquor (44 mL). Lifestyle  Work with your health care provider to maintain a healthy body weight or to lose weight. Ask what an ideal weight is for you. Get at least 30 minutes of exercise that causes your heart to beat faster (aerobic exercise) most days of the week. Activities may include walking, swimming, or biking. Include exercise to strengthen your muscles (resistance exercise), such as Pilates or lifting weights, as part of your weekly exercise routine. Try to do these types of exercises for 30 minutes at least 3 days a week. Do not use any products that contain nicotine or tobacco. These products include cigarettes, chewing tobacco, and vaping devices, such as e-cigarettes. If you need help quitting, ask your health care provider. Monitor your blood pressure at home as told by your health care provider. Keep all follow-up visits. This is important. Medicines Take over-the-counter and prescription medicines only as told by your health care provider. Follow directions carefully. Blood  pressure medicines must be taken as prescribed. Do not skip doses of blood pressure medicine. Doing this puts you at risk for problems and can make the medicine less effective. Ask your health care provider about side effects or reactions to medicines that you should watch for. Contact a health care provider if you: Think you are having a reaction to a medicine you are taking. Have headaches that keep coming back (recurring). Feel dizzy. Have swelling in your ankles. Have trouble with your vision. Get help right away if you: Develop a severe headache or confusion. Have unusual weakness or numbness. Feel faint. Have severe pain in your chest or abdomen. Vomit repeatedly. Have trouble breathing. These symptoms may be an emergency. Get help right away. Call 911. Do not wait to see if the symptoms will go away. Do not drive yourself to the hospital. Summary Hypertension is when the force of blood pumping through your arteries is too strong. If this condition is not controlled, it may put you at risk for serious complications. Your personal target blood pressure may vary depending on your medical conditions, your age, and other factors. For most people, a normal blood pressure is less than 120/80. Hypertension is treated with lifestyle changes, medicines, or a combination of both. Lifestyle changes include losing weight, eating a healthy,  low-sodium diet, exercising more, and limiting alcohol. This information is not intended to replace advice given to you by your health care provider. Make sure you discuss any questions you have with your health care provider. Document Revised: 04/29/2021 Document Reviewed: 04/29/2021 Elsevier Patient Education  2024 [employer].

## 2024-07-10 NOTE — Progress Notes (Signed)
 "  Subjective:  Patient ID: [name], female    DOB: April 05, 1986  Age: 39 y.o. MRN: [id number]  CC: Hospitalization Follow-up (06/12/2024 5 day stay for acute congestive heart failure. Patient wants to discuss a possible GLP-1 to help her and explained to her in the hospital. ), Foot Pain (Left foot pain that the patient has been dealing with for about 2 years. Patient states that she was in Hydromorphone  and it helps her a lot because it's only as needed. ), and Medication Refill (Patient states that she was using her inhaler as needed and not everyday. Since she got sick she has been ising her inhaler a lot more. )   HPI [name] presents for f/up   Discussed the use of AI scribe software for clinical note transcription with the patient, who gave verbal consent to proceed.  History of Present Illness [name] is a 39 year old female with hypertension and recent acute congestive heart failure who presents for follow-up after hospitalization.  She was recently hospitalized due to acute congestive heart failure, during which she experienced significant dyspnea, stating she 'couldn't walk from the kitchen to the restroom without being out of breath' and was using her inhaler more frequently than usual. Since discharge, she feels better and is not using her inhaler as much. No chest pain, shortness of breath, or significant wheezing today.  She has a history of hypertension and normally takes her blood pressure medication daily, but forgot to take it today. She last took her medication yesterday. No headache, blurred vision, chest pain, or shortness of breath today.  She has never received a flu vaccine and does not plan to receive one today. She also has not received a COVID vaccine. Her last tetanus shot was over ten years ago, and she is willing to receive a tetanus booster today.     Outpatient Medications Prior to Visit  Medication Sig Dispense Refill    camphor-menthol  (SARNA) lotion Apply topically as needed for itching. 222 mL 0   cetirizine  (ZYRTEC ) 10 MG tablet Take 1 tablet (10 mg total) by mouth daily. 30 tablet 1   cyanocobalamin  1000 MCG tablet Take 1 tablet (1,000 mcg total) by mouth daily. 30 tablet 2   DULoxetine  (CYMBALTA ) 20 MG capsule Take 20 mg by mouth daily as needed.     Dupilumab  (DUPIXENT ) 300 MG/2ML SOAJ Inject 300 mg into the skin every 14 (fourteen) days.     ferrous sulfate  325 (65 FE) MG tablet Take 1 tablet (325 mg total) by mouth daily with breakfast. 30 tablet 2   folic acid  (FOLVITE ) 1 MG tablet Take 1 mg by mouth daily.     levalbuterol  (XOPENEX  HFA) 45 MCG/ACT inhaler INHALE 2 PUFFS BY MOUTH INTO THE LUNGS EVERY 6 HOURS AS NEEDED FOR WHEEZE. Patient will need to follow up with PCP for future refills 15 g 1   lidocaine  (LMX) 4 % cream Apply topically 2 (two) times daily as needed (plaque psoriasis flare pain). 30 g 0   metoprolol  succinate (TOPROL  XL) 50 MG 24 hr tablet Take 1 tablet (50 mg total) by mouth daily. Take with or immediately following a meal. 30 tablet 5   nebivolol  (BYSTOLIC ) 10 MG tablet Take 10 mg by mouth daily.     potassium chloride  (KLOR-CON  M) 10 MEQ tablet Take 10 mEq by mouth 2 (two) times daily.     prazosin (MINIPRESS) 1 MG capsule Take 1 mg by mouth at bedtime.  spironolactone  (ALDACTONE ) 50 MG tablet Take 1 tablet (50 mg total) by mouth daily. 30 tablet 1   traZODone  (DESYREL ) 50 MG tablet Take 0.5 tablets (25 mg total) by mouth at bedtime as needed for sleep. 30 tablet 1   oxyCODONE  (OXY IR/ROXICODONE ) 5 MG immediate release tablet Take 1-2 tablets (5-10 mg total) by mouth every 6 (six) hours as needed for moderate pain (pain score 4-6) or severe pain (pain score 7-10). 15 tablet 0   torsemide  (DEMADEX ) 20 MG tablet Take 1 tablet (20 mg total) by mouth daily. 30 tablet 0   Vitamin D , Ergocalciferol , (DRISDOL ) 1.25 MG (50000 UNIT) CAPS capsule Take 1 capsule (50,000 Units total) by mouth  every 7 (seven) days. 12 capsule 0   No facility-administered medications prior to visit.    ROS Review of Systems  Constitutional:  Positive for fatigue. Negative for appetite change, chills, diaphoresis and fever.  HENT: Negative.  Negative for sore throat and trouble swallowing.   Eyes:  Negative for visual disturbance.  Respiratory:  Positive for shortness of breath. Negative for cough, chest tightness and wheezing.   Cardiovascular:  Negative for chest pain, palpitations and leg swelling.  Gastrointestinal:  Negative for abdominal pain, constipation, diarrhea, nausea and vomiting.  Genitourinary: Negative.  Negative for difficulty urinating and dysuria.  Musculoskeletal: Negative.  Negative for arthralgias and myalgias.  Skin: Negative.   Neurological:  Negative for dizziness, weakness and light-headedness.  Hematological:  Negative for adenopathy. Does not bruise/bleed easily.  Psychiatric/Behavioral: Negative.      Objective:  BP (!) 160/96 (BP Location: Left Arm, Patient Position: Sitting, Cuff Size: Large)   Pulse 87   Temp 98.5 F (36.9 C) (Oral)   Resp 16   Ht 5' 10 (1.778 m)   Wt (!) 343 lb 3.2 oz (155.7 kg)   LMP 06/13/2024 (Approximate)   SpO2 99%   BMI 49.24 kg/m   BP Readings from Last 3 Encounters:  07/10/24 (!) 160/96  06/21/24 (!) 142/82  06/21/24 (!) 140/80    Wt Readings from Last 3 Encounters:  07/10/24 (!) 343 lb 3.2 oz (155.7 kg)  06/21/24 (!) 344 lb 3.2 oz (156.1 kg)  06/21/24 (!) 343 lb (155.6 kg)    Physical Exam Vitals reviewed.  Constitutional:      General: She is not in acute distress.    Appearance: She is obese. She is not toxic-appearing or diaphoretic.  HENT:     Nose: Nose normal.     Mouth/Throat:     Mouth: Mucous membranes are moist.  Eyes:     General: No scleral icterus.    Conjunctiva/sclera: Conjunctivae normal.  Cardiovascular:     Rate and Rhythm: Normal rate and regular rhythm.     Heart sounds: No murmur  heard.    No friction rub. No gallop.  Pulmonary:     Effort: Pulmonary effort is normal.     Breath sounds: No stridor. No wheezing, rhonchi or rales.  Abdominal:     General: Abdomen is protuberant. Bowel sounds are normal. There is no distension.     Palpations: Abdomen is soft. There is no hepatomegaly, splenomegaly or mass.     Tenderness: There is no abdominal tenderness. There is no guarding.  Musculoskeletal:        General: Normal range of motion.     Cervical back: Neck supple.     Right lower leg: No edema.     Left lower leg: No edema.  Lymphadenopathy:  Cervical: No cervical adenopathy.  Skin:    General: Skin is warm and dry.  Neurological:     General: No focal deficit present.     Mental Status: She is alert.     Gait: Gait normal.  Psychiatric:        Mood and Affect: Mood normal.        Behavior: Behavior normal.     Lab Results  Component Value Date   WBC 6.8 07/10/2024   HGB 9.8 (L) 07/10/2024   HCT 33.1 (L) 07/10/2024   PLT 200.0 07/10/2024   GLUCOSE 85 07/10/2024   CHOL 157 06/21/2024   TRIG 174 (H) 06/21/2024   HDL 42 06/21/2024   LDLDIRECT 87.0 10/17/2020   LDLCALC 81 06/21/2024   ALT 16 03/30/2023   AST 18 03/30/2023   NA 135 07/10/2024   K 3.6 07/10/2024   CL 100 07/10/2024   CREATININE 0.54 07/10/2024   BUN 8 07/10/2024   CO2 24 07/10/2024   TSH 1.920 06/12/2024   INR 1.0 03/16/2023   HGBA1C 5.0 06/21/2024    No results found.  Assessment & Plan:  Anemia, unspecified type -     Vitamin B1; Future -     CBC with Differential/Platelet; Future  Thrombocytopenia- PLTs are normal. -     Vitamin B1; Future -     CBC with Differential/Platelet; Future  Vitamin B12 deficiency anemia due to intrinsic factor deficiency -     CBC with Differential/Platelet; Future -     Cyanocobalamin   Severe obesity (BMI >= 40) (HCC) -     Semaglutide -Weight Management; Inject 0.25 mg into the skin once a week for 28 days.  Dispense: 2 mL;  Refill: 0 -     Semaglutide -Weight Management; Inject 0.5 mg into the skin once a week for 28 days.  Dispense: 2 mL; Refill: 0 -     Semaglutide -Weight Management; Inject 1 mg into the skin once a week for 28 days.  Dispense: 2 mL; Refill: 0 -     Semaglutide -Weight Management; Inject 1.7 mg into the skin once a week for 28 days.  Dispense: 3 mL; Refill: 0 -     Semaglutide -Weight Management; Inject 2.4 mg into the skin once a week for 28 days.  Dispense: 3 mL; Refill: 0 -     Insulin  Pen Needle; 1 Act by Does not apply route once a week.  Dispense: 100 each; Refill: 0  Primary hypertension- Will try to achieve better BP control. -     Basic metabolic panel with GFR; Future -     AMB Referral [facility] Care Management -     Torsemide ; Take 1 tablet (20 mg total) by mouth daily.  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 -     Vitamin B6; Future  Chronic heart failure with preserved ejection fraction (HCC) -     Semaglutide -Weight Management; Inject 0.25 mg into the skin once a week for 28 days.  Dispense: 2 mL; Refill: 0 -     Semaglutide -Weight Management; Inject 0.5 mg into the skin once a week for 28 days.  Dispense: 2 mL; Refill: 0 -     Semaglutide -Weight Management; Inject 1 mg into the skin once a week for 28 days.  Dispense: 2 mL; Refill: 0 -     Semaglutide -Weight Management; Inject 1.7 mg into the skin once a week for 28 days.  Dispense: 3 mL; Refill: 0 -  Semaglutide -Weight Management; Inject 2.4 mg into the skin once a week for 28 days.  Dispense: 3 mL; Refill: 0 -     Insulin  Pen Needle; 1 Act by Does not apply route once a week.  Dispense: 100 each; Refill: 0 -     Torsemide ; Take 1 tablet (20 mg total) by mouth daily.  Dispense: 90 tablet; Refill: 0 -     For home use only DME Other see comment -     Ambulatory referral to Home Health  History of Roux-en-Y gastric bypass -     VITAMIN D  25 Hydroxy (Vit-D Deficiency,  Fractures); Future  Immunization due -     Tdap vaccine greater than or equal to 7yo IM  Vitamin D  deficiency disease -     Cholecalciferol ; Take 1 capsule (50,000 Units total) by mouth once a week.  Dispense: 12 capsule; Refill: 0     Follow-up: Return in about 3 months (around 10/08/2024).  Debby Molt, MD "

## 2024-07-11 ENCOUNTER — Encounter: Payer: Self-pay | Admitting: Internal Medicine

## 2024-07-12 ENCOUNTER — Telehealth: Payer: Self-pay | Admitting: *Deleted

## 2024-07-12 NOTE — Progress Notes (Signed)
 Care Guide Pharmacy Note  07/12/2024 Name: [name] MRN: [id number] DOB: 08-16-1985  Referred By: Joshua Debby CROME, MD Reason for referral: Call Attempt #1 and Complex Care Management (Outreach to schedule referral with pharmacist )   [name] is a 39 y.o. year old female who is a primary care patient of Joshua Debby CROME, MD.  [name] was referred to the pharmacist for assistance related to: HTN  An unsuccessful telephone outreach was attempted today to contact the patient who was referred to the pharmacy team for assistance with medication management. Additional attempts will be made to contact the patient.  Thedford Franks, CMA, AAMA [place]  [facility], [facility] Guide, Lead Direct Dial: [phone]  Fax: [phone]

## 2024-07-13 ENCOUNTER — Telehealth: Payer: Self-pay

## 2024-07-13 ENCOUNTER — Encounter: Payer: Self-pay | Admitting: Internal Medicine

## 2024-07-13 NOTE — Telephone Encounter (Signed)
 She would need an appointment for this correct ?

## 2024-07-13 NOTE — Progress Notes (Unsigned)
 Care Guide Pharmacy Note  07/13/2024 Name: Cadince Hilscher MRN: [id number] DOB: 08-29-1985  Referred By: Joshua Debby CROME, MD Reason for referral: Call Attempt #1 and Complex Care Management (Outreach to schedule referral with pharmacist )   Abagale Rasha Bo is a 39 y.o. year old female who is a primary care patient of Joshua Debby CROME, MD.  Rakeb Rasha Rineer was referred to the pharmacist for assistance related to: HTN  A second unsuccessful telephone outreach was attempted today to contact the patient who was referred to the pharmacy team for assistance with medication management. Additional attempts will be made to contact the patient.  Thedford Franks, CMA, AAMA [place]  [facility], [facility] Guide, Lead Direct Dial: [phone]  Fax: [phone]

## 2024-07-13 NOTE — Telephone Encounter (Signed)
 Copied from CRM #[id number]. Topic: Clinical - Medication Prior Auth >> Jul 13, 2024  3:15 PM Viola F wrote: Reason for CRM: Patient called to follow up on the prior authorization for the semaglutide -weight management (WEGOVY ) 0.25 MG/0.5ML SOAJ SQ injection

## 2024-07-13 NOTE — Telephone Encounter (Signed)
O2 ordered

## 2024-07-13 NOTE — Telephone Encounter (Signed)
 Copied from CRM [id number]. Topic: Clinical - Medical Advice >> Jul 13, 2024  3:16 PM Viola F wrote: Reason for CRM: Patient wants to know if Dr. Joshua will prescribe oxygen  for her. She had it before in 2021 and it was shipped to her. She wants to know if she could do the same thing. She says she's not having shortness of breath but she is having a hard time taking breaths. Please call her at [phone]

## 2024-07-14 ENCOUNTER — Observation Stay (HOSPITAL_COMMUNITY)
Admission: EM | Admit: 2024-07-14 | Discharge: 2024-07-16 | Disposition: A | Discharge status: Home / Self Care | Attending: Internal Medicine | Admitting: Internal Medicine

## 2024-07-14 ENCOUNTER — Encounter (HOSPITAL_COMMUNITY): Payer: Self-pay | Admitting: Internal Medicine

## 2024-07-14 ENCOUNTER — Other Ambulatory Visit: Payer: Self-pay

## 2024-07-14 ENCOUNTER — Emergency Department (HOSPITAL_COMMUNITY)

## 2024-07-14 DIAGNOSIS — I509 Heart failure, unspecified: Secondary | ICD-10-CM

## 2024-07-14 DIAGNOSIS — Z79899 Other long term (current) drug therapy: Secondary | ICD-10-CM | POA: Insufficient documentation

## 2024-07-14 DIAGNOSIS — I110 Hypertensive heart disease with heart failure: Secondary | ICD-10-CM | POA: Diagnosis not present

## 2024-07-14 DIAGNOSIS — I5033 Acute on chronic diastolic (congestive) heart failure: Secondary | ICD-10-CM | POA: Insufficient documentation

## 2024-07-14 DIAGNOSIS — Z6841 Body Mass Index (BMI) 40.0 and over, adult: Secondary | ICD-10-CM | POA: Diagnosis not present

## 2024-07-14 DIAGNOSIS — J4521 Mild intermittent asthma with (acute) exacerbation: Principal | ICD-10-CM

## 2024-07-14 DIAGNOSIS — I5032 Chronic diastolic (congestive) heart failure: Secondary | ICD-10-CM | POA: Diagnosis present

## 2024-07-14 DIAGNOSIS — R0602 Shortness of breath: Secondary | ICD-10-CM | POA: Diagnosis present

## 2024-07-14 DIAGNOSIS — F411 Generalized anxiety disorder: Secondary | ICD-10-CM | POA: Diagnosis not present

## 2024-07-14 DIAGNOSIS — D509 Iron deficiency anemia, unspecified: Secondary | ICD-10-CM | POA: Diagnosis not present

## 2024-07-14 DIAGNOSIS — I10 Essential (primary) hypertension: Secondary | ICD-10-CM | POA: Diagnosis present

## 2024-07-14 DIAGNOSIS — E66813 Obesity, class 3: Secondary | ICD-10-CM | POA: Diagnosis not present

## 2024-07-14 DIAGNOSIS — U071 COVID-19: Principal | ICD-10-CM | POA: Insufficient documentation

## 2024-07-14 DIAGNOSIS — G4733 Obstructive sleep apnea (adult) (pediatric): Secondary | ICD-10-CM | POA: Insufficient documentation

## 2024-07-14 DIAGNOSIS — J45901 Unspecified asthma with (acute) exacerbation: Secondary | ICD-10-CM | POA: Diagnosis not present

## 2024-07-14 DIAGNOSIS — E559 Vitamin D deficiency, unspecified: Secondary | ICD-10-CM | POA: Diagnosis not present

## 2024-07-14 LAB — CBC
HCT: 29.1 % — ABNORMAL LOW (ref 36.0–46.0)
Hemoglobin: 8.1 g/dL — ABNORMAL LOW (ref 12.0–15.0)
MCH: 21.7 pg — ABNORMAL LOW (ref 26.0–34.0)
MCHC: 27.8 g/dL — ABNORMAL LOW (ref 30.0–36.0)
MCV: 78 fL — ABNORMAL LOW (ref 80.0–100.0)
Platelets: 123 K/uL — ABNORMAL LOW (ref 150–400)
RBC: 3.73 MIL/uL — ABNORMAL LOW (ref 3.87–5.11)
RDW: 24.3 % — ABNORMAL HIGH (ref 11.5–15.5)
WBC: 5.7 K/uL (ref 4.0–10.5)
nRBC: 0 % (ref 0.0–0.2)

## 2024-07-14 LAB — PRO BRAIN NATRIURETIC PEPTIDE: Pro Brain Natriuretic Peptide: 1096 pg/mL — ABNORMAL HIGH

## 2024-07-14 LAB — HCG, SERUM, QUALITATIVE: Preg, Serum: NEGATIVE

## 2024-07-14 LAB — BASIC METABOLIC PANEL WITH GFR
Anion gap: 10 (ref 5–15)
BUN: 15 mg/dL (ref 6–20)
CO2: 21 mmol/L — ABNORMAL LOW (ref 22–32)
Calcium: 8.5 mg/dL — ABNORMAL LOW (ref 8.9–10.3)
Chloride: 103 mmol/L (ref 98–111)
Creatinine, Ser: 0.84 mg/dL (ref 0.44–1.00)
GFR, Estimated: >60 mL/min
Glucose, Bld: 97 mg/dL (ref 70–99)
Potassium: 3.9 mmol/L (ref 3.5–5.1)
Sodium: 135 mmol/L (ref 135–145)

## 2024-07-14 LAB — RESP PANEL BY RT-PCR (RSV, FLU A&B, COVID)  RVPGX2
Influenza A by PCR: NEGATIVE
Influenza B by PCR: NEGATIVE
Resp Syncytial Virus by PCR: NEGATIVE
SARS Coronavirus 2 by RT PCR: POSITIVE — AB

## 2024-07-14 LAB — VITAMIN B1: Vitamin B1 (Thiamine): 11 nmol/L (ref 8–30)

## 2024-07-14 LAB — VITAMIN B6: Vitamin B6: 3.9 ng/mL (ref 2.1–21.7)

## 2024-07-14 MED ORDER — SPIRONOLACTONE 25 MG PO TABS
50.0000 mg | ORAL_TABLET | Freq: Every day | ORAL | Status: DC
  Administered 2024-07-15 – 2024-07-16 (×2): 50 mg via ORAL
  Filled 2024-07-14: qty 2

## 2024-07-14 MED ORDER — DEXAMETHASONE SOD PHOSPHATE PF 10 MG/ML IJ SOLN
6.0000 mg | INTRAMUSCULAR | Status: DC
  Administered 2024-07-15 – 2024-07-16 (×2): 6 mg via INTRAVENOUS
  Filled 2024-07-14 (×2): qty 1

## 2024-07-14 MED ORDER — ONDANSETRON HCL 4 MG PO TABS: 4.0000 mg | ORAL_TABLET | Freq: Four times a day (QID) | ORAL | Status: DC | PRN

## 2024-07-14 MED ORDER — FUROSEMIDE 10 MG/ML IJ SOLN
20.0000 mg | Freq: Two times a day (BID) | INTRAMUSCULAR | Status: DC
  Administered 2024-07-14: 20 mg via INTRAVENOUS
  Filled 2024-07-14: qty 4

## 2024-07-14 MED ORDER — TORSEMIDE 20 MG PO TABS
20.0000 mg | ORAL_TABLET | Freq: Every day | ORAL | Status: DC
  Administered 2024-07-15 – 2024-07-16 (×2): 20 mg via ORAL
  Filled 2024-07-14 (×2): qty 1

## 2024-07-14 MED ORDER — ONDANSETRON HCL 4 MG/2ML IJ SOLN: 4.0000 mg | Freq: Four times a day (QID) | INTRAMUSCULAR | Status: DC | PRN

## 2024-07-14 MED ORDER — POTASSIUM CHLORIDE CRYS ER 20 MEQ PO TBCR
40.0000 meq | EXTENDED_RELEASE_TABLET | Freq: Once | ORAL | Status: AC
  Administered 2024-07-14: 40 meq via ORAL
  Filled 2024-07-14: qty 2

## 2024-07-14 MED ORDER — PAXLOVID (300/100) 20 X 150 MG & 10 X 100MG PO TBPK: 3.0000 | ORAL_TABLET | Freq: Two times a day (BID) | ORAL | 0 refills | Status: AC

## 2024-07-14 MED ORDER — ALBUTEROL SULFATE (2.5 MG/3ML) 0.083% IN NEBU: 2.5000 mg | INHALATION_SOLUTION | RESPIRATORY_TRACT | Status: DC | PRN

## 2024-07-14 MED ORDER — LISINOPRIL 10 MG PO TABS
5.0000 mg | ORAL_TABLET | Freq: Every day | ORAL | Status: DC
  Administered 2024-07-14: 5 mg via ORAL
  Filled 2024-07-14: qty 1

## 2024-07-14 MED ORDER — IPRATROPIUM-ALBUTEROL 0.5-2.5 (3) MG/3ML IN SOLN
3.0000 mL | Freq: Once | RESPIRATORY_TRACT | Status: AC
  Administered 2024-07-14: 3 mL via RESPIRATORY_TRACT
  Filled 2024-07-14: qty 3

## 2024-07-14 MED ORDER — TRAZODONE HCL 50 MG PO TABS: 50.0000 mg | ORAL_TABLET | Freq: Every evening | ORAL | Status: DC | PRN

## 2024-07-14 MED ORDER — FUROSEMIDE 10 MG/ML IJ SOLN
20.0000 mg | Freq: Once | INTRAMUSCULAR | Status: AC
  Administered 2024-07-14: 20 mg via INTRAVENOUS
  Filled 2024-07-14: qty 4

## 2024-07-14 MED ORDER — ACETAMINOPHEN 325 MG PO TABS
650.0000 mg | ORAL_TABLET | Freq: Four times a day (QID) | ORAL | Status: DC | PRN
  Administered 2024-07-14 – 2024-07-15 (×3): 650 mg via ORAL
  Filled 2024-07-14 (×3): qty 2

## 2024-07-14 MED ORDER — PREDNISONE 20 MG PO TABS
60.0000 mg | ORAL_TABLET | Freq: Once | ORAL | Status: AC
  Administered 2024-07-14: 60 mg via ORAL
  Filled 2024-07-14: qty 3

## 2024-07-14 MED ORDER — METOPROLOL SUCCINATE ER 50 MG PO TB24
50.0000 mg | ORAL_TABLET | Freq: Every day | ORAL | Status: DC
  Administered 2024-07-14 – 2024-07-16 (×3): 50 mg via ORAL
  Filled 2024-07-14 (×3): qty 1

## 2024-07-14 MED ORDER — ACETAMINOPHEN 650 MG RE SUPP: 650.0000 mg | Freq: Four times a day (QID) | RECTAL | Status: DC | PRN

## 2024-07-14 MED ORDER — IPRATROPIUM-ALBUTEROL 0.5-2.5 (3) MG/3ML IN SOLN
3.0000 mL | Freq: Four times a day (QID) | RESPIRATORY_TRACT | Status: DC
  Administered 2024-07-14 – 2024-07-15 (×3): 3 mL via RESPIRATORY_TRACT
  Filled 2024-07-14 (×3): qty 3

## 2024-07-14 NOTE — ED Notes (Signed)
 Pt ambulated around room on oxygen  (2 liters nasal cannula). Pt advised she felt like she could breathe better on oxygen . Pt's oxygen  saturation stayed at 100%.

## 2024-07-14 NOTE — ED Notes (Signed)
 Updated mother of patient on plan of care and disposition.  Food brought in by parent.  PT given fan, states feels warm.  Will reassess fever momentarily

## 2024-07-14 NOTE — ED Notes (Addendum)
 Disregard note.

## 2024-07-14 NOTE — H&P (Signed)
 " History and Physical    Patient: [name] FMW:[id number] DOB: [birth year] DOA: 07/14/2024 DOS: the patient was seen and examined on 07/14/2024 PCP: Joshua Debby CROME, MD  Patient coming from: Home  Chief Complaint:  Chief Complaint  Patient presents with   Shortness of Breath   HPI: [name] is a 39 y.o. female with medical history significant of normocytic anemia, thiamine  deficiency, B12 deficiency, vitamin D  deficiency, folate deficiency, depression, generalized anxiety disorder, class III obesity, sleep apnea, history of thrombocytopenia, vasculitis, chronic diastolic heart failure who presented to the emergency department with complaints of progressively worse dyspnea associated with rhinorrhea, sore throat, wheezing, fatigue, malaise, myalgias and arthralgias for the past 2 days.  Last month she was admitted from 06/12/2024 until 06/17/2024 due to acute respiratory failure with hypoxia in the setting of CHF exacerbation and community-acquired pneumonia.  No hemoptysis.  No chest pain, palpitations, diaphoresis, PND, orthopnea, but occasionally has pitting edema of the lower extremities.  No abdominal pain, nausea, emesis, diarrhea, constipation, melena or hematochezia.  No flank pain, dysuria, frequency or hematuria.  No polyuria, polydipsia, polyphagia or blurred vision.   Lab work: CBC showed white count 5.7, hemoglobin 8.1 g/dL with an MCV of 21.9 fL and platelets 123.  Coronavirus PCR was positive, negative influenza A/B and RSV PCR.  Serum pregnancy test was negative.  BMP showed a CO2 of 21 mmol/L with a normal anion gap and calcium of 8.5 mg/dL.  Glucose, renal function and the rest of the electrolytes were normal.  proBNP was 1096 pg/mL.  Imaging: 2 view chest radiograph showing a stable cardiomegaly with no active lung disease.  ED course: Initial vital signs were temperature 98.7 F, pulse 84, respiration 19, BP 166/91 mmHg and O2 sat 99% on room air.  The  patient received furosemide  20 mg IVP, 2 DuoNeb's and prednisone  60 mg p.o. x 1.  I added KCl 40 mg p.o. x 1.   Review of Systems: As mentioned in the history of present illness. All other systems reviewed and are negative. 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     Past Surgical History:  Procedure Laterality Date   BREATH SHIRLEAN VEAR LORA  02/09/2012   Procedure: BREATH TEK VEAR LORA;  Surgeon: Donnice KATHEE Lunger, MD;  Location: [facility];  Service: General;  Laterality: N/A;   GASTRIC BYPASS  08/29/2012   HERNIA REPAIR  2014   Social History:  reports that she has never smoked. She has never been exposed to tobacco smoke. She has never used smokeless tobacco. She reports that she does not currently use alcohol. She reports that she does not use drugs.  Allergies[1]  Family History  Problem Relation Age of Onset   Breast cancer Maternal Grandmother 40   Hypertension Father    Hypertension Mother    Asthma Brother    Alcohol abuse Neg Hx    Arthritis Neg Hx    Cancer Neg Hx    Early death Neg Hx    Heart disease Neg Hx    Hyperlipidemia  Neg Hx    Kidney disease Neg Hx    Stroke Neg Hx     Prior to Admission medications  Medication Sig Start Date End Date Taking? Authorizing Provider  camphor-menthol  [facility]) lotion Apply topically as needed for itching. 06/17/24   Rosario Leatrice FERNS, MD  cetirizine  (ZYRTEC ) 10 MG tablet Take 1 tablet (10 mg total) by mouth daily. 06/18/24   Rosario Leatrice I, MD  Cholecalciferol  1.25 MG (50000 UT) capsule Take 1 capsule (50,000 Units total) by mouth once  a week. 07/10/24   Joshua Debby CROME, MD  cyanocobalamin  1000 MCG tablet Take 1 tablet (1,000 mcg total) by mouth daily. 06/18/24   Rosario Leatrice FERNS, MD  DULoxetine  (CYMBALTA ) 20 MG capsule Take 20 mg by mouth daily as needed.    [provider]  Dupilumab  (DUPIXENT ) 300 MG/2ML SOAJ Inject 300 mg into the skin every 14 (fourteen) days.    [provider]  ferrous sulfate  325 (65 FE) MG tablet Take 1 tablet (325 mg total) by mouth daily with breakfast. 06/18/24   Rosario Leatrice FERNS, MD  folic acid  (FOLVITE ) 1 MG tablet Take 1 mg by mouth daily.    [provider]  Insulin  Pen Needle 32G X 6 MM MISC 1 Act by Does not apply route once a week. 07/10/24   Joshua Debby CROME, MD  levalbuterol  (XOPENEX  HFA) 45 MCG/ACT inhaler INHALE 2 PUFFS BY MOUTH INTO THE LUNGS EVERY 6 HOURS AS NEEDED FOR WHEEZE. Patient will need to follow up with PCP for future refills 06/21/24   Purcell Emil Schanz, MD  lidocaine  (LMX) 4 % cream Apply topically 2 (two) times daily as needed (plaque psoriasis flare pain). 06/17/24   Rosario Leatrice FERNS, MD  metoprolol  succinate (TOPROL  XL) 50 MG 24 hr tablet Take 1 tablet (50 mg total) by mouth daily. Take with or immediately following a meal. 06/21/24   Lee, Jordan, NP  nebivolol  (BYSTOLIC ) 10 MG tablet Take 10 mg by mouth daily.    [provider]  potassium chloride  (KLOR-CON  M) 10 MEQ tablet Take 10 mEq by mouth 2 (two) times daily.    [provider]  prazosin (MINIPRESS) 1 MG capsule Take 1 mg by mouth at bedtime.    [provider]  semaglutide -weight management (WEGOVY ) 0.25 MG/0.5ML SOAJ SQ injection Inject 0.25 mg into the skin once a week for 28 days. 07/10/24 08/07/24  Joshua Debby CROME, MD  semaglutide -weight management (WEGOVY ) 0.5 MG/0.5ML SOAJ SQ injection Inject 0.5 mg into the skin once a week for 28 days. 08/08/24 09/05/24  Joshua Debby CROME, MD  semaglutide -weight management (WEGOVY ) 1 MG/0.5ML SOAJ SQ injection Inject 1 mg into  the skin once a week for 28 days. 09/06/24 10/04/24  Joshua Debby CROME, MD  semaglutide -weight management (WEGOVY ) 1.7 MG/0.75ML SOAJ SQ injection Inject 1.7 mg into the skin once a week for 28 days. 10/05/24 11/02/24  Joshua Debby CROME, MD  semaglutide -weight management (WEGOVY ) 2.4 MG/0.75ML SOAJ SQ injection Inject 2.4 mg into the skin once a week for 28 days. 11/03/24 12/01/24  Joshua Debby CROME, MD  spironolactone  (ALDACTONE ) 50 MG tablet Take 1 tablet (50 mg total) by mouth daily. 06/17/24   Rosario Leatrice FERNS, MD  torsemide  (DEMADEX ) 20 MG tablet Take 1 tablet (20 mg total) by mouth daily. 07/10/24 08/09/24  Joshua Debby CROME, MD  traZODone  (DESYREL ) 50 MG tablet Take 0.5 tablets (25 mg total) by mouth at bedtime as needed for sleep. 06/17/24   Rosario Leatrice FERNS,  MD  FLUoxetine  (PROZAC ) 10 MG tablet Take 1 tablet (10 mg total) by mouth daily. Patient not taking: Reported on 02/01/2019 03/17/18 02/01/19  Joshua Debby CROME, MD  phentermine  37.5 MG capsule Take 1 capsule (37.5 mg total) by mouth every morning. Patient not taking: Reported on 02/01/2019 10/20/18 02/01/19  Joshua Debby CROME, MD    Physical Exam: Vitals:   07/14/24 1400 07/14/24 1441 07/14/24 1535 07/14/24 1603  BP: (!) 148/88  (!) 159/86   Pulse: 98  94   Resp: (!) 25  17   Temp:  98 F (36.7 C) 99.5 F (37.5 C)   TempSrc:  Oral Oral   SpO2: 98%  98% 100%  Weight:      Height:       Physical Exam Vitals and nursing note reviewed.  Constitutional:      General: She is awake. She is not in acute distress.    Appearance: She is morbidly obese. She is ill-appearing.     Interventions: Nasal cannula in place.  HENT:     Head: Normocephalic.     Nose: No rhinorrhea.     Mouth/Throat:     Mouth: Mucous membranes are moist.  Eyes:     General: No scleral icterus.    Pupils: Pupils are equal, round, and reactive to light.  Neck:     Vascular: No JVD.  Cardiovascular:     Rate and Rhythm: Normal rate and regular rhythm.     Heart sounds: S1  normal and S2 normal.  Pulmonary:     Effort: Pulmonary effort is normal. No accessory muscle usage or respiratory distress.     Breath sounds: Decreased breath sounds and wheezing present. No rhonchi or rales.  Abdominal:     General: Abdomen is protuberant. Bowel sounds are normal. There is no distension.     Palpations: Abdomen is soft.     Tenderness: There is no abdominal tenderness. There is no right CVA tenderness or left CVA tenderness.  Musculoskeletal:     Cervical back: Neck supple.     Right lower leg: No edema.     Left lower leg: No edema.  Skin:    General: Skin is warm and dry.  Neurological:     General: No focal deficit present.     Mental Status: She is alert and oriented to person, place, and time.  Psychiatric:        Mood and Affect: Mood normal.        Behavior: Behavior normal. Behavior is cooperative.     Data Reviewed:  Results are pending, will review when available.  06/12/2024 transthoracic echocardiogram report. IMPRESSIONS:   1. Left ventricular ejection fraction, by estimation, is >75%. Left  ventricular ejection fraction by PLAX is 75 %. The left ventricle has  hyperdynamic function. The left ventricle has no regional wall motion  abnormalities. There is mild left ventricular   hypertrophy. Left ventricular diastolic parameters are consistent with  Grade I diastolic dysfunction (impaired relaxation).   2. Right ventricular systolic function is normal. The right ventricular  size is normal.   3. The mitral valve is normal in structure. No evidence of mitral valve  regurgitation.   4. The aortic valve is tricuspid. Aortic valve regurgitation is not  visualized. No aortic stenosis is present.   5. Aortic dilatation noted. There is mild dilatation of the ascending  aorta, measuring 40 mm.   6. The inferior vena cava is normal in size with greater than  50%  respiratory variability, suggesting right atrial pressure of 3 mmHg.   Comparison(s):  Changes from prior study are noted. 03/24/2023: LVEF 60-65%.   EKG: Vent. rate 82 BPM  PR interval 186 ms  QRS duration 97 ms  QT/QTcB 441/516 ms  P-R-T axes 45 21 5  Sinus rhythm  Low voltage, precordial leads  Consider anterior infarct  Borderline T abnormalities, inferior leads  Prolonged QT interval  Baseline wander in lead(s) II III aVF V1 V3  Assessment and Plan: Principal problem:   Asthma exacerbation Secondary to:   COVID-19 virus infection Superimposed on:   Diastolic heart failure Admit to telemetry/inpatient. Supplemental oxygen  as needed. Bronchodilators as needed. Incentive spirometry while awake. Flutter valve exercises. Antitussives as needed. Dexamethasone  6 mg IVP daily. CPAP at bedtime. Follow-up CBC, CMP and chemistry.  Active Problems:   Chronic heart failure with preserved ejection fraction (HCC) The patient does not seem to be volume overloaded. Continue torsemide  20 mg p.o. daily. Continue spironolactone  50 mg p.o. daily. Continue metoprolol  succinate 50 mg p.o. daily.    Vitamin D  deficiency Continue weekly vitamin D  supplementation.    Primary hypertension Continue diuretics and beta-blocker as above.    Obstructive sleep apnea syndrome CPAP at bedtime.    GAD (generalized anxiety disorder) Continue trazodone  and duloxetine .    Obesity, Class III, BMI 40-49.9 (morbid obesity) (HCC) Current BMI 48.35 kg/m. Would benefit from lifestyle modifications. Should follow-up closely with PCP and/or bariatric clinic.    Microcytic anemia Monitor hematocrit and hemoglobin. Transfuse PRBC as needed. Follow-up with primary care provider.    Advance Care Planning:   Code Status: Full Code   Consults:   Family Communication:   Severity of Illness: The appropriate patient status for this patient is OBSERVATION. Observation status is judged to be reasonable and necessary in order to provide the required intensity of service to ensure the  patient's safety. The patient's presenting symptoms, physical exam findings, and initial radiographic and laboratory data in the context of their medical condition is felt to place them at decreased risk for further clinical deterioration. Furthermore, it is anticipated that the patient will be medically stable for discharge from the hospital within 2 midnights of admission.   Author: Alm Dorn Castor, MD 07/14/2024 4:17 PM  For on call review [email or web address].   This document was prepared using Dragon voice recognition software and may contain some unintended transcription errors.     [1]  Allergies Allergen Reactions   Lyrica  [Pregabalin ] Swelling, Rash and Other (See Comments)    Swelling is in the feet   "

## 2024-07-14 NOTE — ED Provider Notes (Signed)
 " [place] EMERGENCY DEPARTMENT AT [facility] Provider Note   CSN: [id number] Arrival date & time: 07/14/24  9063     Patient presents with: Shortness of Breath   [name] is a 39 y.o. female.   Patient is a 39 year old female with a past medical history of asthma, CHF, obesity presenting to the emergency department with cough and shortness of breath.  Patient states that for last 3 days she has felt like she has had a harder time catching her breath.  He denies any associated chest pain.  She states that she has had a cough productive of yellow sputum with associated congestion.  She denies any fever or lower extremity swelling.  She states that she feels similar though not as severe as when she was admitted to the hospital last month with a CHF exacerbation.  She denies any known sick contacts.  The history is provided by the patient.  Shortness of Breath      Prior to Admission medications  Medication Sig Start Date End Date Taking? Authorizing Provider  camphor-menthol  [facility]) lotion Apply topically as needed for itching. 06/17/24   Rosario Leatrice FERNS, MD  cetirizine  (ZYRTEC ) 10 MG tablet Take 1 tablet (10 mg total) by mouth daily. 06/18/24   Rosario Leatrice I, MD  Cholecalciferol  1.25 MG (50000 UT) capsule Take 1 capsule (50,000 Units total) by mouth once a week. 07/10/24   Joshua Debby CROME, MD  cyanocobalamin  1000 MCG tablet Take 1 tablet (1,000 mcg total) by mouth daily. 06/18/24   Rosario Leatrice FERNS, MD  DULoxetine  (CYMBALTA ) 20 MG capsule Take 20 mg by mouth daily as needed.    [provider]  Dupilumab  (DUPIXENT ) 300 MG/2ML SOAJ Inject 300 mg into the skin every 14 (fourteen) days.    [provider]  ferrous sulfate  325 (65 FE) MG tablet Take 1 tablet (325 mg total) by mouth daily with breakfast. 06/18/24   Rosario Leatrice FERNS, MD  folic acid  (FOLVITE ) 1 MG tablet Take 1 mg by mouth daily.    [provider]  Insulin  Pen  Needle 32G X 6 MM MISC 1 Act by Does not apply route once a week. 07/10/24   Joshua Debby CROME, MD  levalbuterol  (XOPENEX  HFA) 45 MCG/ACT inhaler INHALE 2 PUFFS BY MOUTH INTO THE LUNGS EVERY 6 HOURS AS NEEDED FOR WHEEZE. Patient will need to follow up with PCP for future refills 06/21/24   Purcell Emil Schanz, MD  lidocaine  (LMX) 4 % cream Apply topically 2 (two) times daily as needed (plaque psoriasis flare pain). 06/17/24   Rosario Leatrice FERNS, MD  metoprolol  succinate (TOPROL  XL) 50 MG 24 hr tablet Take 1 tablet (50 mg total) by mouth daily. Take with or immediately following a meal. 06/21/24   Lee, Jordan, NP  nebivolol  (BYSTOLIC ) 10 MG tablet Take 10 mg by mouth daily.    [provider]  potassium chloride  (KLOR-CON  M) 10 MEQ tablet Take 10 mEq by mouth 2 (two) times daily.    [provider]  prazosin (MINIPRESS) 1 MG capsule Take 1 mg by mouth at bedtime.    [provider]  semaglutide -weight management (WEGOVY ) 0.25 MG/0.5ML SOAJ SQ injection Inject 0.25 mg into the skin once a week for 28 days. 07/10/24 08/07/24  Joshua Debby CROME, MD  semaglutide -weight management (WEGOVY ) 0.5 MG/0.5ML SOAJ SQ injection Inject 0.5 mg into the skin once a week for 28 days. 08/08/24 09/05/24  Joshua Debby CROME, MD  semaglutide -weight management (  WEGOVY ) 1 MG/0.5ML SOAJ SQ injection Inject 1 mg into the skin once a week for 28 days. 09/06/24 10/04/24  Joshua Debby CROME, MD  semaglutide -weight management (WEGOVY ) 1.7 MG/0.75ML SOAJ SQ injection Inject 1.7 mg into the skin once a week for 28 days. 10/05/24 11/02/24  Joshua Debby CROME, MD  semaglutide -weight management (WEGOVY ) 2.4 MG/0.75ML SOAJ SQ injection Inject 2.4 mg into the skin once a week for 28 days. 11/03/24 12/01/24  Joshua Debby CROME, MD  spironolactone  (ALDACTONE ) 50 MG tablet Take 1 tablet (50 mg total) by mouth daily. 06/17/24   Rosario Leatrice FERNS, MD  torsemide  (DEMADEX ) 20 MG tablet Take 1 tablet (20 mg total) by mouth daily. 07/10/24 08/09/24  Joshua Debby CROME, MD  traZODone  (DESYREL ) 50 MG tablet Take 0.5 tablets (25 mg total) by mouth at bedtime as needed for sleep. 06/17/24   Rosario Leatrice FERNS, MD  FLUoxetine  (PROZAC ) 10 MG tablet Take 1 tablet (10 mg total) by mouth daily. Patient not taking: Reported on 02/01/2019 03/17/18 02/01/19  Joshua Debby CROME, MD  phentermine  37.5 MG capsule Take 1 capsule (37.5 mg total) by mouth every morning. Patient not taking: Reported on 02/01/2019 10/20/18 02/01/19  Joshua Debby CROME, MD    Allergies: Lyrica  [pregabalin ]    Review of Systems  Respiratory:  Positive for shortness of breath.     Updated Vital Signs BP (!) 148/88   Pulse 98   Temp 98 F (36.7 C) (Oral)   Resp (!) 25   Ht 5' 10 (1.778 m)   Wt (!) 155.7 kg   LMP 06/13/2024 (Approximate)   SpO2 98%   BMI 49.24 kg/m   Physical Exam Vitals and nursing note reviewed.  Constitutional:      General: She is not in acute distress.    Appearance: She is well-developed. She is obese.  HENT:     Head: Normocephalic and atraumatic.     Mouth/Throat:     Mouth: Mucous membranes are moist.  Eyes:     Extraocular Movements: Extraocular movements intact.  Cardiovascular:     Rate and Rhythm: Normal rate and regular rhythm.  Pulmonary:     Effort: Pulmonary effort is normal.     Breath sounds: Wheezing (Diffuse, end-expiratory) present.  Abdominal:     Palpations: Abdomen is soft.     Tenderness: There is no abdominal tenderness.  Musculoskeletal:        General: Normal range of motion.     Cervical back: Normal range of motion and neck supple.     Right lower leg: No edema.     Left lower leg: No edema.  Skin:    General: Skin is warm and dry.  Neurological:     General: No focal deficit present.     Mental Status: She is alert and oriented to person, place, and time.  Psychiatric:        Mood and Affect: Mood normal.        Behavior: Behavior normal.     (all labs ordered are listed, but only abnormal results are  displayed) Labs Reviewed  RESP PANEL BY RT-PCR (RSV, FLU A&B, COVID)  RVPGX2 - Abnormal; Notable for the following components:      Result Value   SARS Coronavirus 2 by RT PCR POSITIVE (*)    All other components within normal limits  BASIC METABOLIC PANEL WITH GFR - Abnormal; Notable for the following components:   CO2 21 (*)    Calcium 8.5 (*)  All other components within normal limits  CBC - Abnormal; Notable for the following components:   RBC 3.73 (*)    Hemoglobin 8.1 (*)    HCT 29.1 (*)    MCV 78.0 (*)    MCH 21.7 (*)    MCHC 27.8 (*)    RDW 24.3 (*)    Platelets 123 (*)    All other components within normal limits  PRO BRAIN NATRIURETIC PEPTIDE - Abnormal; Notable for the following components:   Pro Brain Natriuretic Peptide 1,096.0 (*)    All other components within normal limits  HCG, SERUM, QUALITATIVE    EKG: EKG Interpretation Date/Time:  Friday July 14 2024 09:49:30 EST Ventricular Rate:  82 PR Interval:  186 QRS Duration:  97 QT Interval:  441 QTC Calculation: 516 R Axis:   21  Text Interpretation: Sinus rhythm Low voltage, precordial leads Consider anterior infarct Borderline T abnormalities, inferior leads Prolonged QT interval Baseline wander in lead(s) II III aVF V1 V3 Confirmed by Ellouise Fine ([id number]) on 07/14/2024 12:32:00 PM  Radiology: DG Chest 2 View Result Date: 07/14/2024 CLINICAL DATA:  Shortness of breath and wheezing for 2 days. EXAM: CHEST - 2 VIEW COMPARISON:  06/15/2024 FINDINGS: Stable moderate cardiomegaly. Both lungs are clear. The visualized skeletal structures are unremarkable. IMPRESSION: Stable cardiomegaly. No active lung disease. Electronically Signed   By: Norleen DELENA Kil M.D.   On: 07/14/2024 10:57     Procedures   Medications Ordered in the ED  predniSONE  (DELTASONE ) tablet 60 mg (60 mg Oral Given 07/14/24 1258)  ipratropium-albuterol  (DUONEB) 0.5-2.5 (3) MG/3ML nebulizer solution 3 mL (3 mLs Nebulization Given 07/14/24 1258)   furosemide  (LASIX ) injection 20 mg (20 mg Intravenous Given 07/14/24 1435)  ipratropium-albuterol  (DUONEB) 0.5-2.5 (3) MG/3ML nebulizer solution 3 mL (3 mLs Nebulization Given 07/14/24 1433)    Clinical Course as of 07/14/24 1507  Fri Jul 14, 2024  1406 BNP elevated from baseline, will give IV lasix . Viral swab pending.  [VK]  1429 Patient reports no significant change in SOB. Has trace expiratory wheeze in RUL, otherwise wheezing has improved. Wants to try an additional neb. Will have ambulatory trial after neb and lasix  to determine disposition.  [VK]  1452 COVID positive. With risk factors, would recommend paxlovid  treatment.  [VK]  1504 Patient signed out to Dr. Lenor pending reassessment and amb O2.  [VK]    Clinical Course User Index [VK] Kingsley, Deontra Pereyra K, DO                                 Medical Decision Making This patient presents to the ED with chief complaint(s) of SOB with pertinent past medical history of asthma, CHF, obesity which further complicates the presenting complaint. The complaint involves an extensive differential diagnosis and also carries with it a high risk of complications and morbidity.    The differential diagnosis includes patient does have wheezing on exam concerning for asthma exacerbation, considering viral syndrome, ACS, arrhythmia, anemia, pneumonia, pneumothorax, pulmonary edema, pleural effusion  Additional history obtained: Additional history obtained from family Records reviewed previous admission documents and outpatient cardiology records  ED Course and Reassessment: On patient's arrival she is hemodynamically stable in no acute distress.  She had labs, chest x-ray and EKG initiated in triage.  EKG showed normal sinus rhythm without acute ischemic changes.  Patient's labs show anemia approximately at her baseline.  Chest x-ray showed no acute disease.  She will additionally have BNP and viral swab added on.  She does have some end expiratory  wheeze and will be treated with steroids and DuoNeb and will be closely reassessed.  Independent labs interpretation:  The following labs were independently interpreted: elevated BNP, COVID positive, otherwise labs at baseline  Independent visualization of imaging: - I independently visualized the following imaging with scope of interpretation limited to determining acute life threatening conditions related to emergency care: CXR, which revealed no acute disease    Amount and/or Complexity of Data Reviewed Labs: ordered. Radiology: ordered.  Risk Prescription drug management.       Final diagnoses:  Mild intermittent asthma with exacerbation  Acute on chronic congestive heart failure, unspecified heart failure type [facility])  COVID-19    ED Discharge Orders     None          Ellouise Richerd POUR, DO 07/14/24 1507  "

## 2024-07-14 NOTE — ED Triage Notes (Addendum)
 Pt reports SOB and wheezing x 2 days ago. Pt reports was here 3 weeks ago for PNA and CHF with fluid overload, pt reports her s/s today are starting to feel the same and she wants to get ahead of it. RR even and unlabored ,pt speaking in full sentences

## 2024-07-14 NOTE — Telephone Encounter (Unsigned)
 Copied from CRM [id number]. Topic: Clinical - Medical Advice >> Jul 14, 2024 10:53 AM Laymon HERO wrote: Reason for CRM: [place] # [phone] option #1 ask for her...from [facility]-calling to speak to a nurse about the order for O2- please contact her as soon as possible

## 2024-07-14 NOTE — ED Notes (Signed)
 Pt ambulated off oxygen  to bathroom and then back to her room. Pt's oxygen  saturation stayed at 100%, but pt advised she feels like she can't catch her breath like she has been.

## 2024-07-14 NOTE — Progress Notes (Signed)
" °   07/14/24 2349  BiPAP/CPAP/SIPAP  $ Non-Invasive Home Ventilator  Initial  $ Face Mask Large  Yes  BiPAP/CPAP/SIPAP Pt Type Adult  BiPAP/CPAP/SIPAP Resmed  Mask Type Full face mask  Dentures removed? Not applicable  Mask Size Large  Respiratory Rate 18 breaths/min  Flow Rate 2 lpm  Patient Home Machine No  Patient Home Mask No  Patient Home Tubing No  Auto Titrate Yes  Minimum cmH2O 5 cmH2O  Maximum cmH2O 20 cmH2O  BiPAP/CPAP /SiPAP Vitals  Pulse Rate 95  SpO2 100 %  MEWS Score/Color  MEWS Score 1  MEWS Score Color Green    "

## 2024-07-14 NOTE — ED Provider Notes (Signed)
 Care was taken over from Dr. Ellouise.  Patient presented on day 4 of a upper respiratory infection.  She has had associated shortness of breath.  She does have a history of asthma and CHF.  Her chest x-ray does not show any pneumonia or fluid overload.  Her COVID test is positive.  Her BNP is elevated.  She was given Lasix  and she says she has urinated several times since then but she still feels short of breath.  She still feels like she needs to be on oxygen .  She is not on home oxygen .  She ambulated around the room and did not get hypoxic but got tachypneic and felt more short of breath.  Will plan admission.  Discussed with Dr. Celinda who will admit the patient.   Lenor Hollering, MD 07/14/24 [phone]

## 2024-07-14 NOTE — Progress Notes (Signed)
 " Physician Discharge Summary   Patient: [name] MRN: [id number] DOB: Jan 01, 1986  Admit date:     07/14/2024  Discharge date: 07/14/2024  Discharge Physician: Alm Dorn Castor   PCP: Joshua Debby CROME, MD   Recommendations at discharge:    Discharge Diagnoses: Principal Problem:   COVID-19 virus infection Active Problems:   Vitamin D  deficiency   Primary hypertension   Obstructive sleep apnea syndrome   GAD (generalized anxiety disorder)   Chronic heart failure with preserved ejection fraction (HCC)   Obesity, Class III, BMI 40-49.9 (morbid obesity) (HCC)   Microcytic anemia   Asthma exacerbation     Consultants: None. Procedures performed: None.  Disposition: Home Diet recommendation:  Cardiac diet DISCHARGE MEDICATION: Allergies as of 07/14/2024       Reactions   Lyrica  [pregabalin ] Swelling, Rash, Other (See Comments)   Swelling is in the feet        Medication List     PAUSE taking these medications    prazosin 1 MG capsule Wait to take this until your doctor or other care provider tells you to start again. Commonly known as: MINIPRESS Take 1 mg by mouth at bedtime.       STOP taking these medications    nebivolol  10 MG tablet Commonly known as: BYSTOLIC        TAKE these medications    B-12 1000 MCG Tabs Take 1 tablet (1,000 mcg total) by mouth daily.   camphor-menthol  lotion Commonly known as: SARNA Apply topically as needed for itching.   cetirizine  10 MG tablet Commonly known as: ZYRTEC  Take 1 tablet (10 mg total) by mouth daily.   Cholecalciferol  1.25 MG (50000 UT) capsule Take 1 capsule (50,000 Units total) by mouth once a week.   DULoxetine  20 MG capsule Commonly known as: CYMBALTA  Take 20 mg by mouth daily as needed.   Dupixent  300 MG/2ML Soaj Generic drug: Dupilumab  Inject 300 mg into the skin every 14 (fourteen) days.   FeroSul 325 (65 Fe) MG tablet Generic drug: ferrous sulfate  Take 1 tablet (325 mg total) by  mouth daily with breakfast.   folic acid  1 MG tablet Commonly known as: FOLVITE  Take 1 mg by mouth daily.   Insulin  Pen Needle 32G X 6 MM Misc 1 Act by Does not apply route once a week.   levalbuterol  45 MCG/ACT inhaler Commonly known as: XOPENEX  HFA INHALE 2 PUFFS BY MOUTH INTO THE LUNGS EVERY 6 HOURS AS NEEDED FOR WHEEZE. Patient will need to follow up with PCP for future refills   lidocaine  4 % cream Commonly known as: LMX Apply topically 2 (two) times daily as needed (plaque psoriasis flare pain).   metoprolol  succinate 50 MG 24 hr tablet Commonly known as: Toprol  XL Take 1 tablet (50 mg total) by mouth daily. Take with or immediately following a meal.   Paxlovid  (300/100) 20 x 150 MG & 10 x 100MG  Tbpk Generic drug: nirmatrelvir/ritonavir Take 3 tablets by mouth 2 (two) times daily for 5 days. Patient GFR is > 60. Take nirmatrelvir (150 mg) two tablets twice daily for 5 days and ritonavir (100 mg) one tablet twice daily for 5 days.   potassium chloride  10 MEQ tablet Commonly known as: KLOR-CON  M Take 10 mEq by mouth 2 (two) times daily.   semaglutide -weight management 0.25 MG/0.5ML Soaj SQ injection Commonly known as: WEGOVY  Inject 0.25 mg into the skin once a week for 28 days.   semaglutide -weight management 0.5 MG/0.5ML Soaj SQ injection Commonly  known as: WEGOVY  Inject 0.5 mg into the skin once a week for 28 days. Start taking on: August 08, 2024   semaglutide -weight management 1 MG/0.5ML Soaj SQ injection Commonly known as: WEGOVY  Inject 1 mg into the skin once a week for 28 days. Start taking on: September 06, 2024   semaglutide -weight management 1.7 MG/0.75ML Soaj SQ injection Commonly known as: WEGOVY  Inject 1.7 mg into the skin once a week for 28 days. Start taking on: October 05, 2024   semaglutide -weight management 2.4 MG/0.75ML Soaj SQ injection Commonly known as: WEGOVY  Inject 2.4 mg into the skin once a week for 28 days. Start taking on: Nov 03, 2024    spironolactone  50 MG tablet Commonly known as: Aldactone  Take 1 tablet (50 mg total) by mouth daily.   torsemide  20 MG tablet Commonly known as: DEMADEX  Take 1 tablet (20 mg total) by mouth daily.   traZODone  50 MG tablet Commonly known as: DESYREL  Take 0.5 tablets (25 mg total) by mouth at bedtime as needed for sleep.        Follow-up Information     Joshua Debby CROME, MD.   Specialty: Internal Medicine Contact information: [address] [place] [address] [phone]                Discharge Exam: Fredricka Weights   07/14/24 0948 07/14/24 1635  Weight: (!) 155.7 kg (!) 152.9 kg   ***  Condition at discharge: fair  The results of significant diagnostics from this hospitalization (including imaging, microbiology, ancillary and laboratory) are listed below for reference.   Imaging Studies: DG Chest 2 View Result Date: 07/14/2024 CLINICAL DATA:  Shortness of breath and wheezing for 2 days. EXAM: CHEST - 2 VIEW COMPARISON:  06/15/2024 FINDINGS: Stable moderate cardiomegaly. Both lungs are clear. The visualized skeletal structures are unremarkable. IMPRESSION: Stable cardiomegaly. No active lung disease. Electronically Signed   By: Norleen DELENA Kil M.D.   On: 07/14/2024 10:57   DG CHEST PORT 1 VIEW Result Date: 06/15/2024 CLINICAL DATA:  Shortness of breath. EXAM: PORTABLE CHEST 1 VIEW COMPARISON:  Chest radiograph dated 06/12/2024. FINDINGS: Cardiomegaly with vascular congestion and edema. Pneumonia is not excluded. No pleural effusion pneumothorax. No acute osseous pathology. IMPRESSION: Similar appearance of CHF.  Pneumonia is not excluded. Electronically Signed   By: Vanetta Chou M.D.   On: 06/15/2024 18:07    Microbiology: Results for orders placed or performed during the hospital encounter of 07/14/24  Resp panel by RT-PCR (RSV, Flu A&B, Covid) Anterior Nasal Swab     Status: Abnormal   Collection Time: 07/14/24  1:47 PM   Specimen: Anterior Nasal Swab   Result Value Ref Range Status   SARS Coronavirus 2 by RT PCR POSITIVE (A) NEGATIVE Final    Comment: (NOTE) SARS-CoV-2 target nucleic acids are DETECTED.  The SARS-CoV-2 RNA is generally detectable in upper respiratory specimens during the acute phase of infection. Positive results are indicative of the presence of the identified virus, but do not rule out bacterial infection or co-infection with other pathogens not detected by the test. Clinical correlation with patient history and other diagnostic information is necessary to determine patient infection status. The expected result is Negative.  Fact Sheet for Patients: bloggercourse.com  Fact Sheet for Healthcare Providers: seriousbroker.it  This test is not yet approved or cleared by the [place]  FDA and  has been authorized for detection and/or diagnosis of SARS-CoV-2 by FDA under an Emergency Use Authorization (EUA).  This EUA will remain  in effect (meaning this test can be used) for the duration of  the COVID-19 declaration under Section 564(b)(1) of the A ct, 21 U.S.C. section 360bbb-3(b)(1), unless the authorization is terminated or revoked sooner.     Influenza A by PCR NEGATIVE NEGATIVE Final   Influenza B by PCR NEGATIVE NEGATIVE Final    Comment: (NOTE) The Xpert Xpress SARS-CoV-2/FLU/RSV plus assay is intended as an aid in the diagnosis of influenza from Nasopharyngeal swab specimens and should not be used as a sole basis for treatment. Nasal washings and aspirates are unacceptable for Xpert Xpress SARS-CoV-2/FLU/RSV testing.  Fact Sheet for Patients: bloggercourse.com  Fact Sheet for Healthcare Providers: seriousbroker.it  This test is not yet approved or cleared by the [place]  FDA and has been authorized for detection and/or diagnosis of SARS-CoV-2 by FDA under an Emergency Use Authorization (EUA).  This EUA will remain in effect (meaning this test can be used) for the duration of the COVID-19 declaration under Section 564(b)(1) of the Act, 21 U.S.C. section 360bbb-3(b)(1), unless the authorization is terminated or revoked.     Resp Syncytial Virus by PCR NEGATIVE NEGATIVE Final    Comment: (NOTE) Fact Sheet for Patients: bloggercourse.com  Fact Sheet for Healthcare Providers: seriousbroker.it  This test is not yet approved or cleared by the [place]  FDA and has been authorized for detection and/or diagnosis of SARS-CoV-2 by FDA under an Emergency Use Authorization (EUA). This EUA will remain in effect (meaning this test can be used) for the duration of the COVID-19 declaration under Section 564(b)(1) of the Act, 21 U.S.C. section 360bbb-3(b)(1), unless the authorization is terminated or revoked.  Performed at [facility], [address], [place], [place] [address]     Labs: CBC: Recent Labs  Lab 07/10/24 1540 07/14/24 1019  WBC 6.8 5.7  NEUTROABS 4.2  --   HGB 9.8* 8.1*  HCT 33.1* 29.1*  MCV 71.5* 78.0*  PLT 200.0 123*   Basic Metabolic Panel: Recent Labs  Lab 07/10/24 1540 07/14/24 1019  NA 135 135  K 3.6 3.9  CL 100 103  CO2 24 21*  GLUCOSE 85 97  BUN 8 15  CREATININE 0.54 0.84  CALCIUM 9.0 8.5*   Liver Function Tests: No results for input(s): AST, ALT, ALKPHOS, BILITOT, PROT, ALBUMIN in the last 168 hours. CBG: No results for input(s): GLUCAP in the last 168 hours.  Discharge time spent: greater than 30 minutes.  Signed: Alm Dorn Castor, MD [facility] 07/14/2024 "

## 2024-07-15 DIAGNOSIS — U071 COVID-19: Secondary | ICD-10-CM | POA: Diagnosis not present

## 2024-07-15 LAB — CBC
HCT: 29.8 % — ABNORMAL LOW (ref 36.0–46.0)
Hemoglobin: 8.2 g/dL — ABNORMAL LOW (ref 12.0–15.0)
MCH: 21.3 pg — ABNORMAL LOW (ref 26.0–34.0)
MCHC: 27.5 g/dL — ABNORMAL LOW (ref 30.0–36.0)
MCV: 77.4 fL — ABNORMAL LOW (ref 80.0–100.0)
Platelets: 159 K/uL (ref 150–400)
RBC: 3.85 MIL/uL — ABNORMAL LOW (ref 3.87–5.11)
RDW: 24.5 % — ABNORMAL HIGH (ref 11.5–15.5)
WBC: 5.9 K/uL (ref 4.0–10.5)
nRBC: 0 % (ref 0.0–0.2)

## 2024-07-15 LAB — PHOSPHORUS: Phosphorus: 3.7 mg/dL (ref 2.5–4.6)

## 2024-07-15 LAB — COMPREHENSIVE METABOLIC PANEL WITH GFR
ALT: 15 U/L (ref 0–44)
AST: 25 U/L (ref 15–41)
Albumin: 4 g/dL (ref 3.5–5.0)
Alkaline Phosphatase: 95 U/L (ref 38–126)
Anion gap: 10 (ref 5–15)
BUN: 15 mg/dL (ref 6–20)
CO2: 24 mmol/L (ref 22–32)
Calcium: 9.2 mg/dL (ref 8.9–10.3)
Chloride: 102 mmol/L (ref 98–111)
Creatinine, Ser: 0.65 mg/dL (ref 0.44–1.00)
GFR, Estimated: >60 mL/min
Glucose, Bld: 103 mg/dL — ABNORMAL HIGH (ref 70–99)
Potassium: 4 mmol/L (ref 3.5–5.1)
Sodium: 135 mmol/L (ref 135–145)
Total Bilirubin: 0.3 mg/dL (ref 0.0–1.2)
Total Protein: 7.9 g/dL (ref 6.5–8.1)

## 2024-07-15 LAB — MAGNESIUM: Magnesium: 2.2 mg/dL (ref 1.7–2.4)

## 2024-07-15 MED ORDER — OXYCODONE HCL 5 MG PO TABS
5.0000 mg | ORAL_TABLET | Freq: Four times a day (QID) | ORAL | Status: DC | PRN
  Administered 2024-07-15: 5 mg via ORAL
  Filled 2024-07-15: qty 1

## 2024-07-15 MED ORDER — AMLODIPINE BESYLATE 10 MG PO TABS
10.0000 mg | ORAL_TABLET | Freq: Every day | ORAL | Status: DC
  Administered 2024-07-15 – 2024-07-16 (×2): 10 mg via ORAL
  Filled 2024-07-15 (×2): qty 1

## 2024-07-15 MED ORDER — GABAPENTIN 300 MG PO CAPS
300.0000 mg | ORAL_CAPSULE | Freq: Every day | ORAL | Status: DC
  Administered 2024-07-15: 300 mg via ORAL
  Filled 2024-07-15: qty 1

## 2024-07-15 MED ORDER — ENOXAPARIN SODIUM 80 MG/0.8ML IJ SOSY
70.0000 mg | PREFILLED_SYRINGE | INTRAMUSCULAR | Status: DC
  Administered 2024-07-15: 70 mg via SUBCUTANEOUS
  Filled 2024-07-15: qty 0.8

## 2024-07-15 MED ORDER — IPRATROPIUM-ALBUTEROL 0.5-2.5 (3) MG/3ML IN SOLN
3.0000 mL | Freq: Three times a day (TID) | RESPIRATORY_TRACT | Status: DC
  Administered 2024-07-15 – 2024-07-16 (×2): 3 mL via RESPIRATORY_TRACT
  Filled 2024-07-15 (×2): qty 3

## 2024-07-15 NOTE — Progress Notes (Signed)
" °   07/15/24 2348  BiPAP/CPAP/SIPAP  $ Non-Invasive Home Ventilator  Subsequent  BiPAP/CPAP/SIPAP Pt Type Adult  BiPAP/CPAP/SIPAP Resmed  Mask Type Nasal mask  Respiratory Rate 20 breaths/min  FiO2 (%) 21 %  Patient Home Machine No  Patient Home Mask No  Patient Home Tubing No  Auto Titrate Yes  Minimum cmH2O 5 cmH2O  Maximum cmH2O 20 cmH2O  Device Plugged into RED Power Outlet Yes  BiPAP/CPAP /SiPAP Vitals  Resp 19  BP (!) 154/93  MEWS Score/Color  MEWS Score 0  MEWS Score Color Green    "

## 2024-07-15 NOTE — Progress Notes (Signed)
 " PROGRESS NOTE    [name]  FMW:[id number] DOB: 07-14-1985 DOA: 07/14/2024 PCP: Joshua Debby CROME, MD  Outpatient Specialists:     Brief Narrative:  As per H&P done on presentation: [name] is a 39 y.o. female with medical history significant of normocytic anemia, thiamine  deficiency, B12 deficiency, vitamin D  deficiency, folate deficiency, depression, generalized anxiety disorder, class III obesity, sleep apnea, history of thrombocytopenia, vasculitis, chronic diastolic heart failure who presented to the emergency department with complaints of progressively worse dyspnea associated with rhinorrhea, sore throat, wheezing, fatigue, malaise, myalgias and arthralgias for the past 2 days.  Last month she was admitted from 06/12/2024 until 06/17/2024 due to acute respiratory failure with hypoxia in the setting of CHF exacerbation and community-acquired pneumonia.  No hemoptysis.  No chest pain, palpitations, diaphoresis, PND, orthopnea, but occasionally has pitting edema of the lower extremities.  No abdominal pain, nausea, emesis, diarrhea, constipation, melena or hematochezia.  No flank pain, dysuria, frequency or hematuria.  No polyuria, polydipsia, polyphagia or blurred vision.    Lab work: CBC showed white count 5.7, hemoglobin 8.1 g/dL with an MCV of 21.9 fL and platelets 123.  Coronavirus PCR was positive, negative influenza A/B and RSV PCR.  Serum pregnancy test was negative.  BMP showed a CO2 of 21 mmol/L with a normal anion gap and calcium of 8.5 mg/dL.  Glucose, renal function and the rest of the electrolytes were normal.  proBNP was 1096 pg/mL.   Imaging: 2 view chest radiograph showing a stable cardiomegaly with no active lung disease.   ED course: Initial vital signs were temperature 98.7 F, pulse 84, respiration 19, BP 166/91 mmHg and O2 sat 99% on room air.  The patient received furosemide  20 mg IVP, 2 DuoNeb's and prednisone  60 mg p.o. x 1.  I added KCl 40 mg p.o.  x 1.  07/15/2024: Patient seen.  Patient gave history of difficulty breathing.  Temperature of 101.8 F is documented.  proBNP of 1096.  Coronavirus PCR was positive.  Chest x-ray revealed stable cardiomegaly.  Patient is on IV dexamethasone  50 mg daily, omeprazole 20 Mg p.o. once daily, 3 input 3 times daily.  Although, Toprol -XL 50 mg p.o. once daily and DuoNeb.  Patient also endorses slowly improving.   Assessment & Plan:   Principal Problem:   COVID-19 virus infection Active Problems:   Vitamin D  deficiency   Primary hypertension   Obstructive sleep apnea syndrome   GAD (generalized anxiety disorder)   Chronic heart failure with preserved ejection fraction (HCC)   Obesity, Class III, BMI 40-49.9 (morbid obesity) (HCC)   Microcytic anemia   Asthma exacerbation   COVID-19 virus infection: Acute on chronic diastolic heart failure: -Continue diuretics. - Supportive care. - Continue IV dexamethasone . - Continue metoprolol , and Aldactone . - Inflammatory markers. - Strict I's and O's. - Cardiac diet. - Further management will depend on hospital course.   -CPAP at bedtime.    Vitamin D  deficiency: Continue weekly vitamin D  supplementation.   Primary hypertension: Continue diuretics and beta-blocker as above. Add amlodipine  10 mg p.o. once daily.   Obstructive sleep apnea syndrome: CPAP at bedtime.   GAD (generalized anxiety disorder) Continue trazodone  and duloxetine .   Obesity, Class III, BMI 40-49.9 (morbid obesity) (HCC): Current BMI 48.35 kg/m. Would benefit from lifestyle modifications. Should follow-up closely with PCP and/or bariatric clinic.   Microcytic anemia: -Stable.   - Continue to monitor -Follow-up with primary care provider.  DVT prophylaxis: Start subcutaneous Lovenox  Code  Status: Full code Family Communication:  Disposition Plan:    Consultants:  None.  Procedures:  None  Antimicrobials:  None   Subjective: Shortness of breath is  improving.  Objective: Vitals:   07/15/24 0830 07/15/24 0845 07/15/24 0900 07/15/24 0915  BP: (!) 146/85  (!) 139/95   Pulse: 81 80 79 80  Resp: 13 14 13 12   Temp:      TempSrc:      SpO2: 100% 99% 100% 100%  Weight:      Height:       No intake or output data in the 24 hours ending 07/15/24 1046 Filed Weights   07/14/24 0948 07/14/24 1635  Weight: (!) 155.7 kg (!) 152.9 kg    Examination:  General exam: Appears calm and comfortable.  Patient is morbidly obese. Respiratory system: Clear to auscultation.  Cardiovascular system: S1 & S2 heard, Gastrointestinal system: Abdomen is morbidly obese, soft and nontender.  Central nervous system: Alert and oriented.  Extremities: Fullness of the ankle  Data Reviewed: I have personally reviewed following labs and imaging studies  CBC: Recent Labs  Lab 07/10/24 1540 07/14/24 1019 07/15/24 0508  WBC 6.8 5.7 5.9  NEUTROABS 4.2  --   --   HGB 9.8* 8.1* 8.2*  HCT 33.1* 29.1* 29.8*  MCV 71.5* 78.0* 77.4*  PLT 200.0 123* 159   Basic Metabolic Panel: Recent Labs  Lab 07/10/24 1540 07/14/24 1019 07/15/24 0508  NA 135 135 135  K 3.6 3.9 4.0  CL 100 103 102  CO2 24 21* 24  GLUCOSE 85 97 103*  BUN 8 15 15   CREATININE 0.54 0.84 0.65  CALCIUM 9.0 8.5* 9.2   GFR: Estimated Creatinine Clearance: 152.5 mL/min (by C-G formula based on SCr of 0.65 mg/dL). Liver Function Tests: Recent Labs  Lab 07/15/24 0508  AST 25  ALT 15  ALKPHOS 95  BILITOT 0.3  PROT 7.9  ALBUMIN 4.0   No results for input(s): LIPASE, AMYLASE in the last 168 hours. No results for input(s): AMMONIA in the last 168 hours. Coagulation Profile: No results for input(s): INR, PROTIME in the last 168 hours. Cardiac Enzymes: No results for input(s): CKTOTAL, CKMB, CKMBINDEX, TROPONINI in the last 168 hours. BNP (last 3 results) Recent Labs    06/16/24 0401 06/21/24 1013 07/14/24 1019  PROBNP 445.0* 635.0* 1,096.0*   HbA1C: No  results for input(s): HGBA1C in the last 72 hours. CBG: No results for input(s): GLUCAP in the last 168 hours. Lipid Profile: No results for input(s): CHOL, HDL, LDLCALC, TRIG, CHOLHDL, LDLDIRECT in the last 72 hours. Thyroid  Function Tests: No results for input(s): TSH, T4TOTAL, FREET4, T3FREE, THYROIDAB in the last 72 hours. Anemia Panel: No results for input(s): VITAMINB12, FOLATE, FERRITIN, TIBC, IRON , RETICCTPCT in the last 72 hours. Urine analysis:    Component Value Date/Time   COLORURINE YELLOW 03/16/2023 1602   APPEARANCEUR CLEAR 03/16/2023 1602   LABSPEC 1.009 03/16/2023 1602   PHURINE 6.0 03/16/2023 1602   GLUCOSEU NEGATIVE 03/16/2023 1602   GLUCOSEU NEGATIVE 02/12/2023 1427   HGBUR NEGATIVE 03/16/2023 1602   BILIRUBINUR NEGATIVE 03/16/2023 1602   BILIRUBINUR NEGATIVE 06/08/2013 1917   KETONESUR NEGATIVE 03/16/2023 1602   PROTEINUR NEGATIVE 03/16/2023 1602   UROBILINOGEN 2.0 (A) 02/12/2023 1427   NITRITE POSITIVE (A) 03/16/2023 1602   LEUKOCYTESUR TRACE (A) 03/16/2023 1602   Sepsis Labs: @LABRCNTIP (procalcitonin:4,lacticidven:4)  ) Recent Results (from the past 240 hours)  Resp panel by RT-PCR (RSV, Flu A&B, Covid) Anterior Nasal Swab  Status: Abnormal   Collection Time: 07/14/24  1:47 PM   Specimen: Anterior Nasal Swab  Result Value Ref Range Status   SARS Coronavirus 2 by RT PCR POSITIVE (A) NEGATIVE Final    Comment: (NOTE) SARS-CoV-2 target nucleic acids are DETECTED.  The SARS-CoV-2 RNA is generally detectable in upper respiratory specimens during the acute phase of infection. Positive results are indicative of the presence of the identified virus, but do not rule out bacterial infection or co-infection with other pathogens not detected by the test. Clinical correlation with patient history and other diagnostic information is necessary to determine patient infection status. The expected result is Negative.  Fact  Sheet for Patients: bloggercourse.com  Fact Sheet for Healthcare Providers: seriousbroker.it  This test is not yet approved or cleared by the [place]  FDA and  has been authorized for detection and/or diagnosis of SARS-CoV-2 by FDA under an Emergency Use Authorization (EUA).  This EUA will remain in effect (meaning this test can be used) for the duration of  the COVID-19 declaration under Section 564(b)(1) of the A ct, 21 U.S.C. section 360bbb-3(b)(1), unless the authorization is terminated or revoked sooner.     Influenza A by PCR NEGATIVE NEGATIVE Final   Influenza B by PCR NEGATIVE NEGATIVE Final    Comment: (NOTE) The Xpert Xpress SARS-CoV-2/FLU/RSV plus assay is intended as an aid in the diagnosis of influenza from Nasopharyngeal swab specimens and should not be used as a sole basis for treatment. Nasal washings and aspirates are unacceptable for Xpert Xpress SARS-CoV-2/FLU/RSV testing.  Fact Sheet for Patients: bloggercourse.com  Fact Sheet for Healthcare Providers: seriousbroker.it  This test is not yet approved or cleared by the [place]  FDA and has been authorized for detection and/or diagnosis of SARS-CoV-2 by FDA under an Emergency Use Authorization (EUA). This EUA will remain in effect (meaning this test can be used) for the duration of the COVID-19 declaration under Section 564(b)(1) of the Act, 21 U.S.C. section 360bbb-3(b)(1), unless the authorization is terminated or revoked.     Resp Syncytial Virus by PCR NEGATIVE NEGATIVE Final    Comment: (NOTE) Fact Sheet for Patients: bloggercourse.com  Fact Sheet for Healthcare Providers: seriousbroker.it  This test is not yet approved or cleared by the [place]  FDA and has been authorized for detection and/or diagnosis of SARS-CoV-2 by FDA under an  Emergency Use Authorization (EUA). This EUA will remain in effect (meaning this test can be used) for the duration of the COVID-19 declaration under Section 564(b)(1) of the Act, 21 U.S.C. section 360bbb-3(b)(1), unless the authorization is terminated or revoked.  Performed at [facility], [address], [place], [place] [address]          Radiology Studies: DG Chest 2 View Result Date: 07/14/2024 CLINICAL DATA:  Shortness of breath and wheezing for 2 days. EXAM: CHEST - 2 VIEW COMPARISON:  06/15/2024 FINDINGS: Stable moderate cardiomegaly. Both lungs are clear. The visualized skeletal structures are unremarkable. IMPRESSION: Stable cardiomegaly. No active lung disease. Electronically Signed   By: Norleen DELENA Kil M.D.   On: 07/14/2024 10:57        Scheduled Meds:  dexamethasone  (DECADRON ) injection  6 mg Intravenous Q24H   ipratropium-albuterol   3 mL Nebulization QID   metoprolol  succinate  50 mg Oral Daily   spironolactone   50 mg Oral Daily   torsemide   20 mg Oral Daily   Continuous Infusions:   LOS: 0 days    Time spent: 55 minutes.  Leatrice Chapel, MD  [facility] 7PM-7AM contact night coverage as above    "

## 2024-07-15 NOTE — Plan of Care (Signed)
" °  Problem: Respiratory: Goal: Will maintain a patent airway Outcome: Progressing   Problem: Clinical Measurements: Goal: Cardiovascular complication will be avoided Outcome: Progressing   Problem: Clinical Measurements: Goal: Respiratory complications will improve Outcome: Progressing   Problem: Nutrition: Goal: Adequate nutrition will be maintained Outcome: Progressing   "

## 2024-07-16 DIAGNOSIS — U071 COVID-19: Secondary | ICD-10-CM | POA: Diagnosis not present

## 2024-07-16 LAB — CBC WITH DIFFERENTIAL/PLATELET
Abs Immature Granulocytes: 0.01 K/uL (ref 0.00–0.07)
Basophils Absolute: 0 K/uL (ref 0.0–0.1)
Basophils Relative: 0 %
Eosinophils Absolute: 0.1 K/uL (ref 0.0–0.5)
Eosinophils Relative: 1 %
HCT: 30.5 % — ABNORMAL LOW (ref 36.0–46.0)
Hemoglobin: 8.7 g/dL — ABNORMAL LOW (ref 12.0–15.0)
Immature Granulocytes: 0 %
Lymphocytes Relative: 27 %
Lymphs Abs: 1.7 K/uL (ref 0.7–4.0)
MCH: 21.7 pg — ABNORMAL LOW (ref 26.0–34.0)
MCHC: 28.5 g/dL — ABNORMAL LOW (ref 30.0–36.0)
MCV: 76.1 fL — ABNORMAL LOW (ref 80.0–100.0)
Monocytes Absolute: 0.4 K/uL (ref 0.1–1.0)
Monocytes Relative: 7 %
Neutro Abs: 4.1 K/uL (ref 1.7–7.7)
Neutrophils Relative %: 65 %
Platelets: 190 K/uL (ref 150–400)
RBC: 4.01 MIL/uL (ref 3.87–5.11)
RDW: 24.5 % — ABNORMAL HIGH (ref 11.5–15.5)
WBC: 6.3 K/uL (ref 4.0–10.5)
nRBC: 0 % (ref 0.0–0.2)

## 2024-07-16 LAB — RENAL FUNCTION PANEL
Albumin: 3.9 g/dL (ref 3.5–5.0)
Anion gap: 11 (ref 5–15)
BUN: 15 mg/dL (ref 6–20)
CO2: 26 mmol/L (ref 22–32)
Calcium: 8.9 mg/dL (ref 8.9–10.3)
Chloride: 99 mmol/L (ref 98–111)
Creatinine, Ser: 0.62 mg/dL (ref 0.44–1.00)
GFR, Estimated: >60 mL/min
Glucose, Bld: 127 mg/dL — ABNORMAL HIGH (ref 70–99)
Phosphorus: 3.7 mg/dL (ref 2.5–4.6)
Potassium: 3.7 mmol/L (ref 3.5–5.1)
Sodium: 136 mmol/L (ref 135–145)

## 2024-07-16 LAB — MAGNESIUM: Magnesium: 2 mg/dL (ref 1.7–2.4)

## 2024-07-16 LAB — SEDIMENTATION RATE: Sed Rate: 15 mm/h (ref 0–22)

## 2024-07-16 MED ORDER — DEXAMETHASONE 6 MG PO TABS: 6.0000 mg | ORAL_TABLET | Freq: Every day | ORAL | 0 refills | Status: AC

## 2024-07-16 MED ORDER — AMLODIPINE BESYLATE 10 MG PO TABS: 10.0000 mg | ORAL_TABLET | Freq: Every day | ORAL | 1 refills | Status: AC

## 2024-07-16 MED ORDER — GABAPENTIN 300 MG PO CAPS: 300.0000 mg | ORAL_CAPSULE | Freq: Every day | ORAL | 1 refills | Status: AC

## 2024-07-16 MED ORDER — ENOXAPARIN SODIUM 80 MG/0.8ML IJ SOSY: 75.0000 mg | PREFILLED_SYRINGE | INTRAMUSCULAR | Status: DC

## 2024-07-16 NOTE — Discharge Summary (Signed)
 Physician Discharge Summary  Patient ID: [name] MRN: [id number] DOB/AGE: 1985-11-26 39 y.o.  Admit date: 07/14/2024 Discharge date: 07/16/2024  Admission Diagnoses:  Discharge Diagnoses:  Principal Problem:   COVID-19 virus infection Active Problems:   Vitamin D  deficiency   Primary hypertension   Obstructive sleep apnea syndrome   GAD (generalized anxiety disorder)   Chronic heart failure with preserved ejection fraction (HCC)   Obesity, Class III, BMI 40-49.9 (morbid obesity) (HCC)   Microcytic anemia   Asthma exacerbation   Discharged Condition: stable  Hospital Course: Patient is a 39 year old female with past medical history significant for normocytic anemia, thiamine  deficiency, B12 deficiency, vitamin D  deficiency, folate deficiency, depression, generalized anxiety disorder, class III obesity, sleep apnea, history of thrombocytopenia, vasculitis, and chronic diastolic heart failure.  Patient presented to the emergency department with 2-day history of progressively worsening dyspnea, associated with rhinorrhea, sore throat, wheezing, fatigue, malaise, myalgias and arthralgias.  Workup revealed white count of 5.7, hemoglobin 8.1 g/dL with an MCV of 21.9 fL and platelets 123. Coronavirus PCR was positive, negative influenza A/B and RSV PCR. Serum pregnancy test was negative. BMP showed a CO2 of 21 mmol/L with a normal anion gap and calcium of 8.5 mg/dL. Glucose, renal function and the rest of the electrolytes were normal. proBNP was 1096 pg/mL.  Chest x-ray revealed stable cardiomegaly with no active lung disease.  Patient was admitted and managed supportively.  Patient was also diuresed.  Patient's symptoms resolved.  Patient will be discharged back home to the care of the primary care provider.  COVID-19 virus infection: Acute on chronic diastolic heart failure: - Management IV diuretics and IV dexamethasone .   - Supportive care. - Continued metoprolol , and  Aldactone . - Monitored inflammatory markers. - Strict I's and O's. - Cardiac diet.   -CPAP at bedtime.    Vitamin D  deficiency: Continue weekly vitamin D  supplementation.   Primary hypertension: Continue diuretics and beta-blocker as above. Added amlodipine  10 mg p.o. once daily.   Obstructive sleep apnea syndrome: CPAP at bedtime.   GAD (generalized anxiety disorder) Continue trazodone  and duloxetine .   Obesity, Class III, BMI 40-49.9 (morbid obesity) (HCC): Current BMI 48.35 kg/m. Patient will benefit from lifestyle modifications. Follow-up closely with PCP and/or bariatric clinic.   Microcytic anemia: -Stable.   - Continue to monitor -Follow-up with primary care provider.  Consults: None  Significant Diagnostic Studies:  Respiratory panel by PCR was positive for SARS Coronavirus 2  CXR revealed: Stable cardiomegaly. No active lung disease    Discharge Exam: Blood pressure (!) 152/83, pulse 89, temperature 98.3 F (36.8 C), temperature source Oral, resp. rate 20, height 5' 10 (1.778 m), weight (!) 155.6 kg, last menstrual period 06/13/2024, SpO2 98%.   Disposition: Discharge disposition: 01-Home or Self Care       Discharge Instructions     Increase activity slowly   Complete by: As directed       Allergies as of 07/16/2024       Reactions   Lyrica  [pregabalin ] Swelling, Rash, Other (See Comments)   Swelling is in the feet        Medication List     STOP taking these medications    nebivolol  10 MG tablet Commonly known as: BYSTOLIC    prazosin 1 MG capsule Commonly known as: MINIPRESS       TAKE these medications    amLODipine  10 MG tablet Commonly known as: NORVASC  Take 1 tablet (10 mg total) by mouth daily. Start taking on:  July 17, 2024   B-12 1000 MCG Tabs Take 1 tablet (1,000 mcg total) by mouth daily.   camphor-menthol  lotion Commonly known as: SARNA Apply topically as needed for itching.   cetirizine  10 MG  tablet Commonly known as: ZYRTEC  Take 1 tablet (10 mg total) by mouth daily.   Cholecalciferol  1.25 MG (50000 UT) capsule Take 1 capsule (50,000 Units total) by mouth once a week.   dexamethasone  6 MG tablet Commonly known as: DECADRON  Take 1 tablet (6 mg total) by mouth daily for 9 days.   DULoxetine  20 MG capsule Commonly known as: CYMBALTA  Take 20 mg by mouth daily as needed.   Dupixent  300 MG/2ML Soaj Generic drug: Dupilumab  Inject 300 mg into the skin every 14 (fourteen) days.   FeroSul 325 (65 Fe) MG tablet Generic drug: ferrous sulfate  Take 1 tablet (325 mg total) by mouth daily with breakfast.   folic acid  1 MG tablet Commonly known as: FOLVITE  Take 1 mg by mouth daily.   gabapentin  300 MG capsule Commonly known as: NEURONTIN  Take 1 capsule (300 mg total) by mouth at bedtime.   Insulin  Pen Needle 32G X 6 MM Misc 1 Act by Does not apply route once a week.   levalbuterol  45 MCG/ACT inhaler Commonly known as: XOPENEX  HFA INHALE 2 PUFFS BY MOUTH INTO THE LUNGS EVERY 6 HOURS AS NEEDED FOR WHEEZE. Patient will need to follow up with PCP for future refills   lidocaine  4 % cream Commonly known as: LMX Apply topically 2 (two) times daily as needed (plaque psoriasis flare pain).   metoprolol  succinate 50 MG 24 hr tablet Commonly known as: Toprol  XL Take 1 tablet (50 mg total) by mouth daily. Take with or immediately following a meal.   Paxlovid  (300/100) 20 x 150 MG & 10 x 100MG  Tbpk Generic drug: nirmatrelvir/ritonavir Take 3 tablets by mouth 2 (two) times daily for 5 days. Patient GFR is > 60. Take nirmatrelvir (150 mg) two tablets twice daily for 5 days and ritonavir (100 mg) one tablet twice daily for 5 days.   potassium chloride  10 MEQ tablet Commonly known as: KLOR-CON  M Take 10 mEq by mouth 2 (two) times daily.   semaglutide -weight management 0.25 MG/0.5ML Soaj SQ injection Commonly known as: WEGOVY  Inject 0.25 mg into the skin once a week for 28 days.    semaglutide -weight management 0.5 MG/0.5ML Soaj SQ injection Commonly known as: WEGOVY  Inject 0.5 mg into the skin once a week for 28 days. Start taking on: August 08, 2024   semaglutide -weight management 1 MG/0.5ML Soaj SQ injection Commonly known as: WEGOVY  Inject 1 mg into the skin once a week for 28 days. Start taking on: September 06, 2024   semaglutide -weight management 1.7 MG/0.75ML Soaj SQ injection Commonly known as: WEGOVY  Inject 1.7 mg into the skin once a week for 28 days. Start taking on: October 05, 2024   semaglutide -weight management 2.4 MG/0.75ML Soaj SQ injection Commonly known as: WEGOVY  Inject 2.4 mg into the skin once a week for 28 days. Start taking on: Nov 03, 2024   spironolactone  50 MG tablet Commonly known as: Aldactone  Take 1 tablet (50 mg total) by mouth daily.   torsemide  20 MG tablet Commonly known as: DEMADEX  Take 1 tablet (20 mg total) by mouth daily.   traZODone  50 MG tablet Commonly known as: DESYREL  Take 0.5 tablets (25 mg total) by mouth at bedtime as needed for sleep.   Vtama 1 % Crea Generic drug: Tapinarof Apply 1 Application topically  daily.        Follow-up Information     Joshua Debby CROME, MD.   Specialty: Internal Medicine Contact information: [address] [place] [address] [phone]                Time spent: 35 Minutes.  SignedBETHA Leatrice LILLETTE Rosario 07/16/2024, 2:18 PM

## 2024-07-16 NOTE — TOC Initial Note (Signed)
 Transition of Care [facility]) - Initial/Assessment Note    Patient Details  Name: [name] MRN: [id number] Date of Birth: [birth year]  Transition of Care [facility]) CM/SW Contact:    Sonda Manuella Quill, RN Phone Number: 07/16/2024, 2:49 PM  Clinical Narrative:                 [name] w/ pt in room; pt said she lives at home w/ family; she plans to return w/ family support at d/c; she identified POC mother Ronal Idol [phone]); she will provide transportation; insurance/PCP verified; pt has walker, BSC, and shower chair; she does not have HH services or home oxygen ; pt will need to schedule her own follow up appt w/ PCP; no IP CM needs.  Expected Discharge Plan: Home/Self Care Barriers to Discharge: No Barriers Identified   Patient Goals and CMS Choice Patient states their goals for this hospitalization and ongoing recovery are:: home          Expected Discharge Plan and Services   Discharge Planning Services: CM Consult   Living arrangements for the past 2 months: Single Family Home Expected Discharge Date: 07/16/24               DME Arranged: N/A DME Agency: NA       HH Arranged: NA HH Agency: NA        Prior Living Arrangements/Services Living arrangements for the past 2 months: Single Family Home Lives with:: Relatives Patient language and need for interpreter reviewed:: Yes Do you feel safe going back to the place where you live?: Yes      Need for Family Participation in Patient Care: Yes (Comment) Care giver support system in place?: Yes (comment) Current home services: DME (wallker, BSC, shower chair) Criminal Activity/Legal Involvement Pertinent to Current Situation/Hospitalization: No - Comment as needed  Activities of Daily Living   ADL Screening (condition at time of admission) Independently performs ADLs?: Yes (appropriate for developmental age) Is the patient deaf or have difficulty hearing?: No Does the patient have difficulty seeing,  even when wearing glasses/contacts?: No Does the patient have difficulty concentrating, remembering, or making decisions?: No  Permission Sought/Granted Permission sought to share information with : Case Manager Permission granted to share information with : Yes, Verbal Permission Granted  Share Information with NAME: Case Manager     Permission granted to share info w Relationship: Ronal Idol (mother) [phone]     Emotional Assessment Appearance:: Appears stated age Attitude/Demeanor/Rapport: Gracious Affect (typically observed): Accepting Orientation: : Oriented to Self, Oriented to Place, Oriented to  Time, Oriented to Situation Alcohol / Substance Use: Not Applicable Psych Involvement: No (comment)  Admission diagnosis:  Mild intermittent asthma with exacerbation [J45.21] Acute on chronic congestive heart failure, unspecified heart failure type (HCC) [I50.9] COVID-19 virus infection [U07.1] COVID-19 [U07.1] Patient Active Problem List   Diagnosis Date Noted   COVID-19 virus infection 07/14/2024   Obesity, Class III, BMI 40-49.9 (morbid obesity) (HCC) 07/14/2024   Microcytic anemia 07/14/2024   Asthma exacerbation 07/14/2024   Anemia 07/10/2024   Thrombocytopenia 07/10/2024   Immunization due 07/10/2024   Vitamin D  deficiency disease 07/10/2024   Diastolic HF (heart failure) (HCC) 06/21/2024   Chronic heart failure with preserved ejection fraction (HCC) 06/21/2024   Psychophysiological insomnia 07/08/2023   Encounter for general adult medical examination with abnormal findings 07/05/2023   LVH (left ventricular hypertrophy) due to hypertensive disease, with heart failure (HCC) 02/12/2023   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 02/05/2023  Screening for cervical cancer 02/03/2023   Encounter for initial prescription of contraceptives 02/03/2023   Intrinsic eczema 07/25/2022   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   GAD (generalized anxiety disorder)  10/27/2021   Hypokalemia 10/01/2021   Asthmatic bronchitis , chronic (HCC) 01/09/2021   Current severe episode of major depressive disorder without psychotic features without prior episode (HCC) 10/17/2020   Dietary folate deficiency anemia 04/11/2020   Primary hypertension 04/10/2020   Obstructive sleep apnea syndrome 04/10/2020   PMS (premenstrual syndrome) 03/17/2018   Primary insomnia 07/28/2017   Morbid obesity with BMI of 50.0-59.9, adult (HCC) 07/28/2017   Chronic zinc  deficiency 06/25/2015   Vitamin D  deficiency 06/20/2013   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   Iron  deficiency anemia 12/04/2011   Severe obesity (BMI >= 40) (HCC) 12/04/2011   PCP:  Joshua Debby CROME, MD Pharmacy:   [phone] [place] [place] - [address] [place] [address] Phone: [phone] Fax: [phone]     Social Drivers of Health (SDOH) Social History: SDOH Screenings   Food Insecurity: No Food Insecurity (07/16/2024)  Housing: Low Risk (07/16/2024)  Transportation Needs: No Transportation Needs (07/16/2024)  Utilities: Not At Risk (07/16/2024)  Alcohol Screen: Low Risk (06/14/2024)  Depression (PHQ2-9): Low Risk (06/21/2024)  Financial Resource Strain: Low Risk (06/14/2024)  Physical Activity: Unknown (02/01/2023)  Social Connections: Patient Declined (06/12/2024)  Stress: Stress Concern Present (02/01/2023)  Tobacco Use: Low Risk (07/14/2024)   SDOH Interventions: Food Insecurity Interventions: Intervention Not Indicated, Inpatient TOC Housing Interventions: Intervention Not Indicated, Inpatient TOC Transportation Interventions: Intervention Not Indicated, Inpatient TOC Utilities Interventions: Intervention Not Indicated, Inpatient TOC   Readmission Risk Interventions     No data to display

## 2024-07-16 NOTE — Plan of Care (Signed)
 " Problem: Education: Goal: Knowledge of risk factors and measures for prevention of condition will improve 07/16/2024 1509 by Rosanne Elspeth HERO, RN Outcome: Adequate for Discharge 07/16/2024 1042 by Rosanne Elspeth HERO, RN Outcome: Progressing   Problem: Coping: Goal: Psychosocial and spiritual needs will be supported 07/16/2024 1509 by Rosanne Elspeth HERO, RN Outcome: Adequate for Discharge 07/16/2024 1042 by Rosanne Elspeth HERO, RN Outcome: Progressing   Problem: Respiratory: Goal: Will maintain a patent airway 07/16/2024 1509 by Rosanne Elspeth HERO, RN Outcome: Adequate for Discharge 07/16/2024 1042 by Rosanne Elspeth HERO, RN Outcome: Progressing Goal: Complications related to the disease process, condition or treatment will be avoided or minimized 07/16/2024 1509 by Rosanne Elspeth HERO, RN Outcome: Adequate for Discharge 07/16/2024 1042 by Rosanne Elspeth HERO, RN Outcome: Progressing   Problem: Education: Goal: Knowledge of General Education information will improve Description: Including pain rating scale, medication(s)/side effects and non-pharmacologic comfort measures 07/16/2024 1509 by Rosanne Elspeth HERO, RN Outcome: Adequate for Discharge 07/16/2024 1042 by Rosanne Elspeth HERO, RN Outcome: Progressing   Problem: Health Behavior/Discharge Planning: Goal: Ability to manage health-related needs will improve 07/16/2024 1509 by Rosanne Elspeth HERO, RN Outcome: Adequate for Discharge 07/16/2024 1042 by Rosanne Elspeth HERO, RN Outcome: Progressing   Problem: Clinical Measurements: Goal: Ability to maintain clinical measurements within normal limits will improve 07/16/2024 1509 by Rosanne Elspeth HERO, RN Outcome: Adequate for Discharge 07/16/2024 1042 by Rosanne Elspeth HERO, RN Outcome: Progressing Goal: Will remain free from infection 07/16/2024 1509 by Rosanne Elspeth HERO, RN Outcome: Adequate for Discharge 07/16/2024 1042 by Rosanne Elspeth HERO, RN Outcome:  Progressing Goal: Diagnostic test results will improve 07/16/2024 1509 by Rosanne Elspeth HERO, RN Outcome: Adequate for Discharge 07/16/2024 1042 by Rosanne Elspeth HERO, RN Outcome: Progressing Goal: Respiratory complications will improve 07/16/2024 1509 by Rosanne Elspeth HERO, RN Outcome: Adequate for Discharge 07/16/2024 1042 by Rosanne Elspeth HERO, RN Outcome: Progressing Goal: Cardiovascular complication will be avoided 07/16/2024 1509 by Rosanne Elspeth HERO, RN Outcome: Adequate for Discharge 07/16/2024 1042 by Rosanne Elspeth HERO, RN Outcome: Progressing   Problem: Activity: Goal: Risk for activity intolerance will decrease 07/16/2024 1509 by Rosanne Elspeth HERO, RN Outcome: Adequate for Discharge 07/16/2024 1042 by Rosanne Elspeth HERO, RN Outcome: Progressing   Problem: Nutrition: Goal: Adequate nutrition will be maintained 07/16/2024 1509 by Rosanne Elspeth HERO, RN Outcome: Adequate for Discharge 07/16/2024 1042 by Rosanne Elspeth HERO, RN Outcome: Progressing   Problem: Coping: Goal: Level of anxiety will decrease 07/16/2024 1509 by Rosanne Elspeth HERO, RN Outcome: Adequate for Discharge 07/16/2024 1042 by Rosanne Elspeth HERO, RN Outcome: Progressing   Problem: Elimination: Goal: Will not experience complications related to bowel motility 07/16/2024 1509 by Rosanne Elspeth HERO, RN Outcome: Adequate for Discharge 07/16/2024 1042 by Rosanne Elspeth HERO, RN Outcome: Progressing Goal: Will not experience complications related to urinary retention 07/16/2024 1509 by Rosanne Elspeth HERO, RN Outcome: Adequate for Discharge 07/16/2024 1042 by Rosanne Elspeth HERO, RN Outcome: Progressing   Problem: Pain Managment: Goal: General experience of comfort will improve and/or be controlled 07/16/2024 1509 by Rosanne Elspeth HERO, RN Outcome: Adequate for Discharge 07/16/2024 1042 by Rosanne Elspeth HERO, RN Outcome: Progressing   Problem: Safety: Goal: Ability to  remain free from injury will improve 07/16/2024 1509 by Rosanne Elspeth HERO, RN Outcome: Adequate for Discharge 07/16/2024 1042 by Rosanne Elspeth HERO, RN Outcome: Progressing   Problem: Skin Integrity: Goal: Risk for impaired skin integrity will decrease 07/16/2024 1509 by Rosanne Elspeth HERO, RN Outcome: Adequate for Discharge 07/16/2024 1042 by [place],  Elspeth HERO, RN Outcome: Progressing   Problem: Education: Goal: Ability to demonstrate management of disease process will improve 07/16/2024 1509 by Rosanne Elspeth HERO, RN Outcome: Adequate for Discharge 07/16/2024 1042 by Rosanne Elspeth HERO, RN Outcome: Progressing Goal: Ability to verbalize understanding of medication therapies will improve 07/16/2024 1509 by Rosanne Elspeth HERO, RN Outcome: Adequate for Discharge 07/16/2024 1042 by Rosanne Elspeth HERO, RN Outcome: Progressing Goal: Individualized Educational Video(s) 07/16/2024 1509 by Rosanne Elspeth HERO, RN Outcome: Adequate for Discharge 07/16/2024 1042 by Rosanne Elspeth HERO, RN Outcome: Progressing   Problem: Activity: Goal: Capacity to carry out activities will improve 07/16/2024 1509 by Rosanne Elspeth HERO, RN Outcome: Adequate for Discharge 07/16/2024 1042 by Rosanne Elspeth HERO, RN Outcome: Progressing   Problem: Cardiac: Goal: Ability to achieve and maintain adequate cardiopulmonary perfusion will improve 07/16/2024 1509 by Rosanne Elspeth HERO, RN Outcome: Adequate for Discharge 07/16/2024 1042 by Rosanne Elspeth HERO, RN Outcome: Progressing   "

## 2024-07-16 NOTE — Procedures (Signed)
 IP CM acknowledges consult for [name] screen; consult for HF Navigation Team previously placed; clearing consult.

## 2024-07-16 NOTE — Plan of Care (Signed)
  Problem: Education: Goal: Knowledge of risk factors and measures for prevention of condition will improve Outcome: Progressing   Problem: Coping: Goal: Psychosocial and spiritual needs will be supported Outcome: Progressing   Problem: Respiratory: Goal: Will maintain a patent airway Outcome: Progressing Goal: Complications related to the disease process, condition or treatment will be avoided or minimized Outcome: Progressing   Problem: Education: Goal: Knowledge of General Education information will improve Description: Including pain rating scale, medication(s)/side effects and non-pharmacologic comfort measures Outcome: Progressing   Problem: Health Behavior/Discharge Planning: Goal: Ability to manage health-related needs will improve Outcome: Progressing   Problem: Clinical Measurements: Goal: Ability to maintain clinical measurements within normal limits will improve Outcome: Progressing Goal: Will remain free from infection Outcome: Progressing Goal: Diagnostic test results will improve Outcome: Progressing Goal: Respiratory complications will improve Outcome: Progressing Goal: Cardiovascular complication will be avoided Outcome: Progressing   Problem: Activity: Goal: Risk for activity intolerance will decrease Outcome: Progressing   Problem: Nutrition: Goal: Adequate nutrition will be maintained Outcome: Progressing   Problem: Coping: Goal: Level of anxiety will decrease Outcome: Progressing   Problem: Elimination: Goal: Will not experience complications related to bowel motility Outcome: Progressing Goal: Will not experience complications related to urinary retention Outcome: Progressing   Problem: Pain Managment: Goal: General experience of comfort will improve and/or be controlled Outcome: Progressing   Problem: Safety: Goal: Ability to remain free from injury will improve Outcome: Progressing   Problem: Skin Integrity: Goal: Risk for impaired  skin integrity will decrease Outcome: Progressing   Problem: Education: Goal: Ability to demonstrate management of disease process will improve Outcome: Progressing Goal: Ability to verbalize understanding of medication therapies will improve Outcome: Progressing Goal: Individualized Educational Video(s) Outcome: Progressing   Problem: Activity: Goal: Capacity to carry out activities will improve Outcome: Progressing   Problem: Cardiac: Goal: Ability to achieve and maintain adequate cardiopulmonary perfusion will improve Outcome: Progressing

## 2024-07-17 ENCOUNTER — Telehealth: Payer: Self-pay

## 2024-07-17 ENCOUNTER — Other Ambulatory Visit (HOSPITAL_COMMUNITY): Payer: Self-pay

## 2024-07-17 NOTE — Telephone Encounter (Signed)
 Patient thinks that she needed the O2 because she had covid and now she is feeling better. But she states that if she feels like her breathing becomes an issue again then she will reach back out to us  schedule an appointment and do the walking test for her O2

## 2024-07-17 NOTE — Telephone Encounter (Signed)
 Unable to reach patient and also unable to [facility]. When she calls back please reach out to me so that I can discuss what she needs to do so that we can get her O2

## 2024-07-17 NOTE — Telephone Encounter (Signed)
 Pharmacy Patient Advocate Encounter   Received notification from CoverMyMeds that prior authorization for Wegovy  0.25mg /0.32ml is required/requested.   Insurance verification completed.   The patient is insured through [facility].   Per test claim: PA required; PA submitted to above mentioned insurance via Latent Key/confirmation #/EOC [id number] Status is pending

## 2024-07-17 NOTE — Telephone Encounter (Signed)
 Patient has been made aware.

## 2024-07-17 NOTE — Transitions of Care (Post Inpatient/ED Visit) (Unsigned)
" ° °  07/17/2024  Name: [name] MRN: [id number] DOB: June 20, 1986  Today's TOC FU Call Status: Today's TOC FU Call Status:: Unsuccessful Call (1st Attempt) Unsuccessful Call (1st Attempt) Date: 07/17/24  Attempted to reach the patient regarding the most recent Inpatient/ED visit.  Follow Up Plan: Additional outreach attempts will be made to reach the patient to complete the Transitions of Care (Post Inpatient/ED visit) call.   Signature  Charmaine Bloodgood, LPN [facility] Health Advisor [place] [facility] You Are. We Are. One [facility] Direct Dial [phone]  "

## 2024-07-17 NOTE — Telephone Encounter (Signed)
 Pharmacy Patient Advocate Encounter  Received notification from [facility] that Prior Authorization for Wegovy  0.25mg /0.28ml has been APPROVED from 07/17/24 to 02/14/25   PA #/Case ID/Reference #: [id number]

## 2024-07-18 ENCOUNTER — Encounter: Payer: Self-pay | Admitting: Internal Medicine

## 2024-07-18 LAB — HIGH SENSITIVITY CRP: CRP, High Sensitivity: 2.63 mg/L (ref 0.00–3.00)

## 2024-07-19 ENCOUNTER — Telehealth: Payer: Self-pay

## 2024-07-19 ENCOUNTER — Ambulatory Visit: Admitting: Internal Medicine

## 2024-07-19 NOTE — Progress Notes (Signed)
 Care Guide Pharmacy Note  07/19/2024 Name: Leonila Speranza MRN: [id number] DOB: [birth year]  Referred By: Joshua Debby CROME, MD Reason for referral: Call Attempt #1 and Complex Care Management (Outreach to schedule referral with pharmacist )   [name] is a 39 y.o. year old female who is a primary care patient of Joshua Debby CROME, MD.  Cherith Rasha Fredrick was referred to the pharmacist for assistance related to: HTN  A third unsuccessful telephone outreach was attempted today to contact the patient who was referred to the pharmacy team for assistance with medication management. The Population Health team is pleased to engage with this patient at any time in the future upon receipt of referral and should he/she be interested in assistance from the Population Health team.  Thedford Franks, CMA, [facility]  [facility], [facility] Guide, Lead Direct Dial: [phone]  Fax: [phone]

## 2024-07-19 NOTE — Telephone Encounter (Signed)
 Copied from CRM [id number]. Topic: Clinical - Prescription Issue >> Jul 19, 2024  2:10 PM Thersia C wrote: Reason for CRM: Patient was in the hospital was discharged and prescribed medication , but went to the pharmacy and they are not there for pick up, patient wants to know if there is anything that Dr.Jones could do to assist her , patient tried to call the hospital back but was unable to get anyone    nirmatrelvir/ritonavir (PAXLOVID , 300/100,) 20 x 150 MG & 10 x 100MG  TBPK  dexamethasone  (DECADRON ) 6 MG tablet

## 2024-07-21 ENCOUNTER — Other Ambulatory Visit: Payer: Self-pay | Admitting: Internal Medicine

## 2024-07-21 NOTE — Telephone Encounter (Signed)
 Please advise if she still needs this medication Dr. Joshua.

## 2024-08-04 ENCOUNTER — Encounter: Payer: Self-pay | Admitting: *Deleted

## 2024-08-04 NOTE — Progress Notes (Signed)
 [name]                                          MRN: [id number]   08/04/2024   The [facility] Quality Team Specialist reviewed this patient medical record for the purposes of chart review for care gap closure. The following were reviewed: chart review for care gap closure-controlling blood pressure.    [facility] Quality Team

## 2024-08-06 ENCOUNTER — Encounter: Payer: Self-pay | Admitting: Internal Medicine

## 2024-08-06 ENCOUNTER — Other Ambulatory Visit: Payer: Self-pay | Admitting: Emergency Medicine

## 2024-08-06 DIAGNOSIS — J4489 Other specified chronic obstructive pulmonary disease: Secondary | ICD-10-CM

## 2024-08-07 ENCOUNTER — Other Ambulatory Visit: Payer: Self-pay | Admitting: Internal Medicine

## 2024-08-08 ENCOUNTER — Other Ambulatory Visit: Payer: Self-pay

## 2024-08-08 DIAGNOSIS — J4489 Other specified chronic obstructive pulmonary disease: Secondary | ICD-10-CM

## 2024-08-08 MED ORDER — LEVALBUTEROL TARTRATE 45 MCG/ACT IN AERO: INHALATION_SPRAY | RESPIRATORY_TRACT | 1 refills | Status: AC

## 2024-08-17 ENCOUNTER — Ambulatory Visit: Admitting: Pharmacist

## 2024-10-10 ENCOUNTER — Ambulatory Visit: Admitting: Internal Medicine
